# Patient Record
Sex: Female | Born: 1990 | Race: White | Hispanic: No | Marital: Married | Smoking: Never smoker
Health system: Southern US, Community
[De-identification: ages and names within clinical notes are randomized; demographics above are authoritative.]

## PROBLEM LIST (undated history)

## (undated) ENCOUNTER — Inpatient Hospital Stay (HOSPITAL_COMMUNITY): Payer: Self-pay

## (undated) ENCOUNTER — Emergency Department (HOSPITAL_COMMUNITY): Admission: EM | Discharge status: Absent without leave | Payer: Medicaid Other

## (undated) ENCOUNTER — Emergency Department (HOSPITAL_BASED_OUTPATIENT_CLINIC_OR_DEPARTMENT_OTHER): Admission: EM | Payer: Medicaid Other | Source: Home / Self Care

## (undated) DIAGNOSIS — K7460 Unspecified cirrhosis of liver: Secondary | ICD-10-CM

## (undated) DIAGNOSIS — R55 Syncope and collapse: Secondary | ICD-10-CM

## (undated) DIAGNOSIS — K219 Gastro-esophageal reflux disease without esophagitis: Secondary | ICD-10-CM

## (undated) DIAGNOSIS — K760 Fatty (change of) liver, not elsewhere classified: Secondary | ICD-10-CM

## (undated) DIAGNOSIS — E039 Hypothyroidism, unspecified: Secondary | ICD-10-CM

## (undated) DIAGNOSIS — J309 Allergic rhinitis, unspecified: Secondary | ICD-10-CM

## (undated) DIAGNOSIS — K449 Diaphragmatic hernia without obstruction or gangrene: Secondary | ICD-10-CM

## (undated) DIAGNOSIS — D696 Thrombocytopenia, unspecified: Secondary | ICD-10-CM

## (undated) DIAGNOSIS — I8500 Esophageal varices without bleeding: Secondary | ICD-10-CM

## (undated) DIAGNOSIS — K766 Portal hypertension: Secondary | ICD-10-CM

## (undated) DIAGNOSIS — K828 Other specified diseases of gallbladder: Secondary | ICD-10-CM

## (undated) DIAGNOSIS — R161 Splenomegaly, not elsewhere classified: Secondary | ICD-10-CM

## (undated) DIAGNOSIS — K2210 Ulcer of esophagus without bleeding: Secondary | ICD-10-CM

## (undated) DIAGNOSIS — Z8489 Family history of other specified conditions: Secondary | ICD-10-CM

## (undated) DIAGNOSIS — F3181 Bipolar II disorder: Secondary | ICD-10-CM

## (undated) DIAGNOSIS — K319 Disease of stomach and duodenum, unspecified: Secondary | ICD-10-CM

## (undated) DIAGNOSIS — Z8759 Personal history of other complications of pregnancy, childbirth and the puerperium: Secondary | ICD-10-CM

## (undated) DIAGNOSIS — R000 Tachycardia, unspecified: Secondary | ICD-10-CM

## (undated) DIAGNOSIS — K922 Gastrointestinal hemorrhage, unspecified: Secondary | ICD-10-CM

## (undated) DIAGNOSIS — R011 Cardiac murmur, unspecified: Secondary | ICD-10-CM

## (undated) DIAGNOSIS — K635 Polyp of colon: Secondary | ICD-10-CM

## (undated) DIAGNOSIS — K7581 Nonalcoholic steatohepatitis (NASH): Secondary | ICD-10-CM

## (undated) HISTORY — PX: DILATION AND CURETTAGE OF UTERUS: SHX78

## (undated) HISTORY — DX: Cardiac murmur, unspecified: R01.1

## (undated) HISTORY — DX: Thrombocytopenia, unspecified: D69.6

## (undated) HISTORY — DX: Syncope and collapse: R55

## (undated) HISTORY — DX: Ulcer of esophagus without bleeding: K22.10

## (undated) HISTORY — DX: Bipolar II disorder: F31.81

## (undated) HISTORY — DX: Tachycardia, unspecified: R00.0

## (undated) HISTORY — DX: Fatty (change of) liver, not elsewhere classified: K76.0

## (undated) HISTORY — DX: Diaphragmatic hernia without obstruction or gangrene: K44.9

## (undated) HISTORY — DX: Nonalcoholic steatohepatitis (NASH): K75.81

## (undated) HISTORY — DX: Gastrointestinal hemorrhage, unspecified: K92.2

## (undated) HISTORY — DX: Disease of stomach and duodenum, unspecified: K31.9

## (undated) HISTORY — PX: COLONOSCOPY: SHX174

## (undated) HISTORY — DX: Personal history of other complications of pregnancy, childbirth and the puerperium: Z87.59

## (undated) HISTORY — DX: Esophageal varices without bleeding: I85.00

## (undated) HISTORY — DX: Unspecified cirrhosis of liver: K74.60

## (undated) HISTORY — PX: WISDOM TOOTH EXTRACTION: SHX21

## (undated) HISTORY — DX: Splenomegaly, not elsewhere classified: R16.1

## (undated) HISTORY — PX: UPPER GASTROINTESTINAL ENDOSCOPY: SHX188

## (undated) HISTORY — DX: Allergic rhinitis, unspecified: J30.9

## (undated) HISTORY — DX: Polyp of colon: K63.5

## (undated) HISTORY — DX: Other specified diseases of gallbladder: K82.8

## (undated) HISTORY — DX: Portal hypertension: K76.6

## (undated) NOTE — *Deleted (*Deleted)
Patient Care Team: Panosh, Standley Brooking, MD as PCP - General (Internal Medicine) Neldon Mc Donnamarie Poag, MD as Consulting Physician (Allergy and Immunology) Adrian Prows, MD as Consulting Physician (Cardiology) Elayne Snare, MD as Consulting Physician (Endocrinology) Pyrtle, [name], MD as Consulting Physician (Gastroenterology) Neldon Mc Donnamarie Poag, MD as Consulting Physician (Allergy and Immunology)  DIAGNOSIS: No diagnosis found.  CHIEF COMPLIANT: Follow-up of thrombocytopenia and splenomegaly  INTERVAL HISTORY: Linda Becker is a 43 y.o. with above-mentioned history of thrombocytopenia and splenomegaly. She presents to the clinic today for follow-up.   ALLERGIES:  is allergic to progesterone and penicillins.  MEDICATIONS:  Current Outpatient Medications  Medication Sig Dispense Refill  . albuterol (VENTOLIN HFA) 108 (90 Base) MCG/ACT inhaler INHALE 2 PUFFS BY MOUTH EVERY 4 TO 6 HOUS AS NEEDED FOR COUGH OR WHEEZE 18 g 0  . albuterol (PROVENTIL) (2.5 MG/3ML) 0.083% nebulizer solution Take 3 mLs (2.5 mg total) by nebulization every 4 (four) hours as needed for wheezing or shortness of breath.    . ARIPiprazole (ABILIFY) 10 MG tablet Take 10 mg by mouth every morning.    . Ascorbic Acid (VITAMIN C WITH ROSE HIPS) 500 MG tablet Take 500 mg by mouth 2 (two) times daily.    . Beclomethasone Dipropionate (QNASL) 80 MCG/ACT AERS INSTILL 1 SPRAY IN EACH NOSTRIL ONCE DAILY AS DIRECTED (Patient taking differently: Place 2 sprays into the nose daily. ) 31.8 g 1  . calcium carbonate (OSCAL) 1500 (600 Ca) MG TABS tablet Take 600 mg of elemental calcium by mouth 2 (two) times daily with a meal.    . Cetirizine HCl 10 MG CAPS Take 10 mg by mouth 2 (two) times daily.     . dupilumab (DUPIXENT) 300 MG/2ML prefilled syringe Inject 300 mg into the skin every 14 (fourteen) days. 4 mL 11  . ferrous sulfate 325 (65 FE) MG tablet Take 325 mg by mouth daily with breakfast.    . FLUoxetine (PROZAC) 40 MG capsule Take 40 mg by  mouth daily.     Marland Kitchen gabapentin (NEURONTIN) 300 MG capsule Take 300 mg by mouth See admin instructions. Take 300 mg in the morning and afternoon, and 600 mg at bedtime    . lamoTRIgine (LAMICTAL) 150 MG tablet Take 300 mg by mouth daily.     Marland Kitchen levothyroxine (SYNTHROID) 88 MCG tablet Take 1 tablet (88 mcg total) by mouth daily before breakfast.    . LORazepam (ATIVAN) 0.5 MG tablet Take 0.5 mg by mouth at bedtime.    . montelukast (SINGULAIR) 10 MG tablet Take 1 tablet (10 mg total) by mouth daily. 90 tablet 1  . nadolol (CORGARD) 20 MG tablet Take 1 tablet (20 mg total) by mouth daily. 30 tablet 3  . Olopatadine HCl 0.2 % SOLN Can use one drop in each eye once daily if needed. (Patient taking differently: Place 1 drop into both eyes daily as needed (allergies). ) 7.5 mL 1  . ondansetron (ZOFRAN ODT) 8 MG disintegrating tablet Take 1 tablet (8 mg total) by mouth every 8 (eight) hours as needed for nausea or vomiting. 30 tablet 1  . ondansetron (ZOFRAN) 8 MG tablet Take 8 mg by mouth daily as needed for nausea/vomiting.    . pantoprazole (PROTONIX) 40 MG tablet Take 1 tablet (40 mg total) by mouth 2 (two) times daily before a meal. 60 tablet 0  . Prenatal Vit-Fe Fumarate-FA (MULTIVITAMIN-PRENATAL) 27-0.8 MG TABS tablet Take 1 tablet by mouth daily.     Marland Kitchen  promethazine (PHENERGAN) 25 MG tablet Take 1 tablet (25 mg total) by mouth every 6 (six) hours as needed for nausea. 30 tablet 2  . SYMBICORT 160-4.5 MCG/ACT inhaler INHALE 2 PUFFS BY MOUTH TWICE A DAY TO PREVENT COUGH OR WHEEZE. RINSE, GARGLE, AND SPIT AFTER USE. (Patient taking differently: Inhale 2 puffs into the lungs See admin instructions. INHALE 2 PUFFS BY MOUTH TWICE A DAY TO PREVENT COUGH OR WHEEZE. RINSE, GARGLE, AND SPIT AFTER USE.) 30.6 g 1  . topiramate (TOPAMAX) 50 MG tablet Take 50 mg by mouth 2 (two) times daily.    . Vitamin D3 (VITAMIN D) 25 MCG tablet Take 1,000 Units by mouth daily.    . vitamin E 400 UNIT capsule Take 400 Units by  mouth 2 (two) times daily.     No current facility-administered medications for this visit.   Facility-Administered Medications Ordered in Other Visits  Medication Dose Route Frequency Provider Last Rate Last Admin  . promethazine (PHENERGAN) tablet 12.5-25 mg  12.5-25 mg Oral Q4H PRN Clovis Riley, MD       Or  . promethazine (PHENERGAN) suppository 12.5-25 mg  12.5-25 mg Rectal Q4H PRN Romana Juniper A, MD      . sodium chloride 0.9 % 1,000 mL with thiamine 758 mg, folic acid 1 mg, multivitamins adult 10 mL infusion   Intravenous Continuous Clovis Riley, MD        PHYSICAL EXAMINATION: ECOG PERFORMANCE STATUS: {CHL ONC ECOG IT:[id number]}  There were no vitals filed for this visit. There were no vitals filed for this visit.  LABORATORY DATA:  I have reviewed the data as listed CMP Latest Ref Rng & Units 04/03/2020 02/24/2020 02/07/2020  Glucose 70 - 99 mg/dL 141(H) 101(H) 112(H)  BUN 6 - 23 mg/dL 5(L) 8 9  Creatinine 0.40 - 1.20 mg/dL 0.70 0.93 0.84  Sodium 135 - 145 mEq/L 141 142 140  Potassium 3.5 - 5.1 mEq/L 3.6 4.1 4.4  Chloride 96 - 112 mEq/L 112 109 111  CO2 19 - 32 mEq/L 23 27 23   Calcium 8.4 - 10.5 mg/dL 8.7 8.9 8.9  Total Protein 6.0 - 8.3 g/dL 6.1 6.7 5.9(L)  Total Bilirubin 0.2 - 1.2 mg/dL 0.5 0.4 0.3  Alkaline Phos 39 - 117 U/L 90 98 129(H)  AST 0 - 37 U/L 19 26 31   ALT 0 - 35 U/L 26 31 47(H)    Lab Results  Component Value Date   WBC 3.3 (L) 04/03/2020   HGB 12.8 04/03/2020   HCT 38.8 04/03/2020   MCV 95.0 04/03/2020   PLT 59.0 (L) 04/03/2020   NEUTROABS 2.0 04/03/2020    ASSESSMENT & PLAN:  No problem-specific Assessment & Plan notes found for this encounter.    No orders of the defined types were placed in this encounter.  The patient has a good understanding of the overall plan. she agrees with it. she will call with any problems that may develop before the next visit here.  Total time spent: *** mins including face to face time and  time spent for planning, charting and coordination of care  Nicholas Lose, MD 04/23/2020  I, Cloyde Reams Dorshimer, am acting as scribe for Dr. Nicholas Lose.  {insert scribe attestation}

---
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
## 2014-06-07 ENCOUNTER — Ambulatory Visit: Payer: Medicaid Other | Admitting: Dietician

## 2014-06-07 ENCOUNTER — Emergency Department (HOSPITAL_COMMUNITY)
Admission: EM | Admit: 2014-06-07 | Discharge: 2014-06-07 | Disposition: A | Discharge status: Home / Self Care | Payer: 59 | Attending: Emergency Medicine | Admitting: Emergency Medicine

## 2014-06-07 ENCOUNTER — Encounter (HOSPITAL_COMMUNITY): Payer: Self-pay

## 2014-06-07 ENCOUNTER — Emergency Department (HOSPITAL_COMMUNITY): Payer: 59

## 2014-06-07 DIAGNOSIS — Z79899 Other long term (current) drug therapy: Secondary | ICD-10-CM | POA: Insufficient documentation

## 2014-06-07 DIAGNOSIS — Y9301 Activity, walking, marching and hiking: Secondary | ICD-10-CM | POA: Insufficient documentation

## 2014-06-07 DIAGNOSIS — J45909 Unspecified asthma, uncomplicated: Secondary | ICD-10-CM | POA: Insufficient documentation

## 2014-06-07 DIAGNOSIS — S90415A Abrasion, left lesser toe(s), initial encounter: Secondary | ICD-10-CM | POA: Diagnosis not present

## 2014-06-07 DIAGNOSIS — G43909 Migraine, unspecified, not intractable, without status migrainosus: Secondary | ICD-10-CM | POA: Insufficient documentation

## 2014-06-07 DIAGNOSIS — K219 Gastro-esophageal reflux disease without esophagitis: Secondary | ICD-10-CM | POA: Diagnosis not present

## 2014-06-07 DIAGNOSIS — Z7951 Long term (current) use of inhaled steroids: Secondary | ICD-10-CM | POA: Insufficient documentation

## 2014-06-07 DIAGNOSIS — S79912A Unspecified injury of left hip, initial encounter: Secondary | ICD-10-CM | POA: Insufficient documentation

## 2014-06-07 DIAGNOSIS — Y92481 Parking lot as the place of occurrence of the external cause: Secondary | ICD-10-CM | POA: Diagnosis not present

## 2014-06-07 DIAGNOSIS — Y998 Other external cause status: Secondary | ICD-10-CM | POA: Insufficient documentation

## 2014-06-07 DIAGNOSIS — Z8781 Personal history of (healed) traumatic fracture: Secondary | ICD-10-CM | POA: Insufficient documentation

## 2014-06-07 DIAGNOSIS — S3992XA Unspecified injury of lower back, initial encounter: Secondary | ICD-10-CM | POA: Insufficient documentation

## 2014-06-07 MED ORDER — HYDROCODONE-ACETAMINOPHEN 5-325 MG PO TABS: 2.0000 | ORAL_TABLET | ORAL | Status: DC | PRN

## 2014-06-07 MED ORDER — HYDROCODONE-ACETAMINOPHEN 5-325 MG PO TABS
2.0000 | ORAL_TABLET | Freq: Once | ORAL | Status: AC
  Administered 2014-06-07: 2 via ORAL
  Filled 2014-06-07: qty 2
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
## 2014-06-07 NOTE — ED Notes (Signed)
Patient transported to X-ray 
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
## 2015-03-17 ENCOUNTER — Telehealth: Payer: Self-pay | Admitting: Family

## 2015-03-17 DIAGNOSIS — J00 Acute nasopharyngitis [common cold]: Secondary | ICD-10-CM

## 2015-03-17 DIAGNOSIS — B9689 Other specified bacterial agents as the cause of diseases classified elsewhere: Secondary | ICD-10-CM

## 2015-03-17 DIAGNOSIS — J208 Acute bronchitis due to other specified organisms: Principal | ICD-10-CM

## 2015-03-17 MED ORDER — BENZONATATE 100 MG PO CAPS: 100.0000 mg | ORAL_CAPSULE | Freq: Two times a day (BID) | ORAL | Status: DC | PRN

## 2015-03-17 MED ORDER — AZITHROMYCIN 250 MG PO TABS: ORAL_TABLET | ORAL | Status: DC

## 2015-03-17 NOTE — Progress Notes (Signed)
We are sorry that you are not feeling well.  Here is how we plan to help!  Based on what you have shared with me it looks like you have upper respiratory tract inflammation that has resulted in a significant cough.  Inflammation and infection in the upper respiratory tract is commonly called bronchitis and has four common causes:  Allergies, Viral Infections, Acid Reflux and Bacterial Infections.  Allergies, viruses and acid reflux are treated by controlling symptoms or eliminating the cause. An example might be a cough caused by taking certain blood pressure medications. You stop the cough by changing the medication. Another example might be a cough caused by acid reflux. Controlling the reflux helps control the cough.  Based on your presentation I believe you most likely have A cough due to bacteria.  When patients have a fever and a productive cough with a change in color or increased sputum production, we are concerned about bacterial bronchitis.  If left untreated it can progress to pneumonia.  If your symptoms do not improve with your treatment plan it is important that you contact your provider.   I have prescribed Azithromyin 250 mg: two tables now and then one tablet daily for 4 additonal days   In addition you may use A non-prescription cough medication called Robitussin DAC. Take 2 teaspoons every 8 hours or Delsym: take 2 teaspoons every 12 hours. and A prescription cough medication called Tessalon Perles 161m. You may take 1-2 capsules every 8 hours as needed for your cough.    HOME CARE . Only take medications as instructed by your medical team. . Complete the entire course of an antibiotic. . Drink plenty of fluids and get plenty of rest. . Avoid close contacts especially the very young and the elderly . Cover your mouth if you cough or cough into your sleeve. . Always remember to wash your hands . A steam or ultrasonic humidifier can help congestion.    GET HELP RIGHT AWAY  IF: . You develop worsening fever. . You become short of breath . You cough up blood. . Your symptoms persist after you have completed your treatment plan MAKE SURE YOU   Understand these instructions.  Will watch your condition.  Will get help right away if you are not doing well or get worse.  Your e-visit answers were reviewed by a board certified advanced clinical practitioner to complete your personal care plan.  Depending on the condition, your plan could have included both over the counter or prescription medications. If there is a problem please reply  once you have received a response from your provider. Your safety is important to [place]  If you have drug allergies check your prescription carefully.    You can use MyChart to ask questions about today's visit, request a non-urgent call back, or ask for a work or school excuse for 24 hours related to this e-Visit. If it has been greater than 24 hours you will need to follow up with your provider, or enter a new e-Visit to address those concerns. You will get an e-mail in the next two days asking about your experience.  I hope that your e-visit has been valuable and will speed your recovery. Thank you for using e-visits.

## 2015-03-29 ENCOUNTER — Other Ambulatory Visit: Payer: Self-pay | Admitting: Allergy and Immunology

## 2015-03-29 NOTE — Telephone Encounter (Signed)
Denied Ventolin HFA.  Was refilled on 12/27/2014 and told needs an office visit.  This was entered in our old office [place].

## 2015-04-18 ENCOUNTER — Ambulatory Visit (INDEPENDENT_AMBULATORY_CARE_PROVIDER_SITE_OTHER): Payer: 59 | Admitting: Allergy and Immunology

## 2015-04-18 ENCOUNTER — Encounter: Payer: Self-pay | Admitting: Allergy and Immunology

## 2015-04-18 VITALS — BP 104/78 | HR 88 | Resp 20 | Ht 66.14 in | Wt 239.4 lb

## 2015-04-18 DIAGNOSIS — J309 Allergic rhinitis, unspecified: Secondary | ICD-10-CM | POA: Diagnosis not present

## 2015-04-18 DIAGNOSIS — I499 Cardiac arrhythmia, unspecified: Secondary | ICD-10-CM | POA: Diagnosis not present

## 2015-04-18 DIAGNOSIS — H1010 Acute atopic conjunctivitis, unspecified eye: Secondary | ICD-10-CM

## 2015-04-18 DIAGNOSIS — K219 Gastro-esophageal reflux disease without esophagitis: Secondary | ICD-10-CM

## 2015-04-18 DIAGNOSIS — J4530 Mild persistent asthma, uncomplicated: Secondary | ICD-10-CM

## 2015-04-18 MED ORDER — MONTELUKAST SODIUM 10 MG PO TABS: 10.0000 mg | ORAL_TABLET | Freq: Every day | ORAL | Status: DC

## 2015-04-18 MED ORDER — ALBUTEROL SULFATE HFA 108 (90 BASE) MCG/ACT IN AERS: INHALATION_SPRAY | RESPIRATORY_TRACT | Status: DC

## 2015-04-18 MED ORDER — MOMETASONE FUROATE 50 MCG/ACT NA SUSP: NASAL | Status: DC

## 2015-04-18 MED ORDER — ESOMEPRAZOLE MAGNESIUM 40 MG PO CPDR: 40.0000 mg | DELAYED_RELEASE_CAPSULE | Freq: Every day | ORAL | Status: DC

## 2015-04-18 MED ORDER — FLUTICASONE PROPIONATE (INHAL) 250 MCG/BLIST IN AEPB: INHALATION_SPRAY | RESPIRATORY_TRACT | Status: DC

## 2015-04-18 NOTE — Progress Notes (Signed)
[facility]  Follow-up Note  Refering Provider: Shon Baton, MD Primary Provider: Precious Reel, MD  Subjective:   Linda Becker is a 24 y.o. female who returns to the [facility] [employer] in re-evaluation of the following:  HPI Comments:  [name] returns to this clinic stating that she's done quite well over the course of the past year regarding her asthma and allergic rhinoconjunctivitis. She is not had an exacerbation of her asthma requiring her to get systemic steroids. She rarely uses a short acting bronchodilator as long she continues on Advair 250/50 one inhalation twice a day and singular 10 mg daily. Her nose is been doing well on occasional Nasonex. She did receive the flu vaccine.  [name] is undergoing evaluation for some type of arrhythmia. Apparently she's had episodes of chest pain and fast heart rate and shortness of breath and near syncope. She is having a heart monitor placed. She drinks 3 cups of coffee per day and tea throughout the day.   Outpatient Encounter Prescriptions as of 04/18/2015  Medication Sig  . albuterol (PROAIR HFA) 108 (90 BASE) MCG/ACT inhaler Inhale two puffs every four to six hours as needed for cough or wheeze.  Marland Kitchen albuterol (PROVENTIL) (2.5 MG/3ML) 0.083% nebulizer solution Take 2.5 mg by nebulization every 6 (six) hours as needed for wheezing or shortness of breath.  . cetirizine (ZYRTEC) 10 MG tablet Take 10 mg by mouth daily.  Marland Kitchen esomeprazole (NEXIUM) 40 MG capsule Take 40 mg by mouth daily at 12 noon.  . Fluticasone-Salmeterol (ADVAIR) 250-50 MCG/DOSE AEPB Inhale 1 puff into the lungs every 12 (twelve) hours.  . mometasone (NASONEX) 50 MCG/ACT nasal spray Place 2 sprays into the nose as needed.  . montelukast (SINGULAIR) 10 MG tablet Take 10 mg by mouth at bedtime.  . Multiple Vitamin (MULTIVITAMIN) tablet Take 1 tablet by mouth daily.  . Olopatadine HCl (PATADAY) 0.2 % SOLN  Apply 1 drop to eye 2 (two) times daily.  Marland Kitchen topiramate (TOPAMAX) 25 MG tablet take 1 in the a.m, 3 at night. (Patient taking differently: Take 50-75 mg by mouth 2 (two) times daily. Take 2 in the a.m, 3 at night.)  . Vitamin D, Ergocalciferol, (DRISDOL) 50000 UNITS CAPS capsule 2 (two) times a week.  . [DISCONTINUED] albuterol (PROVENTIL HFA;VENTOLIN HFA) 108 (90 BASE) MCG/ACT inhaler Inhale 2 puffs into the lungs every 4 (four) hours as needed. For wheezing  . azithromycin (ZITHROMAX) 250 MG tablet Take 2 tablets now, then 1 tablet daily x 4 days (Patient not taking: Reported on 04/18/2015)  . benzonatate (TESSALON) 100 MG capsule Take 1-2 capsules (100-200 mg total) by mouth 2 (two) times daily as needed for cough. (Patient not taking: Reported on 04/18/2015)  . Biotin (BIOTIN MAXIMUM STRENGTH) 10 MG TABS Take 1 tablet by mouth 2 (two) times daily.  Marland Kitchen dexamethasone (DECADRON) 2 MG tablet take 2 twice a day for 4 days, then 1 twice a day for 4 days then stop. (Patient not taking: Reported on 06/07/2014)  . HYDROcodone-acetaminophen (NORCO/VICODIN) 5-325 MG per tablet Take 2 tablets by mouth every 4 (four) hours as needed for moderate pain. (Patient not taking: Reported on 04/18/2015)  . ibuprofen (ADVIL,MOTRIN) 200 MG tablet Take 600-800 mg by mouth every 6 (six) hours as needed. For pain  . medroxyPROGESTERone (DEPO-PROVERA) 150 MG/ML injection Inject 150 mg into the muscle every 3 (three) months.  . predniSONE (DELTASONE) 10 MG tablet 6,5,4,3,2,1 taper (Patient  not taking: Reported on 06/07/2014)  . [DISCONTINUED] rizatriptan (MAXALT-MLT) 5 MG disintegrating tablet Take 1 tablet (5 mg total) by mouth as needed for migraine. Take one at the onset of the HA. May repeat in 2 hours if needed.   No facility-administered encounter medications on file as of 04/18/2015.    No orders of the defined types were placed in this encounter.    Past Medical History  Diagnosis Date  . Asthma   . GERD  (gastroesophageal reflux disease)   . Migraine     Past Surgical History  Procedure Laterality Date  . Broken right femur  2008    rod placed  . Tonsillectomy  2006    No Known Allergies  Review of Systems  Constitutional: Negative for fever and chills.  HENT: Negative for congestion, ear pain, facial swelling, nosebleeds, postnasal drip, rhinorrhea, sinus pressure, sneezing, sore throat, tinnitus, trouble swallowing and voice change.   Eyes: Negative for pain, discharge, redness and itching.  Respiratory: Negative for cough, choking, chest tightness, shortness of breath, wheezing and stridor.   Cardiovascular: Positive for chest pain. Negative for leg swelling.  Gastrointestinal: Negative for nausea, vomiting and abdominal pain.  Endocrine: Negative for cold intolerance and heat intolerance.  Genitourinary: Negative for difficulty urinating.  Musculoskeletal: Negative for myalgias and arthralgias.  Allergic/Immunologic: Negative.   Neurological: Negative for dizziness.  Hematological: Negative for adenopathy.     Objective:   Filed Vitals:   04/18/15 0917  BP: 104/78  Pulse: 88  Resp: 20   Height: 5' 6.14" (168 cm)  Weight: 239 lb 6.7 oz (108.6 kg)   Physical Exam  Constitutional: She appears well-developed and well-nourished. No distress.  HENT:  Head: Normocephalic and atraumatic. Head is without right periorbital erythema and without left periorbital erythema.  Right Ear: Tympanic membrane, external ear and ear canal normal. No drainage or tenderness. No foreign bodies. Tympanic membrane is not injected, not scarred, not perforated, not erythematous, not retracted and not bulging. No middle ear effusion.  Left Ear: Tympanic membrane, external ear and ear canal normal. No drainage or tenderness. No foreign bodies. Tympanic membrane is not injected, not scarred, not perforated, not erythematous, not retracted and not bulging.  No middle ear effusion.  Nose: Nose  normal. No mucosal edema, rhinorrhea, nose lacerations or sinus tenderness.  No foreign bodies.  Mouth/Throat: Oropharynx is clear and moist. No oropharyngeal exudate, posterior oropharyngeal edema, posterior oropharyngeal erythema or tonsillar abscesses.  Eyes: Lids are normal. Right eye exhibits no chemosis, no discharge and no exudate. No foreign body present in the right eye. Left eye exhibits no chemosis, no discharge and no exudate. No foreign body present in the left eye. Right conjunctiva is not injected. Left conjunctiva is not injected.  Neck: Neck supple. No tracheal tenderness present. No tracheal deviation and no edema present. No thyroid mass and no thyromegaly present.  Cardiovascular: Normal rate, regular rhythm, S1 normal and S2 normal.  Exam reveals no gallop.   No murmur heard. Pulmonary/Chest: No accessory muscle usage or stridor. No respiratory distress. She has no wheezes. She has no rhonchi. She has no rales.  Abdominal: Soft. There is no hepatosplenomegaly. There is no tenderness. There is no rigidity, no rebound and no guarding.  Lymphadenopathy:       Head (right side): No tonsillar adenopathy present.       Head (left side): No tonsillar adenopathy present.    She has no cervical adenopathy.  Neurological: She is alert.  Skin:  No rash noted. She is not diaphoretic.  Psychiatric: She has a normal mood and affect. Her behavior is normal.    Diagnostics:    Spirometry was performed and demonstrated an FEV1 of 3.67 at 110 % of predicted.  The patient had an Asthma Control Test with the following results: ACT Total Score: 19.    Assessment and Plan:   1. Mild persistent asthma, uncomplicated   2. Allergic rhinoconjunctivitis   3. Gastroesophageal reflux disease, esophagitis presence not specified   4. Cardiac arrhythmia, unspecified cardiac arrhythmia type      1. Change Advair to Flovent 250 one inhalation two times per day  2. Taper down caffeine and  chocolate slowly- aim for least amount possible  3. Continue montelukast 30m one tablet one time per day  4. Continue Nexium 40 mg one tablet one time per day  5. Continue Nasonex 1-2 sprays each nostril one time per day  6. Continue ProAir HFA 2 inhalations every 4-6 hours of needed.  7. Return in one year or earlier if problem.   I'm going to remove Laryssa's long-acting bronchodilator and have her taper down her caffeine consumption given the fact that she is being evaluated for an arrhythmia that may be associated with syncope. Hopefully she'll do well using Flovent instead of Advair. She will keep in contact with me noting her response to this treatment. If she does well I'll see her back in this clinic in 1 year or earlier if there is a problem.   EAllena Katz MD [facility]

## 2015-04-18 NOTE — Patient Instructions (Signed)
  1. Change Advair to Flovent 250 one inhalation two times per day  2. Taper down caffeine and chocolate slowly- aim for least amount possible  3. Continue montelukast 69m one tablet one time per day  4. Continue Nexium 40 mg one tablet one time per day  5. Continue Nasonex 1-2 sprays each nostril one time per day  6. Continue ProAir HFA 2 inhalations every 4-6 hours of needed.  7. Return in one year or earlier if problem.

## 2015-06-18 MED FILL — MONTELUKAST SOD 10 MG TAB: 10 | 30 days supply | Qty: 30 | Fill #1

## 2015-06-18 MED FILL — VENTOLIN HFA 90 MCG INHALER: 108 (90 BAS | 25 days supply | Qty: 18 | Fill #1

## 2015-06-18 MED FILL — FLOVENT 250 MCG DISKUS: 250 | 30 days supply | Qty: 60 | Fill #0

## 2015-07-04 MED FILL — HYDROCODON-APAP 5-325: 5-325 | 2 days supply | Qty: 20 | Fill #0

## 2015-07-06 MED FILL — AMOXICILLIN 500 MG CAPSULE: 500 | 7 days supply | Qty: 28 | Fill #0

## 2015-07-06 MED FILL — HYDROCODON-APAP 5-325: 5-325 | 2 days supply | Qty: 20 | Fill #0

## 2015-07-09 MED FILL — CHLORHEXIDINE 0.12% RINSE: 0.12 | 17 days supply | Qty: 473 | Fill #0

## 2015-07-09 MED FILL — HYDROCODON-APAP 5-325: 5-325 | 2 days supply | Qty: 20 | Fill #0

## 2015-07-09 MED FILL — CLINDAMYCIN HCL 150 MG CAPS: 150 | 6 days supply | Qty: 24 | Fill #0

## 2015-07-11 MED FILL — VENTOLIN HFA 90 MCG INHALER: 108 (90 BAS | 21 days supply | Qty: 18 | Fill #2

## 2015-07-25 MED FILL — FLOVENT 250 MCG DISKUS: 250 | 30 days supply | Qty: 60 | Fill #1

## 2015-07-25 MED FILL — VENTOLIN HFA 90 MCG INHALER: 108 (90 BAS | 21 days supply | Qty: 18 | Fill #3

## 2015-07-25 MED FILL — MONTELUKAST SOD 10 MG TAB: 10 | 90 days supply | Qty: 90 | Fill #2

## 2015-08-15 DIAGNOSIS — J45901 Unspecified asthma with (acute) exacerbation: Secondary | ICD-10-CM | POA: Diagnosis not present

## 2015-08-15 DIAGNOSIS — J40 Bronchitis, not specified as acute or chronic: Secondary | ICD-10-CM | POA: Diagnosis not present

## 2015-08-21 ENCOUNTER — Other Ambulatory Visit: Payer: Self-pay | Admitting: Allergy and Immunology

## 2015-08-21 MED FILL — VENTOLIN HFA 90 MCG INHALER: 108 (90 BAS | 21 days supply | Qty: 18 | Fill #0

## 2015-08-21 NOTE — Progress Notes (Signed)
Chief Complaint  Patient presents with  . Establish Care    HPI: Patient  Linda Becker  25 y.o. comes in today fornew patient Health Care visit  Previous care :\Dr Catha Gosselin medical :   Pass  Out  At times  ? If related  To  advair  So changed to flovent  And not as good for asthma  Saw dr Einar Gip  And did heart monitor.   No dx sinius tachy   About  Every 3 months  . No restriction... .   initally dec driving.  Not now  goes to school   Hx of low bp.  About once a twice a month   Lays down.   gerd since young   Taking nexium .  Supplement .     Just got rx for poss early pna resp infection with z pack and prednisone  Just finished today  No fever some congestion not allergic to antibiotics? No c xray Health Maintenance  Topic Date Due  . CHLAMYDIA SCREENING  11/10/2007  . HIV Screening  11/10/2007  . TETANUS/TDAP  11/10/2011  . PAP SMEAR  11/09/2013  . INFLUENZA VACCINE  01/15/2015   Health Maintenance Review LIFESTYLE:  Exercise:  Time no Tobacco/ETS: no Alcohol: per day  ocass rare Sugar beverages:  Lots of sodas and sweet .   4-5 sugar  With caffiene.  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    Mom cva ht   Both parents  Have diabetes g1 p0    Used to see [facility]  With [facility] on depoprovera   8 weeks loss . March 29    Periods irreg and heavy . Appt on march 16th .for new gyne.S [facility]   ROS:  GEN/ HEENT: No fever, significant weight changes sweats gets headaches vision problems hearing changes, CV/ PULM; No chest pain shortness of breath cough, syncope as above ,no,edema  change in exercise tolerance. GI /GU: No adominal pain, vomiting, change in bowel habits. No blood in the stool. No significant GU symptoms. SKIN/HEME: ,no acute skin rashes suspicious lesions or bleeding. No lymphadenopathy, nodules, masses.  NEURO/ PSYCH:  No neurologic signs such as  weakness numbness. No depression anxiety. IMM/ Allergy: No unusual infections.  Allergy .   REST of 12 system review negative except as per HPI   Past Medical History  Diagnosis Date  . Asthma     since childhood  on controller meds extrinsic dr Carmelina Peal  . GERD (gastroesophageal reflux disease)     on nexium  for  long term sx since childhood  . Migraine   . Tachycardia     episodes with near syncope eval dr Nadyne Coombes  on no meds dced LABA  . Murmur     pt reports MVP  . Syncope     under eval ? cause  . H/O miscarriage, not currently pregnant     [redacted] weeks  march 2016  . Allergic rhinitis     remote hx of allergy shots    Past Surgical History  Procedure Laterality Date  . Broken right femur  2008    rod placed  . Tonsillectomy  2006  . Wisdom tooth extraction      Family History  Problem Relation Age of Onset  . Asthma Father   . Arthritis Mother   .  Arthritis Father   . Arthritis Maternal Grandmother   . Colon cancer Paternal Grandfather   . Breast cancer Maternal Grandmother   . Breast cancer Paternal Grandmother   . Breast cancer Paternal Aunt   . Breast cancer Paternal Aunt   . Hyperlipidemia Father   . Hyperlipidemia Mother   . Heart disease Mother   . Heart disease Father   . Hypertension Mother   . Hypertension Father   . Diabetes Mother   . Diabetes Father   . Diabetes Maternal Grandfather   . Diabetes Paternal Grandmother   . Diabetes Paternal Grandfather     Social History   Social History  . Marital Status: Legally Separated    Spouse Name: N/A  . Number of Children: N/A  . Years of Education: N/A   Social History Main Topics  . Smoking status: Never Smoker   . Smokeless tobacco: Never Used  . Alcohol Use: 0.0 oz/week    0 Standard drinks or equivalent per week  . Drug Use: No  . Sexual Activity: No   Other Topics Concern  . 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 albuterol (PROVENTIL) (2.5 MG/3ML) 0.083% nebulizer solution Take 2.5 mg by nebulization every 6 (six) hours as needed for wheezing or shortness of breath.    . Biotin (BIOTIN MAXIMUM STRENGTH) 10 MG TABS Take 1 tablet by mouth 2 (two) times daily.    . cetirizine (ZYRTEC) 10 MG tablet Take 10 mg by mouth daily.    Marland Kitchen esomeprazole (NEXIUM) 40 MG capsule Take 1 capsule (40 mg total) by mouth daily. 30 capsule 11  . Fluticasone Propionate, Inhal, (FLOVENT DISKUS) 250 MCG/BLIST AEPB Inhale one dose twice a day.  Rinse, gargle, and spit after use. 60 each 11  . ibuprofen (ADVIL,MOTRIN) 200 MG tablet Take 600-800 mg by mouth every 6 (six) hours as needed. For pain    . mometasone (NASONEX) 50 MCG/ACT nasal spray Use one to two sprays in each nostril once daily. 17 g 11  . montelukast (SINGULAIR) 10 MG tablet Take 1 tablet (10 mg total) by mouth daily. 30 tablet 11  . Multiple Vitamin (MULTIVITAMIN) tablet Take 1 tablet by mouth daily.    . Olopatadine HCl (PATADAY) 0.2 % SOLN Apply 1 drop to eye 2 (two) times daily.    . VENTOLIN HFA 108 (90 Base) MCG/ACT inhaler INHALE 2 PUFFS BY MOUTH EVERY 4-6 HOURS AS NEEDED FOR COUGH OR WHEEZE 18 g 1  . Vitamin D, Ergocalciferol, (DRISDOL) 50000 UNITS CAPS capsule 2 (two) times a week.  1  . azithromycin (ZITHROMAX) 250 MG tablet Take 2 tablets now, then 1 tablet daily x 4 days (Patient not taking: Reported on 04/18/2015) 6 tablet 0  . benzonatate (TESSALON) 100 MG capsule Take 1-2 capsules (100-200 mg total) by mouth 2 (two) times daily as needed for cough. (Patient not taking: Reported on 04/18/2015) 30 capsule 0  . dexamethasone (DECADRON) 2 MG tablet take 2 twice a day for 4 days, then 1 twice a day for 4 days then stop. (Patient not taking: Reported on  06/07/2014) 24  tablet 0  . Fluticasone-Salmeterol (ADVAIR) 250-50 MCG/DOSE AEPB Inhale 1 puff into the lungs every 12 (twelve) hours.    Marland Kitchen HYDROcodone-acetaminophen (NORCO/VICODIN) 5-325 MG per tablet Take 2 tablets by mouth every 4 (four) hours as needed for moderate pain. (Patient not taking: Reported on 04/18/2015) 20 tablet 0  . medroxyPROGESTERone (DEPO-PROVERA) 150 MG/ML injection Inject 150 mg into the muscle every 3 (three) months.    . predniSONE (DELTASONE) 10 MG tablet 6,5,4,3,2,1 taper (Patient not taking: Reported on 06/07/2014) 21 tablet 0  . topiramate (TOPAMAX) 25 MG tablet take 1 in the a.m, 3 at night. (Patient taking differently: Take 50-75 mg by mouth 2 (two) times daily. Take 2 in the a.m, 3 at night.) 120 tablet 3   No facility-administered medications prior to visit.     EXAM:  BP 106/80 mmHg  Temp(Src) 97.6 F (36.4 C) (Oral)  Ht 5' 5.5" (1.664 m)  Wt 256 lb (116.121 kg)  BMI 41.94 kg/m2  Body mass index is 41.94 kg/(m^2).  Physical Exam: Vital signs reviewed AOZ:HYQM is a well-developed well-nourished alert cooperative    who appearsr stated age in no acute distress.  congested  No cough  Pleasant nontoxic looks tired   Central obesity  No stria obvious HEENT: normocephalic atraumatic , Eyes: PERRL EOM's full, conjunctiva clear, Nares: stuffy  no deformity discharge or tenderness., Ears: no deformity EAC's clear TMs with normal landmarks. Mouth: clear OP, no lesions, edema.  Moist mucous membranes. Dentition in adequate repair. NECK: supple without masses, thyromegaly or bruits. CHEST/PULM:  Clear to auscultation and percussion breath sounds equal no wheeze , rales or rhonchi. CV: PMI is nondisplaced, S1 S2 no gallops, murmurs, rubs. Peripheral pulses are full without delay..  ABDOMEN: Bowel sounds normal nontender  No guard or rebound, no hepato splenomegal no CVA tenderness.   Extremtities:  No clubbing cyanosis or edema, no acute joint swelling or redness no  focal atrophy NEURO:  Oriented x3, cranial nerves 3-12 appear to be intact, no obvious focal weakness,gait within normal limits  SKIN: No acute rashes normal turgor, color, no bruising or petechiae. PSYCH: Oriented, good eye contact, no obvious depression anxiety, cognition and judgment appear normal. LN: no cervical  adenopathy  No results found for: WBC, HGB, HCT, PLT, GLUCOSE, CHOL, TRIG, HDL, LDLDIRECT, LDLCALC, ALT, AST, NA, K, CL, CREATININE, BUN, CO2, TSH, PSA, INR, GLUF, HGBA1C, MICROALBUR  ASSESSMENT AND PLAN:  Discussed the following assessment and plan:  Tachycardia - Plan: Basic metabolic panel, CBC with Differential/Platelet, Hemoglobin A1c, Hepatic function panel, Lipid panel, TSH, T4, free, Insulin, fasting  Family history of diabetes mellitus (DM) - both parents and other relatives - Plan: Basic metabolic panel, CBC with Differential/Platelet, Hemoglobin A1c, Hepatic function panel, Lipid panel, TSH, T4, free, Insulin, fasting  Gastroesophageal reflux disease, esophagitis presence not specified - on ppi would hope to have her get off in future for metabolic  reasong  will disc in future visits - Plan: Basic metabolic panel, CBC with Differential/Platelet, Hemoglobin A1c, Hepatic function panel, Lipid panel, TSH, T4, free, Insulin, fasting  Syncope, unspecified syncope type - Plan: Basic metabolic panel, CBC with Differential/Platelet, Hemoglobin A1c, Hepatic function panel, Lipid panel, TSH, T4, free, Insulin, fasting  Allergic rhinitis, unspecified allergic rhinitis type  Persistent asthma with undetermined severity - Plan: Basic metabolic panel, CBC with Differential/Platelet, Hemoglobin A1c, Hepatic function panel, Lipid panel, TSH, T4, free, Insulin, fasting  Encounter to establish care  BMI 40.0-44.9, adult ([place]) - Plan: Basic metabolic panel, CBC  with Differential/Platelet, Hemoglobin A1c, Hepatic function panel, Lipid panel, TSH, T4, free, Insulin, fasting Strong  family hx dm and has high sugar drink load as well as caffeine cause of  Work school uncertain if not sleep deprived although was sick recently. Plan fasting labs  She is to dec her sugar beverages  Intensify lifestyle interventions. To help with the majority of her above conditions .  Get records   Cardiology .   Recurrent syncope  And light headed is problematic but she will lay down when feels it coming  No injury reported  Patient Care Team: Burnis Medin, MD as PCP - General (Internal Medicine) Jiles Prows, MD as Consulting Physician (Allergy and Immunology) Patient Instructions  Get records   from dr Einar Gip.cardiologist .    For our records .  And gyne callahan.  Try to get better blocks of sleep .  Decrease or elimated sugar drinks     caffiene  in  Moderation Get appt for fasting labs  I will put in orders .  Plan fu depending on results and how your are doing.       Food Choices for Gastroesophageal Reflux Disease, Adult When you have gastroesophageal reflux disease (GERD), the foods you eat and your eating habits are very important. Choosing the right foods can help ease the discomfort of GERD. WHAT GENERAL GUIDELINES DO I NEED TO FOLLOW?  Choose fruits, vegetables, whole grains, low-fat dairy products, and low-fat meat, fish, and poultry.  Limit fats such as oils, salad dressings, butter, nuts, and avocado.  Keep a food diary to identify foods that cause symptoms.  Avoid foods that cause reflux. These may be different for different people.  Eat frequent small meals instead of three large meals each day.  Eat your meals slowly, in a relaxed setting.  Limit fried foods.  Cook foods using methods other than frying.  Avoid drinking alcohol.  Avoid drinking large amounts of liquids with your meals.  Avoid bending over or lying down until 2-3 hours after eating. WHAT FOODS ARE NOT RECOMMENDED? The following are some foods and drinks that may worsen your  symptoms: Vegetables Tomatoes. Tomato juice. Tomato and spaghetti sauce. Chili peppers. Onion and garlic. Horseradish. Fruits Oranges, grapefruit, and lemon (fruit and juice). Meats High-fat meats, fish, and poultry. This includes hot dogs, ribs, ham, sausage, salami, and bacon. Dairy Whole milk and chocolate milk. Sour cream. Cream. Butter. Ice cream. Cream cheese.  Beverages Coffee and tea, with or without caffeine. Carbonated beverages or energy drinks. Condiments Hot sauce. Barbecue sauce.  Sweets/Desserts Chocolate and cocoa. Donuts. Peppermint and spearmint. Fats and Oils High-fat foods, including [place] fries and potato chips. Other Vinegar. Strong spices, such as black pepper, white pepper, red pepper, cayenne, curry powder, cloves, ginger, and chili powder. The items listed above may not be a complete list of foods and beverages to avoid. Contact your dietitian for more information.   This information is not intended to replace advice given to you by your health care provider. Make sure you discuss any questions you have with your health care provider.   Document Released: 06/02/2005 Document Revised: 06/23/2014 Document Reviewed: 04/06/2013 Elsevier Interactive Patient Education 2016 [place] K. Delphine Sizemore M.D.

## 2015-08-22 ENCOUNTER — Encounter: Payer: Self-pay | Admitting: Internal Medicine

## 2015-08-22 ENCOUNTER — Ambulatory Visit (INDEPENDENT_AMBULATORY_CARE_PROVIDER_SITE_OTHER): Payer: 59 | Admitting: Internal Medicine

## 2015-08-22 VITALS — BP 106/80 | Temp 97.6°F | Ht 65.5 in | Wt 256.0 lb

## 2015-08-22 DIAGNOSIS — Z7689 Persons encountering health services in other specified circumstances: Secondary | ICD-10-CM

## 2015-08-22 DIAGNOSIS — Z833 Family history of diabetes mellitus: Secondary | ICD-10-CM

## 2015-08-22 DIAGNOSIS — K219 Gastro-esophageal reflux disease without esophagitis: Secondary | ICD-10-CM

## 2015-08-22 DIAGNOSIS — J309 Allergic rhinitis, unspecified: Secondary | ICD-10-CM

## 2015-08-22 DIAGNOSIS — J45998 Other asthma: Secondary | ICD-10-CM

## 2015-08-22 DIAGNOSIS — Z7189 Other specified counseling: Secondary | ICD-10-CM

## 2015-08-22 DIAGNOSIS — R000 Tachycardia, unspecified: Secondary | ICD-10-CM | POA: Diagnosis not present

## 2015-08-22 DIAGNOSIS — Z6841 Body Mass Index (BMI) 40.0 and over, adult: Secondary | ICD-10-CM

## 2015-08-22 DIAGNOSIS — J45909 Unspecified asthma, uncomplicated: Secondary | ICD-10-CM | POA: Insufficient documentation

## 2015-08-22 DIAGNOSIS — R55 Syncope and collapse: Secondary | ICD-10-CM | POA: Diagnosis not present

## 2015-08-22 NOTE — Patient Instructions (Signed)
Get records   from dr Einar Gip.cardiologist .    For our records .  And gyne callahan.  Try to get better blocks of sleep .  Decrease or elimated sugar drinks     caffiene  in  Moderation Get appt for fasting labs  I will put in orders .  Plan fu depending on results and how your are doing.       Food Choices for Gastroesophageal Reflux Disease, Adult When you have gastroesophageal reflux disease (GERD), the foods you eat and your eating habits are very important. Choosing the right foods can help ease the discomfort of GERD. WHAT GENERAL GUIDELINES DO I NEED TO FOLLOW?  Choose fruits, vegetables, whole grains, low-fat dairy products, and low-fat meat, fish, and poultry.  Limit fats such as oils, salad dressings, butter, nuts, and avocado.  Keep a food diary to identify foods that cause symptoms.  Avoid foods that cause reflux. These may be different for different people.  Eat frequent small meals instead of three large meals each day.  Eat your meals slowly, in a relaxed setting.  Limit fried foods.  Cook foods using methods other than frying.  Avoid drinking alcohol.  Avoid drinking large amounts of liquids with your meals.  Avoid bending over or lying down until 2-3 hours after eating. WHAT FOODS ARE NOT RECOMMENDED? The following are some foods and drinks that may worsen your symptoms: Vegetables Tomatoes. Tomato juice. Tomato and spaghetti sauce. Chili peppers. Onion and garlic. Horseradish. Fruits Oranges, grapefruit, and lemon (fruit and juice). Meats High-fat meats, fish, and poultry. This includes hot dogs, ribs, ham, sausage, salami, and bacon. Dairy Whole milk and chocolate milk. Sour cream. Cream. Butter. Ice cream. Cream cheese.  Beverages Coffee and tea, with or without caffeine. Carbonated beverages or energy drinks. Condiments Hot sauce. Barbecue sauce.  Sweets/Desserts Chocolate and cocoa. Donuts. Peppermint and spearmint. Fats and Oils High-fat  foods, including [place] fries and potato chips. Other Vinegar. Strong spices, such as black pepper, white pepper, red pepper, cayenne, curry powder, cloves, ginger, and chili powder. The items listed above may not be a complete list of foods and beverages to avoid. Contact your dietitian for more information.   This information is not intended to replace advice given to you by your health care provider. Make sure you discuss any questions you have with your health care provider.   Document Released: 06/02/2005 Document Revised: 06/23/2014 Document Reviewed: 04/06/2013 Elsevier Interactive Patient Education [employer].

## 2015-08-29 MED FILL — VIT D2 1.25 MG (50,000 UNIT: 1.25 MG | 84 days supply | Qty: 24 | Fill #1

## 2015-08-29 MED FILL — FLOVENT 250 MCG DISKUS: 250 | 30 days supply | Qty: 60 | Fill #2

## 2015-08-30 DIAGNOSIS — Z01419 Encounter for gynecological examination (general) (routine) without abnormal findings: Secondary | ICD-10-CM | POA: Diagnosis not present

## 2015-08-30 DIAGNOSIS — Z124 Encounter for screening for malignant neoplasm of cervix: Secondary | ICD-10-CM | POA: Diagnosis not present

## 2015-08-30 DIAGNOSIS — Z6841 Body Mass Index (BMI) 40.0 and over, adult: Secondary | ICD-10-CM | POA: Diagnosis not present

## 2015-09-04 MED FILL — VENTOLIN HFA 90 MCG INHALER: 108 (90 BAS | 21 days supply | Qty: 18 | Fill #1

## 2015-09-04 MED FILL — NORETHINDRONE 0.35 MG TAB: 0.35 | 84 days supply | Qty: 84 | Fill #0

## 2015-09-05 ENCOUNTER — Other Ambulatory Visit (INDEPENDENT_AMBULATORY_CARE_PROVIDER_SITE_OTHER): Payer: 59

## 2015-09-05 DIAGNOSIS — Z6841 Body Mass Index (BMI) 40.0 and over, adult: Secondary | ICD-10-CM | POA: Diagnosis not present

## 2015-09-05 DIAGNOSIS — K219 Gastro-esophageal reflux disease without esophagitis: Secondary | ICD-10-CM

## 2015-09-05 DIAGNOSIS — J45998 Other asthma: Secondary | ICD-10-CM

## 2015-09-05 DIAGNOSIS — R55 Syncope and collapse: Secondary | ICD-10-CM

## 2015-09-05 DIAGNOSIS — J45909 Unspecified asthma, uncomplicated: Secondary | ICD-10-CM | POA: Diagnosis not present

## 2015-09-05 DIAGNOSIS — R000 Tachycardia, unspecified: Secondary | ICD-10-CM

## 2015-09-05 DIAGNOSIS — Z833 Family history of diabetes mellitus: Secondary | ICD-10-CM | POA: Diagnosis not present

## 2015-09-05 LAB — CBC WITH DIFFERENTIAL/PLATELET
BASOS ABS: 0 10*3/uL (ref 0.0–0.1)
Basophils Relative: 0.5 % (ref 0.0–3.0)
EOS PCT: 2.9 % (ref 0.0–5.0)
Eosinophils Absolute: 0.2 10*3/uL (ref 0.0–0.7)
HCT: 39.6 % (ref 36.0–46.0)
HEMOGLOBIN: 13.4 g/dL (ref 12.0–15.0)
LYMPHS ABS: 1.6 10*3/uL (ref 0.7–4.0)
Lymphocytes Relative: 20.6 % (ref 12.0–46.0)
MCHC: 34 g/dL (ref 30.0–36.0)
MCV: 93.4 fl (ref 78.0–100.0)
MONO ABS: 0.4 10*3/uL (ref 0.1–1.0)
MONOS PCT: 5.2 % (ref 3.0–12.0)
NEUTROS PCT: 70.8 % (ref 43.0–77.0)
Neutro Abs: 5.4 10*3/uL (ref 1.4–7.7)
Platelets: 187 10*3/uL (ref 150.0–400.0)
RBC: 4.24 Mil/uL (ref 3.87–5.11)
RDW: 13 % (ref 11.5–15.5)
WBC: 7.6 10*3/uL (ref 4.0–10.5)

## 2015-09-05 LAB — LIPID PANEL
CHOL/HDL RATIO: 4
Cholesterol: 183 mg/dL (ref 0–200)
HDL: 50.3 mg/dL (ref >39.00)
LDL Cholesterol: 117 mg/dL — ABNORMAL HIGH (ref 0–99)
NONHDL: 133.07
Triglycerides: 78 mg/dL (ref 0.0–149.0)
VLDL: 15.6 mg/dL (ref 0.0–40.0)

## 2015-09-05 LAB — HEPATIC FUNCTION PANEL
ALK PHOS: 78 U/L (ref 39–117)
ALT: 17 U/L (ref 0–35)
AST: 16 U/L (ref 0–37)
Albumin: 3.8 g/dL (ref 3.5–5.2)
BILIRUBIN DIRECT: 0.1 mg/dL (ref 0.0–0.3)
Total Bilirubin: 0.4 mg/dL (ref 0.2–1.2)
Total Protein: 6.4 g/dL (ref 6.0–8.3)

## 2015-09-05 LAB — BASIC METABOLIC PANEL
BUN: 11 mg/dL (ref 6–23)
CHLORIDE: 111 meq/L (ref 96–112)
CO2: 23 meq/L (ref 19–32)
CREATININE: 0.83 mg/dL (ref 0.40–1.20)
Calcium: 9 mg/dL (ref 8.4–10.5)
GFR: 90.69 mL/min (ref >60.00)
Glucose, Bld: 87 mg/dL (ref 70–99)
POTASSIUM: 4.2 meq/L (ref 3.5–5.1)
Sodium: 142 mEq/L (ref 135–145)

## 2015-09-05 LAB — HEMOGLOBIN A1C: HEMOGLOBIN A1C: 5.1 % (ref 4.6–6.5)

## 2015-09-05 LAB — TSH: TSH: 2.16 u[IU]/mL (ref 0.35–4.50)

## 2015-09-05 LAB — T4, FREE: Free T4: 0.93 ng/dL (ref 0.60–1.60)

## 2015-09-06 ENCOUNTER — Encounter: Payer: 59 | Admitting: Podiatry

## 2015-09-06 LAB — INSULIN, FASTING: Insulin fasting, serum: 10.9 u[IU]/mL (ref 2.0–19.6)

## 2015-09-14 NOTE — Progress Notes (Signed)
This encounter was created in error - please disregard.

## 2015-09-18 ENCOUNTER — Other Ambulatory Visit: Payer: Self-pay

## 2015-09-18 ENCOUNTER — Telehealth: Payer: Self-pay

## 2015-09-18 ENCOUNTER — Telehealth: Payer: Self-pay | Admitting: Internal Medicine

## 2015-09-18 MED ORDER — FLUTICASONE-SALMETEROL 250-50 MCG/DOSE IN AEPB: 1.0000 | INHALATION_SPRAY | Freq: Two times a day (BID) | RESPIRATORY_TRACT | Status: DC

## 2015-09-18 MED ORDER — CETIRIZINE HCL 10 MG PO TABS: 10.0000 mg | ORAL_TABLET | Freq: Every day | ORAL | Status: DC

## 2015-09-18 MED FILL — ADVAIR 250/50 DISKUS: 250-50 | 30 days supply | Qty: 60 | Fill #0

## 2015-09-18 NOTE — Telephone Encounter (Signed)
Dol Visit 04/2015 [place]. She was switched from Advair to [place] @ her last OV. She thinks the Advair works much better and would like to be switched back. Also she is wanting her zyrtec sent in as a prescription so her insurance can pay for it. She also needs a refill on nasal spray.   Please Advise  Thanks

## 2015-09-18 NOTE — Telephone Encounter (Signed)
Pt would like results of labs. Please call back. °

## 2015-09-18 NOTE — Telephone Encounter (Signed)
Sent in advair and zyrtec

## 2015-09-18 NOTE — Telephone Encounter (Signed)
Please review labs. Looks like she was seen on 08/22/2015. Labs were done on 09/05/2015.

## 2015-09-18 NOTE — Telephone Encounter (Signed)
Fasting insulin is normal as well as  Blood sugar and  Blood count and thyroid    Cholesterol  only moderately elevated .  If  Continuing  To have ligh headedness   Plan ov to discuss    Otherwise   ROV in 4 months to review.

## 2015-09-18 NOTE — Telephone Encounter (Signed)
Please advise 

## 2015-09-18 NOTE — Telephone Encounter (Signed)
Please have patient start Advair 250 one inhalation two times per day and give script for zyrtec.

## 2015-09-19 NOTE — Telephone Encounter (Signed)
Left a message for a return call.

## 2015-09-19 NOTE — Telephone Encounter (Signed)
See result note.  

## 2015-09-25 ENCOUNTER — Encounter: Payer: Self-pay | Admitting: Family Medicine

## 2015-10-03 ENCOUNTER — Ambulatory Visit: Payer: 59 | Admitting: Dietician

## 2015-10-09 ENCOUNTER — Other Ambulatory Visit: Payer: Self-pay | Admitting: *Deleted

## 2015-10-09 MED ORDER — ALBUTEROL SULFATE HFA 108 (90 BASE) MCG/ACT IN AERS: 2.0000 | INHALATION_SPRAY | Freq: Four times a day (QID) | RESPIRATORY_TRACT | Status: DC | PRN

## 2015-10-09 MED FILL — VENTOLIN HFA 90 MCG INHALER: 108 (90 BAS | 25 days supply | Qty: 18 | Fill #0

## 2015-10-14 ENCOUNTER — Telehealth: Payer: 59 | Admitting: Family

## 2015-10-14 DIAGNOSIS — A084 Viral intestinal infection, unspecified: Secondary | ICD-10-CM | POA: Diagnosis not present

## 2015-10-14 MED ORDER — ONDANSETRON HCL 4 MG PO TABS: 4.0000 mg | ORAL_TABLET | Freq: Three times a day (TID) | ORAL | Status: DC | PRN

## 2015-10-14 NOTE — Progress Notes (Signed)

## 2015-10-15 MED FILL — ONDANSETRON HCL 4 MG TABLET: 4 | 7 days supply | Qty: 20 | Fill #0

## 2015-10-16 MED FILL — ADVAIR 250/50 DISKUS: 250-50 | 30 days supply | Qty: 60 | Fill #1

## 2015-10-16 MED FILL — MONTELUKAST SOD 10 MG TAB: 10 | 90 days supply | Qty: 90 | Fill #3

## 2015-11-07 ENCOUNTER — Other Ambulatory Visit: Payer: Self-pay | Admitting: Family

## 2015-11-07 DIAGNOSIS — A084 Viral intestinal infection, unspecified: Secondary | ICD-10-CM

## 2015-11-08 NOTE — Progress Notes (Signed)
Chief Complaint  Patient presents with  . Migraine    would like to restart medication    HPI: [place] 25 y.o.  Comes for acute visit  Because of in creasing has and other issues Has hx migraine since young  Hx of  topamax  Helped at the time   imitrex causes se .    Was on long term and then prescriber he left and then ran out   Stopped taking   Med  Had poss se  Slowing thoughts  At times    Headaches went a ways for a while and back now 2 months   Vomiting and    Taking ibu and ibupro concern rebound.   Now auras   And in vision  And then  Takes tylenol  Many triggers  tryes not to take meds  ? Appetite suppressant medication   .  Cut caffiene.  ? If getting sx from this  Out of control.   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   zofran   Helped some nausea headaches .   Has allergies  Asks to rx zyrted  Has gerd since"birth"  Can we refill this med instead of otc   Asks about appetite Supressant   ROS: See pertinent positives and negatives per HPI.gaine 40 # in past year or so no cp sob   Past Medical History  Diagnosis Date  . Asthma     since childhood  on controller meds extrinsic dr Carmelina Peal  . GERD (gastroesophageal reflux disease)     on nexium  for  long term sx since childhood  . Migraine   . Tachycardia     episodes with near syncope eval dr Nadyne Coombes  on no meds dced LABA  . Murmur     pt reports MVP  . Syncope     under eval ? cause  . H/O miscarriage, not currently pregnant     [redacted] weeks  march 2016  . Allergic rhinitis     remote hx of allergy shots    Family History  Problem Relation Age of Onset  . Asthma Father   . Arthritis Mother   . Arthritis Father   . Arthritis Maternal Grandmother   . Colon cancer Paternal Grandfather   . Breast cancer Maternal Grandmother   . Breast cancer Paternal Grandmother   . Breast cancer  Paternal Aunt   . Breast cancer Paternal Aunt   . Hyperlipidemia Father   . Hyperlipidemia Mother   . Heart disease Mother   . Heart disease Father   . Hypertension Mother   . Hypertension Father   . Diabetes Mother   . Diabetes Father   . Diabetes Maternal Grandfather   . Diabetes Paternal Grandmother   . Diabetes Paternal Grandfather     Social History   Social History  . Marital Status: Legally Separated    Spouse Name: N/A  . Number of Children: N/A  . Years of Education: N/A   Social History Main Topics  . Smoking status: Never Smoker   . Smokeless tobacco: Never Used  . Alcohol Use: 0.0 oz/week    0 Standard drinks or equivalent per week  . Drug Use: No  . Sexual Activity: No   Other Topics Concern  .  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 albuterol (PROVENTIL) (2.5 MG/3ML) 0.083% nebulizer solution Take 2.5 mg by nebulization every 6 (six) hours as needed for wheezing or shortness of breath.    Marland Kitchen albuterol (VENTOLIN HFA) 108 (90 Base) MCG/ACT inhaler Inhale 2 puffs into the lungs every 6 (six) hours as needed for wheezing or shortness of breath. 18 g 1  . Biotin (BIOTIN MAXIMUM STRENGTH) 10 MG TABS Take 1 tablet by mouth 2 (two) times daily.    . Fluticasone-Salmeterol (ADVAIR DISKUS) 250-50 MCG/DOSE AEPB Inhale 1 puff into the lungs 2 (two) times daily. 1 each 5  . ibuprofen (ADVIL,MOTRIN) 200 MG tablet Take 600-800 mg by mouth every 6 (six) hours as needed. For pain    . mometasone (NASONEX) 50 MCG/ACT nasal spray Use one to two sprays in each nostril once daily. 17 g 11  . montelukast (SINGULAIR) 10 MG tablet Take 1 tablet (10 mg total) by mouth daily. 30 tablet 11  . Multiple Vitamin  (MULTIVITAMIN) tablet Take 1 tablet by mouth daily.    . Olopatadine HCl (PATADAY) 0.2 % SOLN Apply 1 drop to eye 2 (two) times daily.    . ondansetron (ZOFRAN) 4 MG tablet Take 1 tablet (4 mg total) by mouth every 8 (eight) hours as needed for nausea or vomiting. 20 tablet 0  . Vitamin D, Ergocalciferol, (DRISDOL) 50000 UNITS CAPS capsule 2 (two) times a week.  1  . cetirizine (ZYRTEC) 10 MG tablet Take 1 tablet (10 mg total) by mouth daily. 30 tablet 5  . esomeprazole (NEXIUM) 40 MG capsule Take 1 capsule (40 mg total) by mouth daily. 30 capsule 11  . Fluticasone Propionate, Inhal, (FLOVENT DISKUS) 250 MCG/BLIST AEPB Inhale one dose twice a day.  Rinse, gargle, and spit after use. (Patient not taking: Reported on 11/09/2015) 60 each 11   No facility-administered medications prior to visit.     EXAM:  BP 118/80 mmHg  Pulse 93  Temp(Src) 98.6 F (37 C) (Oral)  Ht 5' 6"  (1.676 m)  Wt 270 lb 3.2 oz (122.562 kg)  BMI 43.63 kg/m2  SpO2 98%  LMP 10/20/2015  Body mass index is 43.63 kg/(m^2).  GENERAL: vitals reviewed and listed above, alert, oriented, appears well hydrated and in no acute distress HEENT: atraumatic, conjunctiva  clear, no obvious abnormalities on inspection of external nose and ears OP : no lesion edema or exudate  NECK: no obvious masses on inspection palpation  LUNGS: clear to auscultation bilaterally, no wheezes, rales or rhonchi, good air movement CV: HRRR, no clubbing cyanosis or  peripheral edema nl cap refill  Short sem usb non radiation MS: moves all extremities without noticeable focal  abnormality PSYCH: pleasant and cooperative, no obvious depression or anxiety  ASSESSMENT AND PLAN:  Discussed the following assessment and plan:  Migraine with aura and without status migrainosus, not intractable  Gastroesophageal reflux disease, esophagitis presence not specified  BMI 40.0-44.9, adult ([facility])  Allergic rhinitis, unspecified allergic rhinitis  type reviewed above  -Patient advised to return or notify health care team  if symptoms worsen ,persist or new concerns arise. Total  visit 58mns > 50% spent counseling and coordinating care as indicated in above note and in instructions to patient .     Patient Instructions   Go back to  trackking headaches     To help  Mitigates triggers.  Sleep and risking dietary intake  Take 2-1/2 Aleve at the onset of these headaches. The anti-inflammatories work better than Tylenol to abort a migraine. She can also take her antinausea medicine although I don't know change the migraine. Restart Topamax and will follow for side effects. Dietary appetite suppressants only an adjunct to help. But we'll have to track her headaches and when you come back consider other interventions. May also want to consider Weight Watchers type of monitoring and we can always add an extra appetite suppressant if indicated. These medicines also have side effects. We'll refill the Zyrtec and Nexium at this time. Please make an appointment for 2 months from now to reassess.  25 mg in the evening for 1 week, 25 mg twice daily for 1 week, 25 mg in the morning and 50 mg in the evening for 1 week, and then 50 mg twice daily [6][7]      WMariann LasterK. Mylah Baynes M.D.

## 2015-11-09 ENCOUNTER — Ambulatory Visit (INDEPENDENT_AMBULATORY_CARE_PROVIDER_SITE_OTHER): Payer: 59 | Admitting: Internal Medicine

## 2015-11-09 ENCOUNTER — Encounter: Payer: Self-pay | Admitting: Internal Medicine

## 2015-11-09 VITALS — BP 118/80 | HR 93 | Temp 98.6°F | Ht 66.0 in | Wt 270.2 lb

## 2015-11-09 DIAGNOSIS — K219 Gastro-esophageal reflux disease without esophagitis: Secondary | ICD-10-CM

## 2015-11-09 DIAGNOSIS — G43109 Migraine with aura, not intractable, without status migrainosus: Secondary | ICD-10-CM | POA: Diagnosis not present

## 2015-11-09 DIAGNOSIS — Z6841 Body Mass Index (BMI) 40.0 and over, adult: Secondary | ICD-10-CM | POA: Diagnosis not present

## 2015-11-09 DIAGNOSIS — J309 Allergic rhinitis, unspecified: Secondary | ICD-10-CM | POA: Diagnosis not present

## 2015-11-09 MED ORDER — TOPIRAMATE 25 MG PO TABS: ORAL_TABLET | ORAL | Status: DC

## 2015-11-09 MED ORDER — CETIRIZINE HCL 10 MG PO TABS
10.0000 mg | ORAL_TABLET | Freq: Every day | ORAL | Status: DC
Start: 2015-11-09 — End: 2016-07-10

## 2015-11-09 MED ORDER — ESOMEPRAZOLE MAGNESIUM 40 MG PO CPDR: 40.0000 mg | DELAYED_RELEASE_CAPSULE | Freq: Every day | ORAL | Status: DC

## 2015-11-09 MED FILL — ESOMEPRAZOLE MAG DR 40 MG C: 40 | 30 days supply | Qty: 30 | Fill #0

## 2015-11-09 NOTE — Assessment & Plan Note (Signed)
Onset since the child had been in remission recent increase risk-benefit restart Topamax low-dose take the majority of dose at night follow-up in 2 months. Tracking sleep triggers etc. Take NSAID at onset of headache and not Tylenol. Erratic lifestyle external factors may be triggering.

## 2015-11-09 NOTE — Patient Instructions (Addendum)
  Go back to  trackking headaches     To help  Mitigates triggers.  Sleep and risking dietary intake  Take 2-1/2 Aleve at the onset of these headaches. The anti-inflammatories work better than Tylenol to abort a migraine. She can also take her antinausea medicine although I don't know change the migraine. Restart Topamax and will follow for side effects. Dietary appetite suppressants only an adjunct to help. But we'll have to track her headaches and when you come back consider other interventions. May also want to consider Weight Watchers type of monitoring and we can always add an extra appetite suppressant if indicated. These medicines also have side effects. We'll refill the Zyrtec and Nexium at this time. Please make an appointment for 2 months from now to reassess.  25 mg in the evening for 1 week, 25 mg twice daily for 1 week, 25 mg in the morning and 50 mg in the evening for 1 week, and then 50 mg twice daily [6][7]

## 2015-11-09 NOTE — Progress Notes (Signed)
Pre visit review using our clinic tool,if applicable. No additional management support is needed unless otherwise documented below in the visit note.  

## 2015-11-09 NOTE — Assessment & Plan Note (Signed)
Discussed it she will patient begin lifestyle intervention and tracking. Strategies are just in 100 due for failure without consider Weight Watchers regular sleep monitoring will discuss when she comes back to months. Try to get headaches under control meantime.

## 2015-11-14 MED FILL — ALL DAY ALLERGY 10 MG TAB: 10 | 100 days supply | Qty: 100 | Fill #0

## 2015-11-14 MED FILL — VENTOLIN HFA 90 MCG INHALER: 108 (90 BAS | 25 days supply | Qty: 18 | Fill #1

## 2015-11-14 MED FILL — ADVAIR 250/50 DISKUS: 250-50 | 30 days supply | Qty: 60 | Fill #2

## 2015-11-15 MED FILL — TOPIRAMATE 25 MG TABLET: 25 | 30 days supply | Qty: 60 | Fill #0

## 2015-12-04 ENCOUNTER — Encounter: Payer: Self-pay | Admitting: Internal Medicine

## 2015-12-05 NOTE — Telephone Encounter (Signed)
Please send in rx for 50 mg topamax bid disp 60 refill x 1  And let pat know

## 2015-12-07 MED ORDER — TOPIRAMATE 50 MG PO TABS: 50.0000 mg | ORAL_TABLET | Freq: Two times a day (BID) | ORAL | Status: DC

## 2015-12-07 MED FILL — TOPIRAMATE 50 MG TABLET: 50 | 30 days supply | Qty: 60 | Fill #0

## 2015-12-10 MED FILL — ADVAIR 250/50 DISKUS: 250-50 | 30 days supply | Qty: 60 | Fill #3

## 2015-12-10 MED FILL — ESOMEPRAZOLE MAG DR 40 MG C: 40 | 30 days supply | Qty: 30 | Fill #1

## 2015-12-11 ENCOUNTER — Other Ambulatory Visit: Payer: Self-pay

## 2015-12-11 NOTE — Telephone Encounter (Signed)
Pt has been using too much albuterol. Had a refill August 21, 2015 and October 09, 2015 of albuterol. Needs an appoinment

## 2015-12-31 ENCOUNTER — Other Ambulatory Visit: Payer: Self-pay | Admitting: Internal Medicine

## 2015-12-31 MED FILL — ADVAIR 250/50 DISKUS: 250-50 | 30 days supply | Qty: 60 | Fill #4

## 2015-12-31 MED FILL — MONTELUKAST SOD 10 MG TAB: 10 | 90 days supply | Qty: 90 | Fill #4

## 2016-01-01 MED FILL — TOPIRAMATE 50 MG TABLET: 50 | 30 days supply | Qty: 60 | Fill #0

## 2016-01-02 ENCOUNTER — Ambulatory Visit: Payer: 59 | Admitting: Allergy and Immunology

## 2016-01-03 MED FILL — ESOMEPRAZOLE MAG DR 40 MG C: 40 | 30 days supply | Qty: 30 | Fill #2

## 2016-01-09 ENCOUNTER — Telehealth: Payer: 59 | Admitting: Family

## 2016-01-09 DIAGNOSIS — B3731 Acute candidiasis of vulva and vagina: Secondary | ICD-10-CM

## 2016-01-09 DIAGNOSIS — B373 Candidiasis of vulva and vagina: Secondary | ICD-10-CM | POA: Diagnosis not present

## 2016-01-09 MED ORDER — FLUCONAZOLE 150 MG PO TABS: 150.0000 mg | ORAL_TABLET | Freq: Once | ORAL | 0 refills | Status: AC

## 2016-01-09 NOTE — Progress Notes (Signed)

## 2016-01-11 MED FILL — ALL DAY ALLERGY 10 MG TAB: 10 | 100 days supply | Qty: 100 | Fill #0

## 2016-01-15 ENCOUNTER — Ambulatory Visit (INDEPENDENT_AMBULATORY_CARE_PROVIDER_SITE_OTHER): Payer: 59 | Admitting: Allergy and Immunology

## 2016-01-15 ENCOUNTER — Encounter: Payer: Self-pay | Admitting: Allergy and Immunology

## 2016-01-15 VITALS — BP 118/80 | HR 76 | Resp 20

## 2016-01-15 DIAGNOSIS — H1010 Acute atopic conjunctivitis, unspecified eye: Secondary | ICD-10-CM | POA: Diagnosis not present

## 2016-01-15 DIAGNOSIS — J309 Allergic rhinitis, unspecified: Secondary | ICD-10-CM | POA: Diagnosis not present

## 2016-01-15 DIAGNOSIS — J4540 Moderate persistent asthma, uncomplicated: Secondary | ICD-10-CM | POA: Diagnosis not present

## 2016-01-15 DIAGNOSIS — K219 Gastro-esophageal reflux disease without esophagitis: Secondary | ICD-10-CM

## 2016-01-15 MED ORDER — FLUTICASONE-SALMETEROL 500-50 MCG/DOSE IN AEPB: INHALATION_SPRAY | RESPIRATORY_TRACT | 1 refills | Status: DC

## 2016-01-15 NOTE — Progress Notes (Signed)
Follow-up Note  Referring Provider: Burnis Medin, MD Primary Provider: Lottie Dawson, MD Date of Office Visit: 01/15/2016  Subjective:   Linda Becker (DOB: [birth year]) is a 25 y.o. female who returns to the [facility] on 01/15/2016 in re-evaluation of the following:  HPI: Linda Becker presents to this clinic in reevaluation of her asthma and allergic rhinoconjunctivitis and reflux. I've not seen her in his clinic since November 2016.  Her asthma has been really doing quite well. She did have an episode of influenza in February that did flare her asthma requiring her to get a systemic steroid but otherwise was doing very well up until about 8 weeks ago. At that point in time she did develop a little bit more requirement for short acting bronchodilator about 3 times per week for intermittent wheezing and coughing and some shortness of breath. In addition, she has developed associated nasal congestion and sneezing and runny nose and itchy red watery eyes along with her lower respiratory tract symptoms during the same time frame. She has been having exposure to dog about 3-4 times per week for the past month or so.  Her reflux is under excellent control and she uses Nexium twice a day. She does not consume any caffeine and rarely has any chocolate.    Medication List      albuterol (2.5 MG/3ML) 0.083% nebulizer solution Commonly known as:  PROVENTIL Take 2.5 mg by nebulization every 6 (six) hours as needed for wheezing or shortness of breath.   albuterol 108 (90 Base) MCG/ACT inhaler Commonly known as:  VENTOLIN HFA Inhale 2 puffs into the lungs every 6 (six) hours as needed for wheezing or shortness of breath.   BIOTIN MAXIMUM STRENGTH 10 MG Tabs Generic drug:  Biotin Take 1 tablet by mouth 2 (two) times daily.   cetirizine 10 MG tablet Commonly known as:  ZYRTEC Take 1 tablet (10 mg total) by mouth daily.   esomeprazole 40 MG capsule Commonly  known as:  NEXIUM Take 1 capsule (40 mg total) by mouth daily.   Fluticasone-Salmeterol 250-50 MCG/DOSE Aepb Commonly known as:  ADVAIR DISKUS Inhale 1 puff into the lungs 2 (two) times daily.   ibuprofen 200 MG tablet Commonly known as:  ADVIL,MOTRIN Take 600-800 mg by mouth every 6 (six) hours as needed. For pain   mometasone 50 MCG/ACT nasal spray Commonly known as:  NASONEX Use one to two sprays in each nostril once daily.   montelukast 10 MG tablet Commonly known as:  SINGULAIR Take 1 tablet (10 mg total) by mouth daily.   multivitamin tablet Take 1 tablet by mouth daily.   ondansetron 4 MG tablet Commonly known as:  ZOFRAN Take 1 tablet (4 mg total) by mouth every 8 (eight) hours as needed for nausea or vomiting.   PATADAY 0.2 % Soln Generic drug:  Olopatadine HCl Apply 1 drop to eye 2 (two) times daily.   topiramate 50 MG tablet Commonly known as:  TOPAMAX TAKE 1 TABLET BY MOUTH 2 TIMES DAILY.   Vitamin D (Ergocalciferol) 50000 units Caps capsule Commonly known as:  DRISDOL 2 (two) times a week.       Past Medical History:  Diagnosis Date  . Allergic rhinitis    remote hx of allergy shots  . Asthma    since childhood  on controller meds extrinsic dr Carmelina Peal  . GERD (gastroesophageal reflux disease)    on nexium  for  long term sx since childhood  .  H/O miscarriage, not currently pregnant    [redacted] weeks  march 2016  . Migraine   . Murmur    pt reports MVP  . Syncope    under eval ? cause  . Tachycardia    episodes with near syncope eval dr Nadyne Coombes  on no meds dced LABA    Past Surgical History:  Procedure Laterality Date  . broken right femur  2008   rod placed  . TONSILLECTOMY  2006  . WISDOM TOOTH EXTRACTION      No Known Allergies  Review of systems negative except as noted in HPI / PMHx or noted below:  Review of Systems  Constitutional: Negative.   HENT: Negative.   Eyes: Negative.   Respiratory: Negative.   Cardiovascular: Negative.     Gastrointestinal: Negative.   Genitourinary: Negative.   Musculoskeletal: Negative.   Skin: Negative.   Neurological: Negative.   Endo/Heme/Allergies: Negative.   Psychiatric/Behavioral: Negative.      Objective:   Vitals:   01/15/16 0814  BP: 118/80  Pulse: 76  Resp: 20          Physical Exam  Constitutional: She is well-developed, well-nourished, and in no distress.  HENT:  Head: Normocephalic.  Right Ear: Tympanic membrane, external ear and ear canal normal.  Left Ear: Tympanic membrane, external ear and ear canal normal.  Nose: Nose normal. No mucosal edema or rhinorrhea.  Mouth/Throat: Uvula is midline, oropharynx is clear and moist and mucous membranes are normal. No oropharyngeal exudate.  Eyes: Conjunctivae are normal.  Neck: Trachea normal. No tracheal tenderness present. No tracheal deviation present. No thyromegaly present.  Cardiovascular: Normal rate, regular rhythm, S1 normal, S2 normal and normal heart sounds.   No murmur heard. Pulmonary/Chest: Breath sounds normal. No stridor. No respiratory distress. She has no wheezes. She has no rales.  Musculoskeletal: She exhibits no edema.  Lymphadenopathy:       Head (right side): No tonsillar adenopathy present.       Head (left side): No tonsillar adenopathy present.    She has no cervical adenopathy.  Neurological: She is alert. Gait normal.  Skin: No rash noted. She is not diaphoretic. No erythema. Nails show no clubbing.  Psychiatric: Mood and affect normal.    Diagnostics:    Spirometry was performed and demonstrated an FEV1 of 3.84 at 115 % of predicted.  The patient had an Asthma Control Test with the following results: ACT Total Score: 22.    Assessment and Plan:   1. Asthma, moderate persistent, well-controlled   2. Allergic rhinoconjunctivitis   3. Gastroesophageal reflux disease, esophagitis presence not specified     1. Change Advair to Advair 500 one inhalation two times per day for next  month and then can attempt to decrease back to 250  2. Continue montelukast 37m one tablet one time per day  3. Continue Nexium 40 mg one tablet twice a day  4. Continue Nasonex 1-2 sprays each nostril 3-7 times per day  5. Continue ProAir HFA 2 inhalations every 4-6 hours of needed.  6. Continue antihistamine if needed.  7. Return in one year or earlier if problem.   8. Obtain fall Flu vaccine  9. Consider immunotherapy  [place]aniadoes appear to have a little bit more activity of her atopic respiratory disease over the course of the past few months and this may be secondary to the indoor dog exposure that she's been having during this timeframe. I will give her a little bit  higher dose of inhaled steroid and I've encouraged her to use her Nasonex a little bit more frequently over the course of the next month and we'll see what happens at that point in time. If she does well she can consolidate her treatment and continue on her Advair 250/50 and her intermittent Nasonex and montelukast which is been working wonderful for her prior to this most recent flareup. She was also interested in discussing immunotherapy today and I did give her some literature on this form of treatment and she is presently considering this option. I'll see her back in this clinic in 1 year or earlier if there is a problem.  Allena Katz, MD [place]

## 2016-01-15 NOTE — Patient Instructions (Addendum)
  1. Change Advair to Advair 500 one inhalation two times per day for next month and then can attempt to decrease back to 250  2. Continue montelukast 27m one tablet one time per day  3. Continue Nexium 40 mg one tablet twice a day  4. Continue Nasonex 1-2 sprays each nostril 3-7 times per day  5. Continue ProAir HFA 2 inhalations every 4-6 hours of needed.  6. Continue antihistamine if needed.  7. Return in one year or earlier if problem.   8. Obtain fall Flu vaccine  9. Consider immunotherapy

## 2016-01-20 NOTE — Progress Notes (Signed)
Pre visit review using our clinic review tool, if applicable. No additional management support is needed unless otherwise documented below in the visit note.  Chief Complaint  Patient presents with  . Follow-up    Topamax is helping.  Still having 1 headache a week.    HPI: Linda Becker 25 y.o.   Fu topamax   Significantly decrease      Some break through  One or 2 per week  Some nausea requests something for nausea  Taking  2 50 mg at night  Not a lot of time for exercise and   Hard to lose weight  Asks advice   She has stopped sodas and working on dec sweet tea. ROS: See pertinent positives and negatives per HPI.  Past Medical History:  Diagnosis Date  . Allergic rhinitis    remote hx of allergy shots  . Asthma    since childhood  on controller meds extrinsic dr Carmelina Peal  . GERD (gastroesophageal reflux disease)    on nexium  for  long term sx since childhood  . H/O miscarriage, not currently pregnant    [redacted] weeks  march 2016  . Migraine   . Murmur    pt reports MVP  . Syncope    under eval ? cause  . Tachycardia    episodes with near syncope eval dr Nadyne Coombes  on no meds dced LABA    Family History  Problem Relation Age of Onset  . Asthma Father   . Arthritis Father   . Hyperlipidemia Father   . Heart disease Father   . Hypertension Father   . Diabetes Father   . Arthritis Mother   . Hyperlipidemia Mother   . Heart disease Mother   . Hypertension Mother   . Diabetes Mother   . Arthritis Maternal Grandmother   . Breast cancer Maternal Grandmother   . Colon cancer Paternal Grandfather   . Diabetes Paternal Grandfather   . Breast cancer Paternal Grandmother   . Diabetes Paternal Grandmother   . Breast cancer Paternal Aunt   . Breast cancer Paternal Aunt   . Diabetes Maternal Grandfather     Social History   Social History  . Marital status: Legally Separated    Spouse name: N/A  . Number of children: N/A  . Years of education: N/A   Social  History Main Topics  . Smoking status: Never Smoker  . Smokeless tobacco: Never Used  . Alcohol use 0.0 oz/week  . Drug use: No  . Sexual activity: No   Other Topics Concern  . 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 albuterol (PROVENTIL) (2.5 MG/3ML) 0.083% nebulizer solution Take 2.5 mg by nebulization every 6 (six) hours as needed for wheezing or shortness of breath.    Marland Kitchen albuterol (VENTOLIN HFA) 108 (90 Base) MCG/ACT inhaler Inhale 2 puffs into the lungs every 6 (six) hours as needed for wheezing or shortness of breath. 18 g 1  . Biotin (BIOTIN MAXIMUM STRENGTH) 10 MG TABS Take 1 tablet by mouth 2 (two) times daily.    . cetirizine (ZYRTEC) 10 MG tablet Take  1 tablet (10 mg total) by mouth daily. 30 tablet 5  . esomeprazole (NEXIUM) 40 MG capsule Take 1 capsule (40 mg total) by mouth daily. 30 capsule 3  . Fluticasone-Salmeterol (ADVAIR DISKUS) 500-50 MCG/DOSE AEPB INHALE ONE PUFF TWICE DAILY TO PREVENT COUGH OR WHEEZE. RINSE MOUTH AFTER USE. 1 each 1  . ibuprofen (ADVIL,MOTRIN) 200 MG tablet Take 600-800 mg by mouth every 6 (six) hours as needed. For pain    . mometasone (NASONEX) 50 MCG/ACT nasal spray Use one to two sprays in each nostril once daily. 17 g 11  . montelukast (SINGULAIR) 10 MG tablet Take 1 tablet (10 mg total) by mouth daily. 30 tablet 11  . Multiple Vitamin (MULTIVITAMIN) tablet Take 1 tablet by mouth daily.    . Olopatadine HCl (PATADAY) 0.2 % SOLN Apply 1 drop to eye 2 (two) times daily.    . ondansetron (ZOFRAN) 4 MG tablet Take 1 tablet (4 mg total) by mouth every 8 (eight) hours as needed for nausea or vomiting. 20 tablet 0  . Vitamin D, Ergocalciferol, (DRISDOL) 50000 UNITS CAPS capsule 2  (two) times a week.  1  . topiramate (TOPAMAX) 50 MG tablet TAKE 1 TABLET BY MOUTH 2 TIMES DAILY. 60 tablet 5  . Fluticasone-Salmeterol (ADVAIR DISKUS) 250-50 MCG/DOSE AEPB Inhale 1 puff into the lungs 2 (two) times daily. 1 each 5   No facility-administered medications prior to visit.      EXAM:  BP 104/66 (BP Location: Right Arm, Patient Position: Sitting, Cuff Size: Large)   Temp 97.1 F (36.2 C) (Temporal)   Wt 262 lb 12.8 oz (119.2 kg)   BMI 42.42 kg/m   Body mass index is 42.42 kg/m.  GENERAL: vitals reviewed and listed above, alert, oriented, appears well hydrated and in no acute distress HEENT: atraumatic, conjunctiva  clear, no obvious abnormalities on inspection of external nose and ears  NECK: no obvious masses on inspection palpation  LUNGS: clear to auscultation bilaterally, no wheezes, rales or rhonchi, good air movement CV: HRRR, no clubbing cyanosis or  peripheral edema nl cap refill  MS: moves all extremities without noticeable focal  abnormality PSYCH: pleasant and cooperative, no obvious depression or anxiety Lab Results  Component Value Date   WBC 7.6 09/05/2015   HGB 13.4 09/05/2015   HCT 39.6 09/05/2015   PLT 187.0 09/05/2015   GLUCOSE 87 09/05/2015   CHOL 183 09/05/2015   TRIG 78.0 09/05/2015   HDL 50.30 09/05/2015   LDLCALC 117 (H) 09/05/2015   ALT 17 09/05/2015   AST 16 09/05/2015   NA 142 09/05/2015   K 4.2 09/05/2015   CL 111 09/05/2015   CREATININE 0.83 09/05/2015   BUN 11 09/05/2015   CO2 23 09/05/2015   TSH 2.16 09/05/2015   HGBA1C 5.1 09/05/2015   Wt Readings from Last 3 Encounters:  01/22/16 262 lb 12.8 oz (119.2 kg)  11/09/15 270 lb 3.2 oz (122.6 kg)  08/22/15 256 lb (116.1 kg)     ASSESSMENT AND PLAN:  Discussed the following assessment and plan:  Migraine with aura and without status migrainosus, not intractable - inc to 3 50 mg per day topamax phenrgan per pat request with caution andf u 6 months   BMI 40.0-44.9, adult  (HCC) - disc strategies  weight watchers nutrition will call if needs referal disc bariatric surgery risk beneit  Total visit 42mns > 50% spent counseling and coordinating care as indicated in above note and in instructions to patient .  Regarding  obesity and interventions   -Patient advised to return or notify health care team  if symptoms worsen ,persist or new concerns arise.  Patient Instructions  Ok to increase the topamax to  3 -50 mg per day .      Consider trying weight watchers .  Let us know if we need to do a rx for flex spending   Also nutrition  Help   phenergan if needed with caution for nausea with headaches      Mariann Laster K. Danaysia Rader M.D.

## 2016-01-22 ENCOUNTER — Ambulatory Visit (INDEPENDENT_AMBULATORY_CARE_PROVIDER_SITE_OTHER): Payer: 59 | Admitting: Internal Medicine

## 2016-01-22 ENCOUNTER — Encounter: Payer: Self-pay | Admitting: Internal Medicine

## 2016-01-22 VITALS — BP 104/66 | Temp 97.1°F | Wt 262.8 lb

## 2016-01-22 DIAGNOSIS — Z6841 Body Mass Index (BMI) 40.0 and over, adult: Secondary | ICD-10-CM

## 2016-01-22 DIAGNOSIS — G43109 Migraine with aura, not intractable, without status migrainosus: Secondary | ICD-10-CM | POA: Diagnosis not present

## 2016-01-22 MED ORDER — PROMETHAZINE HCL 25 MG PO TABS: 25.0000 mg | ORAL_TABLET | Freq: Four times a day (QID) | ORAL | 0 refills | Status: DC | PRN

## 2016-01-22 MED ORDER — TOPIRAMATE 50 MG PO TABS: ORAL_TABLET | ORAL | 5 refills | Status: DC

## 2016-01-22 MED FILL — PROMETHAZINE 25 MG TABLET: 25 | 7 days supply | Qty: 30 | Fill #0

## 2016-01-22 MED FILL — TOPIRAMATE 50 MG TABLET: 50 | 30 days supply | Qty: 90 | Fill #0

## 2016-01-22 MED FILL — ADVAIR 500/50 DISKUS: 500-50 | 30 days supply | Qty: 60 | Fill #0

## 2016-01-22 NOTE — Patient Instructions (Addendum)
Ok to increase the topamax to  3 -50 mg per day .      Consider trying weight watchers .  Let us know if we need to do a rx for flex spending   Also nutrition  Help   phenergan if needed with caution for nausea with headaches

## 2016-02-14 MED FILL — ESOMEPRAZOLE MAG DR 40 MG C: 40 | 30 days supply | Qty: 30 | Fill #3

## 2016-02-25 MED FILL — ADVAIR 500/50 DISKUS: 500-50 | 30 days supply | Qty: 60 | Fill #1

## 2016-02-25 MED FILL — TOPIRAMATE 50 MG TABLET: 50 | 30 days supply | Qty: 90 | Fill #1

## 2016-02-26 ENCOUNTER — Other Ambulatory Visit: Payer: Self-pay | Admitting: *Deleted

## 2016-02-26 MED ORDER — ALBUTEROL SULFATE HFA 108 (90 BASE) MCG/ACT IN AERS: 2.0000 | INHALATION_SPRAY | Freq: Four times a day (QID) | RESPIRATORY_TRACT | 1 refills | Status: DC | PRN

## 2016-02-26 MED FILL — VENTOLIN HFA 90 MCG INHALER: 108 (90 BAS | 25 days supply | Qty: 18 | Fill #0

## 2016-03-12 ENCOUNTER — Telehealth: Payer: 59 | Admitting: Nurse Practitioner

## 2016-03-12 DIAGNOSIS — B3749 Other urogenital candidiasis: Secondary | ICD-10-CM

## 2016-03-12 MED ORDER — FLUCONAZOLE 150 MG PO TABS: 150.0000 mg | ORAL_TABLET | Freq: Once | ORAL | 0 refills | Status: AC

## 2016-03-12 MED ORDER — NITROFURANTOIN MONOHYD MACRO 100 MG PO CAPS: 100.0000 mg | ORAL_CAPSULE | Freq: Two times a day (BID) | ORAL | 0 refills | Status: DC

## 2016-03-12 NOTE — Progress Notes (Signed)
We are sorry that you are not feeling well.  Here is how we plan to help!  Based on what you shared with me it looks like you most likely have a simple urinary tract infection.  A UTI (Urinary Tract Infection) is a bacterial infection of the bladder.  Most cases of urinary tract infections are simple to treat but a key part of your care is to encourage you to drink plenty of fluids and watch your symptoms carefully.  I have prescribed MacroBid 100 mg twice a day for 5 days.  Your symptoms should gradually improve. Call us if the burning in your urine worsens, you develop worsening fever, back pain or pelvic pain or if your symptoms do not resolve after completing the antibiotic.  Urinary tract infections can be prevented by drinking plenty of water to keep your body hydrated.  Also be sure when you wipe, wipe from front to back and don't hold it in!  If possible, empty your bladder every 4 hours.  Your e-visit answers were reviewed by a board certified advanced clinical practitioner to complete your personal care plan.  Depending on the condition, your plan could have included both over the counter or prescription medications.  If there is a problem please reply  once you have received a response from your provider.  Your safety is important to [place].  If you have drug allergies check your prescription carefully.    You can use MyChart to ask questions about today's visit, request a non-urgent call back, or ask for a work or school excuse for 24 hours related to this e-Visit. If it has been greater than 24 hours you will need to follow up with your provider, or enter a new e-Visit to address those concerns.   You will get an e-mail in the next two days asking about your experience.  I hope that your e-visit has been valuable and will speed your recovery. Thank you for using e-visits. We are sorry that you are not feeling well. Here is how we plan to help! Based on what you shared with me it looks  like you: May have a yeast vaginosis   Vaginosis is an inflammation of the vagina that can result in discharge, itching and pain. The cause is usually a change in the normal balance of vaginal bacteria or an infection. Vaginosis can also result from reduced estrogen levels after menopause.  The most common causes of vaginosis are:   Bacterial vaginosis which results from an overgrowth of one on several organisms that are normally present in your vagina.   Yeast infections which are caused by a naturally occurring fungus called candida.   Vaginal atrophy (atrophic vaginosis) which results from the thinning of the vagina from reduced estrogen levels after menopause.   Trichomoniasis which is caused by a parasite and is commonly transmitted by sexual intercourse.  Factors that increase your risk of developing vaginosis include: Medications, such as antibiotics and steroids Uncontrolled diabetes Use of hygiene products such as bubble bath, vaginal spray or vaginal deodorant Douching Wearing damp or tight-fitting clothing Using an intrauterine device (IUD) for birth control Hormonal changes, such as those associated with pregnancy, birth control pills or menopause Sexual activity Having a sexually transmitted infection  Your treatment plan is A single Diflucan (fluconazole) 163m tablet once.  I have electronically sent this prescription into the pharmacy that you have chosen.  Be sure to take all of the medication as directed. Stop taking any medication if  you develop a rash, tongue swelling or shortness of breath. Mothers who are breast feeding should consider pumping and discarding their breast milk while on these antibiotics. However, there is no consensus that infant exposure at these doses would be harmful.  Remember that medication creams can weaken latex condoms. Marland Kitchen   HOME CARE:  Good hygiene may prevent some types of vaginosis from recurring and may relieve some  symptoms:  Avoid baths, hot tubs and whirlpool spas. Rinse soap from your outer genital area after a shower, and dry the area well to prevent irritation. Don't use scented or harsh soaps, such as those with deodorant or antibacterial action. Avoid irritants. These include scented tampons and pads. Wipe from front to back after using the toilet. Doing so avoids spreading fecal bacteria to your vagina.  Other things that may help prevent vaginosis include:  Don't douche. Your vagina doesn't require cleansing other than normal bathing. Repetitive douching disrupts the normal organisms that reside in the vagina and can actually increase your risk of vaginal infection. Douching won't clear up a vaginal infection. Use a latex condom. Both female and female latex condoms may help you avoid infections spread by sexual contact. Wear cotton underwear. Also wear pantyhose with a cotton crotch. If you feel comfortable without it, skip wearing underwear to bed. Yeast thrives in [employer] Your symptoms should improve in the next day or two.  GET HELP RIGHT AWAY IF:  You have pain in your lower abdomen ( pelvic area or over your ovaries) You develop nausea or vomiting You develop a fever Your discharge changes or worsens You have persistent pain with intercourse You develop shortness of breath, a rapid pulse, or you faint.  These symptoms could be signs of problems or infections that need to be evaluated by a medical provider now.  MAKE SURE YOU   Understand these instructions. Will watch your condition. Will get help right away if you are not doing well or get worse.  Your e-visit answers were reviewed by a board certified advanced clinical practitioner to complete your personal care plan. Depending upon the condition, your plan could have included both over the counter or prescription medications. Please review your pharmacy choice to make sure that you have choses a pharmacy that is open  for you to pick up any needed prescription, Your safety is important to [place]. If you have drug allergies check your prescription carefully.   You can use MyChart to ask questions about today's visit, request a non-urgent call back, or ask for a work or school excuse for 24 hours related to this e-Visit. If it has been greater than 24 hours you will need to follow up with your provider, or enter a new e-Visit to address those concerns. You will get a MyChart message within the next two days asking about your experience. I hope that your e-visit has been valuable and will speed your recovery.

## 2016-03-19 MED FILL — FLUCONAZOLE 150 MG TABLET: 150 | 1 days supply | Qty: 1 | Fill #0

## 2016-03-19 MED FILL — AMOX-CLAV 875-125 MG TABLET: 875-125 | 7 days supply | Qty: 14 | Fill #0

## 2016-03-19 MED FILL — ESOMEPRAZOLE MAG DR 40 MG C: 40 | 30 days supply | Qty: 30 | Fill #0

## 2016-03-19 MED FILL — VENTOLIN HFA 90 MCG INHALER: 108 (90 BAS | 25 days supply | Qty: 18 | Fill #1

## 2016-03-19 MED FILL — SF 1.1% GEL: 1.1 | 30 days supply | Qty: 112 | Fill #0

## 2016-03-27 ENCOUNTER — Other Ambulatory Visit: Payer: Self-pay

## 2016-03-27 MED ORDER — FLUTICASONE-SALMETEROL 500-50 MCG/DOSE IN AEPB: INHALATION_SPRAY | RESPIRATORY_TRACT | 5 refills | Status: DC

## 2016-03-27 MED FILL — MONTELUKAST SOD 10 MG TAB: 10 | 30 days supply | Qty: 30 | Fill #5

## 2016-03-27 MED FILL — TOPIRAMATE 50 MG TABLET: 50 | 30 days supply | Qty: 90 | Fill #2

## 2016-03-28 MED FILL — ADVAIR 500/50 DISKUS: 500-50 | 30 days supply | Qty: 60 | Fill #0

## 2016-04-11 ENCOUNTER — Other Ambulatory Visit: Payer: Self-pay | Admitting: Allergy and Immunology

## 2016-04-14 MED FILL — ALL DAY ALLERGY 10 MG TAB: 10 | 80 days supply | Qty: 80 | Fill #1

## 2016-04-14 MED FILL — ESOMEPRAZOLE MAG DR 40 MG C: 40 | 30 days supply | Qty: 30 | Fill #1

## 2016-04-14 MED FILL — VENTOLIN HFA 90 MCG INHALER: 108 (90 BAS | 25 days supply | Qty: 18 | Fill #0

## 2016-04-25 MED FILL — ADVAIR 500/50 DISKUS: 500-50 | 30 days supply | Qty: 60 | Fill #1

## 2016-04-25 MED FILL — TOPIRAMATE 50 MG TABLET: 50 | 30 days supply | Qty: 90 | Fill #3

## 2016-04-29 ENCOUNTER — Other Ambulatory Visit: Payer: Self-pay | Admitting: *Deleted

## 2016-04-29 ENCOUNTER — Other Ambulatory Visit: Payer: Self-pay

## 2016-04-29 DIAGNOSIS — F3181 Bipolar II disorder: Secondary | ICD-10-CM | POA: Diagnosis not present

## 2016-04-29 DIAGNOSIS — F429 Obsessive-compulsive disorder, unspecified: Secondary | ICD-10-CM | POA: Diagnosis not present

## 2016-04-29 MED ORDER — MONTELUKAST SODIUM 10 MG PO TABS: 10.0000 mg | ORAL_TABLET | Freq: Every day | ORAL | 1 refills | Status: DC

## 2016-04-29 MED FILL — MONTELUKAST SOD 10 MG TAB: 10 | 90 days supply | Qty: 90 | Fill #0

## 2016-05-07 DIAGNOSIS — F408 Other phobic anxiety disorders: Secondary | ICD-10-CM | POA: Diagnosis not present

## 2016-05-07 DIAGNOSIS — F3181 Bipolar II disorder: Secondary | ICD-10-CM | POA: Diagnosis not present

## 2016-05-14 DIAGNOSIS — F408 Other phobic anxiety disorders: Secondary | ICD-10-CM | POA: Diagnosis not present

## 2016-05-14 DIAGNOSIS — F3181 Bipolar II disorder: Secondary | ICD-10-CM | POA: Diagnosis not present

## 2016-05-15 ENCOUNTER — Other Ambulatory Visit: Payer: Self-pay | Admitting: Allergy and Immunology

## 2016-05-15 MED FILL — ESOMEPRAZOLE MAG DR 40 MG C: 40 | 30 days supply | Qty: 30 | Fill #0

## 2016-05-26 DIAGNOSIS — F3181 Bipolar II disorder: Secondary | ICD-10-CM | POA: Diagnosis not present

## 2016-05-26 DIAGNOSIS — F408 Other phobic anxiety disorders: Secondary | ICD-10-CM | POA: Diagnosis not present

## 2016-05-27 MED FILL — ADVAIR 500/50 DISKUS: 500-50 | 30 days supply | Qty: 60 | Fill #2

## 2016-05-27 MED FILL — HEATHER TABLET: 0.35 | 84 days supply | Qty: 84 | Fill #1

## 2016-06-12 MED FILL — lamoTRIgine 100 MG TABS: 100 | 30 days supply | Qty: 30 | Fill #0

## 2016-06-12 MED FILL — ESOMEPRAZOLE MAG DR 40 MG C: 40 | 30 days supply | Qty: 30 | Fill #1

## 2016-06-22 ENCOUNTER — Telehealth: Payer: 59 | Admitting: Family

## 2016-06-22 DIAGNOSIS — J069 Acute upper respiratory infection, unspecified: Secondary | ICD-10-CM

## 2016-06-22 MED ORDER — BENZONATATE 100 MG PO CAPS: 100.0000 mg | ORAL_CAPSULE | Freq: Two times a day (BID) | ORAL | 0 refills | Status: DC | PRN

## 2016-06-22 MED ORDER — AZITHROMYCIN 250 MG PO TABS: ORAL_TABLET | ORAL | 0 refills | Status: DC

## 2016-06-22 NOTE — Progress Notes (Signed)
We are sorry that you are not feeling well.  Here is how we plan to help!  Based on what you have shared with me it looks like you have upper respiratory tract inflammation that has resulted in a significant cough.  Inflammation and infection in the upper respiratory tract is commonly called bronchitis and has four common causes:  Allergies, Viral Infections, Acid Reflux and Bacterial Infections.  Allergies, viruses and acid reflux are treated by controlling symptoms or eliminating the cause. An example might be a cough caused by taking certain blood pressure medications. You stop the cough by changing the medication. Another example might be a cough caused by acid reflux. Controlling the reflux helps control the cough.  Based on your presentation I believe you most likely have A cough due to bacteria.  When patients have a fever and a productive cough with a change in color or increased sputum production, we are concerned about bacterial bronchitis.  If left untreated it can progress to pneumonia.  If your symptoms do not improve with your treatment plan it is important that you contact your provider.   I have prescribed Azithromyin 250 mg: two tables now and then one tablet daily for 4 additonal days    In addition you may use A non-prescription cough medication called Robitussin DAC. Take 2 teaspoons every 8 hours or Delsym: take 2 teaspoons every 12 hours., A non-prescription cough medication called Mucinex DM: take 2 tablets every 12 hours. and A prescription cough medication called Tessalon Perles 173m. You may take 1-2 capsules every 8 hours as needed for your cough.    USE OF BRONCHODILATOR ("RESCUE") INHALERS: There is a risk from using your bronchodilator too frequently.  The risk is that over-reliance on a medication which only relaxes the muscles surrounding the breathing tubes can reduce the effectiveness of medications prescribed to reduce swelling and congestion of the tubes themselves.   Although you feel brief relief from the bronchodilator inhaler, your asthma may actually be worsening with the tubes becoming more swollen and filled with mucus.  This can delay other crucial treatments, such as oral steroid medications. If you need to use a bronchodilator inhaler daily, several times per day, you should discuss this with your provider.  There are probably better treatments that could be used to keep your asthma under control.     HOME CARE . Only take medications as instructed by your medical team. . Complete the entire course of an antibiotic. . Drink plenty of fluids and get plenty of rest. . Avoid close contacts especially the very young and the elderly . Cover your mouth if you cough or cough into your sleeve. . Always remember to wash your hands . A steam or ultrasonic humidifier can help congestion.   GET HELP RIGHT AWAY IF: . You develop worsening fever. . You become short of breath . You cough up blood. . Your symptoms persist after you have completed your treatment plan MAKE SURE YOU   Understand these instructions.  Will watch your condition.  Will get help right away if you are not doing well or get worse.  Your e-visit answers were reviewed by a board certified advanced clinical practitioner to complete your personal care plan.  Depending on the condition, your plan could have included both over the counter or prescription medications. If there is a problem please reply  once you have received a response from your provider. Your safety is important to [place]  If you have  drug allergies check your prescription carefully.    You can use MyChart to ask questions about today's visit, request a non-urgent call back, or ask for a work or school excuse for 24 hours related to this e-Visit. If it has been greater than 24 hours you will need to follow up with your provider, or enter a new e-Visit to address those concerns. You will get an e-mail in the next two days  asking about your experience.  I hope that your e-visit has been valuable and will speed your recovery. Thank you for using e-visits.

## 2016-06-24 DIAGNOSIS — F3181 Bipolar II disorder: Secondary | ICD-10-CM | POA: Diagnosis not present

## 2016-06-24 DIAGNOSIS — F408 Other phobic anxiety disorders: Secondary | ICD-10-CM | POA: Diagnosis not present

## 2016-07-01 MED FILL — VENTOLIN HFA 90 MCG INHALER: 108 (90 BAS | 25 days supply | Qty: 18 | Fill #1

## 2016-07-01 MED FILL — ADVAIR 500/50 DISKUS: 500-50 | 30 days supply | Qty: 60 | Fill #3

## 2016-07-08 DIAGNOSIS — F3181 Bipolar II disorder: Secondary | ICD-10-CM | POA: Diagnosis not present

## 2016-07-08 DIAGNOSIS — F408 Other phobic anxiety disorders: Secondary | ICD-10-CM | POA: Diagnosis not present

## 2016-07-10 ENCOUNTER — Other Ambulatory Visit: Payer: Self-pay | Admitting: Internal Medicine

## 2016-07-10 MED FILL — ESOMEPRAZOLE MAG DR 40 MG C: 40 | 30 days supply | Qty: 30 | Fill #2

## 2016-07-10 MED FILL — TOPIRAMATE 50 MG TABLET: 50 | 30 days supply | Qty: 90 | Fill #4

## 2016-07-10 MED FILL — ALL DAY ALLERGY 10 MG TAB: 10 | 80 days supply | Qty: 30 | Fill #0

## 2016-07-17 ENCOUNTER — Other Ambulatory Visit: Payer: Self-pay | Admitting: Internal Medicine

## 2016-07-17 NOTE — Telephone Encounter (Signed)
Denied.  Message sent to the pharmacy to have pt contact the office.

## 2016-07-23 MED FILL — lamoTRIgine 100 MG TABS: 100 | 30 days supply | Qty: 30 | Fill #0

## 2016-07-23 MED FILL — MONTELUKAST SOD 10 MG TAB: 10 | 90 days supply | Qty: 90 | Fill #1

## 2016-08-07 ENCOUNTER — Other Ambulatory Visit: Payer: Self-pay | Admitting: Allergy and Immunology

## 2016-08-07 DIAGNOSIS — F3181 Bipolar II disorder: Secondary | ICD-10-CM | POA: Diagnosis not present

## 2016-08-07 MED FILL — VENTOLIN HFA 90 MCG INHALER: 108 (90 BAS | 25 days supply | Qty: 18 | Fill #0

## 2016-08-07 MED FILL — ADVAIR 500/50 DISKUS: 500-50 | 30 days supply | Qty: 60 | Fill #4

## 2016-08-07 MED FILL — ESOMEPRAZOLE MAG DR 40 MG C: 40 | 30 days supply | Qty: 30 | Fill #0

## 2016-08-12 DIAGNOSIS — F3181 Bipolar II disorder: Secondary | ICD-10-CM | POA: Diagnosis not present

## 2016-08-18 MED FILL — TOPIRAMATE 50 MG TABLET: 50 | 30 days supply | Qty: 90 | Fill #5

## 2016-08-18 MED FILL — ALL DAY ALLERGY 10 MG TAB: 10 | 30 days supply | Qty: 30 | Fill #1

## 2016-08-20 DIAGNOSIS — F3181 Bipolar II disorder: Secondary | ICD-10-CM | POA: Diagnosis not present

## 2016-08-26 DIAGNOSIS — F3181 Bipolar II disorder: Secondary | ICD-10-CM | POA: Diagnosis not present

## 2016-08-27 MED FILL — LATUDA 40 MG TABLET: 40 | 30 days supply | Qty: 30 | Fill #0

## 2016-09-04 DIAGNOSIS — Z01419 Encounter for gynecological examination (general) (routine) without abnormal findings: Secondary | ICD-10-CM | POA: Diagnosis not present

## 2016-09-04 DIAGNOSIS — Z3202 Encounter for pregnancy test, result negative: Secondary | ICD-10-CM | POA: Diagnosis not present

## 2016-09-04 DIAGNOSIS — Z113 Encounter for screening for infections with a predominantly sexual mode of transmission: Secondary | ICD-10-CM | POA: Diagnosis not present

## 2016-09-04 DIAGNOSIS — B373 Candidiasis of vulva and vagina: Secondary | ICD-10-CM | POA: Diagnosis not present

## 2016-09-04 DIAGNOSIS — Z6841 Body Mass Index (BMI) 40.0 and over, adult: Secondary | ICD-10-CM | POA: Diagnosis not present

## 2016-09-04 MED FILL — FLUCONAZOLE 150 MG TABLET: 150 | 2 days supply | Qty: 2 | Fill #0

## 2016-09-04 MED FILL — NORETHINDRONE 0.35 MG TAB: 0.35 | 84 days supply | Qty: 84 | Fill #0

## 2016-09-05 MED FILL — lamoTRIgine 100 MG TABS: 100 | 30 days supply | Qty: 30 | Fill #1

## 2016-09-05 MED FILL — ESOMEPRAZOLE MAG DR 40 MG C: 40 | 30 days supply | Qty: 30 | Fill #1

## 2016-09-08 MED FILL — ADVAIR 500/50 DISKUS: 500-50 | 30 days supply | Qty: 60 | Fill #5

## 2016-09-11 DIAGNOSIS — F3181 Bipolar II disorder: Secondary | ICD-10-CM | POA: Diagnosis not present

## 2016-09-25 DIAGNOSIS — F3181 Bipolar II disorder: Secondary | ICD-10-CM | POA: Diagnosis not present

## 2016-09-26 ENCOUNTER — Telehealth: Payer: Self-pay

## 2016-09-26 DIAGNOSIS — F3181 Bipolar II disorder: Secondary | ICD-10-CM | POA: Diagnosis not present

## 2016-09-26 DIAGNOSIS — F429 Obsessive-compulsive disorder, unspecified: Secondary | ICD-10-CM | POA: Diagnosis not present

## 2016-09-26 MED FILL — LITHIUM CARBONATE ER 300 MG: 300 | 30 days supply | Qty: 90 | Fill #0

## 2016-09-26 NOTE — Telephone Encounter (Signed)
Ok to refill for 1 month  OV before refills Due for ROV  Have her make appt

## 2016-09-26 NOTE — Telephone Encounter (Signed)
Called patient, unable to reach the patient.

## 2016-09-29 MED FILL — lamoTRIgine 100 MG TABS: 100 | 30 days supply | Qty: 30 | Fill #0

## 2016-09-30 ENCOUNTER — Telehealth: Payer: Self-pay | Admitting: Emergency Medicine

## 2016-09-30 MED ORDER — TOPIRAMATE 50 MG PO TABS: ORAL_TABLET | ORAL | 0 refills | Status: DC

## 2016-09-30 NOTE — Telephone Encounter (Signed)
lmom for pt to call back

## 2016-09-30 NOTE — Telephone Encounter (Signed)
Okay to schedule her for just a medication check per Dr. Regis Bill

## 2016-09-30 NOTE — Telephone Encounter (Signed)
Pt is pass due for annual exam. I sent in a one month supply for Topiramate. Pt needs appointment for further refills. Can you please schedule.

## 2016-10-01 ENCOUNTER — Telehealth: Payer: Self-pay | Admitting: Emergency Medicine

## 2016-10-01 NOTE — Telephone Encounter (Signed)
Pt is scheduled °

## 2016-10-01 NOTE — Telephone Encounter (Signed)
lmom for pt to call back

## 2016-10-01 NOTE — Telephone Encounter (Signed)
Rx has been sent to the pharmacy

## 2016-10-01 NOTE — Telephone Encounter (Signed)
Left message for pt to give the office a call back in reference to which pharmacy she is using.

## 2016-10-03 ENCOUNTER — Telehealth: Payer: Self-pay | Admitting: Emergency Medicine

## 2016-10-03 MED ORDER — TOPIRAMATE 50 MG PO TABS: ORAL_TABLET | ORAL | 0 refills | Status: DC

## 2016-10-03 NOTE — Telephone Encounter (Signed)
  Spoke with pt to verify which pharmacy she is currently using and she is using the [facility]. Also spoke with Pharmacist and cancel the order that was sent in. Resending rx to [facility].

## 2016-10-07 NOTE — Progress Notes (Signed)
Chief Complaint  Patient presents with  . Follow-up    HPI: Linda Becker 26 y.o.  Has hx of headaches   And migaines  Last ov was  8 17  seeb  Had 2 e visits for uti rx  macrobid and diflucan   And uri without exam   Given z pack for "bacterial bronchitis " !    1 7  "that could have tunred into pna "  Since I last saw her she's been diagnosed with bipolar disease and is been placed on different medications that are being adjusted and started. She went off the Topamax for the last week because of an interaction reported between Topamax and lithium. She is going up on her dose of lithium to a maximum of 900 and getting nausea feeling a bit drowsy. However now her headaches are back had been decreased to 1 a week or less and now they're back to daily. She really isn't taking anything except ibuprofen at this time. Uncertain what to do next. She does have a family history of bipolar. Her symptoms were more scattered moodiness up and down bipolar 2.  Meds  latuda, lithium x 1 week. , lamictal. , kelly  Is  Provider and   And new psych Dr  [facility]ette [facility]  [place] [address].      Nausea and tired is the most problematic  . Off  ROS: See pertinent positives and negatives per HPI. No cp sob  Her  Rapid HR up to 180 ? More frequent  Has hx of recurrent syncope and has seen dr Einar Gip in past    No meds    Has orthostatics and didn't want to use a b blocker?   Past Medical History:  Diagnosis Date  . Allergic rhinitis    remote hx of allergy shots  . Asthma    since childhood  on controller meds extrinsic dr Carmelina Peal  . GERD (gastroesophageal reflux disease)    on nexium  for  long term sx since childhood  . H/O miscarriage, not currently pregnant    [redacted] weeks  march 2016  . Migraine   . Murmur    pt reports MVP  . Syncope    under eval ? cause  . Tachycardia    episodes with near syncope eval dr Nadyne Coombes  on no meds dced LABA    Family History  Problem Relation  Age of Onset  . Asthma Father   . Arthritis Father   . Hyperlipidemia Father   . Heart disease Father   . Hypertension Father   . Diabetes Father   . Arthritis Mother   . Hyperlipidemia Mother   . Heart disease Mother   . Hypertension Mother   . Diabetes Mother   . Arthritis Maternal Grandmother   . Breast cancer Maternal Grandmother   . Colon cancer Paternal Grandfather   . Diabetes Paternal Grandfather   . Breast cancer Paternal Grandmother   . Diabetes Paternal Grandmother   . Breast cancer Paternal Aunt   . Breast cancer Paternal Aunt   . Diabetes Maternal Grandfather     Social History   Social History  . Marital status: Legally Separated    Spouse name: N/A  . Number of children: N/A  . Years of education: N/A   Social History Main Topics  . Smoking status: Never Smoker  . Smokeless tobacco: Never Used  . Alcohol use 0.0 oz/week  .  Drug use: No  . Sexual activity: No   Other Topics Concern  . 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 albuterol (PROVENTIL) (2.5 MG/3ML) 0.083% nebulizer solution Take 2.5 mg by nebulization every 6 (six) hours as needed for wheezing or shortness of breath.    . ALL DAY ALLERGY 10 MG tablet TAKE 1 TABLET (10 MG TOTAL) BY MOUTH DAILY. 30 tablet 5  . esomeprazole (NEXIUM) 40 MG capsule Take 1 capsule (40 mg total) by mouth daily. 30 capsule 3  . Fluticasone-Salmeterol (ADVAIR DISKUS) 500-50 MCG/DOSE AEPB INHALE ONE PUFF TWICE DAILY TO PREVENT COUGH OR WHEEZE. RINSE MOUTH AFTER USE. 1 each 5  . ibuprofen (ADVIL,MOTRIN) 200 MG tablet Take 600-800 mg by mouth every 6 (six) hours as needed. For pain    . mometasone (NASONEX) 50 MCG/ACT nasal spray Use one to two sprays  in each nostril once daily. 17 g 11  . montelukast (SINGULAIR) 10 MG tablet Take 1 tablet (10 mg total) by mouth daily. 90 tablet 1  . Olopatadine HCl (PATADAY) 0.2 % SOLN Apply 1 drop to eye 2 (two) times daily.    . promethazine (PHENERGAN) 25 MG tablet Take 1 tablet (25 mg total) by mouth every 6 (six) hours as needed for nausea or vomiting (headache). 30 tablet 0  . VENTOLIN HFA 108 (90 Base) MCG/ACT inhaler INHALE 2 PUFFS BY MOUTH EVERY 6 HOURS AS NEEDED FOR WHEEZING OR SHORTNESS OF BREATH. 18 g 1  . Vitamin D, Ergocalciferol, (DRISDOL) 50000 UNITS CAPS capsule 2 (two) times a week.  1  . Biotin (BIOTIN MAXIMUM STRENGTH) 10 MG TABS Take 1 tablet by mouth 2 (two) times daily.    . ondansetron (ZOFRAN) 4 MG tablet Take 1 tablet (4 mg total) by mouth every 8 (eight) hours as needed for nausea or vomiting. 20 tablet 0  . azithromycin (ZITHROMAX) 250 MG tablet Take 500 mg once, then 250 mg for four days 6 tablet 0  . benzonatate (TESSALON) 100 MG capsule Take 1 capsule (100 mg total) by mouth 2 (two) times daily as needed for cough. 20 capsule 0  . esomeprazole (NEXIUM) 40 MG capsule TAKE 1 CAPSULE BY MOUTH DAILY. 30 capsule 5  . Multiple Vitamin (MULTIVITAMIN) tablet Take 1 tablet by mouth daily.    . nitrofurantoin, macrocrystal-monohydrate, (MACROBID) 100 MG capsule Take 1 capsule (100 mg total) by mouth 2 (two) times daily. 1 po BId 14 capsule 0  . topiramate (TOPAMAX) 50 MG tablet Take 50 mg am 100 mg pm 0ra s directed (Patient not taking: Reported on 10/08/2016) 60 tablet 0   No facility-administered medications prior to visit.      EXAM:  BP 102/80 (BP Location: Right Arm, Patient Position: Sitting, Cuff Size: Large)   Pulse (!) 53   Ht 5' 6"  (1.676 m)   Wt 278 lb 9.6 oz (126.4 kg)   LMP 10/08/2016 (Exact Date)   BMI 44.97 kg/m   Body mass index is 44.97 kg/m.  GENERAL: vitals reviewed and listed above, alert, oriented, appears well hydrated and in no acute distress HEENT:  atraumatic, conjunctiva  clear, no  obvious abnormalities on inspection of external nose and ears  CV: HRRR, no clubbing cyanosis or  peripheral edema nl cap refill  MS: moves all extremities without noticeable focal  abnormality PSYCH: pleasant and cooperative, no obvious depression or anxiety neuro grossly non focal   ASSESSMENT AND PLAN:  Discussed the following assessment and plan:  Headache, unspecified headache type - Plan: AMB referral to headache clinic  Medication management - Plan: AMB referral to headache clinic  Migraine with aura and without status migrainosus, not intractable - Plan: AMB referral to headache clinic  Bipolar 2 disorder ([place]) - Dr Jeanette Caprice   newly on meds lithium   Syncope, unspecified syncope type  Hx of atrial tachycardia - per dr Einar Gip ? assoc with syncop?  She did get moderate relief with the Topamax although not complete relief. We reviewed the interactions of Topamax especially at higher doses increasing the lithium level and changing metabolism. This is not a contraindication but a caution. At this time we'll have her go back on low dose of Topamax as directed and range a referral to a headache evaluation specialist. Perhaps medication would be helpful for headache suppression and her bipolar at the specialists work together. May take some  Communication and finesse between experts to get the best benefit risk  meds for her situation.  She has a history of recurrent syncope associated with tachycardia and has seen cardiology but I don't have those records. Sounds like SVT. Have her get back with her cardiologist is about recurrent problem and if he needs a medications are problematic for her situation. Her last chemistry was a year ago should be done when she has her lithium level. We'll get back with [place] in the interim as needed before specialty care. She should contact her psychiatrist about the Topamax  restarted. Patient  Voices understanding of plan .   -Patient advised to return or notify health care team  if symptoms worsen ,persist or new concerns arise. she is aware pregnancy Ci with med.  Patient Instructions   Going back  On low dose topamax .     May   Raise lithium  Level    But as we discussed usually at higher doses.  Begin 50 at night  Increase to 100 at night after  5 days and option to 50 am and 100 pm.  We will   Do a referral to the headache specialist for further help with   Managing these medications  And control.  ?   Other meds would do better .   alsp  get back w cardiology about the tachycardia  .     You may blood work and   leves done at some point.   Advise chemistry panel at some point.  Lab Results  Component Value Date   WBC 7.6 09/05/2015   HGB 13.4 09/05/2015   HCT 39.6 09/05/2015   PLT 187.0 09/05/2015   GLUCOSE 87 09/05/2015   CHOL 183 09/05/2015   TRIG 78.0 09/05/2015   HDL 50.30 09/05/2015   LDLCALC 117 (H) 09/05/2015   ALT 17 09/05/2015   AST 16 09/05/2015   NA 142 09/05/2015   K 4.2 09/05/2015   CL 111 09/05/2015   CREATININE 0.83 09/05/2015   BUN 11 09/05/2015   CO2 23 09/05/2015   TSH 2.16 09/05/2015   HGBA1C 5.1 09/05/2015       Mariann Laster K. Panosh M.D.

## 2016-10-08 ENCOUNTER — Ambulatory Visit (INDEPENDENT_AMBULATORY_CARE_PROVIDER_SITE_OTHER): Payer: 59 | Admitting: Internal Medicine

## 2016-10-08 ENCOUNTER — Encounter: Payer: Self-pay | Admitting: Internal Medicine

## 2016-10-08 VITALS — BP 102/80 | HR 53 | Ht 66.0 in | Wt 278.6 lb

## 2016-10-08 DIAGNOSIS — G43109 Migraine with aura, not intractable, without status migrainosus: Secondary | ICD-10-CM | POA: Diagnosis not present

## 2016-10-08 DIAGNOSIS — R51 Headache: Secondary | ICD-10-CM

## 2016-10-08 DIAGNOSIS — Z8679 Personal history of other diseases of the circulatory system: Secondary | ICD-10-CM

## 2016-10-08 DIAGNOSIS — R55 Syncope and collapse: Secondary | ICD-10-CM

## 2016-10-08 DIAGNOSIS — Z79899 Other long term (current) drug therapy: Secondary | ICD-10-CM | POA: Diagnosis not present

## 2016-10-08 DIAGNOSIS — F3181 Bipolar II disorder: Secondary | ICD-10-CM

## 2016-10-08 DIAGNOSIS — R519 Headache, unspecified: Secondary | ICD-10-CM

## 2016-10-08 MED ORDER — TOPIRAMATE 50 MG PO TABS: ORAL_TABLET | ORAL | 1 refills | Status: DC

## 2016-10-08 MED FILL — TOPIRAMATE 50 MG TABLET: 50 | 30 days supply | Qty: 90 | Fill #0

## 2016-10-08 NOTE — Patient Instructions (Addendum)
Going back  On low dose topamax .     May   Raise lithium  Level    But as we discussed usually at higher doses.  Begin 50 at night  Increase to 100 at night after  5 days and option to 50 am and 100 pm.  We will   Do a referral to the headache specialist for further help with   Managing these medications  And control.  ?   Other meds would do better .   alsp  get back w cardiology about the tachycardia  .     You may blood work and   leves done at some point.   Advise chemistry panel at some point.  Lab Results  Component Value Date   WBC 7.6 09/05/2015   HGB 13.4 09/05/2015   HCT 39.6 09/05/2015   PLT 187.0 09/05/2015   GLUCOSE 87 09/05/2015   CHOL 183 09/05/2015   TRIG 78.0 09/05/2015   HDL 50.30 09/05/2015   LDLCALC 117 (H) 09/05/2015   ALT 17 09/05/2015   AST 16 09/05/2015   NA 142 09/05/2015   K 4.2 09/05/2015   CL 111 09/05/2015   CREATININE 0.83 09/05/2015   BUN 11 09/05/2015   CO2 23 09/05/2015   TSH 2.16 09/05/2015   HGBA1C 5.1 09/05/2015

## 2016-10-09 ENCOUNTER — Other Ambulatory Visit: Payer: Self-pay | Admitting: Allergy and Immunology

## 2016-10-09 MED FILL — VENTOLIN HFA 90 MCG INHALER: 108 (90 BAS | 25 days supply | Qty: 18 | Fill #1

## 2016-10-09 MED FILL — MONTELUKAST SOD 10 MG TAB: 10 | 90 days supply | Qty: 90 | Fill #0

## 2016-10-09 MED FILL — ESOMEPRAZOLE MAG DR 40 MG C: 40 | 30 days supply | Qty: 30 | Fill #2

## 2016-10-09 MED FILL — ADVAIR 500/50 DISKUS: 500-50 | 30 days supply | Qty: 60 | Fill #0

## 2016-10-14 ENCOUNTER — Encounter: Payer: Self-pay | Admitting: Internal Medicine

## 2016-10-14 NOTE — Telephone Encounter (Signed)
Dr. Regis Bill - Please advise. Thanks!

## 2016-10-20 DIAGNOSIS — E559 Vitamin D deficiency, unspecified: Secondary | ICD-10-CM | POA: Diagnosis not present

## 2016-10-20 DIAGNOSIS — F3181 Bipolar II disorder: Secondary | ICD-10-CM | POA: Diagnosis not present

## 2016-10-20 DIAGNOSIS — Z79899 Other long term (current) drug therapy: Secondary | ICD-10-CM | POA: Diagnosis not present

## 2016-10-21 NOTE — Telephone Encounter (Signed)
It would be best   For  Her specialist ( cardiology ) in regard to the tachy nad syncope   Do fmla related to that problem   And when she gets to the  Headache specialist   Have the do   FMLA for the migraines.   I can  Write a note  Stating you have  These conditions and  referring to specialist  If that is needed in the interim .

## 2016-10-24 DIAGNOSIS — F429 Obsessive-compulsive disorder, unspecified: Secondary | ICD-10-CM | POA: Diagnosis not present

## 2016-10-24 DIAGNOSIS — F3181 Bipolar II disorder: Secondary | ICD-10-CM | POA: Diagnosis not present

## 2016-11-06 MED FILL — TOPIRAMATE 50 MG TABLET: 50 | 30 days supply | Qty: 90 | Fill #1

## 2016-11-06 MED FILL — ADVAIR 500/50 DISKUS: 500-50 | 30 days supply | Qty: 60 | Fill #1

## 2016-11-06 MED FILL — LITHIUM CARBONATE ER 300 MG: 300 | 30 days supply | Qty: 90 | Fill #0

## 2016-11-06 MED FILL — ESOMEPRAZOLE MAG DR 40 MG C: 40 | 30 days supply | Qty: 30 | Fill #3

## 2016-11-06 MED FILL — lamoTRIgine 100 MG TABS: 100 | 30 days supply | Qty: 30 | Fill #1

## 2016-11-20 DIAGNOSIS — F3181 Bipolar II disorder: Secondary | ICD-10-CM | POA: Diagnosis not present

## 2016-11-20 DIAGNOSIS — F429 Obsessive-compulsive disorder, unspecified: Secondary | ICD-10-CM | POA: Diagnosis not present

## 2016-11-27 MED FILL — LIOTHYRONINE SODIUM 25 MCG: 25 | 30 days supply | Qty: 60 | Fill #0

## 2016-11-27 MED FILL — VIT D3-50 50,000 UNITS CAPS: 1.25 MG | 35 days supply | Qty: 5 | Fill #0

## 2016-12-01 MED FILL — NORETHINDRONE 0.35 MG TAB: 0.35 | 84 days supply | Qty: 84 | Fill #1

## 2016-12-04 MED FILL — LITHIUM CARBONATE ER 300 MG: 300 | 30 days supply | Qty: 90 | Fill #1

## 2016-12-15 ENCOUNTER — Other Ambulatory Visit: Payer: Self-pay | Admitting: Allergy and Immunology

## 2016-12-15 ENCOUNTER — Other Ambulatory Visit: Payer: Self-pay | Admitting: Internal Medicine

## 2016-12-15 MED FILL — ADVAIR 500/50 DISKUS: 500-50 | 30 days supply | Qty: 60 | Fill #2

## 2016-12-15 MED FILL — ESOMEPRAZOLE MAG DR 40 MG C: 40 | 30 days supply | Qty: 30 | Fill #4

## 2016-12-15 MED FILL — VENTOLIN HFA 90 MCG INHALER: 108 (90 BAS | 25 days supply | Qty: 18 | Fill #0

## 2016-12-15 NOTE — Telephone Encounter (Signed)
Patient needs office visit.

## 2016-12-16 MED FILL — lamoTRIgine 100 MG TABS: 100 | 30 days supply | Qty: 30 | Fill #0

## 2016-12-16 NOTE — Telephone Encounter (Signed)
Upon record review the plan was to refer to a headache specialist and we restarted low-dose Topamax in the interim. Please contact patient and assess which /when headache specialist she is seen    would be best for them to take over  rx her Topamax  Prescription as they see fit   i  please update her med list.

## 2016-12-17 DIAGNOSIS — F3181 Bipolar II disorder: Secondary | ICD-10-CM | POA: Diagnosis not present

## 2016-12-17 DIAGNOSIS — F429 Obsessive-compulsive disorder, unspecified: Secondary | ICD-10-CM | POA: Diagnosis not present

## 2016-12-18 NOTE — Telephone Encounter (Signed)
Left message on machine for patient to return our call 

## 2016-12-18 NOTE — Telephone Encounter (Signed)
Pt states she is going to the [facility].  Does not know who the dr will be. appt is  August 16 Would like a refill until then  [place], [place].

## 2016-12-18 NOTE — Telephone Encounter (Signed)
Please refill enough unti she sees headache specialist  If doing ok.

## 2016-12-19 MED FILL — TOPIRAMATE 50 MG TABLET: 50 | 30 days supply | Qty: 90 | Fill #0

## 2016-12-23 ENCOUNTER — Telehealth: Payer: Self-pay

## 2016-12-23 ENCOUNTER — Other Ambulatory Visit: Payer: Self-pay

## 2016-12-23 ENCOUNTER — Telehealth: Payer: Self-pay | Admitting: Allergy and Immunology

## 2016-12-23 MED ORDER — ALBUTEROL SULFATE (2.5 MG/3ML) 0.083% IN NEBU: INHALATION_SOLUTION | RESPIRATORY_TRACT | 1 refills | Status: DC

## 2016-12-23 NOTE — Telephone Encounter (Signed)
Patient has an appt 12-31-16, but needs the albuterol liquid for her nebulizer to get her through to her appt. If you call it in before 5:00, she wants it called in to [place]. If it is called in after 5:00, she wants it called in to [place].

## 2016-12-23 NOTE — Telephone Encounter (Signed)
Sent in refill for Albuterol

## 2016-12-23 NOTE — Telephone Encounter (Signed)
I called patient to confirm medication need and pharmacy.. She said she has gotten worse as the day has gone on.  Profusely sweating, shakiness, chest tightness, pulse ox of 92 %. I advised her that she should go to emergency room since her symptoms have gotten worse and that it would be good to have someone see her tonight.  Patient agreed.  She is scheduled to see Dr.Kozlow next week but might go ahead and call us tomorrow if she needs to be seen sooner. I will send Rx for Albuterol as requested.

## 2016-12-30 MED FILL — LITHIUM CARBONATE ER 300 MG: 300 | 30 days supply | Qty: 90 | Fill #0

## 2016-12-30 MED FILL — LIOTHYRONINE SODIUM 25 MCG: 25 | 30 days supply | Qty: 60 | Fill #1

## 2016-12-31 ENCOUNTER — Encounter: Payer: Self-pay | Admitting: Allergy and Immunology

## 2016-12-31 ENCOUNTER — Ambulatory Visit (INDEPENDENT_AMBULATORY_CARE_PROVIDER_SITE_OTHER): Payer: 59 | Admitting: Allergy and Immunology

## 2016-12-31 VITALS — BP 116/72 | HR 82 | Temp 98.1°F | Resp 19 | Ht 65.5 in | Wt 281.2 lb

## 2016-12-31 DIAGNOSIS — J4550 Severe persistent asthma, uncomplicated: Secondary | ICD-10-CM | POA: Diagnosis not present

## 2016-12-31 DIAGNOSIS — H1010 Acute atopic conjunctivitis, unspecified eye: Secondary | ICD-10-CM

## 2016-12-31 DIAGNOSIS — H1045 Other chronic allergic conjunctivitis: Secondary | ICD-10-CM | POA: Diagnosis not present

## 2016-12-31 DIAGNOSIS — J3089 Other allergic rhinitis: Secondary | ICD-10-CM

## 2016-12-31 MED ORDER — METHYLPREDNISOLONE ACETATE 80 MG/ML IJ SUSP
80.0000 mg | Freq: Once | INTRAMUSCULAR | Status: AC
  Administered 2016-12-31: 80 mg via INTRAMUSCULAR

## 2016-12-31 NOTE — Patient Instructions (Addendum)
  1. Continue Advair 500 one inhalation two times per day    2. Continue montelukast 38m one tablet one time per day  3. Continue Nexium 40 mg one tablet twice a day  4. Continue Nasonex 1-2 sprays each nostril 3-7 times per day  5. Continue ProAir HFA 2 inhalations every 4-6 hours of needed.  6. Continue antihistamine if needed.  7. Taper off ALL caffeine  8. Depomedrol 80 IM delivered in clinic today  9. Blood - CBC w/diff, IgE today. Mepolizumab or Benralizumab  10. Return in 2 weeks or earlier if problem.

## 2016-12-31 NOTE — Progress Notes (Signed)
Follow-up Note  Referring Provider: Burnis Medin, MD Primary Provider: Burnis Medin, MD Date of Office Visit: 12/31/2016  Subjective:   Linda Becker (DOB: May 21, 1991) is a 26 y.o. female who returns to the [facility] on 12/31/2016 in re-evaluation of the following:  HPI: Linda Becker returns to this clinic in reevaluation of her asthma and allergic rhinoconjunctivitis and reflux. Her last visit to this clinic was almost 1 year ago.  Overall she has had a very good year and has not required either a systemic steroid or an antibiotic to treat any type of respiratory tract issue. While using Advair 500 she thought that she had excellent control of her asthma through the year. Unfortunately, in June she started to develop problems with wheezing and coughing and developing shortness of breath for which she increased her bronchodilator use to 3-4 times per day. In addition, she has had a little bit more problems with sneezing and runny nose and itchy eyes during this timeframe. There has not really been a significant environmental change that has occurred that may account for this increased atopic activity.  Her reflux may be marginally more active. There might be some burning up in her throat. She still drinks tea one time per day. She's using Nexium twice a day.  Allergies as of 12/31/2016   No Known Allergies     Medication List      ADVAIR DISKUS 500-50 MCG/DOSE Aepb Generic drug:  Fluticasone-Salmeterol INHALE ONE PUFF TWICE DAILY TO PREVENT COUGH OR WHEEZE. RINSE MOUTH AFTER USE.   ALL DAY ALLERGY 10 MG tablet Generic drug:  cetirizine TAKE 1 TABLET (10 MG TOTAL) BY MOUTH DAILY.   esomeprazole 40 MG capsule Commonly known as:  NEXIUM Take 1 capsule (40 mg total) by mouth daily.   ibuprofen 200 MG tablet Commonly known as:  ADVIL,MOTRIN Take 600-800 mg by mouth every 6 (six) hours as needed. For pain   lamoTRIgine 100 MG tablet Commonly known  as:  LAMICTAL   LATUDA 40 MG Tabs tablet Generic drug:  lurasidone   lithium 600 MG capsule Take 600 mg by mouth 2 (two) times daily with a meal.   mometasone 50 MCG/ACT nasal spray Commonly known as:  NASONEX Use one to two sprays in each nostril once daily.   montelukast 10 MG tablet Commonly known as:  SINGULAIR TAKE 1 TABLET (10 MG TOTAL) BY MOUTH DAILY.   norethindrone 0.35 MG tablet Commonly known as:  MICRONOR,CAMILA,ERRIN   OMEGA 3 PO Take by mouth 3 (three) times daily.   PATADAY 0.2 % Soln Generic drug:  Olopatadine HCl Apply 1 drop to eye 2 (two) times daily.   PRE-NATAL PO Take by mouth daily.   PROBIOTIC PO Take by mouth daily.   promethazine 25 MG tablet Commonly known as:  PHENERGAN Take 1 tablet (25 mg total) by mouth every 6 (six) hours as needed for nausea or vomiting (headache).   topiramate 50 MG tablet Commonly known as:  TOPAMAX TAKE 1 TABLET BY MOUTH IN THE AM AND 2 TABLETS IN THE PM AS DIRECTED   VENTOLIN HFA 108 (90 Base) MCG/ACT inhaler Generic drug:  albuterol INHALE 2 PUFFS BY MOUTH EVERY 6 HOURS AS NEEDED FOR WHEEZING OR SHORTNESS OF BREATH.   albuterol (2.5 MG/3ML) 0.083% nebulizer solution Commonly known as:  PROVENTIL Inhale the contents of one vial in nebulizer every four to six hours as needed for cough or wheeze.   Vitamin D (Ergocalciferol)  50000 units Caps capsule Commonly known as:  DRISDOL 2 (two) times a week.       Past Medical History:  Diagnosis Date  . Allergic rhinitis    remote hx of allergy shots  . Asthma    since childhood  on controller meds extrinsic dr Carmelina Peal  . GERD (gastroesophageal reflux disease)    on nexium  for  long term sx since childhood  . H/O miscarriage, not currently pregnant    [redacted] weeks  march 2016  . Migraine   . Murmur    pt reports MVP  . Syncope    under eval ? cause  . Tachycardia    episodes with near syncope eval dr Nadyne Coombes  on no meds dced LABA    Past Surgical History:    Procedure Laterality Date  . broken right femur  2008   rod placed  . TONSILLECTOMY  2006  . WISDOM TOOTH EXTRACTION      Review of systems negative except as noted in HPI / PMHx or noted below:  Review of Systems  Constitutional: Negative.   HENT: Negative.   Eyes: Negative.   Respiratory: Negative.   Cardiovascular: Negative.   Gastrointestinal: Negative.   Genitourinary: Negative.   Musculoskeletal: Negative.   Skin: Negative.   Neurological: Negative.   Endo/Heme/Allergies: Negative.   Psychiatric/Behavioral:       She is presently using lithium for what sounds like bipolar disorder.     Objective:   Vitals:   12/31/16 1021  BP: 116/72  Pulse: 82  Resp: 19  Temp: 98.1 F (36.7 C)   Height: 5' 5.5" (166.4 cm)  Weight: 281 lb 3.2 oz (127.6 kg)   Physical Exam  Constitutional: She is well-developed, well-nourished, and in no distress.  HENT:  Head: Normocephalic.  Right Ear: Tympanic membrane, external ear and ear canal normal.  Left Ear: Tympanic membrane, external ear and ear canal normal.  Nose: Nose normal. No mucosal edema or rhinorrhea.  Mouth/Throat: Uvula is midline, oropharynx is clear and moist and mucous membranes are normal. No oropharyngeal exudate.  Eyes: Conjunctivae are normal.  Neck: Trachea normal. No tracheal tenderness present. No tracheal deviation present. No thyromegaly present.  Cardiovascular: Normal rate, regular rhythm, S1 normal, S2 normal and normal heart sounds.   No murmur heard. Pulmonary/Chest: No stridor. No respiratory distress. She has wheezes (end expiratory wheezing heard in all lung fields). She has no rales.  Musculoskeletal: She exhibits no edema.  Lymphadenopathy:       Head (right side): No tonsillar adenopathy present.       Head (left side): No tonsillar adenopathy present.    She has no cervical adenopathy.  Neurological: She is alert. Gait normal.  Skin: No rash noted. She is not diaphoretic. No erythema.  Nails show no clubbing.  Psychiatric: Mood and affect normal.    Diagnostics:    Spirometry was performed and demonstrated an FEV1 of 3.43 at 99 % of predicted.  The patient had an Asthma Control Test with the following results: ACT Total Score: 11.    Assessment and Plan:   1. Other allergic rhinitis   2. Seasonal allergic conjunctivitis   3. Not well controlled severe persistent asthma     1. Continue Advair 500 one inhalation two times per day    2. Continue montelukast 58m one tablet one time per day  3. Continue Nexium 40 mg one tablet twice a day  4. Continue Nasonex 1-2 sprays each nostril 3-7  times per day  5. Continue ProAir HFA 2 inhalations every 4-6 hours of needed.  6. Continue antihistamine if needed.  7. Taper off ALL caffeine  8. Depomedrol 80 IM delivered in clinic today  9. Blood - CBC w/diff, IgE today. Mepolizumab or Benralizumab  10. Return in 2 weeks or earlier if problem.   Linda Becker still has active asthma in the face of utilizing very good anti-inflammatory medications and we will now see if she is a candidate for a biological agent. I have also given her a systemic steroid today as she appears to have developed significant inflammation of her respiratory tract in the past 6 weeks or so. She will continue to treat her reflux and I made a recommendation that she stop all caffeine as this is probably contributing to some of her reflux issues. I will contact her with the results of her blood tests once they are available for review and see her back in this clinic in 2 weeks or earlier if there is a problem.  Allena Katz, MD Allergy / Immunology [place]

## 2017-01-02 ENCOUNTER — Telehealth: Payer: No Typology Code available for payment source | Admitting: Family

## 2017-01-02 ENCOUNTER — Ambulatory Visit: Payer: 59 | Admitting: Internal Medicine

## 2017-01-02 DIAGNOSIS — H5710 Ocular pain, unspecified eye: Secondary | ICD-10-CM

## 2017-01-02 NOTE — Progress Notes (Signed)
Thank you for the details you put in the comment boxes. Those details really help [place] take better care of you. This is something that must be seen face-to-face with a special slit-lamp to examine your eye for damage.  Based on what you shared with me it looks like you have a serious condition that should be evaluated in a face to face office visit.  NOTE: Even if you have entered your credit card information for this eVisit, you will not be charged.   If you are having a true medical emergency please call 911.  If you need an urgent face to face visit, [facility] has four urgent care centers for your convenience.  If you need care fast and have a high deductible or no insurance consider:   [email or web address]  [phone]  [address], [place], [place] [address] 8 am to 8 pm Monday-Friday 10 am to 4 pm Saturday-Sunday   The following sites will take your  insurance:    . [facility] [place] a Provider at this Location  [address] [place], [place] [address] . 10 am to 8 pm Monday-Friday . 12 pm to 8 pm Saturday-Sunday   . [facility] at [place] a Provider at this Location  [place], [place], [place] [address] . 8 am to 8 pm Monday-Friday . 9 am to 6 pm Saturday . 11 am to 6 pm Sunday   .  [facility] at [place] Get Driving Directions  [address] [place], [place] [address] . 8 am to 8 pm Monday-Friday . 8 am to 4 pm Saturday-Sunday   Your e-visit answers were reviewed by a board certified advanced clinical practitioner to complete your personal care plan.  Thank you for using e-Visits.

## 2017-01-03 LAB — CBC AND DIFFERENTIAL
HEMATOCRIT: 45 (ref 36–46)
HEMOGLOBIN: 14.3 (ref 12.0–16.0)
Neutrophils Absolute: 6
Platelets: 256 (ref 150–399)
WBC: 8.7

## 2017-01-03 LAB — CBC WITH DIFFERENTIAL/PLATELET
BASOS ABS: 0 10*3/uL (ref 0.0–0.2)
BASOS: 0 %
EOS (ABSOLUTE): 0.3 10*3/uL (ref 0.0–0.4)
Eos: 3 %
Hematocrit: 45 % (ref 34.0–46.6)
Hemoglobin: 14.3 g/dL (ref 11.1–15.9)
IMMATURE GRANULOCYTES: 0 %
Immature Grans (Abs): 0 10*3/uL (ref 0.0–0.1)
LYMPHS: 24 %
Lymphocytes Absolute: 2.1 10*3/uL (ref 0.7–3.1)
MCH: 31.1 pg (ref 26.6–33.0)
MCHC: 31.8 g/dL (ref 31.5–35.7)
MCV: 98 fL — AB (ref 79–97)
MONOS ABS: 0.5 10*3/uL (ref 0.1–0.9)
Monocytes: 5 %
NEUTROS PCT: 68 %
Neutrophils Absolute: 5.8 10*3/uL (ref 1.4–7.0)
PLATELETS: 256 10*3/uL (ref 150–379)
RBC: 4.6 x10E6/uL (ref 3.77–5.28)
RDW: 12.7 % (ref 12.3–15.4)
WBC: 8.7 10*3/uL (ref 3.4–10.8)

## 2017-01-03 LAB — IGE: IGE (IMMUNOGLOBULIN E), SERUM: 29 [IU]/mL (ref 0–100)

## 2017-01-05 DIAGNOSIS — F3181 Bipolar II disorder: Secondary | ICD-10-CM | POA: Diagnosis not present

## 2017-01-05 DIAGNOSIS — F429 Obsessive-compulsive disorder, unspecified: Secondary | ICD-10-CM | POA: Diagnosis not present

## 2017-01-09 DIAGNOSIS — G43119 Migraine with aura, intractable, without status migrainosus: Secondary | ICD-10-CM | POA: Diagnosis not present

## 2017-01-09 DIAGNOSIS — G43839 Menstrual migraine, intractable, without status migrainosus: Secondary | ICD-10-CM | POA: Diagnosis not present

## 2017-01-09 DIAGNOSIS — G43719 Chronic migraine without aura, intractable, without status migrainosus: Secondary | ICD-10-CM | POA: Diagnosis not present

## 2017-01-09 DIAGNOSIS — Z049 Encounter for examination and observation for unspecified reason: Secondary | ICD-10-CM | POA: Diagnosis not present

## 2017-01-09 MED FILL — lamoTRIgine 100 MG TABS: 100 | 30 days supply | Qty: 30 | Fill #0

## 2017-01-09 MED FILL — BACLOFEN 10 MG TABLET: 10 | 10 days supply | Qty: 20 | Fill #0

## 2017-01-09 MED FILL — ZONISAMIDE 25 MG CAPSULE: 25 | 30 days supply | Qty: 120 | Fill #0

## 2017-01-13 ENCOUNTER — Other Ambulatory Visit: Payer: Self-pay | Admitting: *Deleted

## 2017-01-13 MED ORDER — BENRALIZUMAB 30 MG/ML ~~LOC~~ SOSY: 30.0000 mg | PREFILLED_SYRINGE | SUBCUTANEOUS | 8 refills | Status: DC

## 2017-01-13 MED FILL — FASENRA 30 MG/ML SOSY: 30 | 28 days supply | Qty: 1 | Fill #0

## 2017-01-14 ENCOUNTER — Other Ambulatory Visit: Payer: Self-pay | Admitting: Allergy and Immunology

## 2017-01-14 MED FILL — ADVAIR 500/50 DISKUS: 500-50 | 30 days supply | Qty: 60 | Fill #3

## 2017-01-14 MED FILL — VIT D3-50 50,000 UNITS CAPS: 1.25 MG | 35 days supply | Qty: 5 | Fill #0

## 2017-01-14 MED FILL — MONTELUKAST SOD 10 MG TAB: 10 | 90 days supply | Qty: 90 | Fill #0

## 2017-01-14 MED FILL — ESOMEPRAZOLE MAG DR 40 MG C: 40 | 30 days supply | Qty: 30 | Fill #5

## 2017-01-14 MED FILL — VENTOLIN HFA 90 MCG INHALER: 108 (90 BAS | 25 days supply | Qty: 18 | Fill #0

## 2017-01-16 ENCOUNTER — Ambulatory Visit (INDEPENDENT_AMBULATORY_CARE_PROVIDER_SITE_OTHER): Payer: 59

## 2017-01-16 DIAGNOSIS — J4540 Moderate persistent asthma, uncomplicated: Secondary | ICD-10-CM

## 2017-01-16 MED ORDER — BENRALIZUMAB 30 MG/ML ~~LOC~~ SOSY
30.0000 mg | PREFILLED_SYRINGE | Freq: Once | SUBCUTANEOUS | Status: AC
  Administered 2017-01-16: 30 mg via SUBCUTANEOUS

## 2017-01-16 MED ORDER — EPINEPHRINE 0.3 MG/0.3ML IJ SOAJ: 0.3000 mg | Freq: Once | INTRAMUSCULAR | 2 refills | Status: AC

## 2017-01-16 MED FILL — EPINEPHRINE 0.3 MG AUTO-INJ: 0.3 | 25 days supply | Qty: 2 | Fill #0

## 2017-01-16 NOTE — Progress Notes (Signed)
Immunotherapy   Patient Details  Name: Linda Becker MRN: [id number] Date of Birth: [birth year]  01/16/2017  [name] started injections for  Fasenra 13m.  Frequency: every 28 days for 3 weeks, then every 8 weeks.  Epi-Pen:Epi-Pen Available  Consent signed and patient instructions given.  Pt waited one hour without any issues.   Romeo Zielinski 01/16/2017, 2:47 PM

## 2017-01-21 ENCOUNTER — Encounter: Payer: Self-pay | Admitting: Allergy and Immunology

## 2017-01-21 ENCOUNTER — Ambulatory Visit (INDEPENDENT_AMBULATORY_CARE_PROVIDER_SITE_OTHER): Payer: 59 | Admitting: Allergy and Immunology

## 2017-01-21 VITALS — BP 118/78 | HR 81 | Resp 18

## 2017-01-21 DIAGNOSIS — M791 Myalgia: Secondary | ICD-10-CM | POA: Diagnosis not present

## 2017-01-21 DIAGNOSIS — J3089 Other allergic rhinitis: Secondary | ICD-10-CM

## 2017-01-21 DIAGNOSIS — Z049 Encounter for examination and observation for unspecified reason: Secondary | ICD-10-CM | POA: Diagnosis not present

## 2017-01-21 DIAGNOSIS — G43719 Chronic migraine without aura, intractable, without status migrainosus: Secondary | ICD-10-CM | POA: Diagnosis not present

## 2017-01-21 DIAGNOSIS — M542 Cervicalgia: Secondary | ICD-10-CM | POA: Diagnosis not present

## 2017-01-21 DIAGNOSIS — G43119 Migraine with aura, intractable, without status migrainosus: Secondary | ICD-10-CM | POA: Diagnosis not present

## 2017-01-21 DIAGNOSIS — J4550 Severe persistent asthma, uncomplicated: Secondary | ICD-10-CM

## 2017-01-21 DIAGNOSIS — F429 Obsessive-compulsive disorder, unspecified: Secondary | ICD-10-CM | POA: Diagnosis not present

## 2017-01-21 DIAGNOSIS — G43839 Menstrual migraine, intractable, without status migrainosus: Secondary | ICD-10-CM | POA: Diagnosis not present

## 2017-01-21 DIAGNOSIS — K219 Gastro-esophageal reflux disease without esophagitis: Secondary | ICD-10-CM

## 2017-01-21 DIAGNOSIS — R51 Headache: Secondary | ICD-10-CM | POA: Diagnosis not present

## 2017-01-21 DIAGNOSIS — F3181 Bipolar II disorder: Secondary | ICD-10-CM | POA: Diagnosis not present

## 2017-01-21 NOTE — Progress Notes (Signed)
Follow-up Note  Referring Provider: Burnis Medin, MD Primary Provider: Burnis Medin, MD Date of Office Visit: 01/21/2017  Subjective:   Linda Becker (DOB: 03-08-1991) is a 26 y.o. female who returns to the [facility] on 01/21/2017 in re-evaluation of the following:  HPI: Linda Becker returns to this clinic in reevaluation of her asthma and allergic rhinoconjunctivitis and reflux addressed during her last evaluation of 12/31/2016. During the interval she has started Benralizumab injections. She had her first injection several days ago.  She is better. She only uses her bronchodilator every other day at this point in time. She continues on a large collection of medical therapy established during her last visit.  Reflux is marginally better while using her Nexium twice a day. She still continues to drink a caffeinated drink per day.  Allergies as of 01/21/2017   No Known Allergies     Medication List      ADVAIR DISKUS 500-50 MCG/DOSE Aepb Generic drug:  Fluticasone-Salmeterol INHALE ONE PUFF TWICE DAILY TO PREVENT COUGH OR WHEEZE. RINSE MOUTH AFTER USE.   albuterol (2.5 MG/3ML) 0.083% nebulizer solution Commonly known as:  PROVENTIL Inhale the contents of one vial in nebulizer every four to six hours as needed for cough or wheeze.   VENTOLIN HFA 108 (90 Base) MCG/ACT inhaler Generic drug:  albuterol INHALE 2 PUFFS BY MOUTH EVERY 6 HOURS AS NEEDED FOR WHEEZING OR SHORTNESS OF BREATH.   ALL DAY ALLERGY 10 MG tablet Generic drug:  cetirizine TAKE 1 TABLET (10 MG TOTAL) BY MOUTH DAILY.   baclofen 10 MG tablet Commonly known as:  LIORESAL TAKE ONE TABLET BY MOUTH TWICE DAILY LIMIT 2 DAYS PER WEEK   Benralizumab 30 MG/ML Sosy Commonly known as:  FASENRA Inject 30 mg into the skin every 28 (twenty-eight) days. For 3 doses. Then 74m every 8 weeks   esomeprazole 40 MG capsule Commonly known as:  NEXIUM Take 1 capsule (40 mg total) by mouth  daily.   ibuprofen 200 MG tablet Commonly known as:  ADVIL,MOTRIN Take 600-800 mg by mouth every 6 (six) hours as needed. For pain   lamoTRIgine 100 MG tablet Commonly known as:  LAMICTAL   LATUDA 40 MG Tabs tablet Generic drug:  lurasidone   lithium 600 MG capsule Take 600 mg by mouth 2 (two) times daily with a meal.   mometasone 50 MCG/ACT nasal spray Commonly known as:  NASONEX Use one to two sprays in each nostril once daily.   montelukast 10 MG tablet Commonly known as:  SINGULAIR TAKE 1 TABLET (10 MG TOTAL) BY MOUTH DAILY.   norethindrone 0.35 MG tablet Commonly known as:  MICRONOR,CAMILA,ERRIN   OMEGA 3 PO Take by mouth 3 (three) times daily.   PATADAY 0.2 % Soln Generic drug:  Olopatadine HCl Apply 1 drop to eye 2 (two) times daily.   PRE-NATAL PO Take by mouth daily.   PROBIOTIC PO Take by mouth daily.   promethazine 25 MG tablet Commonly known as:  PHENERGAN Take 1 tablet (25 mg total) by mouth every 6 (six) hours as needed for nausea or vomiting (headache).   topiramate 50 MG tablet Commonly known as:  TOPAMAX TAKE 1 TABLET BY MOUTH IN THE AM AND 2 TABLETS IN THE PM AS DIRECTED   Vitamin D (Ergocalciferol) 50000 units Caps capsule Commonly known as:  DRISDOL 2 (two) times a week.   zonisamide 25 MG capsule Commonly known as:  ZONEGRAN TAKE 4 CAPSULES BY  MOUTH DAILY AT BEDTIME FOR 30 DAYS       Past Medical History:  Diagnosis Date  . Allergic rhinitis    remote hx of allergy shots  . Asthma    since childhood  on controller meds extrinsic dr Carmelina Peal  . GERD (gastroesophageal reflux disease)    on nexium  for  long term sx since childhood  . H/O miscarriage, not currently pregnant    [redacted] weeks  march 2016  . Migraine   . Murmur    pt reports MVP  . Syncope    under eval ? cause  . Tachycardia    episodes with near syncope eval dr Nadyne Coombes  on no meds dced LABA    Past Surgical History:  Procedure Laterality Date  . broken right femur   2008   rod placed  . TONSILLECTOMY  2006  . WISDOM TOOTH EXTRACTION      Review of systems negative except as noted in HPI / PMHx or noted below:  Review of Systems  Constitutional: Negative.   HENT: Negative.   Eyes: Negative.   Respiratory: Negative.   Cardiovascular: Negative.   Gastrointestinal: Negative.   Genitourinary: Negative.   Musculoskeletal: Negative.   Skin: Negative.   Neurological: Negative.   Endo/Heme/Allergies: Negative.   Psychiatric/Behavioral: Negative.      Objective:   Vitals:   01/21/17 1027  BP: 118/78  Pulse: 81  Resp: 18          Physical Exam  Constitutional: She is well-developed, well-nourished, and in no distress.  HENT:  Head: Normocephalic.  Right Ear: Tympanic membrane, external ear and ear canal normal.  Left Ear: Tympanic membrane, external ear and ear canal normal.  Nose: Nose normal. No mucosal edema or rhinorrhea.  Mouth/Throat: Uvula is midline, oropharynx is clear and moist and mucous membranes are normal. No oropharyngeal exudate.  Eyes: Conjunctivae are normal.  Neck: Trachea normal. No tracheal tenderness present. No tracheal deviation present. No thyromegaly present.  Cardiovascular: Normal rate, regular rhythm, S1 normal, S2 normal and normal heart sounds.   No murmur heard. Pulmonary/Chest: Breath sounds normal. No stridor. No respiratory distress. She has no wheezes. She has no rales.  Musculoskeletal: She exhibits no edema.  Lymphadenopathy:       Head (right side): No tonsillar adenopathy present.       Head (left side): No tonsillar adenopathy present.    She has no cervical adenopathy.  Neurological: She is alert. Gait normal.  Skin: No rash noted. She is not diaphoretic. No erythema. Nails show no clubbing.  Psychiatric: Mood and affect normal.    Diagnostics: Results of blood tests obtained 12/31/2016 identified a white blood cell count of 8.7 with an absolute eosinophil count of 300, absolute lymphocyte  count of 2100, hemoglobin 14.3, platelet 256, IgE 29 international units/mL   Spirometry was performed and demonstrated an FEV1 of 3.14 at 95 % of predicted.  The patient had an Asthma Control Test with the following results: ACT Total Score: 12.    Assessment and Plan:   1. Not well controlled severe persistent asthma   2. Other allergic rhinitis   3. Gastroesophageal reflux disease, esophagitis presence not specified     1. Continue Advair 500 one inhalation two times per day    2. Continue montelukast 52m one tablet one time per day  3. Continue Nexium 40 mg one tablet twice a day  4. Continue Nasonex 1-2 sprays each nostril 3-7 times per day  5. Continue ProAir HFA 2 inhalations every 4-6 hours of needed.  6. Continue antihistamine if needed.  7. Taper off ALL caffeine  8. Return in 12 weeks or earlier if problem.   9. Obtain fall flu vaccine   I will see Linda Becker back in this clinic in 12 weeks. 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 Of course, this biological agent will do nothing for her reflux and I once again encouraged her to completely eliminate all caffeine consumption from her diet while she continues to use a proton pump inhibitor twice a day to address this issue.  Allena Katz, MD Allergy / Immunology [place]

## 2017-01-21 NOTE — Patient Instructions (Signed)
  1. Continue Advair 500 one inhalation two times per day    2. Continue montelukast 12m one tablet one time per day  3. Continue Nexium 40 mg one tablet twice a day  4. Continue Nasonex 1-2 sprays each nostril 3-7 times per day  5. Continue ProAir HFA 2 inhalations every 4-6 hours of needed.  6. Continue antihistamine if needed.  7. Taper off ALL caffeine  8. Return in 12 weeks or earlier if problem.   9. Obtain fall flu vaccine

## 2017-01-22 DIAGNOSIS — G43719 Chronic migraine without aura, intractable, without status migrainosus: Secondary | ICD-10-CM | POA: Diagnosis not present

## 2017-01-22 DIAGNOSIS — Z7689 Persons encountering health services in other specified circumstances: Secondary | ICD-10-CM | POA: Diagnosis not present

## 2017-01-23 LAB — BASIC METABOLIC PANEL
BUN: 13 (ref 4–21)
Creatinine: 0.9 (ref <=1.1)
Glucose: 108
Potassium: 3.9 (ref 3.4–5.3)
SODIUM: 144 (ref 137–147)

## 2017-01-23 LAB — VITAMIN D 25 HYDROXY (VIT D DEFICIENCY, FRACTURES): Vit D, 25-Hydroxy: 25.8

## 2017-01-27 ENCOUNTER — Other Ambulatory Visit: Payer: Self-pay | Admitting: Internal Medicine

## 2017-01-28 ENCOUNTER — Other Ambulatory Visit: Payer: Self-pay | Admitting: Internal Medicine

## 2017-01-28 MED FILL — DICLOFENAC POT 50 MG TABLET: 50 | 3 days supply | Qty: 6 | Fill #0

## 2017-01-29 MED ORDER — PROMETHAZINE HCL 25 MG PO TABS: 25.0000 mg | ORAL_TABLET | Freq: Four times a day (QID) | ORAL | 0 refills | Status: DC | PRN

## 2017-01-29 MED FILL — PROMETHAZINE 25 MG TABLET: 25 | 8 days supply | Qty: 30 | Fill #0

## 2017-01-29 NOTE — Telephone Encounter (Signed)
Call in #[phone] with no rf  

## 2017-02-05 DIAGNOSIS — E559 Vitamin D deficiency, unspecified: Secondary | ICD-10-CM | POA: Diagnosis not present

## 2017-02-05 DIAGNOSIS — F3181 Bipolar II disorder: Secondary | ICD-10-CM | POA: Diagnosis not present

## 2017-02-05 DIAGNOSIS — Z79899 Other long term (current) drug therapy: Secondary | ICD-10-CM | POA: Diagnosis not present

## 2017-02-05 MED FILL — LITHIUM CARBONATE ER 300 MG: 300 | 30 days supply | Qty: 90 | Fill #0

## 2017-02-05 MED FILL — lamoTRIgine 100 MG TABS: 100 | 30 days supply | Qty: 30 | Fill #1

## 2017-02-06 LAB — BASIC METABOLIC PANEL
BUN: 9 (ref 4–21)
CREATININE: 0.8 (ref <=1.1)
Glucose: 95
Potassium: 4.5 (ref 3.4–5.3)
SODIUM: 138 (ref 137–147)

## 2017-02-06 LAB — TSH: TSH: 5.97 — AB (ref <=5.90)

## 2017-02-10 ENCOUNTER — Other Ambulatory Visit: Payer: Self-pay | Admitting: Allergy and Immunology

## 2017-02-10 DIAGNOSIS — R51 Headache: Secondary | ICD-10-CM | POA: Diagnosis not present

## 2017-02-10 DIAGNOSIS — M791 Myalgia: Secondary | ICD-10-CM | POA: Diagnosis not present

## 2017-02-10 DIAGNOSIS — F429 Obsessive-compulsive disorder, unspecified: Secondary | ICD-10-CM | POA: Diagnosis not present

## 2017-02-10 DIAGNOSIS — G43119 Migraine with aura, intractable, without status migrainosus: Secondary | ICD-10-CM | POA: Diagnosis not present

## 2017-02-10 DIAGNOSIS — G43839 Menstrual migraine, intractable, without status migrainosus: Secondary | ICD-10-CM | POA: Diagnosis not present

## 2017-02-10 DIAGNOSIS — F3181 Bipolar II disorder: Secondary | ICD-10-CM | POA: Diagnosis not present

## 2017-02-10 DIAGNOSIS — G43719 Chronic migraine without aura, intractable, without status migrainosus: Secondary | ICD-10-CM | POA: Diagnosis not present

## 2017-02-10 DIAGNOSIS — M542 Cervicalgia: Secondary | ICD-10-CM | POA: Diagnosis not present

## 2017-02-10 MED FILL — VIT D3-50 50,000 UNITS CAPS: 1.25 MG | 35 days supply | Qty: 5 | Fill #1

## 2017-02-10 MED FILL — IMIPRAMINE HCL 25 MG TABLET: 25 | 30 days supply | Qty: 60 | Fill #0

## 2017-02-10 MED FILL — ESOMEPRAZOLE MAG DR 40 MG C: 40 | 30 days supply | Qty: 60 | Fill #0

## 2017-02-10 MED FILL — FASENRA 30 MG/ML SOSY: 30 | 28 days supply | Qty: 1 | Fill #1

## 2017-02-12 ENCOUNTER — Ambulatory Visit (INDEPENDENT_AMBULATORY_CARE_PROVIDER_SITE_OTHER): Payer: 59 | Admitting: *Deleted

## 2017-02-12 DIAGNOSIS — J4550 Severe persistent asthma, uncomplicated: Secondary | ICD-10-CM

## 2017-02-13 ENCOUNTER — Ambulatory Visit: Payer: Self-pay

## 2017-02-20 ENCOUNTER — Other Ambulatory Visit: Payer: Self-pay | Admitting: Allergy and Immunology

## 2017-02-20 ENCOUNTER — Telehealth: Payer: Self-pay | Admitting: Internal Medicine

## 2017-02-20 MED FILL — ADVAIR 500/50 DISKUS: 500-50 | 30 days supply | Qty: 60 | Fill #0

## 2017-02-20 MED FILL — VENTOLIN HFA 90 MCG INHALER: 108 (90 BAS | 30 days supply | Qty: 18 | Fill #0

## 2017-02-20 NOTE — Telephone Encounter (Signed)
Spoke with patient and she is going to fax over the lab report to the office

## 2017-02-20 NOTE — Telephone Encounter (Signed)
Pt is calling stating that her TSH medication is not working for her and would like to see if she could get something different due to the fact that her TSH levels have not gone done.  Pt state that she high TSH this Dx was done by her psychiatry.

## 2017-02-23 NOTE — Telephone Encounter (Signed)
Left a Vm for patient to give the office a call back.

## 2017-02-23 NOTE — Telephone Encounter (Signed)
Please bring theses result to my attention when they arrive so I can answer message  . May need  Ov depending on her needs.

## 2017-02-26 ENCOUNTER — Encounter: Payer: Self-pay | Admitting: Internal Medicine

## 2017-03-02 DIAGNOSIS — F3181 Bipolar II disorder: Secondary | ICD-10-CM | POA: Diagnosis not present

## 2017-03-02 DIAGNOSIS — F429 Obsessive-compulsive disorder, unspecified: Secondary | ICD-10-CM | POA: Diagnosis not present

## 2017-03-03 ENCOUNTER — Encounter: Payer: Self-pay | Admitting: Internal Medicine

## 2017-03-05 DIAGNOSIS — F3181 Bipolar II disorder: Secondary | ICD-10-CM | POA: Diagnosis not present

## 2017-03-05 DIAGNOSIS — F429 Obsessive-compulsive disorder, unspecified: Secondary | ICD-10-CM | POA: Diagnosis not present

## 2017-03-05 MED FILL — LIOTHYRONINE SODIUM 25 MCG: 25 | 30 days supply | Qty: 45 | Fill #0

## 2017-03-05 MED FILL — lamoTRIgine 100 MG TABS: 100 | 30 days supply | Qty: 30 | Fill #0

## 2017-03-05 MED FILL — VIT D3-50 50,000 UNITS CAPS: 1.25 MG | 35 days supply | Qty: 5 | Fill #0

## 2017-03-05 MED FILL — ONDANSETRON HCL 4 MG TABLET: 4 | 10 days supply | Qty: 30 | Fill #0

## 2017-03-05 MED FILL — LITHIUM CARBONATE ER 300 MG: 300 | 30 days supply | Qty: 90 | Fill #0

## 2017-03-05 MED FILL — TOPIRAMATE 25 MG TAB: 25 | 30 days supply | Qty: 120 | Fill #0

## 2017-03-06 ENCOUNTER — Encounter: Payer: Self-pay | Admitting: Internal Medicine

## 2017-03-11 ENCOUNTER — Ambulatory Visit: Payer: Self-pay

## 2017-03-11 MED FILL — FASENRA 30 MG/ML SOSY: 30 | 28 days supply | Qty: 1 | Fill #2

## 2017-03-11 MED FILL — ESOMEPRAZOLE MAG DR 40 MG C: 40 | 30 days supply | Qty: 60 | Fill #1

## 2017-03-12 ENCOUNTER — Ambulatory Visit: Payer: 59

## 2017-03-17 ENCOUNTER — Ambulatory Visit (INDEPENDENT_AMBULATORY_CARE_PROVIDER_SITE_OTHER): Payer: 59 | Admitting: *Deleted

## 2017-03-17 DIAGNOSIS — J4550 Severe persistent asthma, uncomplicated: Secondary | ICD-10-CM

## 2017-03-18 DIAGNOSIS — F3181 Bipolar II disorder: Secondary | ICD-10-CM | POA: Diagnosis not present

## 2017-03-18 DIAGNOSIS — F429 Obsessive-compulsive disorder, unspecified: Secondary | ICD-10-CM | POA: Diagnosis not present

## 2017-03-23 ENCOUNTER — Other Ambulatory Visit: Payer: Self-pay | Admitting: Allergy and Immunology

## 2017-03-23 MED FILL — ADVAIR 500/50 DISKUS: 500-50 | 30 days supply | Qty: 60 | Fill #1

## 2017-03-23 MED FILL — VENTOLIN HFA 90 MCG INHALER: 108 (90 BAS | 25 days supply | Qty: 18 | Fill #0

## 2017-04-05 DIAGNOSIS — J209 Acute bronchitis, unspecified: Secondary | ICD-10-CM | POA: Diagnosis not present

## 2017-04-07 MED FILL — LIOTHYRONINE SODIUM 25 MCG: 25 | 30 days supply | Qty: 45 | Fill #1

## 2017-04-07 MED FILL — lamoTRIgine 100 MG TABS: 100 | 30 days supply | Qty: 30 | Fill #1

## 2017-04-07 MED FILL — LITHIUM CARBONATE ER 300 MG: 300 | 30 days supply | Qty: 90 | Fill #1

## 2017-04-07 MED FILL — ESOMEPRAZOLE MAG DR 40 MG C: 40 | 30 days supply | Qty: 60 | Fill #2

## 2017-04-07 MED FILL — TOPIRAMATE 50 MG TABLET: 50 | 30 days supply | Qty: 60 | Fill #0

## 2017-04-15 ENCOUNTER — Ambulatory Visit (INDEPENDENT_AMBULATORY_CARE_PROVIDER_SITE_OTHER): Payer: 59 | Admitting: Allergy and Immunology

## 2017-04-15 VITALS — BP 118/76 | HR 92 | Resp 16

## 2017-04-15 DIAGNOSIS — H1010 Acute atopic conjunctivitis, unspecified eye: Secondary | ICD-10-CM

## 2017-04-15 DIAGNOSIS — J4550 Severe persistent asthma, uncomplicated: Secondary | ICD-10-CM

## 2017-04-15 DIAGNOSIS — K219 Gastro-esophageal reflux disease without esophagitis: Secondary | ICD-10-CM

## 2017-04-15 DIAGNOSIS — J3089 Other allergic rhinitis: Secondary | ICD-10-CM | POA: Diagnosis not present

## 2017-04-15 DIAGNOSIS — F429 Obsessive-compulsive disorder, unspecified: Secondary | ICD-10-CM | POA: Diagnosis not present

## 2017-04-15 DIAGNOSIS — F3181 Bipolar II disorder: Secondary | ICD-10-CM | POA: Diagnosis not present

## 2017-04-15 MED ORDER — MONTELUKAST SODIUM 10 MG PO TABS: 10.0000 mg | ORAL_TABLET | Freq: Every day | ORAL | 1 refills | Status: DC

## 2017-04-15 MED ORDER — BECLOMETHASONE DIPROPIONATE 80 MCG/ACT NA AERS: INHALATION_SPRAY | NASAL | 5 refills | Status: DC

## 2017-04-15 MED ORDER — ALBUTEROL SULFATE HFA 108 (90 BASE) MCG/ACT IN AERS: INHALATION_SPRAY | RESPIRATORY_TRACT | 1 refills | Status: DC

## 2017-04-15 MED ORDER — ALBUTEROL SULFATE (2.5 MG/3ML) 0.083% IN NEBU: INHALATION_SOLUTION | RESPIRATORY_TRACT | 1 refills | Status: DC

## 2017-04-15 MED FILL — MONTELUKAST SOD 10 MG TAB: 10 | 90 days supply | Qty: 90 | Fill #0

## 2017-04-15 MED FILL — ALBUTEROL 0.083% INHAL SOLN: (2.5 MG/3ML | 8 days supply | Qty: 150 | Fill #0

## 2017-04-15 MED FILL — VENTOLIN HFA 90 MCG INHALER: 108 (90 BAS | 17 days supply | Qty: 18 | Fill #0

## 2017-04-15 NOTE — Patient Instructions (Addendum)
  1. Continue Advair 500 one inhalation two times per day    2. Continue montelukast 48m one tablet one time per day  3. Continue Nexium 40 mg one tablet twice a day  4. Start Qnasl 80 - 1-2 puffs each nostril 3-7 times per week (download coupon)  5. Continue ProAir HFA 2 inhalations every 4-6 hours of needed.  6. Continue antihistamine (Zyrtec) and nasal saline if needed.  7. Continue to Taper off ALL caffeine  8. Return in 12 weeks or earlier if problem.

## 2017-04-15 NOTE — Progress Notes (Signed)
Follow-up Note  Referring Provider: Burnis Medin, MD Primary Provider: Burnis Medin, MD Date of Office Visit: 04/15/2017  Subjective:   Linda Becker (DOB: 1990-11-22) is a 26 y.o. female who returns to the [facility] on 04/15/2017 in re-evaluation of the following:  HPI: [name] returns to this clinic in reevaluation of her asthma and allergic rhinoconjunctivitis treated with anti-IL5 biological agent. Her last visit to this clinic was 12/31/2016.  She has had 3 injections of Benralizumab and also continues to use anti-inflammatory agents for her respiratory tract in the form of Advair and montelukast and for the most part has had pretty good control of her asthma defined as a requirement for a bronchodilator about 3 times a week. She has not required a systemic steroid to treat an exacerbation. She does have issues with nasal congestion and sneezing and some itchy eyes. She has been intolerant of using nasal steroids in the past because of irritation of her nose and bleeding.  Although she was doing okay last week she ended up in the urgent care center and was treated with a steroid shot and a prednisone Dosepak and antibiotics for a nasal and chest respiratory event defined as nasal congestion and sneezing and clear rhinorrhea and coughing with transient anosmia but no fever. Most of this has resolved. Apparently she had a chest x-ray which was normal with that event. This is her first flareup since I have seen her in the clinic.  In addition, she took an oral contraceptive agent and on the fifth day of use she developed diffuse urticaria in October. Her urticaria lasted about one week and had no associated systemic or constitutional symptoms and her skin lesions resolved without scar or hyperpigmentation.  Her reflux is under good control as long she continues to use Nexium twice a day on a consistent basis. She has made a very good effort at tapering off  all caffeine but has intermittent caffeine administration through the week.  She did receive the flu vaccine.  Allergies as of 04/15/2017   No Known Allergies     Medication List      ADVAIR DISKUS 500-50 MCG/DOSE Aepb Generic drug:  Fluticasone-Salmeterol INHALE ONE PUFF TWICE DAILY TO PREVENT COUGH OR WHEEZE. RINSE MOUTH AFTER USE.   albuterol (2.5 MG/3ML) 0.083% nebulizer solution Commonly known as:  PROVENTIL Inhale the contents of one vial in nebulizer every four to six hours as needed for cough or wheeze.   VENTOLIN HFA 108 (90 Base) MCG/ACT inhaler Generic drug:  albuterol INHALE 2 PUFFS BY MOUTH EVERY 6 HOURS AS NEEDED FOR WHEEZING OR SHORTNESS OF BREATH.   ALL DAY ALLERGY 10 MG tablet Generic drug:  cetirizine TAKE 1 TABLET (10 MG TOTAL) BY MOUTH DAILY.   baclofen 10 MG tablet Commonly known as:  LIORESAL TAKE ONE TABLET BY MOUTH TWICE DAILY LIMIT 2 DAYS PER WEEK   Benralizumab 30 MG/ML Sosy Commonly known as:  FASENRA Inject 30 mg into the skin every 28 (twenty-eight) days. For 3 doses. Then 35m every 8 weeks   esomeprazole 40 MG capsule Commonly known as:  NEXIUM Take 1 capsule (40 mg total) by mouth daily.   ibuprofen 200 MG tablet Commonly known as:  ADVIL,MOTRIN Take 600-800 mg by mouth every 6 (six) hours as needed. For pain   lamoTRIgine 100 MG tablet Commonly known as:  LAMICTAL   liothyronine 25 MCG tablet Commonly known as:  CYTOMEL TAKE 1 TABLET BY MOUTH  EVERY OTHER DAY AND 2 TABLETS EVERY OTHER DAY AS DIRECTED  BEST TO TAKE ON AN EMPTY STOMACH   lithium 600 MG capsule Take 600 mg by mouth 2 (two) times daily with a meal.   mometasone 50 MCG/ACT nasal spray Commonly known as:  NASONEX Use one to two sprays in each nostril once daily.   montelukast 10 MG tablet Commonly known as:  SINGULAIR TAKE 1 TABLET (10 MG TOTAL) BY MOUTH DAILY.   OMEGA 3 PO Take by mouth 3 (three) times daily.   PATADAY 0.2 % Soln Generic drug:  Olopatadine  HCl Apply 1 drop to eye 2 (two) times daily.   PRE-NATAL PO Take by mouth daily.   PROBIOTIC PO Take by mouth daily.   promethazine 25 MG tablet Commonly known as:  PHENERGAN Take 1 tablet (25 mg total) by mouth every 6 (six) hours as needed for nausea or vomiting (headache).   topiramate 50 MG tablet Commonly known as:  TOPAMAX TAKE 1 TABLET BY MOUTH IN THE AM AND 2 TABLETS IN THE PM AS DIRECTED   Vitamin D (Ergocalciferol) 50000 units Caps capsule Commonly known as:  DRISDOL 2 (two) times a week.       Past Medical History:  Diagnosis Date  . Allergic rhinitis    remote hx of allergy shots  . Asthma    since childhood  on controller meds extrinsic dr Carmelina Peal  . GERD (gastroesophageal reflux disease)    on nexium  for  long term sx since childhood  . H/O miscarriage, not currently pregnant    [redacted] weeks  march 2016  . Migraine   . Murmur    pt reports MVP  . Syncope    under eval ? cause  . Tachycardia    episodes with near syncope eval dr Nadyne Coombes  on no meds dced LABA    Past Surgical History:  Procedure Laterality Date  . broken right femur  2008   rod placed  . TONSILLECTOMY  2006  . WISDOM TOOTH EXTRACTION      Review of systems negative except as noted in HPI / PMHx or noted below:  Review of Systems  Constitutional: Negative.   HENT: Negative.   Eyes: Negative.   Respiratory: Negative.   Cardiovascular: Negative.   Gastrointestinal: Negative.   Genitourinary: Negative.   Musculoskeletal: Negative.   Skin: Negative.   Neurological: Negative.   Endo/Heme/Allergies: Negative.   Psychiatric/Behavioral: Negative.      Objective:   Vitals:   04/15/17 1008  BP: 118/76  Pulse: 92  Resp: 16  SpO2: 98%          Physical Exam  Constitutional: She is well-developed, well-nourished, and in no distress.  HENT:  Head: Normocephalic.  Right Ear: Tympanic membrane, external ear and ear canal normal.  Left Ear: Tympanic membrane, external ear and  ear canal normal.  Nose: Nose normal. No mucosal edema or rhinorrhea.  Mouth/Throat: Uvula is midline, oropharynx is clear and moist and mucous membranes are normal. No oropharyngeal exudate.  Eyes: Conjunctivae are normal.  Neck: Trachea normal. No tracheal tenderness present. No tracheal deviation present. No thyromegaly present.  Cardiovascular: Normal rate, regular rhythm, S1 normal, S2 normal and normal heart sounds.   No murmur heard. Pulmonary/Chest: Breath sounds normal. No stridor. No respiratory distress. She has no wheezes. She has no rales.  Musculoskeletal: She exhibits no edema.  Lymphadenopathy:       Head (right side): No tonsillar adenopathy present.  Head (left side): No tonsillar adenopathy present.    She has no cervical adenopathy.  Neurological: She is alert. Gait normal.  Skin: No rash noted. She is not diaphoretic. No erythema. Nails show no clubbing.  Psychiatric: Mood and affect normal.    Diagnostics:    Spirometry was performed and demonstrated an FEV1 of 3.58 at 103 % of predicted.  Assessment and Plan:   1. Asthma, severe persistent, well-controlled   2. Other allergic rhinitis   3. Seasonal allergic conjunctivitis   4. Gastroesophageal reflux disease, esophagitis presence not specified     1. Continue Advair 500 one inhalation two times per day    2. Continue montelukast 38m one tablet one time per day  3. Continue Nexium 40 mg one tablet twice a day  4. Start Qnasl 80 - 1-2 puffs each nostril 3-7 times per week (download coupon)  5. Continue ProAir HFA 2 inhalations every 4-6 hours of needed.  6. Continue antihistamine (Zyrtec) and nasal saline if needed.  7. Continue to Taper off ALL caffeine  8. Return in 12 weeks or earlier if problem.   [place] until her most recent event which was probably secondary to a viral respiratory tract infection. 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 She seems to be doing okay at this point and I'm not going to give her any additional therapy for that event as it appears to be 90% improved. She still has chronic issues with her nose and I will try her on a different form of nasal steroid using Qnasl as noted above. She will continue to use all her other medical therapy directed against respiratory tract inflammation and reflux-induced respiratory disease as noted above. I will see her back in this clinic in 12 weeks or earlier if there is a problem.  EAllena Katz MD [facility]

## 2017-04-16 ENCOUNTER — Telehealth: Payer: Self-pay

## 2017-04-16 ENCOUNTER — Encounter: Payer: Self-pay | Admitting: Allergy and Immunology

## 2017-04-16 DIAGNOSIS — F3181 Bipolar II disorder: Secondary | ICD-10-CM | POA: Diagnosis not present

## 2017-04-16 DIAGNOSIS — F408 Other phobic anxiety disorders: Secondary | ICD-10-CM | POA: Diagnosis not present

## 2017-04-16 NOTE — Telephone Encounter (Signed)
Patient;s insurance will not cover Qnasl 80 without trial of flunisolide OR fluticasone. Please advise on alternative.   Thank you.

## 2017-04-16 NOTE — Telephone Encounter (Signed)
Complete PA

## 2017-04-16 NOTE — Telephone Encounter (Signed)
Has failed ALL other nasal steroids in the past secondary to epistaxis.

## 2017-04-20 MED FILL — QNASL 80 MCG NASAL SPRAY: 80 | 30 days supply | Qty: 9 | Fill #0

## 2017-04-24 MED FILL — ADVAIR 500/50 DISKUS: 500-50 | 30 days supply | Qty: 60 | Fill #2

## 2017-05-01 MED FILL — LITHIUM CARBONATE ER 300 MG: 300 | 23 days supply | Qty: 90 | Fill #0

## 2017-05-05 DIAGNOSIS — F408 Other phobic anxiety disorders: Secondary | ICD-10-CM | POA: Diagnosis not present

## 2017-05-05 DIAGNOSIS — F3181 Bipolar II disorder: Secondary | ICD-10-CM | POA: Diagnosis not present

## 2017-05-12 ENCOUNTER — Ambulatory Visit: Payer: 59

## 2017-05-13 MED FILL — FASENRA 30 MG/ML SOSY: 30 | 28 days supply | Qty: 1 | Fill #3

## 2017-05-13 MED FILL — lamoTRIgine 100 MG TABS: 100 | 30 days supply | Qty: 30 | Fill #0

## 2017-05-13 MED FILL — TOPIRAMATE 50 MG TABLET: 50 | 30 days supply | Qty: 60 | Fill #0

## 2017-05-13 MED FILL — ESOMEPRAZOLE MAG DR 40 MG C: 40 | 30 days supply | Qty: 60 | Fill #3

## 2017-05-13 MED FILL — LIOTHYRONINE SODIUM 25 MCG: 25 | 30 days supply | Qty: 45 | Fill #0

## 2017-05-15 ENCOUNTER — Ambulatory Visit (INDEPENDENT_AMBULATORY_CARE_PROVIDER_SITE_OTHER): Payer: 59 | Admitting: *Deleted

## 2017-05-15 DIAGNOSIS — J4550 Severe persistent asthma, uncomplicated: Secondary | ICD-10-CM

## 2017-05-18 MED ORDER — BENRALIZUMAB 30 MG/ML ~~LOC~~ SOSY
30.0000 mg | PREFILLED_SYRINGE | SUBCUTANEOUS | Status: DC
  Administered 2017-05-15: 30 mg via SUBCUTANEOUS

## 2017-05-19 ENCOUNTER — Encounter: Payer: Self-pay | Admitting: Endocrinology

## 2017-05-19 ENCOUNTER — Ambulatory Visit: Payer: 59 | Admitting: Endocrinology

## 2017-05-19 VITALS — BP 118/76 | HR 95 | Ht 66.0 in | Wt 304.4 lb

## 2017-05-19 DIAGNOSIS — E039 Hypothyroidism, unspecified: Secondary | ICD-10-CM | POA: Diagnosis not present

## 2017-05-19 NOTE — Progress Notes (Signed)
Patient ID: Linda Becker, female   DOB: 1990/10/07, 26 y.o.   MRN: [id number]             Reason for Appointment:  Hypothyroidism, new visit    History of Present Illness:   Referring Physician: Dr. Barbie Banner  Hypothyroidism was first diagnosed in 10/2016  At the time of diagnosis patient was having symptoms of  fatigue, cold sensitivity, weight gain, brittle nails and hair loss .          However her symptoms had been present for several years and not any worse She was tested with her TSH because of needing to start lithium therapy Baseline TSH was 6.7, no baseline free T4 or free T3 available  The patient has been treated with  liothyronine, initially 25 g daily and then 50 g by her psychiatrist for management of her high TSH The patient did not subjectively feeling any better with taking the liothyronine Also when she was tried on the 50 g daily she was having palpitations, shortness of breath and feeling excessively warm and sweaty She has been continuing to follow-up with her psychiatrist for management  With starting thyroid supplementation the patient's symptoms did not improve  She had a follow-up TSH done in August which was still about the same at 6.0  Because of her continued abnormal thyroid levels she has been referred here for further management         Patient's weight history is as follows:  Wt Readings from Last 3 Encounters:  05/19/17 (!) 304 lb 6.4 oz (138.1 kg)  12/31/16 281 lb 3.2 oz (127.6 kg)  10/08/16 278 lb 9.6 oz (126.4 kg)    Thyroid function results have been as follows:  Lab Results  Component Value Date   TSH 5.97 (A) 02/06/2017   TSH 2.16 09/05/2015   FREET4 0.93 09/05/2015   02/05/17: Free thyroxine index low at 1.0   Past Medical History:  Diagnosis Date  . Allergic rhinitis    remote hx of allergy shots  . Asthma    since childhood  on controller meds extrinsic dr Carmelina Peal  . GERD (gastroesophageal reflux disease)    on  nexium  for  long term sx since childhood  . H/O miscarriage, not currently pregnant    [redacted] weeks  march 2016  . Migraine   . Murmur    pt reports MVP  . Syncope    under eval ? cause  . Tachycardia    episodes with near syncope eval dr Nadyne Coombes  on no meds dced LABA    Past Surgical History:  Procedure Laterality Date  . broken right femur  2008   rod placed  . TONSILLECTOMY  2006  . WISDOM TOOTH EXTRACTION      Family History  Problem Relation Age of Onset  . Asthma Father   . Arthritis Father   . Hyperlipidemia Father   . Heart disease Father   . Hypertension Father   . Diabetes Father   . Arthritis Mother   . Hyperlipidemia Mother   . Heart disease Mother   . Hypertension Mother   . Diabetes Mother   . Arthritis Maternal Grandmother   . Breast cancer Maternal Grandmother   . Colon cancer Paternal Grandfather   . Diabetes Paternal Grandfather   . Breast cancer Paternal Grandmother   . Diabetes Paternal Grandmother   . Breast cancer Paternal Aunt   . Breast cancer Paternal Aunt   . Diabetes Maternal Grandfather  Social History:  reports that  has never smoked. she has never used smokeless tobacco. She reports that she drinks alcohol. She reports that she does not use drugs.  Allergies:  Allergies  Allergen Reactions  . Progesterone Rash    Was in a form of birth control.    Allergies as of 05/19/2017      Reactions   Progesterone Rash   Was in a form of birth control.      Medication List        Accurate as of 05/19/17  2:52 PM. Always use your most recent med list.          ADVAIR DISKUS 500-50 MCG/DOSE Aepb Generic drug:  Fluticasone-Salmeterol INHALE ONE PUFF TWICE DAILY TO PREVENT COUGH OR WHEEZE. RINSE MOUTH AFTER USE.   albuterol 108 (90 Base) MCG/ACT inhaler Commonly known as:  VENTOLIN HFA Inhale two puffs every four to six hours as needed for cough or wheeze.   albuterol (2.5 MG/3ML) 0.083% nebulizer solution Commonly known as:   PROVENTIL Inhale the contents of one vial in nebulizer every four to six hours as needed for cough or wheeze.   ALL DAY ALLERGY 10 MG tablet Generic drug:  cetirizine TAKE 1 TABLET (10 MG TOTAL) BY MOUTH DAILY.   baclofen 10 MG tablet Commonly known as:  LIORESAL TAKE ONE TABLET BY MOUTH TWICE DAILY LIMIT 2 DAYS PER WEEK   Beclomethasone Dipropionate 80 MCG/ACT Aers Commonly known as:  QNASL Use one to two puffs in each nostril once daily as directed.   Benralizumab 30 MG/ML Sosy Commonly known as:  FASENRA Inject 30 mg into the skin every 28 (twenty-eight) days. For 3 doses. Then 92m every 8 weeks   esomeprazole 40 MG capsule Commonly known as:  NEXIUM Take 1 capsule (40 mg total) by mouth daily.   ibuprofen 200 MG tablet Commonly known as:  ADVIL,MOTRIN Take 600-800 mg by mouth every 6 (six) hours as needed. For pain   lamoTRIgine 100 MG tablet Commonly known as:  LAMICTAL   liothyronine 25 MCG tablet Commonly known as:  CYTOMEL TAKE 1 TABLET BY MOUTH EVERY OTHER DAY AND 2 TABLETS EVERY OTHER DAY AS DIRECTED  BEST TO TAKE ON AN EMPTY STOMACH   lithium 600 MG capsule Take 600 mg by mouth 2 (two) times daily with a meal.   mometasone 50 MCG/ACT nasal spray Commonly known as:  NASONEX Use one to two sprays in each nostril once daily.   montelukast 10 MG tablet Commonly known as:  SINGULAIR Take 1 tablet (10 mg total) by mouth daily.   OMEGA 3 PO Take by mouth 3 (three) times daily.   PATADAY 0.2 % Soln Generic drug:  Olopatadine HCl Apply 1 drop to eye 2 (two) times daily.   PRE-NATAL PO Take by mouth daily.   PROBIOTIC PO Take by mouth daily.   promethazine 25 MG tablet Commonly known as:  PHENERGAN Take 1 tablet (25 mg total) by mouth every 6 (six) hours as needed for nausea or vomiting (headache).   topiramate 50 MG tablet Commonly known as:  TOPAMAX TAKE 1 TABLET BY MOUTH IN THE AM AND 2 TABLETS IN THE PM AS DIRECTED   Vitamin D (Ergocalciferol)  50000 units Caps capsule Commonly known as:  DRISDOL 2 (two) times a week.          Review of Systems  Constitutional: Positive for weight gain.       She thinks she has gained about 100  pounds in the last year without changes in her diet or activity levels  HENT: Negative for trouble swallowing.   Respiratory:       She has had asthma treated by allergist  Cardiovascular: Positive for palpitations.       She has a history of on and off self-limited palpitations with rapid heartbeat, or cardiologist thinks this is nonspecific tachycardia  Gastrointestinal: Negative for nausea and abdominal pain.  Endocrine: Positive for heat intolerance. Negative for menstrual changes.  Musculoskeletal: Negative for joint pain.  Skin: Negative for dry skin.       She has had mild chronic hair loss, not worse recently  Neurological: Positive for tremors.  Psychiatric/Behavioral: Negative for insomnia.       She says her moods are fairly good, has benefited from lithium therapy since 5/18 for her bipolar disorder           Examination:    BP 118/76   Pulse 95   Ht 5' 6"  (1.676 m)   Wt (!) 304 lb 6.4 oz (138.1 kg)   LMP 05/05/2017   SpO2 99%   BMI 49.13 kg/m   GENERAL:  She has marked generalized obesity, large build.  No central obesity around her neck  No pallor, clubbing, lymphadenopathy or edema.    Skin:  no rash or pigmentation.  She has pinkish abdominal striae , not on extremities No thinning of the skin  EYES:  No prominence of the eyes or swelling of the eyelids  ENT: Oral mucosa and tongue normal.  THYROID:  Not palpable.  HEART:  Normal  S1 and S2; no murmur or click.  CHEST:    Lungs: Vescicular breath sounds heard equally.  No crepitations/ wheeze.  ABDOMEN:  No distention.  Liver and spleen not palpable.  No other mass or tenderness.  NEUROLOGICAL: Reflexes are bilaterally slightly brisk at the ankles, 1+ at biceps  JOINTS:   Normal.   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  Unclear whether she has had any symptoms from hypothyroidism with baseline TSH of 6.7 With taking liothyronine even up to 50 g daily she has not had any improvement in her fatigue She is also having symptoms of over treatment with liothyronine especially with the higher dose in the form of palpitations, heat intolerance and sweating and mild tremor at times  OBESITY: She has morbid obesity and has had significant weight gain in the last year either from her depression or medications such as Lamictal and she will discuss this with her psychiatrist also   PLAN:  Stop liothyronine  Will need to get baseline labs again in about 6 weeks including free T4 and free T3 Check a antithyroid peroxidase antibody to establish autoimmune condition Also discussed that if she starts feeling unusual fatigue in the next couple of weeks she can come for labs earlier Discussed nature of hypothyroidism Discussed that unless her TSH is significantly higher can make a case for watching her without supplementation since she is not having any clearcut symptoms  However if her TSH is 10 or above may consider replacement therapy with levothyroxine; this would be long-term treatment  Follow-up in 6 weeks with labs   [facility] 05/19/2017, 2:52 PM   Consultation note copy sent to the PCP  Note: This office note was prepared with Dragon voice recognition system technology. Any transcriptional errors that result from this process are unintentional.

## 2017-05-27 MED FILL — ADVAIR 500/50 DISKUS: 500-50 | 30 days supply | Qty: 60 | Fill #3

## 2017-05-27 MED FILL — LITHIUM CARBONATE ER 300 MG: 300 | 23 days supply | Qty: 90 | Fill #1

## 2017-05-28 DIAGNOSIS — F3181 Bipolar II disorder: Secondary | ICD-10-CM | POA: Diagnosis not present

## 2017-05-28 DIAGNOSIS — F408 Other phobic anxiety disorders: Secondary | ICD-10-CM | POA: Diagnosis not present

## 2017-06-10 MED FILL — ESOMEPRAZOLE MAG DR 40 MG C: 40 | 30 days supply | Qty: 60 | Fill #4

## 2017-06-10 MED FILL — VENTOLIN HFA 90 MCG INHALER: 108 (90 BAS | 17 days supply | Qty: 18 | Fill #1

## 2017-06-12 ENCOUNTER — Telehealth: Payer: 59 | Admitting: Family

## 2017-06-12 DIAGNOSIS — B9689 Other specified bacterial agents as the cause of diseases classified elsewhere: Secondary | ICD-10-CM

## 2017-06-12 DIAGNOSIS — J028 Acute pharyngitis due to other specified organisms: Secondary | ICD-10-CM | POA: Diagnosis not present

## 2017-06-12 DIAGNOSIS — R300 Dysuria: Secondary | ICD-10-CM | POA: Diagnosis not present

## 2017-06-12 MED ORDER — AZITHROMYCIN 250 MG PO TABS: ORAL_TABLET | ORAL | 0 refills | Status: DC

## 2017-06-12 MED ORDER — BENZONATATE 100 MG PO CAPS: 100.0000 mg | ORAL_CAPSULE | Freq: Three times a day (TID) | ORAL | 0 refills | Status: DC | PRN

## 2017-06-12 NOTE — Progress Notes (Signed)
Thank you for the details you included in the comment boxes. Those details are very helpful in determining the best course of treatment for you and help [place] to provide the best care. This looks more like bacterial pharyngitis (maybe in face moving to chest) so we will treat with antibiotics.  We are sorry that you are not feeling well.  Here is how we plan to help!  Based on your presentation I believe you most likely have A cough due to bacteria.  When patients have a fever and a productive cough with a change in color or increased sputum production, we are concerned about bacterial bronchitis.  If left untreated it can progress to pneumonia.  If your symptoms do not improve with your treatment plan it is important that you contact your provider.   I have prescribed Azithromyin 250 mg: two tablets now and then one tablet daily for 4 additonal days    In addition you may use A non-prescription cough medication called Mucinex DM: take 2 tablets every 12 hours. and A prescription cough medication called Tessalon Perles 12m. You may take 1-2 capsules every 8 hours as needed for your cough.   From your responses in the eVisit questionnaire you describe inflammation in the upper respiratory tract which is causing a significant cough.  This is commonly called Bronchitis and has four common causes:    Allergies  Viral Infections  Acid Reflux  Bacterial Infection Allergies, viruses and acid reflux are treated by controlling symptoms or eliminating the cause. An example might be a cough caused by taking certain blood pressure medications. You stop the cough by changing the medication. Another example might be a cough caused by acid reflux. Controlling the reflux helps control the cough.  USE OF BRONCHODILATOR ("RESCUE") INHALERS: There is a risk from using your bronchodilator too frequently.  The risk is that over-reliance on a medication which only relaxes the muscles surrounding the breathing tubes  can reduce the effectiveness of medications prescribed to reduce swelling and congestion of the tubes themselves.  Although you feel brief relief from the bronchodilator inhaler, your asthma may actually be worsening with the tubes becoming more swollen and filled with mucus.  This can delay other crucial treatments, such as oral steroid medications. If you need to use a bronchodilator inhaler daily, several times per day, you should discuss this with your provider.  There are probably better treatments that could be used to keep your asthma under control.     HOME CARE . Only take medications as instructed by your medical team. . Complete the entire course of an antibiotic. . Drink plenty of fluids and get plenty of rest. . Avoid close contacts especially the very young and the elderly . Cover your mouth if you cough or cough into your sleeve. . Always remember to wash your hands . A steam or ultrasonic humidifier can help congestion.   GET HELP RIGHT AWAY IF: . You develop worsening fever. . You become short of breath . You cough up blood. . Your symptoms persist after you have completed your treatment plan MAKE SURE YOU   Understand these instructions.  Will watch your condition.  Will get help right away if you are not doing well or get worse.  Your e-visit answers were reviewed by a board certified advanced clinical practitioner to complete your personal care plan.  Depending on the condition, your plan could have included both over the counter or prescription medications. If there is a  problem please reply  once you have received a response from your provider. Your safety is important to [place].  If you have drug allergies check your prescription carefully.    You can use MyChart to ask questions about today's visit, request a non-urgent call back, or ask for a work or school excuse for 24 hours related to this e-Visit. If it has been greater than 24 hours you will need to follow up  with your provider, or enter a new e-Visit to address those concerns. You will get an e-mail in the next two days asking about your experience.  I hope that your e-visit has been valuable and will speed your recovery. Thank you for using e-visits.

## 2017-06-15 DIAGNOSIS — F408 Other phobic anxiety disorders: Secondary | ICD-10-CM | POA: Diagnosis not present

## 2017-06-15 DIAGNOSIS — F3181 Bipolar II disorder: Secondary | ICD-10-CM | POA: Diagnosis not present

## 2017-06-15 DIAGNOSIS — N390 Urinary tract infection, site not specified: Secondary | ICD-10-CM | POA: Diagnosis not present

## 2017-06-16 NOTE — L&D Delivery Note (Signed)
Delivery Note At  a non-viable female infant was delivered via  (Presentation: right occiput; anterior ).  APGAR: 0, 0; weight  .   Placenta status: spontaneous, intact.  Cord: 2V   At time of delivery, there was superficial sloughing of the skin around the umbilical cord and the genitalia. Otherwise, the infant was normal appearing without obvious dysmorphia. The placenta delivered intact without evidence of abnormality  Anesthesia: Epidural  Episiotomy:  none Lacerations:  1st degree perineal Suture Repair: 3.0 vicryl Est. Blood Loss (mL):  200 cc  Mom to postpartum.  Baby to [place].  Vanessa Kick 12/22/2017, 2:37 AM

## 2017-06-22 MED FILL — FASENRA 30 MG/ML SOSY: 30 | 28 days supply | Qty: 1 | Fill #4

## 2017-06-23 DIAGNOSIS — E039 Hypothyroidism, unspecified: Secondary | ICD-10-CM | POA: Diagnosis not present

## 2017-06-23 DIAGNOSIS — Z3480 Encounter for supervision of other normal pregnancy, unspecified trimester: Secondary | ICD-10-CM | POA: Diagnosis not present

## 2017-06-23 DIAGNOSIS — N925 Other specified irregular menstruation: Secondary | ICD-10-CM | POA: Diagnosis not present

## 2017-06-23 DIAGNOSIS — Z113 Encounter for screening for infections with a predominantly sexual mode of transmission: Secondary | ICD-10-CM | POA: Diagnosis not present

## 2017-06-23 LAB — OB RESULTS CONSOLE GC/CHLAMYDIA
CHLAMYDIA, DNA PROBE: NEGATIVE
GC PROBE AMP, GENITAL: NEGATIVE

## 2017-06-23 LAB — OB RESULTS CONSOLE HEPATITIS B SURFACE ANTIGEN: Hepatitis B Surface Ag: NEGATIVE

## 2017-06-23 LAB — OB RESULTS CONSOLE RUBELLA ANTIBODY, IGM: RUBELLA: IMMUNE

## 2017-06-23 LAB — OB RESULTS CONSOLE ANTIBODY SCREEN: ANTIBODY SCREEN: NEGATIVE

## 2017-06-23 LAB — OB RESULTS CONSOLE RPR: RPR: NONREACTIVE

## 2017-06-23 LAB — OB RESULTS CONSOLE ABO/RH: RH Type: POSITIVE

## 2017-06-23 LAB — OB RESULTS CONSOLE HIV ANTIBODY (ROUTINE TESTING): HIV: NONREACTIVE

## 2017-06-25 MED FILL — LEVOTHYROXINE 137 MCG TABLE: 137 | 30 days supply | Qty: 30 | Fill #0

## 2017-06-26 ENCOUNTER — Other Ambulatory Visit: Payer: Self-pay | Admitting: Allergy and Immunology

## 2017-06-26 MED FILL — VENTOLIN HFA 90 MCG INHALER: 108 (90 BAS | 25 days supply | Qty: 18 | Fill #1

## 2017-06-26 MED FILL — ADVAIR 500/50 DISKUS: 500-50 | 30 days supply | Qty: 60 | Fill #0

## 2017-06-29 ENCOUNTER — Other Ambulatory Visit (INDEPENDENT_AMBULATORY_CARE_PROVIDER_SITE_OTHER): Payer: 59

## 2017-06-29 DIAGNOSIS — E039 Hypothyroidism, unspecified: Secondary | ICD-10-CM | POA: Diagnosis not present

## 2017-06-29 LAB — TSH: TSH: 5.23 u[IU]/mL — AB (ref 0.35–4.50)

## 2017-06-29 LAB — T3, FREE: T3 FREE: 3.5 pg/mL (ref 2.3–4.2)

## 2017-06-29 LAB — T4, FREE: FREE T4: 0.91 ng/dL (ref 0.60–1.60)

## 2017-06-30 LAB — THYROID PEROXIDASE ANTIBODY: Thyroperoxidase Ab SerPl-aCnc: 17 IU/mL (ref 0–34)

## 2017-07-02 ENCOUNTER — Encounter: Payer: Self-pay | Admitting: Endocrinology

## 2017-07-02 ENCOUNTER — Ambulatory Visit: Payer: 59 | Admitting: Endocrinology

## 2017-07-02 VITALS — BP 102/66 | HR 84 | Temp 98.6°F | Wt 309.4 lb

## 2017-07-02 DIAGNOSIS — E039 Hypothyroidism, unspecified: Secondary | ICD-10-CM | POA: Diagnosis not present

## 2017-07-02 NOTE — Progress Notes (Signed)
Patient ID: Linda Becker, female   DOB: 1991/05/20, 27 y.o.   MRN: [id number]             Reason for Appointment:  Hypothyroidism, follow-up visit    History of Present Illness:    Hypothyroidism was first diagnosed in 10/2016  At the time of diagnosis patient was having symptoms of  fatigue, cold sensitivity, weight gain, brittle nails and hair loss .          However her symptoms had been present for several years and not any worse She was tested with her TSH because of needing to start lithium therapy Baseline TSH was 6.7, no baseline free T4 or free T3 available  Prior to consultation she had been treated with  liothyronine, initially 25 g daily and then 50 g by her psychiatrist for management of her high TSH The patient did not subjectively feeling any better with taking the liothyronine; with this she was having palpitations, shortness of breath and feeling excessively warm and sweaty  Because of her symptoms she was told to stop the liothyronine and have baseline labs done again  Currently she is not on any supplementation and is complaining of feeling tired and sleepy Also feels cold intolerance However her TSH is only 5.2 with normal free T3 and free T4 levels  She also has now has early pregnancy         Patient's weight history is as follows:  Wt Readings from Last 3 Encounters:  07/02/17 (!) 309 lb 6 oz (140.3 kg)  05/19/17 (!) 304 lb 6.4 oz (138.1 kg)  12/31/16 281 lb 3.2 oz (127.6 kg)    TPO antibody is negative  Thyroid function results have been as follows:  Lab Results  Component Value Date   TSH 5.23 (H) 06/29/2017   TSH 5.97 (A) 02/06/2017   TSH 2.16 09/05/2015   FREET4 0.91 06/29/2017   FREET4 0.93 09/05/2015   T3FREE 3.5 06/29/2017    02/05/17: Free thyroxine index low at 1.0   Past Medical History:  Diagnosis Date  . Allergic rhinitis    remote hx of allergy shots  . Asthma    since childhood  on controller meds extrinsic dr  Carmelina Peal  . GERD (gastroesophageal reflux disease)    on nexium  for  long term sx since childhood  . H/O miscarriage, not currently pregnant    [redacted] weeks  march 2016  . Migraine   . Murmur    pt reports MVP  . Syncope    under eval ? cause  . Tachycardia    episodes with near syncope eval dr Nadyne Coombes  on no meds dced LABA    Past Surgical History:  Procedure Laterality Date  . broken right femur  2008   rod placed  . TONSILLECTOMY  2006  . WISDOM TOOTH EXTRACTION      Family History  Problem Relation Age of Onset  . Asthma Father   . Arthritis Father   . Hyperlipidemia Father   . Heart disease Father   . Hypertension Father   . Diabetes Father   . Arthritis Mother   . Hyperlipidemia Mother   . Heart disease Mother   . Hypertension Mother   . Diabetes Mother   . Arthritis Maternal Grandmother   . Breast cancer Maternal Grandmother   . Thyroid disease Maternal Grandmother   . Colon cancer Paternal Grandfather   . Diabetes Paternal Grandfather   . Breast cancer Paternal Grandmother   .  Diabetes Paternal Grandmother   . Breast cancer Paternal Aunt   . Breast cancer Paternal Aunt   . Diabetes Maternal Grandfather     Social History:  reports that  has never smoked. she has never used smokeless tobacco. She reports that she drinks alcohol. She reports that she does not use drugs.  Allergies:  Allergies  Allergen Reactions  . Progesterone Rash    Was in a form of birth control.    Allergies as of 07/02/2017      Reactions   Progesterone Rash   Was in a form of birth control.      Medication List        Accurate as of 07/02/17  4:07 PM. Always use your most recent med list.          ADVAIR DISKUS 500-50 MCG/DOSE Aepb Generic drug:  Fluticasone-Salmeterol INHALE ONE PUFF BY MOUTH TWICE DAILY TO PREVENT COUGH OR WHEEZE. RINSE MOUTH AFTER USE.   albuterol 108 (90 Base) MCG/ACT inhaler Commonly known as:  VENTOLIN HFA Inhale two puffs every four to six hours as  needed for cough or wheeze.   albuterol (2.5 MG/3ML) 0.083% nebulizer solution Commonly known as:  PROVENTIL Inhale the contents of one vial in nebulizer every four to six hours as needed for cough or wheeze.   ALL DAY ALLERGY 10 MG tablet Generic drug:  cetirizine TAKE 1 TABLET (10 MG TOTAL) BY MOUTH DAILY.   baclofen 10 MG tablet Commonly known as:  LIORESAL TAKE ONE TABLET BY MOUTH TWICE DAILY LIMIT 2 DAYS PER WEEK   Beclomethasone Dipropionate 80 MCG/ACT Aers Commonly known as:  QNASL Use one to two puffs in each nostril once daily as directed.   Benralizumab 30 MG/ML Sosy Commonly known as:  FASENRA Inject 30 mg into the skin every 28 (twenty-eight) days. For 3 doses. Then 58m every 8 weeks   esomeprazole 40 MG capsule Commonly known as:  NEXIUM Take 1 capsule (40 mg total) by mouth daily.   levothyroxine 100 MCG tablet Commonly known as:  SYNTHROID, LEVOTHROID Take 100 mcg by mouth daily before breakfast.   montelukast 10 MG tablet Commonly known as:  SINGULAIR Take 1 tablet (10 mg total) by mouth daily.   PATADAY 0.2 % Soln Generic drug:  Olopatadine HCl Apply 1 drop to eye daily as needed.   PRE-NATAL PO Take by mouth daily.   PROBIOTIC PO Take by mouth daily.   promethazine 25 MG tablet Commonly known as:  PHENERGAN Take 1 tablet (25 mg total) by mouth every 6 (six) hours as needed for nausea or vomiting (headache).   Vitamin D3 1000 UNIT/SPRAY Liqd Take by mouth at bedtime.          Review of Systems  Endocrine: Positive for cold intolerance.           Examination:    BP 102/66 (BP Location: Left Arm, Patient Position: Sitting, Cuff Size: Large)   Pulse 84   Temp 98.6 F (37 C) (Oral)   Wt (!) 309 lb 6 oz (140.3 kg)   LMP 05/05/2017   SpO2 98%   BMI 49.93 kg/m      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  Although the patient did not have any subjective improvement with previous thyroid supplementation she is  complaining about excessive fatigue and exhaustion and some cold intolerance although this may be partly related to her recently diagnosed pregnancy  Her TSH is 5.2 without any treatment  PLAN:  Because of her first trimester pregnancy she will need to be on thyroid supplementation However since her TSH is minimally increased do not recommend taking 100 g of levothyroxine as recommended by her gynecologist Considering her weight she can start with 50 g daily Discussed needing to take this before breakfast without her prenatal vitamins  Follow-up in 4 weeks with labs   Elayne Snare 07/02/2017, 4:07 PM    Note: This office note was prepared with Dragon voice recognition system technology. Any transcriptional errors that result from this process are unintentional.

## 2017-07-07 DIAGNOSIS — R55 Syncope and collapse: Secondary | ICD-10-CM | POA: Diagnosis not present

## 2017-07-07 DIAGNOSIS — R002 Palpitations: Secondary | ICD-10-CM | POA: Diagnosis not present

## 2017-07-07 DIAGNOSIS — E039 Hypothyroidism, unspecified: Secondary | ICD-10-CM | POA: Diagnosis not present

## 2017-07-08 MED FILL — MONTELUKAST SOD 10 MG TAB: 10 | 90 days supply | Qty: 90 | Fill #1

## 2017-07-09 ENCOUNTER — Telehealth: Payer: Self-pay | Admitting: *Deleted

## 2017-07-09 MED FILL — ONDANSETRON HCL 4 MG TABLET: 4 | 10 days supply | Qty: 30 | Fill #1

## 2017-07-09 NOTE — Telephone Encounter (Signed)
Received message from one of the nurses last week regarding patient biologic therapy and just found out she is pregnant.  I had discussion with the medical information liasion from [place] [facility] [facility] and she advised no studies as yet and only one reported case with limited information.  Per discussion with Dr Neldon Mc he wanted me to reach out to patient and discuss that there is limited research and reports regarding pregnancy and Linda Becker and let her make choice if she wants to continue therapy

## 2017-07-10 ENCOUNTER — Ambulatory Visit: Payer: 59

## 2017-07-14 ENCOUNTER — Ambulatory Visit: Payer: 59 | Admitting: Allergy and Immunology

## 2017-07-15 DIAGNOSIS — F408 Other phobic anxiety disorders: Secondary | ICD-10-CM | POA: Diagnosis not present

## 2017-07-15 DIAGNOSIS — F3181 Bipolar II disorder: Secondary | ICD-10-CM | POA: Diagnosis not present

## 2017-07-20 ENCOUNTER — Other Ambulatory Visit: Payer: Self-pay | Admitting: Allergy and Immunology

## 2017-07-20 MED FILL — ADVAIR 500/50 DISKUS: 500-50 | 30 days supply | Qty: 60 | Fill #0

## 2017-07-20 MED FILL — VENTOLIN HFA 90 MCG INHALER: 108 (90 BAS | 25 days supply | Qty: 18 | Fill #0

## 2017-07-20 MED FILL — QNASL 80 MCG NASAL SPRAY: 80 | 30 days supply | Qty: 9 | Fill #1

## 2017-07-20 MED FILL — lamoTRIgine 25 MG TABS: 25 | 30 days supply | Qty: 54 | Fill #0

## 2017-07-20 MED FILL — ESOMEPRAZOLE MAG DR 40 MG C: 40 | 30 days supply | Qty: 60 | Fill #5

## 2017-07-21 DIAGNOSIS — R0609 Other forms of dyspnea: Secondary | ICD-10-CM | POA: Diagnosis not present

## 2017-07-21 DIAGNOSIS — R55 Syncope and collapse: Secondary | ICD-10-CM | POA: Diagnosis not present

## 2017-07-24 ENCOUNTER — Other Ambulatory Visit: Payer: Self-pay | Admitting: Family Medicine

## 2017-07-24 NOTE — Progress Notes (Deleted)
No chief complaint on file.   HPI: Patient  Linda Becker  27 y.o. comes in today for Preventive Health Care visit   Health Maintenance  Topic Date Due  . HIV Screening  11/10/2007  . TETANUS/TDAP  11/10/2011  . PAP SMEAR  08/30/2018  . INFLUENZA VACCINE  Completed   Health Maintenance Review LIFESTYLE:  Exercise:   Tobacco/ETS: Alcohol:  Sugar beverages: Sleep: Drug use: no HH of  Work:    ROS:  GEN/ HEENT: No fever, significant weight changes sweats headaches vision problems hearing changes, CV/ PULM; No chest pain shortness of breath cough, syncope,edema  change in exercise tolerance. GI /GU: No adominal pain, vomiting, change in bowel habits. No blood in the stool. No significant GU symptoms. SKIN/HEME: ,no acute skin rashes suspicious lesions or bleeding. No lymphadenopathy, nodules, masses.  NEURO/ PSYCH:  No neurologic signs such as weakness numbness. No depression anxiety. IMM/ Allergy: No unusual infections.  Allergy .   REST of 12 system review negative except as per HPI   Past Medical History:  Diagnosis Date  . Allergic rhinitis    remote hx of allergy shots  . Asthma    since childhood  on controller meds extrinsic dr Carmelina Peal  . GERD (gastroesophageal reflux disease)    on nexium  for  long term sx since childhood  . H/O miscarriage, not currently pregnant    [redacted] weeks  march 2016  . Migraine   . Murmur    pt reports MVP  . Syncope    under eval ? cause  . Tachycardia    episodes with near syncope eval dr Nadyne Coombes  on no meds dced LABA    Past Surgical History:  Procedure Laterality Date  . broken right femur  2008   rod placed  . TONSILLECTOMY  2006  . WISDOM TOOTH EXTRACTION      Family History  Problem Relation Age of Onset  . Asthma Father   . Arthritis Father   . Hyperlipidemia Father   . Heart disease Father   . Hypertension Father   . Diabetes Father   . Arthritis Mother   . Hyperlipidemia Mother   . Heart disease  Mother   . Hypertension Mother   . Diabetes Mother   . Arthritis Maternal Grandmother   . Breast cancer Maternal Grandmother   . Thyroid disease Maternal Grandmother   . Colon cancer Paternal Grandfather   . Diabetes Paternal Grandfather   . Breast cancer Paternal Grandmother   . Diabetes Paternal Grandmother   . Breast cancer Paternal Aunt   . Breast cancer Paternal Aunt   . Diabetes Maternal Grandfather     Social History   Socioeconomic History  . Marital status: Divorced    Spouse name: Not on file  . Number of children: Not on file  . Years of education: Not on file  . Highest education level: Not on file  Social Needs  . Financial resource strain: Not on file  . Food insecurity - worry: Not on file  . Food insecurity - inability: Not on file  . Transportation needs - medical: Not on file  . Transportation needs - non-medical: Not on file  Occupational History  . Not on file  Tobacco Use  . Smoking status: Never Smoker  . Smokeless tobacco: Never Used  Substance and Sexual Activity  . Alcohol use: Yes    Alcohol/week: 0.0 oz  . Drug use: No  . Sexual activity: No  Birth control/protection: None  Other Topics Concern  . 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 ADVAIR DISKUS 500-50 MCG/DOSE AEPB INHALE 1 PUFF BY MOUTH 2 TIMES DAILY TO PREVENT COUGH OR WHEEZE. RINSE MOUTH AFTER USE. 60 each 0  . albuterol (PROVENTIL) (2.5 MG/3ML) 0.083% nebulizer solution Inhale the contents of one vial in nebulizer every four to six hours as needed for cough or wheeze. 60 vial 1  . ALL DAY ALLERGY 10 MG tablet TAKE 1 TABLET (10 MG TOTAL) BY MOUTH DAILY. 30 tablet 5  . baclofen (LIORESAL) 10 MG tablet TAKE  ONE TABLET BY MOUTH TWICE DAILY LIMIT 2 DAYS PER WEEK  0  . Beclomethasone Dipropionate (QNASL) 80 MCG/ACT AERS Use one to two puffs in each nostril once daily as directed. 1 Inhaler 5  . Benralizumab (FASENRA) 30 MG/ML SOSY Inject 30 mg into the skin every 28 (twenty-eight) days. For 3 doses. Then 54m every 8 weeks 1 Syringe 8  . Cholecalciferol (VITAMIN D3) 1000 UNIT/SPRAY LIQD Take by mouth at bedtime.    .Marland Kitchenesomeprazole (NEXIUM) 40 MG capsule Take 1 capsule (40 mg total) by mouth daily. 30 capsule 3  . levothyroxine (SYNTHROID, LEVOTHROID) 100 MCG tablet Take 100 mcg by mouth daily before breakfast.    . montelukast (SINGULAIR) 10 MG tablet Take 1 tablet (10 mg total) by mouth daily. 90 tablet 1  . Olopatadine HCl (PATADAY) 0.2 % SOLN Apply 1 drop to eye daily as needed.     . Prenatal Multivit-Min-Fe-FA (PRE-NATAL PO) Take by mouth daily.    . Probiotic Product (PROBIOTIC PO) Take by mouth daily.    . promethazine (PHENERGAN) 25 MG tablet Take 1 tablet (25 mg total) by mouth every 6 (six) hours as needed for nausea or vomiting (headache). 30 tablet 0  . VENTOLIN HFA 108 (90 Base) MCG/ACT inhaler INHALE 2 PUFFS BY MOUTH EVERY 6 HOURS AS NEEDED FOR WHEEZING OR SHORTNESS OF BREATH. 18 g 0   Facility-Administered Medications Prior to Visit  Medication Dose Route Frequency Provider Last Rate Last Dose  . Benralizumab SOSY 30 mg  30 mg Subcutaneous Q8 Weeks Kozlow, EDonnamarie Poag MD   30 mg at 05/15/17 1213     EXAM:  LMP 05/05/2017   There is no height or weight on file to calculate BMI. Wt Readings from Last 3 Encounters:  07/02/17 (!) 309 lb 6 oz (140.3 kg)  05/19/17 (!) 304 lb 6.4 oz (138.1 kg)  12/31/16 281 lb 3.2 oz (127.6 kg)    Physical Exam: Vital signs reviewed GEML:JQGBis a well-developed well-nourished alert cooperative    who appearsr stated age in no acute distress.  HEENT: normocephalic atraumatic , Eyes: PERRL EOM's full, conjunctiva clear, Nares: paten,t no deformity  discharge or tenderness., Ears: no deformity EAC's clear TMs with normal landmarks. Mouth: clear OP, no lesions, edema.  Moist mucous membranes. Dentition in adequate repair. NECK: supple without masses, thyromegaly or bruits. CHEST/PULM:  Clear to auscultation and percussion breath sounds equal no wheeze , rales or rhonchi. No chest wall deformities or tenderness. Breast: normal by inspection . No dimpling, discharge,  masses, tenderness or discharge . CV: PMI is nondisplaced, S1 S2 no gallops, murmurs, rubs. Peripheral pulses are full without delay.No JVD .  ABDOMEN: Bowel sounds normal nontender  No guard or rebound, no hepato splenomegal no CVA tenderness.  No hernia. Extremtities:  No clubbing cyanosis or edema, no acute joint swelling or redness no focal atrophy NEURO:  Oriented x3, cranial nerves 3-12 appear to be intact, no obvious focal weakness,gait within normal limits no abnormal reflexes or asymmetrical SKIN: No acute rashes normal turgor, color, no bruising or petechiae. PSYCH: Oriented, good eye contact, no obvious depression anxiety, cognition and judgment appear normal. LN: no cervical axillary inguinal adenopathy  Lab Results  Component Value Date   WBC 8.7 01/03/2017   HGB 14.3 01/03/2017   HCT 45 01/03/2017   PLT 256 01/03/2017   GLUCOSE 87 09/05/2015   CHOL 183 09/05/2015   TRIG 78.0 09/05/2015   HDL 50.30 09/05/2015   LDLCALC 117 (H) 09/05/2015   ALT 17 09/05/2015   AST 16 09/05/2015   NA 138 02/06/2017   K 4.5 02/06/2017   CL 111 09/05/2015   CREATININE 0.8 02/06/2017   BUN 9 02/06/2017   CO2 23 09/05/2015   TSH 5.23 (H) 06/29/2017   HGBA1C 5.1 09/05/2015    BP Readings from Last 3 Encounters:  07/02/17 102/66  05/19/17 118/76  04/15/17 118/76    Lab results reviewed with patient   ASSESSMENT AND PLAN:  Discussed the following assessment and plan:  No diagnosis found.  Patient Care Team: Panosh, Standley Brooking, MD as PCP - General (Internal  Medicine) Neldon Mc Donnamarie Poag, MD as Consulting Physician (Allergy and Immunology) Adrian Prows, MD as Consulting Physician (Cardiology) There are no Patient Instructions on file for this visit.  Standley Brooking. Panosh M.D.

## 2017-07-28 ENCOUNTER — Encounter: Payer: 59 | Admitting: Internal Medicine

## 2017-07-28 NOTE — Telephone Encounter (Signed)
I had called patient couple times and left message with no response.  I will wait till she reaches out to advise Dr Bruna Potter instructions and assume she is putting Berna Bue on hold for now

## 2017-07-28 NOTE — Telephone Encounter (Signed)
Last OV 10/08/2016   Last refilled 01/29/2017 disp 30 with no refills  Sent to PCP

## 2017-07-28 NOTE — Telephone Encounter (Signed)
Pt is pregnant so medication denied   At this time. Have her obgyne   Decide if she should have this medication

## 2017-08-03 ENCOUNTER — Other Ambulatory Visit: Payer: 59

## 2017-08-03 DIAGNOSIS — Z31430 Encounter of female for testing for genetic disease carrier status for procreative management: Secondary | ICD-10-CM | POA: Diagnosis not present

## 2017-08-03 DIAGNOSIS — Z3481 Encounter for supervision of other normal pregnancy, first trimester: Secondary | ICD-10-CM | POA: Diagnosis not present

## 2017-08-03 DIAGNOSIS — O3680X9 Pregnancy with inconclusive fetal viability, other fetus: Secondary | ICD-10-CM | POA: Diagnosis not present

## 2017-08-03 DIAGNOSIS — Z3682 Encounter for antenatal screening for nuchal translucency: Secondary | ICD-10-CM | POA: Diagnosis not present

## 2017-08-04 ENCOUNTER — Other Ambulatory Visit (INDEPENDENT_AMBULATORY_CARE_PROVIDER_SITE_OTHER): Payer: 59

## 2017-08-04 ENCOUNTER — Encounter: Payer: Self-pay | Admitting: Internal Medicine

## 2017-08-04 ENCOUNTER — Inpatient Hospital Stay (HOSPITAL_COMMUNITY)
Admission: AD | Admit: 2017-08-04 | Discharge: 2017-08-05 | Disposition: A | Discharge status: Home / Self Care | Payer: 59 | Source: Ambulatory Visit | Attending: Obstetrics and Gynecology | Admitting: Obstetrics and Gynecology

## 2017-08-04 DIAGNOSIS — O4691 Antepartum hemorrhage, unspecified, first trimester: Secondary | ICD-10-CM | POA: Insufficient documentation

## 2017-08-04 DIAGNOSIS — K649 Unspecified hemorrhoids: Secondary | ICD-10-CM

## 2017-08-04 DIAGNOSIS — E039 Hypothyroidism, unspecified: Secondary | ICD-10-CM | POA: Diagnosis not present

## 2017-08-04 DIAGNOSIS — Z79899 Other long term (current) drug therapy: Secondary | ICD-10-CM | POA: Insufficient documentation

## 2017-08-04 DIAGNOSIS — K625 Hemorrhage of anus and rectum: Secondary | ICD-10-CM

## 2017-08-04 DIAGNOSIS — Z3A13 13 weeks gestation of pregnancy: Secondary | ICD-10-CM | POA: Insufficient documentation

## 2017-08-04 LAB — TSH: TSH: 3.4 u[IU]/mL (ref 0.35–4.50)

## 2017-08-05 ENCOUNTER — Encounter (HOSPITAL_COMMUNITY): Payer: Self-pay

## 2017-08-05 ENCOUNTER — Other Ambulatory Visit: Payer: Self-pay

## 2017-08-05 DIAGNOSIS — K625 Hemorrhage of anus and rectum: Secondary | ICD-10-CM | POA: Diagnosis not present

## 2017-08-05 DIAGNOSIS — Z3A13 13 weeks gestation of pregnancy: Secondary | ICD-10-CM | POA: Diagnosis not present

## 2017-08-05 DIAGNOSIS — R002 Palpitations: Secondary | ICD-10-CM | POA: Diagnosis not present

## 2017-08-05 DIAGNOSIS — Z79899 Other long term (current) drug therapy: Secondary | ICD-10-CM | POA: Diagnosis not present

## 2017-08-05 DIAGNOSIS — O4691 Antepartum hemorrhage, unspecified, first trimester: Secondary | ICD-10-CM | POA: Diagnosis not present

## 2017-08-05 NOTE — MAU Note (Signed)
Pt states she had cramping all day and then started bleeding at 2300.

## 2017-08-05 NOTE — MAU Provider Note (Signed)
Chief Complaint: Vaginal Bleeding and Abdominal Pain   First Provider Initiated Contact with Patient 08/05/17 0040        SUBJECTIVE HPI: Linda Becker is a 27 y.o. G2P0010 at 33w2dby LMP who presents to maternity admissions reporting having a bowel movement and then seeing blood in toilet.  Worried she is having a miscarriage "because the same thing happened".. She denies vaginal itching/burning, urinary symptoms, h/a, dizziness, n/v, or fever/chills.     Vaginal Bleeding  The patient's primary symptoms include pelvic pain (mild cramping) and vaginal bleeding. The patient's pertinent negatives include no genital itching, genital lesions, genital odor or vaginal discharge. This is a new problem. The current episode started today. The problem occurs rarely. The problem has been resolved. The pain is mild. She is pregnant. Associated symptoms include abdominal pain. Pertinent negatives include no back pain, constipation, diarrhea, dysuria, frequency or headaches. The vaginal discharge was bloody. The vaginal bleeding is typical of menses. She has not been passing clots. She has not been passing tissue. Nothing aggravates the symptoms. She has tried nothing for the symptoms.  Abdominal Pain  This is a new problem. The current episode started today. The onset quality is gradual. The problem occurs intermittently. The pain is located in the suprapubic region. The pain is mild. The quality of the pain is cramping. Pertinent negatives include no constipation, diarrhea, dysuria, frequency or headaches.    RN note: Pt states she had cramping all day and then started bleeding at 2300.    Past Medical History:  Diagnosis Date  . Allergic rhinitis    remote hx of allergy shots  . Asthma    since childhood  on controller meds extrinsic dr kCarmelina Peal . GERD (gastroesophageal reflux disease)    on nexium  for  long term sx since childhood  . H/O miscarriage, not currently pregnant    [redacted] weeks   march 2016  . Migraine   . Murmur    pt reports MVP  . Syncope    under eval ? cause  . Tachycardia    episodes with near syncope eval dr GNadyne Coombes on no meds dced LABA   Past Surgical History:  Procedure Laterality Date  . broken right femur  2008   rod placed  . TONSILLECTOMY  2006  . WISDOM TOOTH EXTRACTION     Social History   Socioeconomic History  . Marital status: Divorced    Spouse name: Not on file  . Number of children: Not on file  . Years of education: Not on file  . Highest education level: Not on file  Social Needs  . Financial resource strain: Not on file  . Food insecurity - worry: Not on file  . Food insecurity - inability: Not on file  . Transportation needs - medical: Not on file  . Transportation needs - non-medical: Not on file  Occupational History  . Not on file  Tobacco Use  . Smoking status: Never Smoker  . Smokeless tobacco: Never Used  Substance and Sexual Activity  . Alcohol use: No    Alcohol/week: 0.0 oz    Frequency: Never  . Drug use: No  . Sexual activity: No    Birth control/protection: None  Other Topics Concern  . 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    Current Outpatient Medications on File Prior to Encounter  Medication Sig Dispense Refill  . ADVAIR DISKUS 500-50 MCG/DOSE AEPB INHALE 1 PUFF BY MOUTH 2 TIMES DAILY TO PREVENT COUGH OR WHEEZE. RINSE MOUTH AFTER USE. 60 each 0  . albuterol (PROVENTIL) (2.5 MG/3ML) 0.083% nebulizer solution Inhale the contents of one vial in nebulizer every four to six hours as needed for cough or wheeze. 60 vial 1  . ALL DAY ALLERGY 10 MG tablet TAKE 1 TABLET (10 MG TOTAL) BY MOUTH DAILY. 30 tablet 5  . baclofen (LIORESAL)  10 MG tablet TAKE ONE TABLET BY MOUTH TWICE DAILY LIMIT 2 DAYS PER WEEK  0  . Beclomethasone Dipropionate (QNASL) 80 MCG/ACT AERS Use one to two puffs in each nostril once daily as directed. 1 Inhaler 5  . Benralizumab (FASENRA) 30 MG/ML SOSY Inject 30 mg into the skin every 28 (twenty-eight) days. For 3 doses. Then 27m every 8 weeks 1 Syringe 8  . Cholecalciferol (VITAMIN D3) 1000 UNIT/SPRAY LIQD Take by mouth at bedtime.    .Marland Kitchenesomeprazole (NEXIUM) 40 MG capsule Take 1 capsule (40 mg total) by mouth daily. 30 capsule 3  . levothyroxine (SYNTHROID, LEVOTHROID) 100 MCG tablet Take 100 mcg by mouth daily before breakfast.    . montelukast (SINGULAIR) 10 MG tablet Take 1 tablet (10 mg total) by mouth daily. 90 tablet 1  . Olopatadine HCl (PATADAY) 0.2 % SOLN Apply 1 drop to eye daily as needed.     . Prenatal Multivit-Min-Fe-FA (PRE-NATAL PO) Take by mouth daily.    . Probiotic Product (PROBIOTIC PO) Take by mouth daily.    . promethazine (PHENERGAN) 25 MG tablet Take 1 tablet (25 mg total) by mouth every 6 (six) hours as needed for nausea or vomiting (headache). 30 tablet 0  . VENTOLIN HFA 108 (90 Base) MCG/ACT inhaler INHALE 2 PUFFS BY MOUTH EVERY 6 HOURS AS NEEDED FOR WHEEZING OR SHORTNESS OF BREATH. 18 g 0   Allergies  Allergen Reactions  . Progesterone Rash    Was in a form of birth control.    I have reviewed patient's Past Medical Hx, Surgical Hx, Family Hx, Social Hx, medications and allergies.   ROS:  Review of Systems  Gastrointestinal: Positive for abdominal pain. Negative for constipation and diarrhea.  Genitourinary: Positive for pelvic pain (mild cramping) and vaginal bleeding. Negative for dysuria, frequency and vaginal discharge.  Musculoskeletal: Negative for back pain.  Neurological: Negative for headaches.   Review of Systems  Other systems negative   Physical Exam  Physical Exam Patient Vitals for the past 24 hrs:  BP Temp Temp src Pulse Resp SpO2 Weight   08/05/17 0027 121/63 98.5 F (36.9 C) Oral 82 16 100 % (!) 311 lb 12 oz (141.4 kg)   Constitutional: Well-developed, well-nourished female in no acute distress.  Cardiovascular: normal rate Respiratory: normal effort GI: Abd soft, non-tender. Pos BS x 4 MS: Extremities nontender, no edema, normal ROM Neurologic: Alert and oriented x 4.  GU: Neg CVAT.  PELVIC EXAM: Cervix pink, visually closed, without lesion, scant white creamy discharge, vaginal walls and external genitalia normal   NO color to vaginal discharge at all.  Cervix closed, only white discharge seen Bimanual exam: Cervix 0/long/high, firm, anterior, neg CMT, uterus nontender, nonenlarged, adnexa without tenderness, enlargement, or mass  FHT  150 by bedside US.  Fetus is active.  LAB RESULTS Results for orders placed or performed in visit on 08/04/17 (from the past 24 hour(s))  TSH     Status: None   Collection Time: 08/04/17  9:21 AM  Result Value Ref Range   TSH 3.40 0.35 - 4.50 uIU/mL       IMAGING No results found.  MAU Management/MDM: Pelvic exam reveals no presence of blood in vaginal vault Suspect the bleeding was likely rectal from hemorrhoid, pt has known hemorrhoids which have improved with use of stool softener  ASSESSMENT SIUP at 60w2dBleeding, thought to be vaginal, likely rectal, resolved  PLAN Discharge home Discussed with Dr CRogue BussingWill discharge home with bleeding precautions. Return if bleeding recurs.  Pt stable at time of discharge. Encouraged to return here or to other Urgent Care/ED if she develops worsening of symptoms, increase in pain, fever, or other concerning symptoms.    MHansel FeinsteinCNM, MSN Certified Nurse-Midwife 08/05/2017  12:40 AM

## 2017-08-05 NOTE — Discharge Instructions (Signed)
Hemorrhoids Hemorrhoids are swollen veins in and around the rectum or anus. There are two types of hemorrhoids:  Internal hemorrhoids. These occur in the veins that are just inside the rectum. They may poke through to the outside and become irritated and painful.  External hemorrhoids. These occur in the veins that are outside of the anus and can be felt as a painful swelling or hard lump near the anus.  Most hemorrhoids do not cause serious problems, and they can be managed with home treatments such as diet and lifestyle changes. If home treatments do not help your symptoms, procedures can be done to shrink or remove the hemorrhoids. What are the causes? This condition is caused by increased pressure in the anal area. This pressure may result from various things, including:  Constipation.  Straining to have a bowel movement.  Diarrhea.  Pregnancy.  Obesity.  Sitting for long periods of time.  Heavy lifting or other activity that causes you to strain.  Anal sex.  What are the signs or symptoms? Symptoms of this condition include:  Pain.  Anal itching or irritation.  Rectal bleeding.  Leakage of stool (feces).  Anal swelling.  One or more lumps around the anus.  How is this diagnosed? This condition can often be diagnosed through a visual exam. Other exams or tests may also be done, such as:  Examination of the rectal area with a gloved hand (digital rectal exam).  Examination of the anal canal using a small tube (anoscope).  A blood test, if you have lost a significant amount of blood.  A test to look inside the colon (sigmoidoscopy or colonoscopy).  How is this treated? This condition can usually be treated at home. However, various procedures may be done if dietary changes, lifestyle changes, and other home treatments do not help your symptoms. These procedures can help make the hemorrhoids smaller or remove them completely. Some of these procedures involve  surgery, and others do not. Common procedures include:  Rubber band ligation. Rubber bands are placed at the base of the hemorrhoids to cut off the blood supply to them.  Sclerotherapy. Medicine is injected into the hemorrhoids to shrink them.  Infrared coagulation. A type of light energy is used to get rid of the hemorrhoids.  Hemorrhoidectomy surgery. The hemorrhoids are surgically removed, and the veins that supply them are tied off.  Stapled hemorrhoidopexy surgery. A circular stapling device is used to remove the hemorrhoids and use staples to cut off the blood supply to them.  Follow these instructions at home: Eating and drinking  Eat foods that have a lot of fiber in them, such as whole grains, beans, nuts, fruits, and vegetables. Ask your health care provider about taking products that have added fiber (fiber supplements).  Drink enough fluid to keep your urine clear or pale yellow. Managing pain and swelling  Take warm sitz baths for 20 minutes, 3-4 times a day to ease pain and discomfort.  If directed, apply ice to the affected area. Using ice packs between sitz baths may be helpful. ? Put ice in a plastic bag. ? Place a towel between your skin and the bag. ? Leave the ice on for 20 minutes, 2-3 times a day. General instructions  Take over-the-counter and prescription medicines only as told by your health care provider.  Use medicated creams or suppositories as told.  Exercise regularly.  Go to the bathroom when you have the urge to have a bowel movement. Do not wait.    Avoid straining to have bowel movements.  Keep the anal area dry and clean. Use wet toilet paper or moist towelettes after a bowel movement.  Do not sit on the toilet for long periods of time. This increases blood pooling and pain. Contact a health care provider if:  You have increasing pain and swelling that are not controlled by treatment or medicine.  You have uncontrolled bleeding.  You  have difficulty having a bowel movement, or you are unable to have a bowel movement.  You have pain or inflammation outside the area of the hemorrhoids. This information is not intended to replace advice given to you by your health care provider. Make sure you discuss any questions you have with your health care provider. Document Released: 05/30/2000 Document Revised: 10/31/2015 Document Reviewed: 02/14/2015 Elsevier Interactive Patient Education  2018 [employer]  

## 2017-08-06 ENCOUNTER — Encounter: Payer: Self-pay | Admitting: Endocrinology

## 2017-08-06 ENCOUNTER — Other Ambulatory Visit: Payer: Self-pay | Admitting: Family Medicine

## 2017-08-06 ENCOUNTER — Other Ambulatory Visit: Payer: Self-pay | Admitting: Allergy and Immunology

## 2017-08-06 ENCOUNTER — Ambulatory Visit (INDEPENDENT_AMBULATORY_CARE_PROVIDER_SITE_OTHER): Payer: 59 | Admitting: Endocrinology

## 2017-08-06 VITALS — BP 132/68 | HR 108 | Ht 66.0 in | Wt 311.6 lb

## 2017-08-06 DIAGNOSIS — E039 Hypothyroidism, unspecified: Secondary | ICD-10-CM | POA: Diagnosis not present

## 2017-08-06 MED ORDER — LEVOTHYROXINE SODIUM 88 MCG PO TABS: 88.0000 ug | ORAL_TABLET | Freq: Every day | ORAL | 3 refills | Status: DC

## 2017-08-06 MED FILL — LEVOTHYROXINE 88 MCG TABLET: 88 | 30 days supply | Qty: 30 | Fill #0

## 2017-08-06 MED FILL — ESOMEPRAZOLE MAG DR 40 MG C: 40 | 30 days supply | Qty: 60 | Fill #0

## 2017-08-06 MED FILL — ONDANSETRON HCL 4 MG TABLET: 4 | 10 days supply | Qty: 30 | Fill #0

## 2017-08-06 MED FILL — PROAIR HFA 90 MCG INHALER: 108 (90 BAS | 25 days supply | Qty: 9 | Fill #0

## 2017-08-06 MED FILL — lamoTRIgine 100 MG TABS: 100 | 30 days supply | Qty: 30 | Fill #0

## 2017-08-06 NOTE — Progress Notes (Signed)
Patient ID: Linda Becker, female   DOB: 1991-06-16, 27 y.o.   MRN: [id number]             Reason for Appointment:  Hypothyroidism, follow-up visit    History of Present Illness:    Hypothyroidism was first diagnosed in 10/2016  At the time of diagnosis patient was having symptoms of  fatigue, cold sensitivity, weight gain, brittle nails and hair loss .          However her symptoms had been present for several years and not any worse She was tested with her TSH because of needing to start lithium therapy Baseline TSH was 6.7, no baseline free T4 or free T3 available  Prior to consultation she had been treated with  liothyronine, initially 25 g daily and then 50 g by her psychiatrist for treatment of her high TSH The patient did not subjectively feeling any better with taking the liothyronine; with this she was having palpitations, shortness of breath and feeling excessively warm and sweaty Because of her symptoms she was told to stop the liothyronine and have baseline labs done again  RECENT history:  She had been observed without any supplementation prior to her pregnancy and she was only mildly symptomatic Her highest TSH recently has been 5.2  Because of her early pregnancy her gynecologist recommended levothyroxine supplementation and patient now says that she was given a prescription for 137 g although she told me was 100 on her last visit  Since she had only very MILD hypothyroidism she was told not to take a large doses of 137 g and only take a half a tablet She does feel subjectively much better with her energy level but also is not having as much problems with morning sickness She does have some headaches now He gets hot and cold alternating  Her weight has gone up slightly only, she is now [redacted] weeks pregnant  Currently her TSH is improved at 3.4 with her taking the equivalent of 68.5 micrograms levothyroxine         Patient's weight history is as  follows:  Wt Readings from Last 3 Encounters:  08/06/17 (!) 311 lb 9.6 oz (141.3 kg)  08/05/17 (!) 311 lb 12 oz (141.4 kg)  07/02/17 (!) 309 lb 6 oz (140.3 kg)    TPO antibody is negative  Thyroid function results have been as follows:  Lab Results  Component Value Date   TSH 3.40 08/04/2017   TSH 5.23 (H) 06/29/2017   TSH 5.97 (A) 02/06/2017   TSH 2.16 09/05/2015   FREET4 0.91 06/29/2017   FREET4 0.93 09/05/2015   T3FREE 3.5 06/29/2017    02/05/17: Free thyroxine index low at 1.0   Past Medical History:  Diagnosis Date  . Allergic rhinitis    remote hx of allergy shots  . Asthma    since childhood  on controller meds extrinsic dr Carmelina Peal  . GERD (gastroesophageal reflux disease)    on nexium  for  long term sx since childhood  . H/O miscarriage, not currently pregnant    [redacted] weeks  march 2016  . Migraine   . Murmur    pt reports MVP  . Syncope    under eval ? cause  . Tachycardia    episodes with near syncope eval dr Nadyne Coombes  on no meds dced LABA    Past Surgical History:  Procedure Laterality Date  . broken right femur  2008   rod placed  . TONSILLECTOMY  2006  . WISDOM TOOTH EXTRACTION      Family History  Problem Relation Age of Onset  . Asthma Father   . Arthritis Father   . Hyperlipidemia Father   . Heart disease Father   . Hypertension Father   . Diabetes Father   . Arthritis Mother   . Hyperlipidemia Mother   . Heart disease Mother   . Hypertension Mother   . Diabetes Mother   . Arthritis Maternal Grandmother   . Breast cancer Maternal Grandmother   . Thyroid disease Maternal Grandmother   . Colon cancer Paternal Grandfather   . Diabetes Paternal Grandfather   . Breast cancer Paternal Grandmother   . Diabetes Paternal Grandmother   . Breast cancer Paternal Aunt   . Breast cancer Paternal Aunt   . Diabetes Maternal Grandfather     Social History:  reports that  has never smoked. she has never used smokeless tobacco. She reports that she  does not drink alcohol or use drugs.  Allergies:  Allergies  Allergen Reactions  . Progesterone Rash    Was in a form of birth control.    Allergies as of 08/06/2017      Reactions   Progesterone Rash   Was in a form of birth control.      Medication List        Accurate as of 08/06/17  3:29 PM. Always use your most recent med list.          ADVAIR DISKUS 500-50 MCG/DOSE Aepb Generic drug:  Fluticasone-Salmeterol INHALE 1 PUFF BY MOUTH 2 TIMES DAILY TO PREVENT COUGH OR WHEEZE. RINSE MOUTH AFTER USE.   albuterol (2.5 MG/3ML) 0.083% nebulizer solution Commonly known as:  PROVENTIL Inhale the contents of one vial in nebulizer every four to six hours as needed for cough or wheeze.   VENTOLIN HFA 108 (90 Base) MCG/ACT inhaler Generic drug:  albuterol INHALE 2 PUFFS BY MOUTH EVERY 6 HOURS AS NEEDED FOR WHEEZING OR SHORTNESS OF BREATH.   ALL DAY ALLERGY 10 MG tablet Generic drug:  cetirizine TAKE 1 TABLET (10 MG TOTAL) BY MOUTH DAILY.   baclofen 10 MG tablet Commonly known as:  LIORESAL TAKE ONE TABLET BY MOUTH TWICE DAILY LIMIT 2 DAYS PER WEEK   Beclomethasone Dipropionate 80 MCG/ACT Aers Commonly known as:  QNASL Use one to two puffs in each nostril once daily as directed.   esomeprazole 40 MG capsule Commonly known as:  NEXIUM TAKE ONE CAPSULE TWICE DAILY AS DIRECTED   levothyroxine 100 MCG tablet Commonly known as:  SYNTHROID, LEVOTHROID Take 50 mcg by mouth daily before breakfast.   montelukast 10 MG tablet Commonly known as:  SINGULAIR Take 1 tablet (10 mg total) by mouth daily.   PATADAY 0.2 % Soln Generic drug:  Olopatadine HCl Apply 1 drop to eye daily as needed.   PRE-NATAL PO Take by mouth daily.   PROBIOTIC PO Take by mouth daily.   promethazine 25 MG tablet Commonly known as:  PHENERGAN Take 1 tablet (25 mg total) by mouth every 6 (six) hours as needed for nausea or vomiting (headache).   Vitamin D3 1000 UNIT/SPRAY Liqd Take by mouth at  bedtime.          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  Currently the patient is taking 68.5 mg of levothyroxine which was started because of her pregnancy Her gynecologist wanted her to take 137 g apparently Discussed that this is an excessive dose for mild hypothyroidism and she did have symptomatic iatrogenic hyperthyroidism when she was taking liothyronine from her psychiatrist previously  Her TSH is now 3.4 which is just slightly above her target  Eye exam does not show any features of Cushing's disease: Her pinkish striae on her abdomen are related to weight gain and not typical of Cushing's  PLAN:   Because of her pregnancy she will need to increase her doses of levothyroxine She will go up to 88 g now instead of 68 She will try to take this consistently in the morning  Follow-up in 4 weeks with labs   Elayne Snare 08/06/2017, 3:29 PM    Note: This office note was prepared with Dragon voice recognition system technology. Any transcriptional errors that result from this process are unintentional.

## 2017-08-07 ENCOUNTER — Other Ambulatory Visit: Payer: Self-pay | Admitting: *Deleted

## 2017-08-07 MED ORDER — BUDESONIDE 180 MCG/ACT IN AEPB: 2.0000 | INHALATION_SPRAY | Freq: Two times a day (BID) | RESPIRATORY_TRACT | 5 refills | Status: DC

## 2017-08-07 MED FILL — SYMBICORT 160-4.5 MCG INH: 160-4.5 | 30 days supply | Qty: 10 | Fill #0

## 2017-08-07 NOTE — Telephone Encounter (Signed)
Declined    I haven't seen her since April    2018 .     It appears she is pregnant     meds  Should be prescribed by her OB GYNE

## 2017-08-07 NOTE — Telephone Encounter (Signed)
Sent to [facility]   Last OV 10/08/2016   Last refilled 01/29/2017 disp 30 with no refills

## 2017-08-07 NOTE — Addendum Note (Signed)
Addended by: Carin Hock on: 08/07/2017 09:40 AM   Modules accepted: Orders

## 2017-08-07 NOTE — Telephone Encounter (Signed)
Had received refill request for patient for Advair and denied same due to pregnancy that was mentioned last month.  T/C form patient advised the info regarding Berna Bue and pregnancy but also advised we need to change her Advair to Pulmicort will send Rx.  Patient advised she has upcoming appt with [place] in March. Advised her if she has any problems to contact us sooner

## 2017-08-26 DIAGNOSIS — Z3480 Encounter for supervision of other normal pregnancy, unspecified trimester: Secondary | ICD-10-CM | POA: Diagnosis not present

## 2017-09-01 ENCOUNTER — Encounter: Payer: Self-pay | Admitting: Allergy and Immunology

## 2017-09-01 ENCOUNTER — Ambulatory Visit (INDEPENDENT_AMBULATORY_CARE_PROVIDER_SITE_OTHER): Payer: 59 | Admitting: Allergy and Immunology

## 2017-09-01 VITALS — BP 116/76 | HR 100 | Resp 20

## 2017-09-01 DIAGNOSIS — Z3A18 18 weeks gestation of pregnancy: Secondary | ICD-10-CM | POA: Diagnosis not present

## 2017-09-01 DIAGNOSIS — H1010 Acute atopic conjunctivitis, unspecified eye: Secondary | ICD-10-CM | POA: Diagnosis not present

## 2017-09-01 DIAGNOSIS — J4550 Severe persistent asthma, uncomplicated: Secondary | ICD-10-CM | POA: Diagnosis not present

## 2017-09-01 DIAGNOSIS — J3089 Other allergic rhinitis: Secondary | ICD-10-CM | POA: Diagnosis not present

## 2017-09-01 DIAGNOSIS — K219 Gastro-esophageal reflux disease without esophagitis: Secondary | ICD-10-CM | POA: Diagnosis not present

## 2017-09-01 DIAGNOSIS — J4520 Mild intermittent asthma, uncomplicated: Secondary | ICD-10-CM | POA: Diagnosis not present

## 2017-09-01 MED ORDER — ALBUTEROL SULFATE (2.5 MG/3ML) 0.083% IN NEBU: INHALATION_SOLUTION | RESPIRATORY_TRACT | 1 refills | Status: DC

## 2017-09-01 NOTE — Progress Notes (Signed)
Follow-up Note  Referring Provider: Burnis Medin, MD Primary Provider: Burnis Medin, MD Date of Office Visit: 09/01/2017  Subjective:   Linda Becker (DOB: 1991/04/28) is a 27 y.o. female who returns to the [facility] on 09/01/2017 in re-evaluation of the following:  HPI: [name] returns to this clinic in reevaluation of her asthma and allergic rhinoconjunctivitis and reflux.  I last saw her in this clinic 15 April 2017 at which point in time she was doing very well while using a collection of anti-inflammatory agents and therapy directed against reflux in conjunction with benralizumab administration.  She did quite well and is now pregnant presently at [redacted] weeks gestation.  We attempted to switch her Advair to Pulmicort as a result of her pregnancy but that was denied by her insurance company and she ended up on Symbicort.  She spent the past 3 weeks in [place] and did quite well but unfortunately upon returning she has had significant problems with nasal congestion and slight headache and coughing and shortness of breath requiring her to use a bronchodilator on a daily basis.  She has not had any ugly nasal discharge or anosmia or chest pain or sputum production or other issues with this scenario.  When she found out that she was pregnant she discontinued her benralizumab administration.  Her last dose was December 2018.  Her reflux appears to be under good control on Nexium twice a day.  She remains away from caffeine consumption.  Allergies as of 09/01/2017      Reactions   Progesterone Rash   Was in a form of birth control.      Medication List      albuterol (2.5 MG/3ML) 0.083% nebulizer solution Commonly known as:  PROVENTIL Inhale the contents of one vial in nebulizer every four to six hours as needed for cough or wheeze.   VENTOLIN HFA 108 (90 Base) MCG/ACT inhaler Generic drug:  albuterol INHALE 2 PUFFS BY MOUTH EVERY 6 HOURS AS NEEDED  FOR WHEEZING OR SHORTNESS OF BREATH.   ALL DAY ALLERGY 10 MG tablet Generic drug:  cetirizine TAKE 1 TABLET (10 MG TOTAL) BY MOUTH DAILY.   baclofen 10 MG tablet Commonly known as:  LIORESAL TAKE ONE TABLET BY MOUTH TWICE DAILY LIMIT 2 DAYS PER WEEK   Beclomethasone Dipropionate 80 MCG/ACT Aers Commonly known as:  QNASL Use one to two puffs in each nostril once daily as directed.   esomeprazole 40 MG capsule Commonly known as:  NEXIUM TAKE ONE CAPSULE TWICE DAILY AS DIRECTED   levothyroxine 88 MCG tablet Commonly known as:  SYNTHROID, LEVOTHROID Take 1 tablet (88 mcg total) by mouth daily.   montelukast 10 MG tablet Commonly known as:  SINGULAIR Take 1 tablet (10 mg total) by mouth daily.   ondansetron 4 MG tablet Commonly known as:  ZOFRAN   PATADAY 0.2 % Soln Generic drug:  Olopatadine HCl Apply 1 drop to eye daily as needed.   PRE-NATAL PO Take by mouth daily.   PROBIOTIC PO Take by mouth daily.   promethazine 25 MG tablet Commonly known as:  PHENERGAN Take 1 tablet (25 mg total) by mouth every 6 (six) hours as needed for nausea or vomiting (headache).   SYMBICORT 160-4.5 MCG/ACT inhaler Generic drug:  budesonide-formoterol   Vitamin D3 1000 UNIT/SPRAY Liqd Take by mouth at bedtime.       Past Medical History:  Diagnosis Date  . Allergic rhinitis    remote  hx of allergy shots  . Asthma    since childhood  on controller meds extrinsic dr Carmelina Peal  . GERD (gastroesophageal reflux disease)    on nexium  for  long term sx since childhood  . H/O miscarriage, not currently pregnant    [redacted] weeks  march 2016  . Migraine   . Murmur    pt reports MVP  . Syncope    under eval ? cause  . Tachycardia    episodes with near syncope eval dr Nadyne Coombes  on no meds dced LABA    Past Surgical History:  Procedure Laterality Date  . broken right femur  2008   rod placed  . TONSILLECTOMY  2006  . WISDOM TOOTH EXTRACTION      Review of systems negative except as  noted in HPI / PMHx or noted below:  Review of Systems  Constitutional: Negative.   HENT: Negative.   Eyes: Negative.   Respiratory: Negative.   Cardiovascular: Negative.   Gastrointestinal: Negative.   Genitourinary: Negative.   Musculoskeletal: Negative.   Skin: Negative.   Neurological: Negative.   Endo/Heme/Allergies: Negative.   Psychiatric/Behavioral: Negative.      Objective:   Vitals:   09/01/17 1048  BP: 116/76  Pulse: 100  Resp: 20          Physical Exam  Constitutional: She is well-developed, well-nourished, and in no distress.  HENT:  Head: Normocephalic.  Right Ear: Tympanic membrane, external ear and ear canal normal.  Left Ear: Tympanic membrane, external ear and ear canal normal.  Nose: Nose normal. No mucosal edema or rhinorrhea.  Mouth/Throat: Uvula is midline, oropharynx is clear and moist and mucous membranes are normal. No oropharyngeal exudate.  Eyes: Conjunctivae are normal.  Neck: Trachea normal. No tracheal tenderness present. No tracheal deviation present. No thyromegaly present.  Cardiovascular: Normal rate, regular rhythm, S1 normal and S2 normal.  Murmur (Systolic) heard. Pulmonary/Chest: Breath sounds normal. No stridor. No respiratory distress. She has no wheezes. She has no rales.  Musculoskeletal: She exhibits no edema.  Lymphadenopathy:       Head (right side): No tonsillar adenopathy present.       Head (left side): No tonsillar adenopathy present.    She has no cervical adenopathy.  Neurological: She is alert. Gait normal.  Skin: No rash noted. She is not diaphoretic. No erythema. Nails show no clubbing.  Psychiatric: Mood and affect normal.    Diagnostics:    Spirometry was performed and demonstrated an FEV1 of 3.32 at 95 % of predicted.  Assessment and Plan:   1. Not well controlled severe persistent asthma   2. Other allergic rhinitis   3. Seasonal allergic conjunctivitis   4. Gastroesophageal reflux disease,  esophagitis presence not specified   5. [redacted] weeks gestation of pregnancy     1. Continue Symbicort 160 two inhalation two times per day with spacer  2. Continue montelukast 54m one tablet one time per day  3. Continue Nexium 40 mg one tablet twice a day  4. Continue Qnasl 80 - 1-2 puffs each nostril 3-7 times per week    5. Continue ProAir HFA 2 inhalations every 4-6 hours of needed.  6. Continue antihistamine if needed.  7. For this recent episode:   A. Prednisone 137mone tablet one time per day for 10 days  B. Lots of nasal saline (hold Qnasl)  8. Return in 3 weeks or earlier if problem.   I will treat [place] though she  has a viral induced flare of her respiratory tract disease with very low-dose steroids and continued use of anti-inflammatory agents for her respiratory tract as noted above.  Hopefully this most recent scenario is not a reflection of springtime pollen exposure and she will resolve this viral induced flare over the course of the next week or so.  She will also continue to use Nexium on a regular basis as noted above.  We will see how things go over the course of the next 3 weeks.  Allena Katz, MD [facility]

## 2017-09-01 NOTE — Patient Instructions (Addendum)
  1. Continue Symbicort 160 two inhalation two times per day with spacer  2. Continue montelukast 17m one tablet one time per day  3. Continue Nexium 40 mg one tablet twice a day  4. Continue Qnasl 80 - 1-2 puffs each nostril 3-7 times per week    5. Continue ProAir HFA 2 inhalations every 4-6 hours of needed.  6. Continue antihistamine if needed.  7. For this recent episode:   A. Prednisone 140mone tablet one time per day for 10 days  B. Lots of nasal saline (hold Qnasl)  8. Return in 3 weeks or earlier if problem.

## 2017-09-02 ENCOUNTER — Encounter: Payer: Self-pay | Admitting: Allergy and Immunology

## 2017-09-07 ENCOUNTER — Other Ambulatory Visit: Payer: 59

## 2017-09-07 ENCOUNTER — Encounter (HOSPITAL_COMMUNITY): Payer: Self-pay

## 2017-09-08 ENCOUNTER — Ambulatory Visit (HOSPITAL_COMMUNITY)
Admission: RE | Admit: 2017-09-08 | Discharge: 2017-09-08 | Disposition: A | Discharge status: Home / Self Care | Payer: 59 | Source: Ambulatory Visit | Attending: Obstetrics and Gynecology | Admitting: Obstetrics and Gynecology

## 2017-09-08 ENCOUNTER — Encounter (HOSPITAL_COMMUNITY): Payer: Self-pay

## 2017-09-08 DIAGNOSIS — Z3A18 18 weeks gestation of pregnancy: Secondary | ICD-10-CM | POA: Diagnosis not present

## 2017-09-08 DIAGNOSIS — O352XX1 Maternal care for (suspected) hereditary disease in fetus, fetus 1: Secondary | ICD-10-CM

## 2017-09-08 DIAGNOSIS — O352XX0 Maternal care for (suspected) hereditary disease in fetus, not applicable or unspecified: Secondary | ICD-10-CM | POA: Diagnosis not present

## 2017-09-08 NOTE — Progress Notes (Signed)
Genetic Counseling  Visit Summary Note  Appointment Date: 09/08/2017 Referred By: Allyn Kenner, DO  Date of Birth: 1990/08/09  Pregnancy history: G2P0010 Estimated Date of Delivery: 02/08/18 Estimated Gestational Age: [redacted]w[redacted]d I met with Ms. BAvonelle Viverosand her partner, Mr. MClydene Laming for genetic counseling regarding two low fetal fraction noninvasive screening results.   In summary:  Discussed Panorama results  Low fetal fraction X2  Reviewed explanations for a low fetal fraction  Inc maternal BMI, normal variation, gestational age, maternal conditions/medications, and specific chromosome conditions  Discussed [employer]'s FFBR algorithm, which did not indicate that this pregnancy has an inc risk for fetal trisomies 13/18 and triploidy    Discussed additional options of screening / testing  Detailed ultrasound-scheduled at referring provider's office (reviewed benefits and limitations)  NIPS through [facility](after [redacted] weeks gestation) versus another NIPS laboratory-declined  Quad screening-declined  Amniocentesis-declined  Discussed general population carrier screening options  Horizon 4 carrier screening performed at referring provider's office; screen negative for DMD, CF, SMA, and fragile X  The patient was seen at the referring provider's office for NT and first trimester screening on August 03, 2017. An accurate NT measurement was not obtained and FTS was not able to be performed. The patient elected to have Panorama screening for fetal aneuploidy at that time. A result was not obtained due to an insufficient fetal (placental) DNA (2.0%) sample. A second sample was drawn on August 26, 2017, which also returned with a low percentage of fetal (placental) DNA (2.7%). We reviewed NIPS technology and various causes for an insufficient fetal (placental) DNA percentage. These etiologies include: normal variation, increased maternal BMI (patient's weight was 305 at the  time of her second blood draw), use of anticoagulation medications during pregnancy, early gestational age, and specific fetal aneuploidy (those associated with formation of a small placenta).   We discussed that PLucina Mellowis a brand of NIPS performed through [facility] NJohnsie Cancelrecently published data looking at outcomes of pregnancies which were found to have a low percentage of fetal (placental DNA). They developed an algorithm, which is called fetal fraction based risk (FFBR), to determine which pregnancies with a low fetal fraction are more likely to have an increased risk of having a fetus with trisomy 1105 trisomy 138 and triploidy. Those found to have a high FFBR score are given a "high risk" low fetal fraction result. I discussed with Ms. HKnoblochthat she was given a low risk FFBR score and therefore, was not found to have an increased likelihood of having a low fetal fraction due to these aneuploidy conditions. We reviewed her BMI as the most likely contributing factor for her results. We also discussed that fetal fraction increases by approximately 0.1% per week until [redacted] weeks gestation, at which time it increases by approximately 1% per week.   We reviewed a variety of other screening and diagnostic testing options for fetal aneuploidy, including a detailed anatomy ultrasound, repeat NIPS (although a third sample is unlikely to provide a result) through [facility] another laboratory (at a gestational age greater than 256 weeks, Quad screening, and amniocentesis. The benefits, limitations, sensitivity, specificity, and risks of these tests were reviewed in detail. After thoughtful consideration, Ms. HMatthesstated that she would like to pursue detailed ultrasound. This appointment was previously scheduled at her referring provider's office. She understands that ultrasound cannot detect all genetic conditions/birth defects. She declined repeat NIPS, Quad screening, and diagnostic testing at this time. The  patient stated that she  is not interested in diagnostic testing under any circumstance, given the associated risk for complications. She expressed that termination of pregnancy is not something she would consider, even in the unlikely event that the fetus was diagnosed with a specific genetic condition.   Both family histories were reviewed and found to be contributory for the patient having a paternal first cousin who had apparently isolated cleft lip with cleft palate. We reviewed that cleft lip with or without cleft palate (CLP) can be an isolated birth difference or a feature of an underlying genetic condition. We reviewed that isolated CLP is most often multifactorial in etiology. If this relative had an isolated CLP, the risk of recurrence is not expected to be increased for the fetus, given the degree of relatedness. If however, this relative had a genetic condition, the risk of recurrence depends on the inheritance of the condition.   In addition, Ms. Clagett reported that her nephew has autism. We discussed that autism is part of the spectrum of conditions referred to as autistic spectrum disorders (ASD).  We discussed that ASDs are among the most common neurodevelopmental disorders, with approximately 1 in 110 children meeting criteria for ASD.  Approximately 80% of individuals diagnosed are female.  There is strong evidence that genetic factors play a critical role in development of ASD.  There have been recent advances in identifying specific genetic causes of ASD, however, there are still many individuals for whom the etiology of the ASD is not known.  Once a family has a child with a diagnosis of ASD, there is a 13.5% chance to have another child with ASD. Based on what we know about this family, there should be an approximate 3-4% risk of recurrence for this pregnancy. They understand that at this time there is not genetic testing available for ASD for most families.  The patient and her partner  reported that they have family members with intellectual disability of unknown etiology. The patient's paternal first cousin has significant intellectual disability and may have cerebral palsy. The patient has very limited information regarding this relative and her history. The FOB reported that his maternal first cousin once-removed has intellectual disability. They were counseled that there are many different causes of intellectual disabilities including environmental, multifactorial, and genetic etiologies.  We discussed that a specific diagnosis for intellectual disability can be determined in approximately 50% of these individuals.  In the remaining 50% of individuals, a diagnosis may never be determined.  Regarding genetic causes, we discussed that chromosome aberrations (aneuploidy, deletions, duplications, insertions, and translocations) are responsible for a small percentage of individuals with intellectual disability.  Many individuals with chromosome aberrations have additional differences, including congenital anomalies or minor dysmorphisms.  Likewise, single gene conditions are the underlying cause of intellectual delay in some families.  We discussed that many gene conditions have intellectual disability as a feature, but also often include other physical or medical differences. We discussed that without more specific information, it is difficult to provide an accurate risk assessment.  Further genetic counseling is warranted if more information is obtained.  Ms. Diviney was provided with written information regarding routine carrier screening for cystic fibrosis (CF), spinal muscular atrophy (SMA) and hemoglobinopathies including the carrier frequency, availability of carrier screening and prenatal diagnosis if indicated.  In addition, we discussed that CF and hemoglobinopathies are routinely screened for as part of the [place] newborn screening panel.  Ms. Mcgloin previously had the Horizon 4 carrier  screen through her referring provider's office.  We reviewed the screen negative results and the reduction in carrier risks.   Ms. Rajagopalan denied exposure to environmental toxins or chemical agents. She denied the use of alcohol, tobacco or street drugs. She denied significant viral illnesses during the course of her pregnancy.   I counseled this couple regarding the above risks and available options.  The approximate face-to-face time with the genetic counselor was 34 minutes.  Filbert Schilder, MS  Certified Genetic Counselor

## 2017-09-10 ENCOUNTER — Ambulatory Visit: Payer: 59 | Admitting: Endocrinology

## 2017-09-10 ENCOUNTER — Other Ambulatory Visit: Payer: Self-pay

## 2017-09-10 DIAGNOSIS — Z0289 Encounter for other administrative examinations: Secondary | ICD-10-CM

## 2017-09-10 MED ORDER — SYMBICORT 160-4.5 MCG/ACT IN AERO: INHALATION_SPRAY | RESPIRATORY_TRACT | 5 refills | Status: DC

## 2017-09-10 MED FILL — lamoTRIgine 100 MG TABS: 100 | 30 days supply | Qty: 30 | Fill #1

## 2017-09-10 MED FILL — SYMBICORT 160-4.5 MCG INH: 160-4.5 | 30 days supply | Qty: 10 | Fill #0

## 2017-09-10 MED FILL — LEVOTHYROXINE 88 MCG TABLET: 88 | 30 days supply | Qty: 30 | Fill #1

## 2017-09-15 DIAGNOSIS — F3181 Bipolar II disorder: Secondary | ICD-10-CM | POA: Diagnosis not present

## 2017-09-15 DIAGNOSIS — F408 Other phobic anxiety disorders: Secondary | ICD-10-CM | POA: Diagnosis not present

## 2017-09-21 MED FILL — ESOMEPRAZOLE MAG DR 40 MG C: 40 | 30 days supply | Qty: 60 | Fill #1

## 2017-09-21 MED FILL — QNASL 80 MCG NASAL SPRAY: 80 | 30 days supply | Qty: 9 | Fill #2

## 2017-09-21 MED FILL — PROAIR HFA 90 MCG INHALER: 108 (90 BAS | 25 days supply | Qty: 9 | Fill #1

## 2017-09-22 ENCOUNTER — Ambulatory Visit: Payer: 59 | Admitting: Allergy and Immunology

## 2017-09-28 ENCOUNTER — Other Ambulatory Visit (HOSPITAL_COMMUNITY): Payer: Self-pay | Admitting: Obstetrics and Gynecology

## 2017-09-28 DIAGNOSIS — Z363 Encounter for antenatal screening for malformations: Secondary | ICD-10-CM | POA: Diagnosis not present

## 2017-09-28 DIAGNOSIS — Z3689 Encounter for other specified antenatal screening: Secondary | ICD-10-CM

## 2017-09-28 DIAGNOSIS — O99212 Obesity complicating pregnancy, second trimester: Secondary | ICD-10-CM

## 2017-09-28 DIAGNOSIS — Z3A22 22 weeks gestation of pregnancy: Secondary | ICD-10-CM

## 2017-09-30 DIAGNOSIS — R55 Syncope and collapse: Secondary | ICD-10-CM | POA: Diagnosis not present

## 2017-09-30 DIAGNOSIS — E039 Hypothyroidism, unspecified: Secondary | ICD-10-CM | POA: Diagnosis not present

## 2017-09-30 DIAGNOSIS — Z131 Encounter for screening for diabetes mellitus: Secondary | ICD-10-CM | POA: Diagnosis not present

## 2017-09-30 DIAGNOSIS — R002 Palpitations: Secondary | ICD-10-CM | POA: Diagnosis not present

## 2017-10-01 ENCOUNTER — Encounter (HOSPITAL_COMMUNITY): Payer: Self-pay

## 2017-10-01 ENCOUNTER — Ambulatory Visit (HOSPITAL_COMMUNITY): Payer: 59 | Attending: Obstetrics and Gynecology

## 2017-10-05 ENCOUNTER — Encounter (HOSPITAL_COMMUNITY): Payer: Self-pay

## 2017-10-06 ENCOUNTER — Encounter (HOSPITAL_COMMUNITY): Payer: Self-pay

## 2017-10-06 ENCOUNTER — Other Ambulatory Visit (HOSPITAL_COMMUNITY): Payer: Self-pay | Admitting: Obstetrics and Gynecology

## 2017-10-06 ENCOUNTER — Ambulatory Visit (HOSPITAL_COMMUNITY)
Admission: RE | Admit: 2017-10-06 | Discharge: 2017-10-06 | Disposition: A | Discharge status: Home / Self Care | Payer: 59 | Source: Ambulatory Visit | Attending: Obstetrics and Gynecology | Admitting: Obstetrics and Gynecology

## 2017-10-06 DIAGNOSIS — O24419 Gestational diabetes mellitus in pregnancy, unspecified control: Secondary | ICD-10-CM

## 2017-10-06 DIAGNOSIS — Z363 Encounter for antenatal screening for malformations: Secondary | ICD-10-CM | POA: Diagnosis not present

## 2017-10-06 DIAGNOSIS — O24415 Gestational diabetes mellitus in pregnancy, controlled by oral hypoglycemic drugs: Secondary | ICD-10-CM | POA: Diagnosis not present

## 2017-10-06 DIAGNOSIS — O99212 Obesity complicating pregnancy, second trimester: Secondary | ICD-10-CM

## 2017-10-06 DIAGNOSIS — Z3689 Encounter for other specified antenatal screening: Secondary | ICD-10-CM | POA: Diagnosis not present

## 2017-10-06 DIAGNOSIS — Z3A22 22 weeks gestation of pregnancy: Secondary | ICD-10-CM | POA: Diagnosis not present

## 2017-10-06 DIAGNOSIS — O99810 Abnormal glucose complicating pregnancy: Secondary | ICD-10-CM | POA: Diagnosis not present

## 2017-10-06 HISTORY — DX: Hypothyroidism, unspecified: E03.9

## 2017-10-07 ENCOUNTER — Other Ambulatory Visit (HOSPITAL_COMMUNITY): Payer: Self-pay | Admitting: *Deleted

## 2017-10-07 DIAGNOSIS — Z362 Encounter for other antenatal screening follow-up: Secondary | ICD-10-CM

## 2017-10-08 ENCOUNTER — Encounter: Payer: Self-pay | Admitting: Internal Medicine

## 2017-10-12 ENCOUNTER — Other Ambulatory Visit: Payer: Self-pay | Admitting: *Deleted

## 2017-10-12 DIAGNOSIS — O24419 Gestational diabetes mellitus in pregnancy, unspecified control: Secondary | ICD-10-CM | POA: Insufficient documentation

## 2017-10-12 MED FILL — LEVOTHYROXINE 88 MCG TABLET: 88 | 30 days supply | Qty: 30 | Fill #2

## 2017-10-12 MED FILL — lamoTRIgine 100 MG TABS: 100 | 30 days supply | Qty: 30 | Fill #1

## 2017-10-12 MED FILL — FREESTYLE LANCETS: 25 days supply | Qty: 100 | Fill #0

## 2017-10-12 MED FILL — MONTELUKAST SOD 10 MG TAB: 10 | 30 days supply | Qty: 30 | Fill #1

## 2017-10-12 MED FILL — SYMBICORT 160-4.5 MCG INH: 160-4.5 | 30 days supply | Qty: 10 | Fill #1

## 2017-10-12 MED FILL — FREESTYLE LITE METER: 30 days supply | Qty: 1 | Fill #0

## 2017-10-12 MED FILL — ONDANSETRON HCL 4 MG TABLET: 4 | 10 days supply | Qty: 30 | Fill #1

## 2017-10-12 MED FILL — FREESTYLE LITE TEST STRIP: 25 days supply | Qty: 100 | Fill #0

## 2017-10-13 NOTE — Patient Outreach (Addendum)
[facility]) Care Management  10/12/2017  Linda Becker 05-03-1991 [id number]   Objective: New diagnosis of gestation diabetes Subjective: Linda called the [place] Management office number to report she has been diagnosed with elevated blood sugar and she Is pregnant and wishers to enroll in the [place] [facility] Gestational Diabetes program for [facility] medical plan members. . She says that she has been using her Linda Becker's testing supplies and that she is out of strips. She says she was started on Glimepiride by her obstetrician and she woke up with a blood sugar of 40 so she was told to stop taking the medications . She reports her fasting blood sugar this morning at 170. She says both her Linda Becker and Linda Becker have Type II Diabetes and they are both on insulin. She says her due date is August 26th.  [facility] CM Care Plan Problem One     Most Recent Value  Care Plan Problem One Knowledge deficit related to new diagnosis of gestational diabetes  Role Documenting the Problem One Care Management Coordinator  Care Plan for Problem One Active  [facility] Long Term Goal  In the next 90 days patient will demonstrate good understanding of treatment of gestational diabetes as evidenced by: self-monitoring of blood sugars 4 times daily or as prescribed with 90% of blood sugars meeting target, adherence to Plate Method or carb controlled meal plan, completion of assigned [facility] gestational diabetes modules, attending gestational diabetes class, adherence to provider appointments, adherence in contacting this RNCM at least monthly per program guidelines, delivery of healthy baby with no pre or post-delivery maternal or fetal complications related to gestational diabetes   [facility] Long Term Goal Start Date  10/12/17  Interventions for Problem One Long Term Goal Reviewed [place] [facility] Gestational Diabetes Program guidelines and benefits,  provided information packet with explanation of contents, ensured patient has been referred to the [place] gestational diabetes class, instructed patient on how to obtain testing supplies at no cost, and to ask pharmacist for instructions on glucometer use if needed, reviewed the American Diabetes Association recommendations related to frequency  of glucose testing and targets, defined hypoglycemia, symptoms and  reviewed rule of 15s for treating hypoglycemia,  reviewed strategies to treat elevated glucose, reviewed  plate method and/or basic carbohydrate counting, discussed effect of stress on blood sugar and reviewed coping strategies, reinforced the importance of keeping provider appointments and attending class(es), encouraged patient to contact this RNCM for questions or concerns related to gestational diabetes self-management.     Plan:   Enrolled Linda in the gestational diabetes program for self management assistance.  This RNCM will send today's note to patient's obstetrical provider. This RNCM will ensure at least monthly contact with patient until she delivers to assist with gestational diabetes  self management and assess patient's progress toward mutually set goals.  Barrington Ellison RN,CCM,CDE [place] Management Coordinator Office Phone [phone] Office Fax [phone]

## 2017-10-22 ENCOUNTER — Other Ambulatory Visit: Payer: Self-pay | Admitting: Allergy and Immunology

## 2017-10-22 MED FILL — PROAIR HFA 90 MCG INHALER: 108 (90 BAS | 25 days supply | Qty: 9 | Fill #0

## 2017-10-22 MED FILL — ESOMEPRAZOLE MAG DR 40 MG C: 40 | 30 days supply | Qty: 60 | Fill #0

## 2017-10-23 ENCOUNTER — Encounter: Payer: Self-pay | Admitting: Allergy and Immunology

## 2017-10-26 MED FILL — BUTALB-ACETAMIN-CAFF 50-300: 50-300-40 | 5 days supply | Qty: 20 | Fill #0

## 2017-10-27 ENCOUNTER — Other Ambulatory Visit (INDEPENDENT_AMBULATORY_CARE_PROVIDER_SITE_OTHER): Payer: 59

## 2017-10-27 DIAGNOSIS — E039 Hypothyroidism, unspecified: Secondary | ICD-10-CM

## 2017-10-27 LAB — T4, FREE: FREE T4: 0.65 ng/dL (ref 0.60–1.60)

## 2017-10-27 LAB — TSH: TSH: 1.77 u[IU]/mL (ref 0.35–4.50)

## 2017-10-30 ENCOUNTER — Ambulatory Visit (INDEPENDENT_AMBULATORY_CARE_PROVIDER_SITE_OTHER): Payer: 59 | Admitting: Endocrinology

## 2017-10-30 ENCOUNTER — Encounter: Payer: Self-pay | Admitting: Endocrinology

## 2017-10-30 VITALS — BP 126/82 | HR 100 | Ht 66.0 in | Wt 328.4 lb

## 2017-10-30 DIAGNOSIS — E039 Hypothyroidism, unspecified: Secondary | ICD-10-CM

## 2017-10-30 NOTE — Progress Notes (Signed)
Patient ID: Linda Becker, female   DOB: 07-20-1990, 27 y.o.   MRN: [id number]             Reason for Appointment:  Hypothyroidism, follow-up visit    History of Present Illness:    Hypothyroidism was first diagnosed in 10/2016  At the time of diagnosis patient was having symptoms of  fatigue, cold sensitivity, weight gain, brittle nails and hair loss .          However her symptoms had been present for several years and not any worse She was tested with her TSH because of needing to start lithium therapy Baseline TSH was 6.7, no baseline free T4 or free T3 available  Prior to consultation she had been treated with  liothyronine, initially 25 g daily and then 50 g by her psychiatrist for treatment of her high TSH The patient did not subjectively feeling any better with taking the liothyronine; with this she was having palpitations, shortness of breath and feeling excessively warm and sweaty Because of her symptoms she was told to stop the liothyronine and have baseline labs done again  RECENT history:  She had been observed without any supplementation prior to her pregnancy and she was only mildly symptomatic Her highest TSH recently has been 5.2  Because of her early pregnancy her gynecologist recommended levothyroxine supplementation  Last visit in 2/19 her TSH was 3.4 She was having nonspecific symptoms of headaches, alternating heat and cold intolerance and morning sickness at that time  Because of her advancing pregnancy her dose was increased up to 88 mcg She is thinks that she is occasionally having palpitations but this is mostly in the morning when she wakes up or with increased activity Has other symptoms such as headaches and sweating at times  Currently her TSH is improved at 1.77 with relatively low free T4  She is taking her levothyroxine consistently before breakfast         Patient's weight history is as follows:  Wt Readings from Last 3 Encounters:   10/30/17 (!) 328 lb 6.4 oz (149 kg)  10/06/17 (!) 317 lb 8 oz (144 kg)  08/06/17 (!) 311 lb 9.6 oz (141.3 kg)    TPO antibody is negative  Thyroid function results have been as follows:  Lab Results  Component Value Date   TSH 1.77 10/27/2017   TSH 3.40 08/04/2017   TSH 5.23 (H) 06/29/2017   TSH 5.97 (A) 02/06/2017   FREET4 0.65 10/27/2017   FREET4 0.91 06/29/2017   FREET4 0.93 09/05/2015   T3FREE 3.5 06/29/2017    02/05/17: Free thyroxine index low at 1.0   Past Medical History:  Diagnosis Date  . Allergic rhinitis    remote hx of allergy shots  . Asthma    since childhood  on controller meds extrinsic dr Carmelina Peal  . GERD (gastroesophageal reflux disease)    on nexium  for  long term sx since childhood  . H/O miscarriage, not currently pregnant    [redacted] weeks  march 2016  . Hypothyroidism   . Migraine   . Murmur    pt reports MVP  . Syncope    under eval ? cause  . Tachycardia    episodes with near syncope eval dr Nadyne Coombes  on no meds dced LABA    Past Surgical History:  Procedure Laterality Date  . broken right femur  2008   rod placed  . DILATION AND CURETTAGE OF UTERUS    . TONSILLECTOMY  2006  . WISDOM TOOTH EXTRACTION      Family History  Problem Relation Age of Onset  . Asthma Father   . Arthritis Father   . Hyperlipidemia Father   . Heart disease Father   . Hypertension Father   . Diabetes Father   . Arthritis Mother   . Hyperlipidemia Mother   . Heart disease Mother   . Hypertension Mother   . Diabetes Mother   . Arthritis Maternal Grandmother   . Breast cancer Maternal Grandmother   . Thyroid disease Maternal Grandmother   . Colon cancer Paternal Grandfather   . Diabetes Paternal Grandfather   . Breast cancer Paternal Grandmother   . Diabetes Paternal Grandmother   . Breast cancer Paternal Aunt   . Breast cancer Paternal Aunt   . Diabetes Maternal Grandfather     Social History:  reports that she has never smoked. She has never used  smokeless tobacco. She reports that she does not drink alcohol or use drugs.  Allergies:  Allergies  Allergen Reactions  . Progesterone Rash    Was in a form of birth control.    Allergies as of 10/30/2017      Reactions   Progesterone Rash   Was in a form of birth control.      Medication List        Accurate as of 10/30/17  9:27 AM. Always use your most recent med list.          albuterol (2.5 MG/3ML) 0.083% nebulizer solution Commonly known as:  PROVENTIL Inhale the contents of one vial in nebulizer every four to six hours as needed for cough or wheeze.   PROAIR HFA 108 (90 Base) MCG/ACT inhaler Generic drug:  albuterol INHALE 2 PUFFS BY MOUTH EVERY 6 HOURS AS NEEDED FOR WHEEZING OR SHORTNESS OF BREATH.   ALL DAY ALLERGY 10 MG tablet Generic drug:  cetirizine TAKE 1 TABLET (10 MG TOTAL) BY MOUTH DAILY.   baclofen 10 MG tablet Commonly known as:  LIORESAL TAKE ONE TABLET BY MOUTH TWICE DAILY LIMIT 2 DAYS PER WEEK   Beclomethasone Dipropionate 80 MCG/ACT Aers Commonly known as:  QNASL Use one to two puffs in each nostril once daily as directed.   esomeprazole 40 MG capsule Commonly known as:  NEXIUM TAKE ONE CAPSULE TWICE DAILY AS DIRECTED   lamoTRIgine 100 MG tablet Commonly known as:  LAMICTAL Take 100 mg by mouth daily.   levothyroxine 88 MCG tablet Commonly known as:  SYNTHROID, LEVOTHROID Take 1 tablet (88 mcg total) by mouth daily.   montelukast 10 MG tablet Commonly known as:  SINGULAIR Take 1 tablet (10 mg total) by mouth daily.   ondansetron 4 MG tablet Commonly known as:  ZOFRAN   PATADAY 0.2 % Soln Generic drug:  Olopatadine HCl Apply 1 drop to eye daily as needed.   PRE-NATAL PO Take by mouth daily.   PROBIOTIC PO Take by mouth daily.   promethazine 25 MG tablet Commonly known as:  PHENERGAN Take 1 tablet (25 mg total) by mouth every 6 (six) hours as needed for nausea or vomiting (headache).   SYMBICORT 160-4.5 MCG/ACT  inhaler Generic drug:  budesonide-formoterol Inhale 2 puffs twice a day to prevent cough or wheeze.  Rinse, gargle, and spit after use.   Vitamin D3 1000 UNIT/SPRAY Liqd Take by mouth at bedtime.          Review of Systems   She says she has been tested for gestational diabetes but  this has not been confirmed with either a 3-hour glucose tolerance test or A1c, the latter is below 5 however she thinks her blood sugars that are being checked at home range between 50 and 200 Has not had a consultation with dietitian She is going to get a 1 hour glucose tolerance test next week with her gynecologist        Examination:    BP 126/82 (BP Location: Left Arm, Patient Position: Sitting, Cuff Size: Large)   Pulse 100   Ht 5' 6"  (1.676 m)   Wt (!) 328 lb 6.4 oz (149 kg)   LMP 05/05/2017   SpO2 96%   BMI 53.01 kg/m   She looks well    Assessment:  HYPOTHYROIDISM, MILD primary without a goiter  Currently the patient is taking 88 mg of levothyroxine, the dose was increased on her last visit  She is having nonspecific physical symptoms which are unrelated such as headaches and occasional sweating, having difficulty sleeping well and having fatigue because of her pregnancy also TSH is quite normal at 1.77  ?  Gestational diabetes: Not clear what her picture is with reportedly high blood sugars after meals but normal A1c is going to follow-up with her gynecologist next week   PLAN:   She will continue 88 mcg of levothyroxine  Discussed that if she is having high readings after meals she needs to see a dietitian first and she can discuss this with her gynecologist Also she ask her gynecologist if they want to request a diabetes consultation here  Follow-up in 7 weeks again   Elayne Snare 10/30/2017, 9:27 AM    Note: This office note was prepared with Dragon voice recognition system technology. Any transcriptional errors that result from this process are unintentional.

## 2017-11-02 ENCOUNTER — Encounter (HOSPITAL_COMMUNITY): Payer: Self-pay | Admitting: *Deleted

## 2017-11-02 ENCOUNTER — Inpatient Hospital Stay (HOSPITAL_COMMUNITY)
Admission: AD | Admit: 2017-11-02 | Discharge: 2017-11-02 | Disposition: A | Discharge status: Home / Self Care | Payer: 59 | Source: Ambulatory Visit | Attending: Obstetrics and Gynecology | Admitting: Obstetrics and Gynecology

## 2017-11-02 DIAGNOSIS — N898 Other specified noninflammatory disorders of vagina: Secondary | ICD-10-CM

## 2017-11-02 DIAGNOSIS — B3731 Acute candidiasis of vulva and vagina: Secondary | ICD-10-CM

## 2017-11-02 DIAGNOSIS — Z3A26 26 weeks gestation of pregnancy: Secondary | ICD-10-CM | POA: Diagnosis not present

## 2017-11-02 DIAGNOSIS — O98812 Other maternal infectious and parasitic diseases complicating pregnancy, second trimester: Secondary | ICD-10-CM

## 2017-11-02 DIAGNOSIS — B373 Candidiasis of vulva and vagina: Secondary | ICD-10-CM

## 2017-11-02 DIAGNOSIS — O26892 Other specified pregnancy related conditions, second trimester: Secondary | ICD-10-CM

## 2017-11-02 DIAGNOSIS — O4290 Premature rupture of membranes, unspecified as to length of time between rupture and onset of labor, unspecified weeks of gestation: Secondary | ICD-10-CM | POA: Diagnosis present

## 2017-11-02 LAB — AMNISURE RUPTURE OF MEMBRANE (ROM) NOT AT ARMC: AMNISURE: NEGATIVE

## 2017-11-02 LAB — WET PREP, GENITAL
Clue Cells Wet Prep HPF POC: NONE SEEN
SPERM: NONE SEEN
TRICH WET PREP: NONE SEEN
Yeast Wet Prep HPF POC: NONE SEEN

## 2017-11-02 MED ORDER — TERCONAZOLE 0.4 % VA CREA: 1.0000 | TOPICAL_CREAM | Freq: Every day | VAGINAL | 0 refills | Status: DC

## 2017-11-02 MED FILL — TERCONAZOLE 0.4% VAG CREAM: 0.4 | 7 days supply | Qty: 45 | Fill #0

## 2017-11-02 NOTE — MAU Note (Signed)
Urine sent to lab 

## 2017-11-02 NOTE — MAU Provider Note (Signed)
S: [name] is a 27 y.o. G2P0010 at [redacted]w[redacted]d who presents to MAU today complaining of leaking of fluid since last week, and vaginal irritation.   She denies vaginal bleeding. She denies contractions. She reports normal fetal movement. + itching with irritation in her vaginal area.   Has not tried anything over the counter for the symptoms. Has never had these symptoms before pregnancy.    O: BP 116/82 (BP Location: Right Arm)   Pulse (!) 105   Temp 98.5 F (36.9 C) (Oral)   Resp 20   Ht 5' 6"  (1.676 m)   Wt (!) 328 lb (148.8 kg)   LMP 05/05/2017   SpO2 100%   BMI 52.94 kg/m  GENERAL: Well-developed, well-nourished female in no acute distress.  HEAD: Normocephalic, atraumatic.  CHEST: Normal effort of breathing, regular heart rate ABDOMEN: Soft, nontender, gravid PELVIC: External vagina area with erythema and excoriation. Cervix appears closed, with large amount of thick, curd like white discharge, no lesions.  Negative pooling.   Cervical exam:  Deferred due to patients discomfort in the vaginal area.  Fetal Monitoring: Baseline: 145 bpm Variability: moderate Accelerations: 10x10 Decelerations: None Contractions: None  No results found for this or any previous visit (from the past 24 hour(s)).  Negative fern slide. Discussed patient with Dr. CRogue Bussing discussed exam,NST,  labs and plan of care. [place]   A: SIUP at 219w0dMembranes intact.  Vaginal yeast infection   P:  Discharge home in stable condition Rx: Terazol cream Return to MAU if symptoms worsen Follow up with with OBGYN as scheduled or sooner if needed   RaNoni Saupe, NP 11/04/2017 1:12 PM

## 2017-11-02 NOTE — MAU Note (Signed)
Pt reports she has been leaking fluid for a week, increased since last pm. Also reports cramping off/on

## 2017-11-02 NOTE — Discharge Instructions (Signed)

## 2017-11-04 ENCOUNTER — Other Ambulatory Visit: Payer: Self-pay | Admitting: Allergy and Immunology

## 2017-11-04 DIAGNOSIS — F3181 Bipolar II disorder: Secondary | ICD-10-CM | POA: Diagnosis not present

## 2017-11-04 DIAGNOSIS — F408 Other phobic anxiety disorders: Secondary | ICD-10-CM | POA: Diagnosis not present

## 2017-11-05 MED FILL — LEVOTHYROXINE 88 MCG TABLET: 88 | 30 days supply | Qty: 30 | Fill #3

## 2017-11-05 MED FILL — MONTELUKAST SOD 10 MG TAB: 10 | 30 days supply | Qty: 30 | Fill #2

## 2017-11-10 ENCOUNTER — Ambulatory Visit (INDEPENDENT_AMBULATORY_CARE_PROVIDER_SITE_OTHER): Payer: 59 | Admitting: Internal Medicine

## 2017-11-10 ENCOUNTER — Encounter: Payer: Self-pay | Admitting: Internal Medicine

## 2017-11-10 VITALS — BP 124/74 | HR 120 | Temp 98.2°F | Ht 66.0 in | Wt 329.6 lb

## 2017-11-10 DIAGNOSIS — Z0000 Encounter for general adult medical examination without abnormal findings: Secondary | ICD-10-CM

## 2017-11-10 DIAGNOSIS — Z820 Family history of epilepsy and other diseases of the nervous system: Secondary | ICD-10-CM

## 2017-11-10 DIAGNOSIS — F3181 Bipolar II disorder: Secondary | ICD-10-CM | POA: Diagnosis not present

## 2017-11-10 DIAGNOSIS — Z3A28 28 weeks gestation of pregnancy: Secondary | ICD-10-CM

## 2017-11-10 DIAGNOSIS — R0683 Snoring: Secondary | ICD-10-CM | POA: Insufficient documentation

## 2017-11-10 NOTE — Patient Instructions (Addendum)
We are going to get a pulmonary consults about the poss of osa. .  As discussed .    Disc with your obgyne when time to have an updated tdap  And for family members.     After delivery and  Breast feeding consideration of   Medical obesity weight management referral .  Check back with [place] at that time.      Health Maintenance, Female Adopting a healthy lifestyle and getting preventive care can go a long way to promote health and wellness. Talk with your health care provider about what schedule of regular examinations is right for you. This is a good chance for you to check in with your provider about disease prevention and staying healthy. In between checkups, there are plenty of things you can do on your own. Experts have done a lot of research about which lifestyle changes and preventive measures are most likely to keep you healthy. Ask your health care provider for more information. Weight and diet Eat a healthy diet  Be sure to include plenty of vegetables, fruits, low-fat dairy products, and lean protein.  Do not eat a lot of foods high in solid fats, added sugars, or salt.  Get regular exercise. This is one of the most important things you can do for your health. ? Most adults should exercise for at least 150 minutes each week. The exercise should increase your heart rate and make you sweat (moderate-intensity exercise). ? Most adults should also do strengthening exercises at least twice a week. This is in addition to the moderate-intensity exercise.  Maintain a healthy weight  Body mass index (BMI) is a measurement that can be used to identify possible weight problems. It estimates body fat based on height and weight. Your health care provider can help determine your BMI and help you achieve or maintain a healthy weight.  For females 15 years of age and older: ? A BMI below 18.5 is considered underweight. ? A BMI of 18.5 to 24.9 is normal. ? A BMI of 25 to 29.9 is considered  overweight. ? A BMI of 30 and above is considered obese.  Watch levels of cholesterol and blood lipids  You should start having your blood tested for lipids and cholesterol at 25 years of age, then have this test every 5 years.  You may need to have your cholesterol levels checked more often if: ? Your lipid or cholesterol levels are high. ? You are older than 25 years of age. ? You are at high risk for heart disease.  Cancer screening Lung Cancer  Lung cancer screening is recommended for adults 14-72 years old who are at high risk for lung cancer because of a history of smoking.  A yearly low-dose CT scan of the lungs is recommended for people who: ? Currently smoke. ? Have quit within the past 15 years. ? Have at least a 30-pack-year history of smoking. A pack year is smoking an average of one pack of cigarettes a day for 1 year.  Yearly screening should continue until it has been 15 years since you quit.  Yearly screening should stop if you develop a health problem that would prevent you from having lung cancer treatment.  Breast Cancer  Practice breast self-awareness. This means understanding how your breasts normally appear and feel.  It also means doing regular breast self-exams. Let your health care provider know about any changes, no matter how small.  If you are in your 20s or 30s,  you should have a clinical breast exam (CBE) by a health care provider every 1-3 years as part of a regular health exam.  If you are 29 or older, have a CBE every year. Also consider having a breast X-ray (mammogram) every year.  If you have a family history of breast cancer, talk to your health care provider about genetic screening.  If you are at high risk for breast cancer, talk to your health care provider about having an MRI and a mammogram every year.  Breast cancer gene (BRCA) assessment is recommended for women who have family members with BRCA-related cancers. BRCA-related cancers  include: ? Breast. ? Ovarian. ? Tubal. ? Peritoneal cancers.  Results of the assessment will determine the need for genetic counseling and BRCA1 and BRCA2 testing.  Cervical Cancer Your health care provider may recommend that you be screened regularly for cancer of the pelvic organs (ovaries, uterus, and vagina). This screening involves a pelvic examination, including checking for microscopic changes to the surface of your cervix (Pap test). You may be encouraged to have this screening done every 3 years, beginning at age 2.  For women ages 18-65, health care providers may recommend pelvic exams and Pap testing every 3 years, or they may recommend the Pap and pelvic exam, combined with testing for human papilloma virus (HPV), every 5 years. Some types of HPV increase your risk of cervical cancer. Testing for HPV may also be done on women of any age with unclear Pap test results.  Other health care providers may not recommend any screening for nonpregnant women who are considered low risk for pelvic cancer and who do not have symptoms. Ask your health care provider if a screening pelvic exam is right for you.  If you have had past treatment for cervical cancer or a condition that could lead to cancer, you need Pap tests and screening for cancer for at least 20 years after your treatment. If Pap tests have been discontinued, your risk factors (such as having a new sexual partner) need to be reassessed to determine if screening should resume. Some women have medical problems that increase the chance of getting cervical cancer. In these cases, your health care provider may recommend more frequent screening and Pap tests.  Colorectal Cancer  This type of cancer can be detected and often prevented.  Routine colorectal cancer screening usually begins at 25 years of age and continues through 25 years of age.  Your health care provider may recommend screening at an earlier age if you have risk factors  for colon cancer.  Your health care provider may also recommend using home test kits to check for hidden blood in the stool.  A small camera at the end of a tube can be used to examine your colon directly (sigmoidoscopy or colonoscopy). This is done to check for the earliest forms of colorectal cancer.  Routine screening usually begins at age 47.  Direct examination of the colon should be repeated every 5-10 years through 25 years of age. However, you may need to be screened more often if early forms of precancerous polyps or small growths are found.  Skin Cancer  Check your skin from head to toe regularly.  Tell your health care provider about any new moles or changes in moles, especially if there is a change in a mole's shape or color.  Also tell your health care provider if you have a mole that is larger than the size of a pencil eraser.  Always use sunscreen. Apply sunscreen liberally and repeatedly throughout the day.  Protect yourself by wearing long sleeves, pants, a wide-brimmed hat, and sunglasses whenever you are outside.  Heart disease, diabetes, and high blood pressure  High blood pressure causes heart disease and increases the risk of stroke. High blood pressure is more likely to develop in: ? People who have blood pressure in the high end of the normal range (130-139/85-89 mm Hg). ? People who are overweight or obese. ? People who are African American.  If you are 54-75 years of age, have your blood pressure checked every 3-5 years. If you are 36 years of age or older, have your blood pressure checked every year. You should have your blood pressure measured twice-once when you are at a hospital or clinic, and once when you are not at a hospital or clinic. Record the average of the two measurements. To check your blood pressure when you are not at a hospital or clinic, you can use: ? An automated blood pressure machine at a pharmacy. ? A home blood pressure monitor.  If  you are between 36 years and 38 years old, ask your health care provider if you should take aspirin to prevent strokes.  Have regular diabetes screenings. This involves taking a blood sample to check your fasting blood sugar level. ? If you are at a normal weight and have a low risk for diabetes, have this test once every three years after 25 years of age. ? If you are overweight and have a high risk for diabetes, consider being tested at a younger age or more often. Preventing infection Hepatitis B  If you have a higher risk for hepatitis B, you should be screened for this virus. You are considered at high risk for hepatitis B if: ? You were born in a country where hepatitis B is common. Ask your health care provider which countries are considered high risk. ? Your parents were born in a high-risk country, and you have not been immunized against hepatitis B (hepatitis B vaccine). ? You have HIV or AIDS. ? You use needles to inject street drugs. ? You live with someone who has hepatitis B. ? You have had sex with someone who has hepatitis B. ? You get hemodialysis treatment. ? You take certain medicines for conditions, including cancer, organ transplantation, and autoimmune conditions.  Hepatitis C  Blood testing is recommended for: ? Everyone born from [birth year] through 1963. ? Anyone with known risk factors for hepatitis C.  Sexually transmitted infections (STIs)  You should be screened for sexually transmitted infections (STIs) including gonorrhea and chlamydia if: ? You are sexually active and are younger than 25 years of age. ? You are older than 25 years of age and your health care provider tells you that you are at risk for this type of infection. ? Your sexual activity has changed since you were last screened and you are at an increased risk for chlamydia or gonorrhea. Ask your health care provider if you are at risk.  If you do not have HIV, but are at risk, it may be recommended  that you take a prescription medicine daily to prevent HIV infection. This is called pre-exposure prophylaxis (PrEP). You are considered at risk if: ? You are sexually active and do not regularly use condoms or know the HIV status of your partner(s). ? You take drugs by injection. ? You are sexually active with a partner who has HIV.  Talk with  your health care provider about whether you are at high risk of being infected with HIV. If you choose to begin PrEP, you should first be tested for HIV. You should then be tested every 3 months for as long as you are taking PrEP. Pregnancy  If you are premenopausal and you may become pregnant, ask your health care provider about preconception counseling.  If you may become pregnant, take 400 to 800 micrograms (mcg) of folic acid every day.  If you want to prevent pregnancy, talk to your health care provider about birth control (contraception). Osteoporosis and menopause  Osteoporosis is a disease in which the bones lose minerals and strength with aging. This can result in serious bone fractures. Your risk for osteoporosis can be identified using a bone density scan.  If you are 92 years of age or older, or if you are at risk for osteoporosis and fractures, ask your health care provider if you should be screened.  Ask your health care provider whether you should take a calcium or vitamin D supplement to lower your risk for osteoporosis.  Menopause may have certain physical symptoms and risks.  Hormone replacement therapy may reduce some of these symptoms and risks. Talk to your health care provider about whether hormone replacement therapy is right for you. Follow these instructions at home:  Schedule regular health, dental, and eye exams.  Stay current with your immunizations.  Do not use any tobacco products including cigarettes, chewing tobacco, or electronic cigarettes.  If you are pregnant, do not drink alcohol.  If you are  breastfeeding, limit how much and how often you drink alcohol.  Limit alcohol intake to no more than 1 drink per day for nonpregnant women. One drink equals 12 ounces of beer, 5 ounces of wine, or 1 ounces of hard liquor.  Do not use street drugs.  Do not share needles.  Ask your health care provider for help if you need support or information about quitting drugs.  Tell your health care provider if you often feel depressed.  Tell your health care provider if you have ever been abused or do not feel safe at home. This information is not intended to replace advice given to you by your health care provider. Make sure you discuss any questions you have with your health care provider. Document Released: 12/16/2010 Document Revised: 11/08/2015 Document Reviewed: 03/06/2015 [employer] Interactive Patient Education  [employer].

## 2017-11-10 NOTE — Progress Notes (Signed)
Chief Complaint  Patient presents with  . Annual Exam    Pt is [redacted] weeks pregnant. Pt concerned about weight gain, but states that this can be discussed after delivery - Cards recommended weight loss meds post delivery.     HPI: Patient  Linda Becker  27 y.o. comes in today for Preventive Health Care visit  Needed for insurance   She is 28 weeks     Under care for hypothyroid  Replaced and  Checking for gest dm . Plans   on working until delivery .   2 vessel   Cord    Med team wants med weight  Intervention when possible .   She wants to  Breast feed first.    Asthma ? Stable using inhaler a good bit  Sees dr Carmelina Peal     Health Maintenance  Topic Date Due  . HIV Screening  11/10/2007  . TETANUS/TDAP  11/10/2011  . INFLUENZA VACCINE  01/14/2018  . 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 If stopped breathing  Mood stable  GEN/ HEENT: No fever, significant weight changes sweats headaches vision problems hearing changes, CV/ PULM; No chest pain shortness of breath cough, syncope,some  Dependent edema  When up and around  Some  change in exercise tolerance. From preg but no cp  GI /GU: No adominal pain, vomiting, change in bowel habits. No blood in the stool. No significant GU symptoms. SKIN/HEME: ,no acute skin rashes suspicious lesions or bleeding. No lymphadenopathy, nodules, masses.  NEURO/ PSYCH:  No neurologic signs such as weakness numbness.  IMM/ Allergy: No unusual infections.  Allergy .   REST of 12 system review negative except as per HPI   Past Medical History:  Diagnosis Date  . Allergic rhinitis    remote hx of allergy shots  . Asthma     since childhood  on controller meds extrinsic dr Carmelina Peal  . GERD (gastroesophageal reflux disease)    on nexium  for  long term sx since childhood  . H/O miscarriage, not currently pregnant    [redacted] weeks  march 2016  . Hypothyroidism   . Migraine   . Murmur    pt reports MVP  . Syncope    under eval ? cause  . Tachycardia    episodes with near syncope eval dr Nadyne Coombes  on no meds dced LABA    Past Surgical History:  Procedure Laterality Date  . broken right femur  2008   rod placed  . DILATION AND CURETTAGE OF UTERUS    . TONSILLECTOMY  2006  . WISDOM TOOTH EXTRACTION      Family History  Problem Relation Age of Onset  . Asthma Father   . Arthritis Father   . Hyperlipidemia Father   . Heart disease Father   . Hypertension Father   . Diabetes Father   . Arthritis Mother   . Hyperlipidemia Mother   . Heart disease Mother   . Hypertension Mother   . Diabetes Mother   .  Arthritis Maternal Grandmother   . Breast cancer Maternal Grandmother   . Thyroid disease Maternal Grandmother   . Colon cancer Paternal Grandfather   . Diabetes Paternal Grandfather   . Breast cancer Paternal Grandmother   . Diabetes Paternal Grandmother   . Breast cancer Paternal Aunt   . Breast cancer Paternal Aunt   . Diabetes Maternal Grandfather     Social History   Socioeconomic History  . Marital status: Divorced    Spouse name: Not on file  . Number of children: Not on file  . Years of education: Not on file  . Highest education level: Not on file  Occupational History  . Not on file  Social Needs  . Financial resource strain: Not on file  . Food insecurity:    Worry: Not on file    Inability: Not on file  . Transportation needs:    Medical: Not on file    Non-medical: Not on file  Tobacco Use  . Smoking status: Never Smoker  . Smokeless tobacco: Never Used  Substance and Sexual Activity  . Alcohol use: No    Alcohol/week: 0.0 oz    Frequency: Never  . Drug use: No  . Sexual  activity: Yes    Birth control/protection: None  Lifestyle  . Physical activity:    Days per week: Not on file    Minutes per session: Not on file  . Stress: Not on file  Relationships  . Social connections:    Talks on phone: Not on file    Gets together: Not on file    Attends religious service: Not on file    Active member of club or organization: Not on file    Attends meetings of clubs or organizations: Not on file    Relationship status: Not on file  Other Topics Concern  . 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 albuterol (PROVENTIL) (2.5 MG/3ML) 0.083% nebulizer solution Inhale the contents of one vial in nebulizer every four to six hours as needed for cough or wheeze. 60 vial 1  . ALL DAY ALLERGY 10 MG tablet TAKE 1 TABLET (10 MG TOTAL) BY MOUTH DAILY. 30 tablet 5  . Beclomethasone Dipropionate (QNASL) 80 MCG/ACT AERS Use one to two puffs in each nostril once daily as directed. 1 Inhaler 5  . Cholecalciferol (VITAMIN D3) 1000 UNIT/SPRAY LIQD Take by mouth at bedtime.    Marland Kitchen esomeprazole (NEXIUM) 40 MG capsule TAKE ONE CAPSULE TWICE DAILY AS DIRECTED 60 capsule 0  . lamoTRIgine (LAMICTAL) 100 MG tablet Take 100 mg by mouth daily.    Marland Kitchen levothyroxine (SYNTHROID, LEVOTHROID) 88 MCG tablet Take 1 tablet (88 mcg total) by mouth daily. 30 tablet 3  . montelukast (SINGULAIR) 10 MG tablet Take 1 tablet (10 mg total) by mouth daily. 90 tablet 1  . Olopatadine HCl (PATADAY) 0.2 % SOLN Apply 1 drop to eye daily as needed.     . ondansetron (ZOFRAN) 4 MG tablet     . Prenatal Multivit-Min-Fe-FA (PRE-NATAL PO) Take by mouth daily.    Marland Kitchen PROAIR HFA 108 (90 Base) MCG/ACT inhaler INHALE  2 PUFFS BY MOUTH EVERY 6 HOURS AS  NEEDED FOR WHEEZING OR SHORTNESS OF BREATH. 8.5 g 0  . Probiotic Product (PROBIOTIC PO) Take by mouth daily.    . promethazine (PHENERGAN) 25 MG tablet Take 1 tablet (25 mg total) by mouth every 6 (six) hours as needed for nausea or vomiting (headache). 30 tablet 0  . SYMBICORT 160-4.5 MCG/ACT inhaler Inhale 2 puffs twice a day to prevent cough or wheeze.  Rinse, gargle, and spit after use. 1 Inhaler 5  . terconazole (TERAZOL 7) 0.4 % vaginal cream Place 1 applicator vaginally at bedtime. 45 g 0  . Butalbital-APAP-Caffeine 50-300-40 MG CAPS butalbital-acetaminophen-caffeine 50 mg-300 mg-40 mg capsule  TAKE 1 CAPSULE BY MOUTH EVERY 6 HOURS AS NEEDED (LIMIT TO 2 TO 3 PER WEEK)    . baclofen (LIORESAL) 10 MG tablet TAKE ONE TABLET BY MOUTH TWICE DAILY LIMIT 2 DAYS PER WEEK  0   Facility-Administered Medications Prior to Visit  Medication Dose Route Frequency Provider Last Rate Last Dose  . Benralizumab SOSY 30 mg  30 mg Subcutaneous Q8 Weeks Kozlow, Donnamarie Poag, MD   30 mg at 05/15/17 1213     EXAM:  BP 124/74 (BP Location: Right Arm, Patient Position: Sitting, Cuff Size: Normal)   Pulse (!) 120 Comment: NORMAL HR  Temp 98.2 F (36.8 C) (Oral)   Ht 5' 6"  (1.676 m)   Wt (!) 329 lb 9.6 oz (149.5 kg)   LMP 05/05/2017   BMI 53.20 kg/m   Body mass index is 53.2 kg/m. Wt Readings from Last 3 Encounters:  11/10/17 (!) 329 lb 9.6 oz (149.5 kg)  11/02/17 (!) 328 lb (148.8 kg)  10/30/17 (!) 328 lb 6.4 oz (149 kg)    Physical Exam: Vital signs reviewed [id number] is a well-developed well-nourished alert cooperative    who appearsr stated age in no acute distress.  HEENT: normocephalic atraumatic , Eyes: PERRL EOM's full, conjunctiva clear, Nares: paten,t no deformity discharge or tenderness., Ears: no deformity EAC's clear TMs with normal landmarks. Mouth: clear OP, no lesions, edema.  Moist mucous membranes. Dentition in adequate repair. NECK: supple without masses, thyromegaly or  bruits. CHEST/PULM:  Clear to auscultation and percussion breath sounds equal no wheeze , rales or rhonchi. No chest wall deformities or tenderness. Breast: normal by inspection . No dimpling, discharge, masses, tenderness or discharge . CV: PMI is nondisplaced, S1 S2 no gallops, murmurs, rubs. Peripheral pulses arewithout delay.No JVD .  ABDOMEN: Bowel sounds normal nontender  No guard or rebound, no hepato splenomegal no CVA tenderness.gravid uterus  Extremtities:  No clubbing cyanosis or edema, no acute joint swelling or redness no focal atrophy NEURO:  Oriented x3, cranial nerves 3-12 appear to be intact, no obvious focal weakness,gait within normal limits no abnormal reflexes or asymmetrical SKIN: No acute rashes normal turgor, color, no bruising or petechiae.  tatoo le   No pink striae  PSYCH: Oriented, good eye contact, no obvious depression anxiety, cognition and judgment appear normal. LN: no cervical axillary inguinal adenopathy    BP Readings from Last 3 Encounters:  11/10/17 124/74  11/02/17 116/82  10/30/17 126/82    Blood owrk Ni today under  obgyne care and  Endo care  .    ASSESSMENT AND PLAN:  Discussed the following assessment and plan:  Visit for preventive health examination  Morbid obesity ([place]) - dsic expt managment for  futre intervnetion  after preg etc - Plan: Ambulatory referral to Pulmonology  Snoring - fam  hx osa and  sx cw obstructive sx  referral to pulmonaur poss osa  - Plan: Ambulatory referral to Pulmonology  [redacted] weeks gestation of pregnancy  Bipolar 2 disorder ([place])  Family history of sleep apnea - Plan: Ambulatory referral to Pulmonology Hypothyroid  On replacement    Expectant management.  Patient Care Team: Panosh, Standley Brooking, MD as PCP - General (Internal Medicine) Neldon Mc Donnamarie Poag, MD as Consulting Physician (Allergy and Immunology) Adrian Prows, MD as Consulting Physician (Cardiology) Elayne Snare, MD as Consulting Physician  (Endocrinology) Barrington Ellison, RN as [place] Management Patient Instructions  We are going to get a pulmonary consults about the poss of osa. .  As discussed .    Disc with your obgyne when time to have an updated tdap  And for family members.     After delivery and  Breast feeding consideration of   Medical obesity weight management referral .  Check back with [place] at that time.      Health Maintenance, Female Adopting a healthy lifestyle and getting preventive care can go a long way to promote health and wellness. Talk with your health care provider about what schedule of regular examinations is right for you. This is a good chance for you to check in with your provider about disease prevention and staying healthy. In between checkups, there are plenty of things you can do on your own. Experts have done a lot of research about which lifestyle changes and preventive measures are most likely to keep you healthy. Ask your health care provider for more information. Weight and diet Eat a healthy diet  Be sure to include plenty of vegetables, fruits, low-fat dairy products, and lean protein.  Do not eat a lot of foods high in solid fats, added sugars, or salt.  Get regular exercise. This is one of the most important things you can do for your health. ? Most adults should exercise for at least 150 minutes each week. The exercise should increase your heart rate and make you sweat (moderate-intensity exercise). ? Most adults should also do strengthening exercises at least twice a week. This is in addition to the moderate-intensity exercise.  Maintain a healthy weight  Body mass index (BMI) is a measurement that can be used to identify possible weight problems. It estimates body fat based on height and weight. Your health care provider can help determine your BMI and help you achieve or maintain a healthy weight.  For females 12 years of age and older: ? A BMI below  18.5 is considered underweight. ? A BMI of 18.5 to 24.9 is normal. ? A BMI of 25 to 29.9 is considered overweight. ? A BMI of 30 and above is considered obese.  Watch levels of cholesterol and blood lipids  You should start having your blood tested for lipids and cholesterol at 25 years of age, then have this test every 5 years.  You may need to have your cholesterol levels checked more often if: ? Your lipid or cholesterol levels are high. ? You are older than 25 years of age. ? You are at high risk for heart disease.  Cancer screening Lung Cancer  Lung cancer screening is recommended for adults 56-106 years old who are at high risk for lung cancer because of a history of smoking.  A yearly low-dose CT scan of the lungs is recommended for people who: ? Currently smoke. ? Have quit within the past 15 years. ? Have  at least a 30-pack-year history of smoking. A pack year is smoking an average of one pack of cigarettes a day for 1 year.  Yearly screening should continue until it has been 15 years since you quit.  Yearly screening should stop if you develop a health problem that would prevent you from having lung cancer treatment.  Breast Cancer  Practice breast self-awareness. This means understanding how your breasts normally appear and feel.  It also means doing regular breast self-exams. Let your health care provider know about any changes, no matter how small.  If you are in your 20s or 30s, you should have a clinical breast exam (CBE) by a health care provider every 1-3 years as part of a regular health exam.  If you are 36 or older, have a CBE every year. Also consider having a breast X-ray (mammogram) every year.  If you have a family history of breast cancer, talk to your health care provider about genetic screening.  If you are at high risk for breast cancer, talk to your health care provider about having an MRI and a mammogram every year.  Breast cancer gene (BRCA)  assessment is recommended for women who have family members with BRCA-related cancers. BRCA-related cancers include: ? Breast. ? Ovarian. ? Tubal. ? Peritoneal cancers.  Results of the assessment will determine the need for genetic counseling and BRCA1 and BRCA2 testing.  Cervical Cancer Your health care provider may recommend that you be screened regularly for cancer of the pelvic organs (ovaries, uterus, and vagina). This screening involves a pelvic examination, including checking for microscopic changes to the surface of your cervix (Pap test). You may be encouraged to have this screening done every 3 years, beginning at age 61.  For women ages 71-65, health care providers may recommend pelvic exams and Pap testing every 3 years, or they may recommend the Pap and pelvic exam, combined with testing for human papilloma virus (HPV), every 5 years. Some types of HPV increase your risk of cervical cancer. Testing for HPV may also be done on women of any age with unclear Pap test results.  Other health care providers may not recommend any screening for nonpregnant women who are considered low risk for pelvic cancer and who do not have symptoms. Ask your health care provider if a screening pelvic exam is right for you.  If you have had past treatment for cervical cancer or a condition that could lead to cancer, you need Pap tests and screening for cancer for at least 20 years after your treatment. If Pap tests have been discontinued, your risk factors (such as having a new sexual partner) need to be reassessed to determine if screening should resume. Some women have medical problems that increase the chance of getting cervical cancer. In these cases, your health care provider may recommend more frequent screening and Pap tests.  Colorectal Cancer  This type of cancer can be detected and often prevented.  Routine colorectal cancer screening usually begins at 25 years of age and continues through 25  years of age.  Your health care provider may recommend screening at an earlier age if you have risk factors for colon cancer.  Your health care provider may also recommend using home test kits to check for hidden blood in the stool.  A small camera at the end of a tube can be used to examine your colon directly (sigmoidoscopy or colonoscopy). This is done to check for the earliest forms of colorectal cancer.  Routine screening usually begins at age 84.  Direct examination of the colon should be repeated every 5-10 years through 25 years of age. However, you may need to be screened more often if early forms of precancerous polyps or small growths are found.  Skin Cancer  Check your skin from head to toe regularly.  Tell your health care provider about any new moles or changes in moles, especially if there is a change in a mole's shape or color.  Also tell your health care provider if you have a mole that is larger than the size of a pencil eraser.  Always use sunscreen. Apply sunscreen liberally and repeatedly throughout the day.  Protect yourself by wearing long sleeves, pants, a wide-brimmed hat, and sunglasses whenever you are outside.  Heart disease, diabetes, and high blood pressure  High blood pressure causes heart disease and increases the risk of stroke. High blood pressure is more likely to develop in: ? People who have blood pressure in the high end of the normal range (130-139/85-89 mm Hg). ? People who are overweight or obese. ? People who are African American.  If you are 107-17 years of age, have your blood pressure checked every 3-5 years. If you are 50 years of age or older, have your blood pressure checked every year. You should have your blood pressure measured twice-once when you are at a hospital or clinic, and once when you are not at a hospital or clinic. Record the average of the two measurements. To check your blood pressure when you are not at a hospital or  clinic, you can use: ? An automated blood pressure machine at a pharmacy. ? A home blood pressure monitor.  If you are between 26 years and 80 years old, ask your health care provider if you should take aspirin to prevent strokes.  Have regular diabetes screenings. This involves taking a blood sample to check your fasting blood sugar level. ? If you are at a normal weight and have a low risk for diabetes, have this test once every three years after 25 years of age. ? If you are overweight and have a high risk for diabetes, consider being tested at a younger age or more often. Preventing infection Hepatitis B  If you have a higher risk for hepatitis B, you should be screened for this virus. You are considered at high risk for hepatitis B if: ? You were born in a country where hepatitis B is common. Ask your health care provider which countries are considered high risk. ? Your parents were born in a high-risk country, and you have not been immunized against hepatitis B (hepatitis B vaccine). ? You have HIV or AIDS. ? You use needles to inject street drugs. ? You live with someone who has hepatitis B. ? You have had sex with someone who has hepatitis B. ? You get hemodialysis treatment. ? You take certain medicines for conditions, including cancer, organ transplantation, and autoimmune conditions.  Hepatitis C  Blood testing is recommended for: ? Everyone born from [birth year] through 1963. ? Anyone with known risk factors for hepatitis C.  Sexually transmitted infections (STIs)  You should be screened for sexually transmitted infections (STIs) including gonorrhea and chlamydia if: ? You are sexually active and are younger than 25 years of age. ? You are older than 25 years of age and your health care provider tells you that you are at risk for this type of infection. ? Your sexual activity has  changed since you were last screened and you are at an increased risk for chlamydia or gonorrhea.  Ask your health care provider if you are at risk.  If you do not have HIV, but are at risk, it may be recommended that you take a prescription medicine daily to prevent HIV infection. This is called pre-exposure prophylaxis (PrEP). You are considered at risk if: ? You are sexually active and do not regularly use condoms or know the HIV status of your partner(s). ? You take drugs by injection. ? You are sexually active with a partner who has HIV.  Talk with your health care provider about whether you are at high risk of being infected with HIV. If you choose to begin PrEP, you should first be tested for HIV. You should then be tested every 3 months for as long as you are taking PrEP. Pregnancy  If you are premenopausal and you may become pregnant, ask your health care provider about preconception counseling.  If you may become pregnant, take 400 to 800 micrograms (mcg) of folic acid every day.  If you want to prevent pregnancy, talk to your health care provider about birth control (contraception). Osteoporosis and menopause  Osteoporosis is a disease in which the bones lose minerals and strength with aging. This can result in serious bone fractures. Your risk for osteoporosis can be identified using a bone density scan.  If you are 85 years of age or older, or if you are at risk for osteoporosis and fractures, ask your health care provider if you should be screened.  Ask your health care provider whether you should take a calcium or vitamin D supplement to lower your risk for osteoporosis.  Menopause may have certain physical symptoms and risks.  Hormone replacement therapy may reduce some of these symptoms and risks. Talk to your health care provider about whether hormone replacement therapy is right for you. Follow these instructions at home:  Schedule regular health, dental, and eye exams.  Stay current with your immunizations.  Do not use any tobacco products including cigarettes,  chewing tobacco, or electronic cigarettes.  If you are pregnant, do not drink alcohol.  If you are breastfeeding, limit how much and how often you drink alcohol.  Limit alcohol intake to no more than 1 drink per day for nonpregnant women. One drink equals 12 ounces of beer, 5 ounces of wine, or 1 ounces of hard liquor.  Do not use street drugs.  Do not share needles.  Ask your health care provider for help if you need support or information about quitting drugs.  Tell your health care provider if you often feel depressed.  Tell your health care provider if you have ever been abused or do not feel safe at home. This information is not intended to replace advice given to you by your health care provider. Make sure you discuss any questions you have with your health care provider. Document Released: 12/16/2010 Document Revised: 11/08/2015 Document Reviewed: 03/06/2015 Elsevier Interactive Patient Education  2018 [place]. Panosh M.D.

## 2017-11-11 DIAGNOSIS — O4290 Premature rupture of membranes, unspecified as to length of time between rupture and onset of labor, unspecified weeks of gestation: Secondary | ICD-10-CM | POA: Diagnosis not present

## 2017-11-11 DIAGNOSIS — Z3480 Encounter for supervision of other normal pregnancy, unspecified trimester: Secondary | ICD-10-CM | POA: Diagnosis not present

## 2017-11-11 DIAGNOSIS — Z23 Encounter for immunization: Secondary | ICD-10-CM | POA: Diagnosis not present

## 2017-11-11 DIAGNOSIS — E039 Hypothyroidism, unspecified: Secondary | ICD-10-CM | POA: Diagnosis not present

## 2017-11-11 MED FILL — PROAIR HFA 90 MCG INHALER: 108 (90 BAS | 25 days supply | Qty: 9 | Fill #0

## 2017-11-11 MED FILL — FLUCONAZOLE 150 MG TABS: 150 | 3 days supply | Qty: 2 | Fill #0

## 2017-11-12 MED FILL — ALBUTEROL 0.083% INHAL SOLN: (2.5 MG/3ML | 4 days supply | Qty: 75 | Fill #0

## 2017-11-12 MED FILL — lamoTRIgine 100 MG TABS: 100 | 30 days supply | Qty: 30 | Fill #0

## 2017-11-17 ENCOUNTER — Encounter (HOSPITAL_COMMUNITY): Payer: Self-pay

## 2017-11-17 ENCOUNTER — Other Ambulatory Visit (HOSPITAL_COMMUNITY): Payer: Self-pay | Admitting: *Deleted

## 2017-11-17 ENCOUNTER — Ambulatory Visit (INDEPENDENT_AMBULATORY_CARE_PROVIDER_SITE_OTHER): Payer: 59 | Admitting: Allergy and Immunology

## 2017-11-17 ENCOUNTER — Other Ambulatory Visit (HOSPITAL_COMMUNITY): Payer: Self-pay | Admitting: Maternal & Fetal Medicine

## 2017-11-17 ENCOUNTER — Encounter: Payer: Self-pay | Admitting: Allergy and Immunology

## 2017-11-17 ENCOUNTER — Ambulatory Visit (HOSPITAL_COMMUNITY)
Admission: RE | Admit: 2017-11-17 | Discharge: 2017-11-17 | Disposition: A | Discharge status: Home / Self Care | Payer: 59 | Source: Ambulatory Visit | Attending: Obstetrics and Gynecology | Admitting: Obstetrics and Gynecology

## 2017-11-17 VITALS — BP 120/76 | HR 110 | Resp 24

## 2017-11-17 DIAGNOSIS — O403XX0 Polyhydramnios, third trimester, not applicable or unspecified: Secondary | ICD-10-CM

## 2017-11-17 DIAGNOSIS — O358XX0 Maternal care for other (suspected) fetal abnormality and damage, not applicable or unspecified: Secondary | ICD-10-CM

## 2017-11-17 DIAGNOSIS — O99213 Obesity complicating pregnancy, third trimester: Secondary | ICD-10-CM | POA: Insufficient documentation

## 2017-11-17 DIAGNOSIS — O409XX0 Polyhydramnios, unspecified trimester, not applicable or unspecified: Secondary | ICD-10-CM

## 2017-11-17 DIAGNOSIS — J4550 Severe persistent asthma, uncomplicated: Secondary | ICD-10-CM | POA: Diagnosis not present

## 2017-11-17 DIAGNOSIS — O281 Abnormal biochemical finding on antenatal screening of mother: Secondary | ICD-10-CM

## 2017-11-17 DIAGNOSIS — O99513 Diseases of the respiratory system complicating pregnancy, third trimester: Secondary | ICD-10-CM

## 2017-11-17 DIAGNOSIS — E039 Hypothyroidism, unspecified: Secondary | ICD-10-CM

## 2017-11-17 DIAGNOSIS — O99283 Endocrine, nutritional and metabolic diseases complicating pregnancy, third trimester: Secondary | ICD-10-CM | POA: Diagnosis not present

## 2017-11-17 DIAGNOSIS — J3089 Other allergic rhinitis: Secondary | ICD-10-CM

## 2017-11-17 DIAGNOSIS — Z3A28 28 weeks gestation of pregnancy: Secondary | ICD-10-CM

## 2017-11-17 DIAGNOSIS — Z362 Encounter for other antenatal screening follow-up: Secondary | ICD-10-CM

## 2017-11-17 DIAGNOSIS — H1010 Acute atopic conjunctivitis, unspecified eye: Secondary | ICD-10-CM

## 2017-11-17 DIAGNOSIS — K219 Gastro-esophageal reflux disease without esophagitis: Secondary | ICD-10-CM

## 2017-11-17 DIAGNOSIS — J45909 Unspecified asthma, uncomplicated: Secondary | ICD-10-CM

## 2017-11-17 NOTE — Progress Notes (Signed)
Follow-up Note  Referring Provider: Burnis Medin, MD Primary Provider: Burnis Medin, MD Date of Office Visit: 11/17/2017  Subjective:   Linda Becker (DOB: Oct 18, 1990) is a 27 y.o. female who returns to the [facility] on 11/17/2017 in re-evaluation of the following:  HPI: Linda Becker returns to this clinic in reevaluation of her asthma and allergic rhinoconjunctivitis and reflux and pregnancy.  I last saw her in this clinic in 01 September 2017 at which point in time she appeared to be having some increased activity of her asthma.  She has continued to have issues with intermittent wheezing and coughing and shortness of breath and she has been using her nebulizer 1 or 2 times per day.  On this plan she thinks she is doing relatively well.  As noted previously she has discontinued her anti-[place]-5 biological agent when she became pregnant.  Her nose has been doing quite well while using some Qnasl.  Her reflux is out of control even on Nexium twice a day and she has been adding Tums throughout the day.  Her plan is to breast-feed after delivery in July.  Allergies as of 11/17/2017      Reactions   Progesterone Rash   Was in a form of birth control.      Medication List      albuterol (2.5 MG/3ML) 0.083% nebulizer solution Commonly known as:  PROVENTIL Inhale the contents of one vial in nebulizer every four to six hours as needed for cough or wheeze.   PROAIR HFA 108 (90 Base) MCG/ACT inhaler Generic drug:  albuterol INHALE 2 PUFFS BY MOUTH EVERY 6 HOURS AS NEEDED FOR WHEEZING OR SHORTNESS OF BREATH.   ALL DAY ALLERGY 10 MG tablet Generic drug:  cetirizine TAKE 1 TABLET (10 MG TOTAL) BY MOUTH DAILY.   Beclomethasone Dipropionate 80 MCG/ACT Aers Commonly known as:  QNASL Use one to two puffs in each nostril once daily as directed.   Butalbital-APAP-Caffeine 50-300-40 MG Caps butalbital-acetaminophen-caffeine 50 mg-300 mg-40 mg capsule  TAKE 1  CAPSULE BY MOUTH EVERY 6 HOURS AS NEEDED (LIMIT TO 2 TO 3 PER WEEK)   esomeprazole 40 MG capsule Commonly known as:  NEXIUM TAKE ONE CAPSULE TWICE DAILY AS DIRECTED   lamoTRIgine 100 MG tablet Commonly known as:  LAMICTAL Take 100 mg by mouth daily.   levothyroxine 88 MCG tablet Commonly known as:  SYNTHROID, LEVOTHROID Take 1 tablet (88 mcg total) by mouth daily.   montelukast 10 MG tablet Commonly known as:  SINGULAIR Take 1 tablet (10 mg total) by mouth daily.   ondansetron 4 MG tablet Commonly known as:  ZOFRAN   PATADAY 0.2 % Soln Generic drug:  Olopatadine HCl Apply 1 drop to eye daily as needed.   PRE-NATAL PO Take by mouth daily.   PROBIOTIC PO Take by mouth daily.   SYMBICORT 160-4.5 MCG/ACT inhaler Generic drug:  budesonide-formoterol Inhale 2 puffs twice a day to prevent cough or wheeze.  Rinse, gargle, and spit after use.   Vitamin D3 1000 UNIT/SPRAY Liqd Take by mouth at bedtime.       Past Medical History:  Diagnosis Date  . Allergic rhinitis    remote hx of allergy shots  . Asthma    since childhood  on controller meds extrinsic dr Carmelina Peal  . GERD (gastroesophageal reflux disease)    on nexium  for  long term sx since childhood  . H/O miscarriage, not currently pregnant    [redacted]  weeks  march 2016  . Hypothyroidism   . Migraine   . Murmur    pt reports MVP  . Syncope    under eval ? cause  . Tachycardia    episodes with near syncope eval dr Nadyne Coombes  on no meds dced LABA    Past Surgical History:  Procedure Laterality Date  . broken right femur  2008   rod placed  . DILATION AND CURETTAGE OF UTERUS    . TONSILLECTOMY  2006  . WISDOM TOOTH EXTRACTION      Review of systems negative except as noted in HPI / PMHx or noted below:  Review of Systems  Constitutional: Negative.   HENT: Negative.   Eyes: Negative.   Respiratory: Negative.   Cardiovascular: Negative.   Gastrointestinal: Negative.   Genitourinary: Negative.     Musculoskeletal: Negative.   Skin: Negative.   Neurological: Negative.   Endo/Heme/Allergies: Negative.   Psychiatric/Behavioral: Negative.      Objective:   Vitals:   11/17/17 1513  BP: 120/76  Pulse: (!) 110  Resp: (!) 24  SpO2: 98%          Physical Exam  HENT:  Head: Normocephalic.  Right Ear: Tympanic membrane, external ear and ear canal normal.  Left Ear: Tympanic membrane, external ear and ear canal normal.  Nose: No rhinorrhea. Epistaxis (Slight mucosal bleed right septum) is observed.  Mouth/Throat: Uvula is midline, oropharynx is clear and moist and mucous membranes are normal. No oropharyngeal exudate.  Eyes: Conjunctivae are normal.  Neck: Trachea normal. No tracheal tenderness present. No tracheal deviation present. No thyromegaly present.  Cardiovascular: Normal rate, regular rhythm, S1 normal, S2 normal and normal heart sounds.  No murmur heard. Pulmonary/Chest: Breath sounds normal. No stridor. No respiratory distress. She has no wheezes. She has no rales.  Musculoskeletal: She exhibits no edema.  Lymphadenopathy:       Head (right side): No tonsillar adenopathy present.       Head (left side): No tonsillar adenopathy present.    She has no cervical adenopathy.  Neurological: She is alert.  Skin: No rash noted. She is not diaphoretic. No erythema. Nails show no clubbing.    Diagnostics:    Spirometry was performed and demonstrated an FEV1 of 2.84 at 81 % of predicted.  The patient had an Asthma Control Test with the following results: ACT Total Score: 10.    Assessment and Plan:   1. Not well controlled severe persistent asthma   2. Other allergic rhinitis   3. Seasonal allergic conjunctivitis   4. Gastroesophageal reflux disease, esophagitis presence not specified     1. Continue Symbicort 160 two inhalation two times per day with spacer  2. Continue montelukast 41m one tablet one time per day  3. Continue Nexium 40 mg one tablet twice a  day and can add ranitidine 150-300 mg in evening.  Discuss with obstetrician about ranitidine use  4. Continue Qnasl 80 - 1-2 puffs each nostril 3-7 times per week. Decrease use in right nostril    5. Continue ProAir HFA 2 inhalations or albuterol neb every 4-6 hours if needed.  6. Continue antihistamine if needed.  7. Return in 12 weeks or earlier if problem. Dupilumab?   I think the best we can hope for at this point in time is to have Linda Becker continue on her current plan until delivery and then start her on a biological agent at some point after delivery.  She will be breast-feeding starting  July 2019 and we need to be cognizant of that fact when we prescribed medications in the control of her atopic disease and her reflux.  Her reflux is out of control and she can discuss with her obstetrician about using ranitidine in the evening in addition to her Nexium.  She will continue on anti-inflammatory agents for her respiratory tract as noted above and I will see her back in this clinic in 12 weeks or earlier if there is a problem.  Allena Katz, MD Allergy / Immunology [place]

## 2017-11-17 NOTE — Patient Instructions (Addendum)
  1. Continue Symbicort 160 two inhalation two times per day with spacer  2. Continue montelukast 81m one tablet one time per day  3. Continue Nexium 40 mg one tablet twice a day and can add ranitidine 150-300 mg in evening.  Discuss with obstetrician about ranitidine use  4. Continue Qnasl 80 - 1-2 puffs each nostril 3-7 times per week. Decrease use in right nostril    5. Continue ProAir HFA 2 inhalations or albuterol neb every 4-6 hours if needed.  6. Continue antihistamine if needed.  7. Return in 12 weeks or earlier if problem. Dupilumab?

## 2017-11-18 ENCOUNTER — Encounter: Payer: Self-pay | Admitting: Allergy and Immunology

## 2017-11-20 ENCOUNTER — Other Ambulatory Visit: Payer: Self-pay | Admitting: Allergy and Immunology

## 2017-11-20 MED FILL — ESOMEPRAZOLE MAG DR 40 MG C: 40 | 30 days supply | Qty: 60 | Fill #0

## 2017-11-23 DIAGNOSIS — O403XX0 Polyhydramnios, third trimester, not applicable or unspecified: Secondary | ICD-10-CM | POA: Diagnosis not present

## 2017-11-30 ENCOUNTER — Other Ambulatory Visit: Payer: Self-pay | Admitting: *Deleted

## 2017-11-30 NOTE — Patient Outreach (Signed)
[facility]) Care Management  11/30/2017  Espyn Radwan 1990-08-19 [id number]   [place] is in the [facility]. Secure e-mail sent to her asking for clinical update and status of gestational diabetes self management.  Barrington Ellison RN,CCM,CDE [facility] Management Coordinator Office Phone [phone] Office Fax [phone]

## 2017-12-01 DIAGNOSIS — L299 Pruritus, unspecified: Secondary | ICD-10-CM | POA: Diagnosis not present

## 2017-12-01 DIAGNOSIS — O403XX3 Polyhydramnios, third trimester, fetus 3: Secondary | ICD-10-CM | POA: Diagnosis not present

## 2017-12-01 DIAGNOSIS — O139 Gestational [pregnancy-induced] hypertension without significant proteinuria, unspecified trimester: Secondary | ICD-10-CM | POA: Diagnosis not present

## 2017-12-01 HISTORY — PX: OTHER SURGICAL HISTORY: SHX169

## 2017-12-01 MED FILL — BUTALB-ACETAMIN-CAFF 50-300: 50-300-40 | 8 days supply | Qty: 30 | Fill #0

## 2017-12-02 ENCOUNTER — Other Ambulatory Visit: Payer: Self-pay | Admitting: *Deleted

## 2017-12-02 MED FILL — MONTELUKAST SOD 10 MG TAB: 10 | 30 days supply | Qty: 30 | Fill #3

## 2017-12-02 NOTE — Patient Outreach (Signed)
[facility]) Care Management  12/02/2017  Lilymarie Scroggins 08/15/1990 [id number]   Received the following reply e-mail from [name] regarding the status of her gestational diabetes management:  "Good afternoon!  I've been doing okay, we've encountered several other complications with the pregnancy. My blood sugars have been stable at a range from 60-95 in the morning, 115-170 in the afternoon, and around 110-140 in the evening. "    Return e-mail sent to [place] her for her response, asking if the pregnancy complications are serious ones,  and reminding her to reach out to this [facility] for any clinical concerns about gestational diabetes self management.  Barrington Ellison RN,CCM,CDE [facility] Management Coordinator Office Phone [phone] Office Fax [phone]

## 2017-12-03 MED FILL — URSODIOL 500 MG TABLET: 500 | 30 days supply | Qty: 60 | Fill #0

## 2017-12-07 ENCOUNTER — Other Ambulatory Visit: Payer: Self-pay | Admitting: Allergy and Immunology

## 2017-12-07 MED FILL — PROAIR HFA 90 MCG INHALER: 108 (90 BAS | 25 days supply | Qty: 9 | Fill #0

## 2017-12-07 MED FILL — SYMBICORT 160-4.5 MCG INH: 160-4.5 | 30 days supply | Qty: 10 | Fill #2

## 2017-12-08 ENCOUNTER — Other Ambulatory Visit (HOSPITAL_COMMUNITY): Payer: Self-pay | Admitting: Maternal and Fetal Medicine

## 2017-12-08 ENCOUNTER — Encounter (HOSPITAL_COMMUNITY): Payer: Self-pay

## 2017-12-08 ENCOUNTER — Ambulatory Visit (HOSPITAL_COMMUNITY)
Admission: RE | Admit: 2017-12-08 | Discharge: 2017-12-08 | Disposition: A | Discharge status: Home / Self Care | Payer: 59 | Source: Ambulatory Visit | Attending: Obstetrics and Gynecology | Admitting: Obstetrics and Gynecology

## 2017-12-08 ENCOUNTER — Other Ambulatory Visit (HOSPITAL_COMMUNITY): Payer: Self-pay | Admitting: *Deleted

## 2017-12-08 DIAGNOSIS — K831 Obstruction of bile duct: Secondary | ICD-10-CM

## 2017-12-08 DIAGNOSIS — O409XX0 Polyhydramnios, unspecified trimester, not applicable or unspecified: Secondary | ICD-10-CM

## 2017-12-08 DIAGNOSIS — Z3A31 31 weeks gestation of pregnancy: Secondary | ICD-10-CM | POA: Insufficient documentation

## 2017-12-08 DIAGNOSIS — O26613 Liver and biliary tract disorders in pregnancy, third trimester: Secondary | ICD-10-CM | POA: Diagnosis not present

## 2017-12-08 DIAGNOSIS — O403XX0 Polyhydramnios, third trimester, not applicable or unspecified: Secondary | ICD-10-CM | POA: Diagnosis not present

## 2017-12-11 ENCOUNTER — Encounter (HOSPITAL_COMMUNITY): Payer: Self-pay

## 2017-12-11 ENCOUNTER — Inpatient Hospital Stay (HOSPITAL_COMMUNITY)
Admission: AD | Admit: 2017-12-11 | Discharge: 2017-12-11 | Disposition: A | Discharge status: Home / Self Care | Payer: 59 | Source: Ambulatory Visit | Attending: Obstetrics and Gynecology | Admitting: Obstetrics and Gynecology

## 2017-12-11 ENCOUNTER — Other Ambulatory Visit: Payer: Self-pay

## 2017-12-11 DIAGNOSIS — X58XXXA Exposure to other specified factors, initial encounter: Secondary | ICD-10-CM | POA: Diagnosis not present

## 2017-12-11 DIAGNOSIS — O99613 Diseases of the digestive system complicating pregnancy, third trimester: Secondary | ICD-10-CM | POA: Diagnosis not present

## 2017-12-11 DIAGNOSIS — O99283 Endocrine, nutritional and metabolic diseases complicating pregnancy, third trimester: Secondary | ICD-10-CM | POA: Diagnosis not present

## 2017-12-11 DIAGNOSIS — Z3A31 31 weeks gestation of pregnancy: Secondary | ICD-10-CM | POA: Diagnosis not present

## 2017-12-11 DIAGNOSIS — O9A213 Injury, poisoning and certain other consequences of external causes complicating pregnancy, third trimester: Secondary | ICD-10-CM | POA: Diagnosis not present

## 2017-12-11 DIAGNOSIS — K219 Gastro-esophageal reflux disease without esophagitis: Secondary | ICD-10-CM | POA: Insufficient documentation

## 2017-12-11 DIAGNOSIS — J45909 Unspecified asthma, uncomplicated: Secondary | ICD-10-CM | POA: Insufficient documentation

## 2017-12-11 DIAGNOSIS — E039 Hypothyroidism, unspecified: Secondary | ICD-10-CM | POA: Insufficient documentation

## 2017-12-11 DIAGNOSIS — O99513 Diseases of the respiratory system complicating pregnancy, third trimester: Secondary | ICD-10-CM | POA: Insufficient documentation

## 2017-12-11 DIAGNOSIS — S301XXA Contusion of abdominal wall, initial encounter: Secondary | ICD-10-CM | POA: Diagnosis not present

## 2017-12-11 DIAGNOSIS — Z3689 Encounter for other specified antenatal screening: Secondary | ICD-10-CM | POA: Diagnosis not present

## 2017-12-11 NOTE — MAU Note (Signed)
No bruising noted on  abd when listening to fetal heart rate.

## 2017-12-11 NOTE — MAU Provider Note (Signed)
History     CSN: [id number]  Arrival date and time: 12/11/17 1114   First Provider Initiated Contact with Patient 12/11/17 1203      Chief Complaint  Patient presents with  . abd bruising.   HPI  [name] is a 27 y.o. G2P0010 at 35w4dwho presents to MAU today with complaint of bruising on her abdomen. The patient states that her mother noticed bruising on her upper abdomen and LLQ this morning. She denies injury to the area. She denies pain. She states decreased FM was reported to the on-call physician earlier today, but she feels normal movement now. She denies contractions, vaginal bleeding or LOF.   OB History    Gravida  2   Para      Term      Preterm      AB  1   Living  0     SAB  1   TAB      Ectopic      Multiple      Live Births              Past Medical History:  Diagnosis Date  . Allergic rhinitis    remote hx of allergy shots  . Asthma    since childhood  on controller meds extrinsic dr kCarmelina Peal . GERD (gastroesophageal reflux disease)    on nexium  for  long term sx since childhood  . H/O miscarriage, not currently pregnant    [redacted] weeks  march 2016  . Hypothyroidism   . Migraine   . Murmur    pt reports MVP  . Syncope    under eval ? cause  . Tachycardia    episodes with near syncope eval dr GNadyne Coombes on no meds dced LABA    Past Surgical History:  Procedure Laterality Date  . broken right femur  2008   rod placed  . DILATION AND CURETTAGE OF UTERUS    . TONSILLECTOMY  2006  . WISDOM TOOTH EXTRACTION      Family History  Problem Relation Age of Onset  . Asthma Father   . Arthritis Father   . Hyperlipidemia Father   . Heart disease Father   . Hypertension Father   . Diabetes Father   . Arthritis Mother   . Hyperlipidemia Mother   . Heart disease Mother   . Hypertension Mother   . Diabetes Mother   . Arthritis Maternal Grandmother   . Breast cancer Maternal Grandmother   . Thyroid disease Maternal  Grandmother   . Colon cancer Paternal Grandfather   . Diabetes Paternal Grandfather   . Breast cancer Paternal Grandmother   . Diabetes Paternal Grandmother   . Breast cancer Paternal Aunt   . Breast cancer Paternal Aunt   . Diabetes Maternal Grandfather     Social History   Tobacco Use  . Smoking status: Never Smoker  . Smokeless tobacco: Never Used  Substance Use Topics  . Alcohol use: No    Alcohol/week: 0.0 oz    Frequency: Never  . Drug use: No    Allergies:  Allergies  Allergen Reactions  . Progesterone Rash    Was in a form of birth control.    No medications prior to admission.    Review of Systems  Constitutional: Negative for fever.  Gastrointestinal: Negative for abdominal distention, abdominal pain, constipation, diarrhea, nausea and vomiting.  Genitourinary: Negative for dysuria, frequency, urgency, vaginal bleeding and vaginal discharge.  Physical Exam   Blood pressure (!) 128/52, pulse (!) 106, temperature 98.3 F (36.8 C), temperature source Oral, resp. rate 20, weight (!) 348 lb 12 oz (158.2 kg), last menstrual period 05/04/2017, SpO2 100 %.  Physical Exam  Nursing note and vitals reviewed. Constitutional: She is oriented to person, place, and time. She appears well-developed and well-nourished. No distress.  HENT:  Head: Normocephalic and atraumatic.  Cardiovascular: Normal rate.  Respiratory: Effort normal.  GI: Soft. She exhibits no distension and no mass. There is no tenderness. There is no rebound and no guarding.  Small fluid collection of the panus with mild erythema noted. Non-tender.   Neurological: She is alert and oriented to person, place, and time.  Skin: Skin is warm and dry. No erythema.  Psychiatric: She has a normal mood and affect.    Fetal Monitoring: Baseline: 140 bpm Variability: moderate Accelerations: 15 x 15, 10 x 10 Decelerations: none Contractions: none   MAU Course  Procedures None  MDM Discussed with Dr.  Rogue Bussing. [place] for discharge at this time. Follow-up as scheduled.   Assessment and Plan  A: SIUP at 51w4dReactive NST  P: Discharge home Warning signs for worsening condition discussed [facility] or sooner if symptoms worsen Patient may return to MAU as needed or if her condition were to change or worsen  JKerry Hough PA-C 12/11/2017, 1:54 PM

## 2017-12-11 NOTE — MAU Note (Signed)
Urine in lab 

## 2017-12-11 NOTE — MAU Note (Signed)
Is around 32 wks preg.  Noted some bruising on abd this morning when getting ready.  Denies any abd trauma, no injections.  Was dx a wk ago with cholestasis, was started on a new medication.

## 2017-12-11 NOTE — Discharge Instructions (Signed)
Fetal Movement Counts Patient Name: ________________________________________________ Patient Due Date: ____________________ What is a fetal movement count? A fetal movement count is the number of times that you feel your baby move during a certain amount of time. This may also be called a fetal kick count. A fetal movement count is recommended for every pregnant woman. You may be asked to start counting fetal movements as early as week 28 of your pregnancy. Pay attention to when your baby is most active. You may notice your baby's sleep and wake cycles. You may also notice things that make your baby move more. You should do a fetal movement count:  When your baby is normally most active.  At the same time each day.  A good time to count movements is while you are resting, after having something to eat and drink. How do I count fetal movements? 1. Find a quiet, comfortable area. Sit, or lie down on your side. 2. Write down the date, the start time and stop time, and the number of movements that you felt between those two times. Take this information with you to your health care visits. 3. For 2 hours, count kicks, flutters, swishes, rolls, and jabs. You should feel at least 10 movements during 2 hours. 4. You may stop counting after you have felt 10 movements. 5. If you do not feel 10 movements in 2 hours, have something to eat and drink. Then, keep resting and counting for 1 hour. If you feel at least 4 movements during that hour, you may stop counting. Contact a health care provider if:  You feel fewer than 4 movements in 2 hours.  Your baby is not moving like he or she usually does. Date: ____________ Start time: ____________ Stop time: ____________ Movements: ____________ Date: ____________ Start time: ____________ Stop time: ____________ Movements: ____________ Date: ____________ Start time: ____________ Stop time: ____________ Movements: ____________ Date: ____________ Start time:  ____________ Stop time: ____________ Movements: ____________ Date: ____________ Start time: ____________ Stop time: ____________ Movements: ____________ Date: ____________ Start time: ____________ Stop time: ____________ Movements: ____________ Date: ____________ Start time: ____________ Stop time: ____________ Movements: ____________ Date: ____________ Start time: ____________ Stop time: ____________ Movements: ____________ Date: ____________ Start time: ____________ Stop time: ____________ Movements: ____________ This information is not intended to replace advice given to you by your health care provider. Make sure you discuss any questions you have with your health care provider. Document Released: 07/02/2006 Document Revised: 01/30/2016 Document Reviewed: 07/12/2015 Elsevier Interactive Patient Education  2018 [employer]. [employer] of the uterus can occur throughout pregnancy, but they are not always a sign that you are in labor. You may have practice contractions called Braxton Hicks contractions. These false labor contractions are sometimes confused with true labor. What are [place] contractions? Braxton Hicks contractions are tightening movements that occur in the muscles of the uterus before labor. Unlike true labor contractions, these contractions do not result in opening (dilation) and thinning of the cervix. Toward the end of pregnancy (32-34 weeks), Braxton Hicks contractions can happen more often and may become stronger. These contractions are sometimes difficult to tell apart from true labor because they can be very uncomfortable. You should not feel embarrassed if you go to the hospital with false labor. Sometimes, the only way to tell if you are in true labor is for your health care provider to look for changes in the cervix. The health care provider will do a physical exam and may monitor your contractions. If  you are not in true labor, the exam  should show that your cervix is not dilating and your water has not broken. If there are other health problems associated with your pregnancy, it is completely safe for you to be sent home with false labor. You may continue to have Braxton Hicks contractions until you go into true labor. How to tell the difference between true labor and false labor True labor  Contractions last 30-70 seconds.  Contractions become very regular.  Discomfort is usually felt in the top of the uterus, and it spreads to the lower abdomen and low back.  Contractions do not go away with walking.  Contractions usually become more intense and increase in frequency.  The cervix dilates and gets thinner. False labor  Contractions are usually shorter and not as strong as true labor contractions.  Contractions are usually irregular.  Contractions are often felt in the front of the lower abdomen and in the groin.  Contractions may go away when you walk around or change positions while lying down.  Contractions get weaker and are shorter-lasting as time goes on.  The cervix usually does not dilate or become thin. Follow these instructions at home:  Take over-the-counter and prescription medicines only as told by your health care provider.  Keep up with your usual exercises and follow other instructions from your health care provider.  Eat and drink lightly if you think you are going into labor.  If Braxton Hicks contractions are making you uncomfortable: ? Change your position from lying down or resting to walking, or change from walking to resting. ? Sit and rest in a tub of warm water. ? Drink enough fluid to keep your urine pale yellow. Dehydration may cause these contractions. ? Do slow and deep breathing several times an hour.  Keep all follow-up prenatal visits as told by your health care provider. This is important. Contact a health care provider if:  You have a fever.  You have continuous pain  in your abdomen. Get help right away if:  Your contractions become stronger, more regular, and closer together.  You have fluid leaking or gushing from your vagina.  You pass blood-tinged mucus (bloody show).  You have bleeding from your vagina.  You have low back pain that you never had before.  You feel your babys head pushing down and causing pelvic pressure.  Your baby is not moving inside you as much as it used to. Summary  Contractions that occur before labor are called Braxton Hicks contractions, false labor, or practice contractions.  Braxton Hicks contractions are usually shorter, weaker, farther apart, and less regular than true labor contractions. True labor contractions usually become progressively stronger and regular and they become more frequent.  Manage discomfort from [facility] contractions by changing position, resting in a warm bath, drinking plenty of water, or practicing deep breathing. This information is not intended to replace advice given to you by your health care provider. Make sure you discuss any questions you have with your health care provider. Document Released: 10/16/2016 Document Revised: 10/16/2016 Document Reviewed: 10/16/2016 Elsevier Interactive Patient Education  2018 [employer].

## 2017-12-15 ENCOUNTER — Encounter (HOSPITAL_COMMUNITY): Payer: Self-pay

## 2017-12-15 ENCOUNTER — Other Ambulatory Visit: Payer: Self-pay | Admitting: Endocrinology

## 2017-12-15 ENCOUNTER — Other Ambulatory Visit (HOSPITAL_COMMUNITY): Payer: Self-pay | Admitting: Obstetrics and Gynecology

## 2017-12-15 ENCOUNTER — Ambulatory Visit (HOSPITAL_COMMUNITY)
Admission: RE | Admit: 2017-12-15 | Discharge: 2017-12-15 | Disposition: A | Discharge status: Home / Self Care | Payer: 59 | Source: Ambulatory Visit | Attending: Obstetrics and Gynecology | Admitting: Obstetrics and Gynecology

## 2017-12-15 DIAGNOSIS — Z3A32 32 weeks gestation of pregnancy: Secondary | ICD-10-CM | POA: Insufficient documentation

## 2017-12-15 DIAGNOSIS — O403XX0 Polyhydramnios, third trimester, not applicable or unspecified: Secondary | ICD-10-CM | POA: Insufficient documentation

## 2017-12-15 DIAGNOSIS — Z369 Encounter for antenatal screening, unspecified: Secondary | ICD-10-CM | POA: Diagnosis not present

## 2017-12-15 DIAGNOSIS — O409XX0 Polyhydramnios, unspecified trimester, not applicable or unspecified: Secondary | ICD-10-CM

## 2017-12-15 DIAGNOSIS — O99213 Obesity complicating pregnancy, third trimester: Secondary | ICD-10-CM

## 2017-12-15 DIAGNOSIS — O26613 Liver and biliary tract disorders in pregnancy, third trimester: Secondary | ICD-10-CM | POA: Diagnosis not present

## 2017-12-15 DIAGNOSIS — K831 Obstruction of bile duct: Secondary | ICD-10-CM

## 2017-12-15 MED FILL — LEVOTHYROXINE 88 MCG TABLET: 88 | 30 days supply | Qty: 30 | Fill #0

## 2017-12-15 MED FILL — ESOMEPRAZOLE MAG DR 40 MG C: 40 | 30 days supply | Qty: 60 | Fill #1

## 2017-12-18 ENCOUNTER — Other Ambulatory Visit (INDEPENDENT_AMBULATORY_CARE_PROVIDER_SITE_OTHER): Payer: 59

## 2017-12-18 DIAGNOSIS — E039 Hypothyroidism, unspecified: Secondary | ICD-10-CM

## 2017-12-18 LAB — T4, FREE: FREE T4: 0.61 ng/dL (ref 0.60–1.60)

## 2017-12-18 LAB — TSH: TSH: 3.7 u[IU]/mL (ref 0.35–4.50)

## 2017-12-18 LAB — T3, FREE: T3 FREE: 3.7 pg/mL (ref 2.3–4.2)

## 2017-12-20 ENCOUNTER — Inpatient Hospital Stay (HOSPITAL_COMMUNITY): Payer: 59

## 2017-12-20 ENCOUNTER — Encounter (HOSPITAL_COMMUNITY): Payer: Self-pay

## 2017-12-20 ENCOUNTER — Inpatient Hospital Stay (HOSPITAL_COMMUNITY): Payer: 59 | Admitting: Anesthesiology

## 2017-12-20 ENCOUNTER — Inpatient Hospital Stay (HOSPITAL_COMMUNITY)
Admission: AD | Admit: 2017-12-20 | Discharge: 2017-12-23 | DRG: 805 | Disposition: A | Discharge status: Home / Self Care | Payer: 59 | Attending: Obstetrics and Gynecology | Admitting: Obstetrics and Gynecology

## 2017-12-20 DIAGNOSIS — Z3A32 32 weeks gestation of pregnancy: Secondary | ICD-10-CM

## 2017-12-20 DIAGNOSIS — J45909 Unspecified asthma, uncomplicated: Secondary | ICD-10-CM | POA: Diagnosis present

## 2017-12-20 DIAGNOSIS — O99284 Endocrine, nutritional and metabolic diseases complicating childbirth: Secondary | ICD-10-CM | POA: Diagnosis present

## 2017-12-20 DIAGNOSIS — O9952 Diseases of the respiratory system complicating childbirth: Secondary | ICD-10-CM | POA: Diagnosis present

## 2017-12-20 DIAGNOSIS — Z3A00 Weeks of gestation of pregnancy not specified: Secondary | ICD-10-CM | POA: Diagnosis not present

## 2017-12-20 DIAGNOSIS — O99214 Obesity complicating childbirth: Secondary | ICD-10-CM | POA: Diagnosis present

## 2017-12-20 DIAGNOSIS — K219 Gastro-esophageal reflux disease without esophagitis: Secondary | ICD-10-CM | POA: Diagnosis present

## 2017-12-20 DIAGNOSIS — O364XX0 Maternal care for intrauterine death, not applicable or unspecified: Principal | ICD-10-CM | POA: Diagnosis present

## 2017-12-20 DIAGNOSIS — O403XX0 Polyhydramnios, third trimester, not applicable or unspecified: Secondary | ICD-10-CM | POA: Diagnosis present

## 2017-12-20 DIAGNOSIS — E039 Hypothyroidism, unspecified: Secondary | ICD-10-CM | POA: Diagnosis present

## 2017-12-20 DIAGNOSIS — O289 Unspecified abnormal findings on antenatal screening of mother: Secondary | ICD-10-CM

## 2017-12-20 DIAGNOSIS — IMO0002 Reserved for concepts with insufficient information to code with codable children: Secondary | ICD-10-CM

## 2017-12-20 DIAGNOSIS — O2662 Liver and biliary tract disorders in childbirth: Secondary | ICD-10-CM | POA: Diagnosis present

## 2017-12-20 DIAGNOSIS — K831 Obstruction of bile duct: Secondary | ICD-10-CM | POA: Diagnosis present

## 2017-12-20 DIAGNOSIS — O9962 Diseases of the digestive system complicating childbirth: Secondary | ICD-10-CM | POA: Diagnosis present

## 2017-12-20 DIAGNOSIS — O09293 Supervision of pregnancy with other poor reproductive or obstetric history, third trimester: Secondary | ICD-10-CM | POA: Diagnosis not present

## 2017-12-20 DIAGNOSIS — O368130 Decreased fetal movements, third trimester, not applicable or unspecified: Secondary | ICD-10-CM | POA: Diagnosis present

## 2017-12-20 DIAGNOSIS — O2290 Venous complication in pregnancy, unspecified, unspecified trimester: Secondary | ICD-10-CM | POA: Diagnosis not present

## 2017-12-20 LAB — URINALYSIS, ROUTINE W REFLEX MICROSCOPIC
Bilirubin Urine: NEGATIVE
Glucose, UA: NEGATIVE mg/dL
HGB URINE DIPSTICK: NEGATIVE
Ketones, ur: NEGATIVE mg/dL
Leukocytes, UA: NEGATIVE
Nitrite: NEGATIVE
PH: 6 (ref 5.0–8.0)
Protein, ur: NEGATIVE mg/dL
SPECIFIC GRAVITY, URINE: 1.014 (ref 1.005–1.030)

## 2017-12-20 LAB — COMPREHENSIVE METABOLIC PANEL
ALT: 50 U/L — AB (ref 0–44)
ANION GAP: 10 (ref 5–15)
AST: 62 U/L — ABNORMAL HIGH (ref 15–41)
Albumin: 2.6 g/dL — ABNORMAL LOW (ref 3.5–5.0)
Alkaline Phosphatase: 150 U/L — ABNORMAL HIGH (ref 38–126)
BUN: 9 mg/dL (ref 6–20)
CHLORIDE: 107 mmol/L (ref 98–111)
CO2: 17 mmol/L — ABNORMAL LOW (ref 22–32)
CREATININE: 0.57 mg/dL (ref 0.44–1.00)
Calcium: 8.8 mg/dL — ABNORMAL LOW (ref 8.9–10.3)
GFR calc non Af Amer: >60 mL/min (ref >60)
Glucose, Bld: 111 mg/dL — ABNORMAL HIGH (ref 70–99)
POTASSIUM: 4.2 mmol/L (ref 3.5–5.1)
SODIUM: 134 mmol/L — AB (ref 135–145)
Total Bilirubin: 0.3 mg/dL (ref 0.3–1.2)
Total Protein: 6.1 g/dL — ABNORMAL LOW (ref 6.5–8.1)

## 2017-12-20 LAB — TYPE AND SCREEN
ABO/RH(D): O POS
ANTIBODY SCREEN: NEGATIVE

## 2017-12-20 LAB — CBC
HEMATOCRIT: 33.7 % — AB (ref 36.0–46.0)
HEMOGLOBIN: 11.1 g/dL — AB (ref 12.0–15.0)
MCH: 32.1 pg (ref 26.0–34.0)
MCHC: 32.9 g/dL (ref 30.0–36.0)
MCV: 97.4 fL (ref 78.0–100.0)
Platelets: 138 10*3/uL — ABNORMAL LOW (ref 150–400)
RBC: 3.46 MIL/uL — ABNORMAL LOW (ref 3.87–5.11)
RDW: 13.9 % (ref 11.5–15.5)
WBC: 9.3 10*3/uL (ref 4.0–10.5)

## 2017-12-20 LAB — PROTEIN / CREATININE RATIO, URINE
Creatinine, Urine: 79 mg/dL
PROTEIN CREATININE RATIO: 0.18 mg/mg{creat} — AB (ref 0.00–0.15)
TOTAL PROTEIN, URINE: 14 mg/dL

## 2017-12-20 LAB — TSH: TSH: 4.853 u[IU]/mL — AB (ref 0.350–4.500)

## 2017-12-20 LAB — ABO/RH: ABO/RH(D): O POS

## 2017-12-20 LAB — T4, FREE: Free T4: 0.72 ng/dL — ABNORMAL LOW (ref 0.82–1.77)

## 2017-12-20 MED ORDER — OXYCODONE-ACETAMINOPHEN 5-325 MG PO TABS: 2.0000 | ORAL_TABLET | ORAL | Status: DC | PRN

## 2017-12-20 MED ORDER — LEVOTHYROXINE SODIUM 100 MCG PO TABS
100.0000 ug | ORAL_TABLET | Freq: Every day | ORAL | Status: DC
  Administered 2017-12-21: 100 ug via ORAL
  Filled 2017-12-20 (×2): qty 1

## 2017-12-20 MED ORDER — FLUTICASONE PROPIONATE 50 MCG/ACT NA SUSP
2.0000 | Freq: Every day | NASAL | Status: DC
  Filled 2017-12-20: qty 16

## 2017-12-20 MED ORDER — BUTALBITAL-APAP-CAFFEINE 50-300-40 MG PO CAPS: 1.0000 | ORAL_CAPSULE | Freq: Three times a day (TID) | ORAL | Status: DC | PRN

## 2017-12-20 MED ORDER — LIDOCAINE HCL (PF) 1 % IJ SOLN
INTRAMUSCULAR | Status: DC | PRN
  Administered 2017-12-20 (×2): 5 mL via EPIDURAL

## 2017-12-20 MED ORDER — FENTANYL 2.5 MCG/ML BUPIVACAINE 1/10 % EPIDURAL INFUSION (WH - ANES)
14.0000 mL/h | INTRAMUSCULAR | Status: DC | PRN
  Administered 2017-12-20: 14 mL/h via EPIDURAL
  Filled 2017-12-20: qty 100

## 2017-12-20 MED ORDER — ONDANSETRON HCL 4 MG/2ML IJ SOLN: 4.0000 mg | Freq: Four times a day (QID) | INTRAMUSCULAR | Status: DC | PRN

## 2017-12-20 MED ORDER — ALBUTEROL SULFATE HFA 108 (90 BASE) MCG/ACT IN AERS: 2.0000 | INHALATION_SPRAY | Freq: Four times a day (QID) | RESPIRATORY_TRACT | Status: DC | PRN

## 2017-12-20 MED ORDER — LEVOTHYROXINE SODIUM 88 MCG PO TABS
88.0000 ug | ORAL_TABLET | Freq: Every day | ORAL | Status: DC
  Filled 2017-12-20: qty 1

## 2017-12-20 MED ORDER — EPHEDRINE 5 MG/ML INJ
10.0000 mg | INTRAVENOUS | Status: DC | PRN
  Filled 2017-12-20: qty 2

## 2017-12-20 MED ORDER — OXYTOCIN BOLUS FROM INFUSION
500.0000 mL | Freq: Once | INTRAVENOUS | Status: AC
  Administered 2017-12-22: 500 mL via INTRAVENOUS

## 2017-12-20 MED ORDER — PHENYLEPHRINE 40 MCG/ML (10ML) SYRINGE FOR IV PUSH (FOR BLOOD PRESSURE SUPPORT)
80.0000 ug | PREFILLED_SYRINGE | INTRAVENOUS | Status: DC | PRN
  Filled 2017-12-20: qty 10
  Filled 2017-12-20: qty 5

## 2017-12-20 MED ORDER — ZOLPIDEM TARTRATE 5 MG PO TABS
5.0000 mg | ORAL_TABLET | Freq: Every evening | ORAL | Status: DC | PRN
  Administered 2017-12-21: 5 mg via ORAL
  Filled 2017-12-20 (×2): qty 1

## 2017-12-20 MED ORDER — OLOPATADINE HCL 0.1 % OP SOLN: 1.0000 [drp] | Freq: Two times a day (BID) | OPHTHALMIC | Status: DC

## 2017-12-20 MED ORDER — MISOPROSTOL 200 MCG PO TABS
200.0000 ug | ORAL_TABLET | ORAL | Status: AC | PRN
  Administered 2017-12-20 (×3): 200 ug via VAGINAL
  Filled 2017-12-20 (×3): qty 1

## 2017-12-20 MED ORDER — LORATADINE 10 MG PO TABS
10.0000 mg | ORAL_TABLET | Freq: Every day | ORAL | Status: DC
  Administered 2017-12-21: 10 mg via ORAL
  Filled 2017-12-20 (×3): qty 1

## 2017-12-20 MED ORDER — FAMOTIDINE IN NACL 20-0.9 MG/50ML-% IV SOLN
20.0000 mg | Freq: Two times a day (BID) | INTRAVENOUS | Status: DC
Start: 2017-12-20 — End: 2017-12-22
  Administered 2017-12-20 – 2017-12-21 (×3): 20 mg via INTRAVENOUS
  Filled 2017-12-20 (×4): qty 50

## 2017-12-20 MED ORDER — ACETAMINOPHEN 325 MG PO TABS
650.0000 mg | ORAL_TABLET | ORAL | Status: DC | PRN
  Administered 2017-12-20 – 2017-12-21 (×6): 650 mg via ORAL
  Filled 2017-12-20 (×6): qty 2

## 2017-12-20 MED ORDER — LACTATED RINGERS IV SOLN: 500.0000 mL | Freq: Once | INTRAVENOUS | Status: DC

## 2017-12-20 MED ORDER — LACTATED RINGERS IV SOLN
INTRAVENOUS | Status: DC
  Administered 2017-12-20 – 2017-12-21 (×4): via INTRAVENOUS

## 2017-12-20 MED ORDER — MONTELUKAST SODIUM 10 MG PO TABS
10.0000 mg | ORAL_TABLET | Freq: Every day | ORAL | Status: DC
  Administered 2017-12-21: 10 mg via ORAL
  Filled 2017-12-20 (×3): qty 1

## 2017-12-20 MED ORDER — SODIUM CHLORIDE 0.9 % IV SOLN
2.0000 g | Freq: Four times a day (QID) | INTRAVENOUS | Status: DC
  Administered 2017-12-20 – 2017-12-21 (×5): 2 g via INTRAVENOUS
  Filled 2017-12-20: qty 2000
  Filled 2017-12-20 (×2): qty 2
  Filled 2017-12-20: qty 2000
  Filled 2017-12-20: qty 2
  Filled 2017-12-20: qty 2000
  Filled 2017-12-20 (×2): qty 2

## 2017-12-20 MED ORDER — SODIUM CHLORIDE 0.9 % IV SOLN
14.0000 mL/h | INTRAVENOUS | Status: DC | PRN
  Administered 2017-12-20 – 2017-12-21 (×5): 14 mL/h via EPIDURAL
  Filled 2017-12-20 (×9): qty 17

## 2017-12-20 MED ORDER — URSODIOL 500 MG PO TABS
500.0000 mg | ORAL_TABLET | Freq: Two times a day (BID) | ORAL | Status: DC
  Administered 2017-12-20 – 2017-12-21 (×3): 500 mg via ORAL

## 2017-12-20 MED ORDER — DEXTROSE 5 % IV SOLN
210.0000 mg | Freq: Three times a day (TID) | INTRAVENOUS | Status: DC
Start: 2017-12-20 — End: 2017-12-22
  Administered 2017-12-21 – 2017-12-22 (×4): 210 mg via INTRAVENOUS
  Filled 2017-12-20 (×5): qty 5.25

## 2017-12-20 MED ORDER — DIPHENHYDRAMINE HCL 50 MG/ML IJ SOLN
12.5000 mg | INTRAMUSCULAR | Status: AC | PRN
  Administered 2017-12-20 (×3): 12.5 mg via INTRAVENOUS
  Filled 2017-12-20 (×2): qty 1

## 2017-12-20 MED ORDER — BUTALBITAL-APAP-CAFFEINE 50-325-40 MG PO TABS
1.0000 | ORAL_TABLET | Freq: Three times a day (TID) | ORAL | Status: DC | PRN
  Administered 2017-12-20: 1 via ORAL
  Filled 2017-12-20: qty 1

## 2017-12-20 MED ORDER — PANTOPRAZOLE SODIUM 40 MG PO TBEC
40.0000 mg | DELAYED_RELEASE_TABLET | Freq: Every day | ORAL | Status: DC
  Administered 2017-12-21: 40 mg via ORAL
  Filled 2017-12-20: qty 1

## 2017-12-20 MED ORDER — LIDOCAINE HCL (PF) 1 % IJ SOLN
30.0000 mL | INTRAMUSCULAR | Status: DC | PRN
  Administered 2017-12-22: 30 mL via SUBCUTANEOUS
  Filled 2017-12-20: qty 30

## 2017-12-20 MED ORDER — SOD CITRATE-CITRIC ACID 500-334 MG/5ML PO SOLN: 30.0000 mL | ORAL | Status: DC | PRN

## 2017-12-20 MED ORDER — DIPHENHYDRAMINE HCL 25 MG PO TABS
25.0000 mg | ORAL_TABLET | Freq: Every evening | ORAL | Status: DC | PRN
  Filled 2017-12-20: qty 1

## 2017-12-20 MED ORDER — OXYTOCIN 40 UNITS IN LACTATED RINGERS INFUSION - SIMPLE MED
2.5000 [IU]/h | INTRAVENOUS | Status: DC
  Filled 2017-12-20: qty 1000

## 2017-12-20 MED ORDER — OXYTOCIN 40 UNITS IN LACTATED RINGERS INFUSION - SIMPLE MED
1.0000 m[IU]/min | INTRAVENOUS | Status: DC
  Administered 2017-12-21: 2 m[IU]/min via INTRAVENOUS
  Filled 2017-12-20: qty 1000

## 2017-12-20 MED ORDER — MOMETASONE FURO-FORMOTEROL FUM 200-5 MCG/ACT IN AERO
2.0000 | INHALATION_SPRAY | Freq: Two times a day (BID) | RESPIRATORY_TRACT | Status: DC
  Administered 2017-12-20 – 2017-12-21 (×3): 2 via RESPIRATORY_TRACT
  Filled 2017-12-20: qty 8.8

## 2017-12-20 MED ORDER — OXYCODONE-ACETAMINOPHEN 5-325 MG PO TABS: 1.0000 | ORAL_TABLET | ORAL | Status: DC | PRN

## 2017-12-20 MED ORDER — PROMETHAZINE HCL 25 MG/ML IJ SOLN
25.0000 mg | Freq: Four times a day (QID) | INTRAMUSCULAR | Status: DC | PRN
  Administered 2017-12-20 – 2017-12-21 (×2): 25 mg via INTRAVENOUS
  Filled 2017-12-20 (×2): qty 1

## 2017-12-20 MED ORDER — LAMOTRIGINE 100 MG PO TABS
100.0000 mg | ORAL_TABLET | Freq: Every day | ORAL | Status: DC
  Administered 2017-12-21: 100 mg via ORAL
  Filled 2017-12-20 (×2): qty 1

## 2017-12-20 MED ORDER — ALBUTEROL SULFATE (2.5 MG/3ML) 0.083% IN NEBU: 2.5000 mg | INHALATION_SOLUTION | Freq: Four times a day (QID) | RESPIRATORY_TRACT | Status: DC | PRN

## 2017-12-20 MED ORDER — LACTATED RINGERS IV SOLN: 500.0000 mL | INTRAVENOUS | Status: DC | PRN

## 2017-12-20 MED ORDER — IBUPROFEN 800 MG PO TABS
800.0000 mg | ORAL_TABLET | Freq: Once | ORAL | Status: AC
  Administered 2017-12-20: 800 mg via ORAL
  Filled 2017-12-20: qty 1

## 2017-12-20 MED ORDER — PHENYLEPHRINE 40 MCG/ML (10ML) SYRINGE FOR IV PUSH (FOR BLOOD PRESSURE SUPPORT)
80.0000 ug | PREFILLED_SYRINGE | INTRAVENOUS | Status: DC | PRN
  Filled 2017-12-20: qty 5

## 2017-12-20 NOTE — Progress Notes (Signed)
Vitals:   12/20/17 2023 12/20/17 2053 12/20/17 2123 12/20/17 2215  BP:   95/65   Pulse:   (!) 139   Resp:   18   Temp: (!) 102.6 F (39.2 C) (!) 102.2 F (39 C)  (!) 102.1 F (38.9 C)  TempSrc: Oral Oral  Oral  SpO2:      Weight:      Height:       VTX by [place].  I d/w pt options of chromosomal analysis, autopsy post birth.  I also d/w her possible need for D&C if placenta did not come out within a reasonable time post birth.  The nurse has d/w pt about the option of seeing the baby post birth.

## 2017-12-20 NOTE — H&P (Signed)
27 y.o. [redacted]w[redacted]d G2P0010 comes in c/o no fetal movement since yesterday pm.   No bleeding.  FHTs were not able to be gotten and [facility] confirmed IUFD.  Past Medical History:  Diagnosis Date  . Allergic rhinitis    remote hx of allergy shots  . Asthma    since childhood  on controller meds extrinsic dr kCarmelina Peal . GERD (gastroesophageal reflux disease)    on nexium  for  long term sx since childhood  . H/O miscarriage, not currently pregnant    [redacted] weeks  march 2016  . Hypothyroidism   . Migraine   . Murmur    pt reports MVP  . Syncope    under eval ? cause  . Tachycardia    episodes with near syncope eval dr GNadyne Coombes on no meds dced LABA    Past Surgical History:  Procedure Laterality Date  . broken right femur  2008   rod placed  . DILATION AND CURETTAGE OF UTERUS    . TONSILLECTOMY  2006  . WISDOM TOOTH EXTRACTION      OB History  Gravida Para Term Preterm AB Living  2       1 0  SAB TAB Ectopic Multiple Live Births  1            # Outcome Date GA Lbr Len/2nd Weight Sex Delivery Anes PTL Lv  2 Current           1 SAB 08/15/14     Vag-Spont       Social History   Socioeconomic History  . Marital status: Divorced    Spouse name: Not on file  . Number of children: Not on file  . Years of education: Not on file  . Highest education level: Not on file  Occupational History  . Not on file  Social Needs  . Financial resource strain: Not on file  . Food insecurity:    Worry: Not on file    Inability: Not on file  . Transportation needs:    Medical: Not on file    Non-medical: Not on file  Tobacco Use  . Smoking status: Never Smoker  . Smokeless tobacco: Never Used  Substance and Sexual Activity  . Alcohol use: No    Alcohol/week: 0.0 oz    Frequency: Never  . Drug use: No  . Sexual activity: Yes    Birth control/protection: None  Lifestyle  . Physical activity:    Days per week: Not on file    Minutes per session: Not on file  . Stress: Not on file   Relationships  . Social connections:    Talks on phone: Not on file    Gets together: Not on file    Attends religious service: Not on file    Active member of club or organization: Not on file    Attends meetings of clubs or organizations: Not on file    Relationship status: Not on file  . Intimate partner violence:    Fear of current or ex partner: Not on file    Emotionally abused: Not on file    Physically abused: Not on file    Forced sexual activity: Not on file  Other Topics Concern  . 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 Hypothyroid- pt on levothyroxine and last TSH was normal end of May.  2.  SVT- pt is for possible ablation post pregnancy.  Cards following.   3.  Asthma-  4. Cholestasis of pregnancy diagnosed at 30 weeks.  Pt on ursodiol and weekly ANT testing.  5.  Polyhydramnios dx at 29 weeks.  Pt sent to MFM for anatomy f/u- 2 v cord and AFI 38 (EFW was 58%ile).  F/u AFI was 41 and EFW was 76%ile.  Final AFI/BPP on 7-2 was 34.6 and 8/8. MFM anatomy scan was otherwise complete and normal.   6. Was initally seeing MFM for low fetal fraction on Natera.  Pt declined further genetic testing.   7. Probably prediabetes and insulin resistance.  Pt passed early 3 hour GTT.  Was checking sugars and initially started on Glyburide but stopped for sugars in 40s.  Pt changed diet and reported sugars in range since 25 weeks.   Vitals:   12/20/17 0932  BP: 106/75  Pulse: (!) 136  Resp: 19  Temp: 98.9 F (37.2 C)  TempSrc: Oral  SpO2: 98%  Weight: (!) 351 lb (159.2 kg)    Lungs/Cor:  NAD Abdomen:  soft, gravid Ex:  no cords, erythema SVE:  Deferred for now.     A/P   G2P0010 32w6dwith IUFD.  D/w pt that best course of action is for  induction of labor with cytotec and pitocin.  Pt may have epidural immediately.  D/w pt risks to future pregnancies if C/S.   Blood type is O+. Will check TFTs, glucose = and LFTs for hypothyroidism, borderline DM and cholestasis.   Will d/w pt that at least chromosomes if not autopsy of baby may help with diagnosis of problem since baby had poly and 2v cord.     Linda Becker A

## 2017-12-20 NOTE — MAU Note (Addendum)
Has not felt adequate movement since 07/04, felt a little bit of movement yesterday morning but none since.   Some back pain, no bleeding, no LOF.  Doppler heart tones attempted in triage and none heard. Brought patient back to room 6 and attempted to apply EFM. [place], toco, and pulse ox applied, maternal tracing. Called Rogers CNM to bedside for ultrasound.

## 2017-12-20 NOTE — Anesthesia Procedure Notes (Signed)
Epidural Patient location during procedure: OB Start time: 12/20/2017 2:55 PM End time: 12/20/2017 3:02 PM  Staffing Anesthesiologist: Albertha Ghee, MD Performed: anesthesiologist   Preanesthetic Checklist Completed: patient identified, site marked, pre-op evaluation, timeout performed, IV checked, risks and benefits discussed and monitors and equipment checked  Epidural Patient position: sitting Prep: DuraPrep Patient monitoring: heart rate, cardiac monitor, continuous pulse ox and blood pressure Approach: midline Location: L2-L3 Injection technique: LOR air  Needle:  Needle type: Tuohy  Needle gauge: 17 G Needle length: 15 cm Needle insertion depth: 7 cm Catheter type: closed end flexible Catheter size: 19 Gauge Catheter at skin depth: 12 cm Test dose: negative and Other  Assessment Events: blood not aspirated, injection not painful, no injection resistance and negative IV test  Additional Notes Informed consent obtained prior to proceeding including risk of failure, 1% risk of PDPH, risk of minor discomfort and bruising.  Discussed rare but serious complications including epidural abscess, permanent nerve injury, epidural hematoma.  Discussed alternatives to epidural analgesia and patient desires to proceed.  Timeout performed pre-procedure verifying patient name, procedure, and platelet count.  Patient tolerated procedure well. Reason for block:procedure for pain

## 2017-12-20 NOTE — Anesthesia Preprocedure Evaluation (Addendum)
Anesthesia Evaluation  Patient identified by MRN, date of birth, ID band Patient awake    Reviewed: Allergy & Precautions, H&P , NPO status , Patient's Chart, lab work & pertinent test results  Airway Mallampati: II   Neck ROM: full    Dental   Pulmonary asthma ,    breath sounds clear to auscultation       Cardiovascular + dysrhythmias Supra Ventricular Tachycardia  Rhythm:regular Rate:Normal     Neuro/Psych  Headaches,    GI/Hepatic GERD  ,  Endo/Other  Hypothyroidism Morbid obesity  Renal/GU      Musculoskeletal   Abdominal   Peds  Hematology   Anesthesia Other Findings   Reproductive/Obstetrics Current IUFD @32  wks                            Anesthesia Physical Anesthesia Plan  ASA: II  Anesthesia Plan: Epidural   Post-op Pain Management:    Induction: Intravenous  PONV Risk Score and Plan: 2 and Treatment may vary due to age or medical condition  Airway Management Planned: Natural Airway  Additional Equipment:   Intra-op Plan:   Post-operative Plan:   Informed Consent: I have reviewed the patients History and Physical, chart, labs and discussed the procedure including the risks, benefits and alternatives for the proposed anesthesia with the patient or authorized representative who has indicated his/her understanding and acceptance.     Plan Discussed with: Anesthesiologist  Anesthesia Plan Comments:         Anesthesia Quick Evaluation

## 2017-12-20 NOTE — Progress Notes (Signed)
Pt now with fever.  Amp given.  Vitals:   12/20/17 1802 12/20/17 1810 12/20/17 1842 12/20/17 1917  BP: 104/82  118/81   Pulse: (!) 121  (!) 150   Resp: 18  18   Temp:  (!) 101.6 F (38.7 C)  (!) 102.4 F (39.1 C)  TempSrc:  Oral  Axillary  SpO2:      Weight:      Height:       Results for orders placed or performed during the hospital encounter of 12/20/17 (from the past 24 hour(s))  T4, free     Status: Abnormal   Collection Time: 12/20/17 10:35 AM  Result Value Ref Range   Free T4 0.72 (L) 0.82 - 1.77 ng/dL  TSH     Status: Abnormal   Collection Time: 12/20/17 10:35 AM  Result Value Ref Range   TSH 4.853 (H) 0.350 - 4.500 uIU/mL  CBC     Status: Abnormal   Collection Time: 12/20/17 10:35 AM  Result Value Ref Range   WBC 9.3 4.0 - 10.5 K/uL   RBC 3.46 (L) 3.87 - 5.11 MIL/uL   Hemoglobin 11.1 (L) 12.0 - 15.0 g/dL   HCT 33.7 (L) 36.0 - 46.0 %   MCV 97.4 78.0 - 100.0 fL   MCH 32.1 26.0 - 34.0 pg   MCHC 32.9 30.0 - 36.0 g/dL   RDW 13.9 11.5 - 15.5 %   Platelets 138 (L) 150 - 400 K/uL  Type and screen [place]     Status: None   Collection Time: 12/20/17 10:35 AM  Result Value Ref Range   ABO/RH(D) O POS    Antibody Screen NEG    Sample Expiration      12/23/2017 Performed at [facility], [address] [place], [place] [address]   Comprehensive metabolic panel     Status: Abnormal   Collection Time: 12/20/17 10:35 AM  Result Value Ref Range   Sodium 134 (L) 135 - 145 mmol/L   Potassium 4.2 3.5 - 5.1 mmol/L   Chloride 107 98 - 111 mmol/L   CO2 17 (L) 22 - 32 mmol/L   Glucose, Bld 111 (H) 70 - 99 mg/dL   BUN 9 6 - 20 mg/dL   Creatinine, Ser 0.57 0.44 - 1.00 mg/dL   Calcium 8.8 (L) 8.9 - 10.3 mg/dL   Total Protein 6.1 (L) 6.5 - 8.1 g/dL   Albumin 2.6 (L) 3.5 - 5.0 g/dL   AST 62 (H) 15 - 41 U/L   ALT 50 (H) 0 - 44 U/L   Alkaline Phosphatase 150 (H) 38 - 126 U/L   Total Bilirubin 0.3 0.3 - 1.2 mg/dL   GFR calc non Af Amer >60 >60  mL/min   GFR calc Af Amer >60 >60 mL/min   Anion gap 10 5 - 15  ABO/Rh     Status: None   Collection Time: 12/20/17 10:35 AM  Result Value Ref Range   ABO/RH(D)      O POS Performed at [facility], [place], [place] [address]   Urinalysis, Routine w reflex microscopic     Status: Abnormal   Collection Time: 12/20/17  2:10 PM  Result Value Ref Range   Color, Urine YELLOW YELLOW   APPearance HAZY (A) CLEAR   Specific Gravity, Urine 1.014 1.005 - 1.030   pH 6.0 5.0 - 8.0   Glucose, UA NEGATIVE NEGATIVE mg/dL   Hgb urine dipstick NEGATIVE NEGATIVE   Bilirubin  Urine NEGATIVE NEGATIVE   Ketones, ur NEGATIVE NEGATIVE mg/dL   Protein, ur NEGATIVE NEGATIVE mg/dL   Nitrite NEGATIVE NEGATIVE   Leukocytes, UA NEGATIVE NEGATIVE  Protein / creatinine ratio, urine     Status: Abnormal   Collection Time: 12/20/17  2:10 PM  Result Value Ref Range   Creatinine, Urine 79.00 mg/dL   Total Protein, Urine 14 mg/dL   Protein Creatinine Ratio 0.18 (H) 0.00 - 0.15 mg/mg[Cre]    No signs of HELLP.TFTS are showing low thyroid- will adjust dose.   Amp started for fevers.

## 2017-12-20 NOTE — MAU Note (Signed)
0909-formal bedside ultrasound; Dr. Philis Pique present at bedside

## 2017-12-21 ENCOUNTER — Encounter: Payer: Self-pay | Admitting: Internal Medicine

## 2017-12-21 LAB — RPR: RPR: NONREACTIVE

## 2017-12-21 MED ORDER — FENTANYL CITRATE (PF) 100 MCG/2ML IJ SOLN
INTRAMUSCULAR | Status: AC
  Filled 2017-12-21: qty 2

## 2017-12-21 MED ORDER — LIDOCAINE HCL URETHRAL/MUCOSAL 2 % EX GEL
1.0000 "application " | Freq: Once | CUTANEOUS | Status: AC
  Administered 2017-12-21: 1 via URETHRAL
  Filled 2017-12-21: qty 5

## 2017-12-21 MED ORDER — ZOLPIDEM TARTRATE 5 MG PO TABS
5.0000 mg | ORAL_TABLET | Freq: Once | ORAL | Status: AC
  Administered 2017-12-21: 5 mg via ORAL

## 2017-12-21 MED ORDER — FENTANYL CITRATE (PF) 100 MCG/2ML IJ SOLN
INTRAMUSCULAR | Status: DC | PRN
  Administered 2017-12-21: 50 ug via EPIDURAL

## 2017-12-21 MED ORDER — LORAZEPAM 2 MG/ML IJ SOLN
2.0000 mg | INTRAMUSCULAR | Status: DC | PRN
  Administered 2017-12-21 (×2): 2 mg via INTRAVENOUS
  Filled 2017-12-21 (×4): qty 1

## 2017-12-21 MED ORDER — CLINDAMYCIN PHOSPHATE 900 MG/50ML IV SOLN
900.0000 mg | Freq: Three times a day (TID) | INTRAVENOUS | Status: DC
  Administered 2017-12-21: 900 mg via INTRAVENOUS
  Filled 2017-12-21 (×2): qty 50

## 2017-12-21 MED ORDER — BUPIVACAINE HCL (PF) 0.25 % IJ SOLN
INTRAMUSCULAR | Status: DC | PRN
  Administered 2017-12-21 (×2): 5 mL via EPIDURAL

## 2017-12-21 MED ORDER — LOPERAMIDE HCL 2 MG PO CAPS
4.0000 mg | ORAL_CAPSULE | ORAL | Status: DC | PRN
  Administered 2017-12-21: 4 mg via ORAL
  Filled 2017-12-21 (×2): qty 2

## 2017-12-21 MED ORDER — ZOLPIDEM TARTRATE 5 MG PO TABS
5.0000 mg | ORAL_TABLET | Freq: Once | ORAL | Status: AC
  Administered 2017-12-21: 5 mg via ORAL
  Filled 2017-12-21: qty 1

## 2017-12-21 NOTE — Progress Notes (Deleted)
Patient ID: Linda Becker, female   DOB: 01/08/1991, 27 y.o.   MRN: [id number]             Reason for Appointment:  Hypothyroidism, follow-up visit    History of Present Illness:    Hypothyroidism was first diagnosed in 10/2016  At the time of diagnosis patient was having symptoms of  fatigue, cold sensitivity, weight gain, brittle nails and hair loss .          However her symptoms had been present for several years and not any worse She was tested with her TSH because of needing to start lithium therapy Baseline TSH was 6.7, no baseline free T4 or free T3 available  Prior to consultation she had been treated with  liothyronine, initially 25 g daily and then 50 g by her psychiatrist for treatment of her high TSH The patient did not subjectively feeling any better with taking the liothyronine; with this she was having palpitations, shortness of breath and feeling excessively warm and sweaty Because of her symptoms she was told to stop the liothyronine and have baseline labs done again  RECENT history:  She had been observed without any supplementation prior to her pregnancy and she was only mildly symptomatic Her highest TSH recently has been 5.2  Because of her early pregnancy her gynecologist recommended levothyroxine supplementation  Last visit in 2/19 her TSH was 3.4 She was having nonspecific symptoms of headaches, alternating heat and cold intolerance and morning sickness at that time  Because of her advancing pregnancy her dose was increased up to 88 mcg She is thinks that she is occasionally having palpitations but this is mostly in the morning when she wakes up or with increased activity Has other symptoms such as headaches and sweating at times  Currently her TSH is improved at 1.77 with relatively low free T4  She is taking her levothyroxine consistently before breakfast         Patient's weight history is as follows:  Wt Readings from Last 3 Encounters:    12/20/17 (!) 351 lb (159.2 kg)  12/15/17 (!) 351 lb 3.2 oz (159.3 kg)  12/11/17 (!) 348 lb 12 oz (158.2 kg)    TPO antibody is negative  Thyroid function results have been as follows:  Lab Results  Component Value Date   TSH 4.853 (H) 12/20/2017   TSH 3.70 12/18/2017   TSH 1.77 10/27/2017   TSH 3.40 08/04/2017   FREET4 0.72 (L) 12/20/2017   FREET4 0.61 12/18/2017   FREET4 0.65 10/27/2017   FREET4 0.91 06/29/2017   T3FREE 3.7 12/18/2017   T3FREE 3.5 06/29/2017    02/05/17: Free thyroxine index low at 1.0   Past Medical History:  Diagnosis Date  . Allergic rhinitis    remote hx of allergy shots  . Asthma    since childhood  on controller meds extrinsic dr Carmelina Peal  . GERD (gastroesophageal reflux disease)    on nexium  for  long term sx since childhood  . H/O miscarriage, not currently pregnant    [redacted] weeks  march 2016  . Hypothyroidism   . Migraine   . Murmur    pt reports MVP  . Syncope    under eval ? cause  . Tachycardia    episodes with near syncope eval dr Nadyne Coombes  on no meds dced LABA    Past Surgical History:  Procedure Laterality Date  . broken right femur  2008   rod placed  . DILATION  AND CURETTAGE OF UTERUS    . OB ultrasound N/A 12/01/2017   see report  . TONSILLECTOMY  2006  . WISDOM TOOTH EXTRACTION      Family History  Problem Relation Age of Onset  . Asthma Father   . Arthritis Father   . Hyperlipidemia Father   . Heart disease Father   . Hypertension Father   . Diabetes Father   . Arthritis Mother   . Hyperlipidemia Mother   . Heart disease Mother   . Hypertension Mother   . Diabetes Mother   . Arthritis Maternal Grandmother   . Breast cancer Maternal Grandmother   . Thyroid disease Maternal Grandmother   . Colon cancer Paternal Grandfather   . Diabetes Paternal Grandfather   . Breast cancer Paternal Grandmother   . Diabetes Paternal Grandmother   . Breast cancer Paternal Aunt   . Breast cancer Paternal Aunt   . Diabetes  Maternal Grandfather     Social History:  reports that she has never smoked. She has never used smokeless tobacco. She reports that she does not drink alcohol or use drugs.  Allergies:  Allergies  Allergen Reactions  . Progesterone Rash    Was in a form of birth control.    Allergies as of 12/22/2017      Reactions   Progesterone Rash   Was in a form of birth control.      Medication List    Notice   This visit is during an admission. Changes to the med list made in this visit will be reflected in the After Visit Summary of the admission.        Review of Systems   She says she has been tested for gestational diabetes but this has not been confirmed with either a 3-hour glucose tolerance test or A1c, the latter is below 5 however she thinks her blood sugars that are being checked at home range between 50 and 200 Has not had a consultation with dietitian She is going to get a 1 hour glucose tolerance test next week with her gynecologist        Examination:    LMP 05/04/2017   She looks well    Assessment:  HYPOTHYROIDISM, MILD primary without a goiter  Currently the patient is taking 88 mg of levothyroxine, the dose was increased on her last visit  She is having nonspecific physical symptoms which are unrelated such as headaches and occasional sweating, having difficulty sleeping well and having fatigue because of her pregnancy also TSH is quite normal at 1.77  ?  Gestational diabetes: Not clear what her picture is with reportedly high blood sugars after meals but normal A1c is going to follow-up with her gynecologist next week   PLAN:   She will continue 88 mcg of levothyroxine  Discussed that if she is having high readings after meals she needs to see a dietitian first and she can discuss this with her gynecologist Also she ask her gynecologist if they want to request a diabetes consultation here  Follow-up in 7 weeks again   Elayne Snare 12/21/2017, 9:41  PM    Note: This office note was prepared with Dragon voice recognition system technology. Any transcriptional errors that result from this process are unintentional.

## 2017-12-21 NOTE — Progress Notes (Signed)
Initial visit with [name] and her partner and mother to introduce spiritual care services and offer support regarding the death of her baby, Gwyndolyn Saxon.  Laquitta is very uncomfortable in her labor and is "ready for this to be over"  She shared that she just wants to go home.  She was grieving appropriately during our conversation as she was occassionally tearful.  We processed her feelings about her upcoming delivery and her expectations.  She said she hopes to donate her colostrum and William's cord blood. [name] shared that she knows she wants to [employer], but doesn't want to hold him when he is "blue and cold."  We talked a bit about what she can expect and the possibility that she may still find joy in meeting Gwyndolyn Saxon even though it won't be what she had hoped for.  [name] shared that she knows she wants to bury [facility] and have a service, but doesn't know where to begin.  I informed her that we can support her in that process and we'll let her lead the way with how fast we proceed.  I offered to bring her the [facility]rieving [facility]aling brochure, which she would like.  Please page as further needs arise.  Donald Prose. Elyn Peers, M.Div. [facility] Chaplain Pager [phone] Office [phone]

## 2017-12-21 NOTE — Progress Notes (Addendum)
Pharmacy Antibiotic Note  Linda Becker is a 27 y.o. female admitted on 12/20/2017 with IUFD. Now with maternal fever and presumed chorioamnionitis.  Pharmacy has been consulted for gentamicin dosing.   Plan: gentamicin 210 mg IV Q8 hours  Height: 5' 6"  (167.6 cm) Weight: (!) 351 lb (159.2 kg) IBW/kg (Calculated) : 59.3  Temp (24hrs), Avg:101.2 F (38.4 C), Min:98.6 F (37 C), Max:102.6 F (39.2 C)  Recent Labs  Lab 12/20/17 1035  WBC 9.3  CREATININE 0.57    Estimated Creatinine Clearance: 168.5 mL/min (by C-G formula based on SCr of 0.57 mg/dL).    Allergies  Allergen Reactions  . Progesterone Rash    Was in a form of birth control.    Antimicrobials this admission: Amp 2g Q6 7/8 >>    Will follow SCr and check gentamicin levels as clinically indicated.  Thank you for allowing pharmacy to be a part of this patient's care.  Nyra Capes 12/21/2017 12:48 AM

## 2017-12-21 NOTE — Progress Notes (Signed)
Follow up check in with Linda Becker and her mother to deliver the heartstrings pamphlet about making choices during fetal loss.  Linda Becker continues to be exhausted and eager for labor to be over.  Upon entering the room, Linda Becker appeared to be resting and moaned when she heard the door open.  I offered to leave the brochure and let her rest, for which she was grateful.    Please page as further needs arise.  Donald Prose. Elyn Peers, M.Div. [facility] Chaplain Pager [phone] Office [phone]      12/21/17 1229  Clinical Encounter Type  Visited With Patient and family together  Visit Type Follow-up  Referral From Nurse  Spiritual Encounters  Spiritual Needs Grief support  Stress Factors  Patient Stress Factors Loss

## 2017-12-22 ENCOUNTER — Other Ambulatory Visit: Payer: Self-pay

## 2017-12-22 ENCOUNTER — Encounter (HOSPITAL_COMMUNITY): Payer: Self-pay | Admitting: *Deleted

## 2017-12-22 ENCOUNTER — Ambulatory Visit: Payer: 59 | Admitting: Endocrinology

## 2017-12-22 ENCOUNTER — Ambulatory Visit (HOSPITAL_COMMUNITY): Admission: RE | Admit: 2017-12-22 | Payer: 59 | Source: Ambulatory Visit

## 2017-12-22 DIAGNOSIS — Z0289 Encounter for other administrative examinations: Secondary | ICD-10-CM

## 2017-12-22 LAB — CBC
HCT: 33.3 % — ABNORMAL LOW (ref 36.0–46.0)
HEMOGLOBIN: 11 g/dL — AB (ref 12.0–15.0)
MCH: 32.4 pg (ref 26.0–34.0)
MCHC: 33 g/dL (ref 30.0–36.0)
MCV: 97.9 fL (ref 78.0–100.0)
Platelets: 180 10*3/uL (ref 150–400)
RBC: 3.4 MIL/uL — AB (ref 3.87–5.11)
RDW: 14.1 % (ref 11.5–15.5)
WBC: 17.4 10*3/uL — AB (ref 4.0–10.5)

## 2017-12-22 MED ORDER — ZOLPIDEM TARTRATE 5 MG PO TABS: 5.0000 mg | ORAL_TABLET | Freq: Every evening | ORAL | Status: DC | PRN

## 2017-12-22 MED ORDER — WITCH HAZEL-GLYCERIN EX PADS: 1.0000 "application " | MEDICATED_PAD | CUTANEOUS | Status: DC | PRN

## 2017-12-22 MED ORDER — OXYCODONE-ACETAMINOPHEN 5-325 MG PO TABS
2.0000 | ORAL_TABLET | ORAL | Status: DC | PRN
  Administered 2017-12-22 – 2017-12-23 (×5): 2 via ORAL
  Filled 2017-12-22 (×5): qty 2

## 2017-12-22 MED ORDER — PRENATAL MULTIVITAMIN CH
1.0000 | ORAL_TABLET | Freq: Every day | ORAL | Status: DC
  Administered 2017-12-22: 1 via ORAL
  Filled 2017-12-22: qty 1

## 2017-12-22 MED ORDER — COCONUT OIL OIL: 1.0000 "application " | TOPICAL_OIL | Status: DC | PRN

## 2017-12-22 MED ORDER — PROMETHAZINE HCL 25 MG PO TABS
25.0000 mg | ORAL_TABLET | Freq: Four times a day (QID) | ORAL | Status: DC | PRN
  Administered 2017-12-22 – 2017-12-23 (×3): 25 mg via ORAL
  Filled 2017-12-22 (×3): qty 1

## 2017-12-22 MED ORDER — ALPRAZOLAM 0.5 MG PO TABS
0.5000 mg | ORAL_TABLET | Freq: Three times a day (TID) | ORAL | Status: DC | PRN
  Administered 2017-12-22: 0.5 mg via ORAL
  Filled 2017-12-22: qty 1

## 2017-12-22 MED ORDER — OXYCODONE-ACETAMINOPHEN 5-325 MG PO TABS
1.0000 | ORAL_TABLET | ORAL | Status: DC | PRN
  Administered 2017-12-23: 1 via ORAL
  Filled 2017-12-22: qty 1

## 2017-12-22 MED ORDER — IBUPROFEN 600 MG PO TABS
600.0000 mg | ORAL_TABLET | Freq: Four times a day (QID) | ORAL | Status: DC
  Administered 2017-12-22 – 2017-12-23 (×6): 600 mg via ORAL
  Filled 2017-12-22 (×6): qty 1

## 2017-12-22 MED ORDER — SENNOSIDES-DOCUSATE SODIUM 8.6-50 MG PO TABS
2.0000 | ORAL_TABLET | ORAL | Status: DC
  Administered 2017-12-22: 2 via ORAL
  Filled 2017-12-22: qty 2

## 2017-12-22 MED ORDER — ACETAMINOPHEN 325 MG PO TABS: 650.0000 mg | ORAL_TABLET | ORAL | Status: DC | PRN

## 2017-12-22 MED ORDER — BENZOCAINE-MENTHOL 20-0.5 % EX AERO
1.0000 "application " | INHALATION_SPRAY | CUTANEOUS | Status: DC | PRN
  Administered 2017-12-22: 1 via TOPICAL
  Filled 2017-12-22: qty 56

## 2017-12-22 MED ORDER — METHYLERGONOVINE MALEATE 0.2 MG PO TABS: 0.2000 mg | ORAL_TABLET | ORAL | Status: DC | PRN

## 2017-12-22 MED ORDER — SIMETHICONE 80 MG PO CHEW: 80.0000 mg | CHEWABLE_TABLET | ORAL | Status: DC | PRN

## 2017-12-22 MED ORDER — DIPHENHYDRAMINE HCL 25 MG PO CAPS: 25.0000 mg | ORAL_CAPSULE | Freq: Four times a day (QID) | ORAL | Status: DC | PRN

## 2017-12-22 MED ORDER — TETANUS-DIPHTH-ACELL PERTUSSIS 5-2.5-18.5 LF-MCG/0.5 IM SUSP: 0.5000 mL | Freq: Once | INTRAMUSCULAR | Status: DC

## 2017-12-22 MED ORDER — FENTANYL 2.5 MCG/ML BUPIVACAINE 1/10 % EPIDURAL INFUSION (WH - ANES)
14.0000 mL/h | INTRAMUSCULAR | Status: DC | PRN
  Administered 2017-12-22: 14 mL/h via EPIDURAL

## 2017-12-22 MED ORDER — DIBUCAINE 1 % RE OINT: 1.0000 "application " | TOPICAL_OINTMENT | RECTAL | Status: DC | PRN

## 2017-12-22 MED ORDER — FENTANYL 2.5 MCG/ML BUPIVACAINE 1/10 % EPIDURAL INFUSION (WH - ANES)
INTRAMUSCULAR | Status: DC
Start: 2017-12-22 — End: 2017-12-22
  Filled 2017-12-22: qty 100

## 2017-12-22 MED ORDER — ONDANSETRON HCL 4 MG/2ML IJ SOLN: 4.0000 mg | INTRAMUSCULAR | Status: DC | PRN

## 2017-12-22 MED ORDER — METHYLERGONOVINE MALEATE 0.2 MG/ML IJ SOLN: 0.2000 mg | INTRAMUSCULAR | Status: DC | PRN

## 2017-12-22 MED ORDER — ONDANSETRON HCL 4 MG PO TABS
4.0000 mg | ORAL_TABLET | ORAL | Status: DC | PRN
Start: 2017-12-22 — End: 2017-12-23
  Administered 2017-12-22 (×4): 4 mg via ORAL
  Filled 2017-12-22 (×4): qty 1

## 2017-12-22 NOTE — Anesthesia Postprocedure Evaluation (Signed)
Anesthesia Post Note  Patient: [name]  Procedure(s) Performed: AN AD HOC LABOR EPIDURAL     Patient location during evaluation: Women's Unit Anesthesia Type: Epidural Level of consciousness: awake, awake and alert and oriented Pain management: pain level controlled Vital Signs Assessment: post-procedure vital signs reviewed and stable Respiratory status: spontaneous breathing, nonlabored ventilation and respiratory function stable Cardiovascular status: stable Postop Assessment: no headache, no backache, patient able to bend at knees, no apparent nausea or vomiting, adequate PO intake and able to ambulate Anesthetic complications: no    Last Vitals:  Vitals:   12/22/17 0600 12/22/17 0834  BP: 122/84 125/76  Pulse: (!) 130 (!) 113  Resp: 16 18  Temp: 36.9 C 36.6 C  SpO2: 99% 99%    Last Pain:  Vitals:   12/22/17 0900  TempSrc:   PainSc: 5    Pain Goal: Patients Stated Pain Goal: 3 (12/22/17 0900)               Rayn Enderson

## 2017-12-23 ENCOUNTER — Telehealth (HOSPITAL_COMMUNITY): Payer: Self-pay | Admitting: Lactation Services

## 2017-12-23 MED ORDER — OXYCODONE-ACETAMINOPHEN 5-325 MG PO TABS: 1.0000 | ORAL_TABLET | ORAL | 0 refills | Status: DC | PRN

## 2017-12-23 MED ORDER — ALPRAZOLAM 0.5 MG PO TABS: 0.5000 mg | ORAL_TABLET | Freq: Every evening | ORAL | 0 refills | Status: DC | PRN

## 2017-12-23 MED ORDER — PROMETHAZINE HCL 25 MG PO TABS: 25.0000 mg | ORAL_TABLET | Freq: Four times a day (QID) | ORAL | 0 refills | Status: DC | PRN

## 2017-12-23 NOTE — Progress Notes (Signed)
I offered support to Linda Becker as well as her SO over several visits during the past 2 days.  She is focusing her energy right now on the arrangements for Linda Becker as she feels that this is the one thing that she can do for him.  They have a family friend at [employer] [facility] and will have Linda Becker buried at [employer].  I offered logistical help with making arrangements as well as grief support.  She is considering pumping and donating her milk and her milk is coming in with a large volume.  I put her in touch with lactation.  And will call to check in on her on Friday.  [phone].    [place], Bcc PAger, [phone] 4:44 PM   12/23/17 1600  Clinical Encounter Type  Visited With Patient and family together  Visit Type Spiritual support  Referral From Chaplain;Nurse  Spiritual Encounters  Spiritual Needs Emotional;Grief support

## 2017-12-23 NOTE — Discharge Summary (Signed)
Obstetric Discharge Summary Reason for Admission: fetal demise @33  weeks Prenatal Procedures: NST and ultrasound Intrapartum Procedures: spontaneous vaginal delivery Postpartum Procedures: none Complications-Operative and Postpartum: none Hemoglobin  Date Value Ref Range Status  12/22/2017 11.0 (L) 12.0 - 15.0 g/dL Final  12/31/2016 14.3 11.1 - 15.9 g/dL Final   HCT  Date Value Ref Range Status  12/22/2017 33.3 (L) 36.0 - 46.0 % Final   Hematocrit  Date Value Ref Range Status  12/31/2016 45.0 34.0 - 46.6 % Final    Physical Exam:  General: alert and cooperative Lochia: appropriate Uterine Fundus: firm DVT Evaluation: No evidence of DVT seen on physical exam.  Discharge Diagnoses:  NSVD of 33 week fetal demise Date: 12/23/2017 Activity: pelvic rest Diet: routine Medications: PNV, Ibuprofen and Percocet Condition: stable Instructions: refer to practice specific booklet Discharge to: home Follow-up Information    Vanessa Kick, MD Follow up in 4 week(s).   Specialty:  Obstetrics and Gynecology Contact information: [address] [place] [address] [phone]           Newborn Data: Live born female  Birth Weight: 3 lb 10 oz (1644 g) APGAR: 0, 0  Newborn Delivery   Birth date/time:  12/22/2017 01:42:00 Delivery type:  Vaginal, Spontaneous    Fetal demise to autopsy per patient  Allyn Kenner 12/23/2017, 11:29 AM

## 2017-12-23 NOTE — Telephone Encounter (Signed)
Mother discharged today after fetal demise and is hand expressing approx 4 oz and has manual pump.  She is [employer] and would like employee pump and asked for information about donating milk.  Left Employee pump in downstairs office and milk bank brochure for family to pick up tomorrow.  Encouraged to call milk bank in morning and pump q 2.-5-3 hours.  Discussed engorgement care.  Provided lactation phone number for further questions and support.

## 2017-12-25 MED FILL — hydrOXYzine HCL 25 MG TABS: 25 | 10 days supply | Qty: 30 | Fill #0

## 2017-12-28 ENCOUNTER — Other Ambulatory Visit: Payer: Self-pay | Admitting: *Deleted

## 2017-12-28 NOTE — Patient Outreach (Signed)
[facility]) Care Management  12/28/2017  [name] 08/12/1990 [id number]   Linda Becker is in the [place] [facility] Gestational Diabetes Program. She was admitted to [facility] on 2018-01-11 for intrauterine fetal death at approximately 110  weeks gestation. Labor was induced and a baby boy was born on 12/22/17. Linda Becker was discharged home on 7/10. Left message on Linda Becker's mobile number requesting return call in order to completed transition of care assessment and to offer grief counseling via [place] and to review with her the [facility] bereavement benefit.  Await return call.  Barrington Ellison RN,CCM,CDE [place] Management Coordinator Office Phone [phone] Office Fax [phone]

## 2017-12-29 ENCOUNTER — Ambulatory Visit (HOSPITAL_COMMUNITY): Payer: 59

## 2017-12-29 DIAGNOSIS — R21 Rash and other nonspecific skin eruption: Secondary | ICD-10-CM | POA: Diagnosis not present

## 2017-12-29 DIAGNOSIS — R102 Pelvic and perineal pain: Secondary | ICD-10-CM | POA: Diagnosis not present

## 2017-12-29 DIAGNOSIS — R300 Dysuria: Secondary | ICD-10-CM | POA: Diagnosis not present

## 2017-12-29 MED FILL — predniSONE 10 MG TABS: 10 | 6 days supply | Qty: 31 | Fill #0

## 2017-12-29 MED FILL — CLINDAMYCIN HCL 300 MG CAP: 300 | 7 days supply | Qty: 42 | Fill #0

## 2017-12-29 MED FILL — TRIAMCINOLONE 0.025% LOTION: 0.025 | 30 days supply | Qty: 120 | Fill #0

## 2017-12-30 ENCOUNTER — Other Ambulatory Visit: Payer: Self-pay | Admitting: Allergy and Immunology

## 2017-12-30 MED FILL — MONTELUKAST SOD 10 MG TAB: 10 | 90 days supply | Qty: 90 | Fill #0

## 2017-12-30 MED FILL — QNASL 80 MCG/ACT AERS: 80 | 30 days supply | Qty: 11 | Fill #3

## 2017-12-31 DIAGNOSIS — F314 Bipolar disorder, current episode depressed, severe, without psychotic features: Secondary | ICD-10-CM | POA: Diagnosis not present

## 2017-12-31 DIAGNOSIS — F332 Major depressive disorder, recurrent severe without psychotic features: Secondary | ICD-10-CM | POA: Diagnosis not present

## 2018-01-01 ENCOUNTER — Other Ambulatory Visit: Payer: Self-pay | Admitting: *Deleted

## 2018-01-01 DIAGNOSIS — K7689 Other specified diseases of liver: Secondary | ICD-10-CM | POA: Diagnosis not present

## 2018-01-01 DIAGNOSIS — O24410 Gestational diabetes mellitus in pregnancy, diet controlled: Secondary | ICD-10-CM

## 2018-01-01 NOTE — Patient Outreach (Signed)
[facility]) Care Management  01/01/2018  Linda Becker 1991-02-16 [id number]   Spoke with Linda Becker via cell phone and transition of care assessment completed. See transition of care template for details.  Linda Becker was  in the [place] [facility] Gestational Diabetes Program. She was admitted to [facility] on 2018-01-19 for intrauterine fetal death at approximately [redacted]  weeks gestation. Labor was induced and a baby boy was born on 12/22/17. Linda Becker was discharged home on 7/10. Linda Becker says she is doing as well as can be expected. She says she has wonderful support from her family and friends. Sympathy on the death of her baby boy and emotional support provided.  She says she had a severe allergic reaction to an antibiotic she was given for infection and her liver enzymes are elevated. She says she is seeing her provider today and if her liver enzymes are still elevated, they will refer her to a specialist. Discussed benefits of the [facility] including bereavement  benefits and grief counseling via the [place]. Will  close case to [place] Management services as no further case management needs identified at this time.  Barrington Ellison RN,CCM,CDE [place] Management Coordinator Office Phone [phone] Office Fax [phone]

## 2018-01-04 DIAGNOSIS — F408 Other phobic anxiety disorders: Secondary | ICD-10-CM | POA: Diagnosis not present

## 2018-01-04 DIAGNOSIS — F3181 Bipolar II disorder: Secondary | ICD-10-CM | POA: Diagnosis not present

## 2018-01-05 ENCOUNTER — Ambulatory Visit (HOSPITAL_COMMUNITY): Payer: 59

## 2018-01-05 ENCOUNTER — Encounter (HOSPITAL_COMMUNITY): Payer: Self-pay

## 2018-01-05 DIAGNOSIS — F332 Major depressive disorder, recurrent severe without psychotic features: Secondary | ICD-10-CM | POA: Diagnosis not present

## 2018-01-05 DIAGNOSIS — F314 Bipolar disorder, current episode depressed, severe, without psychotic features: Secondary | ICD-10-CM | POA: Diagnosis not present

## 2018-01-07 MED FILL — lamoTRIgine 25 MG TABS: 25 | 30 days supply | Qty: 54 | Fill #0

## 2018-01-07 MED FILL — LITHIUM CARBONATE ER 300 MG: 300 | 30 days supply | Qty: 90 | Fill #0

## 2018-01-07 MED FILL — ONDANSETRON HCL 4 MG TABLET: 4 | 10 days supply | Qty: 30 | Fill #0

## 2018-01-13 ENCOUNTER — Telehealth: Payer: Self-pay

## 2018-01-13 DIAGNOSIS — K7689 Other specified diseases of liver: Secondary | ICD-10-CM | POA: Diagnosis not present

## 2018-01-14 ENCOUNTER — Encounter: Payer: Self-pay | Admitting: Physician Assistant

## 2018-01-14 DIAGNOSIS — F314 Bipolar disorder, current episode depressed, severe, without psychotic features: Secondary | ICD-10-CM | POA: Diagnosis not present

## 2018-01-14 DIAGNOSIS — F332 Major depressive disorder, recurrent severe without psychotic features: Secondary | ICD-10-CM | POA: Diagnosis not present

## 2018-01-20 ENCOUNTER — Telehealth: Payer: Self-pay | Admitting: Emergency Medicine

## 2018-01-20 MED FILL — LEVOTHYROXINE 88 MCG TABLET: 88 | 30 days supply | Qty: 30 | Fill #1

## 2018-01-20 NOTE — Telephone Encounter (Signed)
Pt called to reschedule her appt. Pt also asked if the no show fee can be removed for appt DOS 12/22/17. She was admitted to the hospital for delivery of her baby and didn't think about calling and cancelling it. Thanks.

## 2018-01-21 DIAGNOSIS — F314 Bipolar disorder, current episode depressed, severe, without psychotic features: Secondary | ICD-10-CM | POA: Diagnosis not present

## 2018-01-21 DIAGNOSIS — R87612 Low grade squamous intraepithelial lesion on cytologic smear of cervix (LGSIL): Secondary | ICD-10-CM | POA: Diagnosis not present

## 2018-01-21 DIAGNOSIS — Z124 Encounter for screening for malignant neoplasm of cervix: Secondary | ICD-10-CM | POA: Diagnosis not present

## 2018-01-21 DIAGNOSIS — F332 Major depressive disorder, recurrent severe without psychotic features: Secondary | ICD-10-CM | POA: Diagnosis not present

## 2018-01-22 MED FILL — ESOMEPRAZOLE MAG DR 40 MG C: 40 | 30 days supply | Qty: 60 | Fill #2

## 2018-01-26 ENCOUNTER — Encounter: Payer: Self-pay | Admitting: *Deleted

## 2018-02-04 ENCOUNTER — Other Ambulatory Visit (INDEPENDENT_AMBULATORY_CARE_PROVIDER_SITE_OTHER): Payer: 59

## 2018-02-04 ENCOUNTER — Encounter: Payer: Self-pay | Admitting: Allergy and Immunology

## 2018-02-04 ENCOUNTER — Encounter: Payer: Self-pay | Admitting: Physician Assistant

## 2018-02-04 ENCOUNTER — Ambulatory Visit (INDEPENDENT_AMBULATORY_CARE_PROVIDER_SITE_OTHER): Payer: 59 | Admitting: Physician Assistant

## 2018-02-04 ENCOUNTER — Ambulatory Visit (INDEPENDENT_AMBULATORY_CARE_PROVIDER_SITE_OTHER): Payer: 59 | Admitting: Allergy and Immunology

## 2018-02-04 VITALS — BP 126/90 | HR 84 | Resp 20

## 2018-02-04 VITALS — BP 118/80 | HR 110 | Ht 66.0 in | Wt 326.0 lb

## 2018-02-04 DIAGNOSIS — K219 Gastro-esophageal reflux disease without esophagitis: Secondary | ICD-10-CM

## 2018-02-04 DIAGNOSIS — R7989 Other specified abnormal findings of blood chemistry: Secondary | ICD-10-CM

## 2018-02-04 DIAGNOSIS — R112 Nausea with vomiting, unspecified: Secondary | ICD-10-CM

## 2018-02-04 DIAGNOSIS — L308 Other specified dermatitis: Secondary | ICD-10-CM | POA: Diagnosis not present

## 2018-02-04 DIAGNOSIS — J3089 Other allergic rhinitis: Secondary | ICD-10-CM

## 2018-02-04 DIAGNOSIS — F332 Major depressive disorder, recurrent severe without psychotic features: Secondary | ICD-10-CM | POA: Diagnosis not present

## 2018-02-04 DIAGNOSIS — J4550 Severe persistent asthma, uncomplicated: Secondary | ICD-10-CM | POA: Diagnosis not present

## 2018-02-04 DIAGNOSIS — R1013 Epigastric pain: Secondary | ICD-10-CM

## 2018-02-04 DIAGNOSIS — R945 Abnormal results of liver function studies: Secondary | ICD-10-CM

## 2018-02-04 DIAGNOSIS — L989 Disorder of the skin and subcutaneous tissue, unspecified: Secondary | ICD-10-CM

## 2018-02-04 DIAGNOSIS — F314 Bipolar disorder, current episode depressed, severe, without psychotic features: Secondary | ICD-10-CM | POA: Diagnosis not present

## 2018-02-04 LAB — COMPREHENSIVE METABOLIC PANEL
ALK PHOS: 139 U/L — AB (ref 39–117)
ALT: 156 U/L — ABNORMAL HIGH (ref 0–35)
AST: 90 U/L — ABNORMAL HIGH (ref 0–37)
Albumin: 4 g/dL (ref 3.5–5.2)
BUN: 16 mg/dL (ref 6–23)
CO2: 24 mEq/L (ref 19–32)
Calcium: 9.4 mg/dL (ref 8.4–10.5)
Chloride: 108 mEq/L (ref 96–112)
Creatinine, Ser: 0.94 mg/dL (ref 0.40–1.20)
GFR: 76.97 mL/min (ref >60.00)
GLUCOSE: 119 mg/dL — AB (ref 70–99)
POTASSIUM: 4 meq/L (ref 3.5–5.1)
Sodium: 139 mEq/L (ref 135–145)
Total Bilirubin: 0.4 mg/dL (ref 0.2–1.2)
Total Protein: 7 g/dL (ref 6.0–8.3)

## 2018-02-04 LAB — HIGH SENSITIVITY CRP: CRP, High Sensitivity: 23.16 mg/L — ABNORMAL HIGH (ref 0.000–5.000)

## 2018-02-04 LAB — CBC WITH DIFFERENTIAL/PLATELET
Basophils Absolute: 0.1 10*3/uL (ref 0.0–0.1)
Basophils Relative: 0.7 % (ref 0.0–3.0)
EOS PCT: 3.4 % (ref 0.0–5.0)
Eosinophils Absolute: 0.3 10*3/uL (ref 0.0–0.7)
HCT: 38.4 % (ref 36.0–46.0)
Hemoglobin: 12.8 g/dL (ref 12.0–15.0)
LYMPHS ABS: 1.8 10*3/uL (ref 0.7–4.0)
Lymphocytes Relative: 23.3 % (ref 12.0–46.0)
MCHC: 33.4 g/dL (ref 30.0–36.0)
MCV: 92.5 fl (ref 78.0–100.0)
MONOS PCT: 5.3 % (ref 3.0–12.0)
Monocytes Absolute: 0.4 10*3/uL (ref 0.1–1.0)
NEUTROS ABS: 5.1 10*3/uL (ref 1.4–7.7)
NEUTROS PCT: 67.3 % (ref 43.0–77.0)
Platelets: 184 10*3/uL (ref 150.0–400.0)
RBC: 4.15 Mil/uL (ref 3.87–5.11)
RDW: 14 % (ref 11.5–15.5)
WBC: 7.6 10*3/uL (ref 4.0–10.5)

## 2018-02-04 LAB — SEDIMENTATION RATE: SED RATE: 68 mm/h — AB (ref 0–20)

## 2018-02-04 LAB — PROTIME-INR
INR: 1.1 ratio — AB (ref 0.8–1.0)
PROTHROMBIN TIME: 12.3 s (ref 9.6–13.1)

## 2018-02-04 MED ORDER — ONDANSETRON 4 MG PO TBDP: ORAL_TABLET | ORAL | 1 refills | Status: DC

## 2018-02-04 MED ORDER — TRAMADOL HCL 50 MG PO TABS: ORAL_TABLET | ORAL | 0 refills | Status: DC

## 2018-02-04 MED ORDER — ESOMEPRAZOLE MAGNESIUM 40 MG PO CPDR: 40.0000 mg | DELAYED_RELEASE_CAPSULE | Freq: Two times a day (BID) | ORAL | 6 refills | Status: DC

## 2018-02-04 MED ORDER — OLOPATADINE HCL 0.7 % OP SOLN: 1.0000 [drp] | Freq: Every day | OPHTHALMIC | 5 refills | Status: DC | PRN

## 2018-02-04 MED ORDER — MOMETASONE FUROATE 0.1 % EX OINT: TOPICAL_OINTMENT | CUTANEOUS | 5 refills | Status: DC

## 2018-02-04 MED ORDER — TIOTROPIUM BROMIDE MONOHYDRATE 1.25 MCG/ACT IN AERS: 2.0000 | INHALATION_SPRAY | Freq: Every day | RESPIRATORY_TRACT | 0 refills | Status: DC

## 2018-02-04 MED FILL — traMADol HCL 50 MG TABS: 50 | 7 days supply | Qty: 30 | Fill #0

## 2018-02-04 MED FILL — MOMETASONE FUROATE 0.1% OIN: 0.1 | 30 days supply | Qty: 90 | Fill #0

## 2018-02-04 MED FILL — PAZEO 0.7% EYE DROPS: 0.7 | 30 days supply | Qty: 3 | Fill #0

## 2018-02-04 MED FILL — ONDANSETRON ODT 4 MG TABLET: 4 | 5 days supply | Qty: 20 | Fill #0

## 2018-02-04 NOTE — Progress Notes (Signed)
Follow-up Note  Referring Provider: Burnis Medin, MD Primary Provider: Burnis Medin, MD Date of Office Visit: 02/04/2018  Subjective:   Linda Becker (DOB: 01-20-1991) is a 27 y.o. female who returns to the [facility] on 02/04/2018 in re-evaluation of the following:  HPI: Linda Becker presents to this clinic in evaluation of asthma and allergic rhinoconjunctivitis and reflux.  I last saw her in this clinic on 17 November 2017 at which point in time she was doing relatively well although she did have some increased activity of her asthma for which we had her remain on a combination of anti-inflammatory agents for her airway at the same time that she addressed issues of reflux induced respiratory disease.  In July she apparently had a uterine infection and delivered stillborn at [redacted] weeks gestation and 1 week after that event developed a dermatitis that she describes as red and raised and itchy that peaked at about 2 weeks or so and required the administration of high-dose systemic steroids and antibiotics for her infection.  At one point there was weeping from her dermatitis.  Although her dermatitis has resolved she is still very itchy and her skin has changed and it is now dry and scaly.  Apparently her fever was 103.5 associated with that infection.  In addition, she had an elevation in liver function tests at around the same point in time.  Besides for having itchy skin she is now also left with very irritated itchy gritty eyes.  She did have a fair amount of coughing and mouth dryness during that timeframe but for the most part that has resolved as well.  She has been having some persistent nausea since that event.  She is scheduled to see GI later today.  Allergies as of 02/04/2018      Reactions   Progesterone Rash   Was in a form of birth control.      Medication List      cetirizine 10 MG tablet Commonly known as:  ZYRTEC Take 10 mg by mouth daily.     esomeprazole 40 MG capsule Commonly known as:  NEXIUM Take 1 capsule (40 mg total) by mouth 2 (two) times daily before a meal.   lamoTRIgine 25 MG tablet Commonly known as:  LAMICTAL TAKE 1 TABLET BY MOUTH DAILY FOR 14 DAYS, THEN 2 TABS DAILY FOR 14 DAYS, THEN 4 TABS DAILY (CALL MD IF RASH AND IF OUT MORE THAN 1 WK DO NOT   levothyroxine 88 MCG tablet Commonly known as:  SYNTHROID, LEVOTHROID Take 88 mcg by mouth daily before breakfast.   lithium carbonate 300 MG CR tablet Commonly known as:  LITHOBID TAKE 1 TABLET BY MOUTH DAILY AT BEDTTIME FOR 4 DAYS, THEN 2 TABS AT BEDTIME FOR 4 DAYS, THEN 3 TABS AT BEDTIME FOR 4 DAYS   mometasone 0.1 % ointment Commonly known as:  ELOCON Can use on affected skin once daily as directed after bathing.   montelukast 10 MG tablet Commonly known as:  SINGULAIR TAKE 1 TABLET BY MOUTH DAILY.   Olopatadine HCl 0.7 % Soln Place 1 drop into both eyes daily as needed (for red, watery, itchy eyes).   ondansetron 4 MG disintegrating tablet Commonly known as:  ZOFRAN-ODT Take 4 mg by mouth.   PRE-NATAL PO Take by mouth daily.   PROAIR HFA 108 (90 Base) MCG/ACT inhaler Generic drug:  albuterol Inhale 2 puffs into the lungs every 6 (six) hours as needed for  wheezing or shortness of breath.   QNASL 80 MCG/ACT Aers Generic drug:  Beclomethasone Dipropionate Place into the nose. Use 1-2 sprays in each nostril once daily as directed.   SYMBICORT 160-4.5 MCG/ACT inhaler Generic drug:  budesonide-formoterol Inhale 2 puffs into the lungs 2 (two) times daily.   Tiotropium Bromide Monohydrate 1.25 MCG/ACT Aers Inhale 2 Doses into the lungs daily.   traMADol 50 MG tablet Commonly known as:  ULTRAM Take 1 tab by mouth every 6 hours as needed for pain.   Triamcinolone Acetonide 0.025 % Lotn Apply topically. Apply a thin layer to the affected area (s) of skin 2 times per day.   Vitamin D (Ergocalciferol) 50000 units Caps capsule Commonly known as:   DRISDOL Take 50,000 Units by mouth every 7 (seven) days.       Past Medical History:  Diagnosis Date  . Allergic rhinitis    remote hx of allergy shots  . Asthma    since childhood  on controller meds extrinsic dr Carmelina Peal  . GERD (gastroesophageal reflux disease)    on nexium  for  long term sx since childhood  . H/O miscarriage, not currently pregnant    [redacted] weeks  march 2016  . Hypothyroidism   . Migraine   . Murmur    pt reports MVP  . Syncope    under eval ? cause  . Tachycardia    episodes with near syncope eval dr Nadyne Coombes  on no meds dced LABA    Past Surgical History:  Procedure Laterality Date  . broken right femur  2008   rod placed  . DILATION AND CURETTAGE OF UTERUS    . OB ultrasound N/A 12/01/2017   see report  . TONSILLECTOMY  2006  . WISDOM TOOTH EXTRACTION      Review of systems negative except as noted in HPI / PMHx or noted below:  Review of Systems  Constitutional: Negative.   HENT: Negative.   Eyes: Negative.   Respiratory: Negative.   Cardiovascular: Negative.   Gastrointestinal: Negative.   Genitourinary: Negative.   Musculoskeletal: Negative.   Skin: Negative.   Neurological: Negative.   Endo/Heme/Allergies: Negative.   Psychiatric/Behavioral: Negative.      Objective:   Vitals:   02/04/18 1102 02/04/18 1210  BP: (!) 128/92 126/90  Pulse: 84   Resp: 20           Physical Exam  HENT:  Head: Normocephalic.  Right Ear: Tympanic membrane, external ear and ear canal normal.  Left Ear: Tympanic membrane, external ear and ear canal normal.  Nose: Nose normal. No mucosal edema or rhinorrhea.  Mouth/Throat: Uvula is midline and oropharynx is clear and moist. No oropharyngeal exudate.  Geographic tongue  Eyes: Conjunctivae are normal.  Neck: Trachea normal. No tracheal tenderness present. No tracheal deviation present. No thyromegaly present.  Cardiovascular: Normal rate, regular rhythm, S1 normal, S2 normal and normal heart sounds.    No murmur heard. Pulmonary/Chest: Breath sounds normal. No stridor. No respiratory distress. She has no wheezes. She has no rales.  Musculoskeletal: She exhibits no edema.  Lymphadenopathy:       Head (right side): No tonsillar adenopathy present.       Head (left side): No tonsillar adenopathy present.    She has no cervical adenopathy.  Neurological: She is alert.  Skin: Rash (Slightly scaly globally, livedo reticularis lower extremities) noted. She is not diaphoretic. No erythema. Nails show no clubbing.    Diagnostics:    Spirometry  was performed and demonstrated an FEV1 of 3.52 at 3.47 % of predicted.  The patient had an Asthma Control Test with the following results: ACT Total Score: 9.    Results of blood tests obtained 04 February 2018 identified AST 90 U/L, ALT 150 6U/L, WBC 7.6, absolute eosinophil 300, absolute lymphocyte 1800, hemoglobin 12.8, platelet 184.  Assessment and Plan:   1. Not well controlled severe persistent asthma   2. Perennial allergic rhinitis   3. Gastroesophageal reflux disease, esophagitis presence not specified   4. Inflammatory dermatosis     1. Every day use the following:   A. Symbicort 160 two inhalation two times per day with spacer  B. Spiriva 1.25 Respimat - 2 inhalations one time per day  C. montelukast 57m one tablet one time per day  D. Nexium 40 mg one tablet twice a day  E. Ranitidine 300 mg in evening.   D. Cetirizine 155mone tablet two times per day  E. Shower followed by mometasone 0.1% ointment daily  F. Pataday /PLillie Fragminne drop each eye one time per day  G. Qnasal - one puff each nostril one time per day    2. If needed:   A. ProAir HFA 2 inhalations or albuterol neb every 4-6 hours  B. OTC Systane multiple times per day  3. Return in 2 weeks or earlier if problem  4. Dupilumab?  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  Fortunately, this entire event appears to be resolving.  The etiology of this event was probably a combination of pregnancy and uterine infection both of which now appear to be resolved issues.  I would like for her to utilize the very large collection of medical therapy noted above to address her respiratory and conjunctival and skin inflammation and I will see her back in this clinic in 2 weeks.  Our long-term plan was to start her on dupilumab but I would like for her to just be a little bit more baseline with resolution of this episode of immunological hyperreactivity she just went through prior to starting that agent.  ErAllena KatzMD Allergy / Immunology [place]

## 2018-02-04 NOTE — Progress Notes (Addendum)
Subjective:    Patient ID: Linda Becker, female    DOB: 17-May-1991, 27 y.o.   MRN: [id number]  HPI Linda Becker is a pleasant 27 year old white female, new to GI today referred by Dr. Lamont Dowdy for evaluation of persistently elevated LFTs.  ] Patient has history of GERD, and states she had a remote EGD with Dr. Watt Climes.  She has  been maintained on Nexium 40 mg p.o. twice daily for several years. Also with history of migraine headaches, morbid obesity and thyroid disorder. Patient unfortunately had fetal demise during recent pregnancy, and delivered a stillborn child at 39 weeks in July 2019. On 12/20/2017 she was noted to have alk phos of 150 AST of 62 ALT of 50 T bili of 0.3, hemoglobin of 11.1 hematocrit of 33.7 platelets 138.  She had not had any prior LFTs.  She has been unaware of any issues with her liver in the past. Now over the past month she has developed postprandial abdominal pain intermittent nausea without vomiting describes a crampy sensation in her upper abdomen.  She is also had some intermittent urgency and loose stools postprandially.  No fever or chills.  She has a prescription for Zofran which she is been taking as needed.  At this point she is having daily symptoms and is uncomfortable. She has been taking 800 mg ibuprofen 1 or 2 daily over the past couple of weeks. Most recent labs done on 01/11/2018 by GYN with hepatitis A, B, and C serology negative Cholesterol 277/triglycerides 276, CBC within normal limits, alk phos 157, ALT 20 and AST 75.  On further questioning she recalls that her mother has cirrhosis.  Patient was not clear of  the meaning of cirrhosis.    Review of Systems Pertinent positive and negative review of systems were noted in the above HPI section.  All other review of systems was otherwise negative.  Outpatient Encounter Medications as of 02/04/2018  Medication Sig  . albuterol (PROAIR HFA) 108 (90 Base) MCG/ACT inhaler Inhale 2 puffs into the lungs  every 6 (six) hours as needed for wheezing or shortness of breath.  . Beclomethasone Dipropionate (QNASL) 80 MCG/ACT AERS Place into the nose. Use 1-2 sprays in each nostril once daily as directed.  . budesonide-formoterol (SYMBICORT) 160-4.5 MCG/ACT inhaler Inhale 2 puffs into the lungs 2 (two) times daily.  . cetirizine (ZYRTEC) 10 MG tablet Take 10 mg by mouth daily.  Marland Kitchen esomeprazole (NEXIUM) 40 MG capsule Take 1 capsule (40 mg total) by mouth 2 (two) times daily before a meal.  . lamoTRIgine (LAMICTAL) 25 MG tablet TAKE 1 TABLET BY MOUTH DAILY FOR 14 DAYS, THEN 2 TABS DAILY FOR 14 DAYS, THEN 4 TABS DAILY (CALL MD IF RASH AND IF OUT MORE THAN 1 WK DO NOT  . levothyroxine (SYNTHROID, LEVOTHROID) 88 MCG tablet Take 88 mcg by mouth daily before breakfast.  . lithium carbonate (LITHOBID) 300 MG CR tablet TAKE 1 TABLET BY MOUTH DAILY AT BEDTTIME FOR 4 DAYS, THEN 2 TABS AT BEDTIME FOR 4 DAYS, THEN 3 TABS AT BEDTIME FOR 4 DAYS  . mometasone (ELOCON) 0.1 % ointment Can use on affected skin once daily as directed after bathing.  . montelukast (SINGULAIR) 10 MG tablet TAKE 1 TABLET BY MOUTH DAILY.  Marland Kitchen Olopatadine HCl (PAZEO) 0.7 % SOLN Place 1 drop into both eyes daily as needed (for red, watery, itchy eyes).  . ondansetron (ZOFRAN-ODT) 4 MG disintegrating tablet Take 4 mg by mouth.  . Prenatal Multivit-Min-Fe-FA (PRE-NATAL  PO) Take by mouth daily.  . Tiotropium Bromide Monohydrate (SPIRIVA RESPIMAT) 1.25 MCG/ACT AERS Inhale 2 Doses into the lungs daily.  . Triamcinolone Acetonide 0.025 % LOTN Apply topically. Apply a thin layer to the affected area (s) of skin 2 times per day.  . Vitamin D, Ergocalciferol, (DRISDOL) 50000 units CAPS capsule Take 50,000 Units by mouth every 7 (seven) days.  . [DISCONTINUED] esomeprazole (NEXIUM) 40 MG capsule Take 40 mg by mouth 2 (two) times daily before a meal.  . [DISCONTINUED] ondansetron (ZOFRAN-ODT) 4 MG disintegrating tablet Take 4 mg by mouth.  . traMADol (ULTRAM)  50 MG tablet Take 1 tab by mouth every 6 hours as needed for pain.  . [DISCONTINUED] ALPRAZolam (XANAX) 0.5 MG tablet Take 1 tablet (0.5 mg total) by mouth at bedtime as needed for anxiety.  . [DISCONTINUED] oxyCODONE-acetaminophen (PERCOCET/ROXICET) 5-325 MG tablet Take 1-2 tablets by mouth every 4 (four) hours as needed (pain scale 4-7).  . [DISCONTINUED] promethazine (PHENERGAN) 25 MG tablet Take 1 tablet (25 mg total) by mouth every 6 (six) hours as needed for nausea or vomiting.   No facility-administered encounter medications on file as of 02/04/2018.    Allergies  Allergen Reactions  . Progesterone Rash    Was in a form of birth control.   Patient Active Problem List   Diagnosis Date Noted  . Spontaneous vaginal delivery 12/22/2017  . IUFD at 9 weeks or more of gestation 12/20/2017  . Snoring 11/10/2017  . Gestational diabetes 10/12/2017  . Morbid obesity ([place]) 05/19/2017  . Adult hypothyroidism 05/19/2017  . BMI 40.0-44.9, adult ([place]) 11/09/2015  . GERD (gastroesophageal reflux disease)   . Tachycardia   . Syncope   . Allergic rhinitis   . Persistent asthma with undetermined severity   . Migraine with aura 09/26/2011   Social History   Socioeconomic History  . Marital status: Divorced    Spouse name: Not on file  . Number of children: Not on file  . Years of education: Not on file  . Highest education level: Not on file  Occupational History  . Not on file  Social Needs  . Financial resource strain: Not on file  . Food insecurity:    Worry: Not on file    Inability: Not on file  . Transportation needs:    Medical: Not on file    Non-medical: Not on file  Tobacco Use  . Smoking status: Never Smoker  . Smokeless tobacco: Never Used  Substance and Sexual Activity  . Alcohol use: No    Alcohol/week: 0.0 standard drinks    Frequency: Never  . Drug use: No  . Sexual activity: Yes    Birth control/protection: None  Lifestyle  . Physical activity:    Days per  week: Not on file    Minutes per session: Not on file  . Stress: Not on file  Relationships  . Social connections:    Talks on phone: Not on file    Gets together: Not on file    Attends religious service: Not on file    Active member of club or organization: Not on file    Attends meetings of clubs or organizations: Not on file    Relationship status: Not on file  . Intimate partner violence:    Fear of current or ex partner: Not on file    Emotionally abused: Not on file    Physically abused: Not on file    Forced sexual activity: Not on file  Other Topics Concern  . 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 Mudry's family history includes Arthritis in her father, maternal grandmother, and mother; Asthma in her father; Bleeding Disorder in her mother; Breast cancer in her maternal grandmother, paternal aunt, paternal aunt, and paternal grandmother; Colon cancer in her paternal grandfather; Colon polyps in her father and mother; Diabetes in her father, maternal grandfather, mother, paternal grandfather, and paternal grandmother; Esophageal cancer in her paternal aunt; Heart disease in her father and mother; Hyperlipidemia in her father and mother; Hypertension in her father and mother; Kidney disease in her mother; Liver cancer in her paternal grandfather; Liver disease in her mother; Pancreatic cancer in her maternal grandfather; Stomach cancer in her paternal grandfather; Thyroid disease in her maternal grandmother.      Objective:    Vitals:   02/04/18 1358  BP: 118/80  Pulse: (!) 110    Physical Exam; well-developed young white female in no acute distress, pleasant blood pressure 118/80 pulse 100, height 5 foot 6, weight 326, BMI 52.6.  HEENT; nontraumatic normocephalic  EOMI PERRLA sclera anicteric oral mucosa moist, Cardiovascular; regular rate and rhythm with S1-S2 no murmur rub or gallop, Pulmonary; clear bilaterally, Abdomen; obese, soft, she is tender in the epigastrium and right upper quadrant there is no guarding or rebound no palpable mass , liver edge is palpable 2 fingerbreadths below the right costal margin no palpable splenomegaly, bowel sounds present, Rectal; exam not done, Extremities; no clubbing cyanosis or edema skin warm and dry, cap Neuro psych; alert and oriented, grossly nonfocal mood and affect appropriate       Assessment & Plan:   #62 27 year old white female, morbidly obese with persistently elevated LFTs.  Patient was initially noted to have elevated LFTs, towards the end of her pregnancy at which time she had fetal demise and delivered a stillborn child at [redacted] weeks gestation in July 2019.  Etiology of persistently elevated LFTs is not clear currently.  If this was pregnancy related with cholestasis parameter should have improved. Rule out underlying fatty liver disease, NASH,, autoimmune liver disease or other inheritable form of liver disease  #2 several week history of epigastric and right upper quadrant abdominal pain intermittent nausea, both exacerbated by p.o. Intake Will need to rule out symptomatic gallbladder disease, gastropathy or peptic ulcer disease though less likely with chronic high-dose PPI use  #3 chronic GERD-on twice daily PPI for several years  Plan; Schedule for upper abdominal ultrasound, if ultrasound is negative consider CCK HIDA scan Patient is asked to completely discontinue NSAID use [place] she can take regular strength Tylenol if needed I gave her a limited supply of Ultram 50 mg every 6 hours as needed for pain Continue Nexium 40 mg p.o. twice daily B[place] low-fat diet Will check CMET, pro time, autoimmune liver markers and markers for inheritable forms of liver disease today. Patient will be established  with Dr. [name], will arrange office follow-up after above work-up. Linda Becker S Terree Gaultney PA-C 02/04/2018   Cc: Burnis Medin, MD   Addendum: Reviewed and agree with initial management. Pyrtle, [name], MD

## 2018-02-04 NOTE — Patient Instructions (Signed)
Your provider has requested that you go to the basement level for lab work before leaving today. Press "B" on the elevator. The lab is located at the first door on the left as you exit the elevator. We sent prescriptions to [facility]. 1. Ultram 2. Nexium 40 mg refills 3. zofran refill  Stop Ibuprofen, plain Tylenol OK,  Bland diet for now.  You have been scheduled for an abdominal ultrasound at [facility] (1st floor of hospital) on Tuesday 8-27 at 10:00 am. Please arrive at 9:45 am Bland Diet A bland diet consists of foods that do not have a lot of fat or fiber. Foods without fat or fiber are easier for the body to digest. They are also less likely to irritate your mouth, throat, stomach, and other parts of your gastrointestinal tract. A bland diet is sometimes called a BRAT diet. What is my plan? Your health care provider or dietitian may recommend specific changes to your diet to prevent and treat your symptoms, such as:  Eating small meals often.  Cooking food until it is soft enough to chew easily.  Chewing your food well.  Drinking fluids slowly.  Not eating foods that are very spicy, sour, or fatty.  Not eating citrus fruits, such as oranges and grapefruit.  What do I need to know about this diet?  Eat a variety of foods from the bland diet food list.  Do not follow a bland diet longer than you have to.  Ask your health care provider whether you should take vitamins. What foods can I eat? Grains  Hot cereals, such as cream of wheat. Bread, crackers, or tortillas made from refined white flour. Rice. Vegetables Canned or cooked vegetables. Mashed or boiled potatoes. Fruits Bananas. Applesauce. Other types of cooked or canned fruit with the skin and seeds removed, such as canned peaches or pears. Meats and Other Protein Sources Scrambled eggs. Creamy peanut butter or other nut butters. Lean, well-cooked meats, such as chicken or fish.  Tofu. Soups or broths. Dairy Low-fat dairy products, such as milk, cottage cheese, or yogurt. Beverages Water. Herbal tea. Apple juice. Sweets and Desserts Pudding. Custard. Fruit gelatin. Ice cream. Fats and Oils Mild salad dressings. Canola or olive oil. The items listed above may not be a complete list of allowed foods or beverages. Contact your dietitian for more options. What foods are not recommended? Foods and ingredients that are often not recommended include:  Spicy foods, such as hot sauce or salsa.  Fried foods.  Sour foods, such as pickled or fermented foods.  Raw vegetables or fruits, especially citrus or berries.  Caffeinated drinks.  Alcohol.  Strongly flavored seasonings or condiments.  The items listed above may not be a complete list of foods and beverages that are not allowed. Contact your dietitian for more information. This information is not intended to replace advice given to you by your health care provider. Make sure you discuss any questions you have with your health care provider. Document Released: 09/24/2015 Document Revised: 11/08/2015 Document Reviewed: 06/14/2014 Elsevier Interactive Patient Education  2018 [employer].  to your appointment for registration. Make certain not to have anything to eat or drink 6 hours prior to your appointment. Should you need to reschedule your appointment, please contact radiology at [phone]. This test typically takes about 30 minutes to perform.

## 2018-02-04 NOTE — Patient Instructions (Addendum)
  1. Every day use the following:   A. Symbicort 160 two inhalation two times per day with spacer  B. Spiriva 1.25 Respimat - 2 inhalations one time per day  C. montelukast 52m one tablet one time per day  D. Nexium 40 mg one tablet twice a day  E. Ranitidine 300 mg in evening.   D. Cetirizine 125mone tablet two times per day  E. Shower followed by mometasone 0.1% ointment daily  F. Pataday /PLillie Fragminne drop each eye one time per day  G. Qnasal - one puff each nostril one time per day    2. If needed:   A. ProAir HFA 2 inhalations or albuterol neb every 4-6 hours  B. OTC Systane multiple times per day  3. Return in 2 weeks or earlier if problem  4. Dupilumab?

## 2018-02-08 ENCOUNTER — Encounter: Payer: Self-pay | Admitting: Allergy and Immunology

## 2018-02-09 ENCOUNTER — Ambulatory Visit (HOSPITAL_COMMUNITY)
Admission: RE | Admit: 2018-02-09 | Discharge: 2018-02-09 | Disposition: A | Discharge status: Home / Self Care | Payer: 59 | Source: Ambulatory Visit | Attending: Physician Assistant | Admitting: Physician Assistant

## 2018-02-09 DIAGNOSIS — R112 Nausea with vomiting, unspecified: Secondary | ICD-10-CM | POA: Insufficient documentation

## 2018-02-09 DIAGNOSIS — K760 Fatty (change of) liver, not elsewhere classified: Secondary | ICD-10-CM | POA: Insufficient documentation

## 2018-02-09 DIAGNOSIS — R1013 Epigastric pain: Secondary | ICD-10-CM | POA: Diagnosis not present

## 2018-02-09 DIAGNOSIS — K828 Other specified diseases of gallbladder: Secondary | ICD-10-CM | POA: Insufficient documentation

## 2018-02-09 DIAGNOSIS — R7989 Other specified abnormal findings of blood chemistry: Secondary | ICD-10-CM | POA: Insufficient documentation

## 2018-02-09 DIAGNOSIS — R945 Abnormal results of liver function studies: Secondary | ICD-10-CM

## 2018-02-09 DIAGNOSIS — K839 Disease of biliary tract, unspecified: Secondary | ICD-10-CM | POA: Diagnosis not present

## 2018-02-10 ENCOUNTER — Encounter: Payer: Self-pay | Admitting: Endocrinology

## 2018-02-10 ENCOUNTER — Ambulatory Visit (INDEPENDENT_AMBULATORY_CARE_PROVIDER_SITE_OTHER): Payer: 59 | Admitting: Endocrinology

## 2018-02-10 VITALS — BP 118/64 | HR 94 | Ht 66.0 in | Wt 325.0 lb

## 2018-02-10 DIAGNOSIS — E039 Hypothyroidism, unspecified: Secondary | ICD-10-CM | POA: Diagnosis not present

## 2018-02-10 LAB — T4, FREE: FREE T4: 0.95 ng/dL (ref 0.60–1.60)

## 2018-02-10 LAB — MITOCHONDRIAL ANTIBODIES

## 2018-02-10 LAB — ANA: Anti Nuclear Antibody(ANA): NEGATIVE

## 2018-02-10 LAB — ALPHA-1-ANTITRYPSIN: A-1 Antitrypsin, Ser: 185 mg/dL (ref 83–199)

## 2018-02-10 LAB — TSH: TSH: 4.13 u[IU]/mL (ref 0.35–4.50)

## 2018-02-10 LAB — ANTI-SMOOTH MUSCLE ANTIBODY, IGG: Actin (Smooth Muscle) Antibody (IGG): <20 U (ref <20)

## 2018-02-10 LAB — CERULOPLASMIN: CERULOPLASMIN: 41 mg/dL (ref 18–53)

## 2018-02-10 MED FILL — SYMBICORT 160-4.5 MCG INH: 160-4.5 | 30 days supply | Qty: 10 | Fill #3

## 2018-02-10 MED FILL — PROAIR HFA 90 MCG INHALER: 108 (90 BAS | 25 days supply | Qty: 9 | Fill #1

## 2018-02-10 NOTE — Progress Notes (Signed)
Patient ID: Linda Becker, female   DOB: 1990/12/30, 27 y.o.   MRN: [id number]             Reason for Appointment:  Hypothyroidism, follow-up visit    History of Present Illness:    Hypothyroidism was first diagnosed in 10/2016  At the time of diagnosis patient was having symptoms of  fatigue, cold sensitivity, weight gain, brittle nails and hair loss .          However her symptoms had been present for several years and not any worse She was tested with her TSH because of needing to start lithium therapy Baseline TSH was 6.7, no baseline free T4 or free T3 available  Prior to consultation she had been treated with  liothyronine, initially 25 g daily and then 50 g by her psychiatrist for treatment of her high TSH The patient did not subjectively feeling any better with taking the liothyronine; with this she was having palpitations, shortness of breath and feeling excessively warm and sweaty Because of her symptoms she was told to stop the liothyronine and have baseline labs done again  RECENT history:  She had been observed without any supplementation prior to her pregnancy and she was only mildly symptomatic Her highest TSH has been 5.2  She was started on levothyroxine supplementation for pregnancy and this has been continued She was seen during her pregnancy in 5/19 and her dose was increased up to 88 mcg  However since she has had her miscarriage her TSH level has been inconsistent, going up in early July and was 2.5 on 01/13/2018 done by outside lab However subjectively she is doing fairly well without any unusual fatigue She has lost most of the weight she had gained in late pregnancy  Currently taking the levothyroxine before breakfast but she takes her prenatal vitamin also at the same time         Patient's weight history is as follows:  Wt Readings from Last 3 Encounters:  02/10/18 (!) 325 lb (147.4 kg)  02/04/18 (!) 326 lb (147.9 kg)  12/20/17 (!) 351 lb  (159.2 kg)    TPO antibody was negative  Thyroid function results have been as follows:  Lab Results  Component Value Date   TSH 4.853 (H) 12/20/2017   TSH 3.70 12/18/2017   TSH 1.77 10/27/2017   TSH 3.40 08/04/2017   FREET4 0.72 (L) 12/20/2017   FREET4 0.61 12/18/2017   FREET4 0.65 10/27/2017   FREET4 0.91 06/29/2017   T3FREE 3.7 12/18/2017   T3FREE 3.5 06/29/2017    02/05/17: Free thyroxine index low at 1.0   Past Medical History:  Diagnosis Date  . Allergic rhinitis    remote hx of allergy shots  . Asthma    since childhood  on controller meds extrinsic dr Carmelina Peal  . GERD (gastroesophageal reflux disease)    on nexium  for  long term sx since childhood  . H/O miscarriage, not currently pregnant    [redacted] weeks  march 2016  . Hypothyroidism   . Migraine   . Murmur    pt reports MVP  . Syncope    under eval ? cause  . Tachycardia    episodes with near syncope eval dr Nadyne Coombes  on no meds dced LABA    Past Surgical History:  Procedure Laterality Date  . broken right femur  2008   rod placed  . DILATION AND CURETTAGE OF UTERUS    . OB ultrasound N/A 12/01/2017  see report  . TONSILLECTOMY  2006  . WISDOM TOOTH EXTRACTION      Family History  Problem Relation Age of Onset  . Asthma Father   . Arthritis Father   . Hyperlipidemia Father   . Heart disease Father   . Hypertension Father   . Diabetes Father   . Colon polyps Father   . Arthritis Mother   . Hyperlipidemia Mother   . Heart disease Mother   . Hypertension Mother   . Diabetes Mother   . Kidney disease Mother   . Liver disease Mother   . Bleeding Disorder Mother   . Colon polyps Mother   . Arthritis Maternal Grandmother   . Breast cancer Maternal Grandmother   . Thyroid disease Maternal Grandmother   . Colon cancer Paternal Grandfather   . Diabetes Paternal Grandfather   . Liver cancer Paternal Grandfather   . Stomach cancer Paternal Grandfather   . Breast cancer Paternal Grandmother   .  Diabetes Paternal Grandmother   . Breast cancer Paternal Aunt   . Esophageal cancer Paternal Aunt   . Breast cancer Paternal Aunt   . Diabetes Maternal Grandfather   . Pancreatic cancer Maternal Grandfather     Social History:  reports that she has never smoked. She has never used smokeless tobacco. She reports that she does not drink alcohol or use drugs.  Allergies:  Allergies  Allergen Reactions  . Progesterone Rash    Was in a form of birth control.  . Penicillins     Allergies as of 02/10/2018      Reactions   Progesterone Rash   Was in a form of birth control.   Penicillins       Medication List        Accurate as of 02/10/18  1:22 PM. Always use your most recent med list.          cetirizine 10 MG tablet Commonly known as:  ZYRTEC Take 10 mg by mouth daily.   esomeprazole 40 MG capsule Commonly known as:  NEXIUM Take 1 capsule (40 mg total) by mouth 2 (two) times daily before a meal.   lamoTRIgine 25 MG tablet Commonly known as:  LAMICTAL TAKE 1 TABLET BY MOUTH DAILY FOR 14 DAYS, THEN 2 TABS DAILY FOR 14 DAYS, THEN 4 TABS DAILY (CALL MD IF RASH AND IF OUT MORE THAN 1 WK DO NOT   levothyroxine 88 MCG tablet Commonly known as:  SYNTHROID, LEVOTHROID Take 88 mcg by mouth daily before breakfast.   lithium carbonate 300 MG CR tablet Commonly known as:  LITHOBID TAKE 1 TABLET BY MOUTH DAILY AT BEDTTIME FOR 4 DAYS, THEN 2 TABS AT BEDTIME FOR 4 DAYS, THEN 3 TABS AT BEDTIME FOR 4 DAYS   mometasone 0.1 % ointment Commonly known as:  ELOCON Can use on affected skin once daily as directed after bathing.   montelukast 10 MG tablet Commonly known as:  SINGULAIR TAKE 1 TABLET BY MOUTH DAILY.   Olopatadine HCl 0.7 % Soln Place 1 drop into both eyes daily as needed (for red, watery, itchy eyes).   ondansetron 4 MG disintegrating tablet Commonly known as:  ZOFRAN-ODT Take 4 mg by mouth.   PRE-NATAL PO Take by mouth daily.   PROAIR HFA 108 (90 Base) MCG/ACT  inhaler Generic drug:  albuterol Inhale 2 puffs into the lungs every 6 (six) hours as needed for wheezing or shortness of breath.   QNASL 80 MCG/ACT Aers Generic drug:  Beclomethasone  Dipropionate Place into the nose. Use 1-2 sprays in each nostril once daily as directed.   SYMBICORT 160-4.5 MCG/ACT inhaler Generic drug:  budesonide-formoterol Inhale 2 puffs into the lungs 2 (two) times daily.   Tiotropium Bromide Monohydrate 1.25 MCG/ACT Aers Inhale 2 Doses into the lungs daily.   traMADol 50 MG tablet Commonly known as:  ULTRAM Take 1 tab by mouth every 6 hours as needed for pain.   Triamcinolone Acetonide 0.025 % Lotn Apply topically. Apply a thin layer to the affected area (s) of skin 2 times per day.   Vitamin D (Ergocalciferol) 50000 units Caps capsule Commonly known as:  DRISDOL Take 50,000 Units by mouth every 7 (seven) days.          Review of Systems  Has history of asthma, currently controlled         Examination:    BP 118/64   Pulse 94   Ht 5' 6"  (1.676 m)   Wt (!) 325 lb (147.4 kg)   LMP 05/04/2017   SpO2 96%   BMI 52.46 kg/m   She looks well Thyroid does not appear enlarged Biceps reflexes are normal  Assessment:  HYPOTHYROIDISM, MILD primary without a goiter and with negative TPO antibodies  Again the patient is taking 88 mg of levothyroxine, this has not been changed since he had a miscarriage  She currently states that she is feeling fairly good and her weight is back to baseline She is taking her prenatal vitamin also at the same time as her levothyroxine as discussed above  Although her last TSH about 4 weeks ago was normal not clear if she is having a change in her requirement because of her weight loss since end of pregnancy     PLAN:   To check TSH again today and decide on dose   Elayne Snare 02/10/2018, 1:22 PM   Addendum: TSH 4.1.  She will continue the same dose but will need to check her again before she tries to  conceive again My chart message sent Elayne Snare     Note: This office note was prepared with Estate agent. Any transcriptional errors that result from this process are unintentional.

## 2018-02-11 NOTE — Telephone Encounter (Signed)
Please advise Dr Panosh, thanks.   

## 2018-02-12 NOTE — Telephone Encounter (Signed)
Advise office visit   With me or her OBgyne to evaluate  No simple answer.

## 2018-02-17 ENCOUNTER — Other Ambulatory Visit: Payer: Self-pay

## 2018-02-17 DIAGNOSIS — R945 Abnormal results of liver function studies: Principal | ICD-10-CM

## 2018-02-17 DIAGNOSIS — R7989 Other specified abnormal findings of blood chemistry: Secondary | ICD-10-CM

## 2018-02-19 ENCOUNTER — Other Ambulatory Visit: Payer: Self-pay

## 2018-02-19 ENCOUNTER — Other Ambulatory Visit (INDEPENDENT_AMBULATORY_CARE_PROVIDER_SITE_OTHER): Payer: 59

## 2018-02-19 DIAGNOSIS — R197 Diarrhea, unspecified: Secondary | ICD-10-CM

## 2018-02-19 DIAGNOSIS — R945 Abnormal results of liver function studies: Secondary | ICD-10-CM

## 2018-02-19 DIAGNOSIS — R7989 Other specified abnormal findings of blood chemistry: Secondary | ICD-10-CM

## 2018-02-19 LAB — FERRITIN: Ferritin: 42.2 ng/mL (ref 10.0–291.0)

## 2018-02-22 ENCOUNTER — Telehealth: Payer: Self-pay | Admitting: Physician Assistant

## 2018-02-22 NOTE — Telephone Encounter (Signed)
CT of the abdomen pelvis is pending I have not met this patient personally as she was seen by Nicoletta Ba, PA-C We need to await abdominal and pelvic CT results before changing therapy Okay to use 2 tramadol up to 100 mg every 8 hours as needed for pain If pain becomes unbearable would need to go to the ER in the meantime, while we are waiting on CT findings/ongoing work-up

## 2018-02-23 ENCOUNTER — Other Ambulatory Visit: Payer: Self-pay

## 2018-02-23 DIAGNOSIS — B977 Papillomavirus as the cause of diseases classified elsewhere: Secondary | ICD-10-CM | POA: Diagnosis not present

## 2018-02-23 DIAGNOSIS — R87612 Low grade squamous intraepithelial lesion on cytologic smear of cervix (LGSIL): Secondary | ICD-10-CM | POA: Diagnosis not present

## 2018-02-23 DIAGNOSIS — N72 Inflammatory disease of cervix uteri: Secondary | ICD-10-CM | POA: Diagnosis not present

## 2018-02-23 DIAGNOSIS — Z3202 Encounter for pregnancy test, result negative: Secondary | ICD-10-CM | POA: Diagnosis not present

## 2018-02-23 DIAGNOSIS — R1013 Epigastric pain: Secondary | ICD-10-CM

## 2018-02-23 MED ORDER — TRAMADOL HCL 50 MG PO TABS: ORAL_TABLET | ORAL | 0 refills | Status: DC

## 2018-02-23 MED FILL — ESOMEPRAZOLE MAG DR 40 MG C: 40 | 30 days supply | Qty: 60 | Fill #3

## 2018-02-23 MED FILL — LEVOTHYROXINE 88 MCG TABLET: 88 | 30 days supply | Qty: 30 | Fill #2

## 2018-02-23 MED FILL — traMADol HCL 50 MG TABS: 50 | 4 days supply | Qty: 30 | Fill #0

## 2018-02-23 MED FILL — ONDANSETRON ODT 4 MG TABLET: 4 | 5 days supply | Qty: 20 | Fill #1

## 2018-02-23 NOTE — Telephone Encounter (Signed)
Faxed Rx to [facility]. Patient is aware.

## 2018-02-23 NOTE — Telephone Encounter (Signed)
yes

## 2018-02-23 NOTE — Telephone Encounter (Signed)
Patient is advised of the recommendation. She is out of Tramadol. Her CT is Thursday. Can the Rx be renewed with new instructions?

## 2018-02-25 ENCOUNTER — Ambulatory Visit (INDEPENDENT_AMBULATORY_CARE_PROVIDER_SITE_OTHER)
Admission: RE | Admit: 2018-02-25 | Discharge: 2018-02-25 | Disposition: A | Discharge status: Home / Self Care | Payer: 59 | Source: Ambulatory Visit | Attending: Physician Assistant | Admitting: Physician Assistant

## 2018-02-25 DIAGNOSIS — R197 Diarrhea, unspecified: Secondary | ICD-10-CM | POA: Diagnosis not present

## 2018-02-25 DIAGNOSIS — R945 Abnormal results of liver function studies: Secondary | ICD-10-CM

## 2018-02-25 DIAGNOSIS — R1110 Vomiting, unspecified: Secondary | ICD-10-CM | POA: Diagnosis not present

## 2018-02-25 DIAGNOSIS — R112 Nausea with vomiting, unspecified: Secondary | ICD-10-CM | POA: Diagnosis not present

## 2018-02-25 MED ORDER — IOPAMIDOL (ISOVUE-300) INJECTION 61%
100.0000 mL | Freq: Once | INTRAVENOUS | Status: AC | PRN
  Administered 2018-02-25: 100 mL via INTRAVENOUS

## 2018-02-25 NOTE — Telephone Encounter (Signed)
Note entered by mistake.

## 2018-02-26 MED FILL — LITHIUM CARBONATE ER 300 MG: 300 | 30 days supply | Qty: 90 | Fill #0

## 2018-03-01 ENCOUNTER — Other Ambulatory Visit: Payer: Self-pay

## 2018-03-01 ENCOUNTER — Ambulatory Visit (HOSPITAL_COMMUNITY): Payer: 59

## 2018-03-01 DIAGNOSIS — R162 Hepatomegaly with splenomegaly, not elsewhere classified: Secondary | ICD-10-CM | POA: Diagnosis not present

## 2018-03-01 DIAGNOSIS — Z79899 Other long term (current) drug therapy: Secondary | ICD-10-CM | POA: Diagnosis not present

## 2018-03-01 DIAGNOSIS — R7989 Other specified abnormal findings of blood chemistry: Secondary | ICD-10-CM

## 2018-03-01 DIAGNOSIS — R945 Abnormal results of liver function studies: Principal | ICD-10-CM

## 2018-03-01 DIAGNOSIS — E669 Obesity, unspecified: Secondary | ICD-10-CM | POA: Diagnosis not present

## 2018-03-01 DIAGNOSIS — R10812 Left upper quadrant abdominal tenderness: Secondary | ICD-10-CM | POA: Diagnosis not present

## 2018-03-02 ENCOUNTER — Ambulatory Visit (INDEPENDENT_AMBULATORY_CARE_PROVIDER_SITE_OTHER): Payer: 59 | Admitting: Internal Medicine

## 2018-03-02 ENCOUNTER — Telehealth: Payer: Self-pay | Admitting: Internal Medicine

## 2018-03-02 ENCOUNTER — Encounter: Payer: Self-pay | Admitting: *Deleted

## 2018-03-02 ENCOUNTER — Ambulatory Visit: Payer: 59 | Admitting: Allergy and Immunology

## 2018-03-02 ENCOUNTER — Other Ambulatory Visit (INDEPENDENT_AMBULATORY_CARE_PROVIDER_SITE_OTHER): Payer: 59

## 2018-03-02 VITALS — BP 106/74 | HR 88 | Ht 66.0 in | Wt 325.0 lb

## 2018-03-02 DIAGNOSIS — J309 Allergic rhinitis, unspecified: Secondary | ICD-10-CM

## 2018-03-02 DIAGNOSIS — R7989 Other specified abnormal findings of blood chemistry: Secondary | ICD-10-CM

## 2018-03-02 DIAGNOSIS — R161 Splenomegaly, not elsewhere classified: Secondary | ICD-10-CM

## 2018-03-02 DIAGNOSIS — R945 Abnormal results of liver function studies: Secondary | ICD-10-CM

## 2018-03-02 DIAGNOSIS — R1084 Generalized abdominal pain: Secondary | ICD-10-CM

## 2018-03-02 DIAGNOSIS — K760 Fatty (change of) liver, not elsewhere classified: Secondary | ICD-10-CM | POA: Diagnosis not present

## 2018-03-02 LAB — HEPATIC FUNCTION PANEL
ALBUMIN: 3.6 g/dL (ref 3.5–5.2)
ALT: 108 U/L — ABNORMAL HIGH (ref 0–35)
AST: 76 U/L — ABNORMAL HIGH (ref 0–37)
Alkaline Phosphatase: 149 U/L — ABNORMAL HIGH (ref 39–117)
BILIRUBIN TOTAL: 0.4 mg/dL (ref 0.2–1.2)
Bilirubin, Direct: 0.1 mg/dL (ref 0.0–0.3)
Total Protein: 6.4 g/dL (ref 6.0–8.3)

## 2018-03-02 LAB — PROTIME-INR
INR: 1.1 ratio — AB (ref 0.8–1.0)
Prothrombin Time: 12.6 s (ref 9.6–13.1)

## 2018-03-02 MED ORDER — ONDANSETRON 4 MG PO TBDP: ORAL_TABLET | ORAL | 1 refills | Status: DC

## 2018-03-02 MED ORDER — TRAMADOL HCL 50 MG PO TABS: ORAL_TABLET | ORAL | 0 refills | Status: DC

## 2018-03-02 MED FILL — traMADol HCL 50 MG TABS: 50 | 8 days supply | Qty: 30 | Fill #0

## 2018-03-02 NOTE — Patient Instructions (Signed)
You have been scheduled for an abdominal ultrasound, dopplers of hepatic artery, hepatic vein and portal vein at [facility]iology (1st floor of hospital) on Thursday, 03/04/18 at 10:00 am. Please arrive 15 minutes prior to your appointment for registration. Make certain not to have anything to eat or drink after midnight on the night before your appointment. Should you need to reschedule your appointment, please contact radiology at [phone]. This test typically takes about 45 minutes to perform.  If you are age 60 or older, your body mass index should be between 23-30. Your Body mass index is 52.46 kg/m. If this is out of the aforementioned range listed, please consider follow up with your Primary Care Provider.  If you are age 4 or younger, your body mass index should be between 19-25. Your Body mass index is 52.46 kg/m. If this is out of the aformentioned range listed, please consider follow up with your Primary Care Provider.

## 2018-03-02 NOTE — Telephone Encounter (Signed)
Verified that script should read 1 tablet every 8 hours as needed for nausea.

## 2018-03-02 NOTE — Progress Notes (Signed)
a 

## 2018-03-02 NOTE — Progress Notes (Signed)
Subjective:    Patient ID: Linda Becker, female    DOB: 02/13/1991, 27 y.o.   MRN: [id number]  HPI Linda Becker is a 27 year old female with a past medical history of obesity, GERD, hypothyroidism, asthma, bipolar 2 disorder who was seen in follow-up.  She is here alone today.  She was initially seen on 02/04/2018 by Nicoletta Ba, PA-C to evaluate persistently elevated liver enzymes.  Unfortunately, [name] had a fetal demise leading to birth of a stillborn child at 65 weeks in July 2019.  She notes that the baby was found to be without signs of life on 12/20/2017 and delivered on 12/22/2017.  This was around week 32 of her pregnancy.  Incidentally she had a prior miscarriage of twins in 2016 at about [redacted] weeks gestation.  Around and after this time she developed abdominal discomfort and nausea.  She also developed a complete body rash felt secondary to ampicillin.  This was delayed reaction as she developed what was felt to be drug rash starting on her lower extremities but spreading diffusely to her trunk and upper extremities.  This was an itchy but also painful rash which lasted several weeks.  It started about 2 weeks after her stillborn delivery and after receiving ampicillin during that hospitalization.  Around the same time she was noted to have elevated liver enzymes which have remained elevated.  She was also told that during pregnancy they were concerned about cholestasis of pregnancy and she was treated beginning around week 24 with ursodiol 500 mg twice daily.  Most recently due to persistently elevated liver enzymes after seeing Amy she had a evaluation which revealed elevated inflammatory markers with ESR and CRP.  Her extensive hepatic work-up was negative except for persistently elevated AST, ALT and slightly elevated alkaline phosphatase.  Her ANA was negative.  See objective portion of this note.  She went on to have a liver ultrasound on 02/09/2018 which showed fatty liver and  mild gallbladder sludge.  This was followed with CT scan of the abdomen pelvis which showed no acute findings but mild to moderate splenomegaly and hepatic steatosis.  This recently she has continued to struggle with bilateral abdominal tenderness primarily in the mid abdomen both on the right and left.  This feels like a "sharp soreness" or like a "bruised feeling in her abdomen".  She can have mid abdominal cramping which seems to radiate somewhat to her back.  This is noted to be sharper than her prior menstrual cramps and also higher up in her abdomen.  She has associated nausea.  She is also recently felt dizzy and somewhat unbalanced with walking.  She is developed also diffuse joint pains which she describes as an ache and a stiffness.  Heat seems to help these joint pains.  She reports feeling like the "tin man in need of oil".  Most recently she has been dealing with some persistent loose stools and using Imodium.  She had hemorrhoids during pregnancy but these have resolved.  No rectal bleeding or melena.  She has noted nosebleeds on 2 occasions.  No gingival bleeding.  No itching.  No jaundice.  She had issues with lower extremity edema during pregnancy which she feels is better.  No definite increasing abdominal girth.  She is using Nexium 40 mg twice daily for GERD which controls her reflux symptoms.  She is on levothyroxine.  She takes lithium and lamotrigine for her bipolar 2 disorder under the direction of Eleonore Chiquito with the  [place].  Family history is notable for a mother with liver disease.  Her mother sees Dr. Jolly Mango.  Mother had liver biopsy, nonspecific fibrosis, not cirrhosis, though imaging suggested cirrhosis.  No definitive etiology.  Mother had low titer AMA and ASMA, but neg for AIH by biopsy.    Review of Systems As per HPI, otherwise negative  Current Medications, Allergies, Past Medical History, Past Surgical History, Family History and Social  History were reviewed in [employer] record.     Objective:   Physical Exam BP 106/74 (BP Location: Left Arm, Patient Position: Sitting, Cuff Size: Normal)   Pulse 88   Ht 5' 6" (1.676 m) Comment: height measured without shoes  Wt (!) 325 lb (147.4 kg)   LMP 02/26/2018   BMI 52.46 kg/m  Constitutional: Well-developed and well-nourished. No distress. HEENT: Normocephalic and atraumatic.   No scleral icterus. Neck: Neck supple. Trachea midline. Cardiovascular: Normal rate, regular rhythm and intact distal pulses. No M/R/G Pulmonary/chest: Effort normal and breath sounds normal. No wheezing, rales or rhonchi. Abdominal: Soft, fused upper and mid abdominal tenderness without rebound or guarding, obese, nondistended. Bowel sounds active throughout.  Lean tip is palpable 2 cm below the left costal margin and spleen is tender Extremities: no clubbing, cyanosis, trace pretibial edema bilaterally Neurological: Alert and oriented to person place and time. Skin: Skin is warm and dry. Psychiatric: Normal mood and affect. Behavior is normal.  CT ABDOMEN AND PELVIS WITH CONTRAST   TECHNIQUE: Multidetector CT imaging of the abdomen and pelvis was performed using the standard protocol following bolus administration of intravenous contrast.   CONTRAST:  138m ISOVUE-300 IOPAMIDOL (ISOVUE-300) INJECTION 61%   COMPARISON:  None.   FINDINGS: Lower Chest: No acute findings.   Hepatobiliary: No hepatic masses identified. Mild-to-moderate diffuse hepatic steatosis. No gross morphologic changes of cirrhosis. Gallbladder is unremarkable. No evidence of biliary ductal dilatation.   Pancreas:  No mass or inflammatory changes.   Spleen: Mild-to-moderate splenomegaly, with spleen measuring approximately 15 cm in length. No splenic lesions identified.   Adrenals/Urinary Tract: No masses identified. No evidence of hydronephrosis.   Stomach/Bowel: No evidence of obstruction,  inflammatory process or abnormal fluid collections. Normal appendix visualized.   Vascular/Lymphatic: Shotty lymph nodes seen within the right abdominal mesentery measuring up to 1 cm. No abdominal aortic aneurysm.   Reproductive:  No mass or other significant abnormality.   Other:  None.   Musculoskeletal:  No suspicious bone lesions identified.   IMPRESSION: No acute findings.   Mild-to-moderate splenomegaly.   Hepatic steatosis.     Electronically Signed   By: JEarle GellM.D.   On: 02/25/2018 12:11  CMP     Component Value Date/Time   NA 139 02/04/2018 1457   NA 138 02/06/2017   K 4.0 02/04/2018 1457   CL 108 02/04/2018 1457   CO2 24 02/04/2018 1457   GLUCOSE 119 (H) 02/04/2018 1457   BUN 16 02/04/2018 1457   BUN 9 02/06/2017   CREATININE 0.94 02/04/2018 1457   CALCIUM 9.4 02/04/2018 1457   PROT 6.4 03/02/2018 0823   ALBUMIN 3.6 03/02/2018 0823   AST 76 (H) 03/02/2018 0823   ALT 108 (H) 03/02/2018 0823   ALKPHOS 149 (H) 03/02/2018 0823   BILITOT 0.4 03/02/2018 0823   GFRNONAA >60 12/20/2017 1035   GFRAA >60 12/20/2017 1035   Lab Results  Component Value Date   INR 1.1 (H) 03/02/2018   INR 1.1 (H) 02/04/2018  Ferritin normal TSH normal Alpha-1 antitrypsin normal ANA negative Anti-smooth muscle antibody negative Mitochondrial antibody negative Ceruloplasmin normal Viral hepatitis studies negative ESR elevated at 68 CRP, high-sensitivity elevated at 23.2      Assessment & Plan:  27 year old female with a past medical history of obesity, GERD, hypothyroidism, asthma, bipolar 2 disorder who was seen in follow-up.  1. Persistently elevated liver enzymes/splenomegaly  --I am concerned about her persistently elevated liver enzymes, abdominal pain and splenomegaly on recent CT scan.  Splenoomegaly suggest there may be an element of portal hypertension.  There is no other clinical evidence of advanced liver disease or cirrhosis.  All of the pregnancy  associated liver enzyme disorders should have resolved and/or be improving given that she is now no longer pregnant.  I am repeating liver enzymes and INR today --I recommended that we pursue urgent Doppler of her hepatic vein, portal vein and hepatic artery to rule out thrombosis --If this is unrevealing then I would recommend liver biopsy --Consideration of hepatology consultation based on findings of the above tests --With drug rash occurring with ampicillin this could be an element of drug-induced liver injury that I would also expect this to be improving by this point --Acute fatty liver of pregnancy was in the differential but again all of her liver enzyme elevation is felt to be postpartum.  Liver enzymes were normal in 2018.  Fatty liver disease seen by [place] and CT and  NASH, which is may have an element of, can cause liver enzyme elevation but should not cause acute splenomegaly. --I asked her to limit acetaminophen to no more than 2 g in a 24-hour period. --Avoid any and all over-the-counter herbal and medical supplements  40 minutes spent with the patient today. Greater than 50% was spent in counseling and coordination of care with the patient

## 2018-03-04 ENCOUNTER — Ambulatory Visit (HOSPITAL_COMMUNITY)
Admission: RE | Admit: 2018-03-04 | Discharge: 2018-03-04 | Disposition: A | Discharge status: Home / Self Care | Payer: 59 | Source: Ambulatory Visit | Attending: Internal Medicine | Admitting: Internal Medicine

## 2018-03-04 DIAGNOSIS — R161 Splenomegaly, not elsewhere classified: Secondary | ICD-10-CM | POA: Diagnosis not present

## 2018-03-04 DIAGNOSIS — K7689 Other specified diseases of liver: Secondary | ICD-10-CM | POA: Diagnosis not present

## 2018-03-04 DIAGNOSIS — K760 Fatty (change of) liver, not elsewhere classified: Secondary | ICD-10-CM | POA: Insufficient documentation

## 2018-03-04 DIAGNOSIS — R7989 Other specified abnormal findings of blood chemistry: Secondary | ICD-10-CM

## 2018-03-04 DIAGNOSIS — R945 Abnormal results of liver function studies: Secondary | ICD-10-CM | POA: Diagnosis not present

## 2018-03-04 DIAGNOSIS — F408 Other phobic anxiety disorders: Secondary | ICD-10-CM | POA: Diagnosis not present

## 2018-03-04 DIAGNOSIS — K828 Other specified diseases of gallbladder: Secondary | ICD-10-CM | POA: Diagnosis not present

## 2018-03-04 DIAGNOSIS — F3181 Bipolar II disorder: Secondary | ICD-10-CM | POA: Diagnosis not present

## 2018-03-04 MED FILL — lamoTRIgine 150 MG TABS: 150 | 90 days supply | Qty: 90 | Fill #0

## 2018-03-04 MED FILL — ONDANSETRON HCL 4 MG TABLET: 4 | 30 days supply | Qty: 30 | Fill #0

## 2018-03-04 MED FILL — PROMETHAZINE 25 MG TABLET: 25 | 10 days supply | Qty: 60 | Fill #0

## 2018-03-05 ENCOUNTER — Other Ambulatory Visit: Payer: Self-pay

## 2018-03-05 DIAGNOSIS — R7989 Other specified abnormal findings of blood chemistry: Secondary | ICD-10-CM

## 2018-03-05 DIAGNOSIS — F314 Bipolar disorder, current episode depressed, severe, without psychotic features: Secondary | ICD-10-CM | POA: Diagnosis not present

## 2018-03-05 DIAGNOSIS — R945 Abnormal results of liver function studies: Principal | ICD-10-CM

## 2018-03-05 DIAGNOSIS — F332 Major depressive disorder, recurrent severe without psychotic features: Secondary | ICD-10-CM | POA: Diagnosis not present

## 2018-03-11 DIAGNOSIS — Z8759 Personal history of other complications of pregnancy, childbirth and the puerperium: Secondary | ICD-10-CM | POA: Diagnosis not present

## 2018-03-11 DIAGNOSIS — Z3169 Encounter for other general counseling and advice on procreation: Secondary | ICD-10-CM | POA: Diagnosis not present

## 2018-03-12 ENCOUNTER — Other Ambulatory Visit: Payer: Self-pay | Admitting: Radiology

## 2018-03-12 ENCOUNTER — Other Ambulatory Visit: Payer: Self-pay | Admitting: Student

## 2018-03-12 ENCOUNTER — Telehealth: Payer: 59 | Admitting: Family

## 2018-03-12 ENCOUNTER — Other Ambulatory Visit: Payer: Self-pay | Admitting: Internal Medicine

## 2018-03-12 DIAGNOSIS — N390 Urinary tract infection, site not specified: Secondary | ICD-10-CM

## 2018-03-12 MED ORDER — NITROFURANTOIN MONOHYD MACRO 100 MG PO CAPS: 100.0000 mg | ORAL_CAPSULE | Freq: Two times a day (BID) | ORAL | 0 refills | Status: DC

## 2018-03-12 MED FILL — traMADol HCL 50 MG TABS: 50 | 8 days supply | Qty: 30 | Fill #0

## 2018-03-12 NOTE — Progress Notes (Signed)
Thank you for the details you included in the comment boxes. Those details are very helpful in determining the best course of treatment for you and help us to provide the best care.  We are sorry that you are not feeling well.  Here is how we plan to help!  Based on what you shared with me it looks like you most likely have a simple urinary tract infection.  A UTI (Urinary Tract Infection) is a bacterial infection of the bladder.  Most cases of urinary tract infections are simple to treat but a key part of your care is to encourage you to drink plenty of fluids and watch your symptoms carefully.  I have prescribed MacroBid 100 mg twice a day for 5 days.  Your symptoms should gradually improve. Call us if the burning in your urine worsens, you develop worsening fever, back pain or pelvic pain or if your symptoms do not resolve after completing the antibiotic.  Urinary tract infections can be prevented by drinking plenty of water to keep your body hydrated.  Also be sure when you wipe, wipe from front to back and don't hold it in!  If possible, empty your bladder every 4 hours.  Your e-visit answers were reviewed by a board certified advanced clinical practitioner to complete your personal care plan.  Depending on the condition, your plan could have included both over the counter or prescription medications.  If there is a problem please reply  once you have received a response from your provider.  Your safety is important to us.  If you have drug allergies check your prescription carefully.    You can use MyChart to ask questions about today's visit, request a non-urgent call back, or ask for a work or school excuse for 24 hours related to this e-Visit. If it has been greater than 24 hours you will need to follow up with your provider, or enter a new e-Visit to address those concerns.   You will get an e-mail in the next two days asking about your experience.  I hope that your e-visit has been  valuable and will speed your recovery. Thank you for using e-visits.    

## 2018-03-15 ENCOUNTER — Encounter (HOSPITAL_COMMUNITY): Payer: Self-pay

## 2018-03-15 ENCOUNTER — Other Ambulatory Visit: Payer: Self-pay

## 2018-03-15 ENCOUNTER — Ambulatory Visit (HOSPITAL_COMMUNITY)
Admission: RE | Admit: 2018-03-15 | Discharge: 2018-03-15 | Disposition: A | Discharge status: Home / Self Care | Payer: 59 | Source: Ambulatory Visit | Attending: Internal Medicine | Admitting: Internal Medicine

## 2018-03-15 DIAGNOSIS — R7989 Other specified abnormal findings of blood chemistry: Secondary | ICD-10-CM

## 2018-03-15 DIAGNOSIS — K7581 Nonalcoholic steatohepatitis (NASH): Secondary | ICD-10-CM | POA: Insufficient documentation

## 2018-03-15 DIAGNOSIS — R945 Abnormal results of liver function studies: Secondary | ICD-10-CM | POA: Diagnosis not present

## 2018-03-15 DIAGNOSIS — R740 Nonspecific elevation of levels of transaminase and lactic acid dehydrogenase [LDH]: Secondary | ICD-10-CM | POA: Diagnosis not present

## 2018-03-15 LAB — CBC
HCT: 43.7 % (ref 36.0–46.0)
Hemoglobin: 13.3 g/dL (ref 12.0–15.0)
MCH: 30.2 pg (ref 26.0–34.0)
MCHC: 30.4 g/dL (ref 30.0–36.0)
MCV: 99.1 fL (ref 78.0–100.0)
PLATELETS: 135 10*3/uL — AB (ref 150–400)
RBC: 4.41 MIL/uL (ref 3.87–5.11)
RDW: 12.5 % (ref 11.5–15.5)
WBC: 6.9 10*3/uL (ref 4.0–10.5)

## 2018-03-15 LAB — PROTIME-INR
INR: 0.97
PROTHROMBIN TIME: 12.8 s (ref 11.4–15.2)

## 2018-03-15 LAB — PREGNANCY, URINE: PREG TEST UR: NEGATIVE

## 2018-03-15 MED ORDER — MIDAZOLAM HCL 2 MG/2ML IJ SOLN
INTRAMUSCULAR | Status: AC | PRN
  Administered 2018-03-15: 1 mg via INTRAVENOUS
  Administered 2018-03-15: 0.5 mg via INTRAVENOUS

## 2018-03-15 MED ORDER — MIDAZOLAM HCL 2 MG/2ML IJ SOLN
INTRAMUSCULAR | Status: AC
  Filled 2018-03-15: qty 4

## 2018-03-15 MED ORDER — FENTANYL CITRATE (PF) 100 MCG/2ML IJ SOLN
INTRAMUSCULAR | Status: AC | PRN
  Administered 2018-03-15 (×2): 50 ug via INTRAVENOUS

## 2018-03-15 MED ORDER — LIDOCAINE HCL (PF) 1 % IJ SOLN
INTRAMUSCULAR | Status: AC
  Filled 2018-03-15: qty 30

## 2018-03-15 MED ORDER — HYDROCODONE-ACETAMINOPHEN 5-325 MG PO TABS
2.0000 | ORAL_TABLET | ORAL | Status: AC
  Administered 2018-03-15: 2 via ORAL
  Filled 2018-03-15: qty 2

## 2018-03-15 MED ORDER — FENTANYL CITRATE (PF) 100 MCG/2ML IJ SOLN
INTRAMUSCULAR | Status: AC
  Filled 2018-03-15: qty 4

## 2018-03-15 MED ORDER — ONDANSETRON HCL 4 MG/2ML IJ SOLN
4.0000 mg | Freq: Once | INTRAMUSCULAR | Status: AC
  Administered 2018-03-15: 4 mg via INTRAVENOUS
  Filled 2018-03-15: qty 2

## 2018-03-15 MED ORDER — SODIUM CHLORIDE 0.9 % IV SOLN
INTRAVENOUS | Status: DC
  Administered 2018-03-15: 14:00:00 via INTRAVENOUS

## 2018-03-15 MED ORDER — ONDANSETRON HCL 4 MG/2ML IJ SOLN
INTRAMUSCULAR | Status: AC
  Filled 2018-03-15: qty 2

## 2018-03-15 MED ORDER — GELATIN ABSORBABLE 12-7 MM EX MISC
CUTANEOUS | Status: AC
  Filled 2018-03-15: qty 1

## 2018-03-15 NOTE — H&P (Signed)
Chief Complaint: Patient was seen in consultation today for liver core biopsy at the request of Pyrtle,Jay M  Referring Physician(s): Pyrtle,Jay M  Supervising Physician: Corrie Mckusick  Patient Status: [facility] - Out-pt  History of Present Illness: Linda Becker is a 27 y.o. female   Elevated liver functions  Known x 4-6 months Apparently some elevation even before pts knowledge Follows with Dr [name] Splenomegaly 9/19/US:  IMPRESSION: 1. Flow in the portal venous structures, hepatic venous structures, superior mesenteric vein, and splenic vein are patent without thrombus or occlusion. Flow in these vessels is in the respective anatomic directions. 2. Liver has a subtly nodular contour concerning for a degree of underlying cirrhosis. The liver echogenicity is increased diffusely. The appearance of the liver is consistent with hepatic cirrhosis, potentially with superimposed hepatic steatosis. While no focal liver lesions are evident on this study, it must be cautioned that the sensitivity of ultrasound for detection of focal liver lesions is diminished significantly in this circumstance 3.  Splenomegaly as noted without focal splenic lesion evident   Dr [name] note 03/02/18: 1. Persistently elevated liver enzymes/splenomegaly  --I am concerned about her persistently elevated liver enzymes, abdominal pain and splenomegaly on recent CT scan.  Splenoomegaly suggest there may be an element of portal hypertension.  There is no other clinical evidence of advanced liver disease or cirrhosis.  All of the pregnancy associated liver enzyme disorders should have resolved and/or be improving given that she is now no longer pregnant.  I am repeating liver enzymes and INR today --I recommended that we pursue urgent Doppler of her hepatic vein, portal vein and hepatic artery to rule out thrombosis --If this is unrevealing then I would recommend liver biopsy --Consideration of hepatology  consultation based on findings of the above tests --With drug rash occurring with ampicillin this could be an element of drug-induced liver injury that I would also expect this to be improving by this point --Acute fatty liver of pregnancy was in the differential but again all of her liver enzyme elevation is felt to be postpartum.  Liver enzymes were normal in 2018.  Fatty liver disease seen by [place] and CT and  [facility], which is may have an element of, can cause liver enzyme elevation but should not cause acute splenomegaly. --I asked her to limit acetaminophen to no more than 2 g in a 24-hour period. --Avoid any and all over-the-counter herbal and medical supplements  Now scheduled for liver core biopsy   Past Medical History:  Diagnosis Date  . Allergic rhinitis    remote hx of allergy shots  . Asthma    since childhood  on controller meds extrinsic dr Carmelina Peal  . Gallbladder sludge   . GERD (gastroesophageal reflux disease)    on nexium  for  long term sx since childhood  . H/O miscarriage, not currently pregnant    [redacted] weeks  march 2016  . Hepatic steatosis   . Hypothyroidism   . Migraine   . Murmur    pt reports MVP  . Syncope    under eval ? cause  . Tachycardia    episodes with near syncope eval dr Nadyne Coombes  on no meds dced LABA    Past Surgical History:  Procedure Laterality Date  . broken right femur  2008   rod placed  . DILATION AND CURETTAGE OF UTERUS    . OB ultrasound N/A 12/01/2017   see report  . TONSILLECTOMY  2006  . WISDOM TOOTH  EXTRACTION      Allergies: Progesterone and Penicillins  Medications: Prior to Admission medications   Medication Sig Start Date End Date Taking? Authorizing Provider  albuterol (PROAIR HFA) 108 (90 Base) MCG/ACT inhaler Inhale 2 puffs into the lungs every 6 (six) hours as needed for wheezing or shortness of breath.   Yes [provider]  Beclomethasone Dipropionate (QNASL) 80 MCG/ACT AERS Place 1 spray into the nose daily.  Use 1-2 sprays in each nostril once daily as directed.    Yes [provider]  budesonide-formoterol (SYMBICORT) 160-4.5 MCG/ACT inhaler Inhale 2 puffs into the lungs 2 (two) times daily.   Yes [provider]  cetirizine (ZYRTEC) 10 MG tablet Take 10 mg by mouth daily.   Yes [provider]  esomeprazole (NEXIUM) 40 MG capsule Take 1 capsule (40 mg total) by mouth 2 (two) times daily before a meal. 02/04/18  Yes Esterwood, Amy S, PA-C  lamoTRIgine (LAMICTAL) 100 MG tablet Take 150 mg by mouth every morning.   Yes [provider]  levothyroxine (SYNTHROID, LEVOTHROID) 88 MCG tablet Take 88 mcg by mouth daily before breakfast.   Yes [provider]  montelukast (SINGULAIR) 10 MG tablet TAKE 1 TABLET BY MOUTH DAILY. Patient taking differently: Take 10 mg by mouth every morning.  12/30/17  Yes Kozlow, Donnamarie Poag, MD  nitrofurantoin, macrocrystal-monohydrate, (MACROBID) 100 MG capsule Take 1 capsule (100 mg total) by mouth 2 (two) times daily. 03/12/18  Yes Withrow, Elyse Jarvis, FNP  Olopatadine HCl (PAZEO) 0.7 % SOLN Place 1 drop into both eyes daily as needed (for red, watery, itchy eyes). 02/04/18  Yes Kozlow, Donnamarie Poag, MD  Prenatal Vit-Fe Fumarate-FA (PRENATAL FORMULA PO) Take 1 tablet by mouth daily.   Yes [provider]  promethazine (PHENERGAN) 25 MG tablet Take 25 mg by mouth every 6 (six) hours as needed for nausea or vomiting.   Yes [provider]  traMADol (ULTRAM) 50 MG tablet TAKE 2 TABLETS BY MOUTH TWO TIMES DAILY AS NEEDED FOR SEVERE PAIN 03/12/18  Yes Pyrtle, [name], MD     Family History  Problem Relation Age of Onset  . Asthma Father   . Arthritis Father   . Hyperlipidemia Father   . Heart disease Father   . Hypertension Father   . Diabetes Father   . Colon polyps Father   . Arthritis Mother   . Hyperlipidemia Mother   . Heart disease Mother   . Hypertension Mother   . Diabetes Mother   . Kidney disease Mother   . Liver  disease Mother   . Bleeding Disorder Mother   . Colon polyps Mother   . Arthritis Maternal Grandmother   . Breast cancer Maternal Grandmother   . Thyroid disease Maternal Grandmother   . Colon cancer Paternal Grandfather   . Diabetes Paternal Grandfather   . Liver cancer Paternal Grandfather   . Stomach cancer Paternal Grandfather   . Breast cancer Paternal Grandmother   . Diabetes Paternal Grandmother   . Breast cancer Paternal Aunt   . Esophageal cancer Paternal Aunt   . Breast cancer Paternal Aunt   . Diabetes Maternal Grandfather   . Pancreatic cancer Maternal Grandfather     Social History   Socioeconomic History  . Marital status: Divorced    Spouse name: Not on file  . Number of children: Not on file  . Years of education: Not on file  . Highest education level: Not on file  Occupational History  .  Not on file  Social Needs  . Financial resource strain: Not on file  . Food insecurity:    Worry: Not on file    Inability: Not on file  . Transportation needs:    Medical: Not on file    Non-medical: Not on file  Tobacco Use  . Smoking status: Never Smoker  . Smokeless tobacco: Never Used  Substance and Sexual Activity  . Alcohol use: No    Alcohol/week: 0.0 standard drinks    Frequency: Never  . Drug use: No  . Sexual activity: Yes    Birth control/protection: None  Lifestyle  . Physical activity:    Days per week: Not on file    Minutes per session: Not on file  . Stress: Not on file  Relationships  . Social connections:    Talks on phone: Not on file    Gets together: Not on file    Attends religious service: Not on file    Active member of club or organization: Not on file    Attends meetings of clubs or organizations: Not on file    Relationship status: Not on file  Other Topics Concern  . 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  All other systems are negative.  Review of Systems  Constitutional: Negative for activity change, fatigue and fever.  Respiratory: Negative for cough and shortness of breath.   Cardiovascular: Negative for chest pain.  Gastrointestinal: Positive for abdominal pain and diarrhea.  Neurological: Negative for weakness.  Psychiatric/Behavioral: Negative for behavioral problems and confusion.    Vital Signs: Pulse 87   Temp 98.8 F (37.1 C)   Resp 18   Ht 5' 6"  (1.676 m)   Wt (!) 321 lb (145.6 kg)   LMP 02/26/2018 (Approximate)   SpO2 99%   BMI 51.81 kg/m   Physical Exam  Constitutional: She is oriented to person, place, and time.  Cardiovascular: Normal rate, regular rhythm and normal heart sounds.  Pulmonary/Chest: Effort normal and breath sounds normal.  Abdominal: Soft. Bowel sounds are normal.  Musculoskeletal: Normal range of motion.  Neurological: She is alert and oriented to person, place, and time.  Skin: Skin is warm and dry.  Psychiatric: She has a normal mood and affect. Her behavior is normal. Judgment and thought content normal.  Vitals reviewed.   Imaging: Ct Abdomen Pelvis W Contrast  Result Date: 02/25/2018 CLINICAL DATA:  Abdominal pain, nausea and vomiting, and diarrhea. Elevated liver function tests. Two months status post still birth. EXAM: CT ABDOMEN AND PELVIS WITH CONTRAST TECHNIQUE: Multidetector CT imaging of the abdomen and pelvis was performed using the standard protocol following bolus administration of intravenous contrast. CONTRAST:  121m ISOVUE-300 IOPAMIDOL (ISOVUE-300) INJECTION 61% COMPARISON:  None. FINDINGS: Lower Chest: No acute findings. Hepatobiliary: No hepatic masses identified. Mild-to-moderate diffuse hepatic steatosis. No gross morphologic changes of  cirrhosis. Gallbladder is unremarkable. No evidence of biliary ductal dilatation. Pancreas:  No mass or inflammatory changes. Spleen: Mild-to-moderate splenomegaly, with spleen measuring approximately 15 cm in length. No splenic lesions identified. Adrenals/Urinary Tract: No masses identified. No  evidence of hydronephrosis. Stomach/Bowel: No evidence of obstruction, inflammatory process or abnormal fluid collections. Normal appendix visualized. Vascular/Lymphatic: Shotty lymph nodes seen within the right abdominal mesentery measuring up to 1 cm. No abdominal aortic aneurysm. Reproductive:  No mass or other significant abnormality. Other:  None. Musculoskeletal:  No suspicious bone lesions identified. IMPRESSION: No acute findings. Mild-to-moderate splenomegaly. Hepatic steatosis. Electronically Signed   By: Earle Gell M.D.   On: 02/25/2018 12:11   US Abdomen Limited  Result Date: 03/04/2018 CLINICAL DATA:  27 year old female with splenomegaly and abnormal LFTs. EXAM: ULTRASOUND ABDOMEN LIMITED RIGHT UPPER QUADRANT COMPARISON:  Abdomen ultrasound 02/09/2018. FINDINGS: Gallbladder: Dependent sludge, but no gallstones or wall thickening visualized. No sonographic Murphy sign noted by sonographer. Common bile duct: Diameter: 4 millimeters, normal. Liver: Echogenic liver (images 31, 42). No discrete liver lesion or intrahepatic biliary ductal dilatation. Portal vein is patent on color Doppler imaging with normal direction of blood flow towards the liver. Other findings: Negative visible right kidney. IMPRESSION: 1. Fatty liver disease. 2. Gallbladder sludge without cholelithiasis, evidence of acute cholecystitis, or biliary obstruction. 3. Liver Doppler ultrasound today reported separately. Electronically Signed   By: Genevie Ann M.D.   On: 03/04/2018 15:59   US Liver Doppler  Result Date: 03/04/2018 CLINICAL DATA:  Splenomegaly.  Abnormal liver enzymes EXAM: DUPLEX ULTRASOUND OF LIVER TECHNIQUE: Color and duplex  Doppler ultrasound was performed to evaluate the hepatic in-flow and out-flow vessels. COMPARISON:  Ultrasound right upper quadrant March 04, 2018 FINDINGS: Liver: There is diffuse increase in liver echogenicity. No focal liver lesions are evident on this study. No focal lesion, mass or intrahepatic biliary ductal dilatation to the extent that these findings can be detected given the degree of underlying diffuse fatty change. Liver contour appears subtly nodular. Portal Vein Velocities Main:  26.4 cm/sec Right:  28.3 cm/sec Left:  18.8 cm/sec Flow in these vessels is in the anatomic direction. Hepatic Vein Velocities Right:  27.2 cm/sec Middle:  37.0 cm/sec Left:  49.7 cm/sec Flow in these vessels is in the anatomic direction. IVC: Present and patent with normal respiratory phasicity. Hepatic Artery Velocity:  89.7 cm/sec Splenic Vein Velocity:  25.8 cm cm/sec Varices: None detected. Ascites: None detected. Spleen measures 17.3 x 12.1 x 17.9 cm with a measured splenic volume of 1,958 cubic cm. No focal splenic lesions are evident. There is no perisplenic fluid or adenopathy. Superior mesenteric vein as a measured velocity of 31.2 centimeters/second. No portal or splenic vein thrombus or occlusion evident. IMPRESSION: 1. Flow in the portal venous structures, hepatic venous structures, superior mesenteric vein, and splenic vein are patent without thrombus or occlusion. Flow in these vessels is in the respective anatomic directions. 2. Liver has a subtly nodular contour concerning for a degree of underlying cirrhosis. The liver echogenicity is increased diffusely. The appearance of the liver is consistent with hepatic cirrhosis, potentially with superimposed hepatic steatosis. While no focal liver lesions are evident on this study, it must be cautioned that the sensitivity of ultrasound for detection of focal liver lesions is diminished significantly in this circumstance. 3.  Splenomegaly as noted without focal  splenic lesion evident. Electronically Signed   By: Lowella Grip III M.D.   On: 03/04/2018 16:13    Labs:  CBC: Recent Labs    12/20/17 1035 12/22/17 0454 02/04/18 1457  WBC 9.3 17.4* 7.6  HGB 11.1* 11.0* 12.8  HCT 33.7* 33.3* 38.4  PLT 138* 180 184.0    COAGS: Recent Labs  02/04/18 1457 03/02/18 0823  INR 1.1* 1.1*    BMP: Recent Labs    12/20/17 1035 02/04/18 1457  NA 134* 139  K 4.2 4.0  CL 107 108  CO2 17* 24  GLUCOSE 111* 119*  BUN 9 16  CALCIUM 8.8* 9.4  CREATININE 0.57 0.94  GFRNONAA >60  --   GFRAA >60  --     LIVER FUNCTION TESTS: Recent Labs    12/20/17 1035 02/04/18 1457 03/02/18 0823  BILITOT 0.3 0.4 0.4  AST 62* 90* 76*  ALT 50* 156* 108*  ALKPHOS 150* 139* 149*  PROT 6.1* 7.0 6.4  ALBUMIN 2.6* 4.0 3.6    TUMOR MARKERS: No results for input(s): AFPTM, CEA, CA199, CHROMGRNA in the last 8760 hours.  Assessment and Plan:  Persistent Elevated liver functions Splenomegaly Scheduled for liver core biopsy Risks and benefits discussed with the patient including, but not limited to bleeding, infection, damage to adjacent structures or low yield requiring additional tests.  All of the patient's questions were answered, patient is agreeable to proceed. Consent signed and in chart.    Thank you for this interesting consult.  I greatly enjoyed meeting [name] and look forward to participating in their care.  A copy of this report was sent to the requesting provider on this date.  Electronically Signed: Lavonia Drafts, PA-C 03/15/2018, 12:29 PM   I spent a total of  30 Minutes   in face to face in clinical consultation, greater than 50% of which was counseling/coordinating care for liver core biopsy

## 2018-03-15 NOTE — Discharge Instructions (Signed)
Liver Biopsy, Care After Refer to this sheet in the next few weeks. These instructions provide you with information on caring for yourself after your procedure. Your health care provider may also give you more specific instructions. Your treatment has been planned according to current medical practices, but problems sometimes occur. Call your health care provider if you have any problems or questions after your procedure. What can I expect after the procedure? After your procedure, it is typical to have the following:  A small amount of discomfort in the area where the biopsy was done and in the right shoulder or shoulder blade.  A small amount of bruising around the area where the biopsy was done and on the skin over the liver.  Sleepiness and fatigue for the rest of the day.  Follow these instructions at home:  Rest at home for 1-2 days or as directed by your health care provider.  Have a friend or family member stay with you for at least 24 hours.  Because of the medicines used during the procedure, you should not do the following things in the first 24 hours: ? Drive. ? Use machinery. ? Be responsible for the care of other people. ? Sign legal documents. ? Take a bath or shower.  There are many different ways to close and cover an incision, including stitches, skin glue, and adhesive strips. Follow your health care provider's instructions on: ? Incision care. ? Bandage (dressing) changes and removal. ? Incision closure removal.  Do not drink alcohol in the first week.  Do not lift more than 5 pounds or play contact sports for 2 weeks after this test.  Take medicines only as directed by your health care provider. Do not take medicine containing aspirin or non-steroidal anti-inflammatory medicines such as ibuprofen for 1 week after this test.  It is your responsibility to get your test results. Contact a health care provider if:  You have increased bleeding from an incision  that results in more than a small spot of blood.  You have redness, swelling, or increasing pain in any incisions.  You notice a discharge or a bad smell coming from any of your incisions.  You have a fever or chills. Get help right away if:  You develop swelling, bloating, or pain in your abdomen.  You become dizzy or faint.  You develop a rash.  You are nauseous or vomit.  You have difficulty breathing, feel short of breath, or feel faint.  You develop chest pain.  You have problems with your speech or vision.  You have trouble balancing or moving your arms or legs. This information is not intended to replace advice given to you by your health care provider. Make sure you discuss any questions you have with your health care provider. Document Released: 12/20/2004 Document Revised: 11/08/2015 Document Reviewed: 07/29/2013 Elsevier Interactive Patient Education  2018 [place]. Moderate Conscious Sedation, Adult, Care After These instructions provide you with information about caring for yourself after your procedure. Your health care provider may also give you more specific instructions. Your treatment has been planned according to current medical practices, but problems sometimes occur. Call your health care provider if you have any problems or questions after your procedure. What can I expect after the procedure? After your procedure, it is common:  To feel sleepy for several hours.  To feel clumsy and have poor balance for several hours.  To have poor judgment for several hours.  To vomit if you eat  too soon.  Follow these instructions at home: For at least 24 hours after the procedure:   Do not: ? Participate in activities where you could fall or become injured. ? Drive. ? Use heavy machinery. ? Drink alcohol. ? Take sleeping pills or medicines that cause drowsiness. ? Make important decisions or sign legal documents. ? Take care of children on your  own.  Rest. Eating and drinking  Follow the diet recommended by your health care provider.  If you vomit: ? Drink water, juice, or soup when you can drink without vomiting. ? Make sure you have little or no nausea before eating solid foods. General instructions  Have a responsible adult stay with you until you are awake and alert.  Take over-the-counter and prescription medicines only as told by your health care provider.  If you smoke, do not smoke without supervision.  Keep all follow-up visits as told by your health care provider. This is important. Contact a health care provider if:  You keep feeling nauseous or you keep vomiting.  You feel light-headed.  You develop a rash.  You have a fever. Get help right away if:  You have trouble breathing. This information is not intended to replace advice given to you by your health care provider. Make sure you discuss any questions you have with your health care provider. Document Released: 03/23/2013 Document Revised: 11/05/2015 Document Reviewed: 09/22/2015 Elsevier Interactive Patient Education  [employer].

## 2018-03-15 NOTE — Sedation Documentation (Signed)
Patient states nausea improving and repositioned for comfort.

## 2018-03-15 NOTE — Procedures (Signed)
Interventional Radiology Procedure Note  Procedure:  US guided liver biopsy, random  Complications: None Recommendations:  - Ok to shower tomorrow - Do not submerge for 7 days - Routine care - 2 hour dc home   Signed,  Dulcy Fanny. Earleen Newport, DO

## 2018-03-19 ENCOUNTER — Other Ambulatory Visit: Payer: Self-pay | Admitting: Allergy and Immunology

## 2018-03-19 MED FILL — LEVOTHYROXINE 88 MCG TABLET: 88 | 30 days supply | Qty: 30 | Fill #3

## 2018-03-19 MED FILL — PROAIR HFA 90 MCG INHALER: 108 (90 BAS | 25 days supply | Qty: 9 | Fill #0

## 2018-03-19 MED FILL — QNASL 80 MCG/ACT AERS: 80 | 30 days supply | Qty: 11 | Fill #4

## 2018-03-22 ENCOUNTER — Other Ambulatory Visit: Payer: Self-pay | Admitting: Internal Medicine

## 2018-03-22 ENCOUNTER — Telehealth: Payer: Self-pay | Admitting: Internal Medicine

## 2018-03-22 NOTE — Telephone Encounter (Signed)
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  I also recommended that through diet, calorie restriction, exercise that she attempt to lose weight.  Weight loss through diet and exercise has been proven to improve fatty liver inflammation.  I also recommended that she avoid all alcohol. Would like her to come on 04/01/2018 for repeat hepatic function panel also INR. She will come to have these labs drawn  She continues to have mid and upper abdominal pain which is sharp in nature.  Tends to be worse in the morning.  Occasionally worse after eating.  Still having nausea.  Using Nexium 40 mg twice daily AC which she says has helped. Been using tramadol and requests more.  I would like to get her off tramadol as I do not think this is a good option for pain management on a chronic basis.  She understands this. Therefore will not renew tramadol but rather try Bentyl 20 mg 3 times daily as needed for mid and upper abdominal pain and spasm Splenomegaly is yet to be explained though if persistent may need to see hematology If upper abdominal pain fails to improve we will consider an upper endoscopy  Please get her office follow-up next available for continuity We will see how she responds to Bentyl and follow-up liver enzymes next week.  She will start vitamin E therapy.  She will continue Nexium twice daily for now  She has seen maternal-fetal medicine about getting pregnant but my opinion would be to wait until liver enzymes improve and pain improves before actively pursuing repeat pregnancy

## 2018-03-22 NOTE — Telephone Encounter (Signed)
Pt calling for liver bx results, please advise.

## 2018-03-22 NOTE — Telephone Encounter (Signed)
Dr [name], patient is requesting refills of tramadol. Has gotten #30 on 02/23/18, # 30 on 03/02/18, #30 on 03/12/18. Script reads: "Take 2 tablets by mouth twice daily as needed for severe pain." Please advise, do you want me to refill patient's tramadol at this time? If so, do you want same instructions?

## 2018-03-22 NOTE — Telephone Encounter (Signed)
Please see my telephone note from today regarding tramadol refill

## 2018-03-23 ENCOUNTER — Other Ambulatory Visit: Payer: Self-pay

## 2018-03-23 DIAGNOSIS — R7989 Other specified abnormal findings of blood chemistry: Secondary | ICD-10-CM

## 2018-03-23 DIAGNOSIS — R945 Abnormal results of liver function studies: Principal | ICD-10-CM

## 2018-03-23 MED ORDER — DICYCLOMINE HCL 20 MG PO TABS: 20.0000 mg | ORAL_TABLET | Freq: Three times a day (TID) | ORAL | 2 refills | Status: DC

## 2018-03-23 MED FILL — DICYCLOMINE 20 MG TABLET: 20 | 30 days supply | Qty: 90 | Fill #0

## 2018-03-23 MED FILL — ESOMEPRAZOLE MAG DR 40 MG C: 40 | 30 days supply | Qty: 60 | Fill #0

## 2018-03-23 NOTE — Telephone Encounter (Signed)
Lab orders in epic, script sent to pharmacy. Pt scheduled to see Dr. [name] 05/04/18@3pm . Appt letter mailed to pt.

## 2018-03-30 ENCOUNTER — Other Ambulatory Visit: Payer: Self-pay | Admitting: Allergy and Immunology

## 2018-03-30 MED FILL — LITHIUM CARBONATE ER 300 MG: 300 | 30 days supply | Qty: 90 | Fill #1

## 2018-03-30 MED FILL — SYMBICORT 160-4.5 MCG INH: 160-4.5 | 30 days supply | Qty: 10 | Fill #4

## 2018-03-30 MED FILL — MONTELUKAST SOD 10 MG TAB: 10 | 90 days supply | Qty: 90 | Fill #0

## 2018-04-01 ENCOUNTER — Other Ambulatory Visit (INDEPENDENT_AMBULATORY_CARE_PROVIDER_SITE_OTHER): Payer: 59

## 2018-04-01 DIAGNOSIS — R7989 Other specified abnormal findings of blood chemistry: Secondary | ICD-10-CM

## 2018-04-01 DIAGNOSIS — R945 Abnormal results of liver function studies: Secondary | ICD-10-CM | POA: Diagnosis not present

## 2018-04-01 LAB — HEPATIC FUNCTION PANEL
ALBUMIN: 4.1 g/dL (ref 3.5–5.2)
ALT: 104 U/L — AB (ref 0–35)
AST: 95 U/L — AB (ref 0–37)
Alkaline Phosphatase: 155 U/L — ABNORMAL HIGH (ref 39–117)
Bilirubin, Direct: 0.1 mg/dL (ref 0.0–0.3)
TOTAL PROTEIN: 7.3 g/dL (ref 6.0–8.3)
Total Bilirubin: 0.4 mg/dL (ref 0.2–1.2)

## 2018-04-01 LAB — PROTIME-INR
INR: 1.2 ratio — AB (ref 0.8–1.0)
Prothrombin Time: 13.5 s — ABNORMAL HIGH (ref 9.6–13.1)

## 2018-04-06 ENCOUNTER — Ambulatory Visit (INDEPENDENT_AMBULATORY_CARE_PROVIDER_SITE_OTHER): Payer: Self-pay | Admitting: Physician Assistant

## 2018-04-06 VITALS — BP 124/86 | HR 97 | Temp 98.5°F | Resp 18 | Wt 324.5 lb

## 2018-04-06 DIAGNOSIS — R062 Wheezing: Secondary | ICD-10-CM

## 2018-04-06 DIAGNOSIS — J069 Acute upper respiratory infection, unspecified: Secondary | ICD-10-CM

## 2018-04-06 MED ORDER — PREDNISONE 20 MG PO TABS: 40.0000 mg | ORAL_TABLET | Freq: Every day | ORAL | 0 refills | Status: DC

## 2018-04-06 MED ORDER — AZITHROMYCIN 250 MG PO TABS: ORAL_TABLET | ORAL | 0 refills | Status: AC

## 2018-04-06 MED FILL — AZITHROMYCIN 250 MG TABLET: 250 | 5 days supply | Qty: 6 | Fill #0

## 2018-04-06 MED FILL — predniSONE 20 MG TABS: 20 | 5 days supply | Qty: 10 | Fill #0

## 2018-04-06 NOTE — Progress Notes (Signed)
04/06/2018 12:48 PM   DOB: 10/03/1990 / MRN: [id number]  SUBJECTIVE:  Linda Becker is a 27 y.o. female presenting for post nasal drip for the last two weeks and worsening. She has a history of asthma.  She has tried several OTC preps without relief. .  She is allergic to progesterone and penicillins.   She  has a past medical history of Allergic rhinitis, Asthma, Gallbladder sludge, GERD (gastroesophageal reflux disease), H/O miscarriage, not currently pregnant, Hepatic steatosis, Hypothyroidism, Migraine, Murmur, Syncope, and Tachycardia.    She  reports that she has never smoked. She has never used smokeless tobacco. She reports that she does not drink alcohol or use drugs. She  reports that she currently engages in sexual activity. She reports using the following method of birth control/protection: None. The patient  has a past surgical history that includes broken right femur (2008); Tonsillectomy (2006); Wisdom tooth extraction; Dilation and curettage of uterus; and OB ultrasound (N/A, 12/01/2017).  Her family history includes Arthritis in her father, maternal grandmother, and mother; Asthma in her father; Bleeding Disorder in her mother; Breast cancer in her maternal grandmother, paternal aunt, paternal aunt, and paternal grandmother; Colon cancer in her paternal grandfather; Colon polyps in her father and mother; Diabetes in her father, maternal grandfather, mother, paternal grandfather, and paternal grandmother; Esophageal cancer in her paternal aunt; Heart disease in her father and mother; Hyperlipidemia in her father and mother; Hypertension in her father and mother; Kidney disease in her mother; Liver cancer in her paternal grandfather; Liver disease in her mother; Pancreatic cancer in her maternal grandfather; Stomach cancer in her paternal grandfather; Thyroid disease in her maternal grandmother.  Review of Systems  Constitutional: Negative for chills, diaphoresis and fever.    HENT: Positive for congestion and sore throat.   Respiratory: Positive for cough, shortness of breath and wheezing. Negative for hemoptysis and sputum production.   Cardiovascular: Negative for chest pain, orthopnea and leg swelling.  Gastrointestinal: Negative for nausea.  Skin: Negative for rash.  Neurological: Negative for dizziness and headaches.    The problem list and medications were reviewed and updated by myself where necessary and exist elsewhere in the encounter.   OBJECTIVE:  BP 124/86 (BP Location: Right Arm, Patient Position: Sitting, Cuff Size: Large)   Pulse 97   Temp 98.5 F (36.9 C) (Oral)   Resp 18   Wt (!) 324 lb 8 oz (147.2 kg)   SpO2 97%   BMI 52.38 kg/m   Wt Readings from Last 3 Encounters:  04/06/18 (!) 324 lb 8 oz (147.2 kg)  03/15/18 (!) 321 lb (145.6 kg)  03/02/18 (!) 325 lb (147.4 kg)   Temp Readings from Last 3 Encounters:  04/06/18 98.5 F (36.9 C) (Oral)  03/15/18 98.8 F (37.1 C)  12/23/17 98.4 F (36.9 C) (Oral)   BP Readings from Last 3 Encounters:  04/06/18 124/86  03/15/18 127/84  03/02/18 106/74   Pulse Readings from Last 3 Encounters:  04/06/18 97  03/15/18 90  03/02/18 88    Physical Exam  Constitutional: She is oriented to person, place, and time. She appears well-nourished. No distress.  Eyes: Pupils are equal, round, and reactive to light. EOM are normal.  Cardiovascular: Normal rate, regular rhythm, S1 normal, S2 normal, normal heart sounds and intact distal pulses. Exam reveals no gallop, no friction rub and no decreased pulses.  No murmur heard. Pulmonary/Chest: Effort normal. She has wheezes (diffuse, faint and end expiratory). She has no rales.  Abdominal: She exhibits no distension.  Musculoskeletal: She exhibits no edema.  Neurological: She is alert and oriented to person, place, and time. No cranial nerve deficit. Gait normal.  Skin: Skin is dry. She is not diaphoretic.  Psychiatric: She has a normal mood and  affect.  Vitals reviewed.   Lab Results  Component Value Date   HGBA1C 5.1 09/05/2015    Lab Results  Component Value Date   WBC 6.9 03/15/2018   HGB 13.3 03/15/2018   HCT 43.7 03/15/2018   MCV 99.1 03/15/2018   PLT 135 (L) 03/15/2018    Lab Results  Component Value Date   CREATININE 0.94 02/04/2018   BUN 16 02/04/2018   NA 139 02/04/2018   K 4.0 02/04/2018   CL 108 02/04/2018   CO2 24 02/04/2018    Lab Results  Component Value Date   ALT 104 (H) 04/01/2018   AST 95 (H) 04/01/2018   ALKPHOS 155 (H) 04/01/2018   BILITOT 0.4 04/01/2018    Lab Results  Component Value Date   TSH 4.13 02/10/2018    Lab Results  Component Value Date   CHOL 183 09/05/2015   HDL 50.30 09/05/2015   LDLCALC 117 (H) 09/05/2015   TRIG 78.0 09/05/2015   CHOLHDL 4 09/05/2015     ASSESSMENT AND PLAN:  Linda Becker was seen today for chest and nasal congestion.  Diagnoses and all orders for this visit:  Protracted URI -     azithromycin (ZITHROMAX) 250 MG tablet; Take two tabs on day on and one daily thereafter.  Wheezing -     predniSONE (DELTASONE) 20 MG tablet; Take 2 tablets (40 mg total) by mouth daily with breakfast.    The patient is advised to call or return to clinic if she does not see an improvement in symptoms, or to seek the care of the closest emergency department if she worsens with the above plan.   Philis Fendt, MHS, PA-C [facility] 04/06/2018 12:48 PM

## 2018-04-08 ENCOUNTER — Telehealth: Payer: Self-pay

## 2018-04-08 NOTE — Telephone Encounter (Signed)
Patient states she is doing much better.

## 2018-04-20 ENCOUNTER — Other Ambulatory Visit: Payer: Self-pay | Admitting: Endocrinology

## 2018-04-20 MED FILL — LEVOTHYROXINE 88 MCG TABLET: 88 | 30 days supply | Qty: 30 | Fill #0

## 2018-04-20 MED FILL — PROAIR HFA 90 MCG INHALER: 108 (90 BAS | 25 days supply | Qty: 9 | Fill #1

## 2018-04-20 MED FILL — ESOMEPRAZOLE MAG DR 40 MG C: 40 | 30 days supply | Qty: 60 | Fill #1

## 2018-04-20 MED FILL — DICYCLOMINE 20 MG TABLET: 20 | 30 days supply | Qty: 90 | Fill #1

## 2018-04-28 ENCOUNTER — Encounter: Payer: Self-pay | Admitting: *Deleted

## 2018-04-28 DIAGNOSIS — F314 Bipolar disorder, current episode depressed, severe, without psychotic features: Secondary | ICD-10-CM | POA: Diagnosis not present

## 2018-04-28 DIAGNOSIS — F332 Major depressive disorder, recurrent severe without psychotic features: Secondary | ICD-10-CM | POA: Diagnosis not present

## 2018-05-04 ENCOUNTER — Ambulatory Visit: Payer: 59 | Admitting: Internal Medicine

## 2018-05-04 ENCOUNTER — Other Ambulatory Visit: Payer: Self-pay | Admitting: Allergy and Immunology

## 2018-05-04 MED FILL — QNASL 80 MCG/ACT AERS: 80 | 30 days supply | Qty: 11 | Fill #0

## 2018-05-14 DIAGNOSIS — F332 Major depressive disorder, recurrent severe without psychotic features: Secondary | ICD-10-CM | POA: Diagnosis not present

## 2018-05-14 DIAGNOSIS — F314 Bipolar disorder, current episode depressed, severe, without psychotic features: Secondary | ICD-10-CM | POA: Diagnosis not present

## 2018-05-14 MED FILL — LITHIUM CARBONATE ER 300 MG: 300 | 30 days supply | Qty: 90 | Fill #0

## 2018-05-18 ENCOUNTER — Ambulatory Visit: Payer: 59 | Admitting: Physician Assistant

## 2018-05-18 ENCOUNTER — Telehealth: Payer: Self-pay | Admitting: *Deleted

## 2018-05-18 NOTE — Telephone Encounter (Signed)
Opened phone note in error.

## 2018-05-19 ENCOUNTER — Ambulatory Visit: Payer: 59 | Admitting: Physician Assistant

## 2018-05-21 MED FILL — ESOMEPRAZOLE MAG DR 40 MG C: 40 | 30 days supply | Qty: 60 | Fill #2

## 2018-05-21 MED FILL — SYMBICORT 160-4.5 MCG INH: 160-4.5 | 30 days supply | Qty: 10 | Fill #5

## 2018-05-21 MED FILL — LEVOTHYROXINE 88 MCG TABLET: 88 | 30 days supply | Qty: 30 | Fill #1

## 2018-05-24 ENCOUNTER — Telehealth: Payer: Self-pay | Admitting: *Deleted

## 2018-05-24 NOTE — Telephone Encounter (Signed)
Please see if patient can be seen by Nicoletta Ba, PA-C ASAP

## 2018-05-24 NOTE — Telephone Encounter (Signed)
Patient called states she is having a reaction on arm states its like a rash, red itching x 3days. States this happened before after getting ampicillin after having son in June and lasted for 1 month. appt made for 05/28/18 @ 1115 in [place] with Dr Ernst Bowler.

## 2018-05-25 ENCOUNTER — Other Ambulatory Visit: Payer: Self-pay | Admitting: *Deleted

## 2018-05-25 MED ORDER — OLOPATADINE HCL 0.1 % OP SOLN: OPHTHALMIC | 5 refills | Status: DC

## 2018-05-26 ENCOUNTER — Ambulatory Visit (INDEPENDENT_AMBULATORY_CARE_PROVIDER_SITE_OTHER): Payer: 59 | Admitting: Physician Assistant

## 2018-05-26 ENCOUNTER — Other Ambulatory Visit (INDEPENDENT_AMBULATORY_CARE_PROVIDER_SITE_OTHER): Payer: 59

## 2018-05-26 ENCOUNTER — Encounter: Payer: Self-pay | Admitting: Physician Assistant

## 2018-05-26 VITALS — BP 108/74 | HR 86 | Ht 66.0 in | Wt 331.2 lb

## 2018-05-26 DIAGNOSIS — R197 Diarrhea, unspecified: Secondary | ICD-10-CM

## 2018-05-26 DIAGNOSIS — R1032 Left lower quadrant pain: Secondary | ICD-10-CM

## 2018-05-26 DIAGNOSIS — R1031 Right lower quadrant pain: Secondary | ICD-10-CM

## 2018-05-26 DIAGNOSIS — K7581 Nonalcoholic steatohepatitis (NASH): Secondary | ICD-10-CM

## 2018-05-26 DIAGNOSIS — G8929 Other chronic pain: Secondary | ICD-10-CM

## 2018-05-26 DIAGNOSIS — E039 Hypothyroidism, unspecified: Secondary | ICD-10-CM

## 2018-05-26 DIAGNOSIS — R109 Unspecified abdominal pain: Secondary | ICD-10-CM

## 2018-05-26 LAB — IGA: IgA: 72 mg/dL (ref 68–378)

## 2018-05-26 LAB — SEDIMENTATION RATE: Sed Rate: 17 mm/hr (ref 0–20)

## 2018-05-26 LAB — TSH: TSH: 3.53 u[IU]/mL (ref 0.35–4.50)

## 2018-05-26 LAB — T4, FREE: Free T4: 0.99 ng/dL (ref 0.60–1.60)

## 2018-05-26 MED ORDER — NA SULFATE-K SULFATE-MG SULF 17.5-3.13-1.6 GM/177ML PO SOLN: ORAL | 0 refills | Status: DC

## 2018-05-26 MED ORDER — DICYCLOMINE HCL 20 MG PO TABS: ORAL_TABLET | ORAL | 3 refills | Status: DC

## 2018-05-26 NOTE — Progress Notes (Addendum)
 Subjective:    Patient ID: Linda Becker, female    DOB: 04/21/1991, 27 y.o.   MRN: [id number]  HPI Linda Becker is a pleasant 27-year-old white female, known to Dr. Pyrtle and myself who was initially seen in August 2019 for persistently elevated LFTs.  This was in the setting of fetal demise, with delivery of a stillborn child at 32 weeks in July 2019. Since then she has undergone extensive evaluation including multiple hepatic markers and serologies.  She was noted to have an elevated sed rate and CRP but autoimmune markers negative.  CT showed mild to moderate splenomegaly and hepatic steatosis. She also had ultrasound with Dopplers showing portal system to be patent.  She underwent liver biopsy on 03/15/2018 which showed steatohepatitis, prominent perilobular fibrosis and rare focus of central portal fibrosis.  She was given a Nash score of 6/7.  She was not noted to have any increased iron stores and no alpha 1 antitrypsin deposits. She has started on vitamin E 800 units daily.  Is also been advised regarding importance of weight loss and management of Nash, as well as exercise. She comes back in today for follow-up.  Most recent LFTs October 2017 with alk phos of 155 AST of 95 and ALT of 104.  She would like to get pregnant again has had some initial perinatal counseling.  The also has appointment to be seen by hepatology here in [place]/[facility].  Today she is primarily concerned about ongoing problems with abdominal cramping pain and diarrhea.  She says her diarrhea has been worse over the past 6 months, she also has chronic low back pain.  She has been using Tylenol and ibuprofen.  She says she is usually taking up to 6 Tylenol per day and a couple of ibuprofen. She is also been taking 2 Imodium every morning and then up to 4 additional tablets later in the day.  She has been taking Bentyl 10 mg p.o. 3 times daily and despite this is having multiple bowel movements per day generally up to  10.  She has not noticed any melena or hematochezia.  She says she is not having any formed stools and all of her stools are loose.  She also has had early morning nausea without vomiting. She is not been started on any other new medications other than vitamin E, last antibiotics were 3 to 4 months ago. She also suffers from chronic GERD and has been on twice daily Nexium long-term.  Review of Systems Pertinent positive and negative review of systems were noted in the above HPI section.  All other review of systems was otherwise negative.  Outpatient Encounter Medications as of 05/26/2018  Medication Sig  . albuterol (PROAIR HFA) 108 (90 Base) MCG/ACT inhaler Inhale 2 puffs into the lungs every 6 (six) hours as needed for wheezing or shortness of breath.  . Beclomethasone Dipropionate (QNASL) 80 MCG/ACT AERS Use one puff in each nostril once daily as directed.  . budesonide-formoterol (SYMBICORT) 160-4.5 MCG/ACT inhaler Inhale 2 puffs into the lungs 2 (two) times daily.  . cetirizine (ZYRTEC) 10 MG tablet Take 10 mg by mouth daily.  . dicyclomine (BENTYL) 20 MG tablet Take 10 mg by mouth every 4-6 hours as needed.  . esomeprazole (NEXIUM) 40 MG capsule Take 1 capsule (40 mg total) by mouth 2 (two) times daily before a meal.  . lamoTRIgine (LAMICTAL) 100 MG tablet Take 150 mg by mouth every morning.  . levothyroxine (SYNTHROID, LEVOTHROID) 88 MCG tablet TAKE   1 TABLET BY MOUTH DAILY.  . montelukast (SINGULAIR) 10 MG tablet TAKE 1 TABLET BY MOUTH DAILY.  . olopatadine (PATANOL) 0.1 % ophthalmic solution Use one drop in each eye twice daily if needed  . ondansetron (ZOFRAN-ODT) 4 MG disintegrating tablet Take 4 mg by mouth every 8 (eight) hours as needed for nausea or vomiting.  . Prenatal Vit-Fe Fumarate-FA (PRENATAL FORMULA PO) Take 1 tablet by mouth daily.  . PROAIR HFA 108 (90 Base) MCG/ACT inhaler INHALE 2 PUFFS BY MOUTH EVERY 6 HOURS AS NEEDED FOR WHEEZING OR SHORTNESS OF BREATH.  .  promethazine (PHENERGAN) 25 MG tablet Take 25 mg by mouth every 6 (six) hours as needed for nausea or vomiting.  . vitamin E 400 UNIT capsule Take 400 Units by mouth 2 (two) times daily.  . [DISCONTINUED] dicyclomine (BENTYL) 20 MG tablet Take 1 tablet (20 mg total) by mouth 3 (three) times daily before meals.  . Na Sulfate-K Sulfate-Mg Sulf 17.5-3.13-1.6 GM/177ML SOLN Take as directed for colonoscopy prep.  . [DISCONTINUED] Beclomethasone Dipropionate (QNASL) 80 MCG/ACT AERS Place 1 spray into the nose daily. Use 1-2 sprays in each nostril once daily as directed.   . [DISCONTINUED] budesonide-formoterol (SYMBICORT) 160-4.5 MCG/ACT inhaler Inhale 2 puffs into the lungs 2 (two) times daily.  . [DISCONTINUED] levothyroxine (SYNTHROID, LEVOTHROID) 88 MCG tablet Take 88 mcg by mouth daily before breakfast.  . [DISCONTINUED] nitrofurantoin, macrocrystal-monohydrate, (MACROBID) 100 MG capsule Take 1 capsule (100 mg total) by mouth 2 (two) times daily.  . [DISCONTINUED] predniSONE (DELTASONE) 20 MG tablet Take 2 tablets (40 mg total) by mouth daily with breakfast.  . [DISCONTINUED] traMADol (ULTRAM) 50 MG tablet TAKE 2 TABLETS BY MOUTH TWO TIMES DAILY AS NEEDED FOR SEVERE PAIN (Patient not taking: Reported on 04/06/2018)   No facility-administered encounter medications on file as of 05/26/2018.    Allergies  Allergen Reactions  . Progesterone Rash    Was in a form of birth control.  . Penicillins Rash    Has patient had a PCN reaction causing immediate rash, facial/tongue/throat swelling, SOB or lightheadedness with hypotension: no Has patient had a PCN reaction causing severe rash involving mucus membranes or skin necrosis:  no Has patient had a PCN reaction that required hospitalization: no Has patient had a PCN reaction occurring within the last 10 years: yes If all of the above answers are "NO", then may proceed with Cephalosporin use.    Patient Active Problem List   Diagnosis Date Noted  .  Spontaneous vaginal delivery 12/22/2017  . IUFD at 20 weeks or more of gestation 12/20/2017  . Snoring 11/10/2017  . Gestational diabetes 10/12/2017  . Morbid obesity (HCC) 05/19/2017  . Adult hypothyroidism 05/19/2017  . BMI 40.0-44.9, adult (HCC) 11/09/2015  . GERD (gastroesophageal reflux disease)   . Tachycardia   . Syncope   . Allergic rhinitis   . Persistent asthma with undetermined severity   . Migraine with aura 09/26/2011   Social History   Socioeconomic History  . Marital status: Divorced    Spouse name: Not on file  . Number of children: Not on file  . Years of education: Not on file  . Highest education level: Not on file  Occupational History  . Not on file  Social Needs  . Financial resource strain: Not on file  . Food insecurity:    Worry: Not on file    Inability: Not on file  . Transportation needs:    Medical: Not on file      Non-medical: Not on file  Tobacco Use  . Smoking status: Never Smoker  . Smokeless tobacco: Never Used  Substance and Sexual Activity  . Alcohol use: No    Alcohol/week: 0.0 standard drinks    Frequency: Never  . Drug use: No  . Sexual activity: Yes    Birth control/protection: None  Lifestyle  . Physical activity:    Days per week: Not on file    Minutes per session: Not on file  . Stress: Not on file  Relationships  . Social connections:    Talks on phone: Not on file    Gets together: Not on file    Attends religious service: Not on file    Active member of club or organization: Not on file    Attends meetings of clubs or organizations: Not on file    Relationship status: Not on file  . Intimate partner violence:    Fear of current or ex partner: Not on file    Emotionally abused: Not on file    Physically abused: Not on file    Forced sexual activity: Not on file  Other Topics Concern  . 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 Mish's family history includes Arthritis in her father, maternal grandmother, and mother; Asthma in her father; Bleeding Disorder in her mother; Breast cancer in her maternal grandmother, paternal aunt, paternal aunt, and paternal grandmother; Colon cancer in her paternal grandfather; Colon polyps in her father and mother; Diabetes in her father, maternal grandfather, mother, paternal grandfather, and paternal grandmother; Esophageal cancer in her paternal aunt; Heart disease in her father and mother; Hyperlipidemia in her father and mother; Hypertension in her father and mother; Kidney disease in her mother; Liver cancer in her paternal grandfather; Liver disease in her mother; Pancreatic cancer in her maternal grandfather; Stomach cancer in her paternal grandfather; Thyroid disease in her maternal grandmother.      Objective:    Vitals:   05/26/18 1415  BP: 108/74  Pulse: 86    Physical Exam; well-developed young white female in no acute distress, pleasant.  Blood pressure 108/74, pulse 86, height 5 foot 6, weight 331, BMI of 53.4.  HEENT; nontraumatic normocephalic EOMI PERRLA sclera and anicteric oral mucosa moist, Cardiovascular; regular rate and rhythm with S1-S2, Pulmonary ;clear bilaterally, Abdomen ;obese, bowel sounds are active, no palpable mass or hepatosplenomegaly, she is tender bilateral lower quadrants and bilateral mid quadrants no guarding.  Rectal ;exam not done, Extremities; no clubbing cyanosis or edema skin warm and dry, Neuropsych ;alert and oriented, grossly nonfocal mood and affect appropriate       Assessment & Plan:   #81 27 year old morbidly obese white female with NASH documented by recent liver biopsy, Nash score 6/7  Patient is on vitamin E 800 international units daily  #2 chronic GERD on twice daily PPI stable #3  10-monthhistory  of persistent and somewhat progressive diarrhea and ongoing abdominal discomfort and cramping..  Patient is having multiple bowel movements per day despite regular use of Imodium up to 6 tablets daily and Bentyl 3 times daily. Etiology of diarrhea and abdominal cramping is not clear, no evidence of BD by  prior imaging.  Will need to consider underlying IBD, rule out celiac disease, rule out severe IBS D  #4 chronic back pain #5 history of previous miscarriage and fetal demise at 32 weeks July 2019. Patient desires pregnancy . #6 bipolar disorder  Plan; sed rate, TTG and IgA GI path panel stool for lactoferrin Scheduled for colonoscopy with Dr. Pyrtle.  This will intentionally be scheduled at least 3 weeks out to allow time to result stool studies, and treat if indicated.  Procedure was discussed in detail with patient including indications risks and benefits and she is agreeable to proceed. For now she can continue up to 8 Imodium per day, and continue Bentyl 10 mg p.o. every 4-6 hours as needed. Strongly encouraged her to decrease her usage of Tylenol, to no more than 2 g daily. Continue vitamin D 800 IU daily.  Amy S Esterwood PA-C 05/26/2018   Cc: Panosh, Wanda K, MD  Addendum: Reviewed and agree with assessment and management plan. Will need outpatient hospital setting due to BMI>50 Pyrtle, Jay M, MD   

## 2018-05-26 NOTE — Patient Instructions (Addendum)
Your provider has requested that you go to the basement level for lab work and stool studies before leaving today. Press "B" on the elevator. The lab is located at the first door on the left as you exit the elevator.  Take Nexium 40 mg by mouth twice daily.;  Bentyl 10 mg Take 1 tablet every 4-6 hours as needed.  Continue Imodium as needed.  Continue Vit E 800 IU daily.  Decrease Tylenol & Ibuprofen   You have been scheduled for a colonoscopy. Please follow written instructions given to you at your visit today.  Please pick up your prep supplies at the pharmacy within the next 1-3 days. If you use inhalers (even only as needed), please bring them with you on the day of your procedure. Your physician has requested that you go to www.startemmi.com and enter the access code given to you at your visit today. This web site gives a general overview about your procedure. However, you should still follow specific instructions given to you by our office regarding your preparation for the procedure.  Normal BMI (Body Mass Index- based on height and weight) is between 19 and 25. Your BMI today is Body mass index is 53.46 kg/m. Marland Kitchen Please consider follow up  regarding your BMI with your Primary Care Provider.

## 2018-05-27 ENCOUNTER — Ambulatory Visit: Payer: 59 | Admitting: Physician Assistant

## 2018-05-27 LAB — TISSUE TRANSGLUTAMINASE ABS,IGG,IGA
(TTG) AB, IGA: 1 U/mL
(tTG) Ab, IgG: 1 U/mL

## 2018-05-27 LAB — THYROID PEROXIDASE ANTIBODY: THYROID PEROXIDASE ANTIBODY: 8 [IU]/mL (ref 0–34)

## 2018-05-28 ENCOUNTER — Ambulatory Visit: Payer: Self-pay | Admitting: Allergy & Immunology

## 2018-05-31 ENCOUNTER — Other Ambulatory Visit: Payer: 59

## 2018-05-31 DIAGNOSIS — R197 Diarrhea, unspecified: Secondary | ICD-10-CM

## 2018-05-31 DIAGNOSIS — R1032 Left lower quadrant pain: Secondary | ICD-10-CM

## 2018-05-31 DIAGNOSIS — R109 Unspecified abdominal pain: Secondary | ICD-10-CM

## 2018-05-31 DIAGNOSIS — R1031 Right lower quadrant pain: Secondary | ICD-10-CM

## 2018-05-31 DIAGNOSIS — K7581 Nonalcoholic steatohepatitis (NASH): Secondary | ICD-10-CM

## 2018-05-31 DIAGNOSIS — G8929 Other chronic pain: Secondary | ICD-10-CM

## 2018-06-01 NOTE — Progress Notes (Signed)
Linda Becker... I saw this pt last week  And scheduled Colon with Pyrtle [facility]- unfortunately went on by her BMI which is over 50 ..swelling of the ankles, needs to be scheduled at hospital with Pyrtle , my apologies.  Also waiting for stool studies on her

## 2018-06-02 NOTE — Progress Notes (Addendum)
Linda Becker ... can this pt be added on during your January hospital week - or should we wait until  February  Addendum: Reviewed and agree with assessment and management plan. Would use my next outpatient [facility] date in early Feb Pyrtle, Linda Lines, MD

## 2018-06-04 LAB — GASTROINTESTINAL PATHOGEN PANEL PCR
C. difficile Tox A/B, PCR: UNDETERMINED — AB
Campylobacter, PCR: UNDETERMINED — AB
Cryptosporidium, PCR: UNDETERMINED — AB
E coli (ETEC) LT/ST PCR: UNDETERMINED — AB
E coli (STEC) stx1/stx2, PCR: UNDETERMINED — AB
E coli 0157, PCR: UNDETERMINED — AB
Giardia lamblia, PCR: UNDETERMINED — AB
Norovirus, PCR: UNDETERMINED — AB
Rotavirus A, PCR: UNDETERMINED — AB
Salmonella, PCR: UNDETERMINED — AB
Shigella, PCR: UNDETERMINED — AB

## 2018-06-04 LAB — FECAL LACTOFERRIN, QUANT
Fecal Lactoferrin: NEGATIVE
MICRO NUMBER: 91502189
SPECIMEN QUALITY:: ADEQUATE

## 2018-06-07 ENCOUNTER — Telehealth: Payer: 59 | Admitting: Family Medicine

## 2018-06-07 DIAGNOSIS — L259 Unspecified contact dermatitis, unspecified cause: Secondary | ICD-10-CM | POA: Diagnosis not present

## 2018-06-07 MED ORDER — TRIAMCINOLONE ACETONIDE 0.1 % EX CREA: 1.0000 "application " | TOPICAL_CREAM | Freq: Two times a day (BID) | CUTANEOUS | 0 refills | Status: DC

## 2018-06-07 MED ORDER — PREDNISONE 10 MG PO TABS: 10.0000 mg | ORAL_TABLET | Freq: Every day | ORAL | 0 refills | Status: AC

## 2018-06-07 NOTE — Progress Notes (Signed)
E Visit for Rash  We are sorry that you are not feeling well. Here is how we plan to help!  Based on what you shared with me it looks like you have contact dermatitis.  Contact dermatitis is a skin rash caused by something that touches the skin and causes irritation or inflammation.  Your skin may be red, swollen, dry, cracked, and itch.  The rash should go away in a few days but can last a few weeks.  If you get a rash, it's important to figure out what caused it so the irritant can be avoided in the future. and I have prescribed Prednisone 10 mg daily for 5 days  As well as a topical steroid cream- you should see improvement in less than 48 hours, if not please seek face to face evaluation and treatment.  I would continue the antihistamines as needed for itching and discomfort-this may cause drowsiness so caution when working or driving.   HOME CARE:   Take cool showers and avoid direct sunlight.  Apply cool compress or wet dressings.  Take a bath in an oatmeal bath.  Sprinkle content of one Aveeno packet under running faucet with comfortably warm water.  Bathe for 15-20 minutes, 1-2 times daily.  Pat dry with a towel. Do not rub the rash.  Use hydrocortisone cream.  Take an antihistamine like Benadryl for widespread rashes that itch.  The adult dose of Benadryl is 25-50 mg by mouth 4 times daily.  Caution:  This type of medication may cause sleepiness.  Do not drink alcohol, drive, or operate dangerous machinery while taking antihistamines.  Do not take these medications if you have prostate enlargement.  Read package instructions thoroughly on all medications that you take.  GET HELP RIGHT AWAY IF:   Symptoms don't go away after treatment.  Severe itching that persists.  If you rash spreads or swells.  If you rash begins to smell.  If it blisters and opens or develops a yellow-brown crust.  You develop a fever.  You have a sore throat.  You become short of  breath.  MAKE SURE YOU:  Understand these instructions. Will watch your condition. Will get help right away if you are not doing well or get worse.  Thank you for choosing an e-visit. Your e-visit answers were reviewed by a board certified advanced clinical practitioner to complete your personal care plan. Depending upon the condition, your plan could have included both over the counter or prescription medications. Please review your pharmacy choice. Be sure that the pharmacy you have chosen is open so that you can pick up your prescription now.  If there is a problem you may message your provider in [place] to have the prescription routed to another pharmacy. Your safety is important to [place]. If you have drug allergies check your prescription carefully.  For the next 24 hours, you can use MyChart to ask questions about today's visit, request a non-urgent call back, or ask for a work or school excuse from your e-visit provider. You will get an email in the next two days asking about your experience. I hope that your e-visit has been valuable and will speed your recovery.

## 2018-06-11 MED FILL — lamoTRIgine 150 MG TABS: 150 | 90 days supply | Qty: 90 | Fill #1

## 2018-06-14 ENCOUNTER — Ambulatory Visit: Payer: 59 | Admitting: Endocrinology

## 2018-06-17 ENCOUNTER — Encounter: Payer: 59 | Admitting: Internal Medicine

## 2018-06-18 ENCOUNTER — Other Ambulatory Visit: Payer: Self-pay

## 2018-06-18 ENCOUNTER — Other Ambulatory Visit: Payer: Self-pay | Admitting: Allergy and Immunology

## 2018-06-18 ENCOUNTER — Ambulatory Visit: Payer: 59 | Admitting: Physician Assistant

## 2018-06-18 DIAGNOSIS — R197 Diarrhea, unspecified: Secondary | ICD-10-CM

## 2018-06-18 DIAGNOSIS — R1031 Right lower quadrant pain: Secondary | ICD-10-CM

## 2018-06-18 DIAGNOSIS — G8929 Other chronic pain: Secondary | ICD-10-CM

## 2018-06-18 MED FILL — ONDANSETRON HCL 4 MG TABLET: 4 | 30 days supply | Qty: 30 | Fill #1

## 2018-06-18 MED FILL — QNASL 80 MCG/ACT AERS: 80 | 30 days supply | Qty: 11 | Fill #1

## 2018-06-18 MED FILL — ESOMEPRAZOLE MAG DR 40 MG C: 40 | 30 days supply | Qty: 60 | Fill #3

## 2018-06-18 MED FILL — LEVOTHYROXINE 88 MCG TABLET: 88 | 30 days supply | Qty: 30 | Fill #2

## 2018-06-18 MED FILL — LITHIUM CARBONATE ER 300 MG: 300 | 30 days supply | Qty: 90 | Fill #1

## 2018-06-18 MED FILL — PROAIR HFA 90 MCG INHALER: 108 (90 BAS | 25 days supply | Qty: 9 | Fill #0

## 2018-06-18 NOTE — Telephone Encounter (Signed)
Patient needs OV for additional refills.

## 2018-06-21 ENCOUNTER — Other Ambulatory Visit: Payer: Self-pay | Admitting: *Deleted

## 2018-06-21 MED ORDER — ALBUTEROL SULFATE 108 (90 BASE) MCG/ACT IN AEPB: 2.0000 | INHALATION_SPRAY | RESPIRATORY_TRACT | 1 refills | Status: DC | PRN

## 2018-06-23 MED FILL — MONTELUKAST SOD 10 MG TAB: 10 | 30 days supply | Qty: 30 | Fill #0

## 2018-06-29 DIAGNOSIS — F332 Major depressive disorder, recurrent severe without psychotic features: Secondary | ICD-10-CM | POA: Diagnosis not present

## 2018-06-29 DIAGNOSIS — F314 Bipolar disorder, current episode depressed, severe, without psychotic features: Secondary | ICD-10-CM | POA: Diagnosis not present

## 2018-07-01 ENCOUNTER — Ambulatory Visit: Payer: 59 | Admitting: Allergy and Immunology

## 2018-07-01 ENCOUNTER — Encounter: Payer: Self-pay | Admitting: Allergy and Immunology

## 2018-07-01 ENCOUNTER — Telehealth: Payer: Self-pay

## 2018-07-01 VITALS — BP 116/80 | HR 90 | Resp 20 | Ht 65.75 in | Wt 332.6 lb

## 2018-07-01 DIAGNOSIS — L308 Other specified dermatitis: Secondary | ICD-10-CM

## 2018-07-01 DIAGNOSIS — L989 Disorder of the skin and subcutaneous tissue, unspecified: Secondary | ICD-10-CM

## 2018-07-01 DIAGNOSIS — G43909 Migraine, unspecified, not intractable, without status migrainosus: Secondary | ICD-10-CM

## 2018-07-01 DIAGNOSIS — K219 Gastro-esophageal reflux disease without esophagitis: Secondary | ICD-10-CM

## 2018-07-01 DIAGNOSIS — J4550 Severe persistent asthma, uncomplicated: Secondary | ICD-10-CM

## 2018-07-01 DIAGNOSIS — J3089 Other allergic rhinitis: Secondary | ICD-10-CM | POA: Diagnosis not present

## 2018-07-01 MED ORDER — DUPILUMAB 300 MG/2ML ~~LOC~~ SOSY
300.0000 mg | PREFILLED_SYRINGE | Freq: Once | SUBCUTANEOUS | Status: AC
  Administered 2018-07-01: 300 mg via SUBCUTANEOUS

## 2018-07-01 NOTE — Telephone Encounter (Signed)
Looks like the referral is already in place and patient has been called 1x so far.   Thanks

## 2018-07-01 NOTE — Patient Instructions (Addendum)
    1. Every day use the following:   A. Symbicort 160 two inhalation two times per day with spacer  B. montelukast 73m one tablet one time per day  C. Nexium 40 mg one tablet twice a day  D. Cetirizine 168mone tablet two times per day  E. Qnasal - one puff each nostril one time per day    2. If needed:   A. ProAir HFA 2 inhalations or albuterol neb every 4-6 hours  B. OTC Systane multiple times per day  C. Shower followed by mometasone/triamcinolone 0.1% ointment daily  D. Pataday /PLillie Fragminne drop each eye one time per day  3. Referral to neurologist for migraine HA. (Visceral migraine?)  4. Start Dupilumab injections  5. Return in 12 weeks or earlier if problem

## 2018-07-01 NOTE — Progress Notes (Signed)
Patient started Dupixent 315m every 14 days for asthma.  Was given loading dose in clinic with one injection given by myself and instructed patient on how to self administer which she done with no issues. Patient will continue therapy from picking up drug when ready from [facility] outpt.

## 2018-07-01 NOTE — Progress Notes (Signed)
Follow-up Note  Referring Provider: Burnis Medin, MD Primary Provider: Burnis Medin, MD Date of Office Visit: 07/01/2018  Subjective:   Linda Becker (DOB: 02/01/1991) is a 28 y.o. female who returns to the [facility] on 07/01/2018 in re-evaluation of the following:  HPI: Linda Becker returns to this clinic in reevaluation of her asthma and allergic rhinoconjunctivitis and reflux and atopic dermatitis.  I last saw her in his clinic on 04 February 2018 at which point in time she had hepatitis and diarrhea and stillborn delivery at [redacted] weeks gestation.  Concerning her airway, she has rather significant problems with coughing and wheezing and must use her bronchodilator twice a day.  This occurs even though she consistently uses anti-inflammatory agents for her airway.    Overall she thinks that her nose is doing relatively well at this point.  Concerning her reflux.  She has the thinks that this is under pretty good control.  She does not have a lot of burping or belching or regurgitation and she has very little issues with her throat.  She continues to use Nexium twice a day.  She describes daily headaches.  She has a long history of headaches for which she is seen a neurologist about 3 years ago and was placed on Topamax which actually did work pretty well for her but unfortunately she developed some CNS side effects while utilizing this medication.  Presently she gets a headache and intense abdominal pain usually in conjunction with her headache.  She takes over-the-counter analgesics to treat this issue.  In addition, she has chronic abdominal pain and nausea.  She is under the care of a gastroenterologist regarding this issue and she is scheduled to see a hepatologist for her persistently elevated liver function test in association with a liver biopsy identifying fatty liver.  Recently she developed a rash on her left forearm that was relatively red and raised and  slightly itchy for which she went to the urgent care center and was treated with prednisone and and a triamcinolone ointment and she is doing better at this point.  Allergies as of 07/01/2018      Reactions   Progesterone Rash   Was in a form of birth control.   Penicillins Rash   Has patient had a PCN reaction causing immediate rash, facial/tongue/throat swelling, SOB or lightheadedness with hypotension: no Has patient had a PCN reaction causing severe rash involving mucus membranes or skin necrosis:  no Has patient had a PCN reaction that required hospitalization: no Has patient had a PCN reaction occurring within the last 10 years: yes If all of the above answers are "NO", then may proceed with Cephalosporin use.      Medication List      Albuterol Sulfate 108 (90 Base) MCG/ACT Aepb Commonly known as:  PROAIR RESPICLICK Inhale 2 puffs into the lungs every 4 (four) hours as needed.   Beclomethasone Dipropionate 80 MCG/ACT Aers Commonly known as:  QNASL Use one puff in each nostril once daily as directed.   budesonide-formoterol 160-4.5 MCG/ACT inhaler Commonly known as:  SYMBICORT Inhale 2 puffs into the lungs 2 (two) times daily.   cetirizine 10 MG tablet Commonly known as:  ZYRTEC Take 10 mg by mouth daily.   dicyclomine 20 MG tablet Commonly known as:  BENTYL Take 10 mg by mouth every 4-6 hours as needed.   esomeprazole 40 MG capsule Commonly known as:  NEXIUM Take 1 capsule (40 mg  total) by mouth 2 (two) times daily before a meal.   lamoTRIgine 100 MG tablet Commonly known as:  LAMICTAL Take 150 mg by mouth every morning.   levothyroxine 88 MCG tablet Commonly known as:  SYNTHROID, LEVOTHROID TAKE 1 TABLET BY MOUTH DAILY.   montelukast 10 MG tablet Commonly known as:  SINGULAIR TAKE 1 TABLET BY MOUTH DAILY.   olopatadine 0.1 % ophthalmic solution Commonly known as:  PATANOL Use one drop in each eye twice daily if needed   ondansetron 4 MG disintegrating  tablet Commonly known as:  ZOFRAN-ODT Take 4 mg by mouth every 8 (eight) hours as needed for nausea or vomiting.   PRENATAL FORMULA PO Take 1 tablet by mouth daily.   promethazine 25 MG tablet Commonly known as:  PHENERGAN Take 25 mg by mouth every 6 (six) hours as needed for nausea or vomiting.   vitamin E 400 UNIT capsule Take 400 Units by mouth 2 (two) times daily.       Past Medical History:  Diagnosis Date  . Allergic rhinitis    remote hx of allergy shots  . Asthma    since childhood  on controller meds extrinsic dr Carmelina Peal  . Gallbladder sludge   . GERD (gastroesophageal reflux disease)    on nexium  for  long term sx since childhood  . H/O miscarriage, not currently pregnant    [redacted] weeks  march 2016  . Hepatic steatosis   . Hypothyroidism   . Migraine   . Murmur    pt reports MVP  . Splenomegaly   . Steatohepatitis   . Syncope    under eval ? cause  . Tachycardia    episodes with near syncope eval dr Nadyne Coombes  on no meds dced LABA    Past Surgical History:  Procedure Laterality Date  . broken right femur  2008   rod placed  . DILATION AND CURETTAGE OF UTERUS    . OB ultrasound N/A 12/01/2017   see report  . TONSILLECTOMY  2006  . WISDOM TOOTH EXTRACTION      Review of systems negative except as noted in HPI / PMHx or noted below:  Review of Systems  Constitutional: Negative.   HENT: Negative.   Eyes: Negative.   Respiratory: Negative.   Cardiovascular: Negative.   Gastrointestinal: Negative.   Genitourinary: Negative.   Musculoskeletal: Negative.   Skin: Negative.   Neurological: Negative.   Endo/Heme/Allergies: Negative.   Psychiatric/Behavioral: Negative.      Objective:   Vitals:   07/01/18 1523  BP: 116/80  Pulse: 90  Resp: 20   Height: 5' 5.75" (167 cm)  Weight: (!) 332 lb 9.6 oz (150.9 kg)   Physical Exam Constitutional:      Appearance: She is not diaphoretic.  HENT:     Head: Normocephalic.     Right Ear: Tympanic  membrane, ear canal and external ear normal.     Left Ear: Tympanic membrane, ear canal and external ear normal.     Nose: Nose normal. No mucosal edema or rhinorrhea.     Mouth/Throat:     Pharynx: Uvula midline. No oropharyngeal exudate.  Eyes:     Conjunctiva/sclera: Conjunctivae normal.  Neck:     Thyroid: No thyromegaly.     Trachea: Trachea normal. No tracheal tenderness or tracheal deviation.  Cardiovascular:     Rate and Rhythm: Normal rate and regular rhythm.     Heart sounds: Normal heart sounds, S1 normal and S2 normal. No  murmur.  Pulmonary:     Effort: No respiratory distress.     Breath sounds: Normal breath sounds. No stridor. No wheezing or rales.  Lymphadenopathy:     Head:     Right side of head: No tonsillar adenopathy.     Left side of head: No tonsillar adenopathy.     Cervical: No cervical adenopathy.  Skin:    Findings: Rash (Slight erythema approximately 5 x 15 cm left anterior forearm without any induration.) present. No erythema.     Nails: There is no clubbing.   Neurological:     Mental Status: She is alert.     Diagnostics:    Spirometry was performed and demonstrated an FEV1 of 2.84 at 82 % of predicted.  The patient had an Asthma Control Test with the following results: ACT Total Score: 13.    Assessment and Plan:   1. Not well controlled severe persistent asthma   2. Perennial allergic rhinitis   3. Gastroesophageal reflux disease, esophagitis presence not specified   4. Inflammatory dermatosis   5. Migraine syndrome      1. Every day use the following:   A. Symbicort 160 two inhalation two times per day with spacer  B. montelukast 62m one tablet one time per day  C. Nexium 40 mg one tablet twice a day  D. Cetirizine 121mone tablet two times per day  E. Qnasal - one puff each nostril one time per day    2. If needed:   A. ProAir HFA 2 inhalations or albuterol neb every 4-6 hours  B. OTC Systane multiple times per day  C.  Shower followed by mometasone/triamcinolone 0.1% ointment daily  D. Pataday /PLillie Fragminne drop each eye one time per day  3. Referral to neurologist for migraine HA. (Visceral migraine?)  4. Start Dupilumab injections  5. Return in 12 weeks or earlier if problem  [place] not have good control of her airway disease and we will start her on dupilumab today and let her utilize a total of 6 injections and see her back in his clinic at that point in time to assess her response and consider further evaluation and treatment based upon this response.  She will remain on a large collection of anti-inflammatory agents for airway and also therapy directed against reflux.  I question whether she is having some visceral migraines given the fact that she gets intense abdominal pain right after she develops a headache.  She really needs to revisit with a neurologist and [facility] issue.  Maybe she would qualify for a monoclonal antibody directed against migraines.  Obviously she needs further evaluation regarding her hepatitis which she will have with her hepatology visit next week.  I will see her back in this clinic in 12 weeks or earlier if there is a problem.  ErAllena KatzMD [facility]

## 2018-07-01 NOTE — Telephone Encounter (Signed)
-----   Message from Donzetta Starch, [place] sent at 07/01/2018  4:34 PM EST ----- Regarding: [facility] Referral Please make referral to [facility], Dr. Elon Alas. Willis for consultation for migraines. Thank you!

## 2018-07-02 NOTE — Telephone Encounter (Signed)
Thank you :)

## 2018-07-05 ENCOUNTER — Encounter: Payer: Self-pay | Admitting: Allergy and Immunology

## 2018-07-05 ENCOUNTER — Other Ambulatory Visit: Payer: Self-pay | Admitting: *Deleted

## 2018-07-05 MED ORDER — DUPILUMAB 300 MG/2ML ~~LOC~~ SOSY: 300.0000 mg | PREFILLED_SYRINGE | SUBCUTANEOUS | 11 refills | Status: DC

## 2018-07-05 NOTE — Telephone Encounter (Signed)
Called patient and L/m advising her Dupixent approved. Will send RX, approval and copay card to [facility]. Advised patient to continue injections 330m one syring every 14 days

## 2018-07-06 ENCOUNTER — Telehealth: Payer: Self-pay | Admitting: Pharmacist

## 2018-07-06 NOTE — Telephone Encounter (Signed)
Called patient to schedule an appointment for the [facility]. I was unable to reach the patient so I left a HIPAA-compliant message requesting that the patient return my call.

## 2018-07-08 ENCOUNTER — Other Ambulatory Visit: Payer: Self-pay | Admitting: Allergy and Immunology

## 2018-07-08 ENCOUNTER — Ambulatory Visit: Payer: 59 | Admitting: Endocrinology

## 2018-07-08 DIAGNOSIS — F408 Other phobic anxiety disorders: Secondary | ICD-10-CM | POA: Diagnosis not present

## 2018-07-08 DIAGNOSIS — F3181 Bipolar II disorder: Secondary | ICD-10-CM | POA: Diagnosis not present

## 2018-07-08 MED FILL — SYMBICORT 160-4.5 MCG INH: 160-4.5 | 30 days supply | Qty: 10 | Fill #0

## 2018-07-09 DIAGNOSIS — K7581 Nonalcoholic steatohepatitis (NASH): Secondary | ICD-10-CM | POA: Diagnosis not present

## 2018-07-09 MED FILL — PROAIR RESPICLICK INHAL PWD: 108 (90 BAS | 17 days supply | Qty: 1 | Fill #0

## 2018-07-13 NOTE — Telephone Encounter (Signed)
Patient returned call, will schedule video visit. Instructions sent on how to schedule and conduct video visit.

## 2018-07-15 ENCOUNTER — Encounter: Payer: Self-pay | Admitting: Pharmacist

## 2018-07-15 ENCOUNTER — Ambulatory Visit (INDEPENDENT_AMBULATORY_CARE_PROVIDER_SITE_OTHER): Payer: 59 | Admitting: Pharmacist

## 2018-07-15 DIAGNOSIS — Z79899 Other long term (current) drug therapy: Secondary | ICD-10-CM

## 2018-07-15 DIAGNOSIS — R161 Splenomegaly, not elsewhere classified: Secondary | ICD-10-CM | POA: Diagnosis not present

## 2018-07-15 DIAGNOSIS — D696 Thrombocytopenia, unspecified: Secondary | ICD-10-CM | POA: Diagnosis not present

## 2018-07-15 MED ORDER — DUPILUMAB 300 MG/2ML ~~LOC~~ SOSY: 300.0000 mg | PREFILLED_SYRINGE | SUBCUTANEOUS | 11 refills | Status: DC

## 2018-07-15 MED FILL — DUPIXENT 300 MG/2 ML SAFE S: 300 | 28 days supply | Qty: 4 | Fill #0

## 2018-07-15 MED FILL — LITHIUM CARBONATE ER 300 MG: 300 | 30 days supply | Qty: 90 | Fill #0

## 2018-07-15 NOTE — Progress Notes (Signed)
   S: Patient presents to [facility] [place] for review of their specialty medication therapy.  Patient is currently taking Dupixent for asthma. Patient is managed by Dr. Neldon Mc for this.   Adherence: just started treatment. Next dose is due today.   Efficacy: has not noticed any difference yet but has only had one dose.  Dosing: 300 mg every 14 days  Dose adjustments: Renal: no dose adjustments (has not been studied) Hepatic: no dose adjustments (has not been studied)  Screening: TB test: patient reports this is negative  Monitoring: S/sx of infection: denies S/sx of hypersensitivity: denies S/sx of ocular effects: has noticed changes in vision and has an eye doctor appointment next week.  S/sx of eosinophilia/vasculitis: denies  O:     Lab Results  Component Value Date   WBC 6.9 03/15/2018   HGB 13.3 03/15/2018   HCT 43.7 03/15/2018   MCV 99.1 03/15/2018   PLT 135 (L) 03/15/2018      Chemistry      Component Value Date/Time   NA 139 02/04/2018 1457   NA 138 02/06/2017   K 4.0 02/04/2018 1457   CL 108 02/04/2018 1457   CO2 24 02/04/2018 1457   BUN 16 02/04/2018 1457   BUN 9 02/06/2017   CREATININE 0.94 02/04/2018 1457   GLU 95 02/06/2017      Component Value Date/Time   CALCIUM 9.4 02/04/2018 1457   ALKPHOS 155 (H) 04/01/2018 1504   AST 95 (H) 04/01/2018 1504   ALT 104 (H) 04/01/2018 1504   BILITOT 0.4 04/01/2018 1504       A/P: 1. Medication review: Patient currently on [place] for asthma. Reviewed the medication with the patient, including the following: Dupixent is a monoclonal antibody used for the treatment of asthma or atopic dermatitis. Patient educated on purpose, proper use and potential adverse effects of Dupixent. Possible adverse effects include increased risk of infection, ocular effects, vasculitis/eosinophilia, and hypersensitivity reactions. No recommendations for any changes unless vision changes are determined to be due to [place].  Patient to discuss vision changes with eye doctor next week as they may not be related to [place]. Patient to go pick up [place] today.    Christella Hartigan, PharmD, BCPS, BCACP, CPP Clinical Pharmacist Practitioner  [phone]

## 2018-07-20 DIAGNOSIS — F314 Bipolar disorder, current episode depressed, severe, without psychotic features: Secondary | ICD-10-CM | POA: Diagnosis not present

## 2018-07-20 DIAGNOSIS — F332 Major depressive disorder, recurrent severe without psychotic features: Secondary | ICD-10-CM | POA: Diagnosis not present

## 2018-07-22 DIAGNOSIS — H5203 Hypermetropia, bilateral: Secondary | ICD-10-CM | POA: Diagnosis not present

## 2018-07-22 DIAGNOSIS — H52223 Regular astigmatism, bilateral: Secondary | ICD-10-CM | POA: Diagnosis not present

## 2018-07-26 ENCOUNTER — Ambulatory Visit (HOSPITAL_COMMUNITY): Admit: 2018-07-26 | Payer: Medicaid Other | Admitting: Internal Medicine

## 2018-07-26 ENCOUNTER — Other Ambulatory Visit: Payer: Self-pay | Admitting: Allergy and Immunology

## 2018-07-26 ENCOUNTER — Other Ambulatory Visit: Payer: Self-pay

## 2018-07-26 ENCOUNTER — Encounter (HOSPITAL_COMMUNITY): Payer: Self-pay

## 2018-07-26 DIAGNOSIS — R1031 Right lower quadrant pain: Principal | ICD-10-CM

## 2018-07-26 DIAGNOSIS — G8929 Other chronic pain: Secondary | ICD-10-CM

## 2018-07-26 SURGERY — COLONOSCOPY WITH PROPOFOL
Anesthesia: Monitor Anesthesia Care

## 2018-07-26 MED FILL — DICYCLOMINE 20 MG TABLET: 20 | 30 days supply | Qty: 90 | Fill #2

## 2018-07-26 MED FILL — PROMETHAZINE 25 MG TABLET: 25 | 10 days supply | Qty: 60 | Fill #1

## 2018-07-26 MED FILL — MONTELUKAST SOD 10 MG TAB: 10 | 30 days supply | Qty: 30 | Fill #0

## 2018-07-26 MED FILL — LEVOTHYROXINE 88 MCG TABLET: 88 | 30 days supply | Qty: 30 | Fill #3

## 2018-07-26 MED FILL — ESOMEPRAZOLE MAG DR 40 MG C: 40 | 30 days supply | Qty: 60 | Fill #4

## 2018-07-26 NOTE — Telephone Encounter (Signed)
Email noted She does have a history of gallbladder sludge and so her fever and abdominal pain could be gallbladder related If this occurs again, I would recommend that she come immediately/as soon as possible to the office for a CBC, CMP and lipase level. If occurs with fever which does not quickly improve then she needs to be seen in urgent care or ER if this occurred out of office hours

## 2018-07-27 ENCOUNTER — Other Ambulatory Visit (INDEPENDENT_AMBULATORY_CARE_PROVIDER_SITE_OTHER): Payer: 59

## 2018-07-27 ENCOUNTER — Telehealth: Payer: Self-pay | Admitting: Internal Medicine

## 2018-07-27 DIAGNOSIS — G8929 Other chronic pain: Secondary | ICD-10-CM | POA: Diagnosis not present

## 2018-07-27 DIAGNOSIS — R1031 Right lower quadrant pain: Secondary | ICD-10-CM

## 2018-07-27 LAB — CBC WITH DIFFERENTIAL/PLATELET
Basophils Absolute: 0 10*3/uL (ref 0.0–0.1)
Basophils Relative: 0.4 % (ref 0.0–3.0)
Eosinophils Absolute: 0.2 10*3/uL (ref 0.0–0.7)
Eosinophils Relative: 2.4 % (ref 0.0–5.0)
HCT: 41.6 % (ref 36.0–46.0)
Hemoglobin: 13.6 g/dL (ref 12.0–15.0)
Lymphocytes Relative: 30.9 % (ref 12.0–46.0)
Lymphs Abs: 2.2 10*3/uL (ref 0.7–4.0)
MCHC: 32.8 g/dL (ref 30.0–36.0)
MCV: 93.9 fl (ref 78.0–100.0)
Monocytes Absolute: 0.4 10*3/uL (ref 0.1–1.0)
Monocytes Relative: 5.9 % (ref 3.0–12.0)
NEUTROS ABS: 4.3 10*3/uL (ref 1.4–7.7)
Neutrophils Relative %: 60.4 % (ref 43.0–77.0)
Platelets: 102 10*3/uL — ABNORMAL LOW (ref 150.0–400.0)
RBC: 4.43 Mil/uL (ref 3.87–5.11)
RDW: 13.8 % (ref 11.5–15.5)
WBC: 7 10*3/uL (ref 4.0–10.5)

## 2018-07-27 LAB — COMPREHENSIVE METABOLIC PANEL
ALT: 80 U/L — ABNORMAL HIGH (ref 0–35)
AST: 78 U/L — ABNORMAL HIGH (ref 0–37)
Albumin: 3.8 g/dL (ref 3.5–5.2)
Alkaline Phosphatase: 130 U/L — ABNORMAL HIGH (ref 39–117)
BUN: 10 mg/dL (ref 6–23)
CHLORIDE: 107 meq/L (ref 96–112)
CO2: 25 mEq/L (ref 19–32)
Calcium: 9 mg/dL (ref 8.4–10.5)
Creatinine, Ser: 0.8 mg/dL (ref 0.40–1.20)
GFR: 86.9 mL/min (ref >60.00)
Glucose, Bld: 132 mg/dL — ABNORMAL HIGH (ref 70–99)
Potassium: 4.1 mEq/L (ref 3.5–5.1)
SODIUM: 140 meq/L (ref 135–145)
Total Bilirubin: 0.5 mg/dL (ref 0.2–1.2)
Total Protein: 6.4 g/dL (ref 6.0–8.3)

## 2018-07-27 NOTE — Telephone Encounter (Signed)
Noted  

## 2018-07-27 NOTE — Telephone Encounter (Signed)
Contacted today by Roosevelt Locks with [facility]. [name] has been seen there and they agree with the diagnosis of NASH however she is now having ongoing issues with fever, splenomegaly and thrombocytopenia. There is concern that this is not related to portal hypertension though we both agree this should be excluded. By liver biopsy in September there was steatohepatitis and while fibrosis was not scored, there was no evidence of bridging fibrosis or cirrhosis.  It is the recommendation from liver clinic to proceed with upper endoscopy to rule out changes of portal hypertension in the upper GI tract.  We are also planning a colonoscopy for evaluation of her diarrhea and abdominal pain. Both of these can be done on the same day but would need to be in the outpatient hospital setting.  In the setting of recent fevers, thrombocytopenia, I would recommend that she be seen by hematology first before proceeding with endoscopies.  Dawn is in agreement and is making the hematology referral today.  Once we hear from hematology, we will need to proceed with endoscopy and colonoscopy with MAC in the outpatient hospital setting

## 2018-07-30 ENCOUNTER — Telehealth: Payer: Self-pay | Admitting: Hematology and Oncology

## 2018-07-30 ENCOUNTER — Encounter: Payer: Self-pay | Admitting: Hematology and Oncology

## 2018-07-30 NOTE — Telephone Encounter (Signed)
A new hem appt has been scheduled fort he pt to see Dr. Lindi Adie on 2/27 at 345pm. Pt has been cld and made aware of the appt date and time. Letter mailed.

## 2018-08-06 ENCOUNTER — Telehealth: Payer: Self-pay | Admitting: Allergy and Immunology

## 2018-08-06 ENCOUNTER — Telehealth: Payer: 59 | Admitting: Family

## 2018-08-06 DIAGNOSIS — R05 Cough: Secondary | ICD-10-CM

## 2018-08-06 DIAGNOSIS — R059 Cough, unspecified: Secondary | ICD-10-CM

## 2018-08-06 MED ORDER — BENZONATATE 100 MG PO CAPS: 100.0000 mg | ORAL_CAPSULE | Freq: Three times a day (TID) | ORAL | 1 refills | Status: DC | PRN

## 2018-08-06 MED ORDER — PREDNISONE 10 MG (21) PO TBPK: ORAL_TABLET | ORAL | 0 refills | Status: DC

## 2018-08-06 NOTE — Telephone Encounter (Signed)
Pt called and said that she needed something for her cough. She did a e-visit on line and they gave her predisone but nothing for her cough. [phone]. [facility] [address] [phone].

## 2018-08-06 NOTE — Telephone Encounter (Signed)
Script sent in for Tessalon pearls.   Salvatore Marvel, MD [facility] [place] [facility] [place]

## 2018-08-06 NOTE — Telephone Encounter (Signed)
Informed patient of script sent into pharmacy.

## 2018-08-06 NOTE — Telephone Encounter (Signed)
Please advise. This is Dr. Neldon Mc patient normally but he is out of the office today and you are the on call physician. Thank you.

## 2018-08-06 NOTE — Progress Notes (Signed)

## 2018-08-09 ENCOUNTER — Encounter: Payer: Self-pay | Admitting: Allergy and Immunology

## 2018-08-09 ENCOUNTER — Other Ambulatory Visit: Payer: Self-pay

## 2018-08-09 MED ORDER — ALBUTEROL SULFATE (2.5 MG/3ML) 0.083% IN NEBU: INHALATION_SOLUTION | RESPIRATORY_TRACT | 5 refills | Status: DC

## 2018-08-09 MED ORDER — ALBUTEROL SULFATE HFA 108 (90 BASE) MCG/ACT IN AERS: INHALATION_SPRAY | RESPIRATORY_TRACT | 1 refills | Status: DC

## 2018-08-09 MED FILL — PROAIR HFA 90 MCG INHALER: 108 (90 BAS | 25 days supply | Qty: 9 | Fill #0 | Status: TO

## 2018-08-09 MED FILL — ALBUTEROL 0.083% INHAL SOLN: (2.5 MG/3ML | 10 days supply | Qty: 150 | Fill #0

## 2018-08-11 ENCOUNTER — Ambulatory Visit (INDEPENDENT_AMBULATORY_CARE_PROVIDER_SITE_OTHER): Payer: Self-pay | Admitting: Nurse Practitioner

## 2018-08-11 DIAGNOSIS — Z0000 Encounter for general adult medical examination without abnormal findings: Secondary | ICD-10-CM

## 2018-08-11 NOTE — Progress Notes (Signed)
[place] NOTE  Patient Care Team: Panosh, Standley Brooking, MD as PCP - General (Internal Medicine) Neldon Mc, Donnamarie Poag, MD as Consulting Physician (Allergy and Immunology) Adrian Prows, MD as Consulting Physician (Cardiology) Elayne Snare, MD as Consulting Physician (Endocrinology)  CHIEF COMPLAINTS/PURPOSE OF CONSULTATION: Newly diagnosed thrombocytopenia and splenomegaly  HISTORY OF PRESENTING ILLNESS:  [name] 28 y.o. female is here because of recent diagnosis of thrombocytopenia and splenomegaly. She was referred by Dr. Zenovia Jarred. Her most recent labs from 07/27/18 showed WBC 7.0, Hg 13.6, platelets 102, ANC 4.3, ALT 80, AST 78. A CT abdomen pelvis on 02/25/18 showed mild-to-moderate splenomegaly and hepatic steatosis. A liver biopsy on 03/15/18 confirmed the steatohepatitis. She presents to the clinic alone today. She had a child on 12/22/2017 that was stillborn and had several complications during pregnancy. Following the birth she had elevated LFTs and an enlarged spleen. She denies a history of hepatitis.   I reviewed her records extensively and collaborated the history with the patient.  MEDICAL HISTORY:  Past Medical History:  Diagnosis Date  . Allergic rhinitis    remote hx of allergy shots  . Asthma    since childhood  on controller meds extrinsic dr Carmelina Peal  . Gallbladder sludge   . GERD (gastroesophageal reflux disease)    on nexium  for  long term sx since childhood  . H/O miscarriage, not currently pregnant    [redacted] weeks  march 2016  . Hepatic steatosis   . Hypothyroidism   . Migraine   . Murmur    pt reports MVP  . Splenomegaly   . Steatohepatitis   . Syncope    under eval ? cause  . Tachycardia    episodes with near syncope eval dr Nadyne Coombes  on no meds dced LABA    SURGICAL HISTORY: Past Surgical History:  Procedure Laterality Date  . broken right femur  2008   rod placed  . DILATION AND CURETTAGE OF UTERUS    . OB ultrasound N/A 12/01/2017     see report  . TONSILLECTOMY  2006  . WISDOM TOOTH EXTRACTION      SOCIAL HISTORY: Social History   Socioeconomic History  . Marital status: Divorced    Spouse name: Not on file  . Number of children: Not on file  . Years of education: Not on file  . Highest education level: Not on file  Occupational History  . Not on file  Social Needs  . Financial resource strain: Not on file  . Food insecurity:    Worry: Not on file    Inability: Not on file  . Transportation needs:    Medical: Not on file    Non-medical: Not on file  Tobacco Use  . Smoking status: Never Smoker  . Smokeless tobacco: Never Used  Substance and Sexual Activity  . Alcohol use: No    Alcohol/week: 0.0 standard drinks    Frequency: Never  . Drug use: No  . Sexual activity: Yes    Birth control/protection: None  Lifestyle  . Physical activity:    Days per week: Not on file    Minutes per session: Not on file  . Stress: Not on file  Relationships  . Social connections:    Talks on phone: Not on file    Gets together: Not on file    Attends religious service: Not on file    Active member of club or organization: Not on file    Attends  meetings of clubs or organizations: Not on file    Relationship status: Not on file  . Intimate partner violence:    Fear of current or ex partner: Not on file    Emotionally abused: Not on file    Physically abused: Not on file    Forced sexual activity: Not on file  Other Topics Concern  . 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 Asthma Father   . Arthritis Father   . Hyperlipidemia Father   . Heart disease Father   . Hypertension Father   . Diabetes Father   . Colon polyps Father   .  Arthritis Mother   . Hyperlipidemia Mother   . Heart disease Mother   . Hypertension Mother   . Diabetes Mother   . Kidney disease Mother   . Liver disease Mother   . Bleeding Disorder Mother   . Colon polyps Mother   . Arthritis Maternal Grandmother   . Breast cancer Maternal Grandmother   . Thyroid disease Maternal Grandmother   . Colon cancer Paternal Grandfather   . Diabetes Paternal Grandfather   . Liver cancer Paternal Grandfather   . Stomach cancer Paternal Grandfather   . Breast cancer Paternal Grandmother   . Diabetes Paternal Grandmother   . Breast cancer Paternal Aunt   . Esophageal cancer Paternal Aunt   . Breast cancer Paternal Aunt   . Diabetes Maternal Grandfather   . Pancreatic cancer Maternal Grandfather     ALLERGIES:  is allergic to progesterone and penicillins.  MEDICATIONS:  Current Outpatient Medications  Medication Sig Dispense Refill  . albuterol (PROAIR HFA) 108 (90 Base) MCG/ACT inhaler Inhale 2 puffs every 4-6 hours as needed for cough, wheeze, shortness of breath, or chest tightness 18 g 1  . albuterol (PROVENTIL) (2.5 MG/3ML) 0.083% nebulizer solution Use 1 vial nebulized every 4-6 hours as needed for cough, wheeze, shortness of breath or chest tightness 180 mL 5  . Beclomethasone Dipropionate (QNASL) 80 MCG/ACT AERS Use one puff in each nostril once daily as directed. 10.6 g 3  . benzonatate (TESSALON PERLES) 100 MG capsule Take 1 capsule (100 mg total) by mouth 3 (three) times daily as needed for up to 30 doses for cough. 20 capsule 1  . cetirizine (ZYRTEC) 10 MG tablet Take 10 mg by mouth daily.    Marland Kitchen dicyclomine (BENTYL) 20 MG tablet Take 10 mg by mouth every 4-6 hours as needed. 120 tablet 3  . Dupilumab (DUPIXENT) 300 MG/2ML SOSY Inject 300 mg into the skin every 14 (fourteen) days. 4 mL 11  . esomeprazole (NEXIUM) 40 MG capsule Take 1 capsule (40 mg total) by mouth 2 (two) times daily before a meal. 60 capsule 6  . lamoTRIgine (LAMICTAL) 100 MG  tablet Take 150 mg by mouth every morning.    Marland Kitchen levothyroxine (SYNTHROID, LEVOTHROID) 88 MCG tablet TAKE 1 TABLET BY MOUTH DAILY. 30 tablet 3  . montelukast (SINGULAIR) 10 MG tablet TAKE 1 TABLET BY MOUTH DAILY. 30 tablet 5  . olopatadine (PATANOL) 0.1 % ophthalmic solution Use one drop in each eye twice daily if needed  5 mL 5  . ondansetron (ZOFRAN-ODT) 4 MG disintegrating tablet Take 4 mg by mouth every 8 (eight) hours as needed for nausea or vomiting.    . predniSONE (STERAPRED UNI-PAK 21 TAB) 10 MG (21) TBPK tablet As directed (Patient not taking: Reported on 08/11/2018) 21 tablet 0  . Prenatal Vit-Fe Fumarate-FA (PRENATAL FORMULA PO) Take 1 tablet by mouth daily.    . promethazine (PHENERGAN) 25 MG tablet Take 25 mg by mouth every 6 (six) hours as needed for nausea or vomiting.    . SYMBICORT 160-4.5 MCG/ACT inhaler INHALE 2 PUFFS BY MOUTH TWICE A DAY TO PREVENT COUGH OR WHEEZE. RINSE, GARGLE, AND SPIT AFTER USE. 10.2 g 5  . vitamin E 400 UNIT capsule Take 400 Units by mouth 2 (two) times daily.     No current facility-administered medications for this visit.     REVIEW OF SYSTEMS:   Constitutional: Denies fevers, chills or abnormal night sweats Eyes: Denies blurriness of vision, double vision or watery eyes Ears, nose, mouth, throat, and face: Denies mucositis or sore throat Respiratory: Denies cough, dyspnea or wheezes Cardiovascular: Denies palpitation, chest discomfort or lower extremity swelling Gastrointestinal:  Denies nausea, heartburn or change in bowel habits Skin: Denies abnormal skin rashes Lymphatics: Denies new lymphadenopathy or easy bruising Neurological:Denies numbness, tingling or new weaknesses Behavioral/Psych: Mood is stable, no new changes  Breast: Denies any palpable lumps or discharge All other systems were reviewed with the patient and are negative.  PHYSICAL EXAMINATION: ECOG PERFORMANCE STATUS: 1 - Symptomatic but completely ambulatory  Vitals:    08/12/18 1551  BP: (!) 146/75  Pulse: 94  Resp: 20  Temp: 99.1 F (37.3 C)  SpO2: 100%   Filed Weights   08/12/18 1551  Weight: (!) 335 lb 3.2 oz (152 kg)    GENERAL:alert, no distress and comfortable SKIN: skin color, texture, turgor are normal, no rashes or significant lesions EYES: normal, conjunctiva are pink and non-injected, sclera clear OROPHARYNX:no exudate, no erythema and lips, buccal mucosa, and tongue normal  NECK: supple, thyroid normal size, non-tender, without nodularity LYMPH:  no palpable lymphadenopathy in the cervical, axillary or inguinal LUNGS: clear to auscultation and percussion with normal breathing effort HEART: regular rate & rhythm and no murmurs and no lower extremity edema ABDOMEN:abdomen soft, non-tender and normal bowel sounds Musculoskeletal:no cyanosis of digits and no clubbing  PSYCH: alert & oriented x 3 with fluent speech NEURO: no focal motor/sensory deficits  LABORATORY DATA:  I have reviewed the data as listed Lab Results  Component Value Date   WBC 7.0 07/27/2018   HGB 13.6 07/27/2018   HCT 41.6 07/27/2018   MCV 93.9 07/27/2018   PLT 102.0 (L) 07/27/2018   Lab Results  Component Value Date   NA 140 07/27/2018   K 4.1 07/27/2018   CL 107 07/27/2018   CO2 25 07/27/2018    RADIOGRAPHIC STUDIES: I have personally reviewed the radiological reports and agreed with the findings in the report.  ASSESSMENT AND PLAN:  Thrombocytopenia ([place]) 07/27/2018: Platelet count 102 02/04/2018: Platelet count 135 and prior to that 184  Remainder of the CBC with differential was normal  Differential diagnosis: 1. Low-grade ITP 2. medication induced: On further review patient is not taking any medications that are associated with thrombocytopenia 3. Bone marrow factors 4. Hepatitis B/C: Previously tested by her hepatologist were negative 5. Splenomegaly: Patient has fatty liver with moderately enlarged spleen.  This could be the potential cause  of her thrombocytopenia.  I  discussed with the patient that the level of thrombocytopenia is very mild and that these levels, there are usually no adverse effects. There is usually no risk of bleeding and hence it can be observed   Plan:  1. Obtain CBC to review the smear and also to look at immature platelet fraction.  If the immature platelet fraction is high it would indicate consumption/destruction. 2.  Strongly suggested to the patient that exercise and weight loss will be critical to improving the fatty liver and to decrease the spleen size.  Ultimately this is what will improve the platelet count.  We will call her with results of today's blood work. Return to clinic in 3 months with labs and follow-up.  Addendum: Today's platelet count 109, immature platelet fraction 7 This indicates that the most likely etiology is enlarged spleen. I called the patient to inform her of the test results.   All questions were answered. The patient knows to call the clinic with any problems, questions or concerns.   Nicholas Lose, MD 08/12/2018   Julious Oka Dorshimer, am acting as scribe for Nicholas Lose, MD.  I have reviewed the above documentation for accuracy and completeness, and I agree with the above.

## 2018-08-12 ENCOUNTER — Inpatient Hospital Stay: Payer: 59 | Attending: Hematology and Oncology | Admitting: Hematology and Oncology

## 2018-08-12 ENCOUNTER — Inpatient Hospital Stay: Payer: 59

## 2018-08-12 ENCOUNTER — Telehealth: Payer: Self-pay | Admitting: Hematology and Oncology

## 2018-08-12 DIAGNOSIS — R161 Splenomegaly, not elsewhere classified: Secondary | ICD-10-CM | POA: Diagnosis not present

## 2018-08-12 DIAGNOSIS — D696 Thrombocytopenia, unspecified: Secondary | ICD-10-CM | POA: Diagnosis not present

## 2018-08-12 DIAGNOSIS — F314 Bipolar disorder, current episode depressed, severe, without psychotic features: Secondary | ICD-10-CM | POA: Diagnosis not present

## 2018-08-12 DIAGNOSIS — Z79899 Other long term (current) drug therapy: Secondary | ICD-10-CM | POA: Diagnosis not present

## 2018-08-12 DIAGNOSIS — E039 Hypothyroidism, unspecified: Secondary | ICD-10-CM | POA: Diagnosis not present

## 2018-08-12 DIAGNOSIS — F332 Major depressive disorder, recurrent severe without psychotic features: Secondary | ICD-10-CM | POA: Diagnosis not present

## 2018-08-12 LAB — CBC WITH DIFFERENTIAL (CANCER CENTER ONLY)
Abs Immature Granulocytes: 0.04 10*3/uL (ref 0.00–0.07)
Basophils Absolute: 0 10*3/uL (ref 0.0–0.1)
Basophils Relative: 0 %
Eosinophils Absolute: 0.3 10*3/uL (ref 0.0–0.5)
Eosinophils Relative: 2 %
HCT: 44.7 % (ref 36.0–46.0)
Hemoglobin: 13.7 g/dL (ref 12.0–15.0)
Immature Granulocytes: 0 %
LYMPHS PCT: 26 %
Lymphs Abs: 3.2 10*3/uL (ref 0.7–4.0)
MCH: 29.7 pg (ref 26.0–34.0)
MCHC: 30.6 g/dL (ref 30.0–36.0)
MCV: 97 fL (ref 80.0–100.0)
Monocytes Absolute: 0.6 10*3/uL (ref 0.1–1.0)
Monocytes Relative: 5 %
Neutro Abs: 8.1 10*3/uL — ABNORMAL HIGH (ref 1.7–7.7)
Neutrophils Relative %: 67 %
Platelet Count: 109 10*3/uL — ABNORMAL LOW (ref 150–400)
RBC: 4.61 MIL/uL (ref 3.87–5.11)
RDW: 13.3 % (ref 11.5–15.5)
WBC Count: 12.3 10*3/uL — ABNORMAL HIGH (ref 4.0–10.5)
nRBC: 0 % (ref 0.0–0.2)

## 2018-08-12 LAB — IMMATURE PLATELET FRACTION: Immature Platelet Fraction: 7 % (ref 1.2–8.6)

## 2018-08-12 MED FILL — DUPIXENT 300 MG/2 ML SAFE S: 300 | 28 days supply | Qty: 4 | Fill #1 | Status: TO

## 2018-08-12 NOTE — Telephone Encounter (Signed)
Today's platelet count is 109 Immature platelet fraction of 7 This will be canceled the most likely etiology is enlarged spleen. We will monitor her with a recheck in 3 months.

## 2018-08-12 NOTE — Assessment & Plan Note (Addendum)
07/27/2018: Platelet count 102 02/04/2018: Platelet count 135 and prior to that 184  Remainder of the CBC with differential was normal  Differential diagnosis: 1. Low-grade ITP 2. medication induced: On further review patient is not taking any medications that are associated with thrombocytopenia 3. Bone marrow factors 4. Hepatitis B/C: Previously tested by her hepatologist were negative 5. Splenomegaly: Patient has fatty liver with moderately enlarged spleen.  This could be the potential cause of her thrombocytopenia.  I discussed with the patient that the level of thrombocytopenia is very mild and that these levels, there are usually no adverse effects. There is usually no risk of bleeding and hence it can be observed   Plan:  1. Obtain CBC to review the smear and also to look at immature platelet fraction.  If the immature platelet fraction is high it would indicate consumption/destruction. 2.  Strongly suggested to the patient that exercise and weight loss will be critical to improving the fatty liver and to decrease the spleen size.  Ultimately this is what will improve the platelet count.  We will call her with results of today's blood work. Return to clinic in 3 months with labs and follow-up.

## 2018-08-13 ENCOUNTER — Encounter: Payer: Self-pay | Admitting: *Deleted

## 2018-08-13 ENCOUNTER — Other Ambulatory Visit: Payer: Self-pay | Admitting: Internal Medicine

## 2018-08-13 DIAGNOSIS — R197 Diarrhea, unspecified: Secondary | ICD-10-CM

## 2018-08-13 DIAGNOSIS — R109 Unspecified abdominal pain: Secondary | ICD-10-CM

## 2018-08-13 MED ORDER — SUPREP BOWEL PREP KIT 17.5-3.13-1.6 GM/177ML PO SOLN: 1.0000 | ORAL | 0 refills | Status: DC

## 2018-08-13 MED FILL — SUPREP BOWEL PREP KIT: 17.5-3.13-1 | 1 days supply | Qty: 354 | Fill #0

## 2018-08-13 NOTE — Telephone Encounter (Signed)
I have spoken to patient who is agreeable to 08/24/2018 endoscopy/colonoscopy at [facility] at 945 am. Patient has been scheduled. She is to arrive at 815 am for registration. I have advised her of this and have given her new instructions via mychart message since she has already received instructions for suprep recently.

## 2018-08-13 NOTE — Telephone Encounter (Signed)
Dr [name], please see oncology office note dated 08/14/2018. Is she now okay to undergo hospital endo/colon as previously recommended by you (see your recommendations below)? If so, do you want to try to perform procedures hospital week or next available Hospital Tuesday (probably May as April time slots are all taken)?

## 2018-08-13 NOTE — Telephone Encounter (Signed)
Linda Becker I reviewed the oncology note Please see if patient could come for upper endoscopy and colonoscopy on 08/24/2018 (in the slot recently vacated by cancellation)

## 2018-08-13 NOTE — Telephone Encounter (Signed)
DNS See telephone notes

## 2018-08-16 ENCOUNTER — Encounter: Payer: Self-pay | Admitting: Allergy and Immunology

## 2018-08-16 ENCOUNTER — Telehealth: Payer: Self-pay | Admitting: Internal Medicine

## 2018-08-16 NOTE — Telephone Encounter (Signed)
Patient informed that FMLA papers were interofficed to [employer] on Friday afternoon. She verbalizes understanding.

## 2018-08-16 NOTE — Telephone Encounter (Signed)
Pt called to follow up with FMLA papers, she stated that she called on Friday and was told that papers where on Dr. Vena Rua desk for him to sign them. Pls call pt.

## 2018-08-17 NOTE — Progress Notes (Signed)
Erroneous encounter

## 2018-08-19 DIAGNOSIS — F3181 Bipolar II disorder: Secondary | ICD-10-CM | POA: Diagnosis not present

## 2018-08-19 DIAGNOSIS — F408 Other phobic anxiety disorders: Secondary | ICD-10-CM | POA: Diagnosis not present

## 2018-08-19 MED FILL — QNASL 80 MCG/ACT AERS: 80 | 30 days supply | Qty: 11 | Fill #2 | Status: TO

## 2018-08-19 MED FILL — SYMBICORT 160-4.5 MCG INH: 160-4.5 | 30 days supply | Qty: 10 | Fill #1 | Status: TO

## 2018-08-20 ENCOUNTER — Telehealth: Payer: Self-pay | Admitting: Internal Medicine

## 2018-08-20 MED FILL — LITHIUM CARBONATE ER 300 MG: 300 | 30 days supply | Qty: 90 | Fill #0

## 2018-08-20 MED FILL — ONDANSETRON HCL 4 MG TABLET: 4 | 30 days supply | Qty: 30 | Fill #0 | Status: TO

## 2018-08-20 MED FILL — GABAPENTIN 100 MG CAPSULE: 100 | 30 days supply | Qty: 90 | Fill #0 | Status: TO

## 2018-08-20 MED FILL — PROMETHAZINE 25 MG TABLET: 25 | 10 days supply | Qty: 60 | Fill #0 | Status: TO

## 2018-08-20 MED FILL — LORazepam 0.5 MG TABS: 0.5 | 30 days supply | Qty: 15 | Fill #0 | Status: TO

## 2018-08-20 NOTE — Telephone Encounter (Signed)
Appt cancelled, let pt know that we will contact her when the next hospital date comes out.

## 2018-08-20 NOTE — Telephone Encounter (Signed)
Pt is scheduled for a procedure at [facility] 3.10.20.  Pt requested to reschedule due to midterms.

## 2018-08-24 ENCOUNTER — Ambulatory Visit (HOSPITAL_COMMUNITY): Admit: 2018-08-24 | Payer: Medicaid Other | Admitting: Internal Medicine

## 2018-08-24 DIAGNOSIS — F314 Bipolar disorder, current episode depressed, severe, without psychotic features: Secondary | ICD-10-CM | POA: Diagnosis not present

## 2018-08-24 DIAGNOSIS — F332 Major depressive disorder, recurrent severe without psychotic features: Secondary | ICD-10-CM | POA: Diagnosis not present

## 2018-08-24 SURGERY — ESOPHAGOGASTRODUODENOSCOPY (EGD) WITH PROPOFOL
Anesthesia: Monitor Anesthesia Care

## 2018-08-25 ENCOUNTER — Telehealth: Payer: Self-pay | Admitting: Neurology

## 2018-08-25 ENCOUNTER — Other Ambulatory Visit: Payer: Self-pay | Admitting: Endocrinology

## 2018-08-25 ENCOUNTER — Telehealth: Payer: Self-pay | Admitting: *Deleted

## 2018-08-25 ENCOUNTER — Ambulatory Visit: Payer: 59 | Admitting: Neurology

## 2018-08-25 MED FILL — ESOMEPRAZOLE MAG DR 40 MG C: 40 | 30 days supply | Qty: 60 | Fill #5 | Status: TO

## 2018-08-25 MED FILL — LEVOTHYROXINE 88 MCG TABLET: 88 | 30 days supply | Qty: 30 | Fill #0 | Status: TO

## 2018-08-25 MED FILL — MONTELUKAST SOD 10 MG TAB: 10 | 30 days supply | Qty: 30 | Fill #1 | Status: TO

## 2018-08-25 NOTE — Progress Notes (Deleted)
[facility]    Provider:  Dr Jaynee Eagles Requesting Provider: Allena Katz, MD Primary Care Provider:  Burnis Medin, MD  CC:  Migraines  HPI:  Linda Becker is a 28 y.o. female here as requested by Panosh, Standley Brooking, MD for migraines. PMHx syncope, migraine with aura, asthma, hypothyroidism, gestational diabetes, tachycardia, obesity, snoring, thrombocytopenia, murmur.  Reviewed notes, labs and imaging from outside physicians, which showed ***  Last CBC August 12, 2018 showed elevated white blood cells, platelet count 109, CMP showed elevated glucose at 132 as well as elevated liver enzymes and alk phos.  T4 free in December 2019 was normal.  Reviewed notes from Dr. Neldon Mc.  She describes daily headaches.  She has a long history of headaches she saw a neurologist 3 years ago and was placed on Topamax which actually did work well but unfortunately she developed some CNS side effects while on the medication.  Presently she gets a headache and intense abdominal pain usually in conjunction with her headache.  She takes over-the-counter analgesics to treat this issue.  She also has chronic abdominal pain and nausea.  She is under the care of a gastroenterologist regarding this issue and she is seeing a hepatologist for persistently elevated liver function testing associated with liver biopsy identifying fatty liver.  Review of Systems: Patient complains of symptoms per HPI as well as the following symptoms ***. Pertinent negatives and positives per HPI. All others negative.   Social History   Socioeconomic History  . Marital status: Divorced    Spouse name: Not on file  . Number of children: Not on file  . Years of education: Not on file  . Highest education level: Not on file  Occupational History  . Not on file  Social Needs  . Financial resource strain: Not on file  . Food insecurity:    Worry: Not on file    Inability: Not on file  . Transportation needs:   Medical: Not on file    Non-medical: Not on file  Tobacco Use  . Smoking status: Never Smoker  . Smokeless tobacco: Never Used  Substance and Sexual Activity  . Alcohol use: No    Alcohol/week: 0.0 standard drinks    Frequency: Never  . Drug use: No  . Sexual activity: Yes    Birth control/protection: None  Lifestyle  . Physical activity:    Days per week: Not on file    Minutes per session: Not on file  . Stress: Not on file  Relationships  . Social connections:    Talks on phone: Not on file    Gets together: Not on file    Attends religious service: Not on file    Active member of club or organization: Not on file    Attends meetings of clubs or organizations: Not on file    Relationship status: Not on file  . Intimate partner violence:    Fear of current or ex partner: Not on file    Emotionally abused: Not on file    Physically abused: Not on file    Forced sexual activity: Not on file  Other Topics Concern  . 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 Asthma Father   . Arthritis Father   . Hyperlipidemia Father   . Heart disease Father   . Hypertension Father   . Diabetes Father   . Colon polyps Father   . Arthritis Mother   . Hyperlipidemia Mother   . Heart disease Mother   . Hypertension Mother   . Diabetes Mother   . Kidney disease Mother   . Liver disease Mother   . Bleeding Disorder Mother   . Colon polyps Mother   . Arthritis Maternal Grandmother   . Breast cancer Maternal Grandmother   . Thyroid disease Maternal Grandmother   . Colon cancer Paternal Grandfather   . Diabetes Paternal Grandfather   . Liver cancer Paternal Grandfather   . Stomach cancer Paternal Grandfather   . Breast cancer Paternal  Grandmother   . Diabetes Paternal Grandmother   . Breast cancer Paternal Aunt   . Esophageal cancer Paternal Aunt   . Breast cancer Paternal Aunt   . Diabetes Maternal Grandfather   . Pancreatic cancer Maternal Grandfather     Past Medical History:  Diagnosis Date  . Allergic rhinitis    remote hx of allergy shots  . Asthma    since childhood  on controller meds extrinsic dr Carmelina Peal  . Gallbladder sludge   . GERD (gastroesophageal reflux disease)    on nexium  for  long term sx since childhood  . H/O miscarriage, not currently pregnant    [redacted] weeks  march 2016  . Hepatic steatosis   . Hypothyroidism   . Migraine   . Murmur    pt reports MVP  . Splenomegaly   . Steatohepatitis   . Syncope    under eval ? cause  . Tachycardia    episodes with near syncope eval dr Nadyne Coombes  on no meds dced LABA    Patient Active Problem List   Diagnosis Date Noted  . Thrombocytopenia ([place]) 08/12/2018  . Spontaneous vaginal delivery 12/22/2017  . IUFD at 45 weeks or more of gestation 12/20/2017  . Snoring 11/10/2017  . Gestational diabetes 10/12/2017  . Morbid obesity ([place]) 05/19/2017  . Adult hypothyroidism 05/19/2017  . BMI 40.0-44.9, adult ([place]) 11/09/2015  . GERD (gastroesophageal reflux disease)   . Tachycardia   . Syncope   . Allergic rhinitis   . Persistent asthma with undetermined severity   . Migraine with aura 09/26/2011    Past Surgical History:  Procedure Laterality Date  . broken right femur  2008   rod placed  . DILATION AND CURETTAGE OF UTERUS    . OB ultrasound N/A 12/01/2017   see report  . TONSILLECTOMY  2006  . WISDOM TOOTH EXTRACTION      Current Outpatient Medications  Medication Sig Dispense Refill  . albuterol (PROAIR HFA) 108 (90 Base) MCG/ACT inhaler Inhale 2 puffs every 4-6 hours as needed for cough, wheeze, shortness of breath, or chest tightness 18 g 1  . albuterol (PROVENTIL) (2.5 MG/3ML) 0.083% nebulizer solution Use 1 vial nebulized every 4-6 hours  as needed for cough, wheeze, shortness of breath or chest tightness 180 mL 5  . Beclomethasone Dipropionate (QNASL) 80 MCG/ACT AERS Use one puff in each nostril once daily as directed. 10.6 g 3  . benzonatate (TESSALON PERLES) 100 MG capsule Take 1 capsule (100 mg total) by mouth 3 (three) times daily as needed for up to 30 doses for cough. 20 capsule  1  . cetirizine (ZYRTEC) 10 MG tablet Take 10 mg by mouth daily.    Marland Kitchen dicyclomine (BENTYL) 20 MG tablet Take 10 mg by mouth every 4-6 hours as needed. 120 tablet 3  . Dupilumab (DUPIXENT) 300 MG/2ML SOSY Inject 300 mg into the skin every 14 (fourteen) days. 4 mL 11  . esomeprazole (NEXIUM) 40 MG capsule Take 1 capsule (40 mg total) by mouth 2 (two) times daily before a meal. 60 capsule 6  . lamoTRIgine (LAMICTAL) 100 MG tablet Take 150 mg by mouth every morning.    Marland Kitchen levothyroxine (SYNTHROID, LEVOTHROID) 88 MCG tablet TAKE 1 TABLET BY MOUTH DAILY. 30 tablet 3  . montelukast (SINGULAIR) 10 MG tablet TAKE 1 TABLET BY MOUTH DAILY. 30 tablet 5  . olopatadine (PATANOL) 0.1 % ophthalmic solution Use one drop in each eye twice daily if needed 5 mL 5  . ondansetron (ZOFRAN-ODT) 4 MG disintegrating tablet Take 4 mg by mouth every 8 (eight) hours as needed for nausea or vomiting.    . predniSONE (STERAPRED UNI-PAK 21 TAB) 10 MG (21) TBPK tablet As directed (Patient not taking: Reported on 08/11/2018) 21 tablet 0  . Prenatal Vit-Fe Fumarate-FA (PRENATAL FORMULA PO) Take 1 tablet by mouth daily.    . promethazine (PHENERGAN) 25 MG tablet Take 25 mg by mouth every 6 (six) hours as needed for nausea or vomiting.    Marland Kitchen SUPREP BOWEL PREP KIT 17.5-3.13-1.6 GM/177ML SOLN Take 1 kit by mouth as directed. For colonoscopy prep 2 Bottle 0  . SYMBICORT 160-4.5 MCG/ACT inhaler INHALE 2 PUFFS BY MOUTH TWICE A DAY TO PREVENT COUGH OR WHEEZE. RINSE, GARGLE, AND SPIT AFTER USE. 10.2 g 5  . vitamin E 400 UNIT capsule Take 400 Units by mouth 2 (two) times daily.     No current  facility-administered medications for this visit.     Allergies as of 08/25/2018 - Review Complete 08/11/2018  Allergen Reaction Noted  . Progesterone Rash 05/19/2017  . Penicillins Rash 02/10/2018    Vitals: There were no vitals taken for this visit. Last Weight:  Wt Readings from Last 1 Encounters:  08/12/18 (!) 335 lb 3.2 oz (152 kg)   Last Height:   Ht Readings from Last 1 Encounters:  08/12/18 5' 5.75" (1.67 m)     Physical exam: Exam: Gen: NAD, conversant, well nourised, obese, well groomed                     CV: RRR, no MRG. No Carotid Bruits. No peripheral edema, warm, nontender Eyes: Conjunctivae clear without exudates or hemorrhage  Neuro: Detailed Neurologic Exam  Speech:    Speech is normal; fluent and spontaneous with normal comprehension.  Cognition:    The patient is oriented to person, place, and time;     recent and remote memory intact;     language fluent;     normal attention, concentration,     fund of knowledge Cranial Nerves:    The pupils are equal, round, and reactive to light. The fundi are normal and spontaneous venous pulsations are present. Visual fields are full to finger confrontation. Extraocular movements are intact. Trigeminal sensation is intact and the muscles of mastication are normal. The face is symmetric. The palate elevates in the midline. Hearing intact. Voice is normal. Shoulder shrug is normal. The tongue has normal motion without fasciculations.   Coordination:    Normal finger to nose and heel to shin. Normal rapid alternating movements.   Gait:  Heel-toe and tandem gait are normal.   Motor Observation:    No asymmetry, no atrophy, and no involuntary movements noted. Tone:    Normal muscle tone.    Posture:    Posture is normal. normal erect    Strength:    Strength is V/V in the upper and lower limbs.      Sensation: intact to LT     Reflex Exam:  DTR's:    Deep tendon reflexes in the upper and lower  extremities are normal bilaterally.   Toes:    The toes are downgoing bilaterally.   Clonus:    Clonus is absent.    Assessment/Plan:    No orders of the defined types were placed in this encounter.  No orders of the defined types were placed in this encounter.   Cc: Panosh, Standley Brooking, MD,  Panosh, Standley Brooking, MD  Sarina Ill, MD  [facility] [address] [place], [place] [address]  Phone [phone] Fax [phone]

## 2018-08-25 NOTE — Telephone Encounter (Signed)
Pt no showed new pt appt today. 

## 2018-08-25 NOTE — Telephone Encounter (Signed)
Pt no showed to new pt apt today

## 2018-08-26 ENCOUNTER — Encounter: Payer: Self-pay | Admitting: Neurology

## 2018-08-26 ENCOUNTER — Other Ambulatory Visit: Payer: Self-pay

## 2018-08-26 ENCOUNTER — Telehealth: Payer: Self-pay

## 2018-08-26 ENCOUNTER — Ambulatory Visit: Payer: 59 | Admitting: Family Medicine

## 2018-08-26 DIAGNOSIS — Z79899 Other long term (current) drug therapy: Secondary | ICD-10-CM | POA: Diagnosis not present

## 2018-08-26 DIAGNOSIS — E559 Vitamin D deficiency, unspecified: Secondary | ICD-10-CM | POA: Diagnosis not present

## 2018-08-26 DIAGNOSIS — R109 Unspecified abdominal pain: Secondary | ICD-10-CM

## 2018-08-26 DIAGNOSIS — F3181 Bipolar II disorder: Secondary | ICD-10-CM | POA: Diagnosis not present

## 2018-08-26 DIAGNOSIS — R1013 Epigastric pain: Secondary | ICD-10-CM

## 2018-08-26 NOTE — Telephone Encounter (Signed)
Pts ECL rescheduled at [facility] 09/03/18@1pm . Pt aware of appt.

## 2018-08-27 ENCOUNTER — Encounter: Payer: Self-pay | Admitting: Allergy and Immunology

## 2018-08-31 ENCOUNTER — Telehealth: Payer: 59 | Admitting: Family

## 2018-08-31 ENCOUNTER — Encounter (HOSPITAL_COMMUNITY): Payer: Self-pay | Admitting: *Deleted

## 2018-08-31 DIAGNOSIS — N390 Urinary tract infection, site not specified: Secondary | ICD-10-CM | POA: Diagnosis not present

## 2018-08-31 DIAGNOSIS — A499 Bacterial infection, unspecified: Secondary | ICD-10-CM | POA: Diagnosis not present

## 2018-08-31 MED ORDER — NITROFURANTOIN MONOHYD MACRO 100 MG PO CAPS: 100.0000 mg | ORAL_CAPSULE | Freq: Two times a day (BID) | ORAL | 0 refills | Status: DC

## 2018-08-31 NOTE — Progress Notes (Signed)

## 2018-09-02 ENCOUNTER — Telehealth: Payer: Self-pay | Admitting: *Deleted

## 2018-09-02 NOTE — Telephone Encounter (Signed)
Per Dr Vena Rua verbal order, this case should remain ON for tomorrow. Case has already been reviewed with Daryel Gerald, RN at [facility].

## 2018-09-02 NOTE — Telephone Encounter (Signed)
Patient is currently scheduled for endoscopy/colonoscopy on 09/03/18 (tomorrow) at [facility] due to GERD, abdominal pain, cramping and diarrhea. Hospital would like all non-essential procedures placed on hold at this time due to COVID-19. Please review case and let me know whether this case should be placed on hold or should remain on schedule as an essential procedure. I will be in touch with hospital to advise.Marland KitchenMarland KitchenMarland Kitchen

## 2018-09-03 ENCOUNTER — Other Ambulatory Visit: Payer: Self-pay

## 2018-09-03 ENCOUNTER — Ambulatory Visit (HOSPITAL_COMMUNITY): Payer: 59 | Admitting: Certified Registered Nurse Anesthetist

## 2018-09-03 ENCOUNTER — Encounter (HOSPITAL_COMMUNITY)
Admission: RE | Disposition: A | Discharge status: Home / Self Care | Payer: Self-pay | Source: Home / Self Care | Attending: Internal Medicine

## 2018-09-03 ENCOUNTER — Ambulatory Visit (HOSPITAL_COMMUNITY)
Admission: RE | Admit: 2018-09-03 | Discharge: 2018-09-03 | Disposition: A | Discharge status: Home / Self Care | Payer: 59 | Attending: Internal Medicine | Admitting: Internal Medicine

## 2018-09-03 ENCOUNTER — Encounter (HOSPITAL_COMMUNITY): Payer: Self-pay | Admitting: Internal Medicine

## 2018-09-03 DIAGNOSIS — K3189 Other diseases of stomach and duodenum: Secondary | ICD-10-CM | POA: Diagnosis not present

## 2018-09-03 DIAGNOSIS — R197 Diarrhea, unspecified: Secondary | ICD-10-CM

## 2018-09-03 DIAGNOSIS — D122 Benign neoplasm of ascending colon: Secondary | ICD-10-CM

## 2018-09-03 DIAGNOSIS — K7581 Nonalcoholic steatohepatitis (NASH): Secondary | ICD-10-CM | POA: Insufficient documentation

## 2018-09-03 DIAGNOSIS — K2970 Gastritis, unspecified, without bleeding: Secondary | ICD-10-CM | POA: Insufficient documentation

## 2018-09-03 DIAGNOSIS — K635 Polyp of colon: Secondary | ICD-10-CM | POA: Insufficient documentation

## 2018-09-03 DIAGNOSIS — I8500 Esophageal varices without bleeding: Secondary | ICD-10-CM | POA: Insufficient documentation

## 2018-09-03 DIAGNOSIS — Z79899 Other long term (current) drug therapy: Secondary | ICD-10-CM | POA: Insufficient documentation

## 2018-09-03 DIAGNOSIS — K621 Rectal polyp: Secondary | ICD-10-CM | POA: Diagnosis not present

## 2018-09-03 DIAGNOSIS — K449 Diaphragmatic hernia without obstruction or gangrene: Secondary | ICD-10-CM | POA: Insufficient documentation

## 2018-09-03 DIAGNOSIS — R1030 Lower abdominal pain, unspecified: Secondary | ICD-10-CM | POA: Insufficient documentation

## 2018-09-03 DIAGNOSIS — K529 Noninfective gastroenteritis and colitis, unspecified: Secondary | ICD-10-CM

## 2018-09-03 DIAGNOSIS — D123 Benign neoplasm of transverse colon: Secondary | ICD-10-CM | POA: Diagnosis not present

## 2018-09-03 DIAGNOSIS — J45909 Unspecified asthma, uncomplicated: Secondary | ICD-10-CM | POA: Insufficient documentation

## 2018-09-03 DIAGNOSIS — K766 Portal hypertension: Secondary | ICD-10-CM

## 2018-09-03 DIAGNOSIS — K219 Gastro-esophageal reflux disease without esophagitis: Secondary | ICD-10-CM | POA: Insufficient documentation

## 2018-09-03 DIAGNOSIS — R1013 Epigastric pain: Secondary | ICD-10-CM

## 2018-09-03 DIAGNOSIS — I8510 Secondary esophageal varices without bleeding: Secondary | ICD-10-CM | POA: Diagnosis not present

## 2018-09-03 DIAGNOSIS — D128 Benign neoplasm of rectum: Secondary | ICD-10-CM

## 2018-09-03 DIAGNOSIS — R109 Unspecified abdominal pain: Secondary | ICD-10-CM

## 2018-09-03 HISTORY — PX: BIOPSY: SHX5522

## 2018-09-03 HISTORY — PX: POLYPECTOMY: SHX5525

## 2018-09-03 HISTORY — PX: COLONOSCOPY WITH PROPOFOL: SHX5780

## 2018-09-03 HISTORY — PX: ESOPHAGOGASTRODUODENOSCOPY (EGD) WITH PROPOFOL: SHX5813

## 2018-09-03 LAB — PREGNANCY, URINE: Preg Test, Ur: NEGATIVE

## 2018-09-03 SURGERY — COLONOSCOPY WITH PROPOFOL
Anesthesia: Monitor Anesthesia Care

## 2018-09-03 MED ORDER — SODIUM CHLORIDE 0.9 % IV SOLN: INTRAVENOUS | Status: DC

## 2018-09-03 MED ORDER — MIDAZOLAM HCL 2 MG/2ML IJ SOLN
INTRAMUSCULAR | Status: AC
  Filled 2018-09-03: qty 2

## 2018-09-03 MED ORDER — PROPOFOL 10 MG/ML IV BOLUS
INTRAVENOUS | Status: AC
  Filled 2018-09-03: qty 20

## 2018-09-03 MED ORDER — PROPOFOL 500 MG/50ML IV EMUL
INTRAVENOUS | Status: DC | PRN
  Administered 2018-09-03: 30 mg via INTRAVENOUS

## 2018-09-03 MED ORDER — PROPOFOL 10 MG/ML IV BOLUS
INTRAVENOUS | Status: AC
  Filled 2018-09-03: qty 40

## 2018-09-03 MED ORDER — LACTATED RINGERS IV SOLN
INTRAVENOUS | Status: AC | PRN
  Administered 2018-09-03: 1000 mL via INTRAVENOUS

## 2018-09-03 MED ORDER — PROPOFOL 500 MG/50ML IV EMUL
INTRAVENOUS | Status: DC | PRN
  Administered 2018-09-03: 175 ug/kg/min via INTRAVENOUS

## 2018-09-03 MED ORDER — MIDAZOLAM HCL 5 MG/5ML IJ SOLN
INTRAMUSCULAR | Status: DC | PRN
  Administered 2018-09-03: 2 mg via INTRAVENOUS

## 2018-09-03 SURGICAL SUPPLY — 24 items

## 2018-09-03 NOTE — Op Note (Addendum)
[facility] Patient Name: Linda Becker Procedure Date: 09/03/2018 MRN: [id number] Attending MD: Jerene Bears , MD Date of Birth: 1991/01/27 CSN: [id number] Age: 28 Admit Type: Outpatient Procedure:                Upper GI endoscopy Indications:              Portal hypertension rule out esophageal varices,                            chronic diarrhea with abdominal pain Providers:                [name]. [name], MD, Carlyn Reichert, RN, Charolette Child,                            Technician, Herbie Drape, CRNA Referring MD:             Roosevelt Locks, NP                           Shanon Ace, MD Medicines:                Monitored Anesthesia Care Complications:            No immediate complications. Estimated Blood Loss:     Estimated blood loss was minimal. Procedure:                Pre-Anesthesia Assessment:                           - Prior to the procedure, a History and Physical                            was performed, and patient medications and                            allergies were reviewed. The patient's tolerance of                            previous anesthesia was also reviewed. The risks                            and benefits of the procedure and the sedation                            options and risks were discussed with the patient.                            All questions were answered, and informed consent                            was obtained. Prior Anticoagulants: The patient has                            taken no previous anticoagulant or antiplatelet  agents. ASA Grade Assessment: III - A patient with                            severe systemic disease. After reviewing the risks                            and benefits, the patient was deemed in                            satisfactory condition to undergo the procedure.                           After obtaining informed consent, the endoscope was     passed under direct vision. Throughout the                            procedure, the patient's blood pressure, pulse, and                            oxygen saturations were monitored continuously. The                            [id number]-H190 ([id number]) Olympus gastroscope was                            introduced through the mouth, and advanced to the                            second part of duodenum. The upper GI endoscopy was                            accomplished without difficulty. The patient                            tolerated the procedure well. Scope In: Scope Out: Findings:      Grade I, small (< 5 mm) varices were found in the lower third of the       esophagus.      A small hiatal hernia was present.      Moderate inflammation characterized by erythema and nodularity was found       in the gastric antrum. Biopsies were taken with a cold forceps for       histology and Helicobacter pylori testing.      The cardia and gastric fundus were normal on retroflexion.      The examined duodenum was normal. Biopsies for histology were taken with       a cold forceps for evaluation of celiac disease. Impression:               - Grade I and small (< 5 mm) esophageal varices.                           - Small hiatal hernia.                           - Gastritis. Biopsied.                           -  Normal examined duodenum. Biopsied. Moderate Sedation:      N/A Recommendation:           - Patient has a contact number available for                            emergencies. The signs and symptoms of potential                            delayed complications were discussed with the                            patient. Return to normal activities tomorrow.                            Written discharge instructions were provided to the                            patient.                           - Resume previous diet.                           - Continue present medications.                            - Await pathology results.                           - Return to liver clinic as previously scheduled.                            As this visit discussion regarding addition of                            non-selective beta blocker and further evaluation                            for etiology of portal hypertension (ECHO versus                            repeat liver biopsy with measurement of portal                            venous pressure gradient). Procedure Code(s):        --- Professional ---                           [phone], Esophagogastroduodenoscopy, flexible,                            transoral; with biopsy, single or multiple Diagnosis Code(s):        --- Professional ---                           K76.6, Portal hypertension  I85.10, Secondary esophageal varices without                            bleeding                           K44.9, Diaphragmatic hernia without obstruction or                            gangrene                           K29.70, Gastritis, unspecified, without bleeding CPT copyright 2018 American Medical Association. All rights reserved. The codes documented in this report are preliminary and upon coder review may  be revised to meet current compliance requirements. Jerene Bears, MD 09/03/2018 2:22:23 PM This report has been signed electronically. Number of Addenda: 0

## 2018-09-03 NOTE — Anesthesia Postprocedure Evaluation (Signed)
Anesthesia Post Note  Patient: [name]  Procedure(s) Performed: COLONOSCOPY WITH PROPOFOL (N/A ) ESOPHAGOGASTRODUODENOSCOPY (EGD) WITH PROPOFOL (N/A ) POLYPECTOMY BIOPSY     Patient location during evaluation: PACU Anesthesia Type: MAC Level of consciousness: awake and alert Pain management: pain level controlled Vital Signs Assessment: post-procedure vital signs reviewed and stable Respiratory status: spontaneous breathing, nonlabored ventilation and respiratory function stable Cardiovascular status: blood pressure returned to baseline and stable Postop Assessment: no apparent nausea or vomiting Anesthetic complications: no    Last Vitals:  Vitals:   09/03/18 1420 09/03/18 1430  BP: (!) 145/60 (!) 126/55  Pulse: 93 92  Resp: 19 (!) 25  Temp:  36.5 C  SpO2: 100% 100%    Last Pain:  Vitals:   09/03/18 1430  TempSrc: Oral  PainSc: 0-No pain                 Brennan Bailey

## 2018-09-03 NOTE — Discharge Instructions (Signed)

## 2018-09-03 NOTE — Op Note (Signed)
[facility] Patient Name: Linda Becker Procedure Date: 09/03/2018 MRN: [id number] Attending MD: Jerene Bears , MD Date of Birth: 03-Dec-1990 CSN: [id number] Age: 28 Admit Type: Outpatient Procedure:                Colonoscopy Indications:              Generalized abdominal pain, Chronic diarrhea Providers:                [name]. [name], MD, Carlyn Reichert, RN, Charolette Child,                            Technician, Herbie Drape, CRNA Referring MD:             Roosevelt Locks, NP                           Standley Brooking. Panosh Medicines:                Monitored Anesthesia Care Complications:            No immediate complications. Estimated Blood Loss:     Estimated blood loss was minimal. Procedure:                Pre-Anesthesia Assessment:                           - Prior to the procedure, a History and Physical                            was performed, and patient medications and                            allergies were reviewed. The patient's tolerance of                            previous anesthesia was also reviewed. The risks                            and benefits of the procedure and the sedation                            options and risks were discussed with the patient.                            All questions were answered, and informed consent                            was obtained. Prior Anticoagulants: The patient has                            taken no previous anticoagulant or antiplatelet                            agents. ASA Grade Assessment: III - A patient with  severe systemic disease. After reviewing the risks                            and benefits, the patient was deemed in                            satisfactory condition to undergo the procedure.                           After obtaining informed consent, the colonoscope                            was passed under direct vision. Throughout the   procedure, the patient's blood pressure, pulse, and                            oxygen saturations were monitored continuously. The                            [id number]-HQ190L ([id number]) Olympus colonoscope was                            introduced through the anus and advanced to the                            cecum, identified by appendiceal orifice and                            ileocecal valve. The colonoscopy was performed                            without difficulty. The patient tolerated the                            procedure well. The quality of the bowel                            preparation was good. The ileocecal valve,                            appendiceal orifice, and rectum were photographed. Scope In: 1:48:08 PM Scope Out: 2:05:32 PM Scope Withdrawal Time: 0 hours 12 minutes 55 seconds  Total Procedure Duration: 0 hours 17 minutes 24 seconds  Findings:      The digital rectal exam was normal.      Two sessile polyps were found in the ascending colon. The polyps were 2       to 3 mm in size. These polyps were removed with a cold biopsy forceps.       Resection and retrieval were complete.      Two sessile polyps were found in the transverse colon. The polyps were 3       to 4 mm in size. These polyps were removed with a cold snare. Resection       was complete, but the polyp tissue was only partially retrieved.      A 5 mm polyp  was found in the rectum. The polyp was sessile. The polyp       was removed with a cold snare. Resection and retrieval were complete.      The exam was otherwise without abnormality on direct and retroflexion       views.      Biopsies for histology were taken with a cold forceps from the right       colon and left colon for evaluation of microscopic colitis. Impression:               - Two 2 to 3 mm polyps in the ascending colon,                            removed with a cold biopsy forceps. Resected and                            retrieved.                            - Two 3 to 4 mm polyps in the transverse colon,                            removed with a cold snare. Complete resection.                            Partial retrieval.                           - One 5 mm polyp in the rectum, removed with a cold                            snare. Resected and retrieved.                           - The examination was otherwise normal on direct                            and retroflexion views.                           - Biopsies were taken with a cold forceps from the                            right colon and left colon for evaluation of                            microscopic colitis. Moderate Sedation:      N/A Recommendation:           - Patient has a contact number available for                            emergencies. The signs and symptoms of potential                            delayed complications were discussed with the  patient. Return to normal activities tomorrow.                            Written discharge instructions were provided to the                            patient.                           - Resume previous diet.                           - Continue present medications.                           - Await pathology results.                           - Repeat colonoscopy is recommended. The                            colonoscopy date will be determined after pathology                            results from today's exam become available for                            review. Procedure Code(s):        --- Professional ---                           [phone], Colonoscopy, flexible; with removal of                            tumor(s), polyp(s), or other lesion(s) by snare                            technique                           [id number], 26, Colonoscopy, flexible; with biopsy,                            single or multiple Diagnosis Code(s):        --- Professional ---                            D12.2, Benign neoplasm of ascending colon                           D12.3, Benign neoplasm of transverse colon (hepatic                            flexure or splenic flexure)                           K62.1, Rectal polyp  R10.84, Generalized abdominal pain                           K52.9, Noninfective gastroenteritis and colitis,                            unspecified CPT copyright 2018 American Medical Association. All rights reserved. The codes documented in this report are preliminary and upon coder review may  be revised to meet current compliance requirements. Jerene Bears, MD 09/03/2018 2:25:25 PM This report has been signed electronically. Number of Addenda: 0

## 2018-09-03 NOTE — H&P (Signed)
HPI: Linda Becker is a 28 year old female with a 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  She is here today with her mother.  Linda Becker has had ongoing issues with diffuse abdominal pain triggered by eating and chronic diarrhea.  Nonbloody non-melenic stools.  Some nausea without vomiting.  Past Medical History:  Diagnosis Date  . Allergic rhinitis    remote hx of allergy shots  . Asthma    since childhood  on controller meds extrinsic dr Carmelina Peal  . Gallbladder sludge   . GERD (gastroesophageal reflux disease)    on nexium  for  long term sx since childhood  . H/O miscarriage, not currently pregnant    [redacted] weeks  march 2016  . Hepatic steatosis   . Hypothyroidism   . Migraine   . Murmur    pt reports MVP  . Splenomegaly   . Steatohepatitis   . Syncope    under eval ? cause  . Tachycardia    episodes with near syncope eval dr Nadyne Coombes  on no meds dced LABA    Past Surgical History:  Procedure Laterality Date  . broken right femur  2008   rod placed  . DILATION AND CURETTAGE OF UTERUS    . OB ultrasound N/A 12/01/2017   see report  . TONSILLECTOMY  2006  . WISDOM TOOTH EXTRACTION      (Not in an outpatient encounter)   Allergies  Allergen Reactions  . Progesterone Rash    Was in a form of birth control.  . Penicillins Rash    Did it involve swelling of the face/tongue/throat, SOB, or low BP? No Did it involve sudden or severe rash/hives, skin peeling, or any reaction on the inside of your mouth or nose? Yes Did you need to seek medical attention at a hospital or doctor's office? No When did it last happen?9 + months If all above answers are "NO", may proceed with cephalosporin use.      Family History  Problem Relation Age of  Onset  . Asthma Father   . Arthritis Father   . Hyperlipidemia Father   . Heart disease Father   . Hypertension Father   . Diabetes Father   . Colon polyps Father   . Arthritis Mother   . Hyperlipidemia Mother   . Heart disease Mother   . Hypertension Mother   . Diabetes Mother   . Kidney disease Mother   . Liver disease Mother   . Bleeding Disorder Mother   . Colon polyps Mother   . Arthritis Maternal Grandmother   . Breast cancer Maternal Grandmother   . Thyroid disease Maternal Grandmother   . Colon cancer Paternal Grandfather   . Diabetes Paternal Grandfather   . Liver cancer Paternal Grandfather   . Stomach cancer Paternal Grandfather   . Breast cancer Paternal Grandmother   . Diabetes Paternal Grandmother   . Breast cancer Paternal Aunt   . Esophageal cancer Paternal Aunt   . Breast cancer Paternal Aunt   . Diabetes Maternal Grandfather   . Pancreatic cancer Maternal Grandfather     Social History   Tobacco Use  . Smoking status: Never Smoker  . Smokeless tobacco: Never Used  Substance Use Topics  . Alcohol use: No    Alcohol/week: 0.0 standard drinks    Frequency: Never  .  Drug use: No    ROS: As per history of present illness, otherwise negative  BP 136/67   Pulse 87   Temp 98.6 F (37 C) (Oral)   Resp 16   Ht 5' 5"  (1.651 m)   Wt (!) 147.4 kg   LMP 08/18/2018 (Exact Date)   SpO2 100%   BMI 54.08 kg/m  Gen: awake, alert, NAD HEENT: anicteric, op clear CV: RRR, no mrg Pulm: CTA b/l Abd: soft, NT/ND, +BS throughout Ext: no c/c/e Neuro: nonfocal   RELEVANT LABS AND IMAGING: CBC    Component Value Date/Time   WBC 12.3 (H) 08/12/2018 1616   WBC 7.0 07/27/2018 1340   RBC 4.61 08/12/2018 1616   HGB 13.7 08/12/2018 1616   HGB 14.3 12/31/2016 1129   HCT 44.7 08/12/2018 1616   HCT 45.0 12/31/2016 1129   PLT 109 (L) 08/12/2018 1616   PLT 256 12/31/2016 1129   MCV 97.0 08/12/2018 1616   MCV 98 (H) 12/31/2016 1129   MCH 29.7 08/12/2018  1616   MCHC 30.6 08/12/2018 1616   RDW 13.3 08/12/2018 1616   RDW 12.7 12/31/2016 1129   LYMPHSABS 3.2 08/12/2018 1616   LYMPHSABS 2.1 12/31/2016 1129   MONOABS 0.6 08/12/2018 1616   EOSABS 0.3 08/12/2018 1616   EOSABS 0.3 12/31/2016 1129   BASOSABS 0.0 08/12/2018 1616   BASOSABS 0.0 12/31/2016 1129    CMP     Component Value Date/Time   NA 140 07/27/2018 1340   NA 138 02/06/2017   K 4.1 07/27/2018 1340   CL 107 07/27/2018 1340   CO2 25 07/27/2018 1340   GLUCOSE 132 (H) 07/27/2018 1340   BUN 10 07/27/2018 1340   BUN 9 02/06/2017   CREATININE 0.80 07/27/2018 1340   CALCIUM 9.0 07/27/2018 1340   PROT 6.4 07/27/2018 1340   ALBUMIN 3.8 07/27/2018 1340   AST 78 (H) 07/27/2018 1340   ALT 80 (H) 07/27/2018 1340   ALKPHOS 130 (H) 07/27/2018 1340   BILITOT 0.5 07/27/2018 1340   GFRNONAA >60 12/20/2017 1035   GFRAA >60 12/20/2017 1035    ASSESSMENT/PLAN: 28 year old female with a 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   1.  NASH with signs of portal hypertension/splenomegaly with thrombocytopenia --upper endoscopy to rule out portal hypertensive changes including varices in the upper GI tract.  Also to evaluate abdominal pain and chronic diarrhea.    2.  Chronic diarrhea and lower abdominal pain --colonoscopy to evaluate the symptoms.  We discussed the risk, benefits and alternatives and she is agreeable and wishes to proceed

## 2018-09-03 NOTE — Transfer of Care (Signed)
Immediate Anesthesia Transfer of Care Note  Patient: [name]  Procedure(s) Performed: COLONOSCOPY WITH PROPOFOL (N/A ) ESOPHAGOGASTRODUODENOSCOPY (EGD) WITH PROPOFOL (N/A ) POLYPECTOMY BIOPSY  Patient Location: PACU  Anesthesia Type:MAC  Level of Consciousness: awake, alert  and oriented  Airway & Oxygen Therapy: Patient Spontanous Breathing and Patient connected to face mask oxygen  Post-op Assessment: Report given to RN and Post -op Vital signs reviewed and stable  Post vital signs: Reviewed and stable  Last Vitals:  Vitals Value Taken Time  BP    Temp    Pulse    Resp    SpO2      Last Pain:  Vitals:   09/03/18 1127  TempSrc:   PainSc: 0-No pain         Complications: No apparent anesthesia complications

## 2018-09-03 NOTE — Anesthesia Preprocedure Evaluation (Signed)
Anesthesia Evaluation  Patient identified by MRN, date of birth, ID band Patient awake    Reviewed: Allergy & Precautions, NPO status , Patient's Chart, lab work & pertinent test results  History of Anesthesia Complications Negative for: history of anesthetic complications  Airway Mallampati: II  TM Distance: >3 FB Neck ROM: Full    Dental  (+) Dental Advisory Given, Chipped,    Pulmonary asthma ,    Pulmonary exam normal breath sounds clear to auscultation       Cardiovascular negative cardio ROS Normal cardiovascular exam Rhythm:Regular Rate:Normal     Neuro/Psych  Headaches,    GI/Hepatic Neg liver ROS, GERD  Controlled and Medicated,  Endo/Other  diabetesHypothyroidism   Renal/GU negative Renal ROS     Musculoskeletal negative musculoskeletal ROS (+)   Abdominal   Peds  Hematology negative hematology ROS (+)   Anesthesia Other Findings Day of surgery medications reviewed with the patient.  Reproductive/Obstetrics                             Anesthesia Physical Anesthesia Plan  ASA: III  Anesthesia Plan: MAC   Post-op Pain Management:    Induction:   PONV Risk Score and Plan: Treatment may vary due to age or medical condition and Propofol infusion  Airway Management Planned: Natural Airway and Nasal Cannula  Additional Equipment:   Intra-op Plan:   Post-operative Plan:   Informed Consent: I have reviewed the patients History and Physical, chart, labs and discussed the procedure including the risks, benefits and alternatives for the proposed anesthesia with the patient or authorized representative who has indicated his/her understanding and acceptance.     Dental advisory given  Plan Discussed with: CRNA  Anesthesia Plan Comments:         Anesthesia Quick Evaluation

## 2018-09-07 MED FILL — ALBUTEROL SULFATE HFA 108 (: 108 (90 BAS | 16 days supply | Qty: 18 | Fill #0

## 2018-09-07 MED FILL — PROMETHAZINE 25 MG TABLET: 25 | 10 days supply | Qty: 60 | Fill #0

## 2018-09-07 MED FILL — ONDANSETRON HCL 4 MG TABLET: 4 | 30 days supply | Qty: 30 | Fill #0

## 2018-09-07 MED FILL — QNASL 80 MCG/ACT AERS: 80 | 30 days supply | Qty: 11 | Fill #0

## 2018-09-07 MED FILL — SUBVENITE 150 MG TABS: 150 | 30 days supply | Qty: 30 | Fill #0

## 2018-09-07 MED FILL — SYMBICORT 160-4.5 MCG INH: 160-4.5 | 30 days supply | Qty: 10 | Fill #0

## 2018-09-07 MED FILL — LITHIUM CARBONATE ER 300 MG: 300 | 30 days supply | Qty: 120 | Fill #0

## 2018-09-07 MED FILL — DUPIXENT 300 MG/2 ML SAFE S: 300 | 28 days supply | Qty: 4 | Fill #0

## 2018-09-07 MED FILL — GABAPENTIN 100 MG CAPSULE: 100 | 30 days supply | Qty: 90 | Fill #0

## 2018-09-08 ENCOUNTER — Other Ambulatory Visit: Payer: Self-pay

## 2018-09-08 MED ORDER — RIFAXIMIN 200 MG PO TABS: 200.0000 mg | ORAL_TABLET | Freq: Three times a day (TID) | ORAL | 0 refills | Status: DC

## 2018-09-15 ENCOUNTER — Telehealth: Payer: Self-pay | Admitting: *Deleted

## 2018-09-15 NOTE — Telephone Encounter (Signed)
MedImpact has approved patient's Xifaxan 550 mg from 09/15/18-12/08/18. [place] Reference [id number].

## 2018-09-16 DIAGNOSIS — F3181 Bipolar II disorder: Secondary | ICD-10-CM | POA: Diagnosis not present

## 2018-09-16 DIAGNOSIS — F314 Bipolar disorder, current episode depressed, severe, without psychotic features: Secondary | ICD-10-CM | POA: Diagnosis not present

## 2018-09-16 DIAGNOSIS — F408 Other phobic anxiety disorders: Secondary | ICD-10-CM | POA: Diagnosis not present

## 2018-09-16 DIAGNOSIS — F332 Major depressive disorder, recurrent severe without psychotic features: Secondary | ICD-10-CM | POA: Diagnosis not present

## 2018-09-16 MED FILL — LEVOTHYROXINE 88 MCG TABLET: 88 | 30 days supply | Qty: 30 | Fill #0

## 2018-09-16 MED FILL — LORazepam 0.5 MG TABS: 0.5 | 30 days supply | Qty: 15 | Fill #0

## 2018-09-16 MED FILL — ESOMEPRAZOLE MAG DR 40 MG C: 40 | 30 days supply | Qty: 60 | Fill #0

## 2018-09-16 MED FILL — MONTELUKAST SOD 10 MG TAB: 10 | 30 days supply | Qty: 30 | Fill #0

## 2018-09-21 DIAGNOSIS — I8500 Esophageal varices without bleeding: Secondary | ICD-10-CM | POA: Diagnosis not present

## 2018-09-21 DIAGNOSIS — K7581 Nonalcoholic steatohepatitis (NASH): Secondary | ICD-10-CM | POA: Diagnosis not present

## 2018-09-24 MED FILL — GABAPENTIN 300 MG CAPSULE: 300 | 30 days supply | Qty: 30 | Fill #0

## 2018-09-24 MED FILL — DICYCLOMINE 20 MG TABLET: 20 | 40 days supply | Qty: 120 | Fill #0

## 2018-09-24 MED FILL — DUPIXENT 300 MG/2 ML SAFE S: 300 | 28 days supply | Qty: 4 | Fill #1

## 2018-09-30 ENCOUNTER — Ambulatory Visit: Payer: 59 | Admitting: Allergy and Immunology

## 2018-09-30 MED ORDER — RIFAXIMIN 550 MG PO TABS: 550.0000 mg | ORAL_TABLET | Freq: Three times a day (TID) | ORAL | 0 refills | Status: DC

## 2018-09-30 MED FILL — XIFAXAN 550 MG TABLET: 550 | 14 days supply | Qty: 42 | Fill #0

## 2018-09-30 NOTE — Addendum Note (Signed)
Addended by: Larina Bras on: 09/30/2018 01:51 PM   Modules accepted: Orders

## 2018-10-04 ENCOUNTER — Ambulatory Visit: Payer: 59 | Admitting: Allergy and Immunology

## 2018-10-05 DIAGNOSIS — F314 Bipolar disorder, current episode depressed, severe, without psychotic features: Secondary | ICD-10-CM | POA: Diagnosis not present

## 2018-10-05 DIAGNOSIS — F332 Major depressive disorder, recurrent severe without psychotic features: Secondary | ICD-10-CM | POA: Diagnosis not present

## 2018-10-06 MED FILL — GABAPENTIN 100 MG CAPSULE: 100 | 30 days supply | Qty: 90 | Fill #0

## 2018-10-11 DIAGNOSIS — K760 Fatty (change of) liver, not elsewhere classified: Secondary | ICD-10-CM | POA: Diagnosis not present

## 2018-10-13 ENCOUNTER — Other Ambulatory Visit: Payer: Self-pay | Admitting: *Deleted

## 2018-10-14 ENCOUNTER — Encounter: Payer: Self-pay | Admitting: *Deleted

## 2018-10-14 DIAGNOSIS — F3170 Bipolar disorder, currently in remission, most recent episode unspecified: Secondary | ICD-10-CM | POA: Diagnosis not present

## 2018-10-14 DIAGNOSIS — F3181 Bipolar II disorder: Secondary | ICD-10-CM | POA: Diagnosis not present

## 2018-10-14 DIAGNOSIS — Z79899 Other long term (current) drug therapy: Secondary | ICD-10-CM | POA: Diagnosis not present

## 2018-10-14 DIAGNOSIS — F408 Other phobic anxiety disorders: Secondary | ICD-10-CM | POA: Diagnosis not present

## 2018-10-14 MED FILL — SUBVENITE 150 MG TABS: 150 | 30 days supply | Qty: 30 | Fill #0

## 2018-10-14 MED FILL — ONDANSETRON HCL 4 MG TABLET: 4 | 30 days supply | Qty: 30 | Fill #0

## 2018-10-15 MED FILL — LORazepam 0.5 MG TABS: 0.5 | 30 days supply | Qty: 15 | Fill #0

## 2018-10-15 MED FILL — LITHIUM CARBONATE ER 300 MG: 300 | 30 days supply | Qty: 120 | Fill #0

## 2018-10-16 MED FILL — MONTELUKAST SOD 10 MG TAB: 10 | 30 days supply | Qty: 30 | Fill #1

## 2018-10-16 MED FILL — LEVOTHYROXINE 88 MCG TABLET: 88 | 30 days supply | Qty: 30 | Fill #1

## 2018-10-16 MED FILL — GABAPENTIN 300 MG CAPSULE: 300 | 30 days supply | Qty: 30 | Fill #0

## 2018-10-16 MED FILL — DUPIXENT 300 MG/2 ML SAFE S: 300 | 28 days supply | Qty: 4 | Fill #2

## 2018-10-18 ENCOUNTER — Ambulatory Visit: Payer: 59 | Admitting: Internal Medicine

## 2018-10-18 ENCOUNTER — Other Ambulatory Visit: Payer: Self-pay

## 2018-10-18 ENCOUNTER — Other Ambulatory Visit: Payer: Self-pay | Admitting: Physician Assistant

## 2018-10-18 ENCOUNTER — Other Ambulatory Visit: Payer: Self-pay | Admitting: Internal Medicine

## 2018-10-18 MED FILL — ESOMEPRAZOLE MAG DR 40 MG C: 40 | 30 days supply | Qty: 60 | Fill #0

## 2018-10-21 ENCOUNTER — Ambulatory Visit: Payer: 59 | Admitting: Internal Medicine

## 2018-10-21 DIAGNOSIS — F314 Bipolar disorder, current episode depressed, severe, without psychotic features: Secondary | ICD-10-CM | POA: Diagnosis not present

## 2018-10-21 DIAGNOSIS — F332 Major depressive disorder, recurrent severe without psychotic features: Secondary | ICD-10-CM | POA: Diagnosis not present

## 2018-10-23 NOTE — Progress Notes (Signed)
Greater than 5 minutes, yet less than 10 minutes of time have been spent researching, coordinating, and implementing care for this patient today.  Thank you for the details you included in the comment boxes. Those details are very helpful in determining the best course of treatment for you and help us to provide the best care.  

## 2018-10-24 NOTE — Progress Notes (Deleted)
No show

## 2018-10-25 ENCOUNTER — Other Ambulatory Visit: Payer: Self-pay

## 2018-10-25 ENCOUNTER — Ambulatory Visit: Payer: 59 | Admitting: Neurology

## 2018-10-28 ENCOUNTER — Telehealth: Payer: Self-pay | Admitting: *Deleted

## 2018-10-28 NOTE — Telephone Encounter (Signed)
Pt no showed new patient appt on Monday 10/25/2018. She also canceled 08/25/2018 <24 hours in advance d/t work conflict.

## 2018-10-28 NOTE — Telephone Encounter (Signed)
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 But 2 new appointment no-shows are cause for dismissal. Shirlean Mylar, did you call patient?

## 2018-10-28 NOTE — Telephone Encounter (Signed)
No I have not called yet. It is on the list. I did send to angie so when she gets back she will send dismissal letter

## 2018-10-29 ENCOUNTER — Other Ambulatory Visit: Payer: Self-pay | Admitting: Allergy and Immunology

## 2018-10-30 MED FILL — GABAPENTIN 100 MG CAPSULE: 100 | 20 days supply | Qty: 120 | Fill #0

## 2018-11-01 ENCOUNTER — Other Ambulatory Visit: Payer: Self-pay

## 2018-11-01 ENCOUNTER — Ambulatory Visit (INDEPENDENT_AMBULATORY_CARE_PROVIDER_SITE_OTHER): Payer: 59 | Admitting: Internal Medicine

## 2018-11-01 ENCOUNTER — Encounter: Payer: Self-pay | Admitting: Internal Medicine

## 2018-11-01 VITALS — Ht 65.0 in | Wt 319.2 lb

## 2018-11-01 DIAGNOSIS — K582 Mixed irritable bowel syndrome: Secondary | ICD-10-CM | POA: Diagnosis not present

## 2018-11-01 DIAGNOSIS — R161 Splenomegaly, not elsewhere classified: Secondary | ICD-10-CM | POA: Diagnosis not present

## 2018-11-01 DIAGNOSIS — Z86010 Personal history of colonic polyps: Secondary | ICD-10-CM | POA: Diagnosis not present

## 2018-11-01 DIAGNOSIS — I8510 Secondary esophageal varices without bleeding: Secondary | ICD-10-CM | POA: Diagnosis not present

## 2018-11-01 DIAGNOSIS — D696 Thrombocytopenia, unspecified: Secondary | ICD-10-CM

## 2018-11-01 DIAGNOSIS — K219 Gastro-esophageal reflux disease without esophagitis: Secondary | ICD-10-CM | POA: Diagnosis not present

## 2018-11-01 DIAGNOSIS — K766 Portal hypertension: Secondary | ICD-10-CM

## 2018-11-01 DIAGNOSIS — K7581 Nonalcoholic steatohepatitis (NASH): Secondary | ICD-10-CM | POA: Diagnosis not present

## 2018-11-01 MED FILL — ALBUTEROL SULFATE HFA 108 (: 108 (90 BAS | 16 days supply | Qty: 18 | Fill #0

## 2018-11-01 MED FILL — QNASL 80 MCG/ACT AERS: 80 | 30 days supply | Qty: 11 | Fill #0

## 2018-11-01 NOTE — Patient Instructions (Addendum)
Please follow up with Dr [name] in 8 weeks.  Continue Vitamin E 800 IU daily  Continue Nexium 40 mg twice daily.  You will be due for a recall colonoscopy in 08/2021. We will send you a reminder in the mail when it gets closer to that time.

## 2018-11-01 NOTE — Progress Notes (Signed)
Subjective:    Patient ID: Linda Becker, female    DOB: [birth year], 28 y.o.   MRN: [id number]  This service was provided via telemedicine.  Doximity app with A/V communication The patient was located at home The provider was located in provider's GI office. The patient did consent to this telephone visit and is aware of possible charges through their insurance for this visit.   The persons participating in this telemedicine service were the patient and I. Time spent on call: 20 min   HPI Linda Becker is a 28 year old female with a history of NASH with portal hypertension, esophageal varices, splenomegaly with mild thrombocytopenia, morbid obesity, colonic polyps, hyperlipidemia, hypothyroidism, GERD who is seen in follow-up.  She is seen virtually today in the setting of COVID-19.  I last saw her in March for upper endoscopy and colonoscopy.  We performed upper endoscopy to evaluate for changes of portal hypertension in the upper GI tract after developing splenomegaly and thrombocytopenia.  Colonoscopy was performed for chronic diarrhea and abdominal pain.  EGD performed on 09/03/2018 revealed small esophageal varices, small hiatal hernia, moderate inflammation in the gastric antrum and a normal examined duodenum.  Biopsies from the stomach showed reactive gastropathy without H. pylori.  Small bowel biopsies were normal.  Colonoscopy performed on 09/03/2018 revealed 2 polyps in the ascending colon found to be sessile serrated polyps.  2 transverse colon polyps and one rectal polyp found to be hyperplastic.  Random colonic biopsies were normal and negative for microscopic colitis.  After procedure she was treated with rifaximin 550 mg 3 times daily x14 days for IBS D. He has also been seen by [place], do not [facility] on 09/21/2018.  At this visit there was question regarding the true etiology of her portal hypertension.  Her previous liver biopsy did not show cirrhosis or advanced  fibrosis.  There was consideration of performing a transjugular liver biopsy and measurement of portal pressure gradient and also reviewing echocardiogram previously done.  Discussion was had to rule out other causes for congestive hepatopathy.  They also plan to review prior liver biopsy slides for completeness.  The blocker was not started.  Linda Becker reports that she has made a significant diet change and she is cut out fatty foods and also all meats.  She has been eating a vegetarian diet for about a month now.  She is also dramatically reduced milk and dairy products though she will occasionally eat ice cream.  She has had reduction in her overall abdominal pain and diarrhea.  The rifaximin which she completed 14 days of therapy did not seem to improve her bowel pattern all that much.  Her bloating is better which she feels is more related to diet change.  Her bowel movements are back and forth she will have 1 week of harder stool and almost constipation followed by a week of loose stool and diarrhea.  On her diarrhea weeks she is using Imodium 4 to 6 tablets daily.  Finally there was some question about foggy brain or sleepiness.  This did not change while on rifaximin.  She expresses interest in improving her overall liver care and states that her liver is her predominant issue.  Her abdominal symptoms while not "great are tolerable".  She has no immediate plans for childbearing and is aware of the liver clinic recommendation to avoid pregnancy at this time.   Review of Systems As per HPI, otherwise negative  Current Medications, Allergies, Past Medical History,  Past Surgical History, Family History and Social History were reviewed in [employer] record.     Objective:   Physical Exam No physical exam, virtual visit  CBC    Component Value Date/Time   WBC 12.3 (H) 08/12/2018 1616   WBC 7.0 07/27/2018 1340   RBC 4.61 08/12/2018 1616   HGB 13.7 08/12/2018 1616    HGB 14.3 12/31/2016 1129   HCT 44.7 08/12/2018 1616   HCT 45.0 12/31/2016 1129   PLT 109 (L) 08/12/2018 1616   PLT 256 12/31/2016 1129   MCV 97.0 08/12/2018 1616   MCV 98 (H) 12/31/2016 1129   MCH 29.7 08/12/2018 1616   MCHC 30.6 08/12/2018 1616   RDW 13.3 08/12/2018 1616   RDW 12.7 12/31/2016 1129   LYMPHSABS 3.2 08/12/2018 1616   LYMPHSABS 2.1 12/31/2016 1129   MONOABS 0.6 08/12/2018 1616   EOSABS 0.3 08/12/2018 1616   EOSABS 0.3 12/31/2016 1129   BASOSABS 0.0 08/12/2018 1616   BASOSABS 0.0 12/31/2016 1129   CMP     Component Value Date/Time   NA 140 07/27/2018 1340   NA 138 02/06/2017   K 4.1 07/27/2018 1340   CL 107 07/27/2018 1340   CO2 25 07/27/2018 1340   GLUCOSE 132 (H) 07/27/2018 1340   BUN 10 07/27/2018 1340   BUN 9 02/06/2017   CREATININE 0.80 07/27/2018 1340   CALCIUM 9.0 07/27/2018 1340   PROT 6.4 07/27/2018 1340   ALBUMIN 3.8 07/27/2018 1340   AST 78 (H) 07/27/2018 1340   ALT 80 (H) 07/27/2018 1340   ALKPHOS 130 (H) 07/27/2018 1340   BILITOT 0.5 07/27/2018 1340   GFRNONAA >60 12/20/2017 1035   GFRAA >60 12/20/2017 1035   Lab Results  Component Value Date   INR 1.2 (H) 04/01/2018   INR 0.97 03/15/2018   INR 1.1 (H) 03/02/2018       Assessment & Plan:  28 year old female with a history of NASH with portal hypertension, esophageal varices, splenomegaly with mild thrombocytopenia, morbid obesity, colonic polyps, hyperlipidemia, hypothyroidism, GERD who is seen in follow-up.   1.  NASH/evidence of portal hypertension with small esophageal varices splenomegaly and thrombocytopenia --I completely agree with Dawn at [facility] liver clinic that her portal hypertension is not congruent with her liver biopsy result.  That said she does have clear evidence of portal hypertension and so I would recommend that we consider proceeding to repeat liver biopsy via a transjugular approach so that we can measure portal venous pressure gradient.  I will reach back out to liver  clinic to ensure that the plan moves forward --Liver biopsy by IR with measurement of portal pressure gradient --Based on these results we can consider nonselective beta-blocker therapy --No evidence for hepatic encephalopathy; I do not think she needs lactulose or rifaximin at this time --We discussed diet, exercise and weight reduction which is very important for her in her liver condition and also hopefully slowing down fibrosis and evolving portal hypertension.  I would like to know if she would be a surgical candidate for bariatric surgery but we need to determine her level of portal hypertension first --I expect she will need a new echocardiogram given that her last was about 18 months ago while pregnant --I agree that she should avoid pregnancy, the patient is aware and in agreement --Repeat office visit with me in about 8 weeks --Continue Vitamin E 800 IU daily  2.  IBS with alternating diarrhea and constipation --rifaximin therapy completed, did not  change in bowel pattern that much.  It does not sound like this is true chronic diarrhea but rather back-and-forth diarrhea and constipation.  Will monitor for now.  She may benefit from the addition of Benefiber to help even out bowel movements.  3.  GERD --well-controlled for now.  Continue Nexium 40 mg twice daily  4.  History of sessile serrated polyps --3-year surveillance interval given that 2 sessile serrated polyps were found at a very young age; repeat colonoscopy recommended for March 2023

## 2018-11-04 NOTE — Assessment & Plan Note (Deleted)
07/27/2018: Platelet count 102 02/04/2018: Platelet count 135 and prior to that 184  Remainder of the CBC with differential was normal Most likely etiology: NASH with portal hypertension and splenomegaly Patient is following with gastroenterology Dr. [name] to manage her liver issues.  We will see her on an as-needed basis since there is no further treatment options that we can do to help her with the thrombocytopenia.

## 2018-11-05 ENCOUNTER — Telehealth: Payer: Self-pay | Admitting: Hematology and Oncology

## 2018-11-05 NOTE — Telephone Encounter (Signed)
Called patient per 5/22 sch message - unable to reach patient . Left message for patient to call back if reschedule is still needed

## 2018-11-07 MED FILL — ONDANSETRON HCL 4 MG TABLET: 4 | 30 days supply | Qty: 30 | Fill #0

## 2018-11-07 MED FILL — SUBVENITE 150 MG TABS: 150 | 30 days supply | Qty: 30 | Fill #0

## 2018-11-09 DIAGNOSIS — F332 Major depressive disorder, recurrent severe without psychotic features: Secondary | ICD-10-CM | POA: Diagnosis not present

## 2018-11-09 DIAGNOSIS — F314 Bipolar disorder, current episode depressed, severe, without psychotic features: Secondary | ICD-10-CM | POA: Diagnosis not present

## 2018-11-11 ENCOUNTER — Inpatient Hospital Stay: Payer: 59 | Attending: Hematology and Oncology | Admitting: Hematology and Oncology

## 2018-11-11 ENCOUNTER — Inpatient Hospital Stay: Payer: 59

## 2018-11-11 DIAGNOSIS — F3181 Bipolar II disorder: Secondary | ICD-10-CM | POA: Diagnosis not present

## 2018-11-11 DIAGNOSIS — F408 Other phobic anxiety disorders: Secondary | ICD-10-CM | POA: Diagnosis not present

## 2018-11-11 MED FILL — GABAPENTIN 300 MG CAPSULE: 300 | 30 days supply | Qty: 30 | Fill #0

## 2018-11-12 MED FILL — SUBVENITE 200 MG TABS: 200 | 30 days supply | Qty: 30 | Fill #0

## 2018-11-12 MED FILL — LITHIUM CARBONATE ER 300 MG: 300 | 30 days supply | Qty: 120 | Fill #0

## 2018-11-12 MED FILL — LORazepam 0.5 MG TABS: 0.5 | 30 days supply | Qty: 15 | Fill #0

## 2018-11-15 ENCOUNTER — Encounter: Payer: Self-pay | Admitting: Neurology

## 2018-11-15 MED FILL — GABAPENTIN 100 MG CAPSULE: 100 | 20 days supply | Qty: 120 | Fill #0

## 2018-11-25 ENCOUNTER — Other Ambulatory Visit: Payer: Self-pay | Admitting: Internal Medicine

## 2018-11-25 ENCOUNTER — Other Ambulatory Visit: Payer: Self-pay

## 2018-11-25 DIAGNOSIS — R197 Diarrhea, unspecified: Secondary | ICD-10-CM

## 2018-11-25 DIAGNOSIS — K7581 Nonalcoholic steatohepatitis (NASH): Secondary | ICD-10-CM

## 2018-11-25 MED ORDER — DIPHENOXYLATE-ATROPINE 2.5-0.025 MG PO TABS: 1.0000 | ORAL_TABLET | Freq: Four times a day (QID) | ORAL | 1 refills | Status: DC | PRN

## 2018-11-25 MED FILL — ESOMEPRAZOLE MAG DR 40 MG C: 40 | 30 days supply | Qty: 60 | Fill #0

## 2018-11-25 MED FILL — DIPHENOXYLATE-ATROPINE 2.5-: 2.5-0.025 | 15 days supply | Qty: 60 | Fill #0

## 2018-11-25 MED FILL — DUPIXENT 300 MG/2 ML SAFE S: 300 | 28 days supply | Qty: 4 | Fill #0

## 2018-11-25 MED FILL — LEVOTHYROXINE 88 MCG TABLET: 88 | 30 days supply | Qty: 30 | Fill #0

## 2018-11-25 MED FILL — MONTELUKAST SOD 10 MG TAB: 10 | 30 days supply | Qty: 30 | Fill #0

## 2018-11-25 NOTE — Telephone Encounter (Signed)
Please have her come for CBC, CMP, INR GI pathogen panel and once submitted can briefly use lomotil 1 cap QIDPRN for diarrhea

## 2018-11-26 ENCOUNTER — Other Ambulatory Visit (INDEPENDENT_AMBULATORY_CARE_PROVIDER_SITE_OTHER): Payer: 59

## 2018-11-26 ENCOUNTER — Telehealth: Payer: 59 | Admitting: Family

## 2018-11-26 DIAGNOSIS — K7581 Nonalcoholic steatohepatitis (NASH): Secondary | ICD-10-CM | POA: Diagnosis not present

## 2018-11-26 DIAGNOSIS — R109 Unspecified abdominal pain: Secondary | ICD-10-CM

## 2018-11-26 DIAGNOSIS — R197 Diarrhea, unspecified: Secondary | ICD-10-CM | POA: Diagnosis not present

## 2018-11-26 DIAGNOSIS — R300 Dysuria: Secondary | ICD-10-CM

## 2018-11-26 LAB — COMPREHENSIVE METABOLIC PANEL
ALT: 72 U/L — ABNORMAL HIGH (ref 0–35)
AST: 62 U/L — ABNORMAL HIGH (ref 0–37)
Albumin: 3.9 g/dL (ref 3.5–5.2)
Alkaline Phosphatase: 126 U/L — ABNORMAL HIGH (ref 39–117)
BUN: 7 mg/dL (ref 6–23)
CO2: 25 mEq/L (ref 19–32)
Calcium: 8.8 mg/dL (ref 8.4–10.5)
Chloride: 109 mEq/L (ref 96–112)
Creatinine, Ser: 0.92 mg/dL (ref 0.40–1.20)
GFR: 73.76 mL/min (ref >60.00)
Glucose, Bld: 118 mg/dL — ABNORMAL HIGH (ref 70–99)
Potassium: 4.3 mEq/L (ref 3.5–5.1)
Sodium: 141 mEq/L (ref 135–145)
Total Bilirubin: 0.6 mg/dL (ref 0.2–1.2)
Total Protein: 6.2 g/dL (ref 6.0–8.3)

## 2018-11-26 LAB — CBC WITH DIFFERENTIAL/PLATELET
Basophils Absolute: 0 10*3/uL (ref 0.0–0.1)
Basophils Relative: 0.5 % (ref 0.0–3.0)
Eosinophils Absolute: 0.1 10*3/uL (ref 0.0–0.7)
Eosinophils Relative: 1.4 % (ref 0.0–5.0)
HCT: 41.1 % (ref 36.0–46.0)
Hemoglobin: 13.4 g/dL (ref 12.0–15.0)
Lymphocytes Relative: 18.9 % (ref 12.0–46.0)
Lymphs Abs: 1.7 10*3/uL (ref 0.7–4.0)
MCHC: 32.6 g/dL (ref 30.0–36.0)
MCV: 95.8 fl (ref 78.0–100.0)
Monocytes Absolute: 0.5 10*3/uL (ref 0.1–1.0)
Monocytes Relative: 5.3 % (ref 3.0–12.0)
Neutro Abs: 6.5 10*3/uL (ref 1.4–7.7)
Neutrophils Relative %: 73.9 % (ref 43.0–77.0)
Platelets: 86 10*3/uL — ABNORMAL LOW (ref 150.0–400.0)
RBC: 4.29 Mil/uL (ref 3.87–5.11)
RDW: 14.9 % (ref 11.5–15.5)
WBC: 8.8 10*3/uL (ref 4.0–10.5)

## 2018-11-26 LAB — PROTIME-INR
INR: 1.1 ratio — ABNORMAL HIGH (ref 0.8–1.0)
Prothrombin Time: 13 s (ref 9.6–13.1)

## 2018-11-26 NOTE — Progress Notes (Signed)
Greater than 5 minutes, yet less than 10 minutes of time have been spent researching, coordinating, and implementing care for this patient today.  Thank you for the details you included in the comment boxes. Those details are very helpful in determining the best course of treatment for you and help [place] to provide the best care.  Due to your symptoms, we must perform a physical exam and lab work for safety reasons to ensure the infection has not moved up your urinary tract. This is the standard of care and cannot be performed remotely.   Based on what you shared with me, I feel your condition warrants further evaluation and I recommend that you be seen for a face to face office visit.     NOTE: If you entered your credit card information for this eVisit, you will not be charged. You may see a "hold" on your card for the $35 but that hold will drop off and you will not have a charge processed.  If you are having a true medical emergency please call 911.  If you need an urgent face to face visit, [place] has four urgent care centers for your convenience.      [email or web address] to reserve your spot online an avoid wait times  [facility] [address] [place], [place] [address] Modified hours of operation: Monday-Friday, 12 PM to 6 PM  Saturday & Sunday 10 AM to 4 PM *Across the street from [place] (New Address!) [address] [place], [place] [address] *Just off [address], across the road from [place] hours of operation: Monday-Friday, 12 PM to 6 PM  Closed Saturday & Sunday   The following sites will take your insurance:  . [facility] [place] a Provider at this Location  [address] [place], [place] [address] . 10 am to 8 pm Monday-Friday . 12 pm to 8 pm Saturday-Sunday   . [facility] at [place] a Provider at this Location  [address], [place], [place] [address] . 8 am to 8 pm Monday-Friday . 9 am to 6 pm Saturday . 11 am to 6 pm Sunday   . [facility] at [place] Get Driving Directions  [address] [place],  [address] . 8 am to 8 pm Monday-Friday . 8 am to 4 pm Saturday-Sunday   Your e-visit answers were reviewed by a board certified advanced clinical practitioner to complete your personal care plan.  Thank you for using e-Visits.

## 2018-12-01 MED FILL — ONDANSETRON HCL 4 MG TABLET: 4 | 30 days supply | Qty: 30 | Fill #0

## 2018-12-02 ENCOUNTER — Telehealth: Payer: Self-pay | Admitting: *Deleted

## 2018-12-02 NOTE — Telephone Encounter (Signed)
MedImpact has approved patient's Xifaxan 550 mg TID dosing #42 tablets from 12/01/18-11/30/19. Drug must be filled at [facility]. [place] Reference Number [id number].

## 2018-12-03 ENCOUNTER — Other Ambulatory Visit: Payer: Self-pay

## 2018-12-03 MED ORDER — QNASL 80 MCG/ACT NA AERS: INHALATION_SPRAY | NASAL | 3 refills | Status: DC

## 2018-12-03 MED ORDER — ALBUTEROL SULFATE HFA 108 (90 BASE) MCG/ACT IN AERS: 2.0000 | INHALATION_SPRAY | RESPIRATORY_TRACT | 1 refills | Status: DC | PRN

## 2018-12-03 MED FILL — SYMBICORT 160-4.5 MCG INH: 160-4.5 | 30 days supply | Qty: 10 | Fill #0

## 2018-12-03 MED FILL — ALBUTEROL SULFATE HFA 108 (: 108 (90 BAS | 17 days supply | Qty: 18 | Fill #0

## 2018-12-03 MED FILL — QNASL 80 MCG/ACT AERS: 80 | 30 days supply | Qty: 11 | Fill #0

## 2018-12-08 DIAGNOSIS — F314 Bipolar disorder, current episode depressed, severe, without psychotic features: Secondary | ICD-10-CM | POA: Diagnosis not present

## 2018-12-08 DIAGNOSIS — F332 Major depressive disorder, recurrent severe without psychotic features: Secondary | ICD-10-CM | POA: Diagnosis not present

## 2018-12-09 DIAGNOSIS — F3181 Bipolar II disorder: Secondary | ICD-10-CM | POA: Diagnosis not present

## 2018-12-09 DIAGNOSIS — F408 Other phobic anxiety disorders: Secondary | ICD-10-CM | POA: Diagnosis not present

## 2018-12-10 MED FILL — lamoTRIgine 150 MG TABS: 150 | 30 days supply | Qty: 30 | Fill #0

## 2018-12-10 MED FILL — LITHIUM CARBONATE ER 300 MG: 300 | 30 days supply | Qty: 120 | Fill #0

## 2018-12-10 MED FILL — ONDANSETRON HCL 8 MG TABLET: 8 | 30 days supply | Qty: 30 | Fill #0

## 2018-12-10 MED FILL — GABAPENTIN 300 MG CAPSULE: 300 | 30 days supply | Qty: 30 | Fill #0

## 2018-12-10 MED FILL — GABAPENTIN 100 MG CAPSULE: 100 | 10 days supply | Qty: 60 | Fill #0

## 2018-12-10 MED FILL — ARIPiprazole 2 MG TABS: 2 | 30 days supply | Qty: 30 | Fill #0

## 2018-12-22 DIAGNOSIS — F3181 Bipolar II disorder: Secondary | ICD-10-CM | POA: Diagnosis not present

## 2018-12-22 DIAGNOSIS — F408 Other phobic anxiety disorders: Secondary | ICD-10-CM | POA: Diagnosis not present

## 2018-12-22 MED FILL — TOPIRAMATE 25 MG TAB: 25 | 30 days supply | Qty: 120 | Fill #0

## 2018-12-23 MED FILL — DIPHENOXYLATE-ATROPINE 2.5-: 2.5-0.025 | 15 days supply | Qty: 60 | Fill #1

## 2018-12-23 MED FILL — DUPIXENT 300 MG/2 ML SAFE S: 300 | 28 days supply | Qty: 4 | Fill #1

## 2018-12-27 ENCOUNTER — Encounter: Payer: Self-pay | Admitting: *Deleted

## 2018-12-27 MED FILL — MONTELUKAST SOD 10 MG TAB: 10 | 30 days supply | Qty: 30 | Fill #1

## 2018-12-27 MED FILL — DICYCLOMINE 20 MG TABLET: 20 | 40 days supply | Qty: 120 | Fill #0

## 2018-12-27 MED FILL — ESOMEPRAZOLE MAG DR 40 MG C: 40 | 30 days supply | Qty: 60 | Fill #1

## 2018-12-28 ENCOUNTER — Ambulatory Visit (INDEPENDENT_AMBULATORY_CARE_PROVIDER_SITE_OTHER): Payer: 59 | Admitting: Internal Medicine

## 2018-12-28 DIAGNOSIS — K7581 Nonalcoholic steatohepatitis (NASH): Secondary | ICD-10-CM

## 2018-12-28 DIAGNOSIS — K766 Portal hypertension: Secondary | ICD-10-CM

## 2018-12-28 DIAGNOSIS — K580 Irritable bowel syndrome with diarrhea: Secondary | ICD-10-CM

## 2018-12-28 NOTE — Progress Notes (Signed)
Subjective:    Patient ID: Linda Becker, female    DOB: [birth year], 28 y.o.   MRN: [id number]  This service was provided via telemedicine.  Doximity app with A/V communication The patient was located in her car. The provider was located in provider's GI office. The patient did consent to this telephone visit and is aware of possible charges through their insurance for this visit.   The persons participating in this telemedicine service were the patient and I. Time spent on call: 17 minutes   HPI Linda Becker is a 28 year old female with a history of NASH with portal hypertension, esophageal varices, splenomegaly and mild thrombocytopenia, GERD, morbid obesity, colonic polyps, hyperlipidemia, hypothyroidism who is seen for follow-up.  She is seen virtually today in the setting of COVID-19 was last seen on 11/01/2018.  She reports that she is doing about the same.  She has not yet followed up with [place] at [facility].  This appointment is scheduled for next week.  We had discussed repeating liver biopsy with measurement of portal gradient given that her prior liver biopsy was not consistent with cirrhosis yet she apparently has portal hypertension.  Her heartburn has been worse recently despite Nexium 40 mg twice a day.  She has taken her second dose before bedtime rather than before her last meal.  No dysphagia or odynophagia.  Bowels are loose and diarrhea is predominant but currently well controlled with Bentyl, Lomotil and Imodium before meals.  No blood in her stool or melena.  She has thought a lot and wishes to proceed with a bariatric surgery consult.  She is talk to her therapist and is titrating her psychiatric medications.  She is tapering off lithium and has been placed on Topamax.  Topamax is helping with headaches but has not curbed her appetite.  She realizes that she needs to lose weight to help her overall medical conditions.  She is asking for a referral to  bariatric surgery.   Review of Systems As per HPI, otherwise negative  Current Medications, Allergies, Past Medical History, Past Surgical History, Family History and Social History were reviewed in [employer] record.     Objective:   Physical Exam No physical exam today, virtual visit       Assessment & Plan:  28 year old female with a history of NASH with portal hypertension, esophageal varices, splenomegaly and mild thrombocytopenia, GERD, morbid obesity, colonic polyps, hyperlipidemia, hypothyroidism who is seen for follow-up.  1.  NASH/portal hypertension with small esophageal varices, splenomegaly and thrombocytopenia --she will follow-up with [facility] in the next couple of weeks.  She will discuss repeating liver biopsy with our measurements of portal pressures.   --[facility] clinic follow-up in the coming weeks with discussion of repeating liver biopsy and measurement portal venous pressures --No evidence for hepatic encephalopathy --Continue Vitamin E therapy 800 international units daily --Weight loss, see #2  2.  Weight loss --very important for her particularly in the setting of her fatty liver disease with portal hypertension.  She is interested in bariatric surgery which I think would be appropriate.  I will refer her to see Dr. Windle Guard --Referral to Dr. Windle Guard at with bariatric surgery at [place]  3.  IBS with predominantly diarrhea --no great response to rifaximin therapy.  Currently using Lomotil, Imodium and Bentyl 3 times a day as needed.  Continue this regimen.  No history of microscopic colitis or IBD.  4.  GERD --heartburn of  late despite Nexium.  I asked that she take her second dose before dinner rather than before bedtime we will see if this helps her symptoms --Nexium 40 mg twice daily AC  5.  History of sessile serrated polyps --surveillance colonoscopy recommended March 2023

## 2018-12-30 ENCOUNTER — Telehealth: Payer: Self-pay | Admitting: *Deleted

## 2018-12-30 NOTE — Telephone Encounter (Signed)
42 pages of records faxed to [facility] to consider bariatric surgery by Dr Kae Heller. Patient has been advised that she will first need to complete online seminars before being considered for surgery. She is advised to go to [email or web address] or call [phone] for information.

## 2018-12-30 NOTE — Patient Instructions (Addendum)
Keep your appointment with [facility] liver clinic in the coming weeks for discussion of repeating liver biopsy and measurement of portal venous pressures  Continue Vitamin E therapy 800 IU daily  We would like for you to see Dr Windle Guard at [facility] for possible bariatric surgery. However, prior to being considered for this, you will need to complete some online seminars. You can go to [email or web address] or you may call [phone] for more information. We have already sent your information to [facility].  Continue using Lomotil, Imodium and Bentyl 3 times a day as needed.   Nexium 40 mg twice daily before meals (take the second does before dinner rather than before bedtime to see if this helps symptoms)  You will be due for a recall colonoscopy in 08/2021. We will send you a reminder in the mail when it gets closer to that time.

## 2019-01-03 MED FILL — GABAPENTIN 100 MG CAPSULE: 100 | 30 days supply | Qty: 180 | Fill #0

## 2019-01-03 MED FILL — ARIPiprazole 5 MG TABS: 5 | 30 days supply | Qty: 30 | Fill #0

## 2019-01-03 MED FILL — OLOPATADINE HCL 0.1% EYE DR: 0.1 | 25 days supply | Qty: 5 | Fill #0

## 2019-01-04 ENCOUNTER — Other Ambulatory Visit: Payer: Self-pay | Admitting: Nurse Practitioner

## 2019-01-04 ENCOUNTER — Other Ambulatory Visit (HOSPITAL_COMMUNITY): Payer: Self-pay | Admitting: Nurse Practitioner

## 2019-01-04 DIAGNOSIS — K7581 Nonalcoholic steatohepatitis (NASH): Secondary | ICD-10-CM

## 2019-01-04 DIAGNOSIS — K766 Portal hypertension: Secondary | ICD-10-CM

## 2019-01-06 DIAGNOSIS — F3181 Bipolar II disorder: Secondary | ICD-10-CM | POA: Diagnosis not present

## 2019-01-06 DIAGNOSIS — F408 Other phobic anxiety disorders: Secondary | ICD-10-CM | POA: Diagnosis not present

## 2019-01-07 MED FILL — TOPIRAMATE 50 MG TABLET: 50 | 30 days supply | Qty: 60 | Fill #0

## 2019-01-07 MED FILL — LORazepam 0.5 MG TABS: 0.5 | 30 days supply | Qty: 15 | Fill #0

## 2019-01-07 MED FILL — LITHIUM CARBONATE ER 300 MG: 300 | 30 days supply | Qty: 60 | Fill #0

## 2019-01-07 MED FILL — GABAPENTIN 300 MG CAPSULE: 300 | 30 days supply | Qty: 60 | Fill #0

## 2019-01-07 MED FILL — ONDANSETRON HCL 8 MG TABLET: 8 | 30 days supply | Qty: 30 | Fill #0

## 2019-01-07 MED FILL — ARIPIPRAZOLE 10 MG TABS: 10 | 30 days supply | Qty: 30 | Fill #0

## 2019-01-07 MED FILL — lamoTRIgine 150 MG TABS: 150 | 30 days supply | Qty: 30 | Fill #0

## 2019-01-10 ENCOUNTER — Other Ambulatory Visit: Payer: Self-pay | Admitting: Radiology

## 2019-01-10 ENCOUNTER — Ambulatory Visit
Admission: RE | Admit: 2019-01-10 | Discharge: 2019-01-10 | Disposition: A | Discharge status: Home / Self Care | Payer: 59 | Source: Ambulatory Visit | Attending: Nurse Practitioner | Admitting: Nurse Practitioner

## 2019-01-10 DIAGNOSIS — K7581 Nonalcoholic steatohepatitis (NASH): Secondary | ICD-10-CM

## 2019-01-11 ENCOUNTER — Other Ambulatory Visit (HOSPITAL_COMMUNITY): Payer: 59

## 2019-01-11 ENCOUNTER — Ambulatory Visit (HOSPITAL_COMMUNITY): Payer: 59

## 2019-01-13 ENCOUNTER — Telehealth: Payer: Self-pay | Admitting: Internal Medicine

## 2019-01-13 NOTE — Telephone Encounter (Signed)
Please see note below. 

## 2019-01-13 NOTE — Telephone Encounter (Signed)
Pt requested a letter of medical necessity for bariatric surgery

## 2019-01-14 DIAGNOSIS — F3170 Bipolar disorder, currently in remission, most recent episode unspecified: Secondary | ICD-10-CM | POA: Diagnosis not present

## 2019-01-14 DIAGNOSIS — Z79899 Other long term (current) drug therapy: Secondary | ICD-10-CM | POA: Diagnosis not present

## 2019-01-14 DIAGNOSIS — E559 Vitamin D deficiency, unspecified: Secondary | ICD-10-CM | POA: Diagnosis not present

## 2019-01-15 ENCOUNTER — Encounter: Payer: Self-pay | Admitting: Internal Medicine

## 2019-01-15 NOTE — Telephone Encounter (Signed)
Letter written and routed to [facility]

## 2019-01-17 ENCOUNTER — Other Ambulatory Visit: Payer: Self-pay | Admitting: Radiology

## 2019-01-17 NOTE — Telephone Encounter (Signed)
Letter mailed and sent to pt via mychart.

## 2019-01-18 ENCOUNTER — Other Ambulatory Visit (HOSPITAL_COMMUNITY): Payer: Self-pay | Admitting: Radiology

## 2019-01-19 ENCOUNTER — Encounter (HOSPITAL_COMMUNITY): Payer: Self-pay

## 2019-01-19 ENCOUNTER — Other Ambulatory Visit: Payer: Self-pay | Admitting: Interventional Radiology

## 2019-01-19 ENCOUNTER — Ambulatory Visit (HOSPITAL_COMMUNITY)
Admission: RE | Admit: 2019-01-19 | Discharge: 2019-01-19 | Disposition: A | Discharge status: Home / Self Care | Payer: 59 | Source: Ambulatory Visit | Attending: Nurse Practitioner | Admitting: Nurse Practitioner

## 2019-01-19 ENCOUNTER — Other Ambulatory Visit (HOSPITAL_COMMUNITY): Payer: Self-pay | Admitting: Nurse Practitioner

## 2019-01-19 ENCOUNTER — Other Ambulatory Visit: Payer: Self-pay

## 2019-01-19 DIAGNOSIS — K7581 Nonalcoholic steatohepatitis (NASH): Secondary | ICD-10-CM | POA: Diagnosis not present

## 2019-01-19 DIAGNOSIS — E669 Obesity, unspecified: Secondary | ICD-10-CM | POA: Insufficient documentation

## 2019-01-19 DIAGNOSIS — Z79890 Hormone replacement therapy (postmenopausal): Secondary | ICD-10-CM | POA: Insufficient documentation

## 2019-01-19 DIAGNOSIS — K766 Portal hypertension: Secondary | ICD-10-CM

## 2019-01-19 DIAGNOSIS — K219 Gastro-esophageal reflux disease without esophagitis: Secondary | ICD-10-CM | POA: Insufficient documentation

## 2019-01-19 DIAGNOSIS — K589 Irritable bowel syndrome without diarrhea: Secondary | ICD-10-CM | POA: Diagnosis not present

## 2019-01-19 DIAGNOSIS — E039 Hypothyroidism, unspecified: Secondary | ICD-10-CM | POA: Diagnosis not present

## 2019-01-19 DIAGNOSIS — Z79899 Other long term (current) drug therapy: Secondary | ICD-10-CM | POA: Diagnosis not present

## 2019-01-19 DIAGNOSIS — E785 Hyperlipidemia, unspecified: Secondary | ICD-10-CM | POA: Insufficient documentation

## 2019-01-19 DIAGNOSIS — J45909 Unspecified asthma, uncomplicated: Secondary | ICD-10-CM | POA: Insufficient documentation

## 2019-01-19 DIAGNOSIS — I8500 Esophageal varices without bleeding: Secondary | ICD-10-CM | POA: Insufficient documentation

## 2019-01-19 DIAGNOSIS — R161 Splenomegaly, not elsewhere classified: Secondary | ICD-10-CM | POA: Insufficient documentation

## 2019-01-19 DIAGNOSIS — K740 Hepatic fibrosis: Secondary | ICD-10-CM | POA: Diagnosis not present

## 2019-01-19 HISTORY — PX: IR US GUIDE VASC ACCESS RIGHT: IMG2390

## 2019-01-19 HISTORY — PX: IR VENOGRAM HEPATIC W HEMODYNAMIC EVALUATION: IMG692

## 2019-01-19 HISTORY — PX: IR TRANSCATHETER BX: IMG713

## 2019-01-19 LAB — CBC WITH DIFFERENTIAL/PLATELET
Abs Immature Granulocytes: 0.02 10*3/uL (ref 0.00–0.07)
Basophils Absolute: 0 10*3/uL (ref 0.0–0.1)
Basophils Relative: 0 %
Eosinophils Absolute: 0.1 10*3/uL (ref 0.0–0.5)
Eosinophils Relative: 1 %
HCT: 44.2 % (ref 36.0–46.0)
Hemoglobin: 14 g/dL (ref 12.0–15.0)
Immature Granulocytes: 0 %
Lymphocytes Relative: 29 %
Lymphs Abs: 2.2 10*3/uL (ref 0.7–4.0)
MCH: 31.5 pg (ref 26.0–34.0)
MCHC: 31.7 g/dL (ref 30.0–36.0)
MCV: 99.3 fL (ref 80.0–100.0)
Monocytes Absolute: 0.5 10*3/uL (ref 0.1–1.0)
Monocytes Relative: 6 %
Neutro Abs: 4.9 10*3/uL (ref 1.7–7.7)
Neutrophils Relative %: 64 %
Platelets: 83 10*3/uL — ABNORMAL LOW (ref 150–400)
RBC: 4.45 MIL/uL (ref 3.87–5.11)
RDW: 13.8 % (ref 11.5–15.5)
WBC: 7.7 10*3/uL (ref 4.0–10.5)
nRBC: 0 % (ref 0.0–0.2)

## 2019-01-19 LAB — COMPREHENSIVE METABOLIC PANEL
ALT: 66 U/L — ABNORMAL HIGH (ref 0–44)
AST: 61 U/L — ABNORMAL HIGH (ref 15–41)
Albumin: 3.6 g/dL (ref 3.5–5.0)
Alkaline Phosphatase: 125 U/L (ref 38–126)
Anion gap: 5 (ref 5–15)
BUN: 9 mg/dL (ref 6–20)
CO2: 23 mmol/L (ref 22–32)
Calcium: 8.6 mg/dL — ABNORMAL LOW (ref 8.9–10.3)
Chloride: 114 mmol/L — ABNORMAL HIGH (ref 98–111)
Creatinine, Ser: 0.9 mg/dL (ref 0.44–1.00)
GFR calc Af Amer: >60 mL/min (ref >60)
GFR calc non Af Amer: >60 mL/min (ref >60)
Glucose, Bld: 106 mg/dL — ABNORMAL HIGH (ref 70–99)
Potassium: 4.3 mmol/L (ref 3.5–5.1)
Sodium: 142 mmol/L (ref 135–145)
Total Bilirubin: 0.6 mg/dL (ref 0.3–1.2)
Total Protein: 6.5 g/dL (ref 6.5–8.1)

## 2019-01-19 LAB — PROTIME-INR
INR: 1.1 (ref 0.8–1.2)
Prothrombin Time: 13.7 seconds (ref 11.4–15.2)

## 2019-01-19 MED ORDER — MIDAZOLAM HCL 2 MG/2ML IJ SOLN
INTRAMUSCULAR | Status: AC | PRN
  Administered 2019-01-19 (×4): 1 mg via INTRAVENOUS

## 2019-01-19 MED ORDER — FENTANYL CITRATE (PF) 100 MCG/2ML IJ SOLN
INTRAMUSCULAR | Status: AC
  Filled 2019-01-19: qty 2

## 2019-01-19 MED ORDER — IOHEXOL 300 MG/ML  SOLN
50.0000 mL | Freq: Once | INTRAMUSCULAR | Status: AC | PRN
  Administered 2019-01-19: 10 mL

## 2019-01-19 MED ORDER — SODIUM CHLORIDE 0.9 % IV SOLN
INTRAVENOUS | Status: DC
  Administered 2019-01-19: 13:00:00 via INTRAVENOUS

## 2019-01-19 MED ORDER — LIDOCAINE HCL (PF) 1 % IJ SOLN
INTRAMUSCULAR | Status: AC | PRN
  Administered 2019-01-19: 5 mL

## 2019-01-19 MED ORDER — FENTANYL CITRATE (PF) 100 MCG/2ML IJ SOLN
INTRAMUSCULAR | Status: AC | PRN
  Administered 2019-01-19 (×4): 50 ug via INTRAVENOUS

## 2019-01-19 MED ORDER — LIDOCAINE HCL 1 % IJ SOLN
INTRAMUSCULAR | Status: AC
  Filled 2019-01-19: qty 20

## 2019-01-19 MED ORDER — HYDROCODONE-ACETAMINOPHEN 5-325 MG PO TABS
1.0000 | ORAL_TABLET | ORAL | Status: DC | PRN
  Administered 2019-01-19: 2 via ORAL
  Filled 2019-01-19: qty 2

## 2019-01-19 MED ORDER — MIDAZOLAM HCL 2 MG/2ML IJ SOLN
INTRAMUSCULAR | Status: AC
  Filled 2019-01-19: qty 4

## 2019-01-19 NOTE — Discharge Instructions (Signed)
Liver Biopsy, Care After These instructions give you information on caring for yourself after your procedure. Your doctor may also give you more specific instructions. Call your doctor if you have any problems or questions after your procedure. What can I expect after the procedure? After the procedure, it is common to have:  Pain and soreness where the biopsy was done.  Bruising around the area where the biopsy was done.  Sleepiness and be tired for a few days. Follow these instructions at home: Medicines  Take over-the-counter and prescription medicines only as told by your doctor.  If you were prescribed an antibiotic medicine, take it as told by your doctor. Do not stop taking the antibiotic even if you start to feel better.  Do not take medicines such as aspirin and ibuprofen. These medicines can thin your blood. Do not take these medicines unless your doctor tells you to take them.  If you are taking prescription pain medicine, take actions to prevent or treat constipation. Your doctor may recommend that you: ? Drink enough fluid to keep your pee (urine) clear or pale yellow. ? Take over-the-counter or prescription medicines. ? Eat foods that are high in fiber, such as fresh fruits and vegetables, whole grains, and beans. ? Limit foods that are high in fat and processed sugars, such as fried and sweet foods. Caring for your cut  Follow instructions from your doctor about how to take care of your cuts from surgery (incisions). Make sure you: ? Wash your hands with soap and water before you change your bandage (dressing). If you cannot use soap and water, use hand sanitizer. ? Change your bandage as told by your doctor. ? Leave stitches (sutures), skin glue, or skin tape (adhesive) strips in place. They may need to stay in place for 2 weeks or longer. If tape strips get loose and curl up, you may trim the loose edges. Do not remove tape strips completely unless your doctor says it is  okay.  Check your cuts every day for signs of infection. Check for: ? Redness, swelling, or more pain. ? Fluid or blood. ? Pus or a bad smell. ? Warmth.  Do not take baths, swim, or use a hot tub until your doctor says it is okay to do so. Activity   Rest at home for 1-2 days or as told by your doctor. ? Avoid sitting for a long time without moving. Get up to take short walks every 1-2 hours.  Return to your normal activities as told by your doctor. Ask what activities are safe for you.  Do not do these things in the first 24 hours: ? Drive. ? Use machinery. ? Take a bath or shower.  Do not lift more than 10 pounds (4.5 kg) or play contact sports for the first 2 weeks. General instructions   Do not drink alcohol in the first week after the procedure.  Have someone stay with you for at least 24 hours after the procedure.  Get your test results. Ask your doctor or the department that is doing the test: ? When will my results be ready? ? How will I get my results? ? What are my treatment options? ? What other tests do I need? ? What are my next steps?  Keep all follow-up visits as told by your doctor. This is important. Contact a doctor if:  A cut bleeds and leaves more than just a small spot of blood.  A cut is red, puffs up (  swells), or hurts more than before.  Fluid or something else comes from a cut.  A cut smells bad.  You have a fever or chills. Get help right away if:  You have swelling, bloating, or pain in your belly (abdomen).  You get dizzy or faint.  You have a rash.  You feel sick to your stomach (nauseous) or throw up (vomit).  You have trouble breathing, feel short of breath, or feel faint.  Your chest hurts.  You have problems talking or seeing.  You have trouble with your balance or moving your arms or legs. Summary  After the procedure, it is common to have pain, soreness, bruising, and tiredness.  Your doctor will tell you how to  take care of yourself at home. Change your bandage, take your medicines, and limit your activities as told by your doctor.  Call your doctor if you have symptoms of infection. Get help right away if your belly swells, your cut bleeds a lot, or you have trouble talking or breathing. This information is not intended to replace advice given to you by your health care provider. Make sure you discuss any questions you have with your health care provider. Document Released: 03/11/2008 Document Revised: 06/12/2017 Document Reviewed: 06/12/2017 Elsevier Patient Education  [place].    Moderate Conscious Sedation, Adult, Care After These instructions provide you with information about caring for yourself after your procedure. Your health care provider may also give you more specific instructions. Your treatment has been planned according to current medical practices, but problems sometimes occur. Call your health care provider if you have any problems or questions after your procedure. What can I expect after the procedure? After your procedure, it is common:  To feel sleepy for several hours.  To feel clumsy and have poor balance for several hours.  To have poor judgment for several hours.  To vomit if you eat too soon. Follow these instructions at home: For at least 24 hours after the procedure:   Do not: ? Participate in activities where you could fall or become injured. ? Drive. ? Use heavy machinery. ? Drink alcohol. ? Take sleeping pills or medicines that cause drowsiness. ? Make important decisions or sign legal documents. ? Take care of children on your own.  Rest. Eating and drinking  Follow the diet recommended by your health care provider.  If you vomit: ? Drink water, juice, or soup when you can drink without vomiting. ? Make sure you have little or no nausea before eating solid foods. General instructions  Have a responsible adult stay with you until you are  awake and alert.  Take over-the-counter and prescription medicines only as told by your health care provider.  If you smoke, do not smoke without supervision.  Keep all follow-up visits as told by your health care provider. This is important. Contact a health care provider if:  You keep feeling nauseous or you keep vomiting.  You feel light-headed.  You develop a rash.  You have a fever. Get help right away if:  You have trouble breathing. This information is not intended to replace advice given to you by your health care provider. Make sure you discuss any questions you have with your health care provider. Document Released: 03/23/2013 Document Revised: 05/15/2017 Document Reviewed: 09/22/2015 Elsevier Patient Education  2020 [employer].

## 2019-01-19 NOTE — Procedures (Signed)
  Procedure: R hepatic venogram, wedge and free pressure measurements, transcatheter core liver biopsy   EBL:   minimal Complications:  none immediate  See full dictation in [employer].  Dillard Cannon MD Main # [phone] Pager  [phone]

## 2019-01-19 NOTE — H&P (Signed)
Referring Physician(s): Drazek,Dawn  Supervising Physician: Arne Cleveland  Patient Status:  [facility] OP  Chief Complaint:  "I'm having a liver biopsy"  Subjective: Patient familiar to IR service from liver biopsy on 03/15/2018 which revealed steatohepatitis.  She has a past medical history significant for NASH, portal hypertension, esophageal varices, splenomegaly, thrombocytopenia, GERD, obesity, hyperlipidemia, hypothyroidism, IBS and colon polyps.  She presents again today for transjugular liver biopsy with hepatic vein/ portal vein pressure measurements.  She currently denies fever, headache, chest pain, dyspnea, cough, abdominal/back pain, nausea, vomiting or bleeding.  She is quite anxious.   Past Medical History:  Diagnosis Date  . Allergic rhinitis    remote hx of allergy shots  . Asthma    since childhood  on controller meds extrinsic dr Carmelina Peal  . Gallbladder sludge   . Gastropathy   . GERD (gastroesophageal reflux disease)    on nexium  for  long term sx since childhood  . H/O miscarriage, not currently pregnant    [redacted] weeks  march 2016  . Hepatic steatosis   . Hypothyroidism   . Migraine   . Murmur    pt reports MVP  . Sessile colonic polyp   . Splenomegaly   . Steatohepatitis   . Syncope    under eval ? cause  . Tachycardia    episodes with near syncope eval dr Nadyne Coombes  on no meds dced LABA   Past Surgical History:  Procedure Laterality Date  . BIOPSY  09/03/2018   Procedure: BIOPSY;  Surgeon: Jerene Bears, MD;  Location: [facility];  Service: Gastroenterology;;  . broken right femur  2008   rod placed  . COLONOSCOPY WITH PROPOFOL N/A 09/03/2018   Procedure: COLONOSCOPY WITH PROPOFOL;  Surgeon: Jerene Bears, MD;  Location: [facility];  Service: Gastroenterology;  Laterality: N/A;  . DILATION AND CURETTAGE OF UTERUS    . ESOPHAGOGASTRODUODENOSCOPY (EGD) WITH PROPOFOL N/A 09/03/2018   Procedure: ESOPHAGOGASTRODUODENOSCOPY (EGD) WITH PROPOFOL;  Surgeon:  Jerene Bears, MD;  Location: [facility];  Service: Gastroenterology;  Laterality: N/A;  . OB ultrasound N/A 12/01/2017   see report  . POLYPECTOMY  09/03/2018   Procedure: POLYPECTOMY;  Surgeon: Jerene Bears, MD;  Location: [facility];  Service: Gastroenterology;;  . Jacques Earthly  . WISDOM TOOTH EXTRACTION        Allergies: Progesterone and Penicillins  Medications: Prior to Admission medications   Medication Sig Start Date End Date Taking? Authorizing Provider  acetaminophen (TYLENOL) 500 MG tablet Take 500 mg by mouth every 6 (six) hours as needed for moderate pain or headache.    [provider]  albuterol (PROVENTIL) (2.5 MG/3ML) 0.083% nebulizer solution Use 1 vial nebulized every 4-6 hours as needed for cough, wheeze, shortness of breath or chest tightness Patient taking differently: Take 2.5 mg by nebulization every 4 (four) hours as needed for wheezing or shortness of breath.  08/09/18   Kozlow, Donnamarie Poag, MD  albuterol (VENTOLIN HFA) 108 (90 Base) MCG/ACT inhaler Inhale 2 puffs into the lungs every 4 (four) hours as needed for wheezing or shortness of breath. 12/03/18   Kozlow, Donnamarie Poag, MD  Beclomethasone Dipropionate (QNASL) 80 MCG/ACT AERS INSTILL 1 SPRAY IN EACH NOSTRIL ONCE DAILY AS DIRECTED 12/03/18   Kozlow, Donnamarie Poag, MD  cetirizine (ZYRTEC) 5 MG tablet Take 5 mg by mouth 2 (two) times daily.    [provider]  Cholecalciferol (VITAMIN D) 50 MCG (2000 UT) tablet Take 4,000 Units by  mouth daily.    [provider]  dicyclomine (BENTYL) 20 MG tablet Take 10 mg by mouth every 4-6 hours as needed. Patient taking differently: Take 20 mg by mouth 3 (three) times daily.  05/26/18   Esterwood, Amy S, PA-C  diphenoxylate-atropine (LOMOTIL) 2.5-0.025 MG tablet Take 1 tablet by mouth 4 (four) times daily as needed for diarrhea or loose stools. 11/25/18   Pyrtle, [name], MD  Dupilumab (DUPIXENT) 300 MG/2ML SOSY Inject 300 mg into the skin every 14 (fourteen)  days. 07/15/18   Tresa Garter, MD  esomeprazole (NEXIUM) 40 MG capsule TAKE 1 CAPSULE BY MOUTH TWO TIMES DAILY BEFORE A MEAL 11/25/18   Pyrtle, [name], MD  gabapentin (NEURONTIN) 100 MG capsule Take 200 mg by mouth every 4 (four) hours as needed.     [provider]  gabapentin (NEURONTIN) 300 MG capsule Take 300 mg by mouth 2 (two) times a day.     [provider]  lamoTRIgine (LAMICTAL) 150 MG tablet Take 150 mg by mouth daily.    [provider]  levothyroxine (SYNTHROID, LEVOTHROID) 88 MCG tablet TAKE 1 TABLET BY MOUTH DAILY. Patient taking differently: Take 88 mcg by mouth daily before breakfast.  08/25/18   Elayne Snare, MD  lithium carbonate (LITHOBID) 300 MG CR tablet Take 900 mg by mouth at bedtime.     [provider]  loperamide (IMODIUM) 2 MG capsule Take 2 mg by mouth as needed for diarrhea or loose stools.    [provider]  LORazepam (ATIVAN) 0.5 MG tablet Take 0.25-0.5 mg by mouth every 6 (six) hours as needed for anxiety. 08/20/18   [provider]  montelukast (SINGULAIR) 10 MG tablet TAKE 1 TABLET BY MOUTH DAILY. Patient taking differently: Take 10 mg by mouth daily.  07/26/18   Kozlow, Donnamarie Poag, MD  olopatadine (PATANOL) 0.1 % ophthalmic solution Use one drop in each eye twice daily if needed Patient taking differently: Place 1 drop into both eyes 2 (two) times daily as needed for allergies.  05/25/18   Kozlow, Donnamarie Poag, MD  ondansetron (ZOFRAN) 4 MG tablet Take 4 mg by mouth See admin instructions. Take 4 mg in the morning and may take an additional 4 mg as needed for nausea    [provider]  Prenatal Vit-Fe Fumarate-FA (PRENATAL FORMULA PO) Take 1 tablet by mouth daily.    [provider]  promethazine (PHENERGAN) 25 MG tablet Take 25-50 mg by mouth every 6 (six) hours as needed for nausea or vomiting.     [provider]  SYMBICORT 160-4.5 MCG/ACT inhaler INHALE 2 PUFFS BY MOUTH TWICE A DAY TO  PREVENT COUGH OR WHEEZE. RINSE, GARGLE, AND SPIT AFTER USE. Patient taking differently: Inhale 2 puffs into the lungs 2 (two) times daily.  07/08/18   Kozlow, Donnamarie Poag, MD  vitamin E 200 UNIT capsule Take 400 Units by mouth daily.    [provider]     Vital Signs: Blood pressure 144/98, temp 98.2, heart rate 95, respirations 18, O2 sat 100% room air   Physical Exam awake, alert.  Chest clear to auscultation bilaterally.  Heart with regular rate/ rhythm; abdomen obese, soft, positive bowel sounds, nontender.  Extremities with full range of motion  Imaging: No results found.  Labs:  CBC: Recent Labs    03/15/18 1158 07/27/18 1340 08/12/18 1616 11/26/18 1028  WBC 6.9 7.0 12.3* 8.8  HGB 13.3 13.6 13.7 13.4  HCT 43.7 41.6 44.7 41.1  PLT 135* 102.0* 109* 86.0*    COAGS: Recent Labs    03/02/18 0823 03/15/18 1158 04/01/18 1504 11/26/18 1028  INR 1.1* 0.97 1.2* 1.1*    BMP: Recent Labs    02/04/18 1457 07/27/18 1340 11/26/18 1028  NA 139 140 141  K 4.0 4.1 4.3  CL 108 107 109  CO2 24 25 25   GLUCOSE 119* 132* 118*  BUN 16 10 7   CALCIUM 9.4 9.0 8.8  CREATININE 0.94 0.80 0.92    LIVER FUNCTION TESTS: Recent Labs    03/02/18 0823 04/01/18 1504 07/27/18 1340 11/26/18 1028  BILITOT 0.4 0.4 0.5 0.6  AST 76* 95* 78* 62*  ALT 108* 104* 80* 72*  ALKPHOS 149* 155* 130* 126*  PROT 6.4 7.3 6.4 6.2  ALBUMIN 3.6 4.1 3.8 3.9    Assessment and Plan: Pt with past medical history significant for NASH, portal hypertension, esophageal varices, splenomegaly, thrombocytopenia, GERD, obesity, hyperlipidemia, hypothyroidism, IBS and colon polyps.  Status post prior liver biopsy on 03/15/2018 yielding steatohepatitis ;she presents again today for transjugular liver biopsy with hepatic vein/ portal vein pressure measurements. Risks and benefits of procedure was discussed with the patient   including, but not limited to bleeding, infection, damage to adjacent structures or  low yield requiring additional tests.  All of the questions were answered and there is agreement to proceed.  Consent signed and in chart.  LABS PENDING   Electronically Signed: D. Rowe Robert, PA-C 01/19/2019, 12:00 PM   I spent a total of 25 minutes at the the patient's bedside AND on the patient's hospital floor or unit, greater than 50% of which was counseling/coordinating care for transjugular liver biopsy

## 2019-01-28 ENCOUNTER — Telehealth: Payer: Self-pay | Admitting: *Deleted

## 2019-01-28 ENCOUNTER — Other Ambulatory Visit: Payer: Self-pay | Admitting: Endocrinology

## 2019-01-28 NOTE — Telephone Encounter (Signed)
Form for [employer] nebulizer supplies has been completed, signed and faxed back to [employer]. Form has been placed in bulk scanning.

## 2019-01-31 ENCOUNTER — Other Ambulatory Visit: Payer: Self-pay | Admitting: Internal Medicine

## 2019-01-31 ENCOUNTER — Other Ambulatory Visit: Payer: Self-pay | Admitting: Endocrinology

## 2019-01-31 ENCOUNTER — Other Ambulatory Visit: Payer: Self-pay | Admitting: Allergy and Immunology

## 2019-01-31 ENCOUNTER — Telehealth: Payer: Self-pay | Admitting: Endocrinology

## 2019-01-31 DIAGNOSIS — J4520 Mild intermittent asthma, uncomplicated: Secondary | ICD-10-CM | POA: Diagnosis not present

## 2019-01-31 MED FILL — DIPHENOXYLATE-ATROPINE 2.5-: 2.5-0.025 | 10 days supply | Qty: 60 | Fill #0

## 2019-01-31 MED FILL — MONTELUKAST SOD 10 MG TAB: 10 | 30 days supply | Qty: 30 | Fill #0

## 2019-01-31 MED FILL — LEVOTHYROXINE 88 MCG TABLET: 88 | 15 days supply | Qty: 15 | Fill #0

## 2019-01-31 MED FILL — ESOMEPRAZOLE MAG DR 40 MG C: 40 | 30 days supply | Qty: 60 | Fill #2

## 2019-01-31 MED FILL — DUPIXENT 300 MG/2 ML SAFE S: 300 | 28 days supply | Qty: 4 | Fill #2

## 2019-01-31 NOTE — Telephone Encounter (Signed)
-----   Message from Elayne Snare, MD sent at 01/29/2019 12:36 PM EDT ----- Is overdue for follow-up, needs appointment with labs, virtual visit preferred

## 2019-01-31 NOTE — Telephone Encounter (Signed)
ATC to schedule

## 2019-02-01 NOTE — Telephone Encounter (Signed)
Patient is scheduled for a Doxy on 02/22/19 at 2:15 p.m. Patient will have her labs drawn at the [facility] lab the week before.

## 2019-02-03 DIAGNOSIS — F3181 Bipolar II disorder: Secondary | ICD-10-CM | POA: Diagnosis not present

## 2019-02-03 DIAGNOSIS — F408 Other phobic anxiety disorders: Secondary | ICD-10-CM | POA: Diagnosis not present

## 2019-02-03 MED FILL — GABAPENTIN 100 MG CAPSULE: 100 | 30 days supply | Qty: 180 | Fill #0

## 2019-02-03 MED FILL — ONDANSETRON HCL 8 MG TABLET: 8 | 30 days supply | Qty: 30 | Fill #0

## 2019-02-03 MED FILL — TOPIRAMATE 50 MG TABLET: 50 | 30 days supply | Qty: 60 | Fill #0

## 2019-02-03 MED FILL — lamoTRIgine 150 MG TABS: 150 | 30 days supply | Qty: 30 | Fill #0

## 2019-02-03 MED FILL — ARIPIPRAZOLE 10 MG TABS: 10 | 30 days supply | Qty: 30 | Fill #0

## 2019-02-04 MED FILL — LORazepam 0.5 MG TABS: 0.5 | 30 days supply | Qty: 15 | Fill #1

## 2019-02-04 MED FILL — LITHIUM CARBONATE ER 300 MG: 300 | 30 days supply | Qty: 30 | Fill #0

## 2019-02-04 MED FILL — GABAPENTIN 300 MG CAPSULE: 300 | 30 days supply | Qty: 60 | Fill #1

## 2019-02-08 ENCOUNTER — Encounter: Payer: 59 | Admitting: Internal Medicine

## 2019-02-08 DIAGNOSIS — K766 Portal hypertension: Secondary | ICD-10-CM | POA: Diagnosis not present

## 2019-02-08 DIAGNOSIS — K7581 Nonalcoholic steatohepatitis (NASH): Secondary | ICD-10-CM | POA: Diagnosis not present

## 2019-02-15 DIAGNOSIS — Z124 Encounter for screening for malignant neoplasm of cervix: Secondary | ICD-10-CM | POA: Diagnosis not present

## 2019-02-15 DIAGNOSIS — Z01419 Encounter for gynecological examination (general) (routine) without abnormal findings: Secondary | ICD-10-CM | POA: Diagnosis not present

## 2019-02-15 LAB — HM PAP SMEAR

## 2019-02-16 MED FILL — SYMBICORT 160-4.5 MCG INH: 160-4.5 | 30 days supply | Qty: 10 | Fill #1

## 2019-02-16 MED FILL — ALBUTEROL SULFATE HFA 108 (: 108 (90 BAS | 17 days supply | Qty: 18 | Fill #1

## 2019-02-16 MED FILL — QNASL 80 MCG/ACT AERS: 80 | 30 days supply | Qty: 11 | Fill #1

## 2019-02-18 DIAGNOSIS — E039 Hypothyroidism, unspecified: Secondary | ICD-10-CM | POA: Diagnosis not present

## 2019-02-18 DIAGNOSIS — J45909 Unspecified asthma, uncomplicated: Secondary | ICD-10-CM | POA: Diagnosis not present

## 2019-02-18 DIAGNOSIS — K219 Gastro-esophageal reflux disease without esophagitis: Secondary | ICD-10-CM | POA: Diagnosis not present

## 2019-02-18 DIAGNOSIS — K7581 Nonalcoholic steatohepatitis (NASH): Secondary | ICD-10-CM | POA: Diagnosis not present

## 2019-02-18 DIAGNOSIS — K766 Portal hypertension: Secondary | ICD-10-CM | POA: Diagnosis not present

## 2019-02-18 DIAGNOSIS — R000 Tachycardia, unspecified: Secondary | ICD-10-CM | POA: Diagnosis not present

## 2019-02-22 ENCOUNTER — Other Ambulatory Visit: Payer: Self-pay | Admitting: Surgery

## 2019-02-22 ENCOUNTER — Ambulatory Visit: Payer: 59 | Admitting: Allergy and Immunology

## 2019-02-22 ENCOUNTER — Other Ambulatory Visit (HOSPITAL_COMMUNITY): Payer: Self-pay | Admitting: Surgery

## 2019-02-22 ENCOUNTER — Other Ambulatory Visit: Payer: Self-pay

## 2019-02-22 ENCOUNTER — Encounter: Payer: Self-pay | Admitting: Endocrinology

## 2019-02-22 ENCOUNTER — Encounter: Payer: 59 | Admitting: Endocrinology

## 2019-02-22 DIAGNOSIS — F332 Major depressive disorder, recurrent severe without psychotic features: Secondary | ICD-10-CM | POA: Diagnosis not present

## 2019-02-22 DIAGNOSIS — F314 Bipolar disorder, current episode depressed, severe, without psychotic features: Secondary | ICD-10-CM | POA: Diagnosis not present

## 2019-02-22 NOTE — Progress Notes (Signed)
This encounter was created in error - please disregard.

## 2019-02-24 ENCOUNTER — Encounter: Payer: Self-pay | Admitting: Internal Medicine

## 2019-03-04 ENCOUNTER — Ambulatory Visit (HOSPITAL_COMMUNITY)
Admission: RE | Admit: 2019-03-04 | Discharge: 2019-03-04 | Disposition: A | Discharge status: Home / Self Care | Payer: 59 | Source: Ambulatory Visit | Attending: Surgery | Admitting: Surgery

## 2019-03-04 ENCOUNTER — Other Ambulatory Visit: Payer: Self-pay

## 2019-03-04 DIAGNOSIS — Z01818 Encounter for other preprocedural examination: Secondary | ICD-10-CM | POA: Diagnosis not present

## 2019-03-07 MED FILL — ARIPIPRAZOLE 10 MG TABS: 10 | 30 days supply | Qty: 30 | Fill #1

## 2019-03-07 MED FILL — ONDANSETRON HCL 8 MG TABLET: 8 | 30 days supply | Qty: 30 | Fill #1

## 2019-03-07 MED FILL — DIPHENOXYLATE-ATROPINE 2.5-: 2.5-0.025 | 10 days supply | Qty: 60 | Fill #1

## 2019-03-07 MED FILL — lamoTRIgine 150 MG TABS: 150 | 30 days supply | Qty: 30 | Fill #1

## 2019-03-07 MED FILL — ESOMEPRAZOLE MAG DR 40 MG C: 40 | 30 days supply | Qty: 60 | Fill #3

## 2019-03-09 NOTE — Progress Notes (Signed)
Chief Complaint  Patient presents with  . Annual Exam    Pt states she is getting gastric sleeve surgery no other cocerns    HPI: Patient  [name]  28 y.o. comes in today for Preventive Health Care visit   Bipolar NASH and  Obesity and has portal  hypertension and  Low platelets felt to be related.    Progressed dramatically since  Her cholestatic  disease in pregnancy.    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 INFLUENZA VACCINE  01/15/2019  . PAP-Cervical Cytology Screening  02/14/2022  . PAP SMEAR-Modifier  02/14/2022  . TETANUS/TDAP  11/12/2027  . 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 GI /GU: No adominal pain, vomiting, change in bowel habits. No blood in the stool. No significant GU symptoms. SKIN/HEME: ,no acute skin rashes suspicious lesions or bleeding. No lymphadenopathy, nodules, masses.  NEURO/ PSYCH:  No neurologic signs such as weakness numbness. meds    depression anxiety. IMM/ Allergy: No unusual infections.  Allergy .   REST of 12 system review negative except as per HPI   Past Medical History:  Diagnosis Date  . Allergic rhinitis    remote hx of allergy shots  . Asthma    since childhood  on controller meds extrinsic dr Carmelina Peal  . Gallbladder sludge   . Gastropathy   . GERD (gastroesophageal reflux disease)    on nexium  for  long term sx since childhood  . H/O  miscarriage, not currently pregnant    [redacted] weeks  march 2016  . Hepatic steatosis   . Hypothyroidism   . Migraine   . Murmur    pt reports MVP  . Sessile colonic polyp   . Splenomegaly   . Steatohepatitis   . Syncope    under eval ? cause  . Tachycardia    episodes with near syncope eval dr Nadyne Coombes  on no meds dced LABA    Past Surgical History:  Procedure Laterality Date  . BIOPSY  09/03/2018   Procedure: BIOPSY;  Surgeon: Jerene Bears, MD;  Location: [facility];  Service: Gastroenterology;;  . broken right femur  2008   rod placed  . COLONOSCOPY WITH PROPOFOL N/A 09/03/2018   Procedure: COLONOSCOPY WITH PROPOFOL;  Surgeon: Jerene Bears, MD;  Location: [facility];  Service: Gastroenterology;  Laterality: N/A;  . DILATION AND CURETTAGE OF UTERUS    . ESOPHAGOGASTRODUODENOSCOPY (EGD) WITH PROPOFOL N/A 09/03/2018   Procedure: ESOPHAGOGASTRODUODENOSCOPY (EGD) WITH PROPOFOL;  Surgeon: Jerene Bears, MD;  Location: [facility];  Service: Gastroenterology;  Laterality: N/A;  . IR TRANSCATHETER BX  01/19/2019  . IR US GUIDE VASC ACCESS RIGHT  01/19/2019  . IR VENOGRAM HEPATIC W HEMODYNAMIC EVALUATION  01/19/2019  . OB ultrasound N/A 12/01/2017   see report  .  POLYPECTOMY  09/03/2018   Procedure: POLYPECTOMY;  Surgeon: Jerene Bears, MD;  Location: [facility];  Service: Gastroenterology;;  . Jacques Earthly  . WISDOM TOOTH EXTRACTION      Family History  Problem Relation Age of Onset  . Asthma Father   . Arthritis Father   . Hyperlipidemia Father   . Heart disease Father   . Hypertension Father   . Diabetes Father   . Colon polyps Father   . Arthritis Mother   . Hyperlipidemia Mother   . Heart disease Mother   . Hypertension Mother   . Diabetes Mother   . Kidney disease Mother   . Liver disease Mother   . Bleeding Disorder Mother   . Colon polyps Mother   . Arthritis Maternal Grandmother   . Breast cancer Maternal Grandmother   . Thyroid disease Maternal Grandmother    . Colon cancer Paternal Grandfather   . Diabetes Paternal Grandfather   . Liver cancer Paternal Grandfather   . Stomach cancer Paternal Grandfather   . Breast cancer Paternal Grandmother   . Diabetes Paternal Grandmother   . Breast cancer Paternal Aunt   . Esophageal cancer Paternal Aunt   . Breast cancer Maternal Aunt   . Diabetes Maternal Grandfather   . Pancreatic cancer Maternal Grandfather     Social History   Socioeconomic History  . Marital status: Single    Spouse name: Not on file  . Number of children: Not on file  . Years of education: Not on file  . Highest education level: Not on file  Occupational History  . Not on file  Social Needs  . Financial resource strain: Not on file  . Food insecurity    Worry: Not on file    Inability: Not on file  . Transportation needs    Medical: Not on file    Non-medical: Not on file  Tobacco Use  . Smoking status: Never Smoker  . Smokeless tobacco: Never Used  Substance and Sexual Activity  . Alcohol use: No    Alcohol/week: 0.0 standard drinks    Frequency: Never  . Drug use: No  . Sexual activity: Yes    Birth control/protection: None  Lifestyle  . Physical activity    Days per week: Not on file    Minutes per session: Not on file  . Stress: Not on file  Relationships  . Social Herbalist on phone: Not on file    Gets together: Not on file    Attends religious service: Not on file    Active member of club or organization: Not on file    Attends meetings of clubs or organizations: Not on file    Relationship status: Not on file  Other Topics Concern  . 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 acetaminophen (TYLENOL) 500 MG  tablet Take 500 mg by mouth every 6 (six) hours as needed for moderate pain or headache.    . albuterol (PROVENTIL) (2.5 MG/3ML) 0.083% nebulizer solution Use 1 vial nebulized every 4-6 hours as needed for cough, wheeze, shortness of breath  or chest tightness (Patient taking differently: Take 2.5 mg by nebulization every 4 (four) hours as needed for wheezing or shortness of breath. ) 180 mL 5  . albuterol (VENTOLIN HFA) 108 (90 Base) MCG/ACT inhaler Inhale 2 puffs into the lungs every 4 (four) hours as needed for wheezing or shortness of breath. 18 g 1  . Beclomethasone Dipropionate (QNASL) 80 MCG/ACT AERS INSTILL 1 SPRAY IN EACH NOSTRIL ONCE DAILY AS DIRECTED 10.6 g 3  . cetirizine (ZYRTEC) 5 MG tablet Take 5 mg by mouth 2 (two) times daily.    . Cholecalciferol (VITAMIN D) 50 MCG (2000 UT) tablet Take 4,000 Units by mouth daily.    Marland Kitchen dicyclomine (BENTYL) 20 MG tablet Take 10 mg by mouth every 4-6 hours as needed. (Patient taking differently: Take 20 mg by mouth 3 (three) times daily. ) 120 tablet 3  . diphenoxylate-atropine (LOMOTIL) 2.5-0.025 MG tablet TAKE 1 TABLET BY MOUTH FOUR TIMES DAILY AS NEEDED FOR DIARRHEA 60 tablet 1  . Dupilumab (DUPIXENT) 300 MG/2ML SOSY Inject 300 mg into the skin every 14 (fourteen) days. 4 mL 11  . esomeprazole (NEXIUM) 40 MG capsule TAKE 1 CAPSULE BY MOUTH TWO TIMES DAILY BEFORE A MEAL 60 capsule 4  . gabapentin (NEURONTIN) 100 MG capsule Take 200 mg by mouth every 4 (four) hours as needed.     . gabapentin (NEURONTIN) 300 MG capsule Take 300 mg by mouth 2 (two) times a day.     . lamoTRIgine (LAMICTAL) 150 MG tablet Take 150 mg by mouth daily.    Marland Kitchen levothyroxine (SYNTHROID) 88 MCG tablet TAKE 1 TABLET BY MOUTH DAILY. 15 tablet 0  . loperamide (IMODIUM) 2 MG capsule Take 2 mg by mouth as needed for diarrhea or loose stools.    Marland Kitchen LORazepam (ATIVAN) 0.5 MG tablet Take 0.25-0.5 mg by mouth every 6 (six) hours as needed for anxiety.    . montelukast (SINGULAIR) 10 MG  tablet TAKE 1 TABLET BY MOUTH DAILY. 30 tablet 0  . olopatadine (PATANOL) 0.1 % ophthalmic solution Use one drop in each eye twice daily if needed (Patient taking differently: Place 1 drop into both eyes 2 (two) times daily as needed for allergies. ) 5 mL 5  . ondansetron (ZOFRAN) 4 MG tablet Take 4 mg by mouth See admin instructions. Take 4 mg in the morning and may take an additional 4 mg as needed for nausea    . Prenatal Vit-Fe Fumarate-FA (PRENATAL FORMULA PO) Take 1 tablet by mouth daily.    . promethazine (PHENERGAN) 25 MG tablet Take 25-50 mg by mouth every 6 (six) hours as needed for nausea or vomiting.     . SYMBICORT 160-4.5 MCG/ACT inhaler INHALE 2 PUFFS BY MOUTH TWICE A DAY TO PREVENT COUGH OR WHEEZE. RINSE, GARGLE, AND SPIT AFTER USE. (Patient taking differently: Inhale 2 puffs into the lungs 2 (two) times daily. ) 10.2 g 5  . vitamin E 200 UNIT capsule Take 400 Units by mouth daily.    Marland Kitchen lithium carbonate (LITHOBID) 300 MG CR tablet Take 900 mg by mouth at bedtime.      No facility-administered medications prior to visit.      EXAM:  BP 118/68 (BP Location: Right Arm, Patient Position: Sitting, Cuff Size: Normal)   Pulse 95   Temp 98.2 F (36.8 C) (Temporal)   Ht 5' 5"  (1.651 m)   Wt (!) 308 lb 12.8 oz (140.1 kg)   LMP 03/04/2019 (Exact Date) Comment: patient signed preg test  waiver  SpO2 98%   BMI 51.39 kg/m   Body mass index is 51.39 kg/m. Wt Readings from Last 3 Encounters:  03/11/19 (!) 308 lb 12.8 oz (140.1 kg)  12/27/18 (!) 319 lb (144.7 kg)  11/01/18 (!) 319 lb 3.2 oz (144.8 kg)    Physical Exam: Vital signs reviewed VOJ:[id number] is a well-developed well-nourished alert cooperative    who appearsr stated age in no acute distress.  HEENT: normocephalic atraumatic , Eyes: PERRL EOM's full, conjunctiva clear, ., Ears: no deformity EAC's clear TMs with normal landmarks. Mouth:  OP,masked NECK: supple without masses, thyromegaly or bruits. CHEST/PULM:  Clear to  auscultation and percussion breath sounds equal no wheeze , rales or rhonchi. No chest wall deformities or tenderness. CV: PMI is nondisplaced, S1 S2 no gallops, murmurs, rubs. Peripheral pulses are full without delay.No JVD .  ABDOMEN: Bowel sounds normal nontender  No guard or rebound, no hepato splenomegaly large abd wall no CVA tenderness.  No hernia. No oascites felt  Extremtities:  No clubbing cyanosis or edema, no acute joint swelling or redness no focal atrophy NEURO:  Oriented x3, cranial nerves 3-12 appear to be intact, no obvious focal weakness,gait within normal limits no abnormal reflexes or asymmetrical SKIN: No acute rashes normal turgor, color, no bruising or petechiae.  Many tats   PSYCH: Oriented, good eye contact, no obvious depression anxiety, cognition and judgment appear normal. LN: no cervical axillary inguinal adenopathy  Lab Results  Component Value Date   WBC 6.3 03/11/2019   HGB 14.1 03/11/2019   HCT 44.0 03/11/2019   PLT 81.0 Repeated and verified X2. (L) 03/11/2019   GLUCOSE 88 03/11/2019   CHOL 184 03/11/2019   TRIG 99.0 03/11/2019   HDL 41.80 03/11/2019   LDLCALC 122 (H) 03/11/2019   ALT 58 (H) 03/11/2019   AST 44 (H) 03/11/2019   NA 143 03/11/2019   K 4.4 03/11/2019   CL 109 03/11/2019   CREATININE 1.00 03/11/2019   BUN 8 03/11/2019   CO2 25 03/11/2019   TSH 1.59 03/11/2019   INR 1.1 01/19/2019   HGBA1C 5.2 03/11/2019    BP Readings from Last 3 Encounters:  03/11/19 118/68  01/19/19 118/70  01/19/19 (!) 144/98    Lab plan  reviewed with patient   ASSESSMENT AND PLAN:  Discussed the following assessment and plan:    ICD-10-CM   1. Visit for preventive health examination  F81.82 Basic metabolic panel    CBC with Differential/Platelet    Hemoglobin A1c    Hepatic function panel    Lipid panel    TSH  2. Need for influenza vaccination  Z23 Flu Vaccine QUAD 6+ mos PF IM (Fluarix Quad PF)  3. Morbid obesity (HCC)  X93.71 Basic metabolic  panel    CBC with Differential/Platelet    Hemoglobin A1c    Hepatic function panel    Lipid panel    TSH  4. Thrombocytopenia (HCC)  I96.7 Basic metabolic panel    CBC with Differential/Platelet    Hemoglobin A1c    Hepatic function panel    Lipid panel    TSH  5. NASH (nonalcoholic steatohepatitis)  E93.81 Basic metabolic panel    CBC with Differential/Platelet    Hemoglobin A1c    Hepatic function panel    Lipid panel    TSH  6. Portal hypertension (HCC)  O17.5 Basic metabolic panel    CBC with Differential/Platelet    Hemoglobin A1c    Hepatic function panel  Lipid panel    TSH   Progressive nash with portal ht  ( mom has End stage liver disease with ascites with nash?) age 59 Proceeding with weight loss surgery  Appropriate .   Counseled  Patient Care Team: Panosh, Standley Brooking, MD as PCP - General (Internal Medicine) Neldon Mc Donnamarie Poag, MD as Consulting Physician (Allergy and Immunology) Adrian Prows, MD as Consulting Physician (Cardiology) Elayne Snare, MD as Consulting Physician (Endocrinology) Pyrtle, [name], MD as Consulting Physician (Gastroenterology) Patient Instructions  Best of luck  with your surgery .  Will notify you  of labs when available.  Keep [place] informed .     Health Maintenance, Female Adopting a healthy lifestyle and getting preventive care are important in promoting health and wellness. Ask your health care provider about:  The right schedule for you to have regular tests and exams.  Things you can do on your own to prevent diseases and keep yourself healthy. What should I know about diet, weight, and exercise? Eat a healthy diet   Eat a diet that includes plenty of vegetables, fruits, low-fat dairy products, and lean protein.  Do not eat a lot of foods that are high in solid fats, added sugars, or sodium. Maintain a healthy weight Body mass index (BMI) is used to identify weight problems. It estimates body fat based on height and weight. Your  health care provider can help determine your BMI and help you achieve or maintain a healthy weight. Get regular exercise Get regular exercise. This is one of the most important things you can do for your health. Most adults should:  Exercise for at least 150 minutes each week. The exercise should increase your heart rate and make you sweat (moderate-intensity exercise).  Do strengthening exercises at least twice a week. This is in addition to the moderate-intensity exercise.  Spend less time sitting. Even light physical activity can be beneficial. Watch cholesterol and blood lipids Have your blood tested for lipids and cholesterol at 26 years of age, then have this test every 5 years. Have your cholesterol levels checked more often if:  Your lipid or cholesterol levels are high.  You are older than 26 years of age.  You are at high risk for heart disease. What should I know about cancer screening? Depending on your health history and family history, you may need to have cancer screening at various ages. This may include screening for:  Breast cancer.  Cervical cancer.  Colorectal cancer.  Skin cancer.  Lung cancer. What should I know about heart disease, diabetes, and high blood pressure? Blood pressure and heart disease  High blood pressure causes heart disease and increases the risk of stroke. This is more likely to develop in people who have high blood pressure readings, are of African descent, or are overweight.  Have your blood pressure checked: ? Every 3-5 years if you are 39-37 years of age. ? Every year if you are 70 years old or older. Diabetes Have regular diabetes screenings. This checks your fasting blood sugar level. Have the screening done:  Once every three years after age 60 if you are at a normal weight and have a low risk for diabetes.  More often and at a younger age if you are overweight or have a high risk for diabetes. What should I know about  preventing infection? Hepatitis B If you have a higher risk for hepatitis B, you should be screened for this virus. Talk with your health care provider  to find out if you are at risk for hepatitis B infection. Hepatitis C Testing is recommended for:  Everyone born from [birth year] through 1963.  Anyone with known risk factors for hepatitis C. Sexually transmitted infections (STIs)  Get screened for STIs, including gonorrhea and chlamydia, if: ? You are sexually active and are younger than 26 years of age. ? You are older than 26 years of age and your health care provider tells you that you are at risk for this type of infection. ? Your sexual activity has changed since you were last screened, and you are at increased risk for chlamydia or gonorrhea. Ask your health care provider if you are at risk.  Ask your health care provider about whether you are at high risk for HIV. Your health care provider may recommend a prescription medicine to help prevent HIV infection. If you choose to take medicine to prevent HIV, you should first get tested for HIV. You should then be tested every 3 months for as long as you are taking the medicine. Pregnancy  If you are about to stop having your period (premenopausal) and you may become pregnant, seek counseling before you get pregnant.  Take 400 to 800 micrograms (mcg) of folic acid every day if you become pregnant.  Ask for birth control (contraception) if you want to prevent pregnancy. Osteoporosis and menopause Osteoporosis is a disease in which the bones lose minerals and strength with aging. This can result in bone fractures. If you are 82 years old or older, or if you are at risk for osteoporosis and fractures, ask your health care provider if you should:  Be screened for bone loss.  Take a calcium or vitamin D supplement to lower your risk of fractures.  Be given hormone replacement therapy (HRT) to treat symptoms of menopause. Follow these  instructions at home: Lifestyle  Do not use any products that contain nicotine or tobacco, such as cigarettes, e-cigarettes, and chewing tobacco. If you need help quitting, ask your health care provider.  Do not use street drugs.  Do not share needles.  Ask your health care provider for help if you need support or information about quitting drugs. Alcohol use  Do not drink alcohol if: ? Your health care provider tells you not to drink. ? You are pregnant, may be pregnant, or are planning to become pregnant.  If you drink alcohol: ? Limit how much you use to 0-1 drink a day. ? Limit intake if you are breastfeeding.  Be aware of how much alcohol is in your drink. In the [place] one drink equals one 12 oz bottle of beer (355 mL), one 5 oz glass of wine (148 mL), or one 1 oz glass of hard liquor (44 mL). General instructions  Schedule regular health, dental, and eye exams.  Stay current with your vaccines.  Tell your health care provider if: ? You often feel depressed. ? You have ever been abused or do not feel safe at home. Summary  Adopting a healthy lifestyle and getting preventive care are important in promoting health and wellness.  Follow your health care provider's instructions about healthy diet, exercising, and getting tested or screened for diseases.  Follow your health care provider's instructions on monitoring your cholesterol and blood pressure. This information is not intended to replace advice given to you by your health care provider. Make sure you discuss any questions you have with your health care provider. Document Released: 12/16/2010 Document Revised: 05/26/2018 Document Reviewed: 05/26/2018  Elsevier Patient Education  2020 [place] Panosh M.D.

## 2019-03-11 ENCOUNTER — Encounter: Payer: Self-pay | Admitting: Internal Medicine

## 2019-03-11 ENCOUNTER — Ambulatory Visit (INDEPENDENT_AMBULATORY_CARE_PROVIDER_SITE_OTHER): Payer: 59 | Admitting: Internal Medicine

## 2019-03-11 ENCOUNTER — Other Ambulatory Visit: Payer: Self-pay

## 2019-03-11 VITALS — BP 118/68 | HR 95 | Temp 98.2°F | Ht 65.0 in | Wt 308.8 lb

## 2019-03-11 DIAGNOSIS — K766 Portal hypertension: Secondary | ICD-10-CM | POA: Diagnosis not present

## 2019-03-11 DIAGNOSIS — D696 Thrombocytopenia, unspecified: Secondary | ICD-10-CM | POA: Diagnosis not present

## 2019-03-11 DIAGNOSIS — K7581 Nonalcoholic steatohepatitis (NASH): Secondary | ICD-10-CM | POA: Diagnosis not present

## 2019-03-11 DIAGNOSIS — Z0000 Encounter for general adult medical examination without abnormal findings: Secondary | ICD-10-CM | POA: Diagnosis not present

## 2019-03-11 DIAGNOSIS — Z23 Encounter for immunization: Secondary | ICD-10-CM

## 2019-03-11 LAB — TSH: TSH: 1.59 u[IU]/mL (ref 0.35–4.50)

## 2019-03-11 LAB — CBC WITH DIFFERENTIAL/PLATELET
Basophils Absolute: 0 10*3/uL (ref 0.0–0.1)
Basophils Relative: 0.4 % (ref 0.0–3.0)
Eosinophils Absolute: 0.1 10*3/uL (ref 0.0–0.7)
Eosinophils Relative: 1.3 % (ref 0.0–5.0)
HCT: 44 % (ref 36.0–46.0)
Hemoglobin: 14.1 g/dL (ref 12.0–15.0)
Lymphocytes Relative: 23.8 % (ref 12.0–46.0)
Lymphs Abs: 1.5 10*3/uL (ref 0.7–4.0)
MCHC: 32.1 g/dL (ref 30.0–36.0)
MCV: 94.9 fl (ref 78.0–100.0)
Monocytes Absolute: 0.3 10*3/uL (ref 0.1–1.0)
Monocytes Relative: 5.4 % (ref 3.0–12.0)
Neutro Abs: 4.3 10*3/uL (ref 1.4–7.7)
Neutrophils Relative %: 69.1 % (ref 43.0–77.0)
Platelets: 81 10*3/uL — ABNORMAL LOW (ref 150.0–400.0)
RBC: 4.63 Mil/uL (ref 3.87–5.11)
RDW: 14.9 % (ref 11.5–15.5)
WBC: 6.3 10*3/uL (ref 4.0–10.5)

## 2019-03-11 LAB — LIPID PANEL
Cholesterol: 184 mg/dL (ref 0–200)
HDL: 41.8 mg/dL (ref >39.00)
LDL Cholesterol: 122 mg/dL — ABNORMAL HIGH (ref 0–99)
NonHDL: 141.78
Total CHOL/HDL Ratio: 4
Triglycerides: 99 mg/dL (ref 0.0–149.0)
VLDL: 19.8 mg/dL (ref 0.0–40.0)

## 2019-03-11 LAB — BASIC METABOLIC PANEL
BUN: 8 mg/dL (ref 6–23)
CO2: 25 mEq/L (ref 19–32)
Calcium: 9.2 mg/dL (ref 8.4–10.5)
Chloride: 109 mEq/L (ref 96–112)
Creatinine, Ser: 1 mg/dL (ref 0.40–1.20)
GFR: 66.85 mL/min (ref >60.00)
Glucose, Bld: 88 mg/dL (ref 70–99)
Potassium: 4.4 mEq/L (ref 3.5–5.1)
Sodium: 143 mEq/L (ref 135–145)

## 2019-03-11 LAB — HEPATIC FUNCTION PANEL
ALT: 58 U/L — ABNORMAL HIGH (ref 0–35)
AST: 44 U/L — ABNORMAL HIGH (ref 0–37)
Albumin: 4.1 g/dL (ref 3.5–5.2)
Alkaline Phosphatase: 116 U/L (ref 39–117)
Bilirubin, Direct: 0.1 mg/dL (ref 0.0–0.3)
Total Bilirubin: 0.5 mg/dL (ref 0.2–1.2)
Total Protein: 6.1 g/dL (ref 6.0–8.3)

## 2019-03-11 LAB — HEMOGLOBIN A1C: Hgb A1c MFr Bld: 5.2 % (ref 4.6–6.5)

## 2019-03-11 NOTE — Patient Instructions (Signed)
Best of luck  with your surgery .  Will notify you  of labs when available.  Keep [place] informed .     Health Maintenance, Female Adopting a healthy lifestyle and getting preventive care are important in promoting health and wellness. Ask your health care provider about:  The right schedule for you to have regular tests and exams.  Things you can do on your own to prevent diseases and keep yourself healthy. What should I know about diet, weight, and exercise? Eat a healthy diet   Eat a diet that includes plenty of vegetables, fruits, low-fat dairy products, and lean protein.  Do not eat a lot of foods that are high in solid fats, added sugars, or sodium. Maintain a healthy weight Body mass index (BMI) is used to identify weight problems. It estimates body fat based on height and weight. Your health care provider can help determine your BMI and help you achieve or maintain a healthy weight. Get regular exercise Get regular exercise. This is one of the most important things you can do for your health. Most adults should:  Exercise for at least 150 minutes each week. The exercise should increase your heart rate and make you sweat (moderate-intensity exercise).  Do strengthening exercises at least twice a week. This is in addition to the moderate-intensity exercise.  Spend less time sitting. Even light physical activity can be beneficial. Watch cholesterol and blood lipids Have your blood tested for lipids and cholesterol at 26 years of age, then have this test every 5 years. Have your cholesterol levels checked more often if:  Your lipid or cholesterol levels are high.  You are older than 26 years of age.  You are at high risk for heart disease. What should I know about cancer screening? Depending on your health history and family history, you may need to have cancer screening at various ages. This may include screening for:  Breast cancer.  Cervical cancer.  Colorectal  cancer.  Skin cancer.  Lung cancer. What should I know about heart disease, diabetes, and high blood pressure? Blood pressure and heart disease  High blood pressure causes heart disease and increases the risk of stroke. This is more likely to develop in people who have high blood pressure readings, are of African descent, or are overweight.  Have your blood pressure checked: ? Every 3-5 years if you are 25-87 years of age. ? Every year if you are 68 years old or older. Diabetes Have regular diabetes screenings. This checks your fasting blood sugar level. Have the screening done:  Once every three years after age 9 if you are at a normal weight and have a low risk for diabetes.  More often and at a younger age if you are overweight or have a high risk for diabetes. What should I know about preventing infection? Hepatitis B If you have a higher risk for hepatitis B, you should be screened for this virus. Talk with your health care provider to find out if you are at risk for hepatitis B infection. Hepatitis C Testing is recommended for:  Everyone born from [birth year] through 1963.  Anyone with known risk factors for hepatitis C. Sexually transmitted infections (STIs)  Get screened for STIs, including gonorrhea and chlamydia, if: ? You are sexually active and are younger than 26 years of age. ? You are older than 26 years of age and your health care provider tells you that you are at risk for this type of infection. ?  Your sexual activity has changed since you were last screened, and you are at increased risk for chlamydia or gonorrhea. Ask your health care provider if you are at risk.  Ask your health care provider about whether you are at high risk for HIV. Your health care provider may recommend a prescription medicine to help prevent HIV infection. If you choose to take medicine to prevent HIV, you should first get tested for HIV. You should then be tested every 3 months for as long as  you are taking the medicine. Pregnancy  If you are about to stop having your period (premenopausal) and you may become pregnant, seek counseling before you get pregnant.  Take 400 to 800 micrograms (mcg) of folic acid every day if you become pregnant.  Ask for birth control (contraception) if you want to prevent pregnancy. Osteoporosis and menopause Osteoporosis is a disease in which the bones lose minerals and strength with aging. This can result in bone fractures. If you are 40 years old or older, or if you are at risk for osteoporosis and fractures, ask your health care provider if you should:  Be screened for bone loss.  Take a calcium or vitamin D supplement to lower your risk of fractures.  Be given hormone replacement therapy (HRT) to treat symptoms of menopause. Follow these instructions at home: Lifestyle  Do not use any products that contain nicotine or tobacco, such as cigarettes, e-cigarettes, and chewing tobacco. If you need help quitting, ask your health care provider.  Do not use street drugs.  Do not share needles.  Ask your health care provider for help if you need support or information about quitting drugs. Alcohol use  Do not drink alcohol if: ? Your health care provider tells you not to drink. ? You are pregnant, may be pregnant, or are planning to become pregnant.  If you drink alcohol: ? Limit how much you use to 0-1 drink a day. ? Limit intake if you are breastfeeding.  Be aware of how much alcohol is in your drink. In the [place] one drink equals one 12 oz bottle of beer (355 mL), one 5 oz glass of wine (148 mL), or one 1 oz glass of hard liquor (44 mL). General instructions  Schedule regular health, dental, and eye exams.  Stay current with your vaccines.  Tell your health care provider if: ? You often feel depressed. ? You have ever been abused or do not feel safe at home. Summary  Adopting a healthy lifestyle and getting preventive care are  important in promoting health and wellness.  Follow your health care provider's instructions about healthy diet, exercising, and getting tested or screened for diseases.  Follow your health care provider's instructions on monitoring your cholesterol and blood pressure. This information is not intended to replace advice given to you by your health care provider. Make sure you discuss any questions you have with your health care provider. Document Released: 12/16/2010 Document Revised: 05/26/2018 Document Reviewed: 05/26/2018 Elsevier Patient Education  2020 [employer].

## 2019-03-15 DIAGNOSIS — F332 Major depressive disorder, recurrent severe without psychotic features: Secondary | ICD-10-CM | POA: Diagnosis not present

## 2019-03-15 DIAGNOSIS — F314 Bipolar disorder, current episode depressed, severe, without psychotic features: Secondary | ICD-10-CM | POA: Diagnosis not present

## 2019-03-16 ENCOUNTER — Ambulatory Visit (INDEPENDENT_AMBULATORY_CARE_PROVIDER_SITE_OTHER): Payer: 59 | Admitting: Professional

## 2019-03-16 DIAGNOSIS — F509 Eating disorder, unspecified: Secondary | ICD-10-CM

## 2019-03-17 ENCOUNTER — Other Ambulatory Visit: Payer: Self-pay

## 2019-03-17 ENCOUNTER — Ambulatory Visit: Payer: 59 | Admitting: Cardiology

## 2019-03-17 ENCOUNTER — Encounter: Payer: Self-pay | Admitting: Cardiology

## 2019-03-17 VITALS — BP 124/79 | HR 89 | Temp 98.2°F | Ht 63.0 in | Wt 311.0 lb

## 2019-03-17 DIAGNOSIS — Z01810 Encounter for preprocedural cardiovascular examination: Secondary | ICD-10-CM | POA: Diagnosis not present

## 2019-03-17 DIAGNOSIS — K7581 Nonalcoholic steatohepatitis (NASH): Secondary | ICD-10-CM

## 2019-03-17 DIAGNOSIS — R000 Tachycardia, unspecified: Secondary | ICD-10-CM | POA: Diagnosis not present

## 2019-03-17 NOTE — Progress Notes (Signed)
Primary Physician:  Burnis Medin, MD   Patient ID: Linda Becker, female    DOB: [birth year], 28 y.o.   MRN: [id number]  Subjective:    Chief Complaint  Patient presents with  . Medical Clearance  . Tachycardia    HPI: Linda Becker  is a 28 y.o. female  with migraines, hypothyroidism, and history of chronic palpitations, last seen by [place] in April 2019, now presents for preoperative cardiac evaluation and elevated heart rate.  30 day event monitor was without any arrhythmias in both 2016 and 2019. Symptoms correlated with NSR. Echocardiogram showed mild LVH, but otherwise normal echo. 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  Unfortunately, she miscarried at 88 weeks she reports due to cholecystitis. She has been found to have NASH/portal hypertension with small esophageal varices, splenomegaly and thrombocytopenia. Being followed by GI and liver clinic. Weight loss has been stressed. She is planning to undergo gastric sleeve procedure with Dr. Jens Som.  She reports that she is overall doing well. She does report 1 episode of tachycardia approximately 1 month ago that was noted on her apple watch that lasted for a few hours. Resolved with drinking fluids. She has not had recurrence since. No chest pain or shortness of breath. Her blood pressure has been stable.   Past Medical History:  Diagnosis Date  . Allergic rhinitis    remote hx of allergy shots  . Asthma    since childhood  on controller meds extrinsic dr Carmelina Peal  . Gallbladder sludge   . Gastropathy   . GERD (gastroesophageal reflux disease)    on nexium  for  long term sx since childhood  . H/O miscarriage, not currently pregnant    [redacted] weeks  march 2016  . Hepatic steatosis   . Hypothyroidism   . Migraine   . Murmur    pt reports MVP  . Sessile colonic polyp   . Splenomegaly   . Steatohepatitis   . Syncope    under eval ? cause   . Tachycardia    episodes with near syncope eval dr Nadyne Coombes  on no meds dced LABA    Past Surgical History:  Procedure Laterality Date  . BIOPSY  09/03/2018   Procedure: BIOPSY;  Surgeon: Jerene Bears, MD;  Location: [facility];  Service: Gastroenterology;;  . broken right femur  2008   rod placed  . COLONOSCOPY WITH PROPOFOL N/A 09/03/2018   Procedure: COLONOSCOPY WITH PROPOFOL;  Surgeon: Jerene Bears, MD;  Location: [facility];  Service: Gastroenterology;  Laterality: N/A;  . DILATION AND CURETTAGE OF UTERUS    . ESOPHAGOGASTRODUODENOSCOPY (EGD) WITH PROPOFOL N/A 09/03/2018   Procedure: ESOPHAGOGASTRODUODENOSCOPY (EGD) WITH PROPOFOL;  Surgeon: Jerene Bears, MD;  Location: [facility];  Service: Gastroenterology;  Laterality: N/A;  . IR TRANSCATHETER BX  01/19/2019  . IR US GUIDE VASC ACCESS RIGHT  01/19/2019  . IR VENOGRAM HEPATIC W HEMODYNAMIC EVALUATION  01/19/2019  . OB ultrasound N/A 12/01/2017   see report  . POLYPECTOMY  09/03/2018   Procedure: POLYPECTOMY;  Surgeon: Jerene Bears, MD;  Location: [facility];  Service: Gastroenterology;;  . Jacques Earthly  . WISDOM TOOTH EXTRACTION      Social History   Socioeconomic History  . Marital status: Single    Spouse name: Not on file  . Number of children: 1  . Years of education: Not on file  .  Highest education level: Not on file  Occupational History  . Not on file  Social Needs  . Financial resource strain: Not on file  . Food insecurity    Worry: Not on file    Inability: Not on file  . Transportation needs    Medical: Not on file    Non-medical: Not on file  Tobacco Use  . Smoking status: Never Smoker  . Smokeless tobacco: Never Used  Substance and Sexual Activity  . Alcohol use: No    Alcohol/week: 0.0 standard drinks    Frequency: Never  . Drug use: No  . Sexual activity: Yes    Birth control/protection: None  Lifestyle  . Physical activity    Days per week: Not on file    Minutes per session: Not  on file  . Stress: Not on file  Relationships  . Social Herbalist on phone: Not on file    Gets together: Not on file    Attends religious service: Not on file    Active member of club or organization: Not on file    Attends meetings of clubs or organizations: Not on file    Relationship status: Not on file  . Intimate partner violence    Fear of current or ex partner: Not on file    Emotionally abused: Not on file    Physically abused: Not on file    Forced sexual activity: Not on file  Other Topics Concern  . 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  Eyes: Negative for visual disturbance.  Cardiovascular: Positive for palpitations. Negative for chest pain, claudication, dyspnea on exertion, leg swelling, orthopnea and syncope.  Respiratory: Negative for hemoptysis and wheezing.   Endocrine: Negative for cold intolerance and heat intolerance.  Hematologic/Lymphatic: Does not bruise/bleed easily.  Skin: Negative for nail changes.  Musculoskeletal: Negative for muscle weakness and myalgias.  Gastrointestinal: Negative for abdominal pain, change in bowel habit, nausea and vomiting.  Neurological: Negative for difficulty with concentration, dizziness, focal weakness and headaches.  Psychiatric/Behavioral: Negative for altered mental status and suicidal ideas.  All other systems reviewed and are negative.     Objective:  Blood pressure 124/79, pulse 89, temperature 98.2 F (36.8 C), height 5' 3"  (1.6 m), weight (!) 311 lb (141.1 kg), last menstrual period 03/04/2019, SpO2 99 %. Body mass index is 55.09 kg/m.    Physical Exam   Constitutional: She is oriented to person, place, and time. Vital signs are normal. She appears well-developed and well-nourished.  Morbidly obese  HENT:  Head: Normocephalic and atraumatic.  Neck: Normal range of motion.  Cardiovascular: Normal rate, regular rhythm, normal heart sounds and intact distal pulses.  Pulmonary/Chest: Effort normal and breath sounds normal. No accessory muscle usage. No respiratory distress.  Abdominal: Soft. Bowel sounds are normal.  Musculoskeletal: Normal range of motion.  Neurological: She is alert and oriented to person, place, and time.  Skin: Skin is warm and dry.  Vitals reviewed.  Radiology: No results found.  Laboratory examination:  CMP Latest Ref Rng & Units 03/11/2019 01/19/2019 11/26/2018  Glucose 70 - 99 mg/dL 88 106(H) 118(H)  BUN 6 - 23 mg/dL 8 9 7   Creatinine 0.40 - 1.20 mg/dL 1.00 0.90 0.92  Sodium 135 - 145 mEq/L 143 142 141  Potassium 3.5 - 5.1 mEq/L 4.4 4.3 4.3  Chloride 96 - 112 mEq/L 109 114(H) 109  CO2 19 - 32 mEq/L 25 23 25   Calcium 8.4 - 10.5 mg/dL 9.2 8.6(L) 8.8  Total Protein 6.0 - 8.3 g/dL 6.1 6.5 6.2  Total Bilirubin 0.2 - 1.2 mg/dL 0.5 0.6 0.6  Alkaline Phos 39 - 117 U/L 116 125 126(H)  AST 0 - 37 U/L 44(H) 61(H) 62(H)  ALT 0 - 35 U/L 58(H) 66(H) 72(H)   CBC Latest Ref Rng & Units 03/11/2019 01/19/2019 11/26/2018  WBC 4.0 - 10.5 K/uL 6.3 7.7 8.8  Hemoglobin 12.0 - 15.0 g/dL 14.1 14.0 13.4  Hematocrit 36.0 - 46.0 % 44.0 44.2 41.1  Platelets 150.0 - 400.0 K/uL 81.0 Repeated and verified X2.(L) 83(L) 86.0(L)   Lipid Panel     Component Value Date/Time   CHOL 184 03/11/2019 0956   TRIG 99.0 03/11/2019 0956   HDL 41.80 03/11/2019 0956   CHOLHDL 4 03/11/2019 0956   VLDL 19.8 03/11/2019 0956   LDLCALC 122 (H) 03/11/2019 0956   HEMOGLOBIN A1C Lab Results  Component Value Date   HGBA1C 5.2 03/11/2019   TSH Recent Labs    05/26/18 1542 03/11/19 0956  TSH 3.53 1.59    PRN Meds:. There are no discontinued  medications. Current Meds  Medication Sig  . acetaminophen (TYLENOL) 500 MG tablet Take 500 mg by mouth every 6 (six) hours as needed for moderate pain or headache.  . albuterol (PROVENTIL) (2.5 MG/3ML) 0.083% nebulizer solution Use 1 vial nebulized every 4-6 hours as needed for cough, wheeze, shortness of breath or chest tightness (Patient taking differently: Take 2.5 mg by nebulization every 4 (four) hours as needed for wheezing or shortness of breath. )  . albuterol (VENTOLIN HFA) 108 (90 Base) MCG/ACT inhaler Inhale 2 puffs into the lungs every 4 (four) hours as needed for wheezing or shortness of breath.  . Beclomethasone Dipropionate (QNASL) 80 MCG/ACT AERS INSTILL 1 SPRAY IN EACH NOSTRIL ONCE DAILY AS DIRECTED  . cetirizine (ZYRTEC) 5 MG tablet Take 5 mg by mouth 2 (two) times daily.  . Cholecalciferol (VITAMIN D) 50 MCG (2000 UT) tablet Take 4,000 Units by mouth daily.  Marland Kitchen dicyclomine (BENTYL) 20 MG tablet Take 10 mg by mouth every 4-6 hours as needed. (Patient taking differently: Take 20 mg by mouth 2 (two) times daily. )  . diphenoxylate-atropine (LOMOTIL) 2.5-0.025 MG tablet TAKE 1 TABLET BY MOUTH FOUR TIMES DAILY AS NEEDED FOR DIARRHEA  . Dupilumab (DUPIXENT) 300 MG/2ML SOSY Inject 300 mg into the skin every 14 (fourteen) days.  Marland Kitchen esomeprazole (NEXIUM) 40 MG capsule TAKE 1 CAPSULE BY MOUTH TWO TIMES DAILY BEFORE A MEAL  . gabapentin (NEURONTIN) 100 MG capsule Take 200 mg by mouth every 4 (four) hours as needed.   . gabapentin (NEURONTIN) 300 MG capsule Take 300 mg by mouth at bedtime.   . lamoTRIgine (LAMICTAL) 150 MG tablet Take 150 mg by mouth daily.  Marland Kitchen levothyroxine (SYNTHROID) 88 MCG tablet TAKE 1 TABLET BY MOUTH DAILY.  Marland Kitchen loperamide (IMODIUM) 2 MG capsule Take 2 mg by mouth as needed for diarrhea or loose stools.  Marland Kitchen LORazepam (ATIVAN) 0.5 MG tablet Take 0.25-0.5 mg by mouth every  6 (six) hours as needed for anxiety.  . montelukast (SINGULAIR) 10 MG tablet TAKE 1 TABLET BY MOUTH  DAILY.  Marland Kitchen olopatadine (PATANOL) 0.1 % ophthalmic solution Use one drop in each eye twice daily if needed (Patient taking differently: Place 1 drop into both eyes 2 (two) times daily as needed for allergies. )  . ondansetron (ZOFRAN) 4 MG tablet Take 4 mg by mouth See admin instructions. Take 4 mg in the morning and may take an additional 4 mg as needed for nausea  . Prenatal Vit-Fe Fumarate-FA (PRENATAL FORMULA PO) Take 1 tablet by mouth daily.  . promethazine (PHENERGAN) 25 MG tablet Take 25-50 mg by mouth every 6 (six) hours as needed for nausea or vomiting.   . SYMBICORT 160-4.5 MCG/ACT inhaler INHALE 2 PUFFS BY MOUTH TWICE A DAY TO PREVENT COUGH OR WHEEZE. RINSE, GARGLE, AND SPIT AFTER USE. (Patient taking differently: Inhale 2 puffs into the lungs 2 (two) times daily. )  . vitamin E 200 UNIT capsule Take 400 Units by mouth daily.    Cardiac Studies:   Echocardiogram 07/21/2017: Left ventricle cavity is normal in size. Mild concentric hypertrophy of the left ventricle. Normal global wall motion. Visual EF is 60-65%. Normal diastolic filling pattern. No significant valvular abnormality No evidence of pulmonary hypertension. No significant change compared to prior study dated 04/12/2015.  Event Monitor 30 days 07/07/2017: NSR. Symptoms of fatigue and palpitations: NSR. No other significant arrhythmias noted.  Assessment:   Tachycardia - Plan: EKG 12-Lead  Preoperative cardiovascular examination  Morbid obesity ([place])  Liver cirrhosis secondary to NASH ([place])  EKG 03/17/2019: Normal sinus rhythm at 88 bpm, normal axis, no evidence of ischemia.   Recommendations:   Patient is overall doing well, she continues to have occasional episodes of palpitations and sinus tachycardia, but appear to be stable. She has worn event monitors in 2019 and 2016 without arrhythmias. Echocardiogram was also performed 1 year ago without any structural abnormalities. Weight loss is imperative for her in  view of portal hypertension and also decreasing her risk factors. I see no contraindication for her to proceed with gastric sleeve procedure and feel that she may proceed low risk for perioperative complications. She is without chest pain or shortness of breath. No hypertension. I will see her back on a PRN basis.    *I have discussed this case with Dr. Einar Gip and he participated in formulating the plan.*   Miquel Dunn, MSN, APRN, FNP-C [facility]. [place] Office: [phone] Fax: [phone]

## 2019-03-22 ENCOUNTER — Other Ambulatory Visit: Payer: Self-pay | Admitting: Allergy and Immunology

## 2019-03-22 ENCOUNTER — Ambulatory Visit (INDEPENDENT_AMBULATORY_CARE_PROVIDER_SITE_OTHER): Payer: 59 | Admitting: Professional

## 2019-03-22 DIAGNOSIS — F411 Generalized anxiety disorder: Secondary | ICD-10-CM | POA: Diagnosis not present

## 2019-03-24 MED FILL — TOPIRAMATE 50 MG TABLET: 50 | 30 days supply | Qty: 60 | Fill #1

## 2019-03-31 ENCOUNTER — Ambulatory Visit: Payer: 59 | Admitting: Professional

## 2019-04-05 ENCOUNTER — Encounter: Payer: 59 | Attending: Surgery | Admitting: Dietician

## 2019-04-05 ENCOUNTER — Other Ambulatory Visit: Payer: Self-pay

## 2019-04-05 ENCOUNTER — Encounter: Payer: Self-pay | Admitting: Dietician

## 2019-04-05 ENCOUNTER — Ambulatory Visit: Payer: 59 | Admitting: Professional

## 2019-04-05 DIAGNOSIS — E669 Obesity, unspecified: Secondary | ICD-10-CM | POA: Diagnosis not present

## 2019-04-05 MED FILL — GABAPENTIN 100 MG CAPSULE: 100 | 30 days supply | Qty: 180 | Fill #0

## 2019-04-05 MED FILL — ARIPiprazole 10 MG TABS: 10 | 30 days supply | Qty: 30 | Fill #0

## 2019-04-05 MED FILL — lamoTRIgine 150 MG TABS: 150 | 30 days supply | Qty: 30 | Fill #0

## 2019-04-05 MED FILL — TOPIRAMATE 50 MG TABLET: 50 | 30 days supply | Qty: 60 | Fill #0

## 2019-04-05 MED FILL — ONDANSETRON HCL 8 MG TABLET: 8 | 30 days supply | Qty: 30 | Fill #0

## 2019-04-05 NOTE — Progress Notes (Signed)
Nutrition Assessment for Bariatric Surgery Medical Nutrition Therapy  Appt Start Time: 9:55am    End Time: 10:40am  Patient was seen on 04/05/2019 for Pre-Operative Nutrition Assessment. Letter of approval faxed to [facility] bariatric surgery program coordinator on 10/21/ 2020.   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 Lives with her boyfriend. Seems motivated for surgery.   Typical meal pattern is 2-3 meals per day. 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 Eats out maybe 3 times per week.  If she drinks a non-water beverage, she tries to drink 8 oz of water with it.   24-Hr Dietary Recall First Meal: bagel + hot tea Snack: - Second Meal: chicken hibachi  Snack: - Third Meal: spaghetti  Snack: - Beverages: hot tea, sweet tea, lemonade, apple juice, water    Estimated Energy Needs Calories: 1800 Carbohydrate: 200g Protein: 113g Fat: 60g   NUTRITION DIAGNOSIS  Overweight/obesity ([place]-3.3) related to past poor dietary habits and physical inactivity as evidenced by patient w/ planned Sleeve Gastrectomy surgery following dietary guidelines for continued weight loss.    NUTRITION INTERVENTION  Nutrition counseling (C-1) and education (E-2) to facilitate bariatric surgery goals.  Pre-Op Goals Reviewed with the Patient . Track food and beverage intake (pen and paper, MyFitness Pal, Baritastic  app, etc.) . Make healthy food choices while monitoring portion sizes . Consume 3 meals per day or try to eat every 3-5 hours . Avoid concentrated sugars and fried foods . Keep sugar & fat in the single digits per serving on food labels . Practice CHEWING your food (aim for applesauce consistency) . Practice not drinking 15 minutes before, during, and 30 minutes after each meal and snack . Avoid all carbonated beverages (ex: soda, sparkling beverages)  . Limit caffeinated beverages (ex: coffee, tea, energy drinks) . Avoid all sugar-sweetened beverages (ex: regular soda, sports drinks)  . Avoid alcohol  . Aim for 64-100 ounces of FLUID daily (with at least half of fluid intake being plain water)  . Aim for at least 60-80 grams of PROTEIN daily . Look for a liquid protein source that contains ?15 g protein and ?5 g carbohydrate (ex: shakes, drinks, shots) . Make a list of non-food related activities . Physical activity is an important part of a healthy lifestyle so keep it moving! The goal is to reach 150 minutes of exercise per week, including cardiovascular and weight baring activity.  Handouts Provided Include  . Bariatric Surgery handouts (Nutrition Visits, Pre-Op Goals, Protein Shakes, Vitamins & Minerals)  Learning Style & Readiness for Change Teaching method utilized: Visual & Auditory  Demonstrated degree of understanding via: Teach Back  Barriers to learning/adherence to lifestyle change: None Identified    MONITORING & EVALUATION Dietary intake, weekly physical activity, body weight, and pre-op goals reached at next nutrition visit.   Next Steps Patient is to call [facility] to enroll in Pre-Op Class (>2 weeks before surgery) and Post-Op Class (2 weeks  after surgery) for further nutrition education when surgery date is scheduled.

## 2019-04-08 ENCOUNTER — Ambulatory Visit: Payer: 59 | Admitting: Professional

## 2019-04-12 ENCOUNTER — Ambulatory Visit: Payer: 59 | Admitting: Allergy and Immunology

## 2019-04-12 ENCOUNTER — Ambulatory Visit (INDEPENDENT_AMBULATORY_CARE_PROVIDER_SITE_OTHER): Payer: 59 | Admitting: Professional

## 2019-04-12 DIAGNOSIS — F411 Generalized anxiety disorder: Secondary | ICD-10-CM | POA: Diagnosis not present

## 2019-04-19 ENCOUNTER — Ambulatory Visit (INDEPENDENT_AMBULATORY_CARE_PROVIDER_SITE_OTHER): Payer: 59 | Admitting: Professional

## 2019-04-19 DIAGNOSIS — F411 Generalized anxiety disorder: Secondary | ICD-10-CM

## 2019-04-28 ENCOUNTER — Ambulatory Visit (INDEPENDENT_AMBULATORY_CARE_PROVIDER_SITE_OTHER): Payer: 59 | Admitting: Professional

## 2019-04-29 ENCOUNTER — Other Ambulatory Visit: Payer: Self-pay | Admitting: Allergy and Immunology

## 2019-04-29 ENCOUNTER — Other Ambulatory Visit: Payer: Self-pay | Admitting: Internal Medicine

## 2019-04-29 MED FILL — ARIPiprazole 10 MG TABS: 10 | 30 days supply | Qty: 30 | Fill #0

## 2019-04-29 MED FILL — TOPIRAMATE 50 MG TABLET: 50 | 30 days supply | Qty: 60 | Fill #0

## 2019-04-29 MED FILL — QNASL 80 MCG/ACT AERS: 80 | 30 days supply | Qty: 11 | Fill #2

## 2019-04-29 MED FILL — GABAPENTIN 100 MG CAPSULE: 100 | 30 days supply | Qty: 180 | Fill #0

## 2019-04-29 MED FILL — DIPHENOXYLATE-ATROPINE 2.5-: 2.5-0.025 | 15 days supply | Qty: 60 | Fill #0

## 2019-04-29 MED FILL — lamoTRIgine 150 MG TABS: 150 | 30 days supply | Qty: 30 | Fill #0

## 2019-04-29 MED FILL — ONDANSETRON HCL 8 MG TABLET: 8 | 30 days supply | Qty: 30 | Fill #0

## 2019-04-29 MED FILL — LORazepam 0.5 MG TABS: 0.5 | 30 days supply | Qty: 15 | Fill #0

## 2019-04-29 MED FILL — DUPIXENT 300 MG/2 ML SAFE S: 300 | 28 days supply | Qty: 4 | Fill #3

## 2019-04-29 MED FILL — GABAPENTIN 300 MG CAPSULE: 300 | 30 days supply | Qty: 30 | Fill #0

## 2019-05-06 ENCOUNTER — Ambulatory Visit (INDEPENDENT_AMBULATORY_CARE_PROVIDER_SITE_OTHER): Payer: 59 | Admitting: Professional

## 2019-05-06 DIAGNOSIS — F411 Generalized anxiety disorder: Secondary | ICD-10-CM

## 2019-05-20 ENCOUNTER — Ambulatory Visit: Payer: 59 | Admitting: Professional

## 2019-05-24 ENCOUNTER — Ambulatory Visit: Payer: 59 | Admitting: Allergy and Immunology

## 2019-05-26 ENCOUNTER — Ambulatory Visit: Payer: 59 | Admitting: Professional

## 2019-05-27 ENCOUNTER — Ambulatory Visit: Payer: 59 | Admitting: Professional

## 2019-05-27 MED FILL — GABAPENTIN 100 MG CAPSULE: 100 | 30 days supply | Qty: 180 | Fill #1

## 2019-05-27 MED FILL — ONDANSETRON HCL 8 MG TABLET: 8 | 30 days supply | Qty: 30 | Fill #1

## 2019-05-27 MED FILL — DUPIXENT 300 MG/2 ML SAFE S: 300 | 28 days supply | Qty: 4 | Fill #4

## 2019-05-27 MED FILL — ESOMEPRAZOLE MAG DR 40 MG C: 40 | 30 days supply | Qty: 60 | Fill #4

## 2019-05-30 ENCOUNTER — Other Ambulatory Visit: Payer: Self-pay

## 2019-05-30 ENCOUNTER — Other Ambulatory Visit: Payer: Self-pay | Admitting: Allergy and Immunology

## 2019-05-30 MED ORDER — ALBUTEROL SULFATE HFA 108 (90 BASE) MCG/ACT IN AERS: 2.0000 | INHALATION_SPRAY | RESPIRATORY_TRACT | 0 refills | Status: DC | PRN

## 2019-05-30 MED FILL — ALBUTEROL SULFATE HFA 108 (: 108 (90 BAS | 17 days supply | Qty: 18 | Fill #0

## 2019-05-30 NOTE — Telephone Encounter (Signed)
Patient called requesting a refill for her albuterol inhaler. She made an appointment to see Dr. Neldon Mc tomorrow, 05/31/19; she is also having an allergic reaction on her face that keeps getting worse. [place].

## 2019-05-30 NOTE — Telephone Encounter (Signed)
Spoke with patient. Patient made aware that Albuterol HFA #1 was sent to [facility]. Patient verbalized understanding.

## 2019-05-31 ENCOUNTER — Ambulatory Visit: Payer: 59 | Admitting: Allergy and Immunology

## 2019-05-31 ENCOUNTER — Ambulatory Visit: Payer: 59 | Admitting: Professional

## 2019-06-01 ENCOUNTER — Telehealth: Payer: Self-pay | Admitting: *Deleted

## 2019-06-01 NOTE — Telephone Encounter (Signed)
L/M for patient to contact office to make MD appt for reapproval for Dupxient that expires 06/27/19

## 2019-06-07 DIAGNOSIS — F3181 Bipolar II disorder: Secondary | ICD-10-CM | POA: Diagnosis not present

## 2019-06-07 DIAGNOSIS — F408 Other phobic anxiety disorders: Secondary | ICD-10-CM | POA: Diagnosis not present

## 2019-06-07 MED FILL — ARIPiprazole 10 MG TABS: 10 | 30 days supply | Qty: 30 | Fill #0

## 2019-06-07 MED FILL — TOPIRAMATE 50 MG TABLET: 50 | 30 days supply | Qty: 60 | Fill #0

## 2019-06-07 MED FILL — buPROPion HCL ER (XL) 150 M: 150 | 30 days supply | Qty: 60 | Fill #0

## 2019-06-07 MED FILL — lamoTRIgine 150 MG TABS: 150 | 30 days supply | Qty: 30 | Fill #0

## 2019-06-16 MED FILL — LORazepam 0.5 MG TABS: 0.5 | 30 days supply | Qty: 15 | Fill #1

## 2019-06-20 ENCOUNTER — Other Ambulatory Visit: Payer: Self-pay

## 2019-06-20 ENCOUNTER — Encounter: Payer: 59 | Attending: Surgery | Admitting: Skilled Nursing Facility1

## 2019-06-20 DIAGNOSIS — E669 Obesity, unspecified: Secondary | ICD-10-CM | POA: Insufficient documentation

## 2019-06-20 NOTE — Progress Notes (Signed)
Pre-Operative Nutrition Class:  Appt start time: 1103   End time:  1830.  Patient was seen on 06/20/2019 for Pre-Operative Bariatric Surgery Education at [facility].   Surgery date: 06/28/2019 Surgery type: sleeve Start weight at [facility]: 309.5 Weight today: 309.6   The following the learning objectives were met by the patient during this course:  Identify Pre-Op Dietary Goals and will begin 2 weeks pre-operatively  Identify appropriate sources of fluids and proteins   State protein recommendations and appropriate sources pre and post-operatively  Identify Post-Operative Dietary Goals and will follow for 2 weeks post-operatively  Identify appropriate multivitamin and calcium sources  Describe the need for physical activity post-operatively and will follow MD recommendations  State when to call healthcare provider regarding medication questions or post-operative complications  Handouts given during class include:  Pre-Op Bariatric Surgery Diet Handout  Protein Shake Handout  Post-Op Bariatric Surgery Nutrition Handout  BELT Program Information Flyer  Support Group Information Flyer  [facility] Bariatric Supplements Price List  Follow-Up Plan: Patient will follow-up at [facility] 2 weeks post operatively for diet advancement per MD.

## 2019-06-21 SURGERY — Surgical Case
Anesthesia: *Unknown

## 2019-06-22 ENCOUNTER — Ambulatory Visit: Payer: Self-pay | Admitting: Surgery

## 2019-06-22 MED FILL — DUPIXENT 300 MG/2 ML SAFE S: 300 | 28 days supply | Qty: 4 | Fill #0

## 2019-06-22 NOTE — Patient Instructions (Addendum)
DUE TO COVID-19 ONLY ONE VISITOR IS ALLOWED TO COME WITH YOU AND STAY IN THE WAITING ROOM ONLY DURING PRE OP AND PROCEDURE DAY OF SURGERY. THE 1 VISITOR MAY VISIT WITH YOU AFTER SURGERY IN YOUR PRIVATE ROOM DURING VISITING HOURS ONLY!  YOU NEED TO HAVE A COVID 19 TEST ON_1/8______ @_1 :35______, THIS TEST MUST BE DONE BEFORE SURGERY, COME  [place] , [id number].  ([place]) ONCE YOUR COVID TEST IS COMPLETED, PLEASE BEGIN THE QUARANTINE INSTRUCTIONS AS OUTLINED IN YOUR HANDOUT.                Linda Becker    Your procedure is scheduled on: 06/28/19   Report to [facility] Main  Entrance   Report to admitting at 7:45 AM     Call this number if you have problems the morning of surgery [phone]    Remember: Do not eat food after Midnight.    BRUSH YOUR TEETH MORNING OF SURGERY AND RINSE YOUR MOUTH OUT, NO CHEWING GUM CANDY OR MINTS.   CLEAR LIQUID UNTIL 6:45 AM  CLEAR LIQUID DIET   Foods Allowed                                                                     Foods Excluded  Coffee and tea, regular and decaf                             liquids that you cannot  Plain Jell-O any favor except red or purple                                           see through such as: Fruit ices (not with fruit pulp)                                     milk, soups, orange juice  Iced Popsicles                                    All solid food Carbonated beverages, regular and diet                                    Cranberry, grape and apple juices Sports drinks like Gatorade Lightly seasoned clear broth or consume(fat free) Sugar, honey syrup      Take these medicines the morning of surgery with A SIP OF WATER:  Lamictal, Prevacid, Zyrtec,Topamax, Qnasl  Use your inhalers and bring them with you to the hospital                                 You may not have any metal on your body including hair pins and  piercings  Do  not wear jewelry, make-up, lotions, powders or perfumes, deodorant             Do not wear nail polish on your fingernails.  Do not shave  48 hours prior to surgery.              Do not bring valuables to the hospital. [place].  Contacts, dentures or bridgework may not be worn into surgery.    PAIN IS EXPECTED AFTER SURGERY AND WILL NOT BE COMPLETELY ELIMINATED. AMBULATION AND TYLENOL WILL HELP REDUCE INCISIONAL AND GAS PAIN. MOVEMENT IS KEY!  YOU ARE EXPECTED TO BE OUT OF BED WITHIN 4 HOURS OF ADMISSION TO YOUR PATIENT ROOM.  SITTING IN THE RECLINER THROUGHOUT THE DAY IS IMPORTANT FOR DRINKING FLUIDS AND MOVING GAS THROUGHOUT THE GI TRACT.  COMPRESSION STOCKINGS SHOULD BE WORN [place] UNLESS YOU ARE WALKING.   INCENTIVE SPIROMETER SHOULD BE USED EVERY HOUR WHILE AWAKE TO DECREASE POST-OPERATIVE COMPLICATIONS SUCH AS PNEUMONIA.  WHEN DISCHARGED HOME, IT IS IMPORTANT TO CONTINUE TO WALK EVERY HOUR AND USE THE INCENTIVE SPIROMETER EVERY HOUR.             Special Instructions: N/A              Please read over the following fact sheets you were given: _____________________________________________________________________             [facility] - Preparing for Surgery  Before surgery, you can play an important role.   Because skin is not sterile, your skin needs to be as free of germs as possible.   You can reduce the number of germs on your skin by washing with CHG (chlorahexidine gluconate) soap before surgery.   CHG is an antiseptic cleaner which kills germs and bonds with the skin to continue killing germs even after washing. Please DO NOT use if you have an allergy to CHG or antibacterial soaps.   If your skin becomes reddened/irritated stop using the CHG and inform your nurse when you arrive at Short Stay. Do not shave (including legs and underarms) for at least 48 hours prior to the first CHG shower.    Please follow these instructions carefully:  1.  Shower with CHG Soap the night before surgery and the  morning of Surgery.  2.  If you choose to wash your hair, wash your hair first as usual with your  normal  shampoo.  3.  After you shampoo, rinse your hair and body thoroughly to remove the  shampoo.                                        4.  Use CHG as you would any other liquid soap.  You can apply chg directly  to the skin and wash                       Gently with a scrungie or clean washcloth.  5.  Apply the CHG Soap to your body ONLY FROM THE NECK DOWN.   Do not use on face/ open                           Wound or open sores.  Avoid contact with eyes, ears mouth and genitals (private parts).                       Wash face,  Genitals (private parts) with your normal soap.             6.  Wash thoroughly, paying special attention to the area where your surgery  will be performed.  7.  Thoroughly rinse your body with warm water from the neck down.  8.  DO NOT shower/wash with your normal soap after using and rinsing off  the CHG Soap.             9.  Pat yourself dry with a clean towel.            10.  Wear clean pajamas.            11.  Place clean sheets on your bed the night of your first shower and do not  sleep with pets. Day of Surgery : Do not apply any lotions/deodorants the morning of surgery.  Please wear clean clothes to the hospital/surgery center.  FAILURE TO FOLLOW THESE INSTRUCTIONS MAY RESULT IN THE CANCELLATION OF YOUR SURGERY PATIENT SIGNATURE_________________________________  NURSE SIGNATURE__________________________________  ________________________________________________________________________   Adam Phenix  An incentive spirometer is a tool that can help keep your lungs clear and active. This tool measures how well you are filling your lungs with each breath. Taking long deep breaths may help reverse or decrease the chance of developing breathing  (pulmonary) problems (especially infection) following:  A long period of time when you are unable to move or be active. BEFORE THE PROCEDURE   If the spirometer includes an indicator to show your best effort, your nurse or respiratory therapist will set it to a desired goal.  If possible, sit up straight or lean slightly forward. Try not to slouch.  Hold the incentive spirometer in an upright position. INSTRUCTIONS FOR USE  1. Sit on the edge of your bed if possible, or sit up as far as you can in bed or on a chair. 2. Hold the incentive spirometer in an upright position. 3. Breathe out normally. 4. Place the mouthpiece in your mouth and seal your lips tightly around it. 5. Breathe in slowly and as deeply as possible, raising the piston or the ball toward the top of the column. 6. Hold your breath for 3-5 seconds or for as long as possible. Allow the piston or ball to fall to the bottom of the column. 7. Remove the mouthpiece from your mouth and breathe out normally. 8. Rest for a few seconds and repeat Steps 1 through 7 at least 10 times every 1-2 hours when you are awake. Take your time and take a few normal breaths between deep breaths. 9. The spirometer may include an indicator to show your best effort. Use the indicator as a goal to work toward during each repetition. 10. After each set of 10 deep breaths, practice coughing to be sure your lungs are clear. If you have an incision (the cut made at the time of surgery), support your incision when coughing by placing a pillow or rolled up towels firmly against it. Once you are able to get out of bed, walk around indoors and cough well. You may stop using the incentive spirometer when instructed by your caregiver.  RISKS AND COMPLICATIONS  Take your time so you do not get dizzy or light-headed.  If you are  in pain, you may need to take or ask for pain medication before doing incentive spirometry. It is harder to take a deep breath if you  are having pain. AFTER USE  Rest and breathe slowly and easily.  It can be helpful to keep track of a log of your progress. Your caregiver can provide you with a simple table to help with this. If you are using the spirometer at home, follow these instructions: [place] IF:   You are having difficultly using the spirometer.  You have trouble using the spirometer as often as instructed.  Your pain medication is not giving enough relief while using the spirometer.  You develop fever of 100.5 F (38.1 C) or higher. SEEK IMMEDIATE MEDICAL CARE IF:   You cough up bloody sputum that had not been present before.  You develop fever of 102 F (38.9 C) or greater.  You develop worsening pain at or near the incision site. MAKE SURE YOU:   Understand these instructions.  Will watch your condition.  Will get help right away if you are not doing well or get worse. Document Released: 10/13/2006 Document Revised: 08/25/2011 Document Reviewed: 12/14/2006 [facility] Patient Information 2014 [place], [place].   ________________________________________________________________________

## 2019-06-22 NOTE — H&P (View-Only) (Signed)
Surgical H&P Requesting provider: Dr. [name]  CC: morbid obesity  HPI: Follows up for further discussion of surgical management of obesity.  She has been doing well and has learned a lot from meeting with the dietitians and continues to follow up with the therapist that she met for her bariatric evaluation.  She has made minimal progress with weight loss.  Her last visit with the liver care clinic was in August.  She has subsequently had a transjugular biopsy confirming portal hypertension and with a biopsy now confirming F3/4 fibrosis.  Last note from [facility] noted in the media section of Epic- and I went over this with the patient though she was already aware- given her current labs and liver function, she is quoted a 1% seven-day mortality rate, 5% 30 day mortality and 8% 90 day mortality risk.  We also discussed that she is at higher risk of intraoperative bleeding, and there is a possibility of needing to abort the procedure.  She is aware of and accepting of all of this, but continues to feel that proceeding with sleeve gastrectomy is the right decision for her. Given anticipated weight loss, I am in agreement there is benefit to considering this procedure.   Transjugular hepatic venogram: FINDINGS: Normal right hepatic venogram.  Pressures:  Wedge portal venous  17/14 (15) mmHg  Right hepatic vein 8/4 (6) mmHg  Right atrium 4/0 (2) mmHg   IMPRESSION: 1. Right hepatic venogram confirms appropriate catheter position. 2. Elevated hepatic venous pressure gradient of 9 mmHg. 3. Technically successful transjugular core liver biopsy .  CXR/UGI 03/04/19- normal Cardiac Clearance 03/17/19- Miquel Dunn NP Nutriionist- Lexine Baton Short  Initial visit 02/18/19: 29yo woman referred by Dr. [name] for consideration of bariatric surgery.  She has a history of NASH with portal hypertension, esophageal varices, splenomegaly, and mild thrombocytopenia (80s), GERD (well controlled on nexium), morbid  obesity, colonic polyps, hyperlipidemia, hypothyroidism (endocrinologist is Dr. Dwyane Dee).  She has a history of irregular tachycardia and sees Dr. Einar Gip.  Also has a history of irritable bowel syndrome with diarrhea on multiple agents.  Follows with Dr. [name]. Also has a history of psychiatric disease and is on Topamax and lithium, Per EPIC History of significant asthma on albuterol, beclomethasone, dupilumab, Symbicort..  Her primary motivations for pursuing weight loss surgery are to improve her overall health and specifically her liver disease, and to be able to carry a pregnancy.  Her liver problems actually began with pregnancy last year, she states that prior to this she did not have any known over dysfunction and on review of her labs, it seems that they were grossly normal prior to 2019 including normal platelets.  She states that she had "blocked bile ducts"-review of chart makes note of cholestasis of pregnancy.  Unfortunately she had intrauterine fetal demise at 56 weeks. 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  Small hiatal hernia.  Colonoscopy in March with 2 polyps in the descending colon, 2 polyps in the transverse colon and one polyp in the rectum, otherwise normal  CMP and CBC last: Normal sodium, Demonstrates AST/ALT in the 60s, normal bilirubin, normal albumin.  Platelets 83, INR 1.1.  CT scan last year shows the spleen enlarged to 15 cm.  Portal venogram last month confirms portal hypertension.   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  Also  in college.  Denies alcohol tobacco or drug use.  She endorses weakness for sweet tea, and because of her very busy work and school schedule, she eats a lot of "fast food salads" and cafeteria food.  Fam Hx: Significant family history of obesity.  Her father had a lap band but did  not have good results due to poor follow-up.  Arthritis, hyperlipidemia, heart disease, hypertension, diabetes, kidney and liver disease, bleeding disorder and colon polyps in her mother, all of the above and her father as well as asthma, history of breast cancer in both grandmothers, pancreatic cancer in maternal grandfather, diabetes in 3 out of 4 grandparents, liver, stomach and colon cancer in paternal grandfather, breast cancer in aunts on both sides and esophageal cancer in a paternal aunt.  Allergies  Allergen Reactions   Progesterone Rash    Was in a form of birth control.   Penicillins Rash    Did it involve swelling of the face/tongue/throat, SOB, or low BP? No Did it involve sudden or severe rash/hives, skin peeling, or any reaction on the inside of your mouth or nose? Yes Did you need to seek medical attention at a hospital or doctor's office? No When did it last happen?      9 + months If all above answers are "NO", may proceed with cephalosporin use.      Past Medical History:  Diagnosis Date   Allergic rhinitis    remote hx of allergy shots   Asthma    since childhood  on controller meds extrinsic dr Carmelina Peal   Gallbladder sludge    Gastropathy    GERD (gastroesophageal reflux disease)    on nexium  for  long term sx since childhood   H/O miscarriage, not currently pregnant    [redacted] weeks  march 2016   Hepatic steatosis    Hypothyroidism    Migraine    Murmur    pt reports MVP   Sessile colonic polyp    Splenomegaly    Steatohepatitis    Syncope    under eval ? cause   Tachycardia    episodes with near syncope eval dr Nadyne Coombes  on no meds dced LABA    Past Surgical History:  Procedure Laterality Date   BIOPSY  09/03/2018   Procedure: BIOPSY;  Surgeon: Jerene Bears, MD;  Location: [facility];  Service: Gastroenterology;;   broken right femur  2008   rod placed   COLONOSCOPY WITH PROPOFOL N/A 09/03/2018   Procedure: COLONOSCOPY WITH PROPOFOL;  Surgeon: Jerene Bears, MD;  Location: [facility];  Service: Gastroenterology;  Laterality: N/A;   DILATION AND CURETTAGE OF UTERUS     ESOPHAGOGASTRODUODENOSCOPY (EGD) WITH PROPOFOL N/A 09/03/2018   Procedure: ESOPHAGOGASTRODUODENOSCOPY (EGD) WITH PROPOFOL;  Surgeon: Jerene Bears, MD;  Location: [facility];  Service: Gastroenterology;  Laterality: N/A;   IR TRANSCATHETER BX  01/19/2019   IR US GUIDE VASC ACCESS RIGHT  01/19/2019   IR VENOGRAM HEPATIC W HEMODYNAMIC EVALUATION  01/19/2019   OB ultrasound N/A 12/01/2017   see report   POLYPECTOMY  09/03/2018   Procedure: POLYPECTOMY;  Surgeon: Jerene Bears, MD;  Location: [facility];  Service: Gastroenterology;;   TONSILLECTOMY  2006   WISDOM TOOTH EXTRACTION      Family History  Problem Relation Age of Onset   Asthma Father    Arthritis Father    Hyperlipidemia Father    Heart disease Father    Hypertension Father  Diabetes Father    Colon polyps Father    Arthritis Mother    Hyperlipidemia Mother    Heart disease Mother    Hypertension Mother    Diabetes Mother    Kidney disease Mother    Liver disease Mother    Bleeding Disorder Mother    Colon polyps Mother    Arthritis Maternal Grandmother    Breast cancer Maternal Grandmother    Thyroid disease Maternal Grandmother    Colon cancer Paternal Grandfather    Diabetes Paternal Grandfather    Liver cancer Paternal Grandfather    Stomach cancer Paternal Grandfather    Breast cancer Paternal Grandmother    Diabetes Paternal Grandmother    Breast cancer Paternal Aunt    Esophageal cancer Paternal Aunt    Breast cancer Maternal Aunt    Diabetes Maternal Grandfather    Pancreatic cancer Maternal Grandfather     Social History   Socioeconomic History   Marital status: Single    Spouse name: Not on file   Number of children: 1   Years of education: Not on file   Highest education level: Not on file  Occupational History   Not on file  Tobacco Use   Smoking status: Never Smoker    Smokeless tobacco: Never Used  Substance and Sexual Activity   Alcohol use: No    Alcohol/week: 0.0 standard drinks   Drug use: No   Sexual activity: Yes    Birth control/protection: None  Other Topics Concern   Not on file  Social History Narrative   6-7 hours of sleep per night   Works full time   Lives with her parents sis and  Infant nephew   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   Divorced   8 week preg loss 29-Sep-2022     Son passed away Aug 28, 2017   Social Determinants of Health   Financial Resource Strain:    Difficulty of Paying Living Expenses: Not on file  Food Insecurity:    Worried About Charity fundraiser in the Last Year: Not on file   [employer] of Food in the Last Year: Not on file  Transportation Needs:    Lack of Transportation (Medical): Not on file   Lack of Transportation (Non-Medical): Not on file  Physical Activity:    Days of Exercise per Week: Not on file   Minutes of Exercise per Session: Not on file  Stress:    Feeling of Stress : Not on file  Social Connections:    Frequency of Communication with Friends and Family: Not on file   Frequency of Social Gatherings with Friends and Family: Not on file   Attends Religious Services: Not on file   Active Member of Clubs or Organizations: Not on file   Attends Archivist Meetings: Not on file   Marital Status: Not on file    Current Outpatient Medications on File Prior to Visit  Medication Sig Dispense Refill   acetaminophen (TYLENOL) 500 MG tablet Take 500 mg by mouth every 6 (six) hours as needed for moderate pain or headache.     albuterol (PROVENTIL) (2.5 MG/3ML) 0.083% nebulizer solution Use 1 vial nebulized every 4-6 hours as needed for cough, wheeze, shortness of breath or chest tightness (Patient taking differently: Take 2.5 mg by nebulization every 4 (four) hours as needed for wheezing or shortness of breath. ) 180 mL 5   albuterol (VENTOLIN  HFA) 108 (90 Base) MCG/ACT  inhaler Inhale 2 puffs into the lungs every 4 (four) hours as needed for wheezing or shortness of breath. 18 g 0   ARIPiprazole (ABILIFY) 10 MG tablet Take 10 mg by mouth at bedtime.      Ascorbic Acid (VITAMIN C WITH ROSE HIPS) 500 MG tablet Take 500 mg by mouth 2 (two) times daily.     Beclomethasone Dipropionate (QNASL) 80 MCG/ACT AERS INSTILL 1 SPRAY IN EACH NOSTRIL ONCE DAILY AS DIRECTED (Patient taking differently: Place 1 spray into both nostrils daily. ) 10.6 g 3   calcium carbonate (OSCAL) 1500 (600 Ca) MG TABS tablet Take 600 mg of elemental calcium by mouth 2 (two) times daily with a meal.     cetirizine (ZYRTEC) 5 MG tablet Take 5 mg by mouth 2 (two) times daily.     Cholecalciferol (VITAMIN D-3) 125 MCG (5000 UT) TABS Take 5,000 Units by mouth 2 (two) times daily.     dicyclomine (BENTYL) 20 MG tablet Take 10 mg by mouth every 4-6 hours as needed. (Patient taking differently: Take 20 mg by mouth 2 (two) times daily as needed (abdominal spasms.). ) 120 tablet 3   diphenoxylate-atropine (LOMOTIL) 2.5-0.025 MG tablet TAKE 1 TABLET BY MOUTH FOUR TIMES DAILY AS NEEDED FOR DIARRHEA (Patient not taking: Reported on 06/21/2019) 60 tablet 1   Dupilumab (DUPIXENT) 300 MG/2ML SOSY Inject 300 mg into the skin every 14 (fourteen) days. 4 mL 11   esomeprazole (NEXIUM) 40 MG capsule TAKE 1 CAPSULE BY MOUTH TWO TIMES DAILY BEFORE A MEAL (Patient taking differently: Take 40 mg by mouth 2 (two) times daily. ) 60 capsule 4   ferrous sulfate 325 (65 FE) MG tablet Take 325 mg by mouth 2 (two) times daily.     gabapentin (NEURONTIN) 100 MG capsule Take 100-200 mg by mouth 3 (three) times daily as needed (pain.).      gabapentin (NEURONTIN) 300 MG capsule Take 300 mg by mouth at bedtime.      lamoTRIgine (LAMICTAL) 150 MG tablet Take 150 mg by mouth daily.     levothyroxine (SYNTHROID) 88 MCG tablet TAKE 1 TABLET BY MOUTH DAILY. (Patient taking differently: Take 88 mcg by mouth daily before breakfast. ) 15 tablet  0   loperamide (IMODIUM) 2 MG capsule Take 2 mg by mouth 4 (four) times daily as needed for diarrhea or loose stools.      LORazepam (ATIVAN) 0.5 MG tablet Take 0.5 mg by mouth 2 (two) times daily as needed for anxiety.      montelukast (SINGULAIR) 10 MG tablet TAKE 1 TABLET BY MOUTH DAILY. (Patient taking differently: Take 10 mg by mouth daily. ) 30 tablet 0   olopatadine (PATANOL) 0.1 % ophthalmic solution Use one drop in each eye twice daily if needed (Patient taking differently: Place 1 drop into both eyes 2 (two) times daily as needed for allergies. ) 5 mL 5   ondansetron (ZOFRAN) 8 MG tablet Take 8 mg by mouth daily.     Prenatal Vit-Fe Fumarate-FA (MULTIVITAMIN-PRENATAL) 27-0.8 MG TABS tablet Take 1 tablet by mouth 2 (two) times daily.     promethazine (PHENERGAN) 25 MG tablet Take 25-50 mg by mouth every 6 (six) hours as needed for nausea or vomiting.      SYMBICORT 160-4.5 MCG/ACT inhaler INHALE 2 PUFFS BY MOUTH TWICE A DAY TO PREVENT COUGH OR WHEEZE. RINSE, GARGLE, AND SPIT AFTER USE. (Patient taking differently: Inhale 2 puffs into the lungs 2 (two)  times daily. ) 10.2 g 5   topiramate (TOPAMAX) 50 MG tablet Take 50 mg by mouth 2 (two) times daily.     vitamin E 400 UNIT capsule Take 400 Units by mouth 2 (two) times daily.     No current facility-administered medications on file prior to visit.    Review of Systems: a complete, 10pt review of systems was completed with pertinent positives and negatives as documented in the HPI  Physical Exam: Alert and cooperative    CBC Latest Ref Rng & Units 03/11/2019 01/19/2019 11/26/2018  WBC 4.0 - 10.5 K/uL 6.3 7.7 8.8  Hemoglobin 12.0 - 15.0 g/dL 14.1 14.0 13.4  Hematocrit 36.0 - 46.0 % 44.0 44.2 41.1  Platelets 150.0 - 400.0 K/uL 81.0 Repeated and verified X2.(L) 83(L) 86.0(L)    CMP Latest Ref Rng & Units 03/11/2019 01/19/2019 11/26/2018  Glucose 70 - 99 mg/dL 88 106(H) 118(H)  BUN 6 - 23 mg/dL _0 Creatinine 0.40 - 1.20 mg/dL 1.00 0.90  0.92  Sodium 135 - 145 mEq/L 143 142 141  Potassium 3.5 - 5.1 mEq/L 4.4 4.3 4.3  Chloride 96 - 112 mEq/L 109 114(H) 109  CO2 19 - 32 mEq/L _1 Calcium 8.4 - 10.5 mg/dL 9.2 8.6(L) 8.8  Total Protein 6.0 - 8.3 g/dL 6.1 6.5 6.2  Total Bilirubin 0.2 - 1.2 mg/dL 0.5 0.6 0.6  Alkaline Phos 39 - 117 U/L 116 125 126(H)  AST 0 - 37 U/L 44(H) 61(H) 62(H)  ALT 0 - 35 U/L 58(H) 66(H) 72(H)    Lab Results  Component Value Date   INR 1.1 01/19/2019   INR 1.1 (H) 11/26/2018   INR 1.2 (H) 04/01/2018    Imaging: No results found.   A/P: 29yo woman with NASH/ portal hypertension. Childs A, MELD 7. HPVG 9. Severe obesity refractory to lifestyle changes. We have had extensive and thorough discussions of the risks vs benefits of and alternatives to surgical management of obesity, which is at least a partial contributor to her liver disease. She understands and accepts that she is at increased risk of perioperative morbidity and mortality and after informed discussions with myself and her hepatologist, wishes to proceed with sleeve gastrectomy.  Patient Active Problem List   Diagnosis Date Noted   Portal hypertension ([place])    Chronic diarrhea    Benign neoplasm of ascending colon    Benign neoplasm of transverse colon    Benign neoplasm of rectum    Thrombocytopenia ([place]) 08/12/2018   Spontaneous vaginal delivery 12/22/2017   IUFD at 20 weeks or more of gestation 12/20/2017   Snoring 11/10/2017   Gestational diabetes 10/12/2017   Morbid obesity ([place]) 05/19/2017   Adult hypothyroidism 05/19/2017   BMI 40.0-44.9, adult ([place]) 11/09/2015   GERD (gastroesophageal reflux disease)    Tachycardia    Syncope    Allergic rhinitis    Persistent asthma with undetermined severity    Migraine with aura 09/26/2011       Romana Juniper, MD [facility]  See AMION to contact appropriate on-call provider

## 2019-06-22 NOTE — H&P (Signed)
Surgical H&P Requesting provider: Dr. [name]  CC: morbid obesity  HPI: Follows up for further discussion of surgical management of obesity.  She has been doing well and has learned a lot from meeting with the dietitians and continues to follow up with the therapist that she met for her bariatric evaluation.  She has made minimal progress with weight loss.  Her last visit with the liver care clinic was in August.  She has subsequently had a transjugular biopsy confirming portal hypertension and with a biopsy now confirming F3/4 fibrosis.  Last note from [facility] noted in the media section of Epic- and I went over this with the patient though she was already aware- given her current labs and liver function, she is quoted a 1% seven-day mortality rate, 5% 30 day mortality and 8% 90 day mortality risk.  We also discussed that she is at higher risk of intraoperative bleeding, and there is a possibility of needing to abort the procedure.  She is aware of and accepting of all of this, but continues to feel that proceeding with sleeve gastrectomy is the right decision for her. Given anticipated weight loss, I am in agreement there is benefit to considering this procedure.   Transjugular hepatic venogram: FINDINGS: Normal right hepatic venogram.  Pressures:  Wedge portal venous  17/14 (15) mmHg  Right hepatic vein 8/4 (6) mmHg  Right atrium 4/0 (2) mmHg   IMPRESSION: 1. Right hepatic venogram confirms appropriate catheter position. 2. Elevated hepatic venous pressure gradient of 9 mmHg. 3. Technically successful transjugular core liver biopsy .  CXR/UGI 03/04/19- normal Cardiac Clearance 03/17/19- Miquel Dunn NP Nutriionist- Lexine Baton Short  Initial visit 02/18/19: 29yo woman referred by Dr. [name] for consideration of bariatric surgery.  She has a history of NASH with portal hypertension, esophageal varices, splenomegaly, and mild thrombocytopenia (80s), GERD (well controlled on nexium), morbid  obesity, colonic polyps, hyperlipidemia, hypothyroidism (endocrinologist is Dr. Dwyane Dee).  She has a history of irregular tachycardia and sees Dr. Einar Gip.  Also has a history of irritable bowel syndrome with diarrhea on multiple agents.  Follows with Dr. [name]. Also has a history of psychiatric disease and is on Topamax and lithium, Per EPIC History of significant asthma on albuterol, beclomethasone, dupilumab, Symbicort..  Her primary motivations for pursuing weight loss surgery are to improve her overall health and specifically her liver disease, and to be able to carry a pregnancy.  Her liver problems actually began with pregnancy last year, she states that prior to this she did not have any known over dysfunction and on review of her labs, it seems that they were grossly normal prior to 2019 including normal platelets.  She states that she had "blocked bile ducts"-review of chart makes note of cholestasis of pregnancy.  Unfortunately she had intrauterine fetal demise at 56 weeks. 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  Small hiatal hernia.  Colonoscopy in March with 2 polyps in the descending colon, 2 polyps in the transverse colon and one polyp in the rectum, otherwise normal  CMP and CBC last: Normal sodium, Demonstrates AST/ALT in the 60s, normal bilirubin, normal albumin.  Platelets 83, INR 1.1.  CT scan last year shows the spleen enlarged to 15 cm.  Portal venogram last month confirms portal hypertension.   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  Also  in college.  Denies alcohol tobacco or drug use.  She endorses weakness for sweet tea, and because of her very busy work and school schedule, she eats a lot of "fast food salads" and cafeteria food.  Fam Hx: Significant family history of obesity.  Her father had a lap band but did  not have good results due to poor follow-up.  Arthritis, hyperlipidemia, heart disease, hypertension, diabetes, kidney and liver disease, bleeding disorder and colon polyps in her mother, all of the above and her father as well as asthma, history of breast cancer in both grandmothers, pancreatic cancer in maternal grandfather, diabetes in 3 out of 4 grandparents, liver, stomach and colon cancer in paternal grandfather, breast cancer in aunts on both sides and esophageal cancer in a paternal aunt.  Allergies  Allergen Reactions   Progesterone Rash    Was in a form of birth control.   Penicillins Rash    Did it involve swelling of the face/tongue/throat, SOB, or low BP? No Did it involve sudden or severe rash/hives, skin peeling, or any reaction on the inside of your mouth or nose? Yes Did you need to seek medical attention at a hospital or doctor's office? No When did it last happen?      9 + months If all above answers are "NO", may proceed with cephalosporin use.      Past Medical History:  Diagnosis Date   Allergic rhinitis    remote hx of allergy shots   Asthma    since childhood  on controller meds extrinsic dr Carmelina Peal   Gallbladder sludge    Gastropathy    GERD (gastroesophageal reflux disease)    on nexium  for  long term sx since childhood   H/O miscarriage, not currently pregnant    [redacted] weeks  march 2016   Hepatic steatosis    Hypothyroidism    Migraine    Murmur    pt reports MVP   Sessile colonic polyp    Splenomegaly    Steatohepatitis    Syncope    under eval ? cause   Tachycardia    episodes with near syncope eval dr Nadyne Coombes  on no meds dced LABA    Past Surgical History:  Procedure Laterality Date   BIOPSY  09/03/2018   Procedure: BIOPSY;  Surgeon: Jerene Bears, MD;  Location: [facility];  Service: Gastroenterology;;   broken right femur  2008   rod placed   COLONOSCOPY WITH PROPOFOL N/A 09/03/2018   Procedure: COLONOSCOPY WITH PROPOFOL;  Surgeon: Jerene Bears, MD;  Location: [facility];  Service: Gastroenterology;  Laterality: N/A;   DILATION AND CURETTAGE OF UTERUS     ESOPHAGOGASTRODUODENOSCOPY (EGD) WITH PROPOFOL N/A 09/03/2018   Procedure: ESOPHAGOGASTRODUODENOSCOPY (EGD) WITH PROPOFOL;  Surgeon: Jerene Bears, MD;  [facility];  Service: Gastroenterology;  Laterality: N/A;   IR TRANSCATHETER BX  01/19/2019   IR US GUIDE VASC ACCESS RIGHT  01/19/2019   IR VENOGRAM HEPATIC W HEMODYNAMIC EVALUATION  01/19/2019   OB ultrasound N/A 12/01/2017   see report   POLYPECTOMY  09/03/2018   Procedure: POLYPECTOMY;  Surgeon: Jerene Bears, MD;  Location: [facility];  Service: Gastroenterology;;   TONSILLECTOMY  2006   WISDOM TOOTH EXTRACTION      Family History  Problem Relation Age of Onset   Asthma Father    Arthritis Father    Hyperlipidemia Father    Heart disease Father    Hypertension Father  Diabetes Father    Colon polyps Father    Arthritis Mother    Hyperlipidemia Mother    Heart disease Mother    Hypertension Mother    Diabetes Mother    Kidney disease Mother    Liver disease Mother    Bleeding Disorder Mother    Colon polyps Mother    Arthritis Maternal Grandmother    Breast cancer Maternal Grandmother    Thyroid disease Maternal Grandmother    Colon cancer Paternal Grandfather    Diabetes Paternal Grandfather    Liver cancer Paternal Grandfather    Stomach cancer Paternal Grandfather    Breast cancer Paternal Grandmother    Diabetes Paternal Grandmother    Breast cancer Paternal Aunt    Esophageal cancer Paternal Aunt    Breast cancer Maternal Aunt    Diabetes Maternal Grandfather    Pancreatic cancer Maternal Grandfather     Social History   Socioeconomic History   Marital status: Single    Spouse name: Not on file   Number of children: 1   Years of education: Not on file   Highest education level: Not on file  Occupational History   Not on file  Tobacco Use   Smoking status: Never Smoker    Smokeless tobacco: Never Used  Substance and Sexual Activity   Alcohol use: No    Alcohol/week: 0.0 standard drinks   Drug use: No   Sexual activity: Yes    Birth control/protection: None  Other Topics Concern   Not on file  Social History Narrative   6-7 hours of sleep per night   Works full time   Lives with her parents sis and  Infant nephew   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   Divorced   8 week preg loss 29-Sep-2022     Son passed away Aug 28, 2017   Social Determinants of Health   Financial Resource Strain:    Difficulty of Paying Living Expenses: Not on file  Food Insecurity:    Worried About Charity fundraiser in the Last Year: Not on file   [employer] of Food in the Last Year: Not on file  Transportation Needs:    Lack of Transportation (Medical): Not on file   Lack of Transportation (Non-Medical): Not on file  Physical Activity:    Days of Exercise per Week: Not on file   Minutes of Exercise per Session: Not on file  Stress:    Feeling of Stress : Not on file  Social Connections:    Frequency of Communication with Friends and Family: Not on file   Frequency of Social Gatherings with Friends and Family: Not on file   Attends Religious Services: Not on file   Active Member of Clubs or Organizations: Not on file   Attends Archivist Meetings: Not on file   Marital Status: Not on file    Current Outpatient Medications on File Prior to Visit  Medication Sig Dispense Refill   acetaminophen (TYLENOL) 500 MG tablet Take 500 mg by mouth every 6 (six) hours as needed for moderate pain or headache.     albuterol (PROVENTIL) (2.5 MG/3ML) 0.083% nebulizer solution Use 1 vial nebulized every 4-6 hours as needed for cough, wheeze, shortness of breath or chest tightness (Patient taking differently: Take 2.5 mg by nebulization every 4 (four) hours as needed for wheezing or shortness of breath. ) 180 mL 5   albuterol (VENTOLIN  HFA) 108 (90 Base) MCG/ACT  inhaler Inhale 2 puffs into the lungs every 4 (four) hours as needed for wheezing or shortness of breath. 18 g 0   ARIPiprazole (ABILIFY) 10 MG tablet Take 10 mg by mouth at bedtime.      Ascorbic Acid (VITAMIN C WITH ROSE HIPS) 500 MG tablet Take 500 mg by mouth 2 (two) times daily.     Beclomethasone Dipropionate (QNASL) 80 MCG/ACT AERS INSTILL 1 SPRAY IN EACH NOSTRIL ONCE DAILY AS DIRECTED (Patient taking differently: Place 1 spray into both nostrils daily. ) 10.6 g 3   calcium carbonate (OSCAL) 1500 (600 Ca) MG TABS tablet Take 600 mg of elemental calcium by mouth 2 (two) times daily with a meal.     cetirizine (ZYRTEC) 5 MG tablet Take 5 mg by mouth 2 (two) times daily.     Cholecalciferol (VITAMIN D-3) 125 MCG (5000 UT) TABS Take 5,000 Units by mouth 2 (two) times daily.     dicyclomine (BENTYL) 20 MG tablet Take 10 mg by mouth every 4-6 hours as needed. (Patient taking differently: Take 20 mg by mouth 2 (two) times daily as needed (abdominal spasms.). ) 120 tablet 3   diphenoxylate-atropine (LOMOTIL) 2.5-0.025 MG tablet TAKE 1 TABLET BY MOUTH FOUR TIMES DAILY AS NEEDED FOR DIARRHEA (Patient not taking: Reported on 06/21/2019) 60 tablet 1   Dupilumab (DUPIXENT) 300 MG/2ML SOSY Inject 300 mg into the skin every 14 (fourteen) days. 4 mL 11   esomeprazole (NEXIUM) 40 MG capsule TAKE 1 CAPSULE BY MOUTH TWO TIMES DAILY BEFORE A MEAL (Patient taking differently: Take 40 mg by mouth 2 (two) times daily. ) 60 capsule 4   ferrous sulfate 325 (65 FE) MG tablet Take 325 mg by mouth 2 (two) times daily.     gabapentin (NEURONTIN) 100 MG capsule Take 100-200 mg by mouth 3 (three) times daily as needed (pain.).      gabapentin (NEURONTIN) 300 MG capsule Take 300 mg by mouth at bedtime.      lamoTRIgine (LAMICTAL) 150 MG tablet Take 150 mg by mouth daily.     levothyroxine (SYNTHROID) 88 MCG tablet TAKE 1 TABLET BY MOUTH DAILY. (Patient taking differently: Take 88 mcg by mouth daily before breakfast. ) 15 tablet  0   loperamide (IMODIUM) 2 MG capsule Take 2 mg by mouth 4 (four) times daily as needed for diarrhea or loose stools.      LORazepam (ATIVAN) 0.5 MG tablet Take 0.5 mg by mouth 2 (two) times daily as needed for anxiety.      montelukast (SINGULAIR) 10 MG tablet TAKE 1 TABLET BY MOUTH DAILY. (Patient taking differently: Take 10 mg by mouth daily. ) 30 tablet 0   olopatadine (PATANOL) 0.1 % ophthalmic solution Use one drop in each eye twice daily if needed (Patient taking differently: Place 1 drop into both eyes 2 (two) times daily as needed for allergies. ) 5 mL 5   ondansetron (ZOFRAN) 8 MG tablet Take 8 mg by mouth daily.     Prenatal Vit-Fe Fumarate-FA (MULTIVITAMIN-PRENATAL) 27-0.8 MG TABS tablet Take 1 tablet by mouth 2 (two) times daily.     promethazine (PHENERGAN) 25 MG tablet Take 25-50 mg by mouth every 6 (six) hours as needed for nausea or vomiting.      SYMBICORT 160-4.5 MCG/ACT inhaler INHALE 2 PUFFS BY MOUTH TWICE A DAY TO PREVENT COUGH OR WHEEZE. RINSE, GARGLE, AND SPIT AFTER USE. (Patient taking differently: Inhale 2 puffs into the lungs 2 (two)  times daily. ) 10.2 g 5   topiramate (TOPAMAX) 50 MG tablet Take 50 mg by mouth 2 (two) times daily.     vitamin E 400 UNIT capsule Take 400 Units by mouth 2 (two) times daily.     No current facility-administered medications on file prior to visit.    Review of Systems: a complete, 10pt review of systems was completed with pertinent positives and negatives as documented in the HPI  Physical Exam: Alert and cooperative    CBC Latest Ref Rng & Units 03/11/2019 01/19/2019 11/26/2018  WBC 4.0 - 10.5 K/uL 6.3 7.7 8.8  Hemoglobin 12.0 - 15.0 g/dL 14.1 14.0 13.4  Hematocrit 36.0 - 46.0 % 44.0 44.2 41.1  Platelets 150.0 - 400.0 K/uL 81.0 Repeated and verified X2.(L) 83(L) 86.0(L)    CMP Latest Ref Rng & Units 03/11/2019 01/19/2019 11/26/2018  Glucose 70 - 99 mg/dL 88 106(H) 118(H)  BUN 6 - 23 mg/dL _0 Creatinine 0.40 - 1.20 mg/dL 1.00 0.90  0.92  Sodium 135 - 145 mEq/L 143 142 141  Potassium 3.5 - 5.1 mEq/L 4.4 4.3 4.3  Chloride 96 - 112 mEq/L 109 114(H) 109  CO2 19 - 32 mEq/L _1 Calcium 8.4 - 10.5 mg/dL 9.2 8.6(L) 8.8  Total Protein 6.0 - 8.3 g/dL 6.1 6.5 6.2  Total Bilirubin 0.2 - 1.2 mg/dL 0.5 0.6 0.6  Alkaline Phos 39 - 117 U/L 116 125 126(H)  AST 0 - 37 U/L 44(H) 61(H) 62(H)  ALT 0 - 35 U/L 58(H) 66(H) 72(H)    Lab Results  Component Value Date   INR 1.1 01/19/2019   INR 1.1 (H) 11/26/2018   INR 1.2 (H) 04/01/2018    Imaging: No results found.   A/P: 29yo woman with NASH/ portal hypertension. Childs A, MELD 7. HPVG 9. Severe obesity refractory to lifestyle changes. We have had extensive and thorough discussions of the risks vs benefits of and alternatives to surgical management of obesity, which is at least a partial contributor to her liver disease. She understands and accepts that she is at increased risk of perioperative morbidity and mortality and after informed discussions with myself and her hepatologist, wishes to proceed with sleeve gastrectomy.  Patient Active Problem List   Diagnosis Date Noted   Portal hypertension ([place])    Chronic diarrhea    Benign neoplasm of ascending colon    Benign neoplasm of transverse colon    Benign neoplasm of rectum    Thrombocytopenia ([place]) 08/12/2018   Spontaneous vaginal delivery 12/22/2017   IUFD at 20 weeks or more of gestation 12/20/2017   Snoring 11/10/2017   Gestational diabetes 10/12/2017   Morbid obesity ([place]) 05/19/2017   Adult hypothyroidism 05/19/2017   BMI 40.0-44.9, adult ([place]) 11/09/2015   GERD (gastroesophageal reflux disease)    Tachycardia    Syncope    Allergic rhinitis    Persistent asthma with undetermined severity    Migraine with aura 09/26/2011       Romana Juniper, MD [facility]  See AMION to contact appropriate on-call provider

## 2019-06-24 ENCOUNTER — Ambulatory Visit (INDEPENDENT_AMBULATORY_CARE_PROVIDER_SITE_OTHER): Payer: 59 | Admitting: Professional

## 2019-06-24 ENCOUNTER — Other Ambulatory Visit (HOSPITAL_COMMUNITY)
Admission: RE | Admit: 2019-06-24 | Discharge: 2019-06-24 | Disposition: A | Discharge status: Home / Self Care | Payer: 59 | Source: Ambulatory Visit | Attending: Surgery | Admitting: Surgery

## 2019-06-24 ENCOUNTER — Other Ambulatory Visit: Payer: Self-pay

## 2019-06-24 ENCOUNTER — Encounter (HOSPITAL_COMMUNITY): Payer: Self-pay

## 2019-06-24 ENCOUNTER — Inpatient Hospital Stay (HOSPITAL_COMMUNITY)
Admission: RE | Admit: 2019-06-24 | Discharge: 2019-06-24 | Disposition: A | Discharge status: Home / Self Care | Payer: 59 | Source: Ambulatory Visit

## 2019-06-24 ENCOUNTER — Encounter (HOSPITAL_COMMUNITY)
Admission: RE | Admit: 2019-06-24 | Discharge: 2019-06-24 | Disposition: A | Discharge status: Home / Self Care | Payer: 59 | Source: Ambulatory Visit | Attending: Surgery | Admitting: Surgery

## 2019-06-24 DIAGNOSIS — Z20822 Contact with and (suspected) exposure to covid-19: Secondary | ICD-10-CM | POA: Diagnosis not present

## 2019-06-24 DIAGNOSIS — Z01812 Encounter for preprocedural laboratory examination: Secondary | ICD-10-CM | POA: Diagnosis not present

## 2019-06-24 DIAGNOSIS — F411 Generalized anxiety disorder: Secondary | ICD-10-CM | POA: Diagnosis not present

## 2019-06-24 HISTORY — DX: Family history of other specified conditions: Z84.89

## 2019-06-24 LAB — CBC WITH DIFFERENTIAL/PLATELET
Abs Immature Granulocytes: 0.02 10*3/uL (ref 0.00–0.07)
Basophils Absolute: 0 10*3/uL (ref 0.0–0.1)
Basophils Relative: 0 %
Eosinophils Absolute: 0.1 10*3/uL (ref 0.0–0.5)
Eosinophils Relative: 2 %
HCT: 45.1 % (ref 36.0–46.0)
Hemoglobin: 14.2 g/dL (ref 12.0–15.0)
Immature Granulocytes: 0 %
Lymphocytes Relative: 27 %
Lymphs Abs: 1.9 10*3/uL (ref 0.7–4.0)
MCH: 31 pg (ref 26.0–34.0)
MCHC: 31.5 g/dL (ref 30.0–36.0)
MCV: 98.5 fL (ref 80.0–100.0)
Monocytes Absolute: 0.4 10*3/uL (ref 0.1–1.0)
Monocytes Relative: 6 %
Neutro Abs: 4.5 10*3/uL (ref 1.7–7.7)
Neutrophils Relative %: 65 %
Platelets: 75 10*3/uL — ABNORMAL LOW (ref 150–400)
RBC: 4.58 MIL/uL (ref 3.87–5.11)
RDW: 14 % (ref 11.5–15.5)
WBC: 7 10*3/uL (ref 4.0–10.5)
nRBC: 0 % (ref 0.0–0.2)

## 2019-06-24 LAB — COMPREHENSIVE METABOLIC PANEL
ALT: 50 U/L — ABNORMAL HIGH (ref 0–44)
AST: 38 U/L (ref 15–41)
Albumin: 4 g/dL (ref 3.5–5.0)
Alkaline Phosphatase: 105 U/L (ref 38–126)
Anion gap: 7 (ref 5–15)
BUN: 17 mg/dL (ref 6–20)
CO2: 22 mmol/L (ref 22–32)
Calcium: 9.1 mg/dL (ref 8.9–10.3)
Chloride: 110 mmol/L (ref 98–111)
Creatinine, Ser: 0.79 mg/dL (ref 0.44–1.00)
GFR calc Af Amer: >60 mL/min (ref >60)
GFR calc non Af Amer: >60 mL/min (ref >60)
Glucose, Bld: 80 mg/dL (ref 70–99)
Potassium: 4.3 mmol/L (ref 3.5–5.1)
Sodium: 139 mmol/L (ref 135–145)
Total Bilirubin: 0.8 mg/dL (ref 0.3–1.2)
Total Protein: 6.6 g/dL (ref 6.5–8.1)

## 2019-06-24 LAB — ABO/RH: ABO/RH(D): O POS

## 2019-06-24 NOTE — Progress Notes (Signed)
cbcdiff obtained today routed via epic to Dr Romana Juniper.

## 2019-06-24 NOTE — Progress Notes (Signed)
PCP - Dr. Idell Pickles Cardiologist - Dr. Christen Butter the patient reports that she has a fast HR.  Chest x-ray - 03/04/19 EKG - 03/17/19 Stress Test - no ECHO - no Cardiac Cath - no  Sleep Study - no CPAP - no  Fasting Blood Sugar - NA Checks Blood Sugar _____ times a day  Blood Thinner Instructions:NA Aspirin Instructions: Last Dose:  Anesthesia review:   Patient denies shortness of breath, fever, cough and chest pain at PAT appointment yes  Patient verbalized understanding of instructions that were given to them at the PAT appointment. Patient was also instructed that they will need to review over the PAT instructions again at home before surgery. yes

## 2019-06-25 LAB — NOVEL CORONAVIRUS, NAA (HOSP ORDER, SEND-OUT TO REF LAB; TAT 18-24 HRS): SARS-CoV-2, NAA: NOT DETECTED

## 2019-06-27 ENCOUNTER — Other Ambulatory Visit: Payer: Self-pay | Admitting: *Deleted

## 2019-06-27 MED ORDER — BUPIVACAINE LIPOSOME 1.3 % IJ SUSP
20.0000 mL | INTRAMUSCULAR | Status: DC
  Filled 2019-06-27: qty 20

## 2019-06-27 NOTE — Anesthesia Preprocedure Evaluation (Addendum)
Anesthesia Evaluation  Patient identified by MRN, date of birth, ID band Patient awake    Reviewed: Allergy & Precautions, NPO status , Patient's Chart, lab work & pertinent test results  History of Anesthesia Complications Negative for: history of anesthetic complications  Airway Mallampati: II  TM Distance: >3 FB Neck ROM: Full    Dental  (+) Chipped,    Pulmonary asthma ,    Pulmonary exam normal        Cardiovascular negative cardio ROS Normal cardiovascular exam     Neuro/Psych  Headaches,    GI/Hepatic GERD  Medicated and Controlled,NASH, portal HTN Grade 1 small varices in the lower third of the esophagus, small hiatal hernia   Endo/Other  diabetesHypothyroidism Morbid obesity (BMI 51)  Renal/GU negative Renal ROS  negative genitourinary   Musculoskeletal negative musculoskeletal ROS (+)   Abdominal   Peds  Hematology Chronic thrombocytopenia, plts 75k   Anesthesia Other Findings Day of surgery medications reviewed with patient.  Reproductive/Obstetrics negative OB ROS                            Anesthesia Physical Anesthesia Plan  ASA: III  Anesthesia Plan: General   Post-op Pain Management:    Induction: Intravenous  PONV Risk Score and Plan: 4 or greater and Scopolamine patch - Pre-op, Treatment may vary due to age or medical condition, Ondansetron, Dexamethasone and Midazolam  Airway Management Planned: Oral ETT  Additional Equipment: None  Intra-op Plan:   Post-operative Plan: Extubation in OR  Informed Consent: I have reviewed the patients History and Physical, chart, labs and discussed the procedure including the risks, benefits and alternatives for the proposed anesthesia with the patient or authorized representative who has indicated his/her understanding and acceptance.     Dental advisory given  Plan Discussed with: CRNA  Anesthesia Plan Comments:         Anesthesia Quick Evaluation

## 2019-06-27 NOTE — Progress Notes (Signed)
Anesthesia Chart Review   Case: [id number] Date/Time: 06/28/19 0934   Procedure: LAPAROSCOPIC GASTRIC SLEEVE RESECTION, Upper Endo, ERAS Pathway (N/A )   Anesthesia type: General   Pre-op diagnosis: Morbid Obesity, NASH, GERD, Hypothryoidism, Portal HTN   [facility] ROOM 01 / [facility]   Surgeons: Clovis Riley, MD      DISCUSSION:29 y.o. never smoker with h/o GERD, hypothyroidism, asthma, morbid obesity, NASH, portal HTN, thrombocytopenia, scheduled for above procedure 06/28/19 with Dr. Romana Juniper.   Pt seen by cardiology for palpitations.  Pt last seen by cardiology 03/17/2019 for preoperative evaluation.  Per OV note, "Patient is overall doing well, she continues to have occasional episodes of palpitations and sinus tachycardia, but appear to be stable. She has worn event monitors in 2019 and 2016 without arrhythmias. Echocardiogram was also performed 1 year ago without any structural abnormalities. Weight loss is imperative for her in view of portal hypertension and also decreasing her risk factors. I see no contraindication for her to proceed with gastric sleeve procedure and feel that she may proceed low risk for perioperative complications. She is without chest pain or shortness of breath. No hypertension. I will see her back on a PRN basis."  Per PCP, Dr. Shanon Ace, it is appropriate for patient to proceed with weight loss surgery.    Chronic thrombocytopenia, Platelets 75 06/24/2019.  Forwarded to Surgeon.   VS: BP (!) 145/80 (BP Location: Left Arm)   Pulse 84   Temp 36.9 C (Oral)   Resp 17   Ht 5' 5"  (1.651 m)   Wt (!) 139.3 kg   SpO2 98%   BMI 51.10 kg/m   PROVIDERS: Panosh, Standley Brooking, MD is PCP   Adrian Prows, MD is Cardiologist  LABS: Routed to Surgeon (all labs ordered are listed, but only abnormal results are displayed)  Labs Reviewed  CBC WITH DIFFERENTIAL/PLATELET - Abnormal; Notable for the following components:      Result Value   Platelets 75 (*)    All  other components within normal limits  COMPREHENSIVE METABOLIC PANEL - Abnormal; Notable for the following components:   ALT 50 (*)    All other components within normal limits  TYPE AND SCREEN  ABO/RH     IMAGES:   EKG: 03/17/2019 Rate 88 bpm Normal sinus rhythm at 88bpm, normal axis, no evidence of ischemia   CV:  Past Medical History:  Diagnosis Date  . Allergic rhinitis    remote hx of allergy shots  . Asthma    since childhood  on controller meds extrinsic dr Carmelina Peal  . Family history of adverse reaction to anesthesia   . Gallbladder sludge   . Gastropathy   . GERD (gastroesophageal reflux disease)    on nexium  for  long term sx since childhood  . H/O miscarriage, not currently pregnant    [redacted] weeks  march 2016  . Hepatic steatosis   . Hypothyroidism   . Migraine   . Murmur    pt reports MVP  . Sessile colonic polyp   . Splenomegaly   . Steatohepatitis   . Syncope    under eval ? cause  . Tachycardia    episodes with near syncope eval dr Nadyne Coombes  on no meds dced LABA    Past Surgical History:  Procedure Laterality Date  . BIOPSY  09/03/2018   Procedure: BIOPSY;  Surgeon: Jerene Bears, MD;  [facility];  Service: Gastroenterology;;  . broken right femur  2008  rod placed  . COLONOSCOPY WITH PROPOFOL N/A 09/03/2018   Procedure: COLONOSCOPY WITH PROPOFOL;  Surgeon: Jerene Bears, MD;  [facility];  Service: Gastroenterology;  Laterality: N/A;  . DILATION AND CURETTAGE OF UTERUS    . ESOPHAGOGASTRODUODENOSCOPY (EGD) WITH PROPOFOL N/A 09/03/2018   Procedure: ESOPHAGOGASTRODUODENOSCOPY (EGD) WITH PROPOFOL;  Surgeon: Jerene Bears, MD;  [facility];  Service: Gastroenterology;  Laterality: N/A;  . IR TRANSCATHETER BX  01/19/2019  . IR US GUIDE VASC ACCESS RIGHT  01/19/2019  . IR VENOGRAM HEPATIC W HEMODYNAMIC EVALUATION  01/19/2019  . OB ultrasound N/A 12/01/2017   see report  . POLYPECTOMY  09/03/2018   Procedure: POLYPECTOMY;   Surgeon: Jerene Bears, MD;  Location: [facility];  Service: Gastroenterology;;  . Jacques Earthly  . WISDOM TOOTH EXTRACTION      MEDICATIONS: . acetaminophen (TYLENOL) 500 MG tablet  . albuterol (PROVENTIL) (2.5 MG/3ML) 0.083% nebulizer solution  . albuterol (VENTOLIN HFA) 108 (90 Base) MCG/ACT inhaler  . ARIPiprazole (ABILIFY) 10 MG tablet  . Ascorbic Acid (VITAMIN C WITH ROSE HIPS) 500 MG tablet  . Beclomethasone Dipropionate (QNASL) 80 MCG/ACT AERS  . calcium carbonate (OSCAL) 1500 (600 Ca) MG TABS tablet  . cetirizine (ZYRTEC) 5 MG tablet  . Cholecalciferol (VITAMIN D-3) 125 MCG (5000 UT) TABS  . dicyclomine (BENTYL) 20 MG tablet  . diphenoxylate-atropine (LOMOTIL) 2.5-0.025 MG tablet  . Dupilumab (DUPIXENT) 300 MG/2ML SOSY  . esomeprazole (NEXIUM) 40 MG capsule  . ferrous sulfate 325 (65 FE) MG tablet  . gabapentin (NEURONTIN) 100 MG capsule  . gabapentin (NEURONTIN) 300 MG capsule  . lamoTRIgine (LAMICTAL) 150 MG tablet  . levothyroxine (SYNTHROID) 88 MCG tablet  . loperamide (IMODIUM) 2 MG capsule  . LORazepam (ATIVAN) 0.5 MG tablet  . montelukast (SINGULAIR) 10 MG tablet  . olopatadine (PATANOL) 0.1 % ophthalmic solution  . ondansetron (ZOFRAN) 8 MG tablet  . Prenatal Vit-Fe Fumarate-FA (MULTIVITAMIN-PRENATAL) 27-0.8 MG TABS tablet  . promethazine (PHENERGAN) 25 MG tablet  . SYMBICORT 160-4.5 MCG/ACT inhaler  . topiramate (TOPAMAX) 50 MG tablet  . vitamin E 400 UNIT capsule   No current facility-administered medications for this encounter.   Derrill Memo ON 06/28/2019] bupivacaine liposome (EXPAREL) 1.3 % injection 266 mg    Maia Plan [facility] Pre-Surgical Testing [phone] 06/27/19  1:57 PM

## 2019-06-27 NOTE — Patient Outreach (Signed)
[facility]) Care Management  06/27/2019  Linda Becker 1991/05/31 [id number]   Preoperative Screening Call Referral received: 06/23/19 Surgery/Procedure date: 06/28/19 Initial outreach: 06/27/19 Insurance: [place]   Initial unsuccessful telephone call to patient's preferred number in order to complete  preoperative screening; no answer, left HIPAA compliant voicemail message requesting return call.   Objective: Per the electronic medical record, Linda Becker  is scheduled for Laparoscopic gastric sleeve resection 1/11 /21 at [facility]. She completed her pre-operative admission testing visit on 06/24/19  . Comorbidities include: Portal hypertension, Morbid Obesity, GERD   Plan: If no return call from patient will close case , after discharge from the hospital patient will receive EMMI interactive voice response calls to assess for discharge needs.    [name], RN, [place] Management Coordinator  [phone]- Mobile [phone]- Toll Free Main Office

## 2019-06-28 ENCOUNTER — Inpatient Hospital Stay (HOSPITAL_COMMUNITY)
Admission: RE | Admit: 2019-06-28 | Discharge: 2019-06-29 | DRG: 620 | Disposition: A | Discharge status: Home / Self Care | Payer: 59 | Attending: Surgery | Admitting: Surgery

## 2019-06-28 ENCOUNTER — Inpatient Hospital Stay (HOSPITAL_COMMUNITY): Payer: 59 | Admitting: Certified Registered Nurse Anesthetist

## 2019-06-28 ENCOUNTER — Encounter (HOSPITAL_COMMUNITY)
Admission: RE | Disposition: A | Discharge status: Home / Self Care | Payer: Self-pay | Source: Home / Self Care | Attending: Surgery

## 2019-06-28 ENCOUNTER — Other Ambulatory Visit: Payer: Self-pay

## 2019-06-28 ENCOUNTER — Inpatient Hospital Stay (HOSPITAL_COMMUNITY): Payer: 59 | Admitting: Physician Assistant

## 2019-06-28 ENCOUNTER — Encounter (HOSPITAL_COMMUNITY): Payer: Self-pay | Admitting: Surgery

## 2019-06-28 DIAGNOSIS — Z841 Family history of disorders of kidney and ureter: Secondary | ICD-10-CM | POA: Diagnosis not present

## 2019-06-28 DIAGNOSIS — K449 Diaphragmatic hernia without obstruction or gangrene: Secondary | ICD-10-CM | POA: Diagnosis present

## 2019-06-28 DIAGNOSIS — K219 Gastro-esophageal reflux disease without esophagitis: Secondary | ICD-10-CM | POA: Diagnosis present

## 2019-06-28 DIAGNOSIS — D6959 Other secondary thrombocytopenia: Secondary | ICD-10-CM | POA: Diagnosis present

## 2019-06-28 DIAGNOSIS — Z803 Family history of malignant neoplasm of breast: Secondary | ICD-10-CM

## 2019-06-28 DIAGNOSIS — J45909 Unspecified asthma, uncomplicated: Secondary | ICD-10-CM | POA: Diagnosis present

## 2019-06-28 DIAGNOSIS — K7402 Hepatic fibrosis, advanced fibrosis: Secondary | ICD-10-CM | POA: Diagnosis present

## 2019-06-28 DIAGNOSIS — E785 Hyperlipidemia, unspecified: Secondary | ICD-10-CM | POA: Diagnosis present

## 2019-06-28 DIAGNOSIS — Z833 Family history of diabetes mellitus: Secondary | ICD-10-CM | POA: Diagnosis not present

## 2019-06-28 DIAGNOSIS — Z888 Allergy status to other drugs, medicaments and biological substances status: Secondary | ICD-10-CM

## 2019-06-28 DIAGNOSIS — K7581 Nonalcoholic steatohepatitis (NASH): Secondary | ICD-10-CM | POA: Diagnosis present

## 2019-06-28 DIAGNOSIS — Z79890 Hormone replacement therapy (postmenopausal): Secondary | ICD-10-CM | POA: Diagnosis not present

## 2019-06-28 DIAGNOSIS — Z825 Family history of asthma and other chronic lower respiratory diseases: Secondary | ICD-10-CM

## 2019-06-28 DIAGNOSIS — Z800 Family history of malignant neoplasm of digestive organs: Secondary | ICD-10-CM

## 2019-06-28 DIAGNOSIS — K766 Portal hypertension: Secondary | ICD-10-CM | POA: Diagnosis not present

## 2019-06-28 DIAGNOSIS — Z7951 Long term (current) use of inhaled steroids: Secondary | ICD-10-CM

## 2019-06-28 DIAGNOSIS — K580 Irritable bowel syndrome with diarrhea: Secondary | ICD-10-CM | POA: Diagnosis present

## 2019-06-28 DIAGNOSIS — Z8249 Family history of ischemic heart disease and other diseases of the circulatory system: Secondary | ICD-10-CM

## 2019-06-28 DIAGNOSIS — Z6841 Body Mass Index (BMI) 40.0 and over, adult: Secondary | ICD-10-CM

## 2019-06-28 DIAGNOSIS — Z880 Allergy status to penicillin: Secondary | ICD-10-CM

## 2019-06-28 DIAGNOSIS — E039 Hypothyroidism, unspecified: Secondary | ICD-10-CM | POA: Diagnosis present

## 2019-06-28 DIAGNOSIS — I8510 Secondary esophageal varices without bleeding: Secondary | ICD-10-CM | POA: Diagnosis present

## 2019-06-28 DIAGNOSIS — Z79899 Other long term (current) drug therapy: Secondary | ICD-10-CM

## 2019-06-28 DIAGNOSIS — I10 Essential (primary) hypertension: Secondary | ICD-10-CM | POA: Diagnosis not present

## 2019-06-28 HISTORY — PX: LAPAROSCOPIC GASTRIC SLEEVE RESECTION: SHX5895

## 2019-06-28 LAB — PREGNANCY, URINE: Preg Test, Ur: NEGATIVE

## 2019-06-28 LAB — TYPE AND SCREEN
ABO/RH(D): O POS
Antibody Screen: NEGATIVE

## 2019-06-28 LAB — HEMOGLOBIN AND HEMATOCRIT, BLOOD
HCT: 45.4 % (ref 36.0–46.0)
Hemoglobin: 14.2 g/dL (ref 12.0–15.0)

## 2019-06-28 SURGERY — GASTRECTOMY, SLEEVE, LAPAROSCOPIC
Anesthesia: General | Site: Abdomen

## 2019-06-28 MED ORDER — LORAZEPAM 0.5 MG PO TABS: 0.5000 mg | ORAL_TABLET | Freq: Two times a day (BID) | ORAL | Status: DC | PRN

## 2019-06-28 MED ORDER — PROPOFOL 500 MG/50ML IV EMUL
INTRAVENOUS | Status: DC | PRN
  Administered 2019-06-28: 30 ug/kg/min via INTRAVENOUS

## 2019-06-28 MED ORDER — LIDOCAINE 2% (20 MG/ML) 5 ML SYRINGE
INTRAMUSCULAR | Status: AC
  Filled 2019-06-28: qty 5

## 2019-06-28 MED ORDER — DEXAMETHASONE SODIUM PHOSPHATE 10 MG/ML IJ SOLN
INTRAMUSCULAR | Status: DC | PRN
  Administered 2019-06-28: 6 mg via INTRAVENOUS

## 2019-06-28 MED ORDER — GABAPENTIN 100 MG PO CAPS: 200.0000 mg | ORAL_CAPSULE | Freq: Two times a day (BID) | ORAL | Status: DC

## 2019-06-28 MED ORDER — PROPOFOL 10 MG/ML IV BOLUS
INTRAVENOUS | Status: DC | PRN
  Administered 2019-06-28: 200 mg via INTRAVENOUS

## 2019-06-28 MED ORDER — HYDROMORPHONE HCL 1 MG/ML IJ SOLN
0.2500 mg | INTRAMUSCULAR | Status: DC | PRN
  Administered 2019-06-28 (×4): 0.5 mg via INTRAVENOUS

## 2019-06-28 MED ORDER — BUPIVACAINE HCL (PF) 0.25 % IJ SOLN
INTRAMUSCULAR | Status: AC
  Filled 2019-06-28: qty 30

## 2019-06-28 MED ORDER — GABAPENTIN 300 MG PO CAPS
300.0000 mg | ORAL_CAPSULE | ORAL | Status: AC
  Administered 2019-06-28: 300 mg via ORAL
  Filled 2019-06-28: qty 1

## 2019-06-28 MED ORDER — LACTATED RINGERS IV SOLN: INTRAVENOUS | Status: DC

## 2019-06-28 MED ORDER — PHENYLEPHRINE 40 MCG/ML (10ML) SYRINGE FOR IV PUSH (FOR BLOOD PRESSURE SUPPORT)
PREFILLED_SYRINGE | INTRAVENOUS | Status: AC
  Filled 2019-06-28: qty 10

## 2019-06-28 MED ORDER — PANTOPRAZOLE SODIUM 40 MG IV SOLR
40.0000 mg | Freq: Every day | INTRAVENOUS | Status: DC
  Administered 2019-06-28: 40 mg via INTRAVENOUS
  Filled 2019-06-28: qty 40

## 2019-06-28 MED ORDER — ENOXAPARIN SODIUM 30 MG/0.3ML ~~LOC~~ SOLN
30.0000 mg | Freq: Two times a day (BID) | SUBCUTANEOUS | Status: DC
  Administered 2019-06-28 – 2019-06-29 (×2): 30 mg via SUBCUTANEOUS
  Filled 2019-06-28 (×2): qty 0.3

## 2019-06-28 MED ORDER — 0.9 % SODIUM CHLORIDE (POUR BTL) OPTIME
TOPICAL | Status: DC | PRN
  Administered 2019-06-28: 11:00:00 1000 mL

## 2019-06-28 MED ORDER — SCOPOLAMINE 1 MG/3DAYS TD PT72
1.0000 | MEDICATED_PATCH | TRANSDERMAL | Status: DC
  Administered 2019-06-28: 1.5 mg via TRANSDERMAL
  Filled 2019-06-28: qty 1

## 2019-06-28 MED ORDER — ALBUTEROL SULFATE HFA 108 (90 BASE) MCG/ACT IN AERS: 2.0000 | INHALATION_SPRAY | RESPIRATORY_TRACT | Status: DC | PRN

## 2019-06-28 MED ORDER — ARIPIPRAZOLE 10 MG PO TABS
10.0000 mg | ORAL_TABLET | Freq: Every day | ORAL | Status: DC
  Administered 2019-06-28: 10 mg via ORAL
  Filled 2019-06-28 (×2): qty 1

## 2019-06-28 MED ORDER — SODIUM CHLORIDE 0.9 % IV SOLN
2.0000 g | INTRAVENOUS | Status: AC
  Administered 2019-06-28: 2 g via INTRAVENOUS
  Filled 2019-06-28: qty 2

## 2019-06-28 MED ORDER — HYDROMORPHONE HCL 1 MG/ML IJ SOLN
0.5000 mg | INTRAMUSCULAR | Status: DC | PRN
  Administered 2019-06-28 – 2019-06-29 (×4): 0.5 mg via INTRAVENOUS
  Filled 2019-06-28 (×4): qty 0.5

## 2019-06-28 MED ORDER — METHOCARBAMOL 500 MG IVPB - SIMPLE MED
INTRAVENOUS | Status: AC
  Filled 2019-06-28: qty 50

## 2019-06-28 MED ORDER — FENTANYL CITRATE (PF) 100 MCG/2ML IJ SOLN
INTRAMUSCULAR | Status: DC | PRN
  Administered 2019-06-28: 100 ug via INTRAVENOUS
  Administered 2019-06-28 (×3): 50 ug via INTRAVENOUS

## 2019-06-28 MED ORDER — PROPOFOL 10 MG/ML IV BOLUS
INTRAVENOUS | Status: AC
  Filled 2019-06-28: qty 20

## 2019-06-28 MED ORDER — PROMETHAZINE HCL 25 MG/ML IJ SOLN
6.2500 mg | INTRAMUSCULAR | Status: AC | PRN
  Administered 2019-06-28 (×2): 6.25 mg via INTRAVENOUS

## 2019-06-28 MED ORDER — SUGAMMADEX SODIUM 500 MG/5ML IV SOLN
INTRAVENOUS | Status: AC
  Filled 2019-06-28: qty 5

## 2019-06-28 MED ORDER — ROCURONIUM BROMIDE 10 MG/ML (PF) SYRINGE
PREFILLED_SYRINGE | INTRAVENOUS | Status: AC
  Filled 2019-06-28: qty 10

## 2019-06-28 MED ORDER — LIDOCAINE 2% (20 MG/ML) 5 ML SYRINGE
INTRAMUSCULAR | Status: DC | PRN
  Administered 2019-06-28: 100 mg via INTRAVENOUS

## 2019-06-28 MED ORDER — ACETAMINOPHEN 160 MG/5ML PO SOLN
1000.0000 mg | Freq: Three times a day (TID) | ORAL | Status: DC
  Filled 2019-06-28 (×2): qty 40.6

## 2019-06-28 MED ORDER — HYDROMORPHONE HCL 1 MG/ML IJ SOLN
INTRAMUSCULAR | Status: AC
  Filled 2019-06-28: qty 1

## 2019-06-28 MED ORDER — ALBUTEROL SULFATE (2.5 MG/3ML) 0.083% IN NEBU: 2.5000 mg | INHALATION_SOLUTION | RESPIRATORY_TRACT | Status: DC | PRN

## 2019-06-28 MED ORDER — MIDAZOLAM HCL 5 MG/5ML IJ SOLN
INTRAMUSCULAR | Status: DC | PRN
  Administered 2019-06-28: 2 mg via INTRAVENOUS

## 2019-06-28 MED ORDER — LAMOTRIGINE 150 MG PO TABS
150.0000 mg | ORAL_TABLET | Freq: Every day | ORAL | Status: DC
  Administered 2019-06-29: 150 mg via ORAL
  Filled 2019-06-28 (×2): qty 1

## 2019-06-28 MED ORDER — ACETAMINOPHEN 500 MG PO TABS
1000.0000 mg | ORAL_TABLET | Freq: Three times a day (TID) | ORAL | Status: DC
  Administered 2019-06-28 – 2019-06-29 (×3): 1000 mg via ORAL
  Filled 2019-06-28 (×2): qty 2

## 2019-06-28 MED ORDER — ONDANSETRON HCL 4 MG/2ML IJ SOLN
INTRAMUSCULAR | Status: DC | PRN
  Administered 2019-06-28: 4 mg via INTRAVENOUS

## 2019-06-28 MED ORDER — ALBUTEROL SULFATE HFA 108 (90 BASE) MCG/ACT IN AERS
INHALATION_SPRAY | RESPIRATORY_TRACT | Status: AC
  Filled 2019-06-28: qty 6.7

## 2019-06-28 MED ORDER — METHOCARBAMOL 500 MG IVPB - SIMPLE MED
500.0000 mg | Freq: Four times a day (QID) | INTRAVENOUS | Status: DC | PRN
  Administered 2019-06-28 – 2019-06-29 (×2): 500 mg via INTRAVENOUS
  Filled 2019-06-28: qty 50

## 2019-06-28 MED ORDER — EPHEDRINE SULFATE-NACL 50-0.9 MG/10ML-% IV SOSY
PREFILLED_SYRINGE | INTRAVENOUS | Status: DC | PRN
  Administered 2019-06-28: 10 mg via INTRAVENOUS

## 2019-06-28 MED ORDER — ALBUTEROL SULFATE HFA 108 (90 BASE) MCG/ACT IN AERS
INHALATION_SPRAY | RESPIRATORY_TRACT | Status: DC | PRN
  Administered 2019-06-28: 2 via RESPIRATORY_TRACT

## 2019-06-28 MED ORDER — SUGAMMADEX SODIUM 500 MG/5ML IV SOLN
INTRAVENOUS | Status: DC | PRN
  Administered 2019-06-28: 300 mg via INTRAVENOUS

## 2019-06-28 MED ORDER — MIDAZOLAM HCL 2 MG/2ML IJ SOLN
INTRAMUSCULAR | Status: AC
  Filled 2019-06-28: qty 2

## 2019-06-28 MED ORDER — KETAMINE HCL 10 MG/ML IJ SOLN
INTRAMUSCULAR | Status: DC | PRN
  Administered 2019-06-28: 30 mg via INTRAVENOUS

## 2019-06-28 MED ORDER — METOPROLOL TARTRATE 5 MG/5ML IV SOLN: 5.0000 mg | Freq: Four times a day (QID) | INTRAVENOUS | Status: DC | PRN

## 2019-06-28 MED ORDER — HYDRALAZINE HCL 20 MG/ML IJ SOLN: 10.0000 mg | INTRAMUSCULAR | Status: DC | PRN

## 2019-06-28 MED ORDER — APREPITANT 40 MG PO CAPS
40.0000 mg | ORAL_CAPSULE | ORAL | Status: AC
  Administered 2019-06-28: 40 mg via ORAL
  Filled 2019-06-28: qty 1

## 2019-06-28 MED ORDER — BUPIVACAINE LIPOSOME 1.3 % IJ SUSP
INTRAMUSCULAR | Status: DC | PRN
  Administered 2019-06-28: 20 mL

## 2019-06-28 MED ORDER — ENSURE MAX PROTEIN PO LIQD
2.0000 [oz_av] | ORAL | Status: DC
  Administered 2019-06-29 (×2): 2 [oz_av] via ORAL

## 2019-06-28 MED ORDER — FENTANYL CITRATE (PF) 250 MCG/5ML IJ SOLN
INTRAMUSCULAR | Status: AC
  Filled 2019-06-28: qty 5

## 2019-06-28 MED ORDER — PROMETHAZINE HCL 25 MG/ML IJ SOLN
INTRAMUSCULAR | Status: AC
  Filled 2019-06-28: qty 1

## 2019-06-28 MED ORDER — SIMETHICONE 80 MG PO CHEW
80.0000 mg | CHEWABLE_TABLET | Freq: Four times a day (QID) | ORAL | Status: DC | PRN
  Administered 2019-06-29: 80 mg via ORAL
  Filled 2019-06-28 (×2): qty 1

## 2019-06-28 MED ORDER — ONDANSETRON HCL 4 MG/2ML IJ SOLN
INTRAMUSCULAR | Status: AC
  Filled 2019-06-28: qty 2

## 2019-06-28 MED ORDER — CHLORHEXIDINE GLUCONATE CLOTH 2 % EX PADS: 6.0000 | MEDICATED_PAD | Freq: Once | CUTANEOUS | Status: DC

## 2019-06-28 MED ORDER — MOMETASONE FURO-FORMOTEROL FUM 200-5 MCG/ACT IN AERO
2.0000 | INHALATION_SPRAY | Freq: Two times a day (BID) | RESPIRATORY_TRACT | Status: DC
  Filled 2019-06-28: qty 8.8

## 2019-06-28 MED ORDER — LIDOCAINE 20MG/ML (2%) 15 ML SYRINGE OPTIME
INTRAMUSCULAR | Status: DC | PRN
  Administered 2019-06-28: 1.5 mg/kg/h via INTRAVENOUS

## 2019-06-28 MED ORDER — BUPIVACAINE HCL (PF) 0.25 % IJ SOLN
INTRAMUSCULAR | Status: DC | PRN
  Administered 2019-06-28: 30 mL

## 2019-06-28 MED ORDER — DEXAMETHASONE SODIUM PHOSPHATE 10 MG/ML IJ SOLN
INTRAMUSCULAR | Status: AC
  Filled 2019-06-28: qty 1

## 2019-06-28 MED ORDER — OXYCODONE HCL 5 MG/5ML PO SOLN
5.0000 mg | Freq: Four times a day (QID) | ORAL | Status: DC | PRN
  Administered 2019-06-28 – 2019-06-29 (×2): 5 mg via ORAL
  Filled 2019-06-28 (×2): qty 5

## 2019-06-28 MED ORDER — ROCURONIUM BROMIDE 50 MG/5ML IV SOSY
PREFILLED_SYRINGE | INTRAVENOUS | Status: DC | PRN
  Administered 2019-06-28: 10 mg via INTRAVENOUS
  Administered 2019-06-28: 20 mg via INTRAVENOUS
  Administered 2019-06-28: 10 mg via INTRAVENOUS
  Administered 2019-06-28: 70 mg via INTRAVENOUS

## 2019-06-28 MED ORDER — SODIUM CHLORIDE 0.9 % IV SOLN: INTRAVENOUS | Status: DC

## 2019-06-28 MED ORDER — LACTATED RINGERS IR SOLN
Status: DC | PRN
  Administered 2019-06-28: 1000 mL

## 2019-06-28 MED ORDER — DOCUSATE SODIUM 100 MG PO CAPS
100.0000 mg | ORAL_CAPSULE | Freq: Two times a day (BID) | ORAL | Status: DC
  Administered 2019-06-28 – 2019-06-29 (×2): 100 mg via ORAL
  Filled 2019-06-28 (×2): qty 1

## 2019-06-28 MED ORDER — ACETAMINOPHEN 500 MG PO TABS
1000.0000 mg | ORAL_TABLET | ORAL | Status: AC
  Administered 2019-06-28: 1000 mg via ORAL
  Filled 2019-06-28: qty 2

## 2019-06-28 MED ORDER — METOCLOPRAMIDE HCL 5 MG/ML IJ SOLN
10.0000 mg | Freq: Four times a day (QID) | INTRAMUSCULAR | Status: DC
  Administered 2019-06-28 – 2019-06-29 (×4): 10 mg via INTRAVENOUS
  Filled 2019-06-28 (×4): qty 2

## 2019-06-28 MED ORDER — TOPIRAMATE 25 MG PO TABS
50.0000 mg | ORAL_TABLET | Freq: Two times a day (BID) | ORAL | Status: DC
  Administered 2019-06-28 – 2019-06-29 (×2): 50 mg via ORAL
  Filled 2019-06-28 (×2): qty 2

## 2019-06-28 MED ORDER — ONDANSETRON HCL 4 MG/2ML IJ SOLN
4.0000 mg | INTRAMUSCULAR | Status: DC | PRN
  Administered 2019-06-28 – 2019-06-29 (×3): 4 mg via INTRAVENOUS
  Filled 2019-06-28 (×3): qty 2

## 2019-06-28 MED ORDER — LEVOTHYROXINE SODIUM 88 MCG PO TABS
88.0000 ug | ORAL_TABLET | Freq: Every day | ORAL | Status: DC
  Administered 2019-06-29: 88 ug via ORAL
  Filled 2019-06-28: qty 1

## 2019-06-28 SURGICAL SUPPLY — 65 items
APPLICATOR COTTON TIP 6 STRL (MISCELLANEOUS) IMPLANT
APPLICATOR COTTON TIP 6IN STRL (MISCELLANEOUS)
APPLIER CLIP ROT 10 11.4 M/L (STAPLE) ×2
APPLIER CLIP ROT 13.4 12 LRG (CLIP)
BAG LAPAROSCOPIC 12 15 PORT 16 (BASKET) ×1 IMPLANT
BAG RETRIEVAL 12/15 (BASKET) ×2
BENZOIN TINCTURE PRP APPL 2/3 (GAUZE/BANDAGES/DRESSINGS) ×2 IMPLANT
BLADE SURG SZ11 CARB STEEL (BLADE) ×2 IMPLANT
BNDG ADH 1X3 SHEER STRL LF (GAUZE/BANDAGES/DRESSINGS) ×2 IMPLANT
CABLE HIGH FREQUENCY MONO STRZ (ELECTRODE) ×2 IMPLANT
CHLORAPREP W/TINT 26 (MISCELLANEOUS) ×4 IMPLANT
CLIP APPLIE ROT 10 11.4 M/L (STAPLE) ×1 IMPLANT
CLIP APPLIE ROT 13.4 12 LRG (CLIP) IMPLANT
COVER SURGICAL LIGHT HANDLE (MISCELLANEOUS) ×2 IMPLANT
COVER WAND RF STERILE (DRAPES) IMPLANT
DECANTER SPIKE VIAL GLASS SM (MISCELLANEOUS) ×2 IMPLANT
DEVICE SUT QUICK LOAD TK 5 (STAPLE) IMPLANT
DEVICE SUT TI-KNOT TK 5X26 (MISCELLANEOUS) IMPLANT
DRAPE UTILITY XL STRL (DRAPES) ×4 IMPLANT
ELECT REM PT RETURN 15FT ADLT (MISCELLANEOUS) ×2 IMPLANT
GAUZE SPONGE 4X4 12PLY STRL (GAUZE/BANDAGES/DRESSINGS) IMPLANT
GLOVE BIO SURGEON STRL SZ 6 (GLOVE) ×2 IMPLANT
GLOVE INDICATOR 6.5 STRL GRN (GLOVE) ×2 IMPLANT
GOWN STRL REUS W/TWL LRG LVL3 (GOWN DISPOSABLE) ×2 IMPLANT
GOWN STRL REUS W/TWL XL LVL3 (GOWN DISPOSABLE) ×4 IMPLANT
GRASPER SUT TROCAR 14GX15 (MISCELLANEOUS) ×2 IMPLANT
HOVERMATT SINGLE USE (MISCELLANEOUS) ×2 IMPLANT
KIT BASIN OR (CUSTOM PROCEDURE TRAY) ×2 IMPLANT
KIT TURNOVER KIT A (KITS) IMPLANT
MARKER SKIN DUAL TIP RULER LAB (MISCELLANEOUS) ×2 IMPLANT
NEEDLE SPNL 22GX3.5 QUINCKE BK (NEEDLE) ×2 IMPLANT
PACK UNIVERSAL I (CUSTOM PROCEDURE TRAY) ×2 IMPLANT
PENCIL SMOKE EVACUATOR (MISCELLANEOUS) IMPLANT
RELOAD ENDO STITCH (ENDOMECHANICALS) IMPLANT
RELOAD STAPLER BLUE 60MM (STAPLE) ×3 IMPLANT
RELOAD STAPLER GOLD 60MM (STAPLE) ×1 IMPLANT
RELOAD STAPLER GREEN 60MM (STAPLE) ×1 IMPLANT
SCISSORS LAP 5X45 EPIX DISP (ENDOMECHANICALS) ×2 IMPLANT
SET IRRIG TUBING LAPAROSCOPIC (IRRIGATION / IRRIGATOR) ×2 IMPLANT
SET TUBE SMOKE EVAC HIGH FLOW (TUBING) ×2 IMPLANT
SHEARS HARMONIC ACE PLUS 45CM (MISCELLANEOUS) ×2 IMPLANT
SLEEVE ADV FIXATION 5X100MM (TROCAR) ×4 IMPLANT
SLEEVE GASTRECTOMY 40FR VISIGI (MISCELLANEOUS) ×2 IMPLANT
SOL ANTI FOG 6CC (MISCELLANEOUS) ×1 IMPLANT
SOLUTION ANTI FOG 6CC (MISCELLANEOUS) ×1
SPONGE LAP 18X18 RF (DISPOSABLE) ×2 IMPLANT
STAPLER ECHELON BIOABSB 60 FLE (MISCELLANEOUS) ×10 IMPLANT
STAPLER ECHELON LONG 60 440 (INSTRUMENTS) ×2 IMPLANT
STAPLER RELOAD BLUE 60MM (STAPLE) ×6
STAPLER RELOAD GOLD 60MM (STAPLE) ×2
STAPLER RELOAD GREEN 60MM (STAPLE) ×2
STRIP CLOSURE SKIN 1/2X4 (GAUZE/BANDAGES/DRESSINGS) ×2 IMPLANT
SUT MNCRL AB 4-0 PS2 18 (SUTURE) ×2 IMPLANT
SUT SURGIDAC NAB ES-9 0 48 120 (SUTURE) IMPLANT
SUT VICRYL 0 TIES 12 18 (SUTURE) ×2 IMPLANT
SYR 10ML ECCENTRIC (SYRINGE) ×2 IMPLANT
SYR 20ML LL LF (SYRINGE) ×2 IMPLANT
SYR 50ML LL SCALE MARK (SYRINGE) ×2 IMPLANT
TOWEL OR 17X26 10 PK STRL BLUE (TOWEL DISPOSABLE) ×2 IMPLANT
TOWEL OR NON WOVEN STRL DISP B (DISPOSABLE) ×2 IMPLANT
TROCAR ADV FIXATION 5X100MM (TROCAR) ×2 IMPLANT
TROCAR BLADELESS 15MM (ENDOMECHANICALS) ×2 IMPLANT
TROCAR BLADELESS OPT 5 100 (ENDOMECHANICALS) ×2 IMPLANT
TUBING CONNECTING 10 (TUBING) ×2 IMPLANT
TUBING ENDO SMARTCAP (MISCELLANEOUS) ×2 IMPLANT

## 2019-06-28 NOTE — Discharge Instructions (Signed)
° ° ° °GASTRIC BYPASS/SLEEVE ° Home Care Instructions ° ° These instructions are to help you care for yourself when you go home. ° °Call: If you have any problems. °• Call [phone] and ask for the surgeon on call °• If you need immediate help, come to the ER at [place].  °• Tell the ER staff that you are a new post-op gastric bypass or gastric sleeve patient °  °Signs and symptoms to report: • Severe vomiting or nausea °o If you cannot keep down clear liquids for longer than 1 day, call your surgeon  °• Abdominal pain that does not get better after taking your pain medication °• Fever over 100.4° F with chills °• Heart beating over 100 beats a minute °• Shortness of breath at rest °• Chest pain °•  Redness, swelling, drainage, or foul odor at incision (surgical) sites °•  If your incisions open or pull apart °• Swelling or pain in calf (lower leg) °• Diarrhea (Loose bowel movements that happen often), frequent watery, uncontrolled bowel movements °• Constipation, (no bowel movements for 3 days) if this happens: Pick one °o Milk of Magnesia, 2 tablespoons by mouth, 3 times a day for 2 days if needed °o Stop taking Milk of Magnesia once you have a bowel movement °o Call your doctor if constipation continues °Or °o Miralax  (instead of Milk of Magnesia) following the label instructions °o Stop taking Miralax once you have a bowel movement °o Call your doctor if constipation continues °• Anything you think is not normal °  °Normal side effects after surgery: • Unable to sleep at night or unable to focus °• Irritability or moody °• Being tearful (crying) or depressed °These are common complaints, possibly related to your anesthesia medications that put you to sleep, stress of surgery, and change in lifestyle.  This usually goes away a few weeks after surgery.  If these feelings continue, call your primary care doctor. °  °Wound Care: You may have surgical glue, steri-strips, or staples over your incisions after  surgery °• Surgical glue:  Looks like a clear film over your incisions and will wear off a little at a time °• Steri-strips: Strips of tape over your incisions. You may notice a yellowish color on the skin under the steri-strips. This is used to make the   steri-strips stick better. Do not pull the steri-strips off - let them fall off °• Staples: Staples may be removed before you leave the hospital °o If you go home with staples, call [facility], [phone] at for an appointment with your surgeon’s nurse to have staples removed 10 days after surgery. °• Showering: You may shower two (2) days after your surgery unless your surgeon tells you differently °o Wash gently around incisions with warm soapy water, rinse well, and gently pat dry  °o No tub baths until staples are removed, steri-strips fall off or glue is gone.  °  °Medications: • Medications should be liquid or crushed if larger than the size of a dime °• Extended release pills (medication that release a little bit at a time through the day) should NOT be crushed or cut. (examples include XL, ER, DR, SR) °• Depending on the size and number of medications you take, you may need to space (take a few throughout the day)/change the time you take your medications so that you do not over-fill your pouch (smaller stomach) °• Make sure you follow-up with your primary care doctor to   make medication changes needed during rapid weight loss and life-style changes °• If you have diabetes, follow up with the doctor that orders your diabetes medication(s) within one week after surgery and check your blood sugar regularly. °• Do not drive while taking prescription pain medication  °• It is ok to take Tylenol by the bottle instructions with your pain medicine or instead of your pain medicine as needed.  DO NOT TAKE NSAIDS (EXAMPLES OF NSAIDS:  IBUPROFREN/ NAPROXEN)  °Diet:                    First 2 Weeks ° You will see the dietician t about two (2) weeks  after your surgery. The dietician will increase the types of foods you can eat if you are handling liquids well: °• If you have severe vomiting or nausea and cannot keep down clear liquids lasting longer than 1 day, call your surgeon @ ([phone]) °Protein Shake °• Drink at least 2 ounces of shake 5-6 times per day °• Each serving of protein shakes (usually 8 - 12 ounces) should have: °o 15 grams of protein  °o And no more than 5 grams of carbohydrate  °• Goal for protein each day: °o Men = 80 grams per day °o Women = 60 grams per day °• Protein powder may be added to fluids such as non-fat milk or Lactaid milk or unsweetened Soy/Almond milk (limit to 35 grams added protein powder per serving) ° °Hydration °• Slowly increase the amount of water and other clear liquids as tolerated (See Acceptable Fluids) °• Slowly increase the amount of protein shake as tolerated  °•  Sip fluids slowly and throughout the day.  Do not use straws. °• May use sugar substitutes in small amounts (no more than 6 - 8 packets per day; i.e. Splenda) ° °Fluid Goal °• The first goal is to drink at least 8 ounces of protein shake/drink per day (or as directed by the nutritionist); some examples of protein shakes are Syntrax Nectar, Adkins Advantage, EAS Edge HP, and Unjury. See handout from pre-op Bariatric Education Class: °o Slowly increase the amount of protein shake you drink as tolerated °o You may find it easier to slowly sip shakes throughout the day °o It is important to get your proteins in first °• Your fluid goal is to drink 64 - 100 ounces of fluid daily °o It may take a few weeks to build up to this °• 32 oz (or more) should be clear liquids  °And  °• 32 oz (or more) should be full liquids (see below for examples) °• Liquids should not contain sugar, caffeine, or carbonation ° °Clear Liquids: °• Water or Sugar-free flavored water (i.e. Fruit H2O, Propel) °• Decaffeinated coffee or tea (sugar-free) °• Crystal Lite, Wyler’s Lite,  Minute Maid Lite °• Sugar-free Jell-O °• Bouillon or broth °• Sugar-free Popsicle:   *Less than 20 calories each; Limit 1 per day ° °Full Liquids: °Protein Shakes/Drinks + 2 choices per day of other full liquids °• Full liquids must be: °o No More Than 15 grams of Carbs per serving  °o No More Than 3 grams of Fat per serving °• Strained low-fat cream soup (except Cream of Potato or Tomato) °• Non-Fat milk °• Fat-free Lactaid Milk °• Unsweetened Soy Or Unsweetened Almond Milk °• Low Sugar yogurt (Dannon Lite & Fit, Greek yogurt; Oikos Triple Zero; Chobani Simply 100; Yoplait 100 calorie Greek - No Fruit on the Bottom) ° °  °Vitamins   and Minerals • Start 1 day after surgery unless otherwise directed by your surgeon °• 2 Chewable Bariatric Specific Multivitamin / Multimineral Supplement with iron (Example: Bariatric Advantage Multi EA) °• Chewable Calcium with Vitamin D-3 °(Example: 3 Chewable Calcium Plus 600 with Vitamin D-3) °o Take 500 mg three (3) times a day for a total of 1500 mg each day °o Do not take all 3 doses of calcium at one time as it may cause constipation, and you can only absorb 500 mg  at a time  °o Do not mix multivitamins containing iron with calcium supplements; take 2 hours apart °• Menstruating women and those with a history of anemia (a blood disease that causes weakness) may need extra iron °o Talk with your doctor to see if you need more iron °• Do not stop taking or change any vitamins or minerals until you talk to your dietitian or surgeon °• Your Dietitian and/or surgeon must approve all vitamin and mineral supplements °  °Activity and Exercise: Limit your physical activity as instructed by your doctor.  It is important to continue walking at home.  During this time, use these guidelines: °• Do not lift anything greater than ten (10) pounds for at least two (2) weeks °• Do not go back to work or drive until your surgeon says you can °• You may have sex when you feel comfortable  °o It is  VERY important for female patients to use a reliable birth control method; fertility often increases after surgery  °o All hormonal birth control will be ineffective for 30 days after surgery due to medications given during surgery a barrier method must be used. °o Do not get pregnant for at least 18 months °• Start exercising as soon as your doctor tells you that you can °o Make sure your doctor approves any physical activity °• Start with a simple walking program °• Walk 5-15 minutes each day, 7 days per week.  °• Slowly increase until you are walking 30-45 minutes per day °Consider joining our BELT program. [phone] or email [email or web address] °  °Special Instructions Things to remember: °• Use your CPAP when sleeping if this applies to you ° °• [facility] has two free Bariatric Surgery Support Groups that meet monthly °o The 3rd Thursday of each month, 6 pm, [facility]  °o The 2nd Friday of each month, 11:45 am in the private dining room in the basement of [place] °• It is very important to keep all follow up appointments with your surgeon, dietitian, primary care physician, and behavioral health practitioner °• Routine follow up schedule with your surgeon include appointments at 2-3 weeks, 6-8 weeks, 6 months, and 1 year at a minimum.  Your surgeon may request to see you more often.   °o After the first year, please follow up with your bariatric surgeon and dietitian at least once a year in order to maintain best weight loss results °[facility]: [phone] °[facility]: [phone] °Bariatric Nurse Coordinator: [phone] °  °   Reviewed and Endorsed  °by [facility] Patient Education Committee, June, 2016 °Edits Approved: Aug, 2018 ° ° ° °

## 2019-06-28 NOTE — Progress Notes (Signed)
PHARMACY CONSULT FOR:  Risk Assessment for Post-Discharge VTE Following Bariatric Surgery  Post-Discharge VTE Risk Assessment: This patient's probability of 30-day post-discharge VTE is increased due to the factors marked:   Female    Age >/=60 years   X BMI >/=50 kg/m2    CHF    Dyspnea at Rest    Paraplegia  X  Non-gastric-band surgery    Operation Time >/=3 hr    Return to OR     Length of Stay >/= 3 d      Hx of VTE   Hypercoagulable condition   Significant venous stasis   Predicted probability of 30-day post-discharge VTE: 0.27%  Other patient-specific factors to consider: none   Recommendation for Discharge: No pharmacologic prophylaxis post-discharge  Linda Becker is a 29 y.o. female who underwent laparoscopic sleeve gastrectomy 06/28/2019   Allergies  Allergen Reactions  . Progesterone Rash    Was in a form of birth control.  . Penicillins Rash    Did it involve swelling of the face/tongue/throat, SOB, or low BP? No Did it involve sudden or severe rash/hives, skin peeling, or any reaction on the inside of your mouth or nose? Yes Did you need to seek medical attention at a hospital or doctor's office? No When did it last happen?9 + months If all above answers are "NO", may proceed with cephalosporin use.      Patient Measurements: Height: 5' 5"  (165.1 cm) Weight: (!) 302 lb (137 kg) IBW/kg (Calculated) : 57 Body mass index is 50.26 kg/m.  No results for input(s): WBC, HGB, HCT, PLT, APTT, CREATININE, LABCREA, CREATININE, CREAT24HRUR, MG, PHOS, ALBUMIN, PROT, ALBUMIN, AST, ALT, ALKPHOS, BILITOT, BILIDIR, IBILI in the last 72 hours. Estimated Creatinine Clearance: 149.7 mL/min (by C-G formula based on SCr of 0.79 mg/dL).    Past Medical History:  Diagnosis Date  . Allergic rhinitis    remote hx of allergy shots  . Asthma    since childhood  on controller meds extrinsic dr Carmelina Peal  . Family history of adverse reaction to anesthesia   .  Gallbladder sludge   . Gastropathy   . GERD (gastroesophageal reflux disease)    on nexium  for  long term sx since childhood  . H/O miscarriage, not currently pregnant    [redacted] weeks  march 2016  . Hepatic steatosis   . Hypothyroidism   . Migraine   . Murmur    pt reports MVP  . Sessile colonic polyp   . Splenomegaly   . Steatohepatitis   . Syncope    under eval ? cause  . Tachycardia    episodes with near syncope eval dr Nadyne Coombes  on no meds dced LABA     Medications Prior to Admission  Medication Sig Dispense Refill Last Dose  . acetaminophen (TYLENOL) 500 MG tablet Take 500 mg by mouth every 6 (six) hours as needed for moderate pain or headache.   Past Week at Unknown time  . albuterol (VENTOLIN HFA) 108 (90 Base) MCG/ACT inhaler Inhale 2 puffs into the lungs every 4 (four) hours as needed for wheezing or shortness of breath. 18 g 0 Past Week at Unknown time  . ARIPiprazole (ABILIFY) 10 MG tablet Take 10 mg by mouth at bedtime.    06/27/2019 at Unknown time  . Ascorbic Acid (VITAMIN C WITH ROSE HIPS) 500 MG tablet Take 500 mg by mouth 2 (two) times daily.   06/27/2019 at Unknown time  . Beclomethasone Dipropionate (QNASL) 80 MCG/ACT  AERS INSTILL 1 SPRAY IN EACH NOSTRIL ONCE DAILY AS DIRECTED (Patient taking differently: Place 1 spray into both nostrils daily. ) 10.6 g 3 06/27/2019 at Unknown time  . calcium carbonate (OSCAL) 1500 (600 Ca) MG TABS tablet Take 600 mg of elemental calcium by mouth 2 (two) times daily with a meal.   06/27/2019 at Unknown time  . cetirizine (ZYRTEC) 5 MG tablet Take 5 mg by mouth 2 (two) times daily.   06/27/2019 at Unknown time  . Cholecalciferol (VITAMIN D-3) 125 MCG (5000 UT) TABS Take 5,000 Units by mouth 2 (two) times daily.   06/27/2019 at Unknown time  . dicyclomine (BENTYL) 20 MG tablet Take 10 mg by mouth every 4-6 hours as needed. (Patient taking differently: Take 20 mg by mouth 2 (two) times daily as needed (abdominal spasms.). ) 120 tablet 3 06/27/2019 at  Unknown time  . Dupilumab (DUPIXENT) 300 MG/2ML SOSY Inject 300 mg into the skin every 14 (fourteen) days. 4 mL 11 Past Week at Unknown time  . esomeprazole (NEXIUM) 40 MG capsule TAKE 1 CAPSULE BY MOUTH TWO TIMES DAILY BEFORE A MEAL (Patient taking differently: Take 40 mg by mouth 2 (two) times daily. ) 60 capsule 4 06/28/2019 at 0500  . ferrous sulfate 325 (65 FE) MG tablet Take 325 mg by mouth 2 (two) times daily.   06/27/2019 at Unknown time  . gabapentin (NEURONTIN) 300 MG capsule Take 300 mg by mouth at bedtime.    06/27/2019 at Unknown time  . lamoTRIgine (LAMICTAL) 150 MG tablet Take 150 mg by mouth daily.   06/27/2019 at Unknown time  . levothyroxine (SYNTHROID) 88 MCG tablet TAKE 1 TABLET BY MOUTH DAILY. (Patient taking differently: Take 88 mcg by mouth daily before breakfast. ) 15 tablet 0 06/27/2019 at Unknown time  . loperamide (IMODIUM) 2 MG capsule Take 2 mg by mouth 4 (four) times daily as needed for diarrhea or loose stools.    Past Week at Unknown time  . LORazepam (ATIVAN) 0.5 MG tablet Take 0.5 mg by mouth 2 (two) times daily as needed for anxiety.    Past Month at Unknown time  . montelukast (SINGULAIR) 10 MG tablet TAKE 1 TABLET BY MOUTH DAILY. (Patient taking differently: Take 10 mg by mouth daily. ) 30 tablet 0 06/27/2019 at Unknown time  . ondansetron (ZOFRAN) 8 MG tablet Take 8 mg by mouth daily.   06/27/2019 at Unknown time  . Prenatal Vit-Fe Fumarate-FA (MULTIVITAMIN-PRENATAL) 27-0.8 MG TABS tablet Take 1 tablet by mouth 2 (two) times daily.   06/27/2019 at Unknown time  . promethazine (PHENERGAN) 25 MG tablet Take 25-50 mg by mouth every 6 (six) hours as needed for nausea or vomiting.    Past Month at Unknown time  . SYMBICORT 160-4.5 MCG/ACT inhaler INHALE 2 PUFFS BY MOUTH TWICE A DAY TO PREVENT COUGH OR WHEEZE. RINSE, GARGLE, AND SPIT AFTER USE. (Patient taking differently: Inhale 2 puffs into the lungs 2 (two) times daily. ) 10.2 g 5 06/27/2019 at Unknown time  . topiramate  (TOPAMAX) 50 MG tablet Take 50 mg by mouth 2 (two) times daily.   06/27/2019 at Unknown time  . vitamin E 400 UNIT capsule Take 400 Units by mouth 2 (two) times daily.   06/27/2019 at Unknown time  . albuterol (PROVENTIL) (2.5 MG/3ML) 0.083% nebulizer solution Use 1 vial nebulized every 4-6 hours as needed for cough, wheeze, shortness of breath or chest tightness (Patient taking differently: Take 2.5 mg by nebulization every 4 (four)  hours as needed for wheezing or shortness of breath. ) 180 mL 5 Unknown at Unknown time  . gabapentin (NEURONTIN) 100 MG capsule Take 100-200 mg by mouth 3 (three) times daily as needed (pain.).    More than a month at Unknown time  . olopatadine (PATANOL) 0.1 % ophthalmic solution Use one drop in each eye twice daily if needed (Patient taking differently: Place 1 drop into both eyes 2 (two) times daily as needed for allergies. ) 5 mL 5 More than a month at Unknown time      Eudelia Bunch, Sherian Rein.D [phone] 06/28/2019 1:01 PM

## 2019-06-28 NOTE — Op Note (Signed)
SHAREEKA YIM [id number] 18-May-1991 06/28/2019  Preoperative diagnosis: portal  Hypertension and morbid obesity  Postoperative diagnosis: Same   Procedure: Upper endoscopy   Surgeon: Catalina Antigua B. Hassell Done  M.D., FACS   Anesthesia: Gen.   Indications for procedure: This patient was undergoing a sleeve gastrectomy.    Description of procedure: The endoscopy was placed in the mouth and into the oropharynx and under endoscopic vision it was advanced to the esophagogastric junction. 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    The pouch was insufflated and the staple line examined.  The sleeve was cylindrical and was assessed down to the antrum.  The pylorus was identified.   No bleeding or leaks were detected.  The scope was withdrawn without difficulty.     Matt B. Hassell Done, MD, FACS General, Bariatric, & Minimally Invasive Surgery [facility], [place]

## 2019-06-28 NOTE — Interval H&P Note (Signed)
History and Physical Interval Note:  06/28/2019 9:37 AM  Linda Becker  has presented today for surgery, with the diagnosis of Morbid Obesity, NASH, GERD, Hypothryoidism, Portal HTN.  The various methods of treatment have been discussed with the patient and family. After consideration of risks, benefits and other options for treatment, the patient has consented to  Procedure(s): LAPAROSCOPIC GASTRIC SLEEVE RESECTION, Upper Endo, ERAS Pathway (N/A) as a surgical intervention.  The patient's history has been reviewed, patient examined, no change in status, stable for surgery.  I have reviewed the patient's chart and labs.  Questions were answered to the patient's satisfaction.     Linda Becker

## 2019-06-28 NOTE — Anesthesia Postprocedure Evaluation (Signed)
Anesthesia Post Note  Patient: [name]  Procedure(s) Performed: LAPAROSCOPIC GASTRIC SLEEVE RESECTION, Upper Endo, ERAS Pathway (N/A Abdomen)     Patient location during evaluation: PACU Anesthesia Type: General Level of consciousness: awake and alert and oriented Pain management: pain level controlled Vital Signs Assessment: post-procedure vital signs reviewed and stable Respiratory status: spontaneous breathing, nonlabored ventilation and respiratory function stable Cardiovascular status: blood pressure returned to baseline Postop Assessment: no apparent nausea or vomiting Anesthetic complications: no    Last Vitals:  Vitals:   06/28/19 1430 06/28/19 1452  BP: (!) 155/85 133/77  Pulse: 98 70  Resp: 18 18  Temp: 36.6 C 36.6 C  SpO2: 99% 99%    Last Pain:  Vitals:   06/28/19 1452  TempSrc: Oral  PainSc: [place]

## 2019-06-28 NOTE — Transfer of Care (Signed)
Immediate Anesthesia Transfer of Care Note  Patient: [name]  Procedure(s) Performed: LAPAROSCOPIC GASTRIC SLEEVE RESECTION, Upper Endo, ERAS Pathway (N/A Abdomen)  Patient Location: PACU  Anesthesia Type:General  Level of Consciousness: awake, alert  and patient cooperative  Airway & Oxygen Therapy: Patient Spontanous Breathing and Patient connected to face mask oxygen  Post-op Assessment: Report given to RN and Post -op Vital signs reviewed and stable  Post vital signs: Reviewed and stable  Last Vitals:  Vitals Value Taken Time  BP    Temp    Pulse    Resp    SpO2      Last Pain:  Vitals:   06/28/19 0805  TempSrc: Oral  PainSc: 0-No pain         Complications: No apparent anesthesia complications

## 2019-06-28 NOTE — Anesthesia Procedure Notes (Signed)
Procedure Name: Intubation Performed by: West Pugh, CRNA Pre-anesthesia Checklist: Patient identified, Emergency Drugs available, Suction available, Patient being monitored and Timeout performed Patient Re-evaluated:Patient Re-evaluated prior to induction Oxygen Delivery Method: Circle system utilized Preoxygenation: Pre-oxygenation with 100% oxygen Induction Type: IV induction Ventilation: Mask ventilation without difficulty Laryngoscope Size: Mac and 4 Grade View: Grade I Tube type: Oral Tube size: 7.0 mm Number of attempts: 1 Airway Equipment and Method: Stylet Placement Confirmation: ETT inserted through vocal cords under direct vision,  positive ETCO2,  CO2 detector and breath sounds checked- equal and bilateral Secured at: 21 cm Tube secured with: Tape Dental Injury: Teeth and Oropharynx as per pre-operative assessment

## 2019-06-28 NOTE — Op Note (Signed)
Operative Note  Linda Becker  [id number]  [id number]  06/28/2019   Surgeon: Clovis Riley MD   Assistant: Kaylyn Lim MD   Procedure performed: laparoscopic sleeve gastrectomy, upper endoscopy   Preop diagnosis: Morbid obesity Body mass index is 50.26 kg/m., portal hypertension Post-op diagnosis/intraop findings: same   Specimens: fundus Retained items: none  EBL: minimal cc Complications: none   Description of procedure: After obtaining informed consent and administration of chemical DVT prophylaxis in holding, the patient was taken to the operating room and placed supine on operating room table where general endotracheal anesthesia was initiated, preoperative antibiotics were administered, SCDs applied, and a formal timeout was performed. The abdomen was prepped and draped in usual sterile fashion. Peritoneal access was gained using a supraumbilical veress and insufflation to 15 mmHg ensued without issue. 45m trocar and camera were then inserted. Gross inspection revealed no evidence of injury. The liver is enlarged and nodular consisent with history of NASH/cirrhosis. The spleen is quite enlarged. The short gastric and gastroepiploic vessels were of normal caliber. There is a tiny amount of murky ascites in the lesser sac. Under direct visualization three more 5 mm trochars were placed in the right and left hemiabdomen and the 117mtrocar in the right paramedian upper abdomen. Bilateral laparoscopic assisted TAPS blocks were performed with Exparel diluted with 0.25 percent Marcaine. The patient was placed in steep Trendelenburg and the liver retractor was introduced through an incision in the upper midline and secured to the post externally to maintain the left lobe retracted anteriorly.  There was no hiatal hernia on direct inspection. Using the Harmonic scalpel, the greater curvature of the stomach was dissected away from the greater omentum and short gastric vessels were divided.  This began 6 cm from the pylorus, and dissection proceeded until the left crus was clearly exposed. The [id number] was then introduced and directed down towards the pylorus. This was placed to suction against the lesser curve. Serial fires of the linear cutting stapler with seamguards were then employed to create our sleeve. The first fire used a green load and ensured adequate room at the angularis incisura. One gold load and then several blue loads were then employed to create a narrow tubular stomach up to the angle of His. The excised stomach was then removed through our 15 mm trocar site within an Endo Catch bag. The visigi was taken off of suction and a few puffs of air were introduced, inflating the sleeve. No bubbles were observed in the irrigation fluid around the stomach and the shape was noted to be evenly tubular without any narrowing at the angularis. The visigi was then removed. Upper endoscopy was performed by the assistant surgeon and the sleeve was noted to be airtight, the staple line, gastric mucosa and esophagus were all hemostatic. Please see his separate note. The endoscope was removed. The abdomen was then carefully inspected for hemostasis, which was excellent. A tiny oozing area on the distal staple line was addressed preemptively with clips. The 15 mm trocar site fascia in the right upper abdomen was closed with 2 interrupted sutures of 0 Vicryl using the laparoscopic suture passer under direct visualization. The liver retractor was removed under direct visualization. The abdomen was then desufflated and all remaining trochars removed. The skin incisions were closed with subcuticular Monocryl; benzoin, Steri-Strips and Band-Aids were applied The patient was then awakened, extubated and taken to PACU in stable condition.     All counts were correct at  the completion of the case.

## 2019-06-29 LAB — COMPREHENSIVE METABOLIC PANEL
ALT: 58 U/L — ABNORMAL HIGH (ref 0–44)
AST: 41 U/L (ref 15–41)
Albumin: 3.5 g/dL (ref 3.5–5.0)
Alkaline Phosphatase: 104 U/L (ref 38–126)
Anion gap: 8 (ref 5–15)
BUN: 12 mg/dL (ref 6–20)
CO2: 19 mmol/L — ABNORMAL LOW (ref 22–32)
Calcium: 8.4 mg/dL — ABNORMAL LOW (ref 8.9–10.3)
Chloride: 111 mmol/L (ref 98–111)
Creatinine, Ser: 0.83 mg/dL (ref 0.44–1.00)
GFR calc Af Amer: >60 mL/min (ref >60)
GFR calc non Af Amer: >60 mL/min (ref >60)
Glucose, Bld: 105 mg/dL — ABNORMAL HIGH (ref 70–99)
Potassium: 4.2 mmol/L (ref 3.5–5.1)
Sodium: 138 mmol/L (ref 135–145)
Total Bilirubin: 0.8 mg/dL (ref 0.3–1.2)
Total Protein: 6.2 g/dL — ABNORMAL LOW (ref 6.5–8.1)

## 2019-06-29 LAB — CBC WITH DIFFERENTIAL/PLATELET
Abs Immature Granulocytes: 0.03 10*3/uL (ref 0.00–0.07)
Basophils Absolute: 0 10*3/uL (ref 0.0–0.1)
Basophils Relative: 0 %
Eosinophils Absolute: 0 10*3/uL (ref 0.0–0.5)
Eosinophils Relative: 0 %
HCT: 43 % (ref 36.0–46.0)
Hemoglobin: 13.5 g/dL (ref 12.0–15.0)
Immature Granulocytes: 0 %
Lymphocytes Relative: 19 %
Lymphs Abs: 2.1 10*3/uL (ref 0.7–4.0)
MCH: 30.4 pg (ref 26.0–34.0)
MCHC: 31.4 g/dL (ref 30.0–36.0)
MCV: 96.8 fL (ref 80.0–100.0)
Monocytes Absolute: 0.6 10*3/uL (ref 0.1–1.0)
Monocytes Relative: 6 %
Neutro Abs: 8.2 10*3/uL — ABNORMAL HIGH (ref 1.7–7.7)
Neutrophils Relative %: 75 %
Platelets: 78 10*3/uL — ABNORMAL LOW (ref 150–400)
RBC: 4.44 MIL/uL (ref 3.87–5.11)
RDW: 14.3 % (ref 11.5–15.5)
WBC: 10.9 10*3/uL — ABNORMAL HIGH (ref 4.0–10.5)
nRBC: 0 % (ref 0.0–0.2)

## 2019-06-29 LAB — MAGNESIUM: Magnesium: 2.1 mg/dL (ref 1.7–2.4)

## 2019-06-29 MED ORDER — PANTOPRAZOLE SODIUM 40 MG PO TBEC: 40.0000 mg | DELAYED_RELEASE_TABLET | Freq: Every day | ORAL | 0 refills | Status: DC

## 2019-06-29 MED ORDER — OXYCODONE HCL 5 MG PO TABS: 5.0000 mg | ORAL_TABLET | Freq: Four times a day (QID) | ORAL | 0 refills | Status: DC | PRN

## 2019-06-29 MED ORDER — ONDANSETRON 4 MG PO TBDP: 4.0000 mg | ORAL_TABLET | Freq: Four times a day (QID) | ORAL | 0 refills | Status: DC | PRN

## 2019-06-29 MED ORDER — GABAPENTIN 300 MG PO CAPS
300.0000 mg | ORAL_CAPSULE | Freq: Three times a day (TID) | ORAL | Status: DC
  Administered 2019-06-29: 300 mg via ORAL
  Filled 2019-06-29: qty 1

## 2019-06-29 MED ORDER — TRAMADOL HCL 50 MG PO TABS
50.0000 mg | ORAL_TABLET | Freq: Four times a day (QID) | ORAL | Status: DC | PRN
  Administered 2019-06-29: 50 mg via ORAL
  Filled 2019-06-29: qty 1

## 2019-06-29 MED ORDER — METHOCARBAMOL 1000 MG/10ML IJ SOLN
500.0000 mg | Freq: Once | INTRAVENOUS | Status: AC
  Administered 2019-06-29: 500 mg via INTRAVENOUS
  Filled 2019-06-29: qty 500

## 2019-06-29 MED ORDER — DOCUSATE SODIUM 100 MG PO CAPS: 100.0000 mg | ORAL_CAPSULE | Freq: Two times a day (BID) | ORAL | 0 refills | Status: AC | PRN

## 2019-06-29 MED ORDER — ACETAMINOPHEN 500 MG PO TABS: 1000.0000 mg | ORAL_TABLET | Freq: Three times a day (TID) | ORAL | 0 refills | Status: AC

## 2019-06-29 MED ORDER — GABAPENTIN 100 MG PO CAPS: 300.0000 mg | ORAL_CAPSULE | Freq: Three times a day (TID) | ORAL | 0 refills | Status: DC

## 2019-06-29 MED FILL — oxyCODONE HCL 5 MG TABS: 5 | 3 days supply | Qty: 10 | Fill #0

## 2019-06-29 MED FILL — GABAPENTIN 100 MG CAPSULE: 100 | 2 days supply | Qty: 20 | Fill #0

## 2019-06-29 MED FILL — PANTOPRAZOLE SOD DR 40 MG T: 40 | 90 days supply | Qty: 90 | Fill #0

## 2019-06-29 MED FILL — ONDANSETRON ODT 4 MG TABLET: 4 | 5 days supply | Qty: 20 | Fill #0

## 2019-06-29 NOTE — Progress Notes (Signed)
Patient alert and oriented, pain is controlled. Patient is tolerating fluids, advanced to protein shake today, patient is tolerating well.  Reviewed Gastric sleeve discharge instructions with patient and patient is able to articulate understanding.  Provided information on BELT program, Support Group and [facility] outpatient pharmacy. All questions answered, will continue to monitor.  Total fluid intake 750 Per dehdyration protocol call back one week post op

## 2019-06-29 NOTE — Progress Notes (Signed)
MD paged requesting increase in oxycodone to reduce use of IV pain med. No new orders.

## 2019-06-29 NOTE — Progress Notes (Signed)
Patient alert and oriented, Post op day 1.  Provided support and encouragement.  Encouraged pulmonary toilet, ambulation and small sips of liquids. Completed bari clear fluid goals and 240 ml of protein.  Pain level decreased to 7.  IV Robaxin infusing.   All questions answered.  Will continue to monitor.

## 2019-06-29 NOTE — Progress Notes (Signed)
Nutrition Note  RD consulted for diet education for patient s/p bariatric surgery. Bariatric nurse coordinator providing education at this time.  If nutrition issues arise, please consult RD.   Clayton Bibles, MS, RD, LDN Inpatient Clinical Dietitian Pager: [phone] After Hours Pager: [phone]

## 2019-06-29 NOTE — Progress Notes (Signed)
S: Doing well overall, but pain control is an issue.  Describes muscle soreness on bilateral lateral abdominal walls, and in the left upper quadrant underneath her breast describes sharp deep pain that radiates to her back.  Pain is not worsened by p.o. intake, but moving around does aggravate it.  She is tolerating liquids and protein shakes, not having significant nausea.  Walking in the halls.  meds- scheduled reglan, tylenol and gabapentin (has received no post-op gabapentin as of yet; reason listed was patient is npo??), has received dilaudid x 3 (last at 0339), robaxin x 1, zofran x 2, oxycodone x 1 (9pm).   Vitals, labs, intake/output, and orders reviewed at this time. Afebrile, HR 70-80s, mildly hypertensive. Sats 97-99% room air. PO 540, UOP 400- no urine output is recorded for the night shift, the patient reports that she has kept track and voided about 700 overnight. CMP unremarkable, WBC 10.9 (7.0 preop), hgb 13.5 (14.2 preop), plts 78 (75 preop).   Gen: A&Ox3, no distress H&N: EOMI, atraumatic, neck supple Chest: unlabored respirations, RRR Abd: soft, minimally appropriately tender, nondistended, incisions c/d/i with steris/ bandaids, no cellulitis or hematoma though there was some bleeding from the left lateral incision and a bulky gauze dressing is in place which is dry Ext: warm, no edema Neuro: grossly normal  Lines/tubes/drains: PIV  A/P:  POD 1 sleeve gastrectomy Continue liquids/ protein shakes Ambulate, pulm toilet, continue lovenox/ SCDs Pain control- increase gabapentin, try a scheduled Robaxin dose now, consider a one-time dose of Toradol if pain continues Discharge planning-possibly home this afternoon if pain control is improved.  I suspect this left upper quadrant pain reflects either partial splenic infarction/splenic irritation from taking down the short gastrics, or occult abdominal wall hematoma    Romana Juniper, MD [facility], [place] Pager  [phone]

## 2019-06-29 NOTE — Discharge Summary (Signed)
Physician Discharge Summary  Linda Becker ZJQ:[id number] DOB: [birth year] DOA: 06/28/2019  PCP: Burnis Medin, MD  Admit date: 06/28/2019 Discharge date: 06/29/19  Recommendations for Outpatient Follow-up:   Follow-up Information     Clovis Riley, MD. Go on 07/22/2019.   Specialty: General Surgery Why: at 150 pm Contact information: [address] [place] [address] [phone]         Surgery, [place]. Go on 08/18/2019.   Specialty: General Surgery Why: at [address] information: [place] [address] [phone]           Discharge Diagnoses:  Active Problems:   Morbid obesity ([place])   Surgical Procedure: Laparoscopic Sleeve Gastrectomy, upper endoscopy  Discharge Condition: Good Disposition: Home  Diet recommendation: Postoperative sleeve gastrectomy diet (liquids only)  Filed Weights   06/28/19 0805  Weight: (!) 137 kg     Hospital Course:  The patient was admitted for a planned laparoscopic sleeve gastrectomy. Please see operative note. Preoperative chemical dvt prophylaxis was held due to thrombocytopenia and portal hypertension. Postoperative prophylactic Lovenox dosing was started on the evening of postoperative day 0. ERAS protocol was used. On the evening of postoperative day 0, the patient was started on water and ice chips. On postoperative day 1 the patient had no fever or tachycardia and was tolerating water in their diet was gradually advanced throughout the day. The patient was ambulating without difficulty. Their vital signs are stable without fever or tachycardia. Their hemoglobin had remained stable. The patient had received discharge instructions and counseling. They were deemed stable for discharge and had met discharge criteria   Discharge Instructions  Discharge Instructions     Ambulate hourly while awake   Complete by: As directed    Call MD for:  difficulty  breathing, headache or visual disturbances   Complete by: As directed    Call MD for:  persistant dizziness or light-headedness   Complete by: As directed    Call MD for:  persistant nausea and vomiting   Complete by: As directed    Call MD for:  redness, tenderness, or signs of infection (pain, swelling, redness, odor or green/yellow discharge around incision site)   Complete by: As directed    Call MD for:  severe uncontrolled pain   Complete by: As directed    Call MD for:  temperature >101 F   Complete by: As directed    Incentive spirometry   Complete by: As directed    Perform hourly while awake      Allergies as of 06/29/2019       Reactions   Progesterone Rash   Was in a form of birth control.   Penicillins Rash   Did it involve swelling of the face/tongue/throat, SOB, or low BP? No Did it involve sudden or severe rash/hives, skin peeling, or any reaction on the inside of your mouth or nose? Yes Did you need to seek medical attention at a hospital or doctor's office? No When did it last happen?      9 + months If all above answers are "NO", may proceed with cephalosporin use.        Medication List     STOP taking these medications    esomeprazole 40 MG capsule Commonly known as: NEXIUM   loperamide 2 MG capsule Commonly known as: IMODIUM   ondansetron 8 MG tablet Commonly known as: ZOFRAN       TAKE  these medications    acetaminophen 500 MG tablet Commonly known as: TYLENOL Take 500 mg by mouth every 6 (six) hours as needed for moderate pain or headache. What changed: Another medication with the same name was added. Make sure you understand how and when to take each.   acetaminophen 500 MG tablet Commonly known as: TYLENOL Take 2 tablets (1,000 mg total) by mouth every 8 (eight) hours for 5 days. What changed: You were already taking a medication with the same name, and this prescription was added. Make sure you understand how and when to take  each.   albuterol (2.5 MG/3ML) 0.083% nebulizer solution Commonly known as: PROVENTIL Use 1 vial nebulized every 4-6 hours as needed for cough, wheeze, shortness of breath or chest tightness What changed:  how much to take how to take this when to take this reasons to take this additional instructions   albuterol 108 (90 Base) MCG/ACT inhaler Commonly known as: VENTOLIN HFA Inhale 2 puffs into the lungs every 4 (four) hours as needed for wheezing or shortness of breath. What changed: Another medication with the same name was changed. Make sure you understand how and when to take each.   ARIPiprazole 10 MG tablet Commonly known as: ABILIFY Take 10 mg by mouth at bedtime.   calcium carbonate 1500 (600 Ca) MG Tabs tablet Commonly known as: OSCAL Take 600 mg of elemental calcium by mouth 2 (two) times daily with a meal.   cetirizine 5 MG tablet Commonly known as: ZYRTEC Take 5 mg by mouth 2 (two) times daily.   dicyclomine 20 MG tablet Commonly known as: BENTYL Take 10 mg by mouth every 4-6 hours as needed. What changed:  how much to take how to take this when to take this reasons to take this additional instructions   docusate sodium 100 MG capsule Commonly known as: Colace Take 1 capsule (100 mg total) by mouth 2 (two) times daily as needed for mild constipation.   dupilumab 300 MG/2ML prefilled syringe Commonly known as: Dupixent Inject 300 mg into the skin every 14 (fourteen) days.   ferrous sulfate 325 (65 FE) MG tablet Take 325 mg by mouth 2 (two) times daily.   gabapentin 100 MG capsule Commonly known as: NEURONTIN Take 3 capsules (300 mg total) by mouth 3 (three) times daily. What changed:  medication strength when to take this Another medication with the same name was removed. Continue taking this medication, and follow the directions you see here.   lamoTRIgine 150 MG tablet Commonly known as: LAMICTAL Take 150 mg by mouth daily.   levothyroxine 88  MCG tablet Commonly known as: SYNTHROID TAKE 1 TABLET BY MOUTH DAILY. What changed: when to take this   LORazepam 0.5 MG tablet Commonly known as: ATIVAN Take 0.5 mg by mouth 2 (two) times daily as needed for anxiety.   montelukast 10 MG tablet Commonly known as: SINGULAIR TAKE 1 TABLET BY MOUTH DAILY.   multivitamin-prenatal 27-0.8 MG Tabs tablet Take 1 tablet by mouth 2 (two) times daily.   olopatadine 0.1 % ophthalmic solution Commonly known as: PATANOL Use one drop in each eye twice daily if needed What changed:  how much to take how to take this when to take this reasons to take this additional instructions   ondansetron 4 MG disintegrating tablet Commonly known as: ZOFRAN-ODT Take 1 tablet (4 mg total) by mouth every 6 (six) hours as needed for nausea or vomiting.   oxyCODONE 5 MG immediate release tablet  Commonly known as: Oxy IR/ROXICODONE Take 1 tablet (5 mg total) by mouth every 6 (six) hours as needed for severe pain.   pantoprazole 40 MG tablet Commonly known as: PROTONIX Take 1 tablet (40 mg total) by mouth daily.   promethazine 25 MG tablet Commonly known as: PHENERGAN Take 25-50 mg by mouth every 6 (six) hours as needed for nausea or vomiting.   Qnasl 80 MCG/ACT Aers Generic drug: Beclomethasone Dipropionate INSTILL 1 SPRAY IN EACH NOSTRIL ONCE DAILY AS DIRECTED What changed:  how much to take how to take this when to take this additional instructions   Symbicort 160-4.5 MCG/ACT inhaler Generic drug: budesonide-formoterol INHALE 2 PUFFS BY MOUTH TWICE A DAY TO PREVENT COUGH OR WHEEZE. RINSE, GARGLE, AND SPIT AFTER USE. What changed: See the new instructions.   topiramate 50 MG tablet Commonly known as: TOPAMAX Take 50 mg by mouth 2 (two) times daily.   vitamin C with rose hips 500 MG tablet Take 500 mg by mouth 2 (two) times daily.   Vitamin D-3 125 MCG (5000 UT) Tabs Take 5,000 Units by mouth 2 (two) times daily.   vitamin E 180 MG  (400 UNITS) capsule Take 400 Units by mouth 2 (two) times daily.       Follow-up Information     Clovis Riley, MD. Go on 07/22/2019.   Specialty: General Surgery Why: at 150 pm Contact information: [address] [place] [address] [phone]         Surgery, [place]. Go on 08/18/2019.   Specialty: General Surgery Why: at 1130 Contact information: [place] [address] [phone]             The results of significant diagnostics from this hospitalization (including imaging, microbiology, ancillary and laboratory) are listed below for reference.    Significant Diagnostic Studies: No results found.  Labs: Basic Metabolic Panel: Recent Labs  Lab 06/24/19 1221 06/29/19 0340  NA 139 138  K 4.3 4.2  CL 110 111  CO2 22 19*  GLUCOSE 80 105*  BUN 17 12  CREATININE 0.79 0.83  CALCIUM 9.1 8.4*  MG  --  2.1   Liver Function Tests: Recent Labs  Lab 06/24/19 1221 06/29/19 0340  AST 38 41  ALT 50* 58*  ALKPHOS 105 104  BILITOT 0.8 0.8  PROT 6.6 6.2*  ALBUMIN 4.0 3.5    CBC: Recent Labs  Lab 06/24/19 1221 06/28/19 1734 06/29/19 0340  WBC 7.0  --  10.9*  NEUTROABS 4.5  --  8.2*  HGB 14.2 14.2 13.5  HCT 45.1 45.4 43.0  MCV 98.5  --  96.8  PLT 75*  --  78*    CBG: No results for input(s): GLUCAP in the last 168 hours.  Active Problems:   Morbid obesity ([place])   Signed:  Clovis Riley, [facility], [place] 06/29/2019, 8:22 AM

## 2019-07-01 ENCOUNTER — Ambulatory Visit: Payer: 59 | Admitting: Professional

## 2019-07-01 MED FILL — ONDANSETRON HCL 8 MG TABLET: 8 | 30 days supply | Qty: 30 | Fill #0

## 2019-07-04 ENCOUNTER — Other Ambulatory Visit: Payer: Self-pay

## 2019-07-04 ENCOUNTER — Telehealth (HOSPITAL_COMMUNITY): Payer: Self-pay

## 2019-07-04 ENCOUNTER — Ambulatory Visit (HOSPITAL_BASED_OUTPATIENT_CLINIC_OR_DEPARTMENT_OTHER): Payer: 59 | Admitting: Pharmacist

## 2019-07-04 DIAGNOSIS — Z79899 Other long term (current) drug therapy: Secondary | ICD-10-CM

## 2019-07-04 NOTE — Progress Notes (Signed)
Virtual Visit via Telephone Note  I connected Linda N Hatcheron1/18/2021at2:30PMby telephonedue to the COVID-19 pandemic and verified that I am speaking with the correct person using two identifiers.  Consent: I discussed the limitations, risks, security and privacy concerns of performing an evaluation and management service by telephone and the availability of in person appointments.Pt was agreeable to proceed.  Location of Patient: Home  Location of Provider: Clinic  Persons participating in Telemedicine visit: Patient Myself  S: Patient presents for review of their specialty medication therapy.  Patient is currently taking Dupixent for asthma. Patient is managed by Dr. Neldon Mc for this.   Adherence: confirms. No missed doses per patient.   Efficacy: reports that she is pleased with treatment so far.   Dosing: 300 mg every 14 days  Dose adjustments: Renal: no dose adjustments (has not been studied) Hepatic: no dose adjustments (has not been studied)  Screening: TB test: patient reports this was negative  Monitoring: S/sx of infection: denies S/sx of hypersensitivity: denies S/sx of ocular effects: denies S/sx of eosinophilia/vasculitis: denies  O: Lab Results  Component Value Date   WBC 10.9 (H) 06/29/2019   HGB 13.5 06/29/2019   HCT 43.0 06/29/2019   MCV 96.8 06/29/2019   PLT 78 (L) 06/29/2019      Chemistry      Component Value Date/Time   NA 138 06/29/2019 0340   NA 138 02/06/2017 0000   K 4.2 06/29/2019 0340   CL 111 06/29/2019 0340   CO2 19 (L) 06/29/2019 0340   BUN 12 06/29/2019 0340   BUN 9 02/06/2017 0000   CREATININE 0.83 06/29/2019 0340   GLU 95 02/06/2017 0000      Component Value Date/Time   CALCIUM 8.4 (L) 06/29/2019 0340   ALKPHOS 104 06/29/2019 0340   AST 41 06/29/2019 0340   ALT 58 (H) 06/29/2019 0340   BILITOT 0.8 06/29/2019 0340       A/P: 1. Medication review: Patient currently on [place] for asthma.  Reviewed the medication with the patient, including the following: Dupixent is a monoclonal antibody used for the treatment of asthma or atopic dermatitis. Patient educated on purpose, proper use and potential adverse effects of Dupixent. Possible adverse effects include increased risk of infection, ocular effects, vasculitis/eosinophilia, and hypersensitivity reactions. No recommendations for any changes.   Benard Halsted, PharmD, [place] [phone]

## 2019-07-04 NOTE — Telephone Encounter (Signed)
Patient called to discuss post bariatric surgery follow up questions.  See below:   1.  Tell me about your pain and pain management?does not need narcotic pain medication, taking gabapentin and tylenol, cramping noted around diaphragm, discussed trying some decaffinated peppermint tea  2.  Let's talk about fluid intake.  How much total fluid are you taking in?40 ounces of fluid, encourage to work on increasing fluids today and tomorrow will reasses  3.  How much protein have you taken in the last 2 days?45 grams  4.  Have you had nausea?  Tell me about when have experienced nausea and what you did to help?using zofran in morning and evening  5.  Has the frequency or color changed with your urine?dark in color (applejuice color) not as often as past denies dizziness, normal energy  6.  Tell me what your incisions look like?no problem, right sided inside puckered  7.  Have you been passing gas? BM?had bm taking colace  8.  If a problem or question were to arise who would you call?  Do you know contact numbers for [facility], [facility], and [facility]?aware of how to contact all services  9.  How has the walking going?walking regularly, taking 1/2 mile walks  10.  How are your vitamins and calcium going?  How are you taking them?mvi and calcium without difficulty

## 2019-07-05 DIAGNOSIS — F3181 Bipolar II disorder: Secondary | ICD-10-CM | POA: Diagnosis not present

## 2019-07-05 DIAGNOSIS — F408 Other phobic anxiety disorders: Secondary | ICD-10-CM | POA: Diagnosis not present

## 2019-07-05 MED FILL — ARIPIPRAZOLE 10 MG TABS: 10 | 30 days supply | Qty: 30 | Fill #0

## 2019-07-05 MED FILL — GABAPENTIN 100 MG CAPSULE: 100 | 30 days supply | Qty: 90 | Fill #0

## 2019-07-05 MED FILL — TOPIRAMATE 50 MG TABLET: 50 | 30 days supply | Qty: 60 | Fill #0

## 2019-07-05 MED FILL — lamoTRIgine 150 MG TABS: 150 | 30 days supply | Qty: 30 | Fill #0

## 2019-07-05 MED FILL — BUPROPION HCL ER (XL) 300 M: 300 | 30 days supply | Qty: 30 | Fill #0

## 2019-07-06 LAB — SURGICAL PATHOLOGY

## 2019-07-08 ENCOUNTER — Ambulatory Visit (INDEPENDENT_AMBULATORY_CARE_PROVIDER_SITE_OTHER): Payer: 59 | Admitting: Professional

## 2019-07-08 DIAGNOSIS — F411 Generalized anxiety disorder: Secondary | ICD-10-CM | POA: Diagnosis not present

## 2019-07-12 ENCOUNTER — Other Ambulatory Visit: Payer: Self-pay

## 2019-07-12 ENCOUNTER — Encounter: Payer: 59 | Admitting: Skilled Nursing Facility1

## 2019-07-12 DIAGNOSIS — E669 Obesity, unspecified: Secondary | ICD-10-CM

## 2019-07-12 DIAGNOSIS — K7581 Nonalcoholic steatohepatitis (NASH): Secondary | ICD-10-CM | POA: Diagnosis not present

## 2019-07-12 DIAGNOSIS — I8500 Esophageal varices without bleeding: Secondary | ICD-10-CM | POA: Diagnosis not present

## 2019-07-13 ENCOUNTER — Encounter: Payer: Self-pay | Admitting: Internal Medicine

## 2019-07-13 ENCOUNTER — Other Ambulatory Visit (HOSPITAL_COMMUNITY): Payer: Self-pay | Admitting: Surgery

## 2019-07-13 ENCOUNTER — Other Ambulatory Visit: Payer: Self-pay | Admitting: Surgery

## 2019-07-13 DIAGNOSIS — R161 Splenomegaly, not elsewhere classified: Secondary | ICD-10-CM

## 2019-07-13 MED FILL — METHOCARBAMOL 750 MG TABS: 750 | 7 days supply | Qty: 21 | Fill #0

## 2019-07-13 NOTE — Progress Notes (Signed)
2 Week Post-Operative Nutrition Class   Patient was seen on 08/10/18 for Post-Operative Nutrition education at the [facility].    Surgery date: 06/28/2019 Surgery type: sleeve Start weight at [facility]: 309.5 Weight today: 285.5 broken   Body Composition Scale Date  Total Body Fat %   Visceral Fat   Fat-Free Mass %    Total Body Water %    Muscle-Mass lbs   Body Fat Displacement          Torso  lbs          Left Leg  lbs          Right Leg  lbs          Left Arm  lbs          Right Arm   lbs      The following the learning objectives were met by the patient during this course:  Identifies Phase 3 (Soft, High Proteins) Dietary Goals and will begin from 2 weeks post-operatively to 2 months post-operatively  Identifies appropriate sources of fluids and proteins   States protein recommendations and appropriate sources post-operatively  Identifies the need for appropriate texture modifications, mastication, and bite sizes when consuming solids  Identifies appropriate multivitamin and calcium sources post-operatively  Describes the need for physical activity post-operatively and will follow MD recommendations  States when to call healthcare provider regarding medication questions or post-operative complications   Handouts given during class include:  Phase 3A: Soft, High Protein Diet Handout   Follow-Up Plan: Patient will follow-up at [facility] in 6 weeks for 2 month post-op nutrition visit for diet advancement per MD.

## 2019-07-13 NOTE — Telephone Encounter (Signed)
Please let Linda Becker know that I wrote a letter/statement of health regarding the medical necessity of her gastric obesity surgery earlier this month Please mail this letter to the patient I do think she should follow-up with me, nonurgent, next available in clinic

## 2019-07-14 ENCOUNTER — Telehealth: Payer: Self-pay | Admitting: Surgery

## 2019-07-14 ENCOUNTER — Other Ambulatory Visit: Payer: Self-pay

## 2019-07-14 ENCOUNTER — Ambulatory Visit (HOSPITAL_COMMUNITY)
Admission: RE | Admit: 2019-07-14 | Discharge: 2019-07-14 | Disposition: A | Discharge status: Home / Self Care | Payer: 59 | Source: Ambulatory Visit | Attending: Surgery | Admitting: Surgery

## 2019-07-14 DIAGNOSIS — K7460 Unspecified cirrhosis of liver: Secondary | ICD-10-CM | POA: Diagnosis not present

## 2019-07-14 DIAGNOSIS — R161 Splenomegaly, not elsewhere classified: Secondary | ICD-10-CM | POA: Diagnosis not present

## 2019-07-14 MED ORDER — IOHEXOL 300 MG/ML  SOLN
100.0000 mL | Freq: Once | INTRAMUSCULAR | Status: AC | PRN
  Administered 2019-07-14: 100 mL via INTRAVENOUS

## 2019-07-14 MED ORDER — SODIUM CHLORIDE (PF) 0.9 % IJ SOLN
INTRAMUSCULAR | Status: AC
  Filled 2019-07-14: qty 50

## 2019-07-14 NOTE — Telephone Encounter (Signed)
I have reviewed the CT report and the images closely. There is no inflammation or evidence of leak surrounding the gastric sleeve. There is some air entrapped in the falciform/preperitoneal fat in the subxyphoid region, some of which may be intraperitoneal, but given no free air or fluid, contrast extravasation or inflammatory changes around the stomach I do not think this represents a leak. Her spleen has significantly enlarged compared to last CT scan (Sept 2019) and compared to my recollection of its appearance intraop. This is the likely source of her left upper quadrant and left shoulder pain and is not surprising given portal hypertension and that the short gastrics have been ligated, reducing slightly the available outflow of the spleen. I discussed all this with the patient this evening. She remains afebrile. Pain is about the same. She continues to tolerate PO fairly well. Would continue to monitor and anticipate pain will gradually resolve as equilibrium is approached.

## 2019-07-15 ENCOUNTER — Other Ambulatory Visit: Payer: Self-pay | Admitting: Nurse Practitioner

## 2019-07-15 ENCOUNTER — Ambulatory Visit: Payer: 59 | Admitting: Professional

## 2019-07-15 DIAGNOSIS — K7469 Other cirrhosis of liver: Secondary | ICD-10-CM

## 2019-07-18 ENCOUNTER — Telehealth: Payer: Self-pay | Admitting: Skilled Nursing Facility1

## 2019-07-18 MED FILL — LORazepam 0.5 MG TABS: 0.5 | 30 days supply | Qty: 15 | Fill #0

## 2019-07-18 NOTE — Telephone Encounter (Signed)
RD called pt to verify fluid intake once starting soft, solid proteins 2 week post-bariatric surgery.   Daily Fluid intake: 64+  Daily Protein intake: 60+  Concerns/issues:   None reported

## 2019-07-22 ENCOUNTER — Ambulatory Visit: Payer: Self-pay | Admitting: Surgery

## 2019-07-22 ENCOUNTER — Ambulatory Visit: Payer: 59 | Admitting: Professional

## 2019-07-22 ENCOUNTER — Other Ambulatory Visit: Payer: Self-pay | Admitting: Surgery

## 2019-07-22 DIAGNOSIS — E860 Dehydration: Secondary | ICD-10-CM | POA: Insufficient documentation

## 2019-07-22 MED ORDER — PROMETHAZINE HCL 12.5 MG PO TABS: 12.5000 mg | ORAL_TABLET | ORAL | Status: DC | PRN

## 2019-07-22 MED ORDER — THIAMINE HCL 100 MG/ML IJ SOLN: INTRAVENOUS | Status: DC

## 2019-07-22 MED ORDER — PROMETHAZINE HCL 12.5 MG RE SUPP: 12.5000 mg | RECTAL | Status: AC | PRN

## 2019-07-22 MED ORDER — SODIUM CHLORIDE 0.45 % IV SOLN: INTRAVENOUS | Status: AC

## 2019-07-22 MED FILL — PROMETHAZINE 25 MG TABLET: 25 | 7 days supply | Qty: 20 | Fill #0

## 2019-07-25 ENCOUNTER — Ambulatory Visit (HOSPITAL_COMMUNITY)
Admission: RE | Admit: 2019-07-25 | Discharge: 2019-07-25 | Disposition: A | Discharge status: Home / Self Care | Payer: 59 | Source: Ambulatory Visit | Attending: Internal Medicine | Admitting: Internal Medicine

## 2019-07-25 ENCOUNTER — Other Ambulatory Visit: Payer: Self-pay

## 2019-07-25 DIAGNOSIS — K7469 Other cirrhosis of liver: Secondary | ICD-10-CM | POA: Insufficient documentation

## 2019-07-25 MED ORDER — PROMETHAZINE HCL 25 MG PO TABS: 12.5000 mg | ORAL_TABLET | Freq: Four times a day (QID) | ORAL | Status: DC | PRN

## 2019-07-25 MED ORDER — THIAMINE HCL 100 MG/ML IJ SOLN
Freq: Once | INTRAVENOUS | Status: AC
  Filled 2019-07-25: qty 1000

## 2019-07-25 NOTE — Discharge Instructions (Signed)

## 2019-07-25 NOTE — Progress Notes (Signed)
Patient received sodium chloride 1000 ml with thiamine, folic acid, 1 mg, multivitamins adult 10 ml infusion via PIV.Tolerated well, vitals stable, discharge instructions given, verbalized understanding. Patient alert, oriented and ambulatory at the time of discharge.

## 2019-07-29 ENCOUNTER — Ambulatory Visit: Payer: 59 | Admitting: Professional

## 2019-07-29 MED FILL — PROMETHAZINE 25 MG TABLET: 25 | 7 days supply | Qty: 20 | Fill #0 | Status: TO

## 2019-07-29 MED FILL — PROMETHAZINE 25 MG TABLET: 25 | 7 days supply | Qty: 20 | Fill #0

## 2019-08-02 ENCOUNTER — Ambulatory Visit
Admission: RE | Admit: 2019-08-02 | Discharge: 2019-08-02 | Disposition: A | Discharge status: Home / Self Care | Payer: 59 | Source: Ambulatory Visit | Attending: Nurse Practitioner | Admitting: Nurse Practitioner

## 2019-08-02 DIAGNOSIS — K7469 Other cirrhosis of liver: Secondary | ICD-10-CM

## 2019-08-02 DIAGNOSIS — K7689 Other specified diseases of liver: Secondary | ICD-10-CM | POA: Diagnosis not present

## 2019-08-04 MED FILL — GABAPENTIN 100 MG CAPSULE: 100 | 30 days supply | Qty: 90 | Fill #0

## 2019-08-05 ENCOUNTER — Ambulatory Visit: Payer: 59 | Admitting: Professional

## 2019-08-05 DIAGNOSIS — F3181 Bipolar II disorder: Secondary | ICD-10-CM | POA: Diagnosis not present

## 2019-08-05 DIAGNOSIS — F408 Other phobic anxiety disorders: Secondary | ICD-10-CM | POA: Diagnosis not present

## 2019-08-12 ENCOUNTER — Ambulatory Visit (INDEPENDENT_AMBULATORY_CARE_PROVIDER_SITE_OTHER): Payer: 59 | Admitting: Professional

## 2019-08-12 DIAGNOSIS — F411 Generalized anxiety disorder: Secondary | ICD-10-CM

## 2019-08-12 DIAGNOSIS — F408 Other phobic anxiety disorders: Secondary | ICD-10-CM | POA: Diagnosis not present

## 2019-08-12 DIAGNOSIS — F3181 Bipolar II disorder: Secondary | ICD-10-CM | POA: Diagnosis not present

## 2019-08-12 MED FILL — FLUoxetine HCL 10 MG CAPS: 10 | 33 days supply | Qty: 60 | Fill #0

## 2019-08-12 MED FILL — LORazepam 0.5 MG TABS: 0.5 | 30 days supply | Qty: 60 | Fill #0

## 2019-08-14 ENCOUNTER — Telehealth: Payer: 59 | Admitting: Emergency Medicine

## 2019-08-14 DIAGNOSIS — H60501 Unspecified acute noninfective otitis externa, right ear: Secondary | ICD-10-CM

## 2019-08-14 MED ORDER — NEOMYCIN-POLYMYXIN-HC 3.5-10000-1 OT SUSP: 4.0000 [drp] | Freq: Four times a day (QID) | OTIC | 0 refills | Status: DC

## 2019-08-14 NOTE — Progress Notes (Unsigned)
E Visit for [place], I am prescribing medication for an external ear infection, but id you can get someone to look in your ear that would be good. You could have a wax impaction or eustachian tube dysfunction.   We are sorry that you are not feeling well. Here is how we plan to help!  I have prescribed: Neomycin 0.35%, polymyxin B 10,000 units/mL, and hydrocortisone 0,5% otic solution 4 drops in affected ears four times a day for 7 days   In certain cases swimmer's ear may progress to a more serious bacterial infection of the middle or inner ear.  If you have a fever 102 and up and significantly worsening symptoms, this could indicate a more serious infection moving to the middle/inner and needs face to face evaluation in an office by a provider.  Your symptoms should improve over the next 3 days and should resolve in about 7 days.  HOME CARE:   Wash your hands frequently.  Do not place the tip of the bottle on your ear or touch it with your fingers.  You can take Acetominophen 650 mg every 4-6 hours as needed for pain.  If pain is severe or moderate, you can apply a heating pad (set on low) or hot water bottle (wrapped in a towel) to outer ear for 20 minutes.  This will also increase drainage.  Avoid ear plugs  Do not use Q-tips  After showers, help the water run out by tilting your head to one side.  GET HELP RIGHT AWAY IF:   Fever is over 102.2 degrees.  You develop progressive ear pain or hearing loss.  Ear symptoms persist longer than 3 days after treatment.  MAKE SURE YOU:   Understand these instructions.  Will watch your condition.  Will get help right away if you are not doing well or get worse.  TO PREVENT SWIMMER'S EAR:  Use a bathing cap or custom fitted swim molds to keep your ears dry.  Towel off after swimming to dry your ears.  Tilt your head or pull your earlobes to allow the water to escape your ear canal.  If there is still water  in your ears, consider using a hairdryer on the lowest setting.  Thank you for choosing an e-visit. Your e-visit answers were reviewed by a board certified advanced clinical practitioner to complete your personal care plan. Depending upon the condition, your plan could have included both over the counter or prescription medications. Please review your pharmacy choice. Be sure that the pharmacy you have chosen is open so that you can pick up your prescription now.  If there is a problem you may message your provider in Spray to have the prescription routed to another pharmacy. Your safety is important to [place]. If you have drug allergies check your prescription carefully.  For the next 24 hours, you can use MyChart to ask questions about today's visit, request a non-urgent call back, or ask for a work or school excuse from your e-visit provider. You will get an email in the next two days asking about your experience. I hope that your e-visit has been valuable and will speed your recovery.      Greater than 5 but less than 10 minutes spent researching, coordinating, and implementing care for this patient today

## 2019-08-19 ENCOUNTER — Ambulatory Visit: Payer: 59 | Admitting: Professional

## 2019-08-22 ENCOUNTER — Encounter: Payer: 59 | Admitting: Skilled Nursing Facility1

## 2019-08-23 ENCOUNTER — Encounter: Payer: Self-pay | Admitting: Dietician

## 2019-08-23 ENCOUNTER — Other Ambulatory Visit: Payer: Self-pay

## 2019-08-23 ENCOUNTER — Encounter: Payer: 59 | Attending: Surgery | Admitting: Dietician

## 2019-08-23 DIAGNOSIS — F3181 Bipolar II disorder: Secondary | ICD-10-CM | POA: Diagnosis not present

## 2019-08-23 DIAGNOSIS — E669 Obesity, unspecified: Secondary | ICD-10-CM | POA: Diagnosis not present

## 2019-08-23 DIAGNOSIS — F408 Other phobic anxiety disorders: Secondary | ICD-10-CM | POA: Diagnosis not present

## 2019-08-23 MED FILL — FLUoxetine HCL 20 MG CAPS: 20 | 30 days supply | Qty: 30 | Fill #0

## 2019-08-23 NOTE — Patient Instructions (Signed)

## 2019-08-23 NOTE — Progress Notes (Signed)
Bariatric Nutrition Follow-Up Visit Medical Nutrition Therapy  Appt Start Time: 8:50am   End Time: 9:20am  2 Months Post-Operative Sleeve Gastrectomy Surgery Surgery Date: 06/28/2019  Pt's Expectations of Surgery/ Goals: to lose weight and improve health   NUTRITION ASSESSMENT  Anthropometrics  Start weight at [facility]: 309.5 lbs (date: 04/05/2019) Today's weight: 263.7 lbs  Body Composition Scale 08/23/2019  Weight  lbs 263.7  BMI 43.5  Total Body Fat  % 44.6     Visceral Fat 12  Fat-Free Mass  % 55.3     Total Body Water  % 42.1     Muscle-Mass  lbs 34.4  Body Fat Displacement ---         Torso  lbs 73         Left Leg  lbs 14.6         Right Leg  lbs 14.6         Left Arm  lbs 7.3         Right Arm  lbs 7.3    Lifestyle & Dietary Hx [employer] employee. 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 Typical meal pattern is 4-5 small meals/snacks per day plus 1 protein shake throughout the day. Tried rice once and experienced dumping syndrome. States she still feels nauseous sometimes and received rehydration, since then has felt better and intake has improved, states she will just drink more protein shakes if she is unable to tolerate foods. Primarily eats chicken and cheese, does not tolerate beef well and does not care for seafood.   24-Hr Dietary Recall First Meal: protein shake (throughout the day)  Snack: chicken   Second Meal: chicken  Snack: 2 cheese sticks   Third Meal: [place] or chicken or protein shake  Snack: popsicle  Beverages: water, [place] Spray diet juice, zero-calorie flavor packets   Estimated daily fluid intake: 64 oz Estimated daily protein intake: 60 g Supplements: bariatric MVI, calcium 3x/day  Current average weekly physical activity: walking 1-2 times/day   Post-Op Goals/ Signs/ Symptoms Using straws: no Drinking while eating: no Chewing/swallowing difficulties: no Changes in vision: no Changes to mood/headaches:  no Hair loss/changes to skin/nails: hair loss, dry skin Difficulty focusing/concentrating: no Sweating: no Dizziness/lightheadedness: no Palpitations: no  Carbonated/caffeinated beverages: no N/V/D/C/Gas: nausea (received hydration), diarrhea occasionally  Abdominal pain: no Dumping syndrome: once (after eating rice)    NUTRITION DIAGNOSIS  Overweight/obesity ([place]-3.3) related to past poor dietary habits and physical inactivity as evidenced by completed bariatric surgery and following dietary guidelines for continued weight loss and healthy nutrition status.   NUTRITION INTERVENTION Nutrition counseling (C-1) and education (E-2) to facilitate bariatric surgery goals, including: . Diet advancement to the next phase (phase 4) now including non-starchy vegetables  . The importance of consuming adequate calories as well as certain nutrients daily due to the body's need for essential vitamins, minerals, and fats . The importance of daily physical activity and to reach a goal of at least 150 minutes of moderate to vigorous physical activity weekly (or as directed by their physician) due to benefits such as increased musculature and improved lab values  Handouts Provided Include   Phase 4: Protein + Non-Starchy Vegetables  Learning Style & Readiness for Change Teaching method utilized: Visual & Auditory  Demonstrated degree of understanding via: Teach Back  Barriers to learning/adherence to lifestyle change: None Identified    MONITORING & EVALUATION Dietary intake, weekly physical activity, body weight, and goals in  4 months.  Next Steps Patient is to follow-up in 4 months for 6 month post-op follow-up.

## 2019-08-26 ENCOUNTER — Ambulatory Visit: Payer: 59 | Admitting: Professional

## 2019-09-01 MED FILL — ARIPIPRAZOLE 10 MG TABS: 10 | 30 days supply | Qty: 30 | Fill #0

## 2019-09-01 MED FILL — SUBVENITE 150 MG TABS: 150 | 30 days supply | Qty: 30 | Fill #0

## 2019-09-01 MED FILL — GABAPENTIN 100 MG CAPSULE: 100 | 24 days supply | Qty: 120 | Fill #0

## 2019-09-02 ENCOUNTER — Ambulatory Visit: Payer: 59 | Admitting: Professional

## 2019-09-08 ENCOUNTER — Ambulatory Visit: Payer: 59 | Admitting: Professional

## 2019-09-09 ENCOUNTER — Ambulatory Visit: Payer: 59 | Admitting: Professional

## 2019-09-16 ENCOUNTER — Ambulatory Visit: Payer: 59 | Admitting: Professional

## 2019-09-19 ENCOUNTER — Other Ambulatory Visit: Payer: 59

## 2019-09-21 ENCOUNTER — Ambulatory Visit: Payer: 59 | Admitting: Allergy and Immunology

## 2019-09-22 MED FILL — PROMETHAZINE 25 MG TABLET: 25 | 6 days supply | Qty: 20 | Fill #0

## 2019-09-23 ENCOUNTER — Ambulatory Visit: Payer: 59 | Admitting: Endocrinology

## 2019-09-23 NOTE — Telephone Encounter (Signed)
She  can also send in picture if on my chart

## 2019-09-24 ENCOUNTER — Ambulatory Visit (INDEPENDENT_AMBULATORY_CARE_PROVIDER_SITE_OTHER): Payer: 59 | Admitting: Professional

## 2019-09-24 DIAGNOSIS — F411 Generalized anxiety disorder: Secondary | ICD-10-CM

## 2019-09-27 ENCOUNTER — Ambulatory Visit (INDEPENDENT_AMBULATORY_CARE_PROVIDER_SITE_OTHER): Payer: 59 | Admitting: Professional

## 2019-09-27 ENCOUNTER — Other Ambulatory Visit: Payer: Self-pay | Admitting: Endocrinology

## 2019-09-27 ENCOUNTER — Other Ambulatory Visit (INDEPENDENT_AMBULATORY_CARE_PROVIDER_SITE_OTHER): Payer: 59

## 2019-09-27 DIAGNOSIS — Z131 Encounter for screening for diabetes mellitus: Secondary | ICD-10-CM | POA: Diagnosis not present

## 2019-09-27 DIAGNOSIS — F411 Generalized anxiety disorder: Secondary | ICD-10-CM | POA: Diagnosis not present

## 2019-09-27 DIAGNOSIS — E039 Hypothyroidism, unspecified: Secondary | ICD-10-CM

## 2019-09-27 LAB — TSH: TSH: 1.87 u[IU]/mL (ref 0.35–4.50)

## 2019-09-27 LAB — T4, FREE: Free T4: 0.73 ng/dL (ref 0.60–1.60)

## 2019-09-27 LAB — GLUCOSE, RANDOM: Glucose, Bld: 122 mg/dL — ABNORMAL HIGH (ref 70–99)

## 2019-09-27 LAB — T3, FREE: T3, Free: 3.4 pg/mL (ref 2.3–4.2)

## 2019-09-28 ENCOUNTER — Ambulatory Visit: Payer: 59 | Admitting: Professional

## 2019-09-29 ENCOUNTER — Other Ambulatory Visit: Payer: Self-pay

## 2019-09-29 ENCOUNTER — Encounter: Payer: Self-pay | Admitting: Allergy and Immunology

## 2019-09-29 ENCOUNTER — Ambulatory Visit: Payer: 59 | Admitting: Allergy and Immunology

## 2019-09-29 ENCOUNTER — Other Ambulatory Visit: Payer: Self-pay | Admitting: Allergy and Immunology

## 2019-09-29 VITALS — BP 108/64 | HR 100 | Temp 97.2°F | Resp 16 | Ht 65.5 in | Wt 268.0 lb

## 2019-09-29 DIAGNOSIS — IMO0001 Reserved for inherently not codable concepts without codable children: Secondary | ICD-10-CM

## 2019-09-29 DIAGNOSIS — H1010 Acute atopic conjunctivitis, unspecified eye: Secondary | ICD-10-CM | POA: Diagnosis not present

## 2019-09-29 DIAGNOSIS — K219 Gastro-esophageal reflux disease without esophagitis: Secondary | ICD-10-CM | POA: Diagnosis not present

## 2019-09-29 DIAGNOSIS — J4550 Severe persistent asthma, uncomplicated: Secondary | ICD-10-CM | POA: Diagnosis not present

## 2019-09-29 DIAGNOSIS — J3089 Other allergic rhinitis: Secondary | ICD-10-CM | POA: Diagnosis not present

## 2019-09-29 DIAGNOSIS — T63481D Toxic effect of venom of other arthropod, accidental (unintentional), subsequent encounter: Secondary | ICD-10-CM

## 2019-09-29 MED ORDER — QNASL 80 MCG/ACT NA AERS: INHALATION_SPRAY | NASAL | 1 refills | Status: DC

## 2019-09-29 MED ORDER — SYMBICORT 160-4.5 MCG/ACT IN AERO: INHALATION_SPRAY | RESPIRATORY_TRACT | 1 refills | Status: DC

## 2019-09-29 MED ORDER — MONTELUKAST SODIUM 10 MG PO TABS: 10.0000 mg | ORAL_TABLET | Freq: Every day | ORAL | 1 refills | Status: DC

## 2019-09-29 MED ORDER — OLOPATADINE HCL 0.2 % OP SOLN: OPHTHALMIC | 1 refills | Status: DC

## 2019-09-29 MED ORDER — ALBUTEROL SULFATE HFA 108 (90 BASE) MCG/ACT IN AERS: INHALATION_SPRAY | RESPIRATORY_TRACT | 1 refills | Status: DC

## 2019-09-29 MED FILL — SYMBICORT 160-4.5 MCG INH: 160-4.5 | 90 days supply | Qty: 31 | Fill #0

## 2019-09-29 MED FILL — ALBUTEROL SULFATE HFA 108 (: 108 (90 BAS | 16 days supply | Qty: 18 | Fill #0

## 2019-09-29 MED FILL — MONTELUKAST SOD 10 MG TAB: 10 | 90 days supply | Qty: 90 | Fill #0

## 2019-09-29 MED FILL — QNASL 80 MCG/ACT AERS: 80 | 60 days supply | Qty: 11 | Fill #0

## 2019-09-29 MED FILL — OLOPATADINE HCL 0.2 % SOLN: 0.2 | 75 days supply | Qty: 8 | Fill #0

## 2019-09-29 NOTE — Patient Instructions (Addendum)
    1. Every day use the following:   A. Symbicort 160 two inhalation two times per day with spacer  B. montelukast 60m one tablet one time per day  C. Protonix 40 mg one tablet twice a day  D. Cetirizine 165mone tablet two times per day  E. Qnasal - one puff each nostril one time per day  F. Dupilumab every 2 weeks    2. If needed:   A. ProAir HFA 2 inhalations or albuterol neb every 4-6 hours  B. OTC Systane multiple times per day  C. Pataday one drop each eye one time per day  D. Epi-Pen  3. Obtain Covid vaccine  4. Return in 12 weeks or earlier if problem

## 2019-09-29 NOTE — Progress Notes (Signed)
[place]   Follow-up Note  Referring Provider: Burnis Medin, MD Primary Provider: Burnis Medin, MD Date of Office Visit: 09/29/2019  Subjective:   Linda Becker (DOB: 10-26-1990) is a 29 y.o. female who returns to the [facility] [place] on 09/29/2019 in re-evaluation of the following:  HPI: Linda Becker returns to this clinic in evaluation of asthma and allergic rhinoconjunctivitis and atopic dermatitis and reflux.  I last saw her in this clinic on 01 July 2018.  Her respiratory tract issue was really doing very well while using dupilumab and she did not use any controller agents on a regular basis.  It does not sound as though she has required a systemic steroid or an antibiotic for any type of airway issue and rarely used a short acting bronchodilator.  Unfortunately, about 1 month ago she acquired an indoor dog and ever since then has been having some issues with watery eyes and some nasal congestion and been having some hives and having coughing and wheezing.  She has been using Benadryl on a regular basis.  Unfortunately, at the same time that she acquired her dog her prescription for dupilumab ran out and she did not obtain a refill.  Thus, she has been out of dupilumab for about 6 weeks or so.  Currently she is using pantoprazole for her reflux which she thinks is working quite well.  She underwent a gastric sleeve on 27 June 2019.  She tells me that she has a history of developing a reaction to a bee sting.  She was stung by some identified flying insect on her right leg and developed what sounds like respiratory distress for which she utilized an EpiPen and was transported to the emergency room.  Allergies as of 09/29/2019      Reactions   Progesterone Rash   Was in a form of birth control.   Penicillins Rash   Did it involve swelling of the face/tongue/throat, SOB, or low BP? No Did it involve sudden or  severe rash/hives, skin peeling, or any reaction on the inside of your mouth or nose? Yes Did you need to seek medical attention at a hospital or doctor's office? No When did it last happen?9 + months If all above answers are "NO", may proceed with cephalosporin use.      Medication List      albuterol (2.5 MG/3ML) 0.083% nebulizer solution Commonly known as: PROVENTIL Use 1 vial nebulized every 4-6 hours as needed for cough, wheeze, shortness of breath or chest tightness   albuterol 108 (90 Base) MCG/ACT inhaler Commonly known as: VENTOLIN HFA Can inhale two puffs every four to six hours as needed for cough or wheeze.   calcium carbonate 1500 (600 Ca) MG Tabs tablet Commonly known as: OSCAL Take 600 mg of elemental calcium by mouth 2 (two) times daily with a meal.   cetirizine 5 MG tablet Commonly known as: ZYRTEC Take 5 mg by mouth 2 (two) times daily.   dupilumab 300 MG/2ML prefilled syringe Commonly known as: Dupixent Inject 300 mg into the skin every 14 (fourteen) days.   lamoTRIgine 150 MG tablet Commonly known as: LAMICTAL Take 150 mg by mouth daily.   levothyroxine 88 MCG tablet Commonly known as: SYNTHROID TAKE 1 TABLET BY MOUTH DAILY.   montelukast 10 MG tablet Commonly known as: SINGULAIR Take 1 tablet (10 mg total) by mouth daily.   multivitamin-prenatal 27-0.8 MG Tabs tablet Take  1 tablet by mouth 2 (two) times daily.   Olopatadine HCl 0.2 % Soln Can use one drop in each eye once daily if needed.   pantoprazole 40 MG tablet Commonly known as: PROTONIX Take 1 tablet (40 mg total) by mouth daily.   Qnasl 80 MCG/ACT Aers Generic drug: Beclomethasone Dipropionate INSTILL 1 SPRAY IN EACH NOSTRIL ONCE DAILY AS DIRECTED   Symbicort 160-4.5 MCG/ACT inhaler Generic drug: budesonide-formoterol INHALE 2 PUFFS BY MOUTH TWICE A DAY TO PREVENT COUGH OR WHEEZE. RINSE, GARGLE, AND SPIT AFTER USE.   vitamin C with rose hips 500 MG tablet Take 500 mg by  mouth 2 (two) times daily.   vitamin E 180 MG (400 UNITS) capsule Take 400 Units by mouth 2 (two) times daily.       Past Medical History:  Diagnosis Date  . Allergic rhinitis    remote hx of allergy shots  . Asthma    since childhood  on controller meds extrinsic dr Carmelina Peal  . Family history of adverse reaction to anesthesia   . Gallbladder sludge   . Gastropathy   . GERD (gastroesophageal reflux disease)    on nexium  for  long term sx since childhood  . H/O miscarriage, not currently pregnant    [redacted] weeks  march 2016  . Hepatic steatosis   . Hypothyroidism   . Migraine   . Murmur    pt reports MVP  . Sessile colonic polyp   . Splenomegaly   . Steatohepatitis   . Syncope    under eval ? cause  . Tachycardia    episodes with near syncope eval dr Nadyne Coombes  on no meds dced LABA    Past Surgical History:  Procedure Laterality Date  . BIOPSY  09/03/2018   Procedure: BIOPSY;  Surgeon: Jerene Bears, MD;  Location: [facility];  Service: Gastroenterology;;  . broken right femur  2008   rod placed  . COLONOSCOPY WITH PROPOFOL N/A 09/03/2018   Procedure: COLONOSCOPY WITH PROPOFOL;  Surgeon: Jerene Bears, MD;  Location: [facility];  Service: Gastroenterology;  Laterality: N/A;  . DILATION AND CURETTAGE OF UTERUS    . ESOPHAGOGASTRODUODENOSCOPY (EGD) WITH PROPOFOL N/A 09/03/2018   Procedure: ESOPHAGOGASTRODUODENOSCOPY (EGD) WITH PROPOFOL;  Surgeon: Jerene Bears, MD;  Location: [facility];  Service: Gastroenterology;  Laterality: N/A;  . IR TRANSCATHETER BX  01/19/2019  . IR US GUIDE VASC ACCESS RIGHT  01/19/2019  . IR VENOGRAM HEPATIC W HEMODYNAMIC EVALUATION  01/19/2019  . LAPAROSCOPIC GASTRIC SLEEVE RESECTION N/A 06/28/2019   Procedure: LAPAROSCOPIC GASTRIC SLEEVE RESECTION, Upper Endo, ERAS Pathway;  Surgeon: Clovis Riley, MD;  Location: [facility];  Service: General;  Laterality: N/A;  . OB ultrasound N/A 12/01/2017   see report  . POLYPECTOMY  09/03/2018   Procedure:  POLYPECTOMY;  Surgeon: Jerene Bears, MD;  Location: [facility];  Service: Gastroenterology;;  . Jacques Earthly  . WISDOM TOOTH EXTRACTION      Review of systems negative except as noted in HPI / PMHx or noted below:  Review of Systems  Constitutional: Negative.   HENT: Negative.   Eyes: Negative.   Respiratory: Negative.   Cardiovascular: Negative.   Gastrointestinal: Negative.   Genitourinary: Negative.   Musculoskeletal: Negative.   Skin: Negative.   Neurological: Negative.   Endo/Heme/Allergies: Negative.   Psychiatric/Behavioral: Negative.      Objective:   Vitals:   09/29/19 1705  BP: 108/64  Pulse: 100  Resp: 16  Temp: Marland Kitchen)  97.2 F (36.2 C)  SpO2: 98%   Height: 5' 5.5" (166.4 cm)  Weight: 268 lb (121.6 kg)   Physical Exam Constitutional:      Appearance: She is not diaphoretic.  HENT:     Head: Normocephalic.     Right Ear: Tympanic membrane, ear canal and external ear normal.     Left Ear: Tympanic membrane, ear canal and external ear normal.     Nose: Nose normal. No mucosal edema or rhinorrhea.     Mouth/Throat:     Pharynx: Uvula midline. No oropharyngeal exudate.  Eyes:     Conjunctiva/sclera: Conjunctivae normal.  Neck:     Thyroid: No thyromegaly.     Trachea: Trachea normal. No tracheal tenderness or tracheal deviation.  Cardiovascular:     Rate and Rhythm: Normal rate and regular rhythm.     Heart sounds: Normal heart sounds, S1 normal and S2 normal. No murmur.  Pulmonary:     Effort: No respiratory distress.     Breath sounds: Normal breath sounds. No stridor. No wheezing or rales.  Lymphadenopathy:     Head:     Right side of head: No tonsillar adenopathy.     Left side of head: No tonsillar adenopathy.     Cervical: No cervical adenopathy.  Skin:    Findings: No erythema or rash.     Nails: There is no clubbing.  Neurological:     Mental Status: She is alert.     Diagnostics:    Spirometry was performed and demonstrated  an FEV1 of 3.24 at 94 % of predicted.  Assessment and Plan:   1. Not well controlled severe persistent asthma   2. Perennial allergic rhinitis   3. Other allergic rhinitis   4. Seasonal allergic conjunctivitis   5. Hymenoptera sting, accidental or unintentional, subsequent encounter   6. Gastroesophageal reflux disease, unspecified whether esophagitis present      1. Every day use the following:   A. Symbicort 160 two inhalation two times per day with spacer  B. montelukast 74m one tablet one time per day  C. Protonix 40 mg one tablet twice a day  D. Cetirizine 172mone tablet two times per day  E. Qnasal - one puff each nostril one time per day  F. Dupilumab every 2 weeks    2. If needed:   A. ProAir HFA 2 inhalations or albuterol neb every 4-6 hours  B. OTC Systane multiple times per day  C. Pataday one drop each eye one time per day  D. Epi-Pen  3. Obtain Covid vaccine  4. Return in 12 weeks or earlier if problem   BrTanzaniappears to have a flare of her atopic driven respiratory tract disease most likely secondary to dog exposure.  The question at hand is whether or not the reinstitution of dupilumab is going to take care of this issue even though she continues to have dog exposure.  I have encouraged her to restart all of her anti-inflammatory medications for her airway and restart her dupilumab and we will see what happens over the course of the next several weeks.  She will keep in contact with me noting her response to this approach.  Her history of hymenoptera reaction will need to be evaluated at some point in the future but we need to get her other issues under control first.  ErAllena KatzMD [facility]

## 2019-09-30 ENCOUNTER — Other Ambulatory Visit: Payer: Self-pay

## 2019-09-30 ENCOUNTER — Ambulatory Visit: Payer: 59 | Admitting: Professional

## 2019-10-03 ENCOUNTER — Encounter: Payer: Self-pay | Admitting: Allergy and Immunology

## 2019-10-04 ENCOUNTER — Ambulatory Visit: Payer: 59 | Admitting: Endocrinology

## 2019-10-04 DIAGNOSIS — Z0289 Encounter for other administrative examinations: Secondary | ICD-10-CM

## 2019-10-05 MED FILL — LORazepam 0.5 MG TABS: 0.5 | 30 days supply | Qty: 60 | Fill #0

## 2019-10-05 MED FILL — GABAPENTIN 100 MG CAPSULE: 100 | 24 days supply | Qty: 120 | Fill #1

## 2019-10-06 ENCOUNTER — Telehealth: Payer: Self-pay | Admitting: *Deleted

## 2019-10-06 ENCOUNTER — Other Ambulatory Visit: Payer: Self-pay | Admitting: Pharmacist

## 2019-10-06 MED ORDER — DUPIXENT 300 MG/2ML ~~LOC~~ SOSY: 300.0000 mg | PREFILLED_SYRINGE | SUBCUTANEOUS | 11 refills | Status: DC

## 2019-10-06 NOTE — Telephone Encounter (Signed)
L/M for patient advising approval and Rx to [facility] for [place]. Should have copay card on file from previous Rx

## 2019-10-07 ENCOUNTER — Ambulatory Visit (INDEPENDENT_AMBULATORY_CARE_PROVIDER_SITE_OTHER): Payer: 59 | Admitting: Professional

## 2019-10-07 DIAGNOSIS — F411 Generalized anxiety disorder: Secondary | ICD-10-CM

## 2019-10-14 ENCOUNTER — Ambulatory Visit: Payer: 59 | Admitting: Professional

## 2019-10-18 ENCOUNTER — Ambulatory Visit (INDEPENDENT_AMBULATORY_CARE_PROVIDER_SITE_OTHER): Payer: 59 | Admitting: Professional

## 2019-10-18 DIAGNOSIS — F411 Generalized anxiety disorder: Secondary | ICD-10-CM | POA: Diagnosis not present

## 2019-10-25 ENCOUNTER — Ambulatory Visit (INDEPENDENT_AMBULATORY_CARE_PROVIDER_SITE_OTHER): Payer: 59 | Admitting: Professional

## 2019-10-25 DIAGNOSIS — F411 Generalized anxiety disorder: Secondary | ICD-10-CM | POA: Diagnosis not present

## 2019-10-28 ENCOUNTER — Ambulatory Visit: Payer: 59 | Admitting: Professional

## 2019-10-31 ENCOUNTER — Ambulatory Visit: Payer: 59 | Admitting: Professional

## 2019-11-02 ENCOUNTER — Other Ambulatory Visit (HOSPITAL_COMMUNITY): Payer: Self-pay | Admitting: Surgery

## 2019-11-02 MED FILL — PANTOPRAZOLE SOD DR 40 MG T: 40 | 90 days supply | Qty: 90 | Fill #0

## 2019-11-02 MED FILL — PROMETHAZINE 12.5 MG TABLET: 12.5 | 5 days supply | Qty: 20 | Fill #0

## 2019-11-04 ENCOUNTER — Ambulatory Visit (INDEPENDENT_AMBULATORY_CARE_PROVIDER_SITE_OTHER): Payer: 59 | Admitting: Professional

## 2019-11-04 DIAGNOSIS — F411 Generalized anxiety disorder: Secondary | ICD-10-CM

## 2019-11-08 MED FILL — SUBVENITE 150 MG TABS: 150 | 30 days supply | Qty: 30 | Fill #1

## 2019-11-08 MED FILL — ARIPIPRAZOLE 10 MG TABS: 10 | 30 days supply | Qty: 30 | Fill #1

## 2019-11-08 MED FILL — GABAPENTIN 100 MG CAPSULE: 100 | 24 days supply | Qty: 120 | Fill #0

## 2019-11-16 IMAGING — US US BIOPSY CORE LIVER
1 series · 10 of 10 positions shown · non-contrast
Comparison: none

INDICATION: 27-year-old female with a history of transaminitis

[Series 1: us biopsy core liver · 0.23mm/px · 10 of 10 slices shown]
[im 1/10]
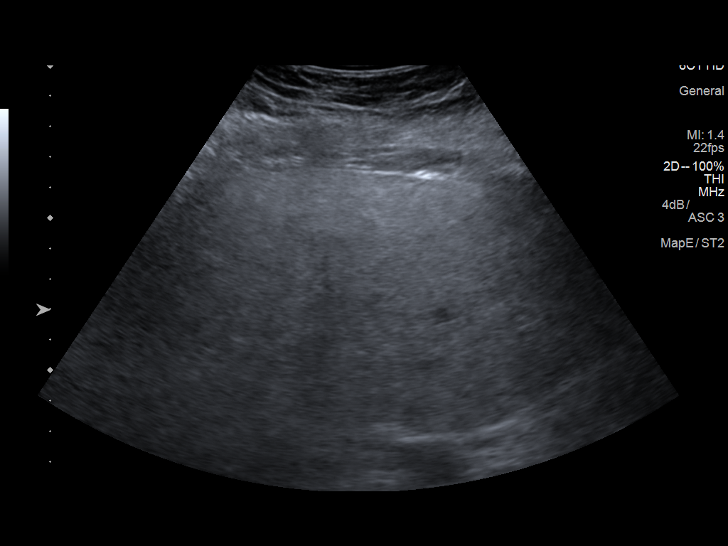
[im 2/10]
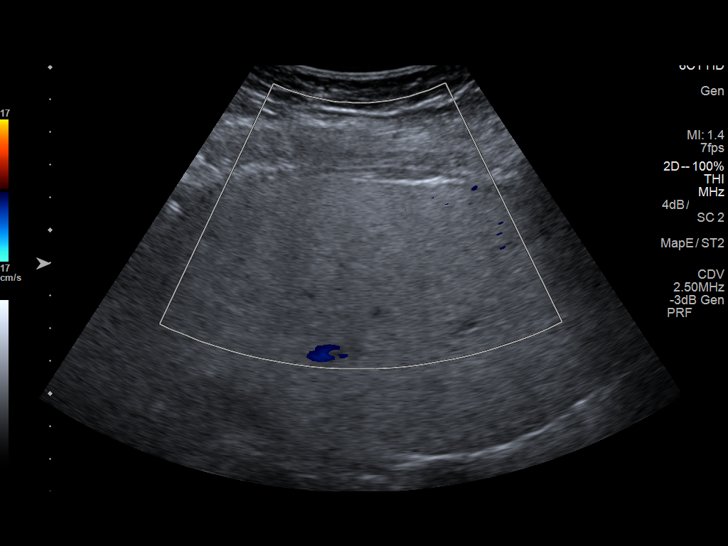
[im 3/10]
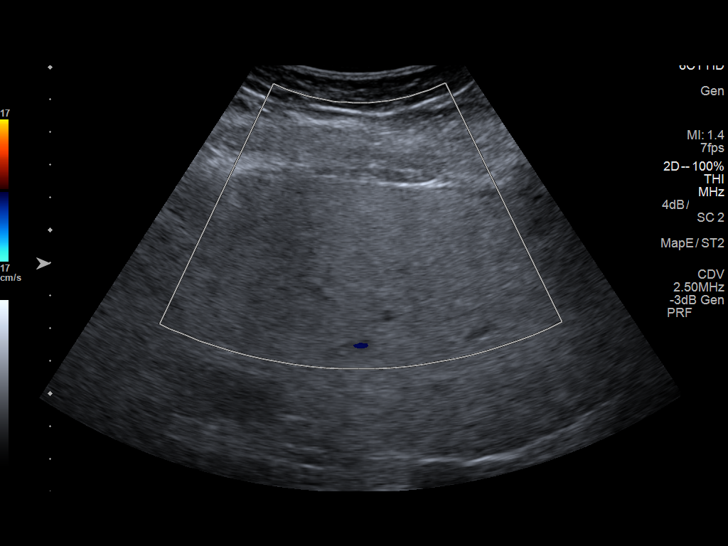
[im 4/10]
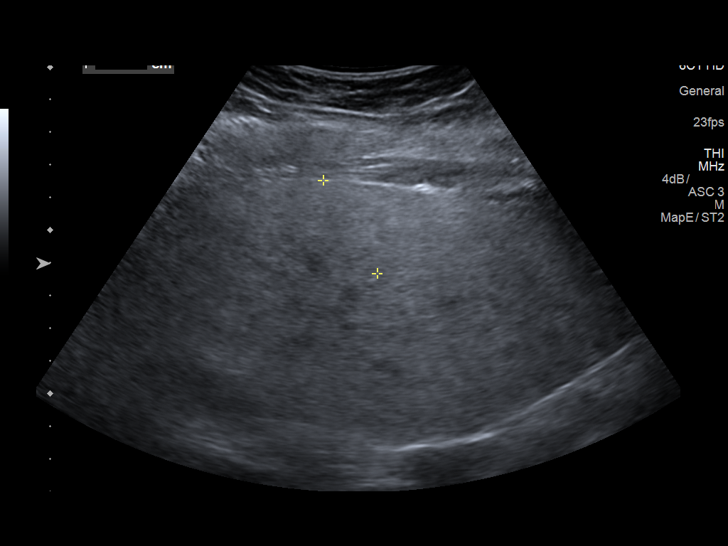
[im 5/10]
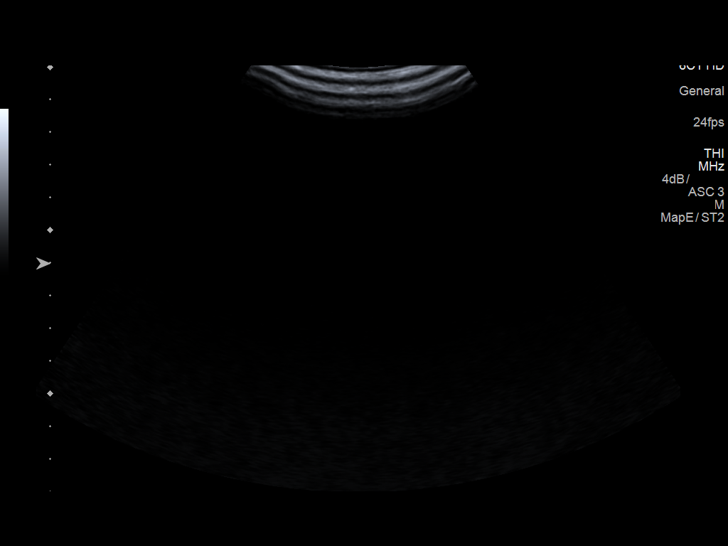
[im 6/10]
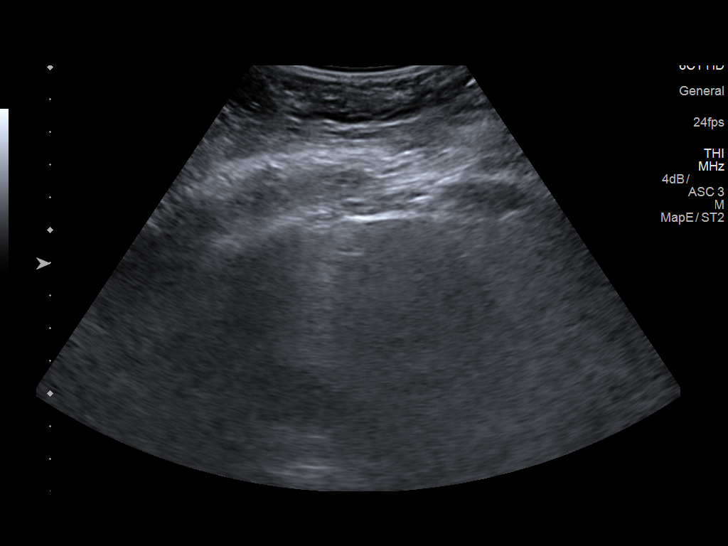
[im 7/10]
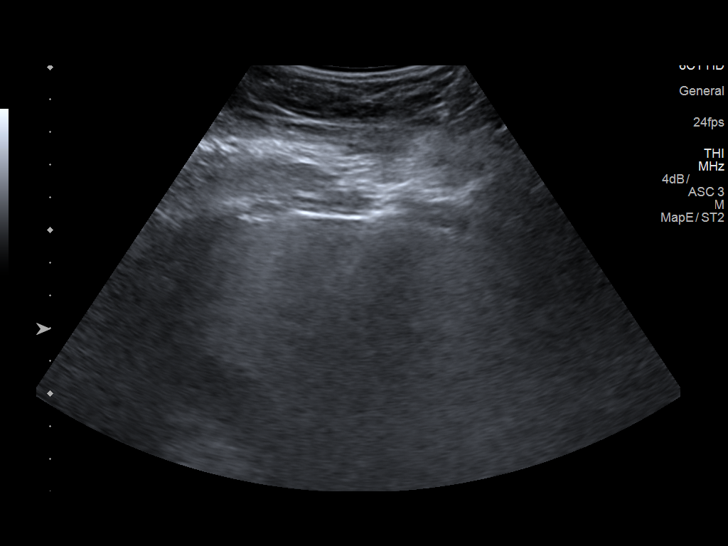
[im 8/10]
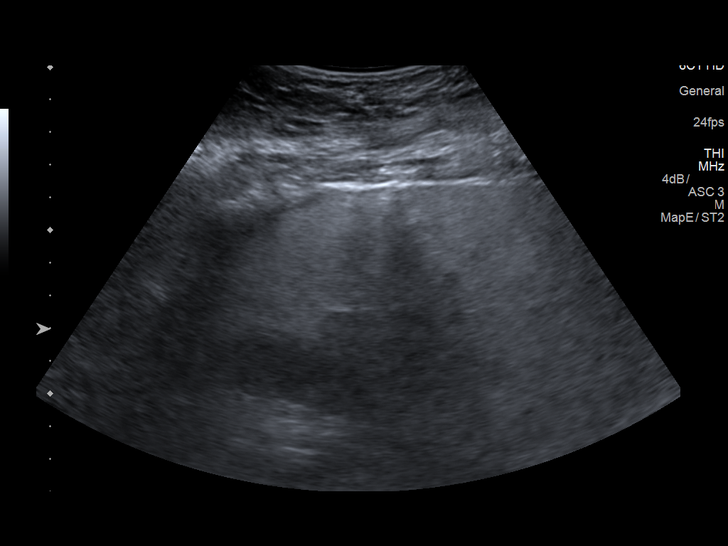
[im 9/10]
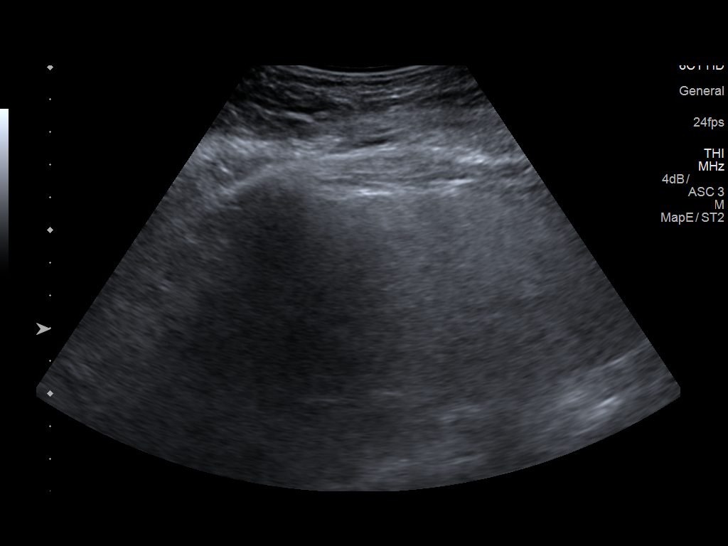
[im 10/10]
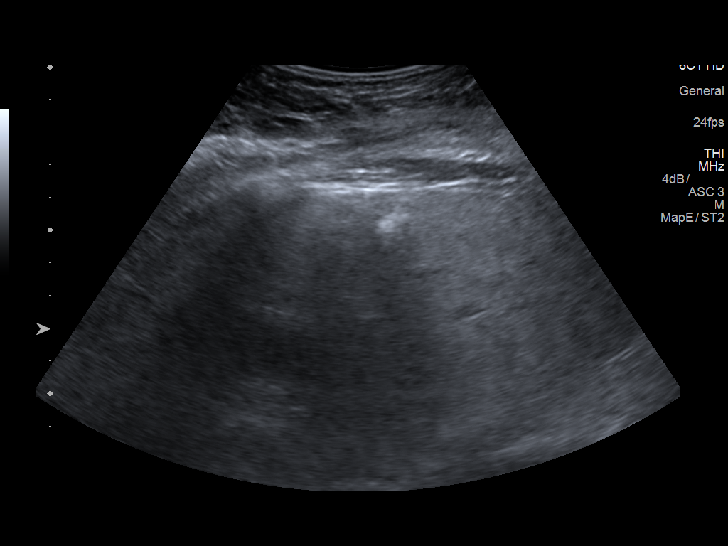

[10 of 10 positions shown; findings below may reference images not displayed]

EXAM:
ULTRASOUND-GUIDED LIVER BIOPSY

MEDICATIONS:
None.

ANESTHESIA/SEDATION:
Moderate (conscious) sedation was employed during this procedure. A
total of Versed 1.5 mg and Fentanyl 100 mcg was administered
intravenously.

4 mg IV Zofran

Moderate Sedation Time: 14 minutes. The patient's level of
consciousness and vital signs were monitored continuously by
radiology nursing throughout the procedure under my direct
supervision.

FLUOROSCOPY TIME:  None

COMPLICATIONS:
None

PROCEDURE:
The procedure, risks, benefits, and alternatives were explained to
the patient. Questions regarding the procedure were encouraged and
answered. The patient understands and consents to the procedure.

Ultrasound survey of the right liver lobe performed with images
stored and sent to PACs.

The right lower thorax/right upper abdomen was prepped with Betadine
in a sterile fashion, and a sterile drape was applied covering the
operative field. A sterile gown and sterile gloves were used for the
procedure. Local anesthesia was provided with 1% Lidocaine.

Once the patient is prepped and draped sterilely and the skin and
subcutaneous tissues were generously infiltrated with 1% lidocaine,
a small stab incision was made with an 11 blade scalpel.

A 17 gauge introducer needle was advanced under ultrasound guidance
in an intercostal location into the right liver lobe. The stylet was
removed, and 3 separate 18 gauge core biopsy were retrieved. Samples
were placed into formalin for transportation to the lab.

Three separate Gel-Foam pledgets were then infused with a small
amount of saline for assistance with hemostasis.

The needle was removed, and a final ultrasound image was performed.

The patient tolerated the procedure well and remained
hemodynamically stable throughout.

No complications were encountered and no significant blood loss was
encounter.
IMPRESSION: Status post ultrasound-guided medical liver biopsy. Tissue specimen
sent to pathology for complete histopathologic analysis.

## 2019-11-18 ENCOUNTER — Ambulatory Visit (INDEPENDENT_AMBULATORY_CARE_PROVIDER_SITE_OTHER): Payer: 59 | Admitting: Professional

## 2019-11-18 DIAGNOSIS — F411 Generalized anxiety disorder: Secondary | ICD-10-CM | POA: Diagnosis not present

## 2019-11-25 ENCOUNTER — Ambulatory Visit (INDEPENDENT_AMBULATORY_CARE_PROVIDER_SITE_OTHER): Payer: 59 | Admitting: Professional

## 2019-11-25 DIAGNOSIS — F411 Generalized anxiety disorder: Secondary | ICD-10-CM

## 2019-11-29 MED FILL — LORazepam 0.5 MG TABS: 0.5 | 30 days supply | Qty: 45 | Fill #0

## 2019-12-02 ENCOUNTER — Ambulatory Visit (INDEPENDENT_AMBULATORY_CARE_PROVIDER_SITE_OTHER): Payer: 59 | Admitting: Professional

## 2019-12-02 DIAGNOSIS — F411 Generalized anxiety disorder: Secondary | ICD-10-CM

## 2019-12-07 MED FILL — GABAPENTIN 100 MG CAPSULE: 100 | 30 days supply | Qty: 150 | Fill #0

## 2019-12-12 MED FILL — PROMETHAZINE 12.5 MG TABLET: 12.5 | 5 days supply | Qty: 20 | Fill #0

## 2019-12-16 ENCOUNTER — Ambulatory Visit (INDEPENDENT_AMBULATORY_CARE_PROVIDER_SITE_OTHER): Payer: 59 | Admitting: Professional

## 2019-12-16 DIAGNOSIS — F411 Generalized anxiety disorder: Secondary | ICD-10-CM

## 2019-12-20 DIAGNOSIS — F3181 Bipolar II disorder: Secondary | ICD-10-CM | POA: Diagnosis not present

## 2019-12-20 DIAGNOSIS — F408 Other phobic anxiety disorders: Secondary | ICD-10-CM | POA: Diagnosis not present

## 2019-12-20 MED FILL — ARIPIPRAZOLE 10 MG TABS: 10 | 90 days supply | Qty: 90 | Fill #0

## 2019-12-20 MED FILL — FLUoxetine HCL 40 MG CAPS: 40 | 90 days supply | Qty: 90 | Fill #0

## 2019-12-20 MED FILL — TOPIRAMATE 50 MG TABLET: 50 | 90 days supply | Qty: 180 | Fill #0

## 2019-12-23 ENCOUNTER — Ambulatory Visit (INDEPENDENT_AMBULATORY_CARE_PROVIDER_SITE_OTHER): Payer: 59 | Admitting: Professional

## 2019-12-23 DIAGNOSIS — F411 Generalized anxiety disorder: Secondary | ICD-10-CM

## 2019-12-27 ENCOUNTER — Encounter: Payer: 59 | Attending: Surgery | Admitting: Skilled Nursing Facility1

## 2019-12-30 ENCOUNTER — Ambulatory Visit (INDEPENDENT_AMBULATORY_CARE_PROVIDER_SITE_OTHER): Payer: 59 | Admitting: Professional

## 2019-12-30 DIAGNOSIS — F411 Generalized anxiety disorder: Secondary | ICD-10-CM

## 2020-01-02 ENCOUNTER — Telehealth: Payer: 59 | Admitting: Physician Assistant

## 2020-01-02 DIAGNOSIS — R300 Dysuria: Secondary | ICD-10-CM

## 2020-01-02 MED ORDER — FLUCONAZOLE 150 MG PO TABS: 150.0000 mg | ORAL_TABLET | Freq: Once | ORAL | 0 refills | Status: AC

## 2020-01-02 MED ORDER — CEPHALEXIN 500 MG PO CAPS: 500.0000 mg | ORAL_CAPSULE | Freq: Two times a day (BID) | ORAL | 0 refills | Status: AC

## 2020-01-02 MED FILL — FLUCONAZOLE 150 MG TABS: 150 | 1 days supply | Qty: 1 | Fill #0

## 2020-01-02 MED FILL — CEPHALEXIN 500 MG CAPSULE: 500 | 7 days supply | Qty: 14 | Fill #0

## 2020-01-02 NOTE — Progress Notes (Signed)
We are sorry that you are not feeling well.  Here is how we plan to help!  Based on what you shared with me it looks like you most likely have a simple urinary tract infection.  A UTI (Urinary Tract Infection) is a bacterial infection of the bladder.  Most cases of urinary tract infections are simple to treat but a key part of your care is to encourage you to drink plenty of fluids and watch your symptoms carefully.  I have prescribed Keflex 500 mg twice a day for 7 days. I have also prescribed 150 mg of Fluconazole to help treat a possible yeast infection.  Your symptoms should gradually improve. Call us if the burning in your urine worsens, you develop worsening fever, back pain or pelvic pain or if your symptoms do not resolve after completing the antibiotic.  Urinary tract infections can be prevented by drinking plenty of water to keep your body hydrated.  Also be sure when you wipe, wipe from front to back and don't hold it in!  If possible, empty your bladder every 4 hours.  Your e-visit answers were reviewed by a board certified advanced clinical practitioner to complete your personal care plan.  Depending on the condition, your plan could have included both over the counter or prescription medications.  If there is a problem please reply  once you have received a response from your provider.  Your safety is important to [place].  If you have drug allergies check your prescription carefully.    You can use MyChart to ask questions about today's visit, request a non-urgent call back, or ask for a work or school excuse for 24 hours related to this e-Visit. If it has been greater than 24 hours you will need to follow up with your provider, or enter a new e-Visit to address those concerns.   You will get an e-mail in the next two days asking about your experience.  I hope that your e-visit has been valuable and will speed your recovery. Thank you for using e-visits.  Approximately 5 minutes was  spent documenting and reviewing patient's chart.

## 2020-01-04 ENCOUNTER — Ambulatory Visit: Payer: 59 | Admitting: Professional

## 2020-01-05 MED FILL — PROMETHAZINE 12.5 MG TABLET: 12.5 | 5 days supply | Qty: 20 | Fill #1

## 2020-01-05 MED FILL — LORazepam 0.5 MG TABS: 0.5 | 30 days supply | Qty: 45 | Fill #0

## 2020-01-05 MED FILL — GABAPENTIN 300 MG CAPSULE: 300 | 30 days supply | Qty: 120 | Fill #0

## 2020-01-09 ENCOUNTER — Telehealth: Payer: 59 | Admitting: Family

## 2020-01-09 DIAGNOSIS — W57XXXA Bitten or stung by nonvenomous insect and other nonvenomous arthropods, initial encounter: Secondary | ICD-10-CM | POA: Diagnosis not present

## 2020-01-09 DIAGNOSIS — S60869A Insect bite (nonvenomous) of unspecified wrist, initial encounter: Secondary | ICD-10-CM | POA: Diagnosis not present

## 2020-01-09 MED ORDER — DOXYCYCLINE HYCLATE 100 MG PO TABS: 100.0000 mg | ORAL_TABLET | Freq: Two times a day (BID) | ORAL | 0 refills | Status: DC

## 2020-01-09 NOTE — Progress Notes (Signed)
E Visit for Insect Sting  Thank you for describing the insect sting for [place].  Here is how we plan to help!  It looks like you have some type of insect bite that has become infected. I have sent a prescription of doxycycline 100 mg that you will take twice a day for 10 days. If this area worsens or does not improve, you will need to be seen face to face to be evaluated.     What can be used to prevent Insect Stings?   Insect repellant with at least 20% DEET.    Wearing long pants and shirts with socks and shoes.    Wear dark or drab-colored clothes rather than bright colors.    Avoid using perfumes and hair sprays; these attract insects.  HOME CARE ADVICE:  1. Stinger removal:  The stinger looks like a tiny black dot in the sting.  Use a fingernail, credit card edge, or knife-edge to scrape it off.  Don't pull it out because it squeezes out more venom.  If the stinger is below the skin surface, leave it alone.  It will be shed with normal skin healing. 2. Use cold compresses to the area of the sting for 10-20 minutes.  You may repeat this as needed to relieve symptoms of pain and swelling. 3.  For pain relief, take acetominophen 650 mg 4-6 hours as needed or ibuprofen 400 mg every 6-8 hours as needed or naproxen 250-500 mg every 12 hours as needed. 4.  You can also use hydrocortisone cream 0.5% or 1% up to 4 times daily as needed for itching. 5.  If the sting becomes very itchy, take Benadryl 25-50 mg, follow directions on box. 6.  Wash the area 2-3 times daily with antibacterial soap and warm water. 7. Call your Doctor if:  Fever, a severe headache, or rash occur in the next 2 weeks.  Sting area begins to look infected.  Redness and swelling worsens after home treatment.  Your current symptoms become worse.    MAKE SURE YOU:   Understand these instructions.  Will watch your condition.  Will get help right away if you are not doing well or get worse.  Thank  you for choosing an e-visit. Your e-visit answers were reviewed by a board certified advanced clinical practitioner to complete your personal care plan. Depending upon the condition, your plan could have included both over the counter or prescription medications. Please review your pharmacy choice. Be sure that the pharmacy you have chosen is open so that you can pick up your prescription now.  If there is a problem you may message your provider in [place] to have the prescription routed to another pharmacy. Your safety is important to [place]. If you have drug allergies check your prescription carefully.  For the next 24 hours, you can use MyChart to ask questions about today's visit, request a non-urgent call back, or ask for a work or school excuse from your e-visit provider. You will get an email in the next two days asking about your experience. I hope that your e-visit has been valuable and will speed your recovery.  Approximately 5 minutes was spent documenting and reviewing patient's chart.

## 2020-01-13 ENCOUNTER — Ambulatory Visit: Payer: 59 | Admitting: Professional

## 2020-01-14 ENCOUNTER — Other Ambulatory Visit
Admission: RE | Admit: 2020-01-14 | Discharge: 2020-01-14 | Disposition: A | Discharge status: Home / Self Care | Payer: 59 | Source: Ambulatory Visit | Attending: Internal Medicine | Admitting: Internal Medicine

## 2020-01-14 ENCOUNTER — Telehealth: Payer: 59 | Admitting: Physician Assistant

## 2020-01-14 ENCOUNTER — Ambulatory Visit
Admission: RE | Admit: 2020-01-14 | Discharge: 2020-01-14 | Disposition: A | Discharge status: Home / Self Care | Payer: 59 | Source: Ambulatory Visit | Attending: Emergency Medicine | Admitting: Emergency Medicine

## 2020-01-14 ENCOUNTER — Other Ambulatory Visit: Payer: Self-pay

## 2020-01-14 ENCOUNTER — Ambulatory Visit (INDEPENDENT_AMBULATORY_CARE_PROVIDER_SITE_OTHER): Payer: 59

## 2020-01-14 VITALS — BP 102/58 | HR 88 | Temp 98.1°F | Resp 16

## 2020-01-14 DIAGNOSIS — W57XXXA Bitten or stung by nonvenomous insect and other nonvenomous arthropods, initial encounter: Secondary | ICD-10-CM | POA: Diagnosis not present

## 2020-01-14 DIAGNOSIS — S51801A Unspecified open wound of right forearm, initial encounter: Secondary | ICD-10-CM | POA: Insufficient documentation

## 2020-01-14 DIAGNOSIS — S61501A Unspecified open wound of right wrist, initial encounter: Secondary | ICD-10-CM | POA: Diagnosis not present

## 2020-01-14 DIAGNOSIS — S60911A Unspecified superficial injury of right wrist, initial encounter: Secondary | ICD-10-CM | POA: Diagnosis not present

## 2020-01-14 DIAGNOSIS — L98499 Non-pressure chronic ulcer of skin of other sites with unspecified severity: Secondary | ICD-10-CM | POA: Diagnosis not present

## 2020-01-14 DIAGNOSIS — R5381 Other malaise: Secondary | ICD-10-CM | POA: Insufficient documentation

## 2020-01-14 DIAGNOSIS — L03113 Cellulitis of right upper limb: Secondary | ICD-10-CM

## 2020-01-14 DIAGNOSIS — M7989 Other specified soft tissue disorders: Secondary | ICD-10-CM | POA: Diagnosis not present

## 2020-01-14 LAB — COMPREHENSIVE METABOLIC PANEL
ALT: 42 U/L (ref 0–44)
AST: 33 U/L (ref 15–41)
Albumin: 3.9 g/dL (ref 3.5–5.0)
Alkaline Phosphatase: 96 U/L (ref 38–126)
Anion gap: 10 (ref 5–15)
BUN: 12 mg/dL (ref 6–20)
CO2: 21 mmol/L — ABNORMAL LOW (ref 22–32)
Calcium: 8.8 mg/dL — ABNORMAL LOW (ref 8.9–10.3)
Chloride: 114 mmol/L — ABNORMAL HIGH (ref 98–111)
Creatinine, Ser: 0.81 mg/dL (ref 0.44–1.00)
GFR calc Af Amer: >60 mL/min (ref >60)
GFR calc non Af Amer: >60 mL/min (ref >60)
Glucose, Bld: 95 mg/dL (ref 70–99)
Potassium: 4.3 mmol/L (ref 3.5–5.1)
Sodium: 145 mmol/L (ref 135–145)
Total Bilirubin: 0.6 mg/dL (ref 0.3–1.2)
Total Protein: 6.2 g/dL — ABNORMAL LOW (ref 6.5–8.1)

## 2020-01-14 LAB — CBC WITH DIFFERENTIAL/PLATELET
Abs Immature Granulocytes: 0.01 10*3/uL (ref 0.00–0.07)
Basophils Absolute: 0 10*3/uL (ref 0.0–0.1)
Basophils Relative: 0 %
Eosinophils Absolute: 0.1 10*3/uL (ref 0.0–0.5)
Eosinophils Relative: 2 %
HCT: 47.1 % — ABNORMAL HIGH (ref 36.0–46.0)
Hemoglobin: 14.7 g/dL (ref 12.0–15.0)
Immature Granulocytes: 0 %
Lymphocytes Relative: 26 %
Lymphs Abs: 1.3 10*3/uL (ref 0.7–4.0)
MCH: 31.5 pg (ref 26.0–34.0)
MCHC: 31.2 g/dL (ref 30.0–36.0)
MCV: 100.9 fL — ABNORMAL HIGH (ref 80.0–100.0)
Monocytes Absolute: 0.3 10*3/uL (ref 0.1–1.0)
Monocytes Relative: 7 %
Neutro Abs: 3.4 10*3/uL (ref 1.7–7.7)
Neutrophils Relative %: 65 %
Platelets: 64 10*3/uL — ABNORMAL LOW (ref 150–400)
RBC: 4.67 MIL/uL (ref 3.87–5.11)
RDW: 13.5 % (ref 11.5–15.5)
WBC: 5.1 10*3/uL (ref 4.0–10.5)
nRBC: 0 % (ref 0.0–0.2)

## 2020-01-14 MED ORDER — MUPIROCIN CALCIUM 2 % EX CREA
1.0000 | TOPICAL_CREAM | Freq: Two times a day (BID) | CUTANEOUS | 0 refills | Status: DC
Start: 2020-01-14 — End: 2020-01-29

## 2020-01-14 NOTE — ED Provider Notes (Signed)
[facility]    CSN: [id number] Arrival date & time: 01/14/20  1142      History   Chief Complaint Chief Complaint  Patient presents with  . Appointment  . Insect Bite    HPI Linda Becker is a 29 y.o. female presenting for wound check.  States that over 1 week ago she developed a bug bite on her right wrist, dorsal aspect: Has slowly been worsening.  Did a televisit, given doxycycline.  Has been taking this for a couple days without improvement.  Patient notes cough, nasal congestion, joint pain, malaise.  Patient also notes nausea and vomiting last night: None today.  Reports good oral intake.  No chest pain, difficulty breathing, severe abdominal pain, fevers.    Past Medical History:  Diagnosis Date  . Allergic rhinitis    remote hx of allergy shots  . Asthma    since childhood  on controller meds extrinsic dr Carmelina Peal  . Family history of adverse reaction to anesthesia   . Gallbladder sludge   . Gastropathy   . GERD (gastroesophageal reflux disease)    on nexium  for  long term sx since childhood  . H/O miscarriage, not currently pregnant    [redacted] weeks  march 2016  . Hepatic steatosis   . Hypothyroidism   . Migraine   . Murmur    pt reports MVP  . Sessile colonic polyp   . Splenomegaly   . Steatohepatitis   . Syncope    under eval ? cause  . Tachycardia    episodes with near syncope eval dr Nadyne Coombes  on no meds dced LABA    Patient Active Problem List   Diagnosis Date Noted  . Dehydration 07/22/2019  . Portal hypertension ([place])   . Chronic diarrhea   . Benign neoplasm of ascending colon   . Benign neoplasm of transverse colon   . Benign neoplasm of rectum   . Thrombocytopenia ([place]) 08/12/2018  . Spontaneous vaginal delivery 12/22/2017  . IUFD at 37 weeks or more of gestation 12/20/2017  . Snoring 11/10/2017  . Gestational diabetes 10/12/2017  . Morbid obesity ([place]) 05/19/2017  . Adult hypothyroidism 05/19/2017  . BMI 40.0-44.9, adult ([place])  11/09/2015  . GERD (gastroesophageal reflux disease)   . Tachycardia   . Syncope   . Allergic rhinitis   . Persistent asthma with undetermined severity   . Migraine with aura 09/26/2011    Past Surgical History:  Procedure Laterality Date  . BIOPSY  09/03/2018   Procedure: BIOPSY;  Surgeon: Jerene Bears, MD;  Location: [facility];  Service: Gastroenterology;;  . broken right femur  2008   rod placed  . COLONOSCOPY WITH PROPOFOL N/A 09/03/2018   Procedure: COLONOSCOPY WITH PROPOFOL;  Surgeon: Jerene Bears, MD;  Location: [facility];  Service: Gastroenterology;  Laterality: N/A;  . DILATION AND CURETTAGE OF UTERUS    . ESOPHAGOGASTRODUODENOSCOPY (EGD) WITH PROPOFOL N/A 09/03/2018   Procedure: ESOPHAGOGASTRODUODENOSCOPY (EGD) WITH PROPOFOL;  Surgeon: Jerene Bears, MD;  Location: [facility];  Service: Gastroenterology;  Laterality: N/A;  . IR TRANSCATHETER BX  01/19/2019  . IR US GUIDE VASC ACCESS RIGHT  01/19/2019  . IR VENOGRAM HEPATIC W HEMODYNAMIC EVALUATION  01/19/2019  . LAPAROSCOPIC GASTRIC SLEEVE RESECTION N/A 06/28/2019   Procedure: LAPAROSCOPIC GASTRIC SLEEVE RESECTION, Upper Endo, ERAS Pathway;  Surgeon: Clovis Riley, MD;  Location: [facility];  Service: General;  Laterality: N/A;  . OB ultrasound N/A 12/01/2017   see report  .  POLYPECTOMY  09/03/2018   Procedure: POLYPECTOMY;  Surgeon: Jerene Bears, MD;  Location: [facility];  Service: Gastroenterology;;  . Jacques Earthly  . WISDOM TOOTH EXTRACTION      OB History    Gravida  2   Para  1   Term      Preterm  1   AB  1   Living  0     SAB  1   TAB      Ectopic      Multiple  0   Live Births               Home Medications    Prior to Admission medications   Medication Sig Start Date End Date Taking? Authorizing Provider  albuterol (PROVENTIL) (2.5 MG/3ML) 0.083% nebulizer solution Use 1 vial nebulized every 4-6 hours as needed for cough, wheeze, shortness of breath or chest  tightness Patient taking differently: Take 2.5 mg by nebulization every 4 (four) hours as needed for wheezing or shortness of breath.  08/09/18   Kozlow, Donnamarie Poag, MD  albuterol (VENTOLIN HFA) 108 (90 Base) MCG/ACT inhaler Can inhale two puffs every four to six hours as needed for cough or wheeze. 09/29/19   Kozlow, Donnamarie Poag, MD  Ascorbic Acid (VITAMIN C WITH ROSE HIPS) 500 MG tablet Take 500 mg by mouth 2 (two) times daily.    [provider]  Beclomethasone Dipropionate (QNASL) 80 MCG/ACT AERS INSTILL 1 SPRAY IN EACH NOSTRIL ONCE DAILY AS DIRECTED 09/29/19   Kozlow, Donnamarie Poag, MD  calcium carbonate (OSCAL) 1500 (600 Ca) MG TABS tablet Take 600 mg of elemental calcium by mouth 2 (two) times daily with a meal.    [provider]  cetirizine (ZYRTEC) 5 MG tablet Take 5 mg by mouth 2 (two) times daily.    [provider]  doxycycline (VIBRA-TABS) 100 MG tablet Take 1 tablet (100 mg total) by mouth 2 (two) times daily. 01/09/20   Evelina Dun A, FNP  dupilumab (DUPIXENT) 300 MG/2ML prefilled syringe Inject 300 mg into the skin every 14 (fourteen) days. 10/06/19   Tresa Garter, MD  lamoTRIgine (LAMICTAL) 150 MG tablet Take 150 mg by mouth daily.    [provider]  levothyroxine (SYNTHROID) 88 MCG tablet TAKE 1 TABLET BY MOUTH DAILY. Patient taking differently: Take 88 mcg by mouth daily before breakfast.  01/29/19   Elayne Snare, MD  montelukast (SINGULAIR) 10 MG tablet Take 1 tablet (10 mg total) by mouth daily. 09/29/19   Kozlow, Donnamarie Poag, MD  mupirocin cream (BACTROBAN) 2 % Apply 1 application topically 2 (two) times daily. 01/14/20   Hall-Potvin, [name], PA-C  Olopatadine HCl 0.2 % SOLN Can use one drop in each eye once daily if needed. 09/29/19   Kozlow, Donnamarie Poag, MD  pantoprazole (PROTONIX) 40 MG tablet Take 1 tablet (40 mg total) by mouth daily. 06/29/19   Clovis Riley, MD  Prenatal Vit-Fe Fumarate-FA (MULTIVITAMIN-PRENATAL) 27-0.8 MG TABS tablet Take 1 tablet by  mouth 2 (two) times daily.    [provider]  SYMBICORT 160-4.5 MCG/ACT inhaler INHALE 2 PUFFS BY MOUTH TWICE A DAY TO PREVENT COUGH OR WHEEZE. RINSE, GARGLE, AND SPIT AFTER USE. 09/29/19   Kozlow, Donnamarie Poag, MD  vitamin E 400 UNIT capsule Take 400 Units by mouth 2 (two) times daily.    [provider]    Family History Family History  Problem Relation Age of Onset  . Asthma Father   .  Arthritis Father   . Hyperlipidemia Father   . Heart disease Father   . Hypertension Father   . Diabetes Father   . Colon polyps Father   . Arthritis Mother   . Hyperlipidemia Mother   . Heart disease Mother   . Hypertension Mother   . Diabetes Mother   . Kidney disease Mother   . Liver disease Mother   . Bleeding Disorder Mother   . Colon polyps Mother   . Arthritis Maternal Grandmother   . Breast cancer Maternal Grandmother   . Thyroid disease Maternal Grandmother   . Colon cancer Paternal Grandfather   . Diabetes Paternal Grandfather   . Liver cancer Paternal Grandfather   . Stomach cancer Paternal Grandfather   . Breast cancer Paternal Grandmother   . Diabetes Paternal Grandmother   . Breast cancer Paternal Aunt   . Esophageal cancer Paternal Aunt   . Breast cancer Maternal Aunt   . Diabetes Maternal Grandfather   . Pancreatic cancer Maternal Grandfather     Social History Social History   Tobacco Use  . Smoking status: Never Smoker  . Smokeless tobacco: Never Used  Vaping Use  . Vaping Use: Never used  Substance Use Topics  . Alcohol use: No    Alcohol/week: 0.0 standard drinks  . Drug use: No     Allergies   Progesterone and Penicillins   Review of Systems Review of Systems   Physical Exam Triage Vital Signs ED Triage Vitals  Enc Vitals Group     BP 01/14/20 1219 (!) 102/58     Pulse Rate 01/14/20 1219 88     Resp 01/14/20 1219 16     Temp 01/14/20 1219 98.1 F (36.7 C)     Temp Source 01/14/20 1219 Oral     SpO2 01/14/20 1219 98 %      Weight --      Height --      Head Circumference --      Peak Flow --      Pain Score 01/14/20 1231 7     Pain Loc --      Pain Edu? --      Excl. in [place]? --    No data found.  Updated Vital Signs BP (!) 102/58 (BP Location: Left Arm)   Pulse 88   Temp 98.1 F (36.7 C) (Oral)   Resp 16   LMP 01/09/2020   SpO2 98%   Visual Acuity Right Eye Distance:   Left Eye Distance:   Bilateral Distance:    Right Eye Near:   Left Eye Near:    Bilateral Near:     Physical Exam Constitutional:      General: She is not in acute distress. HENT:     Head: Normocephalic and atraumatic.  Eyes:     General: No scleral icterus.    Pupils: Pupils are equal, round, and reactive to light.  Cardiovascular:     Rate and Rhythm: Normal rate.  Pulmonary:     Effort: Pulmonary effort is normal.  Skin:    Coloration: Skin is not jaundiced or pale.     Comments: Wound debrided under local anesthesia by me: See photo  Neurological:     Mental Status: She is alert and oriented to person, place, and time.          UC Treatments / Results  Labs (all labs ordered are listed, but only abnormal results are displayed) Labs Reviewed  CBC WITH DIFFERENTIAL/PLATELET -  Abnormal; Notable for the following components:      Result Value   Hematocrit 47.1 (*)    MCV 100.9 (*)    Platelets 64 (*)    All other components within normal limits   Narrative:    Performed at:  [facility] [address] [place], [place]  [id number] Lab Director: Emeterio Reeve MD, Phone:  [phone]  COMPREHENSIVE METABOLIC PANEL - Abnormal; Notable for the following components:   Chloride 114 (*)    CO2 21 (*)    Calcium 8.8 (*)    Total Protein 6.2 (*)    All other components within normal limits   Narrative:    Performed at:  [facility] [address], [place], [place]  [id number] Lab Director: Emeterio Reeve MD, Phone:  [phone]  AEROBIC CULTURE ([place])    EKG   Radiology DG Wrist Complete Right  Result Date: 01/14/2020 CLINICAL DATA:  29 year old presenting with a wound over the dorsum of the RIGHT wrist associated with pain and swelling that began approximately 10 days ago. EXAM: RIGHT WRIST - COMPLETE 3+ VIEW COMPARISON:  None. FINDINGS: Soft tissue swelling overlying the distal ulna and the ulnar side of the wrist, with an ulceration in the dorsal soft tissues. No underlying osseous abnormality. No evidence of osteomyelitis. No acute, subacute or healed fractures. Well-preserved joint spaces. Well-preserved bone mineral density. IMPRESSION: 1. No osseous abnormality. 2. Soft tissue swelling overlying the distal ulna and the ulnar side of the wrist, with an ulceration in the dorsal soft tissues. Electronically Signed   By: Evangeline Dakin M.D.   On: 01/14/2020 14:26    Procedures Procedures (including critical care time)  Medications Ordered in UC Medications - No data to display  Initial Impression / Assessment and Plan / UC Course  I have reviewed the triage vital signs and the nursing notes.  Pertinent labs & imaging results that were available during my care of the patient were reviewed by me and considered in my medical decision making (see chart for details).     Duration of symptoms, significant pain over bony prominence, x-ray obtained: Negative for osseous abnormality, though does have soft tissue swelling overlying distal ulna and ulnar side of wrist with ulceration of the dorsal soft tissues.  Patient does have good granulation tissue without obvious necrosis.  No malodor or purulence.  Patient does have history of thrombocytopenia, hence perilesional bruising from administration of local anesthetic.  Will obtain basic labs, have patient continue doxycycline, and follow-up with PCP in 1 week.  ER return precautions discussed, pt verbalized understanding and is agreeable to plan. Final Clinical Impressions(s) / UC  Diagnoses   Final diagnoses:  Open wound of right forearm, initial encounter  Malaise     Discharge Instructions     Will call you with lab results later tonight/tomorrow. Continue doxycycline as prescribed. Keep wound clean/dry, apply Bactroban 2 times a day. Follow up with PCP in 1 week. Go to ER for worsening malaise, weakness, chest pain, difficulty breathing, worsening rash/wound, malodor.    ED Prescriptions    Medication Sig Dispense Auth. Provider   mupirocin cream (BACTROBAN) 2 % Apply 1 application topically 2 (two) times daily. 15 g Hall-Potvin, [name], PA-C     PDMP not reviewed this encounter.   Neldon Mc [place], [place] 01/15/20 6720

## 2020-01-14 NOTE — ED Triage Notes (Signed)
Patient presents to [facility] for assessment of continued issues with a possible insect bite to the right wrist.  Patient states currently taking doxycycline, and it has not been improving.  C/o now cough, nasal congestion, joint pain, and malaise.  C/o nausea, and multiple episodes (10+) of vomiting last night.

## 2020-01-14 NOTE — Progress Notes (Signed)
Based on what you shared with me, I feel your condition warrants further evaluation and I recommend that you be seen for a face to face office visit.  Im sorry you are not doing well. Given that the infection continues to worsen despite oral antibiotics you will need to be seen in person for further evaluation and management.    NOTE: If you entered your credit card information for this eVisit, you will not be charged. You may see a "hold" on your card for the $35 but that hold will drop off and you will not have a charge processed.   If you are having a true medical emergency please call 911.      For an urgent face to face visit, [facility] has five urgent care centers for your convenience:      NEW:  [facility] [place] [facility] [place] Get Driving Directions [phone] [address] [place], [place] [address] . 10 am - 6pm Monday - Friday    [facility]) Get Driving Directions [phone] [address] [place], [place] [address] . 10 am to 8 pm Monday-Friday . 12 pm to 8 pm [facility] Get Driving Directions [phone] [place], [place], [place] [address] . 8 am to 8 pm Monday-Friday . 9 am to 6 pm Saturday . 11 am to 6 pm Sunday     [facility] at [facility] Get Driving Directions  [phone] [address] [place], [place] [address] . 8 am to 8 pm Monday-Friday . 8 am to 4 pm [facility] [place] Get Driving Directions [phone] [address] [place], [place] [address] . 12 pm to 6 pm Monday-Friday      Your e-visit answers were reviewed by a board certified advanced clinical practitioner to complete your personal care plan.  Thank you for using e-Visits.   Greater than 5 minutes, yet less than 10 minutes of time have been spent researching,  coordinating, and implementing care for this patient today

## 2020-01-14 NOTE — Discharge Instructions (Addendum)
Will call you with lab results later tonight/tomorrow. Continue doxycycline as prescribed. Keep wound clean/dry, apply Bactroban 2 times a day. Follow up with PCP in 1 week. Go to ER for worsening malaise, weakness, chest pain, difficulty breathing, worsening rash/wound, malodor.

## 2020-01-15 ENCOUNTER — Encounter: Payer: Self-pay | Admitting: Allergy and Immunology

## 2020-01-15 ENCOUNTER — Ambulatory Visit
Admission: EM | Admit: 2020-01-15 | Discharge: 2020-01-15 | Disposition: A | Discharge status: Home / Self Care | Payer: 59 | Attending: Emergency Medicine | Admitting: Emergency Medicine

## 2020-01-15 DIAGNOSIS — R5381 Other malaise: Secondary | ICD-10-CM

## 2020-01-15 LAB — CBC WITH DIFFERENTIAL/PLATELET
Basophils Absolute: 0 10*3/uL (ref 0.0–0.1)
Basos: 0 %
EOS (ABSOLUTE): 0.1 10*3/uL (ref 0.0–0.5)
Eos: 2 %
Hematocrit: 47.1 % — ABNORMAL HIGH (ref 36.0–46.0)
Hemoglobin: 14.7 g/dL (ref 12.0–15.0)
Immature Grans (Abs): 0.01 10*3/uL (ref 0.00–0.07)
Lymphocytes Absolute: 1.3 10*3/uL (ref 0.7–4.0)
Lymphs: 26 %
MCH: 31.5 pg (ref 26.0–34.0)
MCHC: 31.2 g/dL (ref 30.0–36.0)
MCV: 100.9 fL — ABNORMAL HIGH (ref 80.0–100.0)
Monocytes Absolute: 0.3 10*3/uL (ref 0.1–1.0)
Monocytes: 7 %
Neutrophils Absolute: 3.4 10*3/uL (ref 1.7–7.7)
Neutrophils: 65 %
Platelets: 64 10*3/uL — ABNORMAL LOW (ref 150–400)
RBC: 4.67 MIL/uL (ref 3.87–5.11)
RDW: 13.5 % (ref 11.5–15.5)
WBC: 5.1 10*3/uL (ref 4.0–10.5)

## 2020-01-15 LAB — COMPREHENSIVE METABOLIC PANEL
ALT: 42 U/L (ref 0–44)
AST: 33 U/L (ref 15–41)
Albumin: 3.9 g/dL (ref 3.5–5.0)
Alkaline Phosphatase: 96 U/L (ref 38–126)
BUN: 12 mg/dL (ref 6–20)
Bilirubin Total: 0.6 mg/dL (ref 0.3–1.2)
CO2: 21 mmol/L — ABNORMAL LOW (ref 22–32)
Calcium: 8.8 mg/dL — ABNORMAL LOW (ref 8.9–10.3)
Chloride: 114 mmol/L — ABNORMAL HIGH (ref 98–111)
Creatinine, Ser: 0.81 mg/dL (ref 0.44–1.00)
GFR calc Af Amer: >60 mL/min (ref >60)
GFR calc non Af Amer: >60 mL/min (ref >60)
Glucose: 95 mg/dL (ref 70–99)
Potassium: 4.3 mmol/L (ref 3.5–5.1)
Sodium: 145 mmol/L (ref 135–145)
Total Protein: 6.2 g/dL — ABNORMAL LOW (ref 6.5–8.1)

## 2020-01-15 MED ORDER — BENZONATATE 100 MG PO CAPS
100.0000 mg | ORAL_CAPSULE | Freq: Three times a day (TID) | ORAL | 0 refills | Status: DC
Start: 2020-01-15 — End: 2020-01-29

## 2020-01-15 NOTE — ED Triage Notes (Signed)
Pt presents for COVID swab as follow-up to yesterday's visit.

## 2020-01-16 ENCOUNTER — Ambulatory Visit: Payer: 59 | Admitting: Professional

## 2020-01-16 ENCOUNTER — Other Ambulatory Visit: Payer: Self-pay

## 2020-01-16 MED ORDER — PREDNISONE 10 MG PO TABS
ORAL_TABLET | ORAL | 0 refills | Status: DC
Start: 2020-01-16 — End: 2020-01-29

## 2020-01-16 MED FILL — MUPIROCIN 2% CREAM: 2 | 7 days supply | Qty: 15 | Fill #0

## 2020-01-16 NOTE — Telephone Encounter (Addendum)
Patient called to check on the status of her message.   Patient made an appointment tomorrow, 8/3 in [facility] with Dr. Neldon Mc at 10:30am just in case she needed to be seen. Patient denies any fevers or chills and states all her symptoms are allergy related. Patient was tested for COVID yesterday as directed by urgent care. Informed patient we would need results to COVID test before seeing her in office.  Please advise if patient needs to come into the office.

## 2020-01-16 NOTE — Telephone Encounter (Signed)
I sent this message to [name]:  Then by default, assume you have Covid and do the things you need to do such as quarantine away from people, wear a mask as much as possible if people are around, and can take prednisone 10 mg - 1 tablet 1 time per day for 10 days (watch blood sugars). There is a monoclonal antibody available for people with covid less than 10 days and having a positive swab.   Please contact here and provide her with the prednisone

## 2020-01-17 ENCOUNTER — Ambulatory Visit: Payer: 59 | Admitting: Allergy and Immunology

## 2020-01-17 LAB — NOVEL CORONAVIRUS, NAA: SARS-CoV-2, NAA: NOT DETECTED

## 2020-01-17 LAB — SARS-COV-2, NAA 2 DAY TAT

## 2020-01-18 ENCOUNTER — Encounter: Payer: Self-pay | Admitting: Allergy and Immunology

## 2020-01-18 MED FILL — ALBUTEROL SULFATE HFA 108 (: 108 (90 BAS | 16 days supply | Qty: 18 | Fill #1

## 2020-01-18 MED FILL — OLOPATADINE HCL 0.2 % SOLN: 0.2 | 75 days supply | Qty: 8 | Fill #1

## 2020-01-19 LAB — AEROBIC CULTURE W GRAM STAIN (SUPERFICIAL SPECIMEN)
Culture: NO GROWTH
Gram Stain: NONE SEEN
Special Requests: NORMAL

## 2020-01-20 DIAGNOSIS — F4310 Post-traumatic stress disorder, unspecified: Secondary | ICD-10-CM | POA: Diagnosis not present

## 2020-01-20 DIAGNOSIS — F5104 Psychophysiologic insomnia: Secondary | ICD-10-CM | POA: Diagnosis not present

## 2020-01-20 DIAGNOSIS — F319 Bipolar disorder, unspecified: Secondary | ICD-10-CM | POA: Diagnosis not present

## 2020-01-20 DIAGNOSIS — F429 Obsessive-compulsive disorder, unspecified: Secondary | ICD-10-CM | POA: Diagnosis not present

## 2020-01-25 ENCOUNTER — Other Ambulatory Visit: Payer: Self-pay | Admitting: *Deleted

## 2020-01-25 ENCOUNTER — Telehealth: Payer: Self-pay | Admitting: *Deleted

## 2020-01-25 DIAGNOSIS — J45909 Unspecified asthma, uncomplicated: Secondary | ICD-10-CM | POA: Diagnosis not present

## 2020-01-25 DIAGNOSIS — D696 Thrombocytopenia, unspecified: Secondary | ICD-10-CM

## 2020-01-25 NOTE — Telephone Encounter (Signed)
Received call from pt stating recent lab work showed plt of 64 and requesting to be seen by MD for follow up.  Per MD pt needing lab and MD apt within the next 2 weeks.  Message sent to scheduling to scheduling to schedule apt and confirm with pt.

## 2020-01-26 ENCOUNTER — Telehealth: Payer: Self-pay | Admitting: Hematology and Oncology

## 2020-01-26 NOTE — Telephone Encounter (Signed)
Scheduled appointments per 8/11 scheduling message. Patient is aware of appointments date and times.

## 2020-01-27 ENCOUNTER — Observation Stay (HOSPITAL_COMMUNITY): Payer: 59

## 2020-01-27 ENCOUNTER — Emergency Department (HOSPITAL_COMMUNITY): Payer: 59

## 2020-01-27 ENCOUNTER — Encounter (HOSPITAL_COMMUNITY)
Admission: EM | Disposition: A | Discharge status: Home / Self Care | Payer: Self-pay | Source: Home / Self Care | Attending: Internal Medicine

## 2020-01-27 ENCOUNTER — Observation Stay (HOSPITAL_COMMUNITY): Payer: 59 | Admitting: Anesthesiology

## 2020-01-27 ENCOUNTER — Encounter (HOSPITAL_COMMUNITY): Payer: Self-pay | Admitting: Internal Medicine

## 2020-01-27 ENCOUNTER — Other Ambulatory Visit: Payer: Self-pay

## 2020-01-27 ENCOUNTER — Inpatient Hospital Stay (HOSPITAL_COMMUNITY)
Admission: EM | Admit: 2020-01-27 | Discharge: 2020-01-29 | DRG: 432 | Disposition: A | Discharge status: Home / Self Care | Payer: 59 | Attending: Internal Medicine | Admitting: Internal Medicine

## 2020-01-27 DIAGNOSIS — R109 Unspecified abdominal pain: Secondary | ICD-10-CM

## 2020-01-27 DIAGNOSIS — K219 Gastro-esophageal reflux disease without esophagitis: Secondary | ICD-10-CM | POA: Diagnosis present

## 2020-01-27 DIAGNOSIS — Z8719 Personal history of other diseases of the digestive system: Secondary | ICD-10-CM

## 2020-01-27 DIAGNOSIS — Z9884 Bariatric surgery status: Secondary | ICD-10-CM

## 2020-01-27 DIAGNOSIS — K319 Disease of stomach and duodenum, unspecified: Secondary | ICD-10-CM | POA: Diagnosis present

## 2020-01-27 DIAGNOSIS — K922 Gastrointestinal hemorrhage, unspecified: Secondary | ICD-10-CM | POA: Diagnosis present

## 2020-01-27 DIAGNOSIS — Z7951 Long term (current) use of inhaled steroids: Secondary | ICD-10-CM

## 2020-01-27 DIAGNOSIS — Z23 Encounter for immunization: Secondary | ICD-10-CM

## 2020-01-27 DIAGNOSIS — K228 Other specified diseases of esophagus: Secondary | ICD-10-CM | POA: Diagnosis not present

## 2020-01-27 DIAGNOSIS — Z8249 Family history of ischemic heart disease and other diseases of the circulatory system: Secondary | ICD-10-CM

## 2020-01-27 DIAGNOSIS — Z79899 Other long term (current) drug therapy: Secondary | ICD-10-CM

## 2020-01-27 DIAGNOSIS — I8501 Esophageal varices with bleeding: Secondary | ICD-10-CM | POA: Diagnosis not present

## 2020-01-27 DIAGNOSIS — Z8261 Family history of arthritis: Secondary | ICD-10-CM

## 2020-01-27 DIAGNOSIS — K766 Portal hypertension: Secondary | ICD-10-CM | POA: Diagnosis present

## 2020-01-27 DIAGNOSIS — E039 Hypothyroidism, unspecified: Secondary | ICD-10-CM | POA: Diagnosis not present

## 2020-01-27 DIAGNOSIS — J45909 Unspecified asthma, uncomplicated: Secondary | ICD-10-CM | POA: Diagnosis present

## 2020-01-27 DIAGNOSIS — Z841 Family history of disorders of kidney and ureter: Secondary | ICD-10-CM

## 2020-01-27 DIAGNOSIS — R1011 Right upper quadrant pain: Secondary | ICD-10-CM

## 2020-01-27 DIAGNOSIS — K7469 Other cirrhosis of liver: Secondary | ICD-10-CM | POA: Diagnosis not present

## 2020-01-27 DIAGNOSIS — Z20822 Contact with and (suspected) exposure to covid-19: Secondary | ICD-10-CM | POA: Diagnosis not present

## 2020-01-27 DIAGNOSIS — Z79890 Hormone replacement therapy (postmenopausal): Secondary | ICD-10-CM

## 2020-01-27 DIAGNOSIS — K3189 Other diseases of stomach and duodenum: Secondary | ICD-10-CM | POA: Diagnosis not present

## 2020-01-27 DIAGNOSIS — Z6841 Body Mass Index (BMI) 40.0 and over, adult: Secondary | ICD-10-CM

## 2020-01-27 DIAGNOSIS — J45998 Other asthma: Secondary | ICD-10-CM | POA: Diagnosis present

## 2020-01-27 DIAGNOSIS — Z888 Allergy status to other drugs, medicaments and biological substances status: Secondary | ICD-10-CM

## 2020-01-27 DIAGNOSIS — Z880 Allergy status to penicillin: Secondary | ICD-10-CM

## 2020-01-27 DIAGNOSIS — G43909 Migraine, unspecified, not intractable, without status migrainosus: Secondary | ICD-10-CM | POA: Diagnosis present

## 2020-01-27 DIAGNOSIS — D6959 Other secondary thrombocytopenia: Secondary | ICD-10-CM | POA: Diagnosis present

## 2020-01-27 DIAGNOSIS — I8511 Secondary esophageal varices with bleeding: Secondary | ICD-10-CM | POA: Diagnosis not present

## 2020-01-27 DIAGNOSIS — R161 Splenomegaly, not elsewhere classified: Secondary | ICD-10-CM | POA: Diagnosis not present

## 2020-01-27 DIAGNOSIS — D731 Hypersplenism: Secondary | ICD-10-CM | POA: Diagnosis present

## 2020-01-27 DIAGNOSIS — K7581 Nonalcoholic steatohepatitis (NASH): Secondary | ICD-10-CM | POA: Diagnosis present

## 2020-01-27 DIAGNOSIS — K7460 Unspecified cirrhosis of liver: Principal | ICD-10-CM | POA: Diagnosis present

## 2020-01-27 DIAGNOSIS — R110 Nausea: Secondary | ICD-10-CM | POA: Diagnosis not present

## 2020-01-27 DIAGNOSIS — F319 Bipolar disorder, unspecified: Secondary | ICD-10-CM | POA: Diagnosis present

## 2020-01-27 DIAGNOSIS — R1010 Upper abdominal pain, unspecified: Secondary | ICD-10-CM | POA: Diagnosis not present

## 2020-01-27 DIAGNOSIS — Z800 Family history of malignant neoplasm of digestive organs: Secondary | ICD-10-CM

## 2020-01-27 DIAGNOSIS — K920 Hematemesis: Secondary | ICD-10-CM | POA: Diagnosis present

## 2020-01-27 DIAGNOSIS — Z825 Family history of asthma and other chronic lower respiratory diseases: Secondary | ICD-10-CM

## 2020-01-27 DIAGNOSIS — Z803 Family history of malignant neoplasm of breast: Secondary | ICD-10-CM

## 2020-01-27 DIAGNOSIS — Z833 Family history of diabetes mellitus: Secondary | ICD-10-CM

## 2020-01-27 DIAGNOSIS — I8510 Secondary esophageal varices without bleeding: Secondary | ICD-10-CM | POA: Diagnosis not present

## 2020-01-27 DIAGNOSIS — K828 Other specified diseases of gallbladder: Secondary | ICD-10-CM | POA: Diagnosis not present

## 2020-01-27 DIAGNOSIS — Z8371 Family history of colonic polyps: Secondary | ICD-10-CM

## 2020-01-27 DIAGNOSIS — Z832 Family history of diseases of the blood and blood-forming organs and certain disorders involving the immune mechanism: Secondary | ICD-10-CM

## 2020-01-27 DIAGNOSIS — Z83438 Family history of other disorder of lipoprotein metabolism and other lipidemia: Secondary | ICD-10-CM

## 2020-01-27 HISTORY — PX: GASTRIC VARICES BANDING: SHX5519

## 2020-01-27 HISTORY — PX: ESOPHAGOGASTRODUODENOSCOPY (EGD) WITH PROPOFOL: SHX5813

## 2020-01-27 LAB — CBC
HCT: 47.4 % — ABNORMAL HIGH (ref 36.0–46.0)
HCT: 41.3 % (ref 36.0–46.0)
HCT: 44.7 % (ref 36.0–46.0)
Hemoglobin: 15.3 g/dL — ABNORMAL HIGH (ref 12.0–15.0)
Hemoglobin: 13.5 g/dL (ref 12.0–15.0)
Hemoglobin: 14.8 g/dL (ref 12.0–15.0)
MCH: 31 pg (ref 26.0–34.0)
MCH: 31 pg (ref 26.0–34.0)
MCH: 31.7 pg (ref 26.0–34.0)
MCHC: 32.3 g/dL (ref 30.0–36.0)
MCHC: 32.7 g/dL (ref 30.0–36.0)
MCHC: 33.1 g/dL (ref 30.0–36.0)
MCV: 96.1 fL (ref 80.0–100.0)
MCV: 94.9 fL (ref 80.0–100.0)
MCV: 95.7 fL (ref 80.0–100.0)
Platelets: 66 10*3/uL — ABNORMAL LOW (ref 150–400)
Platelets: 52 10*3/uL — ABNORMAL LOW (ref 150–400)
Platelets: 63 10*3/uL — ABNORMAL LOW (ref 150–400)
RBC: 4.93 MIL/uL (ref 3.87–5.11)
RBC: 4.35 MIL/uL (ref 3.87–5.11)
RBC: 4.67 MIL/uL (ref 3.87–5.11)
RDW: 12.5 % (ref 11.5–15.5)
RDW: 12.4 % (ref 11.5–15.5)
RDW: 12.5 % (ref 11.5–15.5)
WBC: 7.6 10*3/uL (ref 4.0–10.5)
WBC: 4.8 10*3/uL (ref 4.0–10.5)
WBC: 7 10*3/uL (ref 4.0–10.5)
nRBC: 0 % (ref 0.0–0.2)
nRBC: 0 % (ref 0.0–0.2)
nRBC: 0 % (ref 0.0–0.2)

## 2020-01-27 LAB — COMPREHENSIVE METABOLIC PANEL
ALT: 93 U/L — ABNORMAL HIGH (ref 0–44)
AST: 66 U/L — ABNORMAL HIGH (ref 15–41)
Albumin: 3.8 g/dL (ref 3.5–5.0)
Alkaline Phosphatase: 115 U/L (ref 38–126)
Anion gap: 8 (ref 5–15)
BUN: 9 mg/dL (ref 6–20)
CO2: 26 mmol/L (ref 22–32)
Calcium: 9 mg/dL (ref 8.9–10.3)
Chloride: 102 mmol/L (ref 98–111)
Creatinine, Ser: 0.8 mg/dL (ref 0.44–1.00)
GFR calc Af Amer: >60 mL/min (ref >60)
GFR calc non Af Amer: >60 mL/min (ref >60)
Glucose, Bld: 103 mg/dL — ABNORMAL HIGH (ref 70–99)
Potassium: 4.2 mmol/L (ref 3.5–5.1)
Sodium: 136 mmol/L (ref 135–145)
Total Bilirubin: 0.7 mg/dL (ref 0.3–1.2)
Total Protein: 6.1 g/dL — ABNORMAL LOW (ref 6.5–8.1)

## 2020-01-27 LAB — I-STAT BETA HCG BLOOD, ED (MC, WL, AP ONLY): I-stat hCG, quantitative: <5 m[IU]/mL (ref <5)

## 2020-01-27 LAB — TYPE AND SCREEN
ABO/RH(D): O POS
Antibody Screen: NEGATIVE

## 2020-01-27 LAB — GLUCOSE, CAPILLARY: Glucose-Capillary: 74 mg/dL (ref 70–99)

## 2020-01-27 LAB — HIV ANTIBODY (ROUTINE TESTING W REFLEX): HIV Screen 4th Generation wRfx: NONREACTIVE

## 2020-01-27 LAB — SARS CORONAVIRUS 2 BY RT PCR (HOSPITAL ORDER, PERFORMED IN ~~LOC~~ HOSPITAL LAB): SARS Coronavirus 2: NEGATIVE

## 2020-01-27 LAB — LIPASE, BLOOD: Lipase: 25 U/L (ref 11–51)

## 2020-01-27 SURGERY — ESOPHAGOGASTRODUODENOSCOPY (EGD) WITH PROPOFOL
Anesthesia: Monitor Anesthesia Care

## 2020-01-27 MED ORDER — HYOSCYAMINE SULFATE 0.125 MG SL SUBL: 0.2500 mg | SUBLINGUAL_TABLET | Freq: Once | SUBLINGUAL | Status: DC

## 2020-01-27 MED ORDER — OXYCODONE-ACETAMINOPHEN 5-325 MG PO TABS
1.0000 | ORAL_TABLET | Freq: Once | ORAL | Status: AC
  Administered 2020-01-27: 1 via ORAL
  Filled 2020-01-27: qty 1

## 2020-01-27 MED ORDER — SODIUM CHLORIDE 0.9 % IV BOLUS
500.0000 mL | Freq: Once | INTRAVENOUS | Status: AC
  Administered 2020-01-27: 500 mL via INTRAVENOUS

## 2020-01-27 MED ORDER — SODIUM CHLORIDE 0.9 % IV SOLN
1.0000 g | Freq: Once | INTRAVENOUS | Status: AC
  Administered 2020-01-27: 1 g via INTRAVENOUS
  Filled 2020-01-27: qty 10

## 2020-01-27 MED ORDER — LIDOCAINE VISCOUS HCL 2 % MT SOLN
15.0000 mL | Freq: Once | OROMUCOSAL | Status: AC
  Administered 2020-01-27: 15 mL via ORAL
  Filled 2020-01-27: qty 15

## 2020-01-27 MED ORDER — TRAMADOL HCL 50 MG PO TABS
50.0000 mg | ORAL_TABLET | Freq: Four times a day (QID) | ORAL | Status: DC | PRN
  Administered 2020-01-27 – 2020-01-29 (×7): 50 mg via ORAL
  Filled 2020-01-27 (×8): qty 1

## 2020-01-27 MED ORDER — SODIUM CHLORIDE 0.9 % IV SOLN
50.0000 ug/h | INTRAVENOUS | Status: DC
  Administered 2020-01-27 – 2020-01-29 (×5): 50 ug/h via INTRAVENOUS
  Filled 2020-01-27 (×6): qty 1

## 2020-01-27 MED ORDER — MIDAZOLAM HCL 2 MG/2ML IJ SOLN
INTRAMUSCULAR | Status: AC
  Filled 2020-01-27: qty 2

## 2020-01-27 MED ORDER — SIMETHICONE 40 MG/0.6ML PO SUSP
40.0000 mg | Freq: Four times a day (QID) | ORAL | Status: DC | PRN
  Administered 2020-01-27 – 2020-01-28 (×2): 40 mg via ORAL
  Filled 2020-01-27 (×3): qty 0.6

## 2020-01-27 MED ORDER — PANTOPRAZOLE SODIUM 40 MG IV SOLR
40.0000 mg | Freq: Two times a day (BID) | INTRAVENOUS | Status: DC
  Administered 2020-01-27 – 2020-01-29 (×4): 40 mg via INTRAVENOUS
  Filled 2020-01-27 (×4): qty 40

## 2020-01-27 MED ORDER — PROPOFOL 10 MG/ML IV BOLUS
INTRAVENOUS | Status: DC | PRN
Start: 2020-01-27 — End: 2020-01-27
  Administered 2020-01-27: 20 mg via INTRAVENOUS
  Administered 2020-01-27: 40 mg via INTRAVENOUS
  Administered 2020-01-27: 20 mg via INTRAVENOUS
  Administered 2020-01-27 (×3): 40 mg via INTRAVENOUS
  Administered 2020-01-27: 20 mg via INTRAVENOUS
  Administered 2020-01-27: 40 mg via INTRAVENOUS
  Administered 2020-01-27 (×2): 20 mg via INTRAVENOUS
  Administered 2020-01-27: 40 mg via INTRAVENOUS
  Administered 2020-01-27: 20 mg via INTRAVENOUS
  Administered 2020-01-27: 40 mg via INTRAVENOUS

## 2020-01-27 MED ORDER — FLUTICASONE PROPIONATE 50 MCG/ACT NA SUSP
2.0000 | Freq: Every day | NASAL | Status: DC
  Administered 2020-01-27 – 2020-01-28 (×2): 2 via NASAL
  Filled 2020-01-27: qty 16

## 2020-01-27 MED ORDER — PROMETHAZINE HCL 25 MG/ML IJ SOLN
12.5000 mg | Freq: Four times a day (QID) | INTRAMUSCULAR | Status: DC | PRN
  Administered 2020-01-27 – 2020-01-29 (×7): 25 mg via INTRAVENOUS
  Filled 2020-01-27 (×7): qty 1

## 2020-01-27 MED ORDER — SODIUM CHLORIDE 0.9 % IV SOLN
2.0000 g | INTRAVENOUS | Status: DC
  Administered 2020-01-28 – 2020-01-29 (×2): 2 g via INTRAVENOUS
  Filled 2020-01-27: qty 20
  Filled 2020-01-27: qty 2
  Filled 2020-01-27: qty 20

## 2020-01-27 MED ORDER — ONDANSETRON HCL 4 MG/2ML IJ SOLN
4.0000 mg | Freq: Four times a day (QID) | INTRAMUSCULAR | Status: DC | PRN
  Administered 2020-01-27 – 2020-01-29 (×7): 4 mg via INTRAVENOUS
  Filled 2020-01-27 (×8): qty 2

## 2020-01-27 MED ORDER — PNEUMOCOCCAL VAC POLYVALENT 25 MCG/0.5ML IJ INJ
0.5000 mL | INJECTION | INTRAMUSCULAR | Status: DC
  Filled 2020-01-27: qty 0.5

## 2020-01-27 MED ORDER — ACETAMINOPHEN 325 MG PO TABS
650.0000 mg | ORAL_TABLET | Freq: Four times a day (QID) | ORAL | Status: DC | PRN
  Administered 2020-01-27 – 2020-01-28 (×3): 650 mg via ORAL
  Filled 2020-01-27 (×3): qty 2

## 2020-01-27 MED ORDER — SODIUM CHLORIDE 0.9 % IV SOLN
8.0000 mg/h | INTRAVENOUS | Status: DC
  Administered 2020-01-27: 8 mg/h via INTRAVENOUS
  Filled 2020-01-27 (×2): qty 80

## 2020-01-27 MED ORDER — OCTREOTIDE LOAD VIA INFUSION
100.0000 ug | Freq: Once | INTRAVENOUS | Status: AC
  Administered 2020-01-27: 100 ug via INTRAVENOUS
  Filled 2020-01-27: qty 50

## 2020-01-27 MED ORDER — FENTANYL CITRATE (PF) 250 MCG/5ML IJ SOLN
INTRAMUSCULAR | Status: AC
  Filled 2020-01-27: qty 5

## 2020-01-27 MED ORDER — ONDANSETRON HCL 4 MG/2ML IJ SOLN
INTRAMUSCULAR | Status: DC | PRN
  Administered 2020-01-27: 4 mg via INTRAVENOUS

## 2020-01-27 MED ORDER — MOMETASONE FURO-FORMOTEROL FUM 200-5 MCG/ACT IN AERO
2.0000 | INHALATION_SPRAY | Freq: Two times a day (BID) | RESPIRATORY_TRACT | Status: DC
  Administered 2020-01-27 – 2020-01-29 (×4): 2 via RESPIRATORY_TRACT
  Filled 2020-01-27: qty 8.8

## 2020-01-27 MED ORDER — MIDAZOLAM HCL 5 MG/5ML IJ SOLN
INTRAMUSCULAR | Status: DC | PRN
  Administered 2020-01-27 (×2): 1 mg via INTRAVENOUS

## 2020-01-27 MED ORDER — HYOSCYAMINE SULFATE 0.125 MG SL SUBL
0.1250 mg | SUBLINGUAL_TABLET | Freq: Four times a day (QID) | SUBLINGUAL | Status: DC | PRN
  Administered 2020-01-27 – 2020-01-28 (×2): 0.125 mg via SUBLINGUAL
  Filled 2020-01-27 (×3): qty 1

## 2020-01-27 MED ORDER — IOHEXOL 300 MG/ML  SOLN
100.0000 mL | Freq: Once | INTRAMUSCULAR | Status: AC | PRN
  Administered 2020-01-27: 100 mL via INTRAVENOUS

## 2020-01-27 MED ORDER — ALUM & MAG HYDROXIDE-SIMETH 200-200-20 MG/5ML PO SUSP
30.0000 mL | Freq: Once | ORAL | Status: AC
  Administered 2020-01-27: 30 mL via ORAL
  Filled 2020-01-27: qty 30

## 2020-01-27 MED ORDER — LACTATED RINGERS IV SOLN: INTRAVENOUS | Status: DC | PRN

## 2020-01-27 MED ORDER — OLOPATADINE HCL 0.1 % OP SOLN
1.0000 [drp] | Freq: Two times a day (BID) | OPHTHALMIC | Status: DC
  Administered 2020-01-27 – 2020-01-28 (×2): 1 [drp] via OPHTHALMIC
  Filled 2020-01-27: qty 5

## 2020-01-27 MED ORDER — MAGIC MOUTHWASH W/LIDOCAINE
10.0000 mL | Freq: Three times a day (TID) | ORAL | Status: DC | PRN
  Filled 2020-01-27: qty 10

## 2020-01-27 MED ORDER — FENTANYL CITRATE (PF) 250 MCG/5ML IJ SOLN
INTRAMUSCULAR | Status: DC | PRN
  Administered 2020-01-27 (×5): 50 ug via INTRAVENOUS

## 2020-01-27 MED ORDER — LIDOCAINE HCL (CARDIAC) PF 100 MG/5ML IV SOSY
PREFILLED_SYRINGE | INTRAVENOUS | Status: DC | PRN
  Administered 2020-01-27: 40 mg via INTRATRACHEAL

## 2020-01-27 MED ORDER — ALBUTEROL SULFATE (2.5 MG/3ML) 0.083% IN NEBU: 3.0000 mL | INHALATION_SOLUTION | Freq: Four times a day (QID) | RESPIRATORY_TRACT | Status: DC | PRN

## 2020-01-27 MED ORDER — METOCLOPRAMIDE HCL 5 MG/ML IJ SOLN
10.0000 mg | Freq: Once | INTRAMUSCULAR | Status: AC
  Administered 2020-01-27: 10 mg via INTRAVENOUS
  Filled 2020-01-27: qty 2

## 2020-01-27 MED ORDER — ONDANSETRON HCL 4 MG PO TABS
4.0000 mg | ORAL_TABLET | Freq: Four times a day (QID) | ORAL | Status: DC | PRN
  Administered 2020-01-27: 4 mg via ORAL
  Filled 2020-01-27: qty 1

## 2020-01-27 MED ORDER — SODIUM CHLORIDE 0.9 % IV SOLN
80.0000 mg | Freq: Once | INTRAVENOUS | Status: AC
  Administered 2020-01-27: 80 mg via INTRAVENOUS
  Filled 2020-01-27: qty 80

## 2020-01-27 SURGICAL SUPPLY — 15 items

## 2020-01-27 NOTE — Interval H&P Note (Signed)
History and Physical Interval Note:  01/27/2020 3:04 PM  Linda Becker  has presented today for surgery, with the diagnosis of Hematemesis.  Cirrhosis.  Small esophageal varices on previous EGD.  The various methods of treatment have been discussed with the patient and family. After consideration of risks, benefits and other options for treatment, the patient has consented to  Procedure(s): ESOPHAGOGASTRODUODENOSCOPY (EGD) WITH PROPOFOL (N/A) as a surgical intervention.  The patient's history has been reviewed, patient examined, no change in status, stable for surgery.  I have reviewed the patient's chart and labs.  Questions were answered to the patient's satisfaction.     [employer]

## 2020-01-27 NOTE — Consult Note (Addendum)
[facility] Consult: 10:34 AM 01/27/2020  LOS: 0 days    Referring Provider: Dr Starla Link Primary Care Physician:  Burnis Medin, MD Primary Gastroenterologist:  Roosevelt Locks NP, Zenovia Jarred MD   Attending physician's note   I have taken a history, examined the patient and reviewed the chart. I agree with the Advanced Practitioner's note, impression and recommendations.  53 yr F with obesity, NASH cirrhosis s/p sleeve gastrectomy, portal HTN, small esophageal varices EGD 2020 admitted after episodes of projectile vomiting and Hematemesis X2 Hemodynamically stable No significant drop in Hgb  Possible Mallory weis tear but will need exclude variceal hemorrhage, anastomotic ulcer or peptic ulcer We will plan to proceed with EGD with Dr. Rush Landmark this afternoon N.p.o.  Continue PPI and octreotide drip No significant ascites, if no further bleeding can discontinue empiric ceftriaxone for SBP prophylaxis  Elevated transaminases secondary to [facility] cirrhosis Distended gallbladder and edema of gallbladder wall likely secondary to hypoalbuminemia/portal hypertension in the setting of cirrhosis.  Patient has no leukocytosis or abdominal pain.  Less likely to be acute cholecystitis Continue to monitor LFT  The risks and benefits as well as alternatives of endoscopic procedure(s) have been discussed and reviewed. All questions answered. The patient agrees to proceed.   Damaris Hippo , MD [phone]    Reason for Consultation:  [place] hematemesis.   HPI: Linda Becker is a 29 y.o. female.  PMH Morbid obesity, 06/2019 bariatric laparoscopic gastric sleeve procedure. Cholestasis of pregnancy leading to fetal demise.  NASH.  Cirrhosis as of CT 06/2019.  Portal hypertension.  Esophageal varices.   Splenomegaly.  Thrombocytopenia.  IBS-D.  GERD.  Asthma.  Bipolar disorder  02/2018 liver biopsy 09/03/2018 EGD: For variceal screening, diarrhea, abdominal pain.  Small, nonbleeding, grade 1, distal esophageal varices.  Small HH.  Erythema, nodularity in the gastric antrum.  Path: no celiac dz, + reactive gastropathy. 09/03/2018 colonoscopy for diarrhea, abdominal pain, anemia. Five, 2 to 5 mm, polyps removed.  Mucosa grossly normal, biopsies obtained.  Path: SSA and HP polyps, no Microscopic or lymphocytic colitis.  Since the gastric sleeve procedure patient has had frequent episodes of nausea and vomiting of mostly bilious material.  She is on Protonix and she uses oral Phenergan prn.  Yesterday she threw up nonbloody material earlier in the day.  In the evening she vomited twice, both episodes produced a moderate to large amount of burgundy blood.  She did not feel dizzy, short of breath, diaphoretic.  No abdominal pain.  BMs were brown earlier yesterday and none since then.  Not currently nauseated or vomiting but does have a worse than usual headache.  Patient has occasional breakthrough acid reflux which has been more prominent in the last couple of days despite PPI. Since her bariatric surgery she has had about a 50 pound weight loss.  Presented to the ED round 2:15 AM.  BP 139/110.  Heart rate 97.  No fever.  Room air sats high 90s to 100%. Hgb 13.5.  MCV 94.  Platelets 52 (wer 64 on 7/31).  WBCs normal. INR 1.1.  BUN/Creat normal.   Mild transaminitis 66/93, normal bili, alk phos, albumin, Lipase. Covid 19 negative.   CTAP w contrast: Cirrhosis.  Portal htn, splenomegaly, esoph varices, collateral vessels upper abdomen.  Hazy edema GB fossa and distended GB, concern for cholecystitis.  ? Peripancreatic inflammation.  Small pelvic FF.    Pt started on Rocephin, octreotide and Protonix drip. She is mentating well.  Family Hx  Mother with advanced nonalcoholic cirrhosis, followed by Dr.  Havery Moros.  Liver cancer, stomach cancer in her paternal grandfather, pancreatic cancer in maternal grandmother.  Esophageal cancer in paternal aunt.  Colon polyps in father  Social Hx Married, lives with her husband.  No children.  No EtOH, no illicit drugs.  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   Past Medical History:  Diagnosis Date  . Allergic rhinitis    remote hx of allergy shots  . Asthma    since childhood  on controller meds extrinsic dr Carmelina Peal  . Family history of adverse reaction to anesthesia   . Gallbladder sludge   . Gastropathy   . GERD (gastroesophageal reflux disease)    on nexium  for  long term sx since childhood  . H/O miscarriage, not currently pregnant    [redacted] weeks  march 2016  . Hepatic steatosis   . Hypothyroidism   . Migraine   . Murmur    pt reports MVP  . Sessile colonic polyp   . Splenomegaly   . Steatohepatitis   . Syncope    under eval ? cause  . Tachycardia    episodes with near syncope eval dr Nadyne Coombes  on no meds dced LABA    Past Surgical History:  Procedure Laterality Date  . BIOPSY  09/03/2018   Procedure: BIOPSY;  Surgeon: Jerene Bears, MD;  Location: [facility];  Service: Gastroenterology;;  . broken right femur  2008   rod placed  . COLONOSCOPY WITH PROPOFOL N/A 09/03/2018   Procedure: COLONOSCOPY WITH PROPOFOL;  Surgeon: Jerene Bears, MD;  Location: [facility];  Service: Gastroenterology;  Laterality: N/A;  . DILATION AND CURETTAGE OF UTERUS    . ESOPHAGOGASTRODUODENOSCOPY (EGD) WITH PROPOFOL N/A 09/03/2018   Procedure: ESOPHAGOGASTRODUODENOSCOPY (EGD) WITH PROPOFOL;  Surgeon: Jerene Bears, MD;  Location: [facility];  Service: Gastroenterology;  Laterality: N/A;  . IR TRANSCATHETER BX  01/19/2019  . IR US GUIDE VASC ACCESS RIGHT  01/19/2019  . IR VENOGRAM HEPATIC W HEMODYNAMIC EVALUATION  01/19/2019  . LAPAROSCOPIC GASTRIC SLEEVE RESECTION N/A 06/28/2019   Procedure: LAPAROSCOPIC GASTRIC SLEEVE RESECTION, Upper Endo,  ERAS Pathway;  Surgeon: Clovis Riley, MD;  Location: [facility];  Service: General;  Laterality: N/A;  . OB ultrasound N/A 12/01/2017   see report  . POLYPECTOMY  09/03/2018   Procedure: POLYPECTOMY;  Surgeon: Jerene Bears, MD;  Location: [facility];  Service: Gastroenterology;;  . Jacques Earthly  . WISDOM TOOTH EXTRACTION      Prior to Admission medications   Medication Sig Start Date End Date Taking? Authorizing Provider  albuterol (PROVENTIL) (2.5 MG/3ML) 0.083% nebulizer solution Use 1 vial nebulized every 4-6 hours as needed for cough, wheeze, shortness of breath or chest tightness Patient taking differently: Take 2.5 mg by nebulization every 4 (four) hours as needed for wheezing or shortness of breath.  08/09/18  Yes Kozlow, Donnamarie Poag, MD  albuterol (VENTOLIN HFA) 108 (90 Base) MCG/ACT inhaler Can inhale two puffs every four to  six hours as needed for cough or wheeze. Patient taking differently: Inhale 2 puffs into the lungs every 6 (six) hours as needed for wheezing or shortness of breath. Can inhale two puffs every four to six hours as needed for cough or wheeze. 09/29/19  Yes Kozlow, Donnamarie Poag, MD  Ascorbic Acid (VITAMIN C WITH ROSE HIPS) 500 MG tablet Take 500 mg by mouth 2 (two) times daily.   Yes [provider]  Beclomethasone Dipropionate (QNASL) 80 MCG/ACT AERS INSTILL 1 SPRAY IN EACH NOSTRIL ONCE DAILY AS DIRECTED Patient taking differently: Place 1 spray into the nose daily. INSTILL 1 SPRAY IN EACH NOSTRIL ONCE DAILY AS DIRECTED 09/29/19  Yes Kozlow, Donnamarie Poag, MD  calcium carbonate (OSCAL) 1500 (600 Ca) MG TABS tablet Take 600 mg of elemental calcium by mouth 2 (two) times daily with a meal.   Yes [provider]  cetirizine (ZYRTEC) 5 MG tablet Take 5 mg by mouth 2 (two) times daily.   Yes [provider]  dupilumab (DUPIXENT) 300 MG/2ML prefilled syringe Inject 300 mg into the skin every 14 (fourteen) days. 10/06/19  Yes Tresa Garter, MD   lamoTRIgine (LAMICTAL) 150 MG tablet Take 150 mg by mouth daily.   Yes [provider]  levothyroxine (SYNTHROID) 88 MCG tablet TAKE 1 TABLET BY MOUTH DAILY. Patient taking differently: Take 88 mcg by mouth daily before breakfast.  01/29/19  Yes Elayne Snare, MD  montelukast (SINGULAIR) 10 MG tablet Take 1 tablet (10 mg total) by mouth daily. 09/29/19  Yes Kozlow, Donnamarie Poag, MD  Olopatadine HCl 0.2 % SOLN Can use one drop in each eye once daily if needed. Patient taking differently: Place 1 drop into both eyes daily as needed (allergies). Can use one drop in each eye once daily if needed. 09/29/19  Yes Kozlow, Donnamarie Poag, MD  pantoprazole (PROTONIX) 40 MG tablet Take 1 tablet (40 mg total) by mouth daily. 06/29/19  Yes Clovis Riley, MD  Prenatal Vit-Fe Fumarate-FA (MULTIVITAMIN-PRENATAL) 27-0.8 MG TABS tablet Take 1 tablet by mouth 2 (two) times daily.   Yes [provider]  SYMBICORT 160-4.5 MCG/ACT inhaler INHALE 2 PUFFS BY MOUTH TWICE A DAY TO PREVENT COUGH OR WHEEZE. RINSE, GARGLE, AND SPIT AFTER USE. 09/29/19  Yes Kozlow, Donnamarie Poag, MD  vitamin E 400 UNIT capsule Take 400 Units by mouth 2 (two) times daily.   Yes [provider]  benzonatate (TESSALON) 100 MG capsule Take 1 capsule (100 mg total) by mouth every 8 (eight) hours. Patient not taking: Reported on 01/27/2020 01/15/20   Hall-Potvin, [name], PA-C  doxycycline (VIBRA-TABS) 100 MG tablet Take 1 tablet (100 mg total) by mouth 2 (two) times daily. Patient not taking: Reported on 01/27/2020 01/09/20   Sharion Balloon, FNP  mupirocin cream (BACTROBAN) 2 % Apply 1 application topically 2 (two) times daily. Patient not taking: Reported on 01/27/2020 01/14/20   Hall-Potvin, [name], PA-C  predniSONE (DELTASONE) 10 MG tablet Take one tablet by mouth once daily for ten days only. Patient not taking: Reported on 01/27/2020 01/16/20   Jiles Prows, MD    Scheduled Meds: . fluticasone  2 spray Each Nare Daily  .  mometasone-formoterol  2 puff Inhalation BID  . olopatadine  1 drop Both Eyes BID   Infusions: . [START ON 01/28/2020] cefTRIAXone (ROCEPHIN)  IV    . octreotide  (SANDOSTATIN)    IV infusion 50 mcg/hr (01/27/20 0346)  . pantoprozole (PROTONIX) infusion 8 mg/hr (01/27/20 0404)  PRN Meds: acetaminophen, albuterol, ondansetron **OR** ondansetron (ZOFRAN) IV   Allergies as of 01/27/2020 - Review Complete 01/27/2020  Allergen Reaction Noted  . Progesterone Rash 05/19/2017  . Penicillins Rash 02/10/2018    Family History  Problem Relation Age of Onset  . Asthma Father   . Arthritis Father   . Hyperlipidemia Father   . Heart disease Father   . Hypertension Father   . Diabetes Father   . Colon polyps Father   . Arthritis Mother   . Hyperlipidemia Mother   . Heart disease Mother   . Hypertension Mother   . Diabetes Mother   . Kidney disease Mother   . Liver disease Mother   . Bleeding Disorder Mother   . Colon polyps Mother   . Arthritis Maternal Grandmother   . Breast cancer Maternal Grandmother   . Thyroid disease Maternal Grandmother   . Colon cancer Paternal Grandfather   . Diabetes Paternal Grandfather   . Liver cancer Paternal Grandfather   . Stomach cancer Paternal Grandfather   . Breast cancer Paternal Grandmother   . Diabetes Paternal Grandmother   . Breast cancer Paternal Aunt   . Esophageal cancer Paternal Aunt   . Breast cancer Maternal Aunt   . Diabetes Maternal Grandfather   . Pancreatic cancer Maternal Grandfather     Social History   Socioeconomic History  . Marital status: Single    Spouse name: Not on file  . Number of children: 1  . Years of education: Not on file  . Highest education level: Not on file  Occupational History  . Not on file  Tobacco Use  . Smoking status: Never Smoker  . Smokeless tobacco: Never Used  Vaping Use  . Vaping Use: Never used  Substance and Sexual Activity  . Alcohol use: No    Alcohol/week: 0.0 standard  drinks  . Drug use: No  . Sexual activity: Yes    Birth control/protection: None  Other Topics Concern  . 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 Difficulty of Paying Living Expenses:   Food Insecurity:   . Worried About Charity fundraiser in the Last Year:   . Arboriculturist in the Last Year:   Transportation Needs:   . Film/video editor (Medical):   Marland Kitchen Lack of Transportation (Non-Medical):   Physical Activity:   . Days of Exercise per Week:   . Minutes of Exercise per Session:   Stress:   . Feeling of Stress :   Social Connections:   . Frequency of Communication with Friends and Family:   . Frequency of Social Gatherings with Friends and Family:   . Attends Religious Services:   . Active Member of Clubs or Organizations:   . Attends Archivist Meetings:   Marland Kitchen Marital Status:   Intimate Partner Violence:   . Fear of Current or Ex-Partner:   . Emotionally Abused:   Marland Kitchen Physically Abused:   . Sexually Abused:     REVIEW OF SYSTEMS: Constitutional: Per HPI.  Some weakness but nothing profound.  ENT:  No nose bleeds Pulm: The last few days a bit more short of breath and mostly nonproductive cough. CV:  No palpitations, no LE edema.  No chest pain GU:  No hematuria, no frequency GI: See HPI Heme: Other than the hematemesis, no unusual bleeding or bruising Transfusions: None Neuro: Headaches as per HPI.  No peripheral tingling or numbness Derm:  No itching, no rash.  Few weeks ago got bitten by a spider, suspicious that it was a brown recluse, on her right forearm, this is healing nicely but scab remains. Endocrine:  No sweats or chills.  No polyuria or  dysuria Immunization: Has not gotten COVID-19 vaccination due to concern about vaccine interaction with her thrombocytopenia. Travel:  None beyond local counties in last few months.    PHYSICAL EXAM: Vital signs in last 24 hours: Vitals:   01/27/20 0132 01/27/20 0255  BP: (!) 139/110 110/90  Pulse: 97 85  Resp: 16 16  Temp: 98.6 F (37 C) 98.6 F (37 C)  SpO2: 98% 100%   Wt Readings from Last 3 Encounters:  01/27/20 124.7 kg  09/29/19 121.6 kg  08/23/19 119.6 kg    General: Pleasant, well-appearing, fully alert, obese, comfortable Head: No facial asymmetry or swelling.  No signs of head trauma. Eyes: No conjunctival pallor or scleral icterus.  EOMI. Ears: No hearing deficit Nose: No discharge or congestion Mouth: Moist, pink, clear oral mucosa.  Good dentition.  Tongue midline. Neck: No JVD, no masses, no thyromegaly Lungs: Clear bilaterally without labored breathing or cough Heart: RRR.  No MRG.  S1, S2 present Abdomen: Obese without tenderness.  No masses.  Do not appreciate HSM, bruits or hernias..   Rectal: Deferred Musc/Skeltl: No joint redness, swelling or gross deformity. Extremities: No CCE. Neurologic: No asterixis, no tremors, no weakness.  Moves all 4 limbs.  Fully alert and oriented.  Excellent historian. Skin: No telangiectasia, no rashes.  Scabbed over round lesion on R forearm Tattoos:  Several, professional grade Nodes: No cervical or inguinal adenopathy Psych: Pleasant, cooperative, in good spirits.    Intake/Output from previous day: No intake/output data recorded. Intake/Output this shift: No intake/output data recorded.  LAB RESULTS: Recent Labs    01/27/20 0141 01/27/20 0901  WBC 7.6 4.8  HGB 15.3* 13.5  HCT 47.4* 41.3  PLT 66* 52*   BMET Lab Results  Component Value Date   NA 136 01/27/2020   NA 145 01/14/2020   NA 145 01/14/2020   K 4.2 01/27/2020   K 4.3 01/14/2020   K 4.3 01/14/2020   CL 102 01/27/2020   CL 114 (H) 01/14/2020    CL 114 (H) 01/14/2020   CO2 26 01/27/2020   CO2 21 (L) 01/14/2020   CO2 21 (L) 01/14/2020   GLUCOSE 103 (H) 01/27/2020   GLUCOSE 95 01/14/2020   GLUCOSE 95 01/14/2020   BUN 9 01/27/2020   BUN 12 01/14/2020   BUN 12 01/14/2020   CREATININE 0.80 01/27/2020   CREATININE 0.81 01/14/2020   CREATININE 0.81 01/14/2020   CALCIUM 9.0 01/27/2020   CALCIUM 8.8 (L) 01/14/2020   CALCIUM 8.8 (L) 01/14/2020   LFT Recent Labs    01/27/20 0141  PROT 6.1*  ALBUMIN 3.8  AST 66*  ALT 93*  ALKPHOS 115  BILITOT 0.7   PT/INR Lab Results  Component Value Date   INR 1.1 01/19/2019   INR 1.1 (H) 11/26/2018   INR 1.2 (H) 04/01/2018   Hepatitis Panel No results for input(s): HEPBSAG,  HCVAB, [place], HEPBIGM in the last 72 hours. C-Diff No components found for: CDIFF Lipase     Component Value Date/Time   LIPASE 25 01/27/2020 0141    Drugs of Abuse  No results found for: LABOPIA, COCAINSCRNUR, LABBENZ, AMPHETMU, THCU, LABBARB   RADIOLOGY STUDIES: CT ABDOMEN PELVIS W CONTRAST  Result Date: 01/27/2020 CLINICAL DATA:  Abdomen distension with nausea and vomiting EXAM: CT ABDOMEN AND PELVIS WITH CONTRAST TECHNIQUE: Multidetector CT imaging of the abdomen and pelvis was performed using the standard protocol following bolus administration of intravenous contrast. CONTRAST:  142m OMNIPAQUE IOHEXOL 300 MG/ML  SOLN COMPARISON:  Ultrasound 08/02/2019, CT 07/14/2019, 02/25/2018 FINDINGS: Lower chest: Lung bases demonstrate no acute consolidation or pleural effusion. Cardiac size within normal limits. Hepatobiliary: Cirrhotic morphology of the liver. No focal hepatic abnormality. No biliary dilatation. Distended gallbladder with hazy edema at the gallbladder fossa. Generalized edema at the porta hepatis. Pancreas: Questionable mild peripancreatic inflammatory change about the proximal pancreas. Spleen: Markedly enlarged, measuring up to 20 cm AP. Adrenals/Urinary Tract: Adrenal glands are unremarkable.  Kidneys are normal, without renal calculi, focal lesion, or hydronephrosis. Bladder is unremarkable. Stomach/Bowel: Status post gastric sleeve surgery. No evidence for a bowel obstruction or bowel wall thickening. Negative appendix. Vascular/Lymphatic: Recanalized paraumbilical vein. Multiple distal esophageal varices. Tortuous collateral vessels at the splenic hilum. Multiple mildly enlarged porta hepatis lymph nodes measuring up to 10 mm. Gastrohepatic lymph node measuring 17 mm. Multiple right lower quadrant lymph nodes. Reproductive: Uterus unremarkable. Benign-appearing cyst in the left adnexa Other: No free air. Small amount of free fluid in the pelvis. Hazy appearance of the mesentery. Musculoskeletal: No acute or significant osseous findings. IMPRESSION: 1. Cirrhotic morphology of the liver with sequela of portal hypertension including splenomegaly, esophageal varices, and multiple upper abdominal collateral vessels. 2. Distended gallbladder with hazy edema at the gallbladder fossa, raising concern for acute cholecystitis. Suggest correlation with ultrasound. 3. Questionable mild peripancreatic inflammatory change about the proximal pancreas versus edema from inflammatory process at the gallbladder. Correlate with enzymes. 4. Small amount of free fluid in the pelvis. Electronically Signed   By: KDonavan FoilM.D.   On: 01/27/2020 03:49   UKoreaAbdomen Limited RUQ  Result Date: 01/27/2020 CLINICAL DATA:  Right upper quadrant abdominal pain.  Hematemesis. EXAM: ULTRASOUND ABDOMEN LIMITED RIGHT UPPER QUADRANT COMPARISON:  None. FINDINGS: Gallbladder: No gallstones or wall thickening visualized. No sonographic Murphy sign noted by sonographer. Maximal wall thickness is 3 mm, within normal limits. Common bile duct: Diameter: 3 mm, within normal limits Liver: No focal lesion identified. Within normal limits in parenchymal echogenicity. Portal vein is patent on color Doppler imaging with normal direction of blood  flow towards the liver. Other: None. IMPRESSION: Negative right upper quadrant ultrasound. Electronically Signed   By: CSan MorelleM.D.   On: 01/27/2020 05:09     IMPRESSION:   *   Hematemesis.  No anemia.  *   Cirrhosis due to obesity related NASH.  Sequela include esoph varices, thrombocytopenia, splenomegaly.  coags are WNL.     CTAP w ? Acute cholecystitis, ? Pancreatitis.   RUQ ultrasound unremarkable, no GB or liver disease.   Mild transaminitis and normal lipase.   Patient has no abdominal pain or symptomatology to suggest cholecystitis or pancreatitis.  *   Thrombocytopenia, acute on chronic.  Normal INR.  *    Headache   PLAN:     *   Needs EGD.  This is likely to happen this afternoon.  *  1 dose of oxycodone now for headache Continue Protonix drip, octreotide drip, Rocephin, n.p.o.  *    CBC q. 5 AM, 5 PM   Azucena Freed  01/27/2020, 10:34 AM Phone [phone]

## 2020-01-27 NOTE — H&P (Signed)
History and Physical    Linda Becker EXH:[id number] DOB: 10-20-1990 DOA: 01/27/2020  PCP: Burnis Medin, MD  Patient coming from: Home.  Chief Complaint: Hematemesis.  HPI: Linda Becker is a 29 y.o. 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  Denies any abdominal pain fever or chills.  Denies noticing any blood in the stools or black stools.  ED Course: In the ER patient is hemodynamically stable.  Hemoglobin is around 15.3 platelets 66.  AST 66 ALT 93.  CT abdomen pelvis done showed features concerning for cirrhosis of the liver and also possible gallbladder pathology and ultrasound of the abdomen was unremarkable.  Patient on exam does not have any abdominal tenderness.  Patient was started on octreotide and Protonix and admitted for acute GI bleed in the setting of cirrhosis of liver.  EGD done in March 2020 showed varices.  Review of Systems: As per HPI, rest all negative.   Past Medical History:  Diagnosis Date  . Allergic rhinitis    remote hx of allergy shots  . Asthma    since childhood  on controller meds extrinsic dr Carmelina Peal  . Family history of adverse reaction to anesthesia   . Gallbladder sludge   . Gastropathy   . GERD (gastroesophageal reflux disease)    on nexium  for  long term sx since childhood  . H/O miscarriage, not currently pregnant    [redacted] weeks  march 2016  . Hepatic steatosis   . Hypothyroidism   . Migraine   . Murmur    pt reports MVP  . Sessile colonic polyp   . Splenomegaly   . Steatohepatitis   . Syncope    under eval ? cause  . Tachycardia    episodes with near syncope eval dr Nadyne Coombes  on no meds dced LABA    Past Surgical History:  Procedure Laterality Date  . BIOPSY  09/03/2018   Procedure: BIOPSY;  Surgeon: Jerene Bears, MD;  Location: [facility];  Service: Gastroenterology;;  . broken right femur  2008   rod  placed  . COLONOSCOPY WITH PROPOFOL N/A 09/03/2018   Procedure: COLONOSCOPY WITH PROPOFOL;  Surgeon: Jerene Bears, MD;  Location: [facility];  Service: Gastroenterology;  Laterality: N/A;  . DILATION AND CURETTAGE OF UTERUS    . ESOPHAGOGASTRODUODENOSCOPY (EGD) WITH PROPOFOL N/A 09/03/2018   Procedure: ESOPHAGOGASTRODUODENOSCOPY (EGD) WITH PROPOFOL;  Surgeon: Jerene Bears, MD;  Location: [facility];  Service: Gastroenterology;  Laterality: N/A;  . IR TRANSCATHETER BX  01/19/2019  . IR US GUIDE VASC ACCESS RIGHT  01/19/2019  . IR VENOGRAM HEPATIC W HEMODYNAMIC EVALUATION  01/19/2019  . LAPAROSCOPIC GASTRIC SLEEVE RESECTION N/A 06/28/2019   Procedure: LAPAROSCOPIC GASTRIC SLEEVE RESECTION, Upper Endo, ERAS Pathway;  Surgeon: Clovis Riley, MD;  Location: [facility];  Service: General;  Laterality: N/A;  . OB ultrasound N/A 12/01/2017   see report  . POLYPECTOMY  09/03/2018   Procedure: POLYPECTOMY;  Surgeon: Jerene Bears, MD;  Location: [facility];  Service: Gastroenterology;;  . Jacques Earthly  . WISDOM TOOTH EXTRACTION       reports that she has never smoked. She has never used smokeless tobacco. She reports that she does not drink alcohol and does not use drugs.  Allergies  Allergen Reactions  . Progesterone Rash    Was in a form of  birth control.  . Penicillins Rash    Did it involve swelling of the face/tongue/throat, SOB, or low BP? No Did it involve sudden or severe rash/hives, skin peeling, or any reaction on the inside of your mouth or nose? Yes Did you need to seek medical attention at a hospital or doctor's office? No When did it last happen?9 + months If all above answers are "NO", may proceed with cephalosporin use.      Family History  Problem Relation Age of Onset  . Asthma Father   . Arthritis Father   . Hyperlipidemia Father   . Heart disease Father   . Hypertension Father   . Diabetes Father   . Colon polyps Father   . Arthritis Mother   .  Hyperlipidemia Mother   . Heart disease Mother   . Hypertension Mother   . Diabetes Mother   . Kidney disease Mother   . Liver disease Mother   . Bleeding Disorder Mother   . Colon polyps Mother   . Arthritis Maternal Grandmother   . Breast cancer Maternal Grandmother   . Thyroid disease Maternal Grandmother   . Colon cancer Paternal Grandfather   . Diabetes Paternal Grandfather   . Liver cancer Paternal Grandfather   . Stomach cancer Paternal Grandfather   . Breast cancer Paternal Grandmother   . Diabetes Paternal Grandmother   . Breast cancer Paternal Aunt   . Esophageal cancer Paternal Aunt   . Breast cancer Maternal Aunt   . Diabetes Maternal Grandfather   . Pancreatic cancer Maternal Grandfather     Prior to Admission medications   Medication Sig Start Date End Date Taking? Authorizing Provider  albuterol (PROVENTIL) (2.5 MG/3ML) 0.083% nebulizer solution Use 1 vial nebulized every 4-6 hours as needed for cough, wheeze, shortness of breath or chest tightness Patient taking differently: Take 2.5 mg by nebulization every 4 (four) hours as needed for wheezing or shortness of breath.  08/09/18  Yes Kozlow, Donnamarie Poag, MD  albuterol (VENTOLIN HFA) 108 (90 Base) MCG/ACT inhaler Can inhale two puffs every four to six hours as needed for cough or wheeze. Patient taking differently: Inhale 2 puffs into the lungs every 6 (six) hours as needed for wheezing or shortness of breath. Can inhale two puffs every four to six hours as needed for cough or wheeze. 09/29/19  Yes Kozlow, Donnamarie Poag, MD  Ascorbic Acid (VITAMIN C WITH ROSE HIPS) 500 MG tablet Take 500 mg by mouth 2 (two) times daily.   Yes [provider]  Beclomethasone Dipropionate (QNASL) 80 MCG/ACT AERS INSTILL 1 SPRAY IN EACH NOSTRIL ONCE DAILY AS DIRECTED Patient taking differently: Place 1 spray into the nose daily. INSTILL 1 SPRAY IN EACH NOSTRIL ONCE DAILY AS DIRECTED 09/29/19  Yes Kozlow, Donnamarie Poag, MD  calcium carbonate (OSCAL)  1500 (600 Ca) MG TABS tablet Take 600 mg of elemental calcium by mouth 2 (two) times daily with a meal.   Yes [provider]  cetirizine (ZYRTEC) 5 MG tablet Take 5 mg by mouth 2 (two) times daily.   Yes [provider]  dupilumab (DUPIXENT) 300 MG/2ML prefilled syringe Inject 300 mg into the skin every 14 (fourteen) days. 10/06/19  Yes Tresa Garter, MD  lamoTRIgine (LAMICTAL) 150 MG tablet Take 150 mg by mouth daily.   Yes [provider]  levothyroxine (SYNTHROID) 88 MCG tablet TAKE 1 TABLET BY MOUTH DAILY. Patient taking differently: Take 88 mcg by mouth daily before breakfast.  01/29/19  Yes  Elayne Snare, MD  montelukast (SINGULAIR) 10 MG tablet Take 1 tablet (10 mg total) by mouth daily. 09/29/19  Yes Kozlow, Donnamarie Poag, MD  Olopatadine HCl 0.2 % SOLN Can use one drop in each eye once daily if needed. Patient taking differently: Place 1 drop into both eyes daily as needed (allergies). Can use one drop in each eye once daily if needed. 09/29/19  Yes Kozlow, Donnamarie Poag, MD  pantoprazole (PROTONIX) 40 MG tablet Take 1 tablet (40 mg total) by mouth daily. 06/29/19  Yes Clovis Riley, MD  Prenatal Vit-Fe Fumarate-FA (MULTIVITAMIN-PRENATAL) 27-0.8 MG TABS tablet Take 1 tablet by mouth 2 (two) times daily.   Yes [provider]  SYMBICORT 160-4.5 MCG/ACT inhaler INHALE 2 PUFFS BY MOUTH TWICE A DAY TO PREVENT COUGH OR WHEEZE. RINSE, GARGLE, AND SPIT AFTER USE. 09/29/19  Yes Kozlow, Donnamarie Poag, MD  vitamin E 400 UNIT capsule Take 400 Units by mouth 2 (two) times daily.   Yes [provider]  benzonatate (TESSALON) 100 MG capsule Take 1 capsule (100 mg total) by mouth every 8 (eight) hours. Patient not taking: Reported on 01/27/2020 01/15/20   Hall-Potvin, [name], PA-C  doxycycline (VIBRA-TABS) 100 MG tablet Take 1 tablet (100 mg total) by mouth 2 (two) times daily. Patient not taking: Reported on 01/27/2020 01/09/20   Sharion Balloon, FNP  mupirocin cream (BACTROBAN)  2 % Apply 1 application topically 2 (two) times daily. Patient not taking: Reported on 01/27/2020 01/14/20   Hall-Potvin, [name], PA-C  predniSONE (DELTASONE) 10 MG tablet Take one tablet by mouth once daily for ten days only. Patient not taking: Reported on 01/27/2020 01/16/20   Jiles Prows, MD    Physical Exam: Constitutional: Moderately built and nourished. Vitals:   01/27/20 0132 01/27/20 0255  BP: (!) 139/110 110/90  Pulse: 97 85  Resp: 16 16  Temp: 98.6 F (37 C) 98.6 F (37 C)  TempSrc: Oral Oral  SpO2: 98% 100%   Eyes: Anicteric no pallor. ENMT: No discharge from the ears eyes nose or mouth. Neck: No mass felt.  No neck rigidity. Respiratory: No rhonchi or crepitations. Cardiovascular: S1-S2 heard. Abdomen: Soft nontender bowel sounds present. Musculoskeletal: No edema. Skin: No rash. Neurologic: Alert awake oriented to time place and person.  Moves all extremities. Psychiatric: Appears normal.  Normal affect.   Labs on Admission: I have personally reviewed following labs and imaging studies  CBC: Recent Labs  Lab 01/27/20 0141  WBC 7.6  HGB 15.3*  HCT 47.4*  MCV 96.1  PLT 66*   Basic Metabolic Panel: Recent Labs  Lab 01/27/20 0141  NA 136  K 4.2  CL 102  CO2 26  GLUCOSE 103*  BUN 9  CREATININE 0.80  CALCIUM 9.0   GFR: CrCl cannot be calculated (Unknown ideal weight.). Liver Function Tests: Recent Labs  Lab 01/27/20 0141  AST 66*  ALT 93*  ALKPHOS 115  BILITOT 0.7  PROT 6.1*  ALBUMIN 3.8   Recent Labs  Lab 01/27/20 0141  LIPASE 25   No results for input(s): AMMONIA in the last 168 hours. Coagulation Profile: No results for input(s): INR, PROTIME in the last 168 hours. Cardiac Enzymes: No results for input(s): CKTOTAL, CKMB, CKMBINDEX, TROPONINI in the last 168 hours. BNP (last 3 results) No results for input(s): PROBNP in the last 8760 hours. HbA1C: No results for input(s): HGBA1C in the last 72 hours. CBG: No results for  input(s): GLUCAP in the last 168 hours. Lipid Profile: No  results for input(s): CHOL, HDL, LDLCALC, TRIG, CHOLHDL, LDLDIRECT in the last 72 hours. Thyroid Function Tests: No results for input(s): TSH, T4TOTAL, FREET4, T3FREE, THYROIDAB in the last 72 hours. Anemia Panel: No results for input(s): VITAMINB12, FOLATE, FERRITIN, TIBC, IRON, RETICCTPCT in the last 72 hours. Urine analysis:    Component Value Date/Time   COLORURINE YELLOW 12/20/2017 1410   APPEARANCEUR HAZY (A) 12/20/2017 1410   LABSPEC 1.014 12/20/2017 1410   PHURINE 6.0 12/20/2017 1410   GLUCOSEU NEGATIVE 12/20/2017 1410   HGBUR NEGATIVE 12/20/2017 1410   BILIRUBINUR NEGATIVE 12/20/2017 1410   KETONESUR NEGATIVE 12/20/2017 1410   PROTEINUR NEGATIVE 12/20/2017 1410   NITRITE NEGATIVE 12/20/2017 1410   LEUKOCYTESUR NEGATIVE 12/20/2017 1410   Sepsis Labs: @LABRCNTIP (procalcitonin:4,lacticidven:4) )No results found for this or any previous visit (from the past 240 hour(s)).   Radiological Exams on Admission: CT ABDOMEN PELVIS W CONTRAST  Result Date: 01/27/2020 CLINICAL DATA:  Abdomen distension with nausea and vomiting EXAM: CT ABDOMEN AND PELVIS WITH CONTRAST TECHNIQUE: Multidetector CT imaging of the abdomen and pelvis was performed using the standard protocol following bolus administration of intravenous contrast. CONTRAST:  126m OMNIPAQUE IOHEXOL 300 MG/ML  SOLN COMPARISON:  Ultrasound 08/02/2019, CT 07/14/2019, 02/25/2018 FINDINGS: Lower chest: Lung bases demonstrate no acute consolidation or pleural effusion. Cardiac size within normal limits. Hepatobiliary: Cirrhotic morphology of the liver. No focal hepatic abnormality. No biliary dilatation. Distended gallbladder with hazy edema at the gallbladder fossa. Generalized edema at the porta hepatis. Pancreas: Questionable mild peripancreatic inflammatory change about the proximal pancreas. Spleen: Markedly enlarged, measuring up to 20 cm AP. Adrenals/Urinary Tract: Adrenal  glands are unremarkable. Kidneys are normal, without renal calculi, focal lesion, or hydronephrosis. Bladder is unremarkable. Stomach/Bowel: Status post gastric sleeve surgery. No evidence for a bowel obstruction or bowel wall thickening. Negative appendix. Vascular/Lymphatic: Recanalized paraumbilical vein. Multiple distal esophageal varices. Tortuous collateral vessels at the splenic hilum. Multiple mildly enlarged porta hepatis lymph nodes measuring up to 10 mm. Gastrohepatic lymph node measuring 17 mm. Multiple right lower quadrant lymph nodes. Reproductive: Uterus unremarkable. Benign-appearing cyst in the left adnexa Other: No free air. Small amount of free fluid in the pelvis. Hazy appearance of the mesentery. Musculoskeletal: No acute or significant osseous findings. IMPRESSION: 1. Cirrhotic morphology of the liver with sequela of portal hypertension including splenomegaly, esophageal varices, and multiple upper abdominal collateral vessels. 2. Distended gallbladder with hazy edema at the gallbladder fossa, raising concern for acute cholecystitis. Suggest correlation with ultrasound. 3. Questionable mild peripancreatic inflammatory change about the proximal pancreas versus edema from inflammatory process at the gallbladder. Correlate with enzymes. 4. Small amount of free fluid in the pelvis. Electronically Signed   By: KDonavan FoilM.D.   On: 01/27/2020 03:49   UKoreaAbdomen Limited RUQ  Result Date: 01/27/2020 CLINICAL DATA:  Right upper quadrant abdominal pain.  Hematemesis. EXAM: ULTRASOUND ABDOMEN LIMITED RIGHT UPPER QUADRANT COMPARISON:  None. FINDINGS: Gallbladder: No gallstones or wall thickening visualized. No sonographic Murphy sign noted by sonographer. Maximal wall thickness is 3 mm, within normal limits. Common bile duct: Diameter: 3 mm, within normal limits Liver: No focal lesion identified. Within normal limits in parenchymal echogenicity. Portal vein is patent on color Doppler imaging with  normal direction of blood flow towards the liver. Other: None. IMPRESSION: Negative right upper quadrant ultrasound. Electronically Signed   By: CSan MorelleM.D.   On: 01/27/2020 05:09     Assessment/Plan Principal Problem:   Hematemesis Active Problems:   Persistent asthma with  undetermined severity   Adult hypothyroidism   Cirrhosis of liver (HCC)   Acute GI bleeding    1. Hematemesis/acute GI bleed in the setting of cirrhosis of liver with portal hypertension EGD done in March 2020 showed varices.  Patient presently hemodynamically stable.  We will check serial CBC.  Given history of varices patient has had octreotide started with SBP coverage with ceftriaxone.  Protonix infusion.  Consult [facility]. 2. History of hypothyroidism on Synthroid.  Presently n.p.o.  If patient continues to remain n.p.o. may have to change to IV levothyroxine. 3. History of bipolar disorder Lamictal presently n.p.o. 4. History of asthma and allergic rhinitis on inhalers. 5. 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  Covid test is pending.   DVT prophylaxis: SCDs. Code Status: Full code. Family Communication: Discussed with patient. Disposition Plan: Home. Consults called: None. Admission status: Observation.   Rise Patience MD [facility] Pager [phone].  If 7PM-7AM, please contact night-coverage [email or web address] Password [facility]  01/27/2020, 7:06 AM

## 2020-01-27 NOTE — Op Note (Signed)
[facility] Patient Name: Linda Becker Procedure Date : 01/27/2020 MRN: [id number] Attending MD: Justice Britain , MD Date of Birth: July 08, 1990 CSN: [id number] Age: 29 Admit Type: Inpatient Procedure:                Upper GI endoscopy Indications:              Coffee-ground emesis, Hematemesis, Esophageal                            varices, Follow-up of esophageal varices, Abnormal                            CT of the GI tract Providers:                Justice Britain, MD, Glori Bickers, RN, Doristine Johns, RN Referring MD:             Mauri Pole, MD, [facility], [name].                            Pyrtle, MD Medicines:                Monitored Anesthesia Care Complications:            No immediate complications. Estimated Blood Loss:     Estimated blood loss was minimal. Procedure:                Pre-Anesthesia Assessment:                           - Prior to the procedure, a History and Physical                            was performed, and patient medications and                            allergies were reviewed. The patient's tolerance of                            previous anesthesia was also reviewed. The risks                            and benefits of the procedure and the sedation                            options and risks were discussed with the patient.                            All questions were answered, and informed consent                            was obtained. Prior Anticoagulants: The patient has  taken no previous anticoagulant or antiplatelet                            agents. ASA Grade Assessment: III - A patient with                            severe systemic disease. After reviewing the risks                            and benefits, the patient was deemed in                            satisfactory condition to undergo the procedure.                           After  obtaining informed consent, the endoscope was                            passed under direct vision. Throughout the                            procedure, the patient's blood pressure, pulse, and                            oxygen saturations were monitored continuously. The                            [id number]-H190 ([id number]) Olympus gastroscope was                            introduced through the mouth, and advanced to the                            second part of duodenum. The upper GI endoscopy was                            accomplished without difficulty. The patient                            tolerated the procedure. Scope In: Scope Out: Findings:      No gross lesions were noted in the proximal esophagus and in the mid       esophagus.      Three columns of grade II, grade III varices with stigmata of recent       bleeding were found in the distal esophagus, 28 to 37 cm from the       incisors. Red wale signs were present. After the rest of the EGD was       completed, four bands were successfully placed with complete       eradication, resulting in deflation of varices. There was no bleeding       during and at the end of the procedure.      The Z-line was irregular and was found 37 cm from the incisors.      Mild portal hypertensive gastropathy was found in  the cardia and in the       gastric body.      Localized nodular mucosa was found in the gastric antrum - previously       noted in 2020 without significant change per images (previously biopsied       and negative for HP).      There is no endoscopic evidence of varices in the entire examined       stomach.      No gross lesions were noted in the duodenal bulb, in the first portion       of the duodenum and in the second portion of the duodenum. Impression:               - No gross lesions in esophagus proximally. Grade                            II and grade III esophageal varices with stigmata                            of  recent bleeding in distal esophagus. Completely                            eradicated. Banded.                           - Z-line irregular, 37 cm from the incisors.                           - Portal hypertensive gastropathy. Nodular mucosa                            in the gastric antrum. Previously biopsied and                            negative for HP.                           - No gross lesions in the duodenal bulb, in the                            first portion of the duodenum and in the second                            portion of the duodenum. Recommendation:           - The patient will be observed post-procedure,                            until all discharge criteria are met.                           - Return patient to hospital ward for ongoing care.                           - Full liquid diet today and advance to soft diet  starting tomorrow if does well overnight.                           - Continue IV PPI BID x 72 hours total and then                            transition to PO PPI BID until completion of EVBL                            surveillance. This will help decrease post-banding                            ulceration.                           - Continue Octreotide drip for at least 72 hours                            total.                           - Ceftriaxone x 5-days (CTX during Inpatient stay                            and then Ciprofloxacin or Cepfodoxime to complete                            after discharge).                           - Depending on how patient's blood pressures                            respond, consideration for Beta-Blockade to be                            initiated vs continued EVBL vs both for future.                            Will evaluate BP in hospital and decide in coming                            days role for starting.                           - Repeat EGD in 4-weeks for EVBL surveillance and                             possible retreatment with Dr. [name].                           - The findings and recommendations were discussed                            with the patient.                           -  The findings and recommendations were discussed                            with the patient's family.                           - The findings and recommendations were discussed                            with the referring physician. Procedure Code(s):        --- Professional ---                           [phone], Esophagogastroduodenoscopy, flexible,                            transoral; with band ligation of esophageal/gastric                            varices Diagnosis Code(s):        --- Professional ---                           I85.01, Esophageal varices with bleeding                           K22.8, Other specified diseases of esophagus                           K76.6, Portal hypertension                           K31.89, Other diseases of stomach and duodenum                           K92.0, Hematemesis                           R93.3, Abnormal findings on diagnostic imaging of                            other parts of digestive tract CPT copyright 2019 [employer]. All rights reserved. The codes documented in this report are preliminary and upon coder review may  be revised to meet current compliance requirements. Justice Britain, MD 01/27/2020 4:05:28 PM Number of Addenda: 0

## 2020-01-27 NOTE — ED Provider Notes (Signed)
Emergency Department Provider Note   I have reviewed the triage vital signs and the nursing notes.   HISTORY  Chief Complaint Hematemesis   HPI Linda Becker is a 29 y.o. 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  Patient developed a moderate amount of bright red hematemesis with some black, coffee-ground material mixed in.  Patient had multiple episodes of vomiting this evening with blood in the vomit twice.  She is not experiencing chest pain or shortness of breath.  She does have some right upper quadrant discomfort which is not unusual given her past history.  No fevers or chills.  No sick contacts.  No black or bright red blood in the bowel movements noticed by the patient.  No radiation of symptoms or other modifying factors.  Past Medical History:  Diagnosis Date  . Allergic rhinitis    remote hx of allergy shots  . Asthma    since childhood  on controller meds extrinsic dr Carmelina Peal  . Family history of adverse reaction to anesthesia   . Gallbladder sludge   . Gastropathy   . GERD (gastroesophageal reflux disease)    on nexium  for  Redith Drach term sx since childhood  . H/O miscarriage, not currently pregnant    [redacted] weeks  march 2016  . Hepatic steatosis   . Hypothyroidism   . Migraine   . Murmur    pt reports MVP  . Sessile colonic polyp   . Splenomegaly   . Steatohepatitis   . Syncope    under eval ? cause  . Tachycardia    episodes with near syncope eval dr Nadyne Coombes  on no meds dced LABA    Patient Active Problem List   Diagnosis Date Noted  . Hematemesis 01/27/2020  . Dehydration 07/22/2019  . Portal hypertension ([place])   . Chronic diarrhea   . Benign neoplasm of ascending colon   . Benign neoplasm of transverse colon   . Benign neoplasm of rectum   . Thrombocytopenia ([place]) 08/12/2018  . Spontaneous  vaginal delivery 12/22/2017  . IUFD at 39 weeks or more of gestation 12/20/2017  . Snoring 11/10/2017  . Gestational diabetes 10/12/2017  . Morbid obesity ([place]) 05/19/2017  . Adult hypothyroidism 05/19/2017  . BMI 40.0-44.9, adult ([place]) 11/09/2015  . GERD (gastroesophageal reflux disease)   . Tachycardia   . Syncope   . Allergic rhinitis   . Persistent asthma with undetermined severity   . Migraine with aura 09/26/2011    Past Surgical History:  Procedure Laterality Date  . BIOPSY  09/03/2018   Procedure: BIOPSY;  Surgeon: Jerene Bears, MD;  Location: [facility];  Service: Gastroenterology;;  . broken right femur  2008   rod placed  . COLONOSCOPY WITH PROPOFOL N/A 09/03/2018   Procedure: COLONOSCOPY WITH PROPOFOL;  Surgeon: Jerene Bears, MD;  Location: [facility];  Service: Gastroenterology;  Laterality: N/A;  . DILATION AND CURETTAGE OF UTERUS    . ESOPHAGOGASTRODUODENOSCOPY (EGD) WITH PROPOFOL N/A 09/03/2018   Procedure: ESOPHAGOGASTRODUODENOSCOPY (EGD) WITH PROPOFOL;  Surgeon: Jerene Bears, MD;  Location: [facility];  Service: Gastroenterology;  Laterality: N/A;  . IR TRANSCATHETER BX  01/19/2019  . IR US GUIDE VASC ACCESS RIGHT  01/19/2019  . IR VENOGRAM HEPATIC W HEMODYNAMIC EVALUATION  01/19/2019  . LAPAROSCOPIC GASTRIC SLEEVE RESECTION N/A 06/28/2019   Procedure: LAPAROSCOPIC GASTRIC SLEEVE  RESECTION, Upper Endo, ERAS Pathway;  Surgeon: Clovis Riley, MD;  Location: [facility];  Service: General;  Laterality: N/A;  . OB ultrasound N/A 12/01/2017   see report  . POLYPECTOMY  09/03/2018   Procedure: POLYPECTOMY;  Surgeon: Jerene Bears, MD;  Location: [facility];  Service: Gastroenterology;;  . Jacques Earthly  . WISDOM TOOTH EXTRACTION      Allergies Progesterone and Penicillins  Family History  Problem Relation Age of Onset  . Asthma Father   . Arthritis Father   . Hyperlipidemia Father   . Heart disease Father   . Hypertension Father   . Diabetes Father    . Colon polyps Father   . Arthritis Mother   . Hyperlipidemia Mother   . Heart disease Mother   . Hypertension Mother   . Diabetes Mother   . Kidney disease Mother   . Liver disease Mother   . Bleeding Disorder Mother   . Colon polyps Mother   . Arthritis Maternal Grandmother   . Breast cancer Maternal Grandmother   . Thyroid disease Maternal Grandmother   . Colon cancer Paternal Grandfather   . Diabetes Paternal Grandfather   . Liver cancer Paternal Grandfather   . Stomach cancer Paternal Grandfather   . Breast cancer Paternal Grandmother   . Diabetes Paternal Grandmother   . Breast cancer Paternal Aunt   . Esophageal cancer Paternal Aunt   . Breast cancer Maternal Aunt   . Diabetes Maternal Grandfather   . Pancreatic cancer Maternal Grandfather     Social History Social History   Tobacco Use  . Smoking status: Never Smoker  . Smokeless tobacco: Never Used  Vaping Use  . Vaping Use: Never used  Substance Use Topics  . Alcohol use: No    Alcohol/week: 0.0 standard drinks  . Drug use: No    Review of Systems  Constitutional: No fever/chills. Eyes: No visual changes. ENT: No sore throat. Cardiovascular: Denies chest pain. Respiratory: Denies shortness of breath. Gastrointestinal: Epigastric and RUQ abdominal pain.  Positive nausea and hematemesis.  No diarrhea.  No constipation. Genitourinary: Negative for dysuria. Musculoskeletal: Negative for back pain. Skin: Negative for rash. Neurological: Negative for headaches, focal weakness or numbness.  10-point ROS otherwise negative.  ____________________________________________   PHYSICAL EXAM:  VITAL SIGNS: ED Triage Vitals  Enc Vitals Group     BP 01/27/20 0132 (!) 139/110     Pulse Rate 01/27/20 0132 97     Resp 01/27/20 0132 16     Temp 01/27/20 0132 98.6 F (37 C)     Temp Source 01/27/20 0132 Oral     SpO2 01/27/20 0132 98 %   Constitutional: Alert and oriented. Well appearing and in no acute  distress. Eyes: Conjunctivae are normal. Head: Atraumatic. Nose: No congestion/rhinnorhea. Mouth/Throat: Mucous membranes are moist.  Neck: No stridor.   Cardiovascular: Normal rate, regular rhythm. Good peripheral circulation. Grossly normal heart sounds.   Respiratory: Normal respiratory effort.  No retractions. Lungs CTAB. Gastrointestinal: Soft with mild RUQ tenderness. No rebound or guarding. No distention.  Musculoskeletal: No gross deformities of extremities. Neurologic:  Normal speech and language.  Skin:  Skin is warm, dry and intact. No rash noted.   ____________________________________________   LABS (all labs ordered are listed, but only abnormal results are displayed)  Labs Reviewed  COMPREHENSIVE METABOLIC PANEL - Abnormal; Notable for the following components:      Result Value   Glucose, Bld 103 (*)    Total Protein  6.1 (*)    AST 66 (*)    ALT 93 (*)    All other components within normal limits  CBC - Abnormal; Notable for the following components:   Hemoglobin 15.3 (*)    HCT 47.4 (*)    Platelets 66 (*)    All other components within normal limits  SARS CORONAVIRUS 2 BY RT PCR (HOSPITAL ORDER, [place] [facility])  LIPASE, BLOOD  URINALYSIS, ROUTINE W REFLEX MICROSCOPIC  I-STAT BETA HCG BLOOD, ED ([facility], [facility], [facility] ONLY)  TYPE AND SCREEN   ____________________________________________  RADIOLOGY  CT ABDOMEN PELVIS W CONTRAST  Result Date: 01/27/2020 CLINICAL DATA:  Abdomen distension with nausea and vomiting EXAM: CT ABDOMEN AND PELVIS WITH CONTRAST TECHNIQUE: Multidetector CT imaging of the abdomen and pelvis was performed using the standard protocol following bolus administration of intravenous contrast. CONTRAST:  1100m OMNIPAQUE IOHEXOL 300 MG/ML  SOLN COMPARISON:  Ultrasound 08/02/2019, CT 07/14/2019, 02/25/2018 FINDINGS: Lower chest: Lung bases demonstrate no acute consolidation or pleural effusion. Cardiac size within normal limits.  Hepatobiliary: Cirrhotic morphology of the liver. No focal hepatic abnormality. No biliary dilatation. Distended gallbladder with hazy edema at the gallbladder fossa. Generalized edema at the porta hepatis. Pancreas: Questionable mild peripancreatic inflammatory change about the proximal pancreas. Spleen: Markedly enlarged, measuring up to 20 cm AP. Adrenals/Urinary Tract: Adrenal glands are unremarkable. Kidneys are normal, without renal calculi, focal lesion, or hydronephrosis. Bladder is unremarkable. Stomach/Bowel: Status post gastric sleeve surgery. No evidence for a bowel obstruction or bowel wall thickening. Negative appendix. Vascular/Lymphatic: Recanalized paraumbilical vein. Multiple distal esophageal varices. Tortuous collateral vessels at the splenic hilum. Multiple mildly enlarged porta hepatis lymph nodes measuring up to 10 mm. Gastrohepatic lymph node measuring 17 mm. Multiple right lower quadrant lymph nodes. Reproductive: Uterus unremarkable. Benign-appearing cyst in the left adnexa Other: No free air. Small amount of free fluid in the pelvis. Hazy appearance of the mesentery. Musculoskeletal: No acute or significant osseous findings. IMPRESSION: 1. Cirrhotic morphology of the liver with sequela of portal hypertension including splenomegaly, esophageal varices, and multiple upper abdominal collateral vessels. 2. Distended gallbladder with hazy edema at the gallbladder fossa, raising concern for acute cholecystitis. Suggest correlation with ultrasound. 3. Questionable mild peripancreatic inflammatory change about the proximal pancreas versus edema from inflammatory process at the gallbladder. Correlate with enzymes. 4. Small amount of free fluid in the pelvis. Electronically Signed   By: KDonavan FoilM.D.   On: 01/27/2020 03:49   [place]domen Limited RUQ  Result Date: 01/27/2020 CLINICAL DATA:  Right upper quadrant abdominal pain.  Hematemesis. EXAM: ULTRASOUND ABDOMEN LIMITED RIGHT UPPER QUADRANT  COMPARISON:  None. FINDINGS: Gallbladder: No gallstones or wall thickening visualized. No sonographic Murphy sign noted by sonographer. Maximal wall thickness is 3 mm, within normal limits. Common bile duct: Diameter: 3 mm, within normal limits Liver: No focal lesion identified. Within normal limits in parenchymal echogenicity. Portal vein is patent on color Doppler imaging with normal direction of blood flow towards the liver. Other: None. IMPRESSION: Negative right upper quadrant ultrasound. Electronically Signed   By: CSan MorelleM.D.   On: 01/27/2020 05:09    ____________________________________________   PROCEDURES  Procedure(s) performed:   Procedures  CRITICAL CARE Performed by: JMargette FastTotal critical care time: 35 minutes Critical care time was exclusive of separately billable procedures and treating other patients. Critical care was necessary to treat or prevent imminent or life-threatening deterioration. Critical care was time spent personally by me on the following  activities: development of treatment plan with patient and/or surrogate as well as nursing, discussions with consultants, evaluation of patient's response to treatment, examination of patient, obtaining history from patient or surrogate, ordering and performing treatments and interventions, ordering and review of laboratory studies, ordering and review of radiographic studies, pulse oximetry and re-evaluation of patient's condition.  Nanda Quinton, MD Emergency Medicine  ____________________________________________   INITIAL IMPRESSION / ASSESSMENT AND PLAN / ED COURSE  Pertinent labs & imaging results that were available during my care of the patient were reviewed by me and considered in my medical decision making (see chart for details).   Patient with history of Karlene Lineman and known esophageal varices presents to the emergency department with hematemesis twice this evening.  She is hemodynamically stable  on arrival to the ED. the patient has pictures of her hematemesis at home which seems moderate to large volume there.  No additional vomiting here.  Patient is status post gastric bypass from earlier this year.  In that setting I do plan on CT abdomen pelvis along with starting octreotide and Protonix with concern for possible variceal bleed.  Lower suspicion clinically for esophageal perforation/rupture or complicated peptic ulcer disease.   Hb reviewed along with labs. Mild elevation in LFTs with normal bilirubin. CT with possible gallbladder inflammation. RUQ [place] follow up is unremarkable. Plan to continue octreotide and protonix infusions and admit for obs and GI consultation in the AM.   Discussed patient's case with Hal Hope to request admission. Patient and family (if present) updated with plan. Care transferred to [facility].  I reviewed all nursing notes, vitals, pertinent old records, EKGs, labs, imaging (as available).  ____________________________________________  FINAL CLINICAL IMPRESSION(S) / ED DIAGNOSES  Final diagnoses:  RUQ abdominal pain  Hematemesis with nausea     MEDICATIONS GIVEN DURING THIS VISIT:  Medications  octreotide (SANDOSTATIN) 2 mcg/mL load via infusion 100 mcg (100 mcg Intravenous Bolus from Bag 01/27/20 0345)    And  octreotide (SANDOSTATIN) 500 mcg in sodium chloride 0.9 % 250 mL (2 mcg/mL) infusion (50 mcg/hr Intravenous New Bag/Given 01/27/20 0346)  pantoprazole (PROTONIX) 80 mg in sodium chloride 0.9 % 100 mL (0.8 mg/mL) infusion (8 mg/hr Intravenous New Bag/Given 01/27/20 0404)  pantoprazole (PROTONIX) 80 mg in sodium chloride 0.9 % 100 mL IVPB (0 mg Intravenous Stopped 01/27/20 0404)  metoCLOPramide (REGLAN) injection 10 mg (10 mg Intravenous Given 01/27/20 0305)  sodium chloride 0.9 % bolus 500 mL (500 mLs Intravenous New Bag/Given 01/27/20 0305)  iohexol (OMNIPAQUE) 300 MG/ML solution 100 mL (100 mLs Intravenous Contrast Given 01/27/20 0326)   cefTRIAXone (ROCEPHIN) 1 g in sodium chloride 0.9 % 100 mL IVPB (1 g Intravenous New Bag/Given 01/27/20 8185)    Note:  This document was prepared using Dragon voice recognition software and may include unintentional dictation errors.  Nanda Quinton, MD, [facility] Emergency Medicine    Ortha Metts, [facility], MD 01/27/20 [phone]

## 2020-01-27 NOTE — ED Triage Notes (Signed)
Pt has hx of esophageal varices and today pt started vomiting and about 8 pm started noticing blood and blood clots. Pt also had a gastric bypass in January. Pt says no abdominal pain

## 2020-01-27 NOTE — H&P (View-Only) (Signed)
[facility] Consult: 10:34 AM 01/27/2020  LOS: 0 days    Referring Provider: Dr Starla Link Primary Care Physician:  Burnis Medin, MD Primary Gastroenterologist:  Roosevelt Locks NP, Zenovia Jarred MD   Attending physician's note   I have taken a history, examined the patient and reviewed the chart. I agree with the Advanced Practitioner's note, impression and recommendations.  29 yr F with obesity, NASH cirrhosis s/p sleeve gastrectomy, portal HTN, small esophageal varices EGD 2020 admitted after episodes of projectile vomiting and Hematemesis X2 Hemodynamically stable No significant drop in Hgb  Possible Mallory weis tear but will need exclude variceal hemorrhage, anastomotic ulcer or peptic ulcer We will plan to proceed with EGD with Dr. Rush Landmark this afternoon N.p.o.  Continue PPI and octreotide drip No significant ascites, if no further bleeding can discontinue empiric ceftriaxone for SBP prophylaxis  Elevated transaminases secondary to [facility] cirrhosis Distended gallbladder and edema of gallbladder wall likely secondary to hypoalbuminemia/portal hypertension in the setting of cirrhosis.  Patient has no leukocytosis or abdominal pain.  Less likely to be acute cholecystitis Continue to monitor LFT  The risks and benefits as well as alternatives of endoscopic procedure(s) have been discussed and reviewed. All questions answered. The patient agrees to proceed.   Damaris Hippo , MD [phone]    Reason for Consultation:  [place] hematemesis.   HPI: Linda Becker is a 29 y.o. female.  PMH Morbid obesity, 06/2019 bariatric laparoscopic gastric sleeve procedure. Cholestasis of pregnancy leading to fetal demise.  NASH.  Cirrhosis as of CT 06/2019.  Portal hypertension.  Esophageal varices.   Splenomegaly.  Thrombocytopenia.  IBS-D.  GERD.  Asthma.  Bipolar disorder  02/2018 liver biopsy 09/03/2018 EGD: For variceal screening, diarrhea, abdominal pain.  Small, nonbleeding, grade 1, distal esophageal varices.  Small HH.  Erythema, nodularity in the gastric antrum.  Path: no celiac dz, + reactive gastropathy. 09/03/2018 colonoscopy for diarrhea, abdominal pain, anemia. Five, 2 to 5 mm, polyps removed.  Mucosa grossly normal, biopsies obtained.  Path: SSA and HP polyps, no Microscopic or lymphocytic colitis.  Since the gastric sleeve procedure patient has had frequent episodes of nausea and vomiting of mostly bilious material.  She is on Protonix and she uses oral Phenergan prn.  Yesterday she threw up nonbloody material earlier in the day.  In the evening she vomited twice, both episodes produced a moderate to large amount of burgundy blood.  She did not feel dizzy, short of breath, diaphoretic.  No abdominal pain.  BMs were brown earlier yesterday and none since then.  Not currently nauseated or vomiting but does have a worse than usual headache.  Patient has occasional breakthrough acid reflux which has been more prominent in the last couple of days despite PPI. Since her bariatric surgery she has had about a 50 pound weight loss.  Presented to the ED round 2:15 AM.  BP 139/110.  Heart rate 97.  No fever.  Room air sats high 90s to 100%. Hgb 13.5.  MCV 94.  Platelets 52 (wer 64 on 7/31).  WBCs normal. INR 1.1.  BUN/Creat normal.   Mild transaminitis 66/93, normal bili, alk phos, albumin, Lipase. Covid 19 negative.   CTAP w contrast: Cirrhosis.  Portal htn, splenomegaly, esoph varices, collateral vessels upper abdomen.  Hazy edema GB fossa and distended GB, concern for cholecystitis.  ? Peripancreatic inflammation.  Small pelvic FF.    Pt started on Rocephin, octreotide and Protonix drip. She is mentating well.  Family Hx  Mother with advanced nonalcoholic cirrhosis, followed by Dr.  Havery Moros.  Liver cancer, stomach cancer in her paternal grandfather, pancreatic cancer in maternal grandmother.  Esophageal cancer in paternal aunt.  Colon polyps in father  Social Hx Married, lives with her husband.  No children.  No EtOH, no illicit drugs.  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   Past Medical History:  Diagnosis Date  . Allergic rhinitis    remote hx of allergy shots  . Asthma    since childhood  on controller meds extrinsic dr Carmelina Peal  . Family history of adverse reaction to anesthesia   . Gallbladder sludge   . Gastropathy   . GERD (gastroesophageal reflux disease)    on nexium  for  long term sx since childhood  . H/O miscarriage, not currently pregnant    [redacted] weeks  march 2016  . Hepatic steatosis   . Hypothyroidism   . Migraine   . Murmur    pt reports MVP  . Sessile colonic polyp   . Splenomegaly   . Steatohepatitis   . Syncope    under eval ? cause  . Tachycardia    episodes with near syncope eval dr Nadyne Coombes  on no meds dced LABA    Past Surgical History:  Procedure Laterality Date  . BIOPSY  09/03/2018   Procedure: BIOPSY;  Surgeon: Jerene Bears, MD;  Location: [facility];  Service: Gastroenterology;;  . broken right femur  2008   rod placed  . COLONOSCOPY WITH PROPOFOL N/A 09/03/2018   Procedure: COLONOSCOPY WITH PROPOFOL;  Surgeon: Jerene Bears, MD;  Location: [facility];  Service: Gastroenterology;  Laterality: N/A;  . DILATION AND CURETTAGE OF UTERUS    . ESOPHAGOGASTRODUODENOSCOPY (EGD) WITH PROPOFOL N/A 09/03/2018   Procedure: ESOPHAGOGASTRODUODENOSCOPY (EGD) WITH PROPOFOL;  Surgeon: Jerene Bears, MD;  Location: [facility];  Service: Gastroenterology;  Laterality: N/A;  . IR TRANSCATHETER BX  01/19/2019  . IR US GUIDE VASC ACCESS RIGHT  01/19/2019  . IR VENOGRAM HEPATIC W HEMODYNAMIC EVALUATION  01/19/2019  . LAPAROSCOPIC GASTRIC SLEEVE RESECTION N/A 06/28/2019   Procedure: LAPAROSCOPIC GASTRIC SLEEVE RESECTION, Upper Endo,  ERAS Pathway;  Surgeon: Clovis Riley, MD;  Location: [facility];  Service: General;  Laterality: N/A;  . OB ultrasound N/A 12/01/2017   see report  . POLYPECTOMY  09/03/2018   Procedure: POLYPECTOMY;  Surgeon: Jerene Bears, MD;  Location: [facility];  Service: Gastroenterology;;  . Jacques Earthly  . WISDOM TOOTH EXTRACTION      Prior to Admission medications   Medication Sig Start Date End Date Taking? Authorizing Provider  albuterol (PROVENTIL) (2.5 MG/3ML) 0.083% nebulizer solution Use 1 vial nebulized every 4-6 hours as needed for cough, wheeze, shortness of breath or chest tightness Patient taking differently: Take 2.5 mg by nebulization every 4 (four) hours as needed for wheezing or shortness of breath.  08/09/18  Yes Kozlow, Donnamarie Poag, MD  albuterol (VENTOLIN HFA) 108 (90 Base) MCG/ACT inhaler Can inhale two puffs every four to  six hours as needed for cough or wheeze. Patient taking differently: Inhale 2 puffs into the lungs every 6 (six) hours as needed for wheezing or shortness of breath. Can inhale two puffs every four to six hours as needed for cough or wheeze. 09/29/19  Yes Kozlow, Eric J, MD  Ascorbic Acid (VITAMIN C WITH ROSE HIPS) 500 MG tablet Take 500 mg by mouth 2 (two) times daily.   Yes [provider]  Beclomethasone Dipropionate (QNASL) 80 MCG/ACT AERS INSTILL 1 SPRAY IN EACH NOSTRIL ONCE DAILY AS DIRECTED Patient taking differently: Place 1 spray into the nose daily. INSTILL 1 SPRAY IN EACH NOSTRIL ONCE DAILY AS DIRECTED 09/29/19  Yes Kozlow, Eric J, MD  calcium carbonate (OSCAL) 1500 (600 Ca) MG TABS tablet Take 600 mg of elemental calcium by mouth 2 (two) times daily with a meal.   Yes [provider]  cetirizine (ZYRTEC) 5 MG tablet Take 5 mg by mouth 2 (two) times daily.   Yes [provider]  dupilumab (DUPIXENT) 300 MG/2ML prefilled syringe Inject 300 mg into the skin every 14 (fourteen) days. 10/06/19  Yes Jegede, Olugbemiga E, MD   lamoTRIgine (LAMICTAL) 150 MG tablet Take 150 mg by mouth daily.   Yes [provider]  levothyroxine (SYNTHROID) 88 MCG tablet TAKE 1 TABLET BY MOUTH DAILY. Patient taking differently: Take 88 mcg by mouth daily before breakfast.  01/29/19  Yes Kumar, Ajay, MD  montelukast (SINGULAIR) 10 MG tablet Take 1 tablet (10 mg total) by mouth daily. 09/29/19  Yes Kozlow, Eric J, MD  Olopatadine HCl 0.2 % SOLN Can use one drop in each eye once daily if needed. Patient taking differently: Place 1 drop into both eyes daily as needed (allergies). Can use one drop in each eye once daily if needed. 09/29/19  Yes Kozlow, Eric J, MD  pantoprazole (PROTONIX) 40 MG tablet Take 1 tablet (40 mg total) by mouth daily. 06/29/19  Yes Connor, Chelsea A, MD  Prenatal Vit-Fe Fumarate-FA (MULTIVITAMIN-PRENATAL) 27-0.8 MG TABS tablet Take 1 tablet by mouth 2 (two) times daily.   Yes [provider]  SYMBICORT 160-4.5 MCG/ACT inhaler INHALE 2 PUFFS BY MOUTH TWICE A DAY TO PREVENT COUGH OR WHEEZE. RINSE, GARGLE, AND SPIT AFTER USE. 09/29/19  Yes Kozlow, Eric J, MD  vitamin E 400 UNIT capsule Take 400 Units by mouth 2 (two) times daily.   Yes [provider]  benzonatate (TESSALON) 100 MG capsule Take 1 capsule (100 mg total) by mouth every 8 (eight) hours. Patient not taking: Reported on 01/27/2020 01/15/20   Hall-Potvin, Graciemae, PA-C  doxycycline (VIBRA-TABS) 100 MG tablet Take 1 tablet (100 mg total) by mouth 2 (two) times daily. Patient not taking: Reported on 01/27/2020 01/09/20   Hawks, Christy A, FNP  mupirocin cream (BACTROBAN) 2 % Apply 1 application topically 2 (two) times daily. Patient not taking: Reported on 01/27/2020 01/14/20   Hall-Potvin, Etola, PA-C  predniSONE (DELTASONE) 10 MG tablet Take one tablet by mouth once daily for ten days only. Patient not taking: Reported on 01/27/2020 01/16/20   Kozlow, Eric J, MD    Scheduled Meds: . fluticasone  2 spray Each Nare Daily  .  mometasone-formoterol  2 puff Inhalation BID  . olopatadine  1 drop Both Eyes BID   Infusions: . [START ON 01/28/2020] cefTRIAXone (ROCEPHIN)  IV    . octreotide  (SANDOSTATIN)    IV infusion 50 mcg/hr (01/27/20 0346)  . pantoprozole (PROTONIX) infusion 8 mg/hr (01/27/20 0404)     PRN Meds: acetaminophen, albuterol, ondansetron **OR** ondansetron (ZOFRAN) IV   Allergies as of 01/27/2020 - Review Complete 01/27/2020  Allergen Reaction Noted  . Progesterone Rash 05/19/2017  . Penicillins Rash 02/10/2018    Family History  Problem Relation Age of Onset  . Asthma Father   . Arthritis Father   . Hyperlipidemia Father   . Heart disease Father   . Hypertension Father   . Diabetes Father   . Colon polyps Father   . Arthritis Mother   . Hyperlipidemia Mother   . Heart disease Mother   . Hypertension Mother   . Diabetes Mother   . Kidney disease Mother   . Liver disease Mother   . Bleeding Disorder Mother   . Colon polyps Mother   . Arthritis Maternal Grandmother   . Breast cancer Maternal Grandmother   . Thyroid disease Maternal Grandmother   . Colon cancer Paternal Grandfather   . Diabetes Paternal Grandfather   . Liver cancer Paternal Grandfather   . Stomach cancer Paternal Grandfather   . Breast cancer Paternal Grandmother   . Diabetes Paternal Grandmother   . Breast cancer Paternal Aunt   . Esophageal cancer Paternal Aunt   . Breast cancer Maternal Aunt   . Diabetes Maternal Grandfather   . Pancreatic cancer Maternal Grandfather     Social History   Socioeconomic History  . Marital status: Single    Spouse name: Not on file  . Number of children: 1  . Years of education: Not on file  . Highest education level: Not on file  Occupational History  . Not on file  Tobacco Use  . Smoking status: Never Smoker  . Smokeless tobacco: Never Used  Vaping Use  . Vaping Use: Never used  Substance and Sexual Activity  . Alcohol use: No    Alcohol/week: 0.0 standard  drinks  . Drug use: No  . Sexual activity: Yes    Birth control/protection: None  Other Topics Concern  . 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 Difficulty of Paying Living Expenses:   Food Insecurity:   . Worried About Charity fundraiser in the Last Year:   . Arboriculturist in the Last Year:   Transportation Needs:   . Film/video editor (Medical):   Marland Kitchen Lack of Transportation (Non-Medical):   Physical Activity:   . Days of Exercise per Week:   . Minutes of Exercise per Session:   Stress:   . Feeling of Stress :   Social Connections:   . Frequency of Communication with Friends and Family:   . Frequency of Social Gatherings with Friends and Family:   . Attends Religious Services:   . Active Member of Clubs or Organizations:   . Attends Archivist Meetings:   Marland Kitchen Marital Status:   Intimate Partner Violence:   . Fear of Current or Ex-Partner:   . Emotionally Abused:   Marland Kitchen Physically Abused:   . Sexually Abused:     REVIEW OF SYSTEMS: Constitutional: Per HPI.  Some weakness but nothing profound.  ENT:  No nose bleeds Pulm: The last few days a bit more short of breath and mostly nonproductive cough. CV:  No palpitations, no LE edema.  No chest pain GU:  No hematuria, no frequency GI: See HPI Heme: Other than the hematemesis, no unusual bleeding or bruising Transfusions: None Neuro: Headaches as per HPI.  No peripheral tingling or numbness Derm:  No itching, no rash.  Few weeks ago got bitten by a spider, suspicious that it was a brown recluse, on her right forearm, this is healing nicely but scab remains. Endocrine:  No sweats or chills.  No polyuria or  dysuria Immunization: Has not gotten COVID-19 vaccination due to concern about vaccine interaction with her thrombocytopenia. Travel:  None beyond local counties in last few months.    PHYSICAL EXAM: Vital signs in last 24 hours: Vitals:   01/27/20 0132 01/27/20 0255  BP: (!) 139/110 110/90  Pulse: 97 85  Resp: 16 16  Temp: 98.6 F (37 C) 98.6 F (37 C)  SpO2: 98% 100%   Wt Readings from Last 3 Encounters:  01/27/20 124.7 kg  09/29/19 121.6 kg  08/23/19 119.6 kg    General: Pleasant, well-appearing, fully alert, obese, comfortable Head: No facial asymmetry or swelling.  No signs of head trauma. Eyes: No conjunctival pallor or scleral icterus.  EOMI. Ears: No hearing deficit Nose: No discharge or congestion Mouth: Moist, pink, clear oral mucosa.  Good dentition.  Tongue midline. Neck: No JVD, no masses, no thyromegaly Lungs: Clear bilaterally without labored breathing or cough Heart: RRR.  No MRG.  S1, S2 present Abdomen: Obese without tenderness.  No masses.  Do not appreciate HSM, bruits or hernias..   Rectal: Deferred Musc/Skeltl: No joint redness, swelling or gross deformity. Extremities: No CCE. Neurologic: No asterixis, no tremors, no weakness.  Moves all 4 limbs.  Fully alert and oriented.  Excellent historian. Skin: No telangiectasia, no rashes.  Scabbed over round lesion on R forearm Tattoos:  Several, professional grade Nodes: No cervical or inguinal adenopathy Psych: Pleasant, cooperative, in good spirits.    Intake/Output from previous day: No intake/output data recorded. Intake/Output this shift: No intake/output data recorded.  LAB RESULTS: Recent Labs    01/27/20 0141 01/27/20 0901  WBC 7.6 4.8  HGB 15.3* 13.5  HCT 47.4* 41.3  PLT 66* 52*   BMET Lab Results  Component Value Date   NA 136 01/27/2020   NA 145 01/14/2020   NA 145 01/14/2020   K 4.2 01/27/2020   K 4.3 01/14/2020   K 4.3 01/14/2020   CL 102 01/27/2020   CL 114 (H) 01/14/2020    CL 114 (H) 01/14/2020   CO2 26 01/27/2020   CO2 21 (L) 01/14/2020   CO2 21 (L) 01/14/2020   GLUCOSE 103 (H) 01/27/2020   GLUCOSE 95 01/14/2020   GLUCOSE 95 01/14/2020   BUN 9 01/27/2020   BUN 12 01/14/2020   BUN 12 01/14/2020   CREATININE 0.80 01/27/2020   CREATININE 0.81 01/14/2020   CREATININE 0.81 01/14/2020   CALCIUM 9.0 01/27/2020   CALCIUM 8.8 (L) 01/14/2020   CALCIUM 8.8 (L) 01/14/2020   LFT Recent Labs    01/27/20 0141  PROT 6.1*  ALBUMIN 3.8  AST 66*  ALT 93*  ALKPHOS 115  BILITOT 0.7   PT/INR Lab Results  Component Value Date   INR 1.1 01/19/2019   INR 1.1 (H) 11/26/2018   INR 1.2 (H) 04/01/2018   Hepatitis Panel No results for input(s): HEPBSAG,  HCVAB, [place], HEPBIGM in the last 72 hours. C-Diff No components found for: CDIFF Lipase     Component Value Date/Time   LIPASE 25 01/27/2020 0141    Drugs of Abuse  No results found for: LABOPIA, COCAINSCRNUR, LABBENZ, AMPHETMU, THCU, LABBARB   RADIOLOGY STUDIES: CT ABDOMEN PELVIS W CONTRAST  Result Date: 01/27/2020 CLINICAL DATA:  Abdomen distension with nausea and vomiting EXAM: CT ABDOMEN AND PELVIS WITH CONTRAST TECHNIQUE: Multidetector CT imaging of the abdomen and pelvis was performed using the standard protocol following bolus administration of intravenous contrast. CONTRAST:  142m OMNIPAQUE IOHEXOL 300 MG/ML  SOLN COMPARISON:  Ultrasound 08/02/2019, CT 07/14/2019, 02/25/2018 FINDINGS: Lower chest: Lung bases demonstrate no acute consolidation or pleural effusion. Cardiac size within normal limits. Hepatobiliary: Cirrhotic morphology of the liver. No focal hepatic abnormality. No biliary dilatation. Distended gallbladder with hazy edema at the gallbladder fossa. Generalized edema at the porta hepatis. Pancreas: Questionable mild peripancreatic inflammatory change about the proximal pancreas. Spleen: Markedly enlarged, measuring up to 20 cm AP. Adrenals/Urinary Tract: Adrenal glands are unremarkable.  Kidneys are normal, without renal calculi, focal lesion, or hydronephrosis. Bladder is unremarkable. Stomach/Bowel: Status post gastric sleeve surgery. No evidence for a bowel obstruction or bowel wall thickening. Negative appendix. Vascular/Lymphatic: Recanalized paraumbilical vein. Multiple distal esophageal varices. Tortuous collateral vessels at the splenic hilum. Multiple mildly enlarged porta hepatis lymph nodes measuring up to 10 mm. Gastrohepatic lymph node measuring 17 mm. Multiple right lower quadrant lymph nodes. Reproductive: Uterus unremarkable. Benign-appearing cyst in the left adnexa Other: No free air. Small amount of free fluid in the pelvis. Hazy appearance of the mesentery. Musculoskeletal: No acute or significant osseous findings. IMPRESSION: 1. Cirrhotic morphology of the liver with sequela of portal hypertension including splenomegaly, esophageal varices, and multiple upper abdominal collateral vessels. 2. Distended gallbladder with hazy edema at the gallbladder fossa, raising concern for acute cholecystitis. Suggest correlation with ultrasound. 3. Questionable mild peripancreatic inflammatory change about the proximal pancreas versus edema from inflammatory process at the gallbladder. Correlate with enzymes. 4. Small amount of free fluid in the pelvis. Electronically Signed   By: KDonavan FoilM.D.   On: 01/27/2020 03:49   UKoreaAbdomen Limited RUQ  Result Date: 01/27/2020 CLINICAL DATA:  Right upper quadrant abdominal pain.  Hematemesis. EXAM: ULTRASOUND ABDOMEN LIMITED RIGHT UPPER QUADRANT COMPARISON:  None. FINDINGS: Gallbladder: No gallstones or wall thickening visualized. No sonographic Murphy sign noted by sonographer. Maximal wall thickness is 3 mm, within normal limits. Common bile duct: Diameter: 3 mm, within normal limits Liver: No focal lesion identified. Within normal limits in parenchymal echogenicity. Portal vein is patent on color Doppler imaging with normal direction of blood  flow towards the liver. Other: None. IMPRESSION: Negative right upper quadrant ultrasound. Electronically Signed   By: CSan MorelleM.D.   On: 01/27/2020 05:09     IMPRESSION:   *   Hematemesis.  No anemia.  *   Cirrhosis due to obesity related NASH.  Sequela include esoph varices, thrombocytopenia, splenomegaly.  coags are WNL.     CTAP w ? Acute cholecystitis, ? Pancreatitis.   RUQ ultrasound unremarkable, no GB or liver disease.   Mild transaminitis and normal lipase.   Patient has no abdominal pain or symptomatology to suggest cholecystitis or pancreatitis.  *   Thrombocytopenia, acute on chronic.  Normal INR.  *    Headache   PLAN:     *   Needs EGD.  This is likely to happen this afternoon.  *  1 dose of oxycodone now for headache Continue Protonix drip, octreotide drip, Rocephin, n.p.o.  *    CBC q. 5 AM, 5 PM   Azucena Freed  01/27/2020, 10:34 AM Phone [phone]

## 2020-01-27 NOTE — Transfer of Care (Signed)
Immediate Anesthesia Transfer of Care Note  Patient: [name]  Procedure(s) Performed: ESOPHAGOGASTRODUODENOSCOPY (EGD) WITH PROPOFOL (N/A ) GASTRIC VARICES BANDING  Patient Location: PACU  Anesthesia Type:MAC  Level of Consciousness: awake  Airway & Oxygen Therapy: Patient Spontanous Breathing  Post-op Assessment: Report given to RN and Post -op Vital signs reviewed and stable  Post vital signs: Reviewed and stable  Last Vitals:  Vitals Value Taken Time  BP 92/61 01/27/20 1547  Temp 36.5 C 01/27/20 1547  Pulse 77 01/27/20 1553  Resp 23 01/27/20 1553  SpO2 100 % 01/27/20 1553  Vitals shown include unvalidated device data.  Last Pain:  Vitals:   01/27/20 1547  TempSrc: Temporal  PainSc: 0-No pain      Patients Stated Pain Goal: 0 (28/41/32 4401)  Complications: No complications documented.

## 2020-01-27 NOTE — ED Notes (Signed)
RN attempted to call report, but Network engineer states they just got in huddle, RN not available at this time

## 2020-01-27 NOTE — Anesthesia Preprocedure Evaluation (Addendum)
Anesthesia Evaluation  Patient identified by MRN, date of birth, ID band Patient awake    Reviewed: Allergy & Precautions, Patient's Chart, lab work & pertinent test results  History of Anesthesia Complications Negative for: history of anesthetic complications  Airway Mallampati: III  TM Distance: >3 FB Neck ROM: Full    Dental  (+) Dental Advisory Given   Pulmonary asthma ,    breath sounds clear to auscultation       Cardiovascular negative cardio ROS   Rhythm:Regular     Neuro/Psych negative neurological ROS  negative psych ROS   GI/Hepatic GERD  ,(+) Cirrhosis   Esophageal Varices    , Hepatitis - (NASH)  Endo/Other  Hypothyroidism Morbid obesity  Renal/GU negative Renal ROS  negative genitourinary   Musculoskeletal negative musculoskeletal ROS (+)   Abdominal   Peds  Hematology negative hematology ROS (+)   Anesthesia Other Findings   Reproductive/Obstetrics                            Anesthesia Physical Anesthesia Plan  ASA: III  Anesthesia Plan: MAC   Post-op Pain Management:    Induction: Intravenous  PONV Risk Score and Plan: 2 and Propofol infusion and Treatment may vary due to age or medical condition  Airway Management Planned: Nasal Cannula  Additional Equipment: None  Intra-op Plan:   Post-operative Plan:   Informed Consent: I have reviewed the patients History and Physical, chart, labs and discussed the procedure including the risks, benefits and alternatives for the proposed anesthesia with the patient or authorized representative who has indicated his/her understanding and acceptance.     Dental advisory given  Plan Discussed with: CRNA and Surgeon  Anesthesia Plan Comments:         Anesthesia Quick Evaluation

## 2020-01-27 NOTE — ED Notes (Signed)
RN attempted report again , floor is still in huddle

## 2020-01-27 NOTE — Progress Notes (Signed)
Patient ID: Linda Becker, female   DOB: 1990-11-23, 29 y.o.   MRN: [id number] Patient has been admitted early this morning for hematemesis and is currently on octreotide and Protonix drips.  Patient seen and examined at bedside and plan of care discussed with her.  Monitor H&H.  Have consulted GI.  Follow recommendations.  Continue n.p.o. for now.

## 2020-01-28 DIAGNOSIS — K7469 Other cirrhosis of liver: Secondary | ICD-10-CM | POA: Diagnosis not present

## 2020-01-28 DIAGNOSIS — J45909 Unspecified asthma, uncomplicated: Secondary | ICD-10-CM | POA: Diagnosis present

## 2020-01-28 DIAGNOSIS — Z880 Allergy status to penicillin: Secondary | ICD-10-CM | POA: Diagnosis not present

## 2020-01-28 DIAGNOSIS — Z20822 Contact with and (suspected) exposure to covid-19: Secondary | ICD-10-CM | POA: Diagnosis present

## 2020-01-28 DIAGNOSIS — Z825 Family history of asthma and other chronic lower respiratory diseases: Secondary | ICD-10-CM | POA: Diagnosis not present

## 2020-01-28 DIAGNOSIS — K7581 Nonalcoholic steatohepatitis (NASH): Secondary | ICD-10-CM | POA: Diagnosis present

## 2020-01-28 DIAGNOSIS — K766 Portal hypertension: Secondary | ICD-10-CM | POA: Diagnosis not present

## 2020-01-28 DIAGNOSIS — Z83438 Family history of other disorder of lipoprotein metabolism and other lipidemia: Secondary | ICD-10-CM | POA: Diagnosis not present

## 2020-01-28 DIAGNOSIS — D731 Hypersplenism: Secondary | ICD-10-CM | POA: Diagnosis present

## 2020-01-28 DIAGNOSIS — Z8261 Family history of arthritis: Secondary | ICD-10-CM | POA: Diagnosis not present

## 2020-01-28 DIAGNOSIS — Z6841 Body Mass Index (BMI) 40.0 and over, adult: Secondary | ICD-10-CM | POA: Diagnosis not present

## 2020-01-28 DIAGNOSIS — K7460 Unspecified cirrhosis of liver: Secondary | ICD-10-CM | POA: Diagnosis present

## 2020-01-28 DIAGNOSIS — I8511 Secondary esophageal varices with bleeding: Secondary | ICD-10-CM | POA: Diagnosis not present

## 2020-01-28 DIAGNOSIS — Z23 Encounter for immunization: Secondary | ICD-10-CM | POA: Diagnosis not present

## 2020-01-28 DIAGNOSIS — Z8371 Family history of colonic polyps: Secondary | ICD-10-CM | POA: Diagnosis not present

## 2020-01-28 DIAGNOSIS — I8501 Esophageal varices with bleeding: Secondary | ICD-10-CM

## 2020-01-28 DIAGNOSIS — E039 Hypothyroidism, unspecified: Secondary | ICD-10-CM

## 2020-01-28 DIAGNOSIS — K3189 Other diseases of stomach and duodenum: Secondary | ICD-10-CM | POA: Diagnosis present

## 2020-01-28 DIAGNOSIS — D6959 Other secondary thrombocytopenia: Secondary | ICD-10-CM | POA: Diagnosis present

## 2020-01-28 DIAGNOSIS — K922 Gastrointestinal hemorrhage, unspecified: Secondary | ICD-10-CM | POA: Diagnosis not present

## 2020-01-28 DIAGNOSIS — K920 Hematemesis: Secondary | ICD-10-CM | POA: Diagnosis not present

## 2020-01-28 DIAGNOSIS — F319 Bipolar disorder, unspecified: Secondary | ICD-10-CM | POA: Diagnosis present

## 2020-01-28 DIAGNOSIS — G43909 Migraine, unspecified, not intractable, without status migrainosus: Secondary | ICD-10-CM | POA: Diagnosis present

## 2020-01-28 DIAGNOSIS — R1010 Upper abdominal pain, unspecified: Secondary | ICD-10-CM

## 2020-01-28 DIAGNOSIS — Z888 Allergy status to other drugs, medicaments and biological substances status: Secondary | ICD-10-CM | POA: Diagnosis not present

## 2020-01-28 DIAGNOSIS — Z833 Family history of diabetes mellitus: Secondary | ICD-10-CM | POA: Diagnosis not present

## 2020-01-28 DIAGNOSIS — K219 Gastro-esophageal reflux disease without esophagitis: Secondary | ICD-10-CM | POA: Diagnosis present

## 2020-01-28 LAB — CBC
HCT: 41.6 % (ref 36.0–46.0)
Hemoglobin: 13.5 g/dL (ref 12.0–15.0)
MCH: 30.9 pg (ref 26.0–34.0)
MCHC: 32.5 g/dL (ref 30.0–36.0)
MCV: 95.2 fL (ref 80.0–100.0)
Platelets: 53 10*3/uL — ABNORMAL LOW (ref 150–400)
RBC: 4.37 MIL/uL (ref 3.87–5.11)
RDW: 12.4 % (ref 11.5–15.5)
WBC: 5.5 10*3/uL (ref 4.0–10.5)
nRBC: 0 % (ref 0.0–0.2)

## 2020-01-28 LAB — GLUCOSE, CAPILLARY
Glucose-Capillary: 125 mg/dL — ABNORMAL HIGH (ref 70–99)
Glucose-Capillary: 143 mg/dL — ABNORMAL HIGH (ref 70–99)
Glucose-Capillary: 88 mg/dL (ref 70–99)

## 2020-01-28 LAB — BASIC METABOLIC PANEL
Anion gap: 7 (ref 5–15)
BUN: 8 mg/dL (ref 6–20)
CO2: 27 mmol/L (ref 22–32)
Calcium: 8.8 mg/dL — ABNORMAL LOW (ref 8.9–10.3)
Chloride: 106 mmol/L (ref 98–111)
Creatinine, Ser: 1.03 mg/dL — ABNORMAL HIGH (ref 0.44–1.00)
GFR calc Af Amer: >60 mL/min (ref >60)
GFR calc non Af Amer: >60 mL/min (ref >60)
Glucose, Bld: 135 mg/dL — ABNORMAL HIGH (ref 70–99)
Potassium: 4.4 mmol/L (ref 3.5–5.1)
Sodium: 140 mmol/L (ref 135–145)

## 2020-01-28 MED ORDER — LAMOTRIGINE 25 MG PO TABS
150.0000 mg | ORAL_TABLET | Freq: Every day | ORAL | Status: DC
  Administered 2020-01-28 – 2020-01-29 (×2): 150 mg via ORAL
  Filled 2020-01-28: qty 1
  Filled 2020-01-28: qty 2

## 2020-01-28 MED ORDER — ZOLPIDEM TARTRATE 5 MG PO TABS
5.0000 mg | ORAL_TABLET | Freq: Every evening | ORAL | Status: DC | PRN
  Administered 2020-01-28: 5 mg via ORAL
  Filled 2020-01-28: qty 1

## 2020-01-28 MED ORDER — LIDOCAINE VISCOUS HCL 2 % MT SOLN
15.0000 mL | Freq: Four times a day (QID) | OROMUCOSAL | Status: DC | PRN
  Filled 2020-01-28: qty 15

## 2020-01-28 MED ORDER — LIDOCAINE VISCOUS HCL 2 % MT SOLN
15.0000 mL | Freq: Once | OROMUCOSAL | Status: AC
  Administered 2020-01-28: 15 mL via ORAL
  Filled 2020-01-28 (×2): qty 15

## 2020-01-28 MED ORDER — MONTELUKAST SODIUM 10 MG PO TABS
10.0000 mg | ORAL_TABLET | Freq: Every day | ORAL | Status: DC
  Administered 2020-01-28 – 2020-01-29 (×2): 10 mg via ORAL
  Filled 2020-01-28 (×2): qty 1

## 2020-01-28 MED ORDER — LEVOTHYROXINE SODIUM 88 MCG PO TABS
88.0000 ug | ORAL_TABLET | Freq: Every day | ORAL | Status: DC
  Administered 2020-01-29: 88 ug via ORAL
  Filled 2020-01-28 (×2): qty 1

## 2020-01-28 MED ORDER — ALUM & MAG HYDROXIDE-SIMETH 200-200-20 MG/5ML PO SUSP
30.0000 mL | Freq: Once | ORAL | Status: AC
  Administered 2020-01-28: 30 mL via ORAL
  Filled 2020-01-28: qty 30

## 2020-01-28 MED ORDER — ALUM & MAG HYDROXIDE-SIMETH 200-200-20 MG/5ML PO SUSP
30.0000 mL | Freq: Four times a day (QID) | ORAL | Status: DC | PRN
  Administered 2020-01-28: 30 mL via ORAL
  Filled 2020-01-28: qty 30

## 2020-01-28 NOTE — Progress Notes (Signed)
Patient ID: Linda Becker, female   DOB: 1990/07/18, 29 y.o.   MRN: [id number]  PROGRESS NOTE    LAKEASHA PETION  ITG:[id number] DOB: 01-03-1991 DOA: 01/27/2020 PCP: Burnis Medin, MD   Brief Narrative:  29 y.o. 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  On presentation, hemoglobin was 15.3, platelets of 66.  CT of the abdomen and pelvis showed features concerning for cirrhosis of the liver and also possible gallbladder pathology; ultrasound of the abdomen was unremarkable.  She was started on Protonix and octreotide drips.  GI was consulted.  She underwent EGD with banding of esophageal varices on 01/27/2020.  Assessment & Plan:   Upper GI bleed, most likely variceal bleed in the setting of cirrhosis of liver with portal hypertension -Patient presented with hematemesis.  Status post EGD and banding of esophageal varices on 01/27/2020.  GI recommends to continue octreotide drips for at least 72 hours total.  Protonix drip has been changed to IV twice a day.  Continue Rocephin to complete a total of 5-day course of antibiotic therapy as per GI.  Repeat EGD in 4 weeks as an outpatient.  Hypothyroidism -resume Synthroid  Bipolar disorder -Resume Lamictal  History of asthma and allergic rhinitis -Stable.  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  Morbid obesity  -outpatient follow-up   DVT prophylaxis: SCDs Code Status: Full Family Communication: Patient at bedside Disposition Plan: Status is: Observation  The patient will require care spanning > 2 midnights and should be moved to inpatient because: Inpatient level of care appropriate due to severity of illness  Dispo: The patient is from: Home              Anticipated d/c is to: Home              Anticipated d/c date is: 1 day              Patient currently is not medically stable to d/c.   Consultants: GI  Procedures:  EGD on 01/27/2020 Impression:                - No gross lesions in esophagus proximally. Grade                            II and grade III esophageal varices with stigmata                            of recent bleeding in distal esophagus. Completely                            eradicated. Banded.                           - Z-line irregular, 37 cm from the incisors.                           - Portal hypertensive gastropathy. Nodular mucosa                            in the gastric antrum. Previously biopsied and  negative for HP.                           - No gross lesions in the duodenal bulb, in the                            first portion of the duodenum and in the second                            portion of the duodenum. Recommendation:           - The patient will be observed post-procedure,                            until all discharge criteria are met.                           - Return patient to hospital ward for ongoing care.                           - Full liquid diet today and advance to soft diet                            starting tomorrow if does well overnight.                           - Continue IV PPI BID x 72 hours total and then                            transition to PO PPI BID until completion of EVBL                            surveillance. This will help decrease post-banding                            ulceration.                           - Continue Octreotide drip for at least 72 hours                            total.                           - Ceftriaxone x 5-days (CTX during Inpatient stay                            and then Ciprofloxacin or Cepfodoxime to complete                            after discharge).                           - Depending on how patient's blood pressures  respond, consideration for Beta-Blockade to be                            initiated vs continued EVBL vs both for future.                            Will evaluate BP  in hospital and decide in coming                            days role for starting.                           - Repeat EGD in 4-weeks for EVBL surveillance and                            possible retreatment with Dr. [name].                           - The findings and recommendations were discussed                            with the patient.                           - The findings and recommendations were discussed                            with the patient's family.                           - The findings and recommendations were discussed                            with the referring physician.  Antimicrobials:  Rocephin from 01/27/2020 onwards  Subjective: Patient seen and examined at bedside. Patient did not have a good night and could not sleep well. Complaint of intermittent heartburn and some nausea. No vomiting. Feels a little better this morning after having GI cocktail. Still complains of some upper abdominal pain. No overnight fevers reported. No worsening shortness of breath. Objective: Vitals:   01/27/20 1945 01/27/20 2146 01/28/20 0129 01/28/20 0632  BP:  129/72 125/82 139/71  Pulse:  (!) 58 86 74  Resp:  _0 Temp:  99.3 F (37.4 C) 99.1 F (37.3 C) 98.7 F (37.1 C)  TempSrc:  Oral Oral Oral  SpO2: 100% 100% 100% 100%  Weight:      Height:        Intake/Output Summary (Last 24 hours) at 01/28/2020 0818 Last data filed at 01/28/2020 0606 Gross per 24 hour  Intake 1231.61 ml  Output --  Net 1231.61 ml   Filed Weights   01/27/20 0821  Weight: 124.7 kg    Examination:  General exam: Appears calm and comfortable  Respiratory system: Bilateral decreased breath sounds at bases Cardiovascular system: S1 & S2 heard, Rate controlled Gastrointestinal system: Abdomen is morbidly obese, nondistended, soft and nontender. Normal bowel sounds heard. Extremities: No cyanosis, clubbing, edema  Central nervous system: Alert and oriented. No focal neurological  deficits. Moving extremities Skin: No rashes, lesions or ulcers Psychiatry: Judgement and insight appear normal. Mood & affect appropriate.     Data Reviewed: I have personally reviewed following labs and imaging studies  CBC: Recent Labs  Lab 01/27/20 0141 01/27/20 0901 01/27/20 1649  WBC 7.6 4.8 7.0  HGB 15.3* 13.5 14.8  HCT 47.4* 41.3 44.7  MCV 96.1 94.9 95.7  PLT 66* 52* 63*   Basic Metabolic Panel: Recent Labs  Lab 01/27/20 0141  NA 136  K 4.2  CL 102  CO2 26  GLUCOSE 103*  BUN 9  CREATININE 0.80  CALCIUM 9.0   GFR: Estimated Creatinine Clearance: 147.1 mL/min (by C-G formula based on SCr of 0.8 mg/dL). Liver Function Tests: Recent Labs  Lab 01/27/20 0141  AST 66*  ALT 93*  ALKPHOS 115  BILITOT 0.7  PROT 6.1*  ALBUMIN 3.8   Recent Labs  Lab 01/27/20 0141  LIPASE 25   No results for input(s): AMMONIA in the last 168 hours. Coagulation Profile: No results for input(s): INR, PROTIME in the last 168 hours. Cardiac Enzymes: No results for input(s): CKTOTAL, CKMB, CKMBINDEX, TROPONINI in the last 168 hours. BNP (last 3 results) No results for input(s): PROBNP in the last 8760 hours. HbA1C: No results for input(s): HGBA1C in the last 72 hours. CBG: Recent Labs  Lab 01/27/20 1638 01/28/20 0122 01/28/20 0736  GLUCAP 74 125* 143*   Lipid Profile: No results for input(s): CHOL, HDL, LDLCALC, TRIG, CHOLHDL, LDLDIRECT in the last 72 hours. Thyroid Function Tests: No results for input(s): TSH, T4TOTAL, FREET4, T3FREE, THYROIDAB in the last 72 hours. Anemia Panel: No results for input(s): VITAMINB12, FOLATE, FERRITIN, TIBC, IRON, RETICCTPCT in the last 72 hours. Sepsis Labs: No results for input(s): PROCALCITON, LATICACIDVEN in the last 168 hours.  Recent Results (from the past 240 hour(s))  SARS Coronavirus 2 by RT PCR (hospital order, performed in [facility] hospital lab) Nasopharyngeal Nasopharyngeal Swab     Status: None   Collection Time:  01/27/20  5:48 AM   Specimen: Nasopharyngeal Swab  Result Value Ref Range Status   SARS Coronavirus 2 NEGATIVE NEGATIVE Final    Comment: (NOTE) SARS-CoV-2 target nucleic acids are NOT DETECTED.  The SARS-CoV-2 RNA is generally detectable in upper and lower respiratory specimens during the acute phase of infection. The lowest concentration of SARS-CoV-2 viral copies this assay can detect is 250 copies / mL. A negative result does not preclude SARS-CoV-2 infection and should not be used as the sole basis for treatment or other patient management decisions.  A negative result may occur with improper specimen collection / handling, submission of specimen other than nasopharyngeal swab, presence of viral mutation(s) within the areas targeted by this assay, and inadequate number of viral copies (<250 copies / mL). A negative result must be combined with clinical observations, patient history, and epidemiological information.  Fact Sheet for Patients:   StrictlyIdeas.no  Fact Sheet for Healthcare Providers: BankingDealers.co.za  This test is not yet approved or  cleared by the [place] FDA and has been authorized for detection and/or diagnosis of SARS-CoV-2 by FDA under an Emergency Use Authorization (EUA).  This EUA will remain in effect (meaning this test can be used) for the duration of the COVID-19 declaration under Section 564(b)(1) of the Act, 21 U.S.C. section 360bbb-3(b)(1), unless the authorization is terminated or revoked sooner.  Performed at [facility], [address] [place], [place] [address]  Radiology Studies: DG Chest 2 View  Result Date: 01/27/2020 CLINICAL DATA:  Abdominal pain EXAM: CHEST - 2 VIEW COMPARISON:  03/04/2019 FINDINGS: The heart size and mediastinal contours are within normal limits. Both lungs are clear. The visualized skeletal structures are unremarkable. IMPRESSION: Negative  Electronically Signed   By: Rolm Baptise M.D.   On: 01/27/2020 21:21   CT ABDOMEN PELVIS W CONTRAST  Result Date: 01/27/2020 CLINICAL DATA:  Abdomen distension with nausea and vomiting EXAM: CT ABDOMEN AND PELVIS WITH CONTRAST TECHNIQUE: Multidetector CT imaging of the abdomen and pelvis was performed using the standard protocol following bolus administration of intravenous contrast. CONTRAST:  143m OMNIPAQUE IOHEXOL 300 MG/ML  SOLN COMPARISON:  Ultrasound 08/02/2019, CT 07/14/2019, 02/25/2018 FINDINGS: Lower chest: Lung bases demonstrate no acute consolidation or pleural effusion. Cardiac size within normal limits. Hepatobiliary: Cirrhotic morphology of the liver. No focal hepatic abnormality. No biliary dilatation. Distended gallbladder with hazy edema at the gallbladder fossa. Generalized edema at the porta hepatis. Pancreas: Questionable mild peripancreatic inflammatory change about the proximal pancreas. Spleen: Markedly enlarged, measuring up to 20 cm AP. Adrenals/Urinary Tract: Adrenal glands are unremarkable. Kidneys are normal, without renal calculi, focal lesion, or hydronephrosis. Bladder is unremarkable. Stomach/Bowel: Status post gastric sleeve surgery. No evidence for a bowel obstruction or bowel wall thickening. Negative appendix. Vascular/Lymphatic: Recanalized paraumbilical vein. Multiple distal esophageal varices. Tortuous collateral vessels at the splenic hilum. Multiple mildly enlarged porta hepatis lymph nodes measuring up to 10 mm. Gastrohepatic lymph node measuring 17 mm. Multiple right lower quadrant lymph nodes. Reproductive: Uterus unremarkable. Benign-appearing cyst in the left adnexa Other: No free air. Small amount of free fluid in the pelvis. Hazy appearance of the mesentery. Musculoskeletal: No acute or significant osseous findings. IMPRESSION: 1. Cirrhotic morphology of the liver with sequela of portal hypertension including splenomegaly, esophageal varices, and multiple upper  abdominal collateral vessels. 2. Distended gallbladder with hazy edema at the gallbladder fossa, raising concern for acute cholecystitis. Suggest correlation with ultrasound. 3. Questionable mild peripancreatic inflammatory change about the proximal pancreas versus edema from inflammatory process at the gallbladder. Correlate with enzymes. 4. Small amount of free fluid in the pelvis. Electronically Signed   By: KDonavan FoilM.D.   On: 01/27/2020 03:49   DG Abd 2 Views  Result Date: 01/27/2020 CLINICAL DATA:  Abdominal pain, lower chest pain EXAM: ABDOMEN - 2 VIEW COMPARISON:  CT earlier today FINDINGS: Nonobstructive bowel gas pattern. No free air. Spleen is enlarged as seen on earlier CT. No suspicious calcification. No acute bony abnormality. IMPRESSION: Splenomegaly. Electronically Signed   By: KRolm BaptiseM.D.   On: 01/27/2020 21:20   UKoreaAbdomen Limited RUQ  Result Date: 01/27/2020 CLINICAL DATA:  Right upper quadrant abdominal pain.  Hematemesis. EXAM: ULTRASOUND ABDOMEN LIMITED RIGHT UPPER QUADRANT COMPARISON:  None. FINDINGS: Gallbladder: No gallstones or wall thickening visualized. No sonographic Murphy sign noted by sonographer. Maximal wall thickness is 3 mm, within normal limits. Common bile duct: Diameter: 3 mm, within normal limits Liver: No focal lesion identified. Within normal limits in parenchymal echogenicity. Portal vein is patent on color Doppler imaging with normal direction of blood flow towards the liver. Other: None. IMPRESSION: Negative right upper quadrant ultrasound. Electronically Signed   By: CSan MorelleM.D.   On: 01/27/2020 05:09        Scheduled Meds: . fluticasone  2 spray Each Nare Daily  . mometasone-formoterol  2 puff Inhalation BID  . olopatadine  1 drop Both Eyes BID  .  pantoprazole (PROTONIX) IV  40 mg Intravenous Q12H  . pneumococcal 23 valent vaccine  0.5 mL Intramuscular Tomorrow-1000   Continuous Infusions: . cefTRIAXone (ROCEPHIN)  IV 2 g  (01/28/20 0606)  . octreotide  (SANDOSTATIN)    IV infusion 50 mcg/hr (01/28/20 0606)          Aline August, MD [facility] 01/28/2020, 8:18 AM

## 2020-01-28 NOTE — Progress Notes (Signed)
Gastroenterology Inpatient Follow-up Note   PATIENT IDENTIFICATION  Linda Becker is a 29 y.o. female with a pmh significant for Karlene Lineman cirrhosis (status post sleeve gastrectomy) with associated portal hypertension now manifested as esophageal varices that recently bled, asthma, GERD, hypothyroidism, migraines.  GI inpatient service asked to evaluate in the setting of recent hematemesis. Hospital Day: 2  SUBJECTIVE  Patient underwent chest x-ray and KUB yesterday for postprocedural discomfort.  These were unremarkable. Patient continues to have nausea for which she is taking antiemetics around-the-clock. Abdominal discomfort persists but is slightly improved from yesterday into today.  She was able to tolerate soup but did not tolerate advancement of her diet thereafter even with food from [place].  She is going to try again at dinnertime.  No hematochezia or melena.  No coffee-ground emesis or hematemesis.   OBJECTIVE  Scheduled Inpatient Medications:  . fluticasone  2 spray Each Nare Daily  . lamoTRIgine  150 mg Oral Daily  . [START ON 01/29/2020] levothyroxine  88 mcg Oral QAC breakfast  . mometasone-formoterol  2 puff Inhalation BID  . montelukast  10 mg Oral Daily  . olopatadine  1 drop Both Eyes BID  . pantoprazole (PROTONIX) IV  40 mg Intravenous Q12H  . pneumococcal 23 valent vaccine  0.5 mL Intramuscular Tomorrow-1000   Continuous Inpatient Infusions:  . cefTRIAXone (ROCEPHIN)  IV 2 g (01/28/20 0606)  . octreotide  (SANDOSTATIN)    IV infusion 50 mcg/hr (01/28/20 1815)   PRN Inpatient Medications: acetaminophen, albuterol, alum & mag hydroxide-simeth **AND** lidocaine, hyoscyamine, ondansetron **OR** ondansetron (ZOFRAN) IV, promethazine, traMADol, zolpidem   Physical Examination  Temp:  [98.4 F (36.9 C)-99.3 F (37.4 C)] 98.7 F (37.1 C) (08/14 1359) Pulse Rate:  [58-86] 73 (08/14 1359) Resp:  [16-18] 16 (08/14 1359) BP: (120-139)/(56-82) 120/57 (08/14  1359) SpO2:  [97 %-100 %] 100 % (08/14 1359) Temp (24hrs), Avg:98.8 F (37.1 C), Min:98.4 F (36.9 C), Max:99.3 F (37.4 C)  Weight: 124.7 kg GEN: NAD, appears stated age, doesn't appear chronically ill, accompanied by husband at bedside PSYCH: Cooperative, without pressured speech EYE: Conjunctivae pink, sclerae anicteric ENT: MMM CV: Nontachycardic RESP: No audible wheezing GI: NABS, rounded, protuberant, tenderness to palpation upper abdomen, volitional guarding, no rebound MSK/EXT: Minimal lower extremity edema bilaterally SKIN: No jaundice NEURO:  Alert & Oriented x 3, no focal deficits, no evidence of asterixis   Review of Data   Laboratory Studies   Recent Labs  Lab 01/28/20 0813  NA 140  K 4.4  CL 106  CO2 27  BUN 8  CREATININE 1.03*  GLUCOSE 135*  CALCIUM 8.8*   Recent Labs  Lab 01/27/20 0141  AST 66*  ALT 93*  ALKPHOS 115    Recent Labs  Lab 01/27/20 0901 01/27/20 0901 01/27/20 1649 01/27/20 1649 01/28/20 0813  WBC 4.8   < > 7.0   < > 5.5  HGB 13.5   < > 14.8   < > 13.5  HCT 41.3   < > 44.7   < > 41.6  PLT 52*  --  63*  --  53*   < > = values in this interval not displayed.   No results for input(s): APTT, INR in the last 168 hours. Computed MELD-Na score unavailable. Necessary lab results were not found in the last year. Computed MELD score unavailable. Necessary lab results were not found in the last year.  Imaging Studies  DG Chest 2 View  Result Date: 01/27/2020 CLINICAL DATA:  Abdominal pain EXAM: CHEST - 2 VIEW COMPARISON:  03/04/2019 FINDINGS: The heart size and mediastinal contours are within normal limits. Both lungs are clear. The visualized skeletal structures are unremarkable. IMPRESSION: Negative Electronically Signed   By: Rolm Baptise M.D.   On: 01/27/2020 21:21   CT ABDOMEN PELVIS W CONTRAST  Result Date: 01/27/2020 CLINICAL DATA:  Abdomen distension with nausea and vomiting EXAM: CT ABDOMEN AND PELVIS WITH CONTRAST TECHNIQUE:  Multidetector CT imaging of the abdomen and pelvis was performed using the standard protocol following bolus administration of intravenous contrast. CONTRAST:  123m OMNIPAQUE IOHEXOL 300 MG/ML  SOLN COMPARISON:  Ultrasound 08/02/2019, CT 07/14/2019, 02/25/2018 FINDINGS: Lower chest: Lung bases demonstrate no acute consolidation or pleural effusion. Cardiac size within normal limits. Hepatobiliary: Cirrhotic morphology of the liver. No focal hepatic abnormality. No biliary dilatation. Distended gallbladder with hazy edema at the gallbladder fossa. Generalized edema at the porta hepatis. Pancreas: Questionable mild peripancreatic inflammatory change about the proximal pancreas. Spleen: Markedly enlarged, measuring up to 20 cm AP. Adrenals/Urinary Tract: Adrenal glands are unremarkable. Kidneys are normal, without renal calculi, focal lesion, or hydronephrosis. Bladder is unremarkable. Stomach/Bowel: Status post gastric sleeve surgery. No evidence for a bowel obstruction or bowel wall thickening. Negative appendix. Vascular/Lymphatic: Recanalized paraumbilical vein. Multiple distal esophageal varices. Tortuous collateral vessels at the splenic hilum. Multiple mildly enlarged porta hepatis lymph nodes measuring up to 10 mm. Gastrohepatic lymph node measuring 17 mm. Multiple right lower quadrant lymph nodes. Reproductive: Uterus unremarkable. Benign-appearing cyst in the left adnexa Other: No free air. Small amount of free fluid in the pelvis. Hazy appearance of the mesentery. Musculoskeletal: No acute or significant osseous findings. IMPRESSION: 1. Cirrhotic morphology of the liver with sequela of portal hypertension including splenomegaly, esophageal varices, and multiple upper abdominal collateral vessels. 2. Distended gallbladder with hazy edema at the gallbladder fossa, raising concern for acute cholecystitis. Suggest correlation with ultrasound. 3. Questionable mild peripancreatic inflammatory change about the  proximal pancreas versus edema from inflammatory process at the gallbladder. Correlate with enzymes. 4. Small amount of free fluid in the pelvis. Electronically Signed   By: KDonavan FoilM.D.   On: 01/27/2020 03:49   DG Abd 2 Views  Result Date: 01/27/2020 CLINICAL DATA:  Abdominal pain, lower chest pain EXAM: ABDOMEN - 2 VIEW COMPARISON:  CT earlier today FINDINGS: Nonobstructive bowel gas pattern. No free air. Spleen is enlarged as seen on earlier CT. No suspicious calcification. No acute bony abnormality. IMPRESSION: Splenomegaly. Electronically Signed   By: KRolm BaptiseM.D.   On: 01/27/2020 21:20   UKoreaAbdomen Limited RUQ  Result Date: 01/27/2020 CLINICAL DATA:  Right upper quadrant abdominal pain.  Hematemesis. EXAM: ULTRASOUND ABDOMEN LIMITED RIGHT UPPER QUADRANT COMPARISON:  None. FINDINGS: Gallbladder: No gallstones or wall thickening visualized. No sonographic Murphy sign noted by sonographer. Maximal wall thickness is 3 mm, within normal limits. Common bile duct: Diameter: 3 mm, within normal limits Liver: No focal lesion identified. Within normal limits in parenchymal echogenicity. Portal vein is patent on color Doppler imaging with normal direction of blood flow towards the liver. Other: None. IMPRESSION: Negative right upper quadrant ultrasound. Electronically Signed   By: CSan MorelleM.D.   On: 01/27/2020 05:09     ASSESSMENT  Ms. HButtersis a 29y.o. female with a pmh significant for NKarlene Linemancirrhosis (status post sleeve gastrectomy) with associated portal hypertension now manifested as esophageal varices that recently bled, asthma, GERD, hypothyroidism, migraines.  GI inpatient service asked to evaluate in the  setting of recent hematemesis.  Patient is hemodynamically stable.  Clinically slightly better than yesterday.  We will continue her IV Protonix and IV octreotide into tomorrow at minimum.  She can be transitioned to oral medications thereafter as per EGD report.   Abdominal discomfort and distention not clearly defined as of yet still.  If severe or not continue to improve would need to consider cross-sectional imaging repeat.  We are holding on beta-blockade at this time.  Long discussion with patient again about the results of yesterday's EGD.  All patient questions were answered to the best of my ability, and the patient agrees to the aforementioned plan of action with follow-up as indicated.   PLAN/RECOMMENDATIONS  Continue octreotide tonight and tomorrow Continue PPI IV tonight and tomorrow If patient doing well then octreotide may be stopped If patient doing well then PPI may be transitioned to p.o. twice daily Continue antibiotics for 5-day course total for cirrhosis infectious complication minimization/prophylaxis Advance diet as tolerated If severe pain then consider repeat cross-sectional imaging EGD follow-up in 4 to 6 weeks with Dr. [name] Patient will need [facility]   Please page/call with questions or concerns.   Justice Britain, MD [facility] Office # [phone]    LOS: 0 days  Irving Copas  01/28/2020, 8:15 PM

## 2020-01-29 ENCOUNTER — Encounter (HOSPITAL_COMMUNITY): Payer: Self-pay | Admitting: Gastroenterology

## 2020-01-29 DIAGNOSIS — K920 Hematemesis: Secondary | ICD-10-CM | POA: Diagnosis not present

## 2020-01-29 DIAGNOSIS — K7469 Other cirrhosis of liver: Secondary | ICD-10-CM | POA: Diagnosis not present

## 2020-01-29 DIAGNOSIS — K922 Gastrointestinal hemorrhage, unspecified: Secondary | ICD-10-CM | POA: Diagnosis not present

## 2020-01-29 LAB — CBC WITH DIFFERENTIAL/PLATELET
Abs Immature Granulocytes: 0.01 10*3/uL (ref 0.00–0.07)
Basophils Absolute: 0 10*3/uL (ref 0.0–0.1)
Basophils Relative: 1 %
Eosinophils Absolute: 0.1 10*3/uL (ref 0.0–0.5)
Eosinophils Relative: 3 %
HCT: 41 % (ref 36.0–46.0)
Hemoglobin: 13.3 g/dL (ref 12.0–15.0)
Immature Granulocytes: 0 %
Lymphocytes Relative: 29 %
Lymphs Abs: 1.4 10*3/uL (ref 0.7–4.0)
MCH: 31.4 pg (ref 26.0–34.0)
MCHC: 32.4 g/dL (ref 30.0–36.0)
MCV: 96.9 fL (ref 80.0–100.0)
Monocytes Absolute: 0.4 10*3/uL (ref 0.1–1.0)
Monocytes Relative: 8 %
Neutro Abs: 2.7 10*3/uL (ref 1.7–7.7)
Neutrophils Relative %: 59 %
Platelets: 49 10*3/uL — ABNORMAL LOW (ref 150–400)
RBC: 4.23 MIL/uL (ref 3.87–5.11)
RDW: 12.4 % (ref 11.5–15.5)
WBC: 4.7 10*3/uL (ref 4.0–10.5)
nRBC: 0 % (ref 0.0–0.2)

## 2020-01-29 LAB — CORTISOL: Cortisol, Plasma: 5.9 ug/dL

## 2020-01-29 LAB — HEPATITIS B SURFACE ANTIGEN: Hepatitis B Surface Ag: NONREACTIVE

## 2020-01-29 LAB — BASIC METABOLIC PANEL
Anion gap: 8 (ref 5–15)
BUN: 9 mg/dL (ref 6–20)
CO2: 27 mmol/L (ref 22–32)
Calcium: 8.7 mg/dL — ABNORMAL LOW (ref 8.9–10.3)
Chloride: 104 mmol/L (ref 98–111)
Creatinine, Ser: 1.07 mg/dL — ABNORMAL HIGH (ref 0.44–1.00)
GFR calc Af Amer: >60 mL/min (ref >60)
GFR calc non Af Amer: >60 mL/min (ref >60)
Glucose, Bld: 112 mg/dL — ABNORMAL HIGH (ref 70–99)
Potassium: 4 mmol/L (ref 3.5–5.1)
Sodium: 139 mmol/L (ref 135–145)

## 2020-01-29 LAB — LIPASE, BLOOD: Lipase: 22 U/L (ref 11–51)

## 2020-01-29 LAB — HEPATITIS B CORE ANTIBODY, TOTAL: Hep B Core Total Ab: NONREACTIVE

## 2020-01-29 LAB — HEPATITIS A ANTIBODY, TOTAL: hep A Total Ab: REACTIVE — AB

## 2020-01-29 LAB — MAGNESIUM: Magnesium: 2 mg/dL (ref 1.7–2.4)

## 2020-01-29 LAB — GLUCOSE, CAPILLARY: Glucose-Capillary: 101 mg/dL — ABNORMAL HIGH (ref 70–99)

## 2020-01-29 MED ORDER — ONDANSETRON HCL 4 MG PO TABS: 4.0000 mg | ORAL_TABLET | Freq: Four times a day (QID) | ORAL | 0 refills | Status: DC | PRN

## 2020-01-29 MED ORDER — ALBUTEROL SULFATE (2.5 MG/3ML) 0.083% IN NEBU: 2.5000 mg | INHALATION_SOLUTION | RESPIRATORY_TRACT | Status: DC | PRN

## 2020-01-29 MED ORDER — CIPROFLOXACIN HCL 500 MG PO TABS
500.0000 mg | ORAL_TABLET | Freq: Two times a day (BID) | ORAL | 0 refills | Status: DC
Start: 2020-01-29 — End: 2020-01-29

## 2020-01-29 MED ORDER — CIPROFLOXACIN HCL 500 MG PO TABS
500.0000 mg | ORAL_TABLET | Freq: Two times a day (BID) | ORAL | 0 refills | Status: AC
Start: 2020-01-29 — End: 2020-01-31

## 2020-01-29 MED ORDER — PANTOPRAZOLE SODIUM 40 MG PO TBEC: 40.0000 mg | DELAYED_RELEASE_TABLET | Freq: Two times a day (BID) | ORAL | 0 refills | Status: DC

## 2020-01-29 MED ORDER — TRAMADOL HCL 50 MG PO TABS: 50.0000 mg | ORAL_TABLET | Freq: Four times a day (QID) | ORAL | 0 refills | Status: DC | PRN

## 2020-01-29 MED ORDER — LEVOTHYROXINE SODIUM 88 MCG PO TABS: 88.0000 ug | ORAL_TABLET | Freq: Every day | ORAL | Status: DC

## 2020-01-29 NOTE — Plan of Care (Signed)

## 2020-01-29 NOTE — Progress Notes (Signed)
[name] to be D/C'd  per MD order. Discussed with the patient and all questions fully answered. An After Visit Summary was printed and given to the patient.   D/c education completed with patient/family including follow up instructions, medication list, d/c activities limitations if indicated, with other d/c instructions as indicated by MD - patient able to verbalize understanding, all questions fully answered.   Patient instructed to return to ED, call 911, or call MD for any changes in condition.

## 2020-01-29 NOTE — Discharge Summary (Addendum)
Physician Discharge Summary  Linda Becker CVE:[id number] DOB: 1991-06-14 DOA: 01/27/2020  PCP: Burnis Medin, MD  Admit date: 01/27/2020 Discharge date: 01/29/2020  Admitted From: Home Disposition: Home  Recommendations for Outpatient Follow-up:  1. Follow up with PCP in 1 week with repeat CBC/BMP 2. Outpatient follow-up with GI/Dr. [name] 3. 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  On presentation, hemoglobin was 15.3, platelets of 66.  CT of the abdomen and pelvis showed features concerning for cirrhosis of the liver and also possible gallbladder pathology; ultrasound of the abdomen was unremarkable.  She was started on Protonix and octreotide drips.  GI was consulted.  She underwent EGD with banding of esophageal varices on 01/27/2020.  Subsequently, she has remained stable and tolerated diet.  GI has cleared the patient for discharge on oral Protonix twice a day with outpatient follow-up with GI.   Discharge Diagnoses:   Upper GI bleed, most likely variceal bleed in the setting of cirrhosis of liver with portal hypertension -Patient presented with hematemesis.  Status post EGD and banding of esophageal varices on 01/27/2020.    Currently still on octreotide drip and IV Protonix twice a day.  Also on Rocephin, today's day #3.  GI has cleared the patient for discharge on oral Protonix twice a day along with 2 more days of oral antibiotics to finish 5-day course.  Will discharge on ciprofloxacin for 2 more days.  Patient is tolerating diet with no vomiting. Repeat EGD in 4 weeks as an outpatient.  Hypothyroidism -Continue Synthroid  Bipolar disorder -Continue Lamictal  History of asthma and allergic  rhinitis -Stable.  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  Outpatient follow-up.  Morbid obesity  -outpatient follow-up   Discharge Instructions  Allergies as of 01/29/2020      Reactions   Progesterone Rash   Was in a form of birth control.   Penicillins Rash   Did it involve swelling of the face/tongue/throat, SOB, or low BP? No Did it involve sudden or severe rash/hives, skin peeling, or any reaction on the inside of your mouth or nose? Yes Did you need to seek medical attention at a hospital or doctor's office? No When did it last happen?9 + months If all above answers are "NO", may proceed with cephalosporin use.      Medication List    STOP taking these medications   benzonatate 100 MG capsule Commonly known as: TESSALON   doxycycline 100 MG tablet Commonly known as: VIBRA-TABS   mupirocin cream 2 % Commonly known as: BACTROBAN   predniSONE 10 MG tablet Commonly known as: DELTASONE     TAKE these medications   albuterol 108 (90 Base) MCG/ACT inhaler Commonly known as: VENTOLIN HFA Can inhale two puffs every four to six hours as needed for cough or wheeze. What changed:   how much to take  how to take this  when to take this  reasons to take this   albuterol (2.5 MG/3ML) 0.083% nebulizer solution Commonly known as: PROVENTIL Take 3 mLs (2.5 mg total) by nebulization every 4 (four) hours as needed for wheezing or shortness of breath. What changed:   how much to take  how to take this  when to take this  reasons to take this  additional  instructions   calcium carbonate 1500 (600 Ca) MG Tabs tablet Commonly known as: OSCAL Take 600 mg of elemental calcium by mouth 2 (two) times daily with a meal.   cetirizine 5 MG tablet Commonly known as: ZYRTEC Take 5 mg by mouth 2 (two) times daily.   ciprofloxacin 500 MG tablet Commonly known as: Cipro Take 1 tablet (500 mg total) by mouth 2 (two) times daily for 2  days.   Dupixent 300 MG/2ML prefilled syringe Generic drug: dupilumab Inject 300 mg into the skin every 14 (fourteen) days.   lamoTRIgine 150 MG tablet Commonly known as: LAMICTAL Take 150 mg by mouth daily.   levothyroxine 88 MCG tablet Commonly known as: SYNTHROID Take 1 tablet (88 mcg total) by mouth daily before breakfast.   montelukast 10 MG tablet Commonly known as: SINGULAIR Take 1 tablet (10 mg total) by mouth daily.   multivitamin-prenatal 27-0.8 MG Tabs tablet Take 1 tablet by mouth 2 (two) times daily.   Olopatadine HCl 0.2 % Soln Can use one drop in each eye once daily if needed. What changed:   how much to take  how to take this  when to take this  reasons to take this   ondansetron 4 MG tablet Commonly known as: ZOFRAN Take 1 tablet (4 mg total) by mouth every 6 (six) hours as needed for nausea.   pantoprazole 40 MG tablet Commonly known as: PROTONIX Take 1 tablet (40 mg total) by mouth 2 (two) times daily before a meal. What changed: when to take this   Qnasl 80 MCG/ACT Aers Generic drug: Beclomethasone Dipropionate INSTILL 1 SPRAY IN EACH NOSTRIL ONCE DAILY AS DIRECTED What changed:   how much to take  how to take this  when to take this   Symbicort 160-4.5 MCG/ACT inhaler Generic drug: budesonide-formoterol INHALE 2 PUFFS BY MOUTH TWICE A DAY TO PREVENT COUGH OR WHEEZE. RINSE, GARGLE, AND SPIT AFTER USE.   traMADol 50 MG tablet Commonly known as: ULTRAM Take 1 tablet (50 mg total) by mouth every 6 (six) hours as needed for moderate pain.   vitamin C with rose hips 500 MG tablet Take 500 mg by mouth 2 (two) times daily.   vitamin E 180 MG (400 UNITS) capsule Take 400 Units by mouth 2 (two) times daily.       Allergies as of 01/29/2020      Reactions   Progesterone Rash   Was in a form of birth control.   Penicillins Rash   Did it involve swelling of the face/tongue/throat, SOB, or low BP? No Did it involve sudden or severe  rash/hives, skin peeling, or any reaction on the inside of your mouth or nose? Yes Did you need to seek medical attention at a hospital or doctor's office? No When did it last happen?9 + months If all above answers are "NO", may proceed with cephalosporin use.      Medication List    STOP taking these medications   benzonatate 100 MG capsule Commonly known as: TESSALON   doxycycline 100 MG tablet Commonly known as: VIBRA-TABS   mupirocin cream 2 % Commonly known as: BACTROBAN   predniSONE 10 MG tablet Commonly known as: DELTASONE     TAKE these medications   albuterol 108 (90 Base) MCG/ACT inhaler Commonly known as: VENTOLIN HFA Can inhale two puffs every four to six hours as needed for cough or wheeze. What changed:   how much to take  how to take this  when  to take this  reasons to take this   albuterol (2.5 MG/3ML) 0.083% nebulizer solution Commonly known as: PROVENTIL Take 3 mLs (2.5 mg total) by nebulization every 4 (four) hours as needed for wheezing or shortness of breath. What changed:   how much to take  how to take this  when to take this  reasons to take this  additional instructions   calcium carbonate 1500 (600 Ca) MG Tabs tablet Commonly known as: OSCAL Take 600 mg of elemental calcium by mouth 2 (two) times daily with a meal.   cetirizine 5 MG tablet Commonly known as: ZYRTEC Take 5 mg by mouth 2 (two) times daily.   ciprofloxacin 500 MG tablet Commonly known as: Cipro Take 1 tablet (500 mg total) by mouth 2 (two) times daily for 1 day.   Dupixent 300 MG/2ML prefilled syringe Generic drug: dupilumab Inject 300 mg into the skin every 14 (fourteen) days.   lamoTRIgine 150 MG tablet Commonly known as: LAMICTAL Take 150 mg by mouth daily.   levothyroxine 88 MCG tablet Commonly known as: SYNTHROID Take 1 tablet (88 mcg total) by mouth daily before breakfast.   montelukast 10 MG tablet Commonly known as: SINGULAIR Take 1  tablet (10 mg total) by mouth daily.   multivitamin-prenatal 27-0.8 MG Tabs tablet Take 1 tablet by mouth 2 (two) times daily.   Olopatadine HCl 0.2 % Soln Can use one drop in each eye once daily if needed. What changed:   how much to take  how to take this  when to take this  reasons to take this   ondansetron 4 MG tablet Commonly known as: ZOFRAN Take 1 tablet (4 mg total) by mouth every 6 (six) hours as needed for nausea.   pantoprazole 40 MG tablet Commonly known as: PROTONIX Take 1 tablet (40 mg total) by mouth 2 (two) times daily before a meal. What changed: when to take this   Qnasl 80 MCG/ACT Aers Generic drug: Beclomethasone Dipropionate INSTILL 1 SPRAY IN EACH NOSTRIL ONCE DAILY AS DIRECTED What changed:   how much to take  how to take this  when to take this   Symbicort 160-4.5 MCG/ACT inhaler Generic drug: budesonide-formoterol INHALE 2 PUFFS BY MOUTH TWICE A DAY TO PREVENT COUGH OR WHEEZE. RINSE, GARGLE, AND SPIT AFTER USE.   traMADol 50 MG tablet Commonly known as: ULTRAM Take 1 tablet (50 mg total) by mouth every 6 (six) hours as needed for moderate pain.   vitamin C with rose hips 500 MG tablet Take 500 mg by mouth 2 (two) times daily.   vitamin E 180 MG (400 UNITS) capsule Take 400 Units by mouth 2 (two) times daily.       Follow-up Information    Panosh, Standley Brooking, MD. Schedule an appointment as soon as possible for a visit in 1 week(s).   Specialties: Internal Medicine, Pediatrics Why: With repeat CBC/BMP Contact information: [place] [address] [phone]              Allergies  Allergen Reactions  . Progesterone Rash    Was in a form of birth control.  . Penicillins Rash    Did it involve swelling of the face/tongue/throat, SOB, or low BP? No Did it involve sudden or severe rash/hives, skin peeling, or any reaction on the inside of your mouth or nose? Yes Did you need to seek medical attention at  a hospital or doctor's office? No When did it last happen?9 +  months If all above answers are "NO", may proceed with cephalosporin use.      Consultations:  GI   Procedures/Studies: DG Chest 2 View  Result Date: 01/27/2020 CLINICAL DATA:  Abdominal pain EXAM: CHEST - 2 VIEW COMPARISON:  03/04/2019 FINDINGS: The heart size and mediastinal contours are within normal limits. Both lungs are clear. The visualized skeletal structures are unremarkable. IMPRESSION: Negative Electronically Signed   By: Rolm Baptise M.D.   On: 01/27/2020 21:21   DG Wrist Complete Right  Result Date: 01/14/2020 CLINICAL DATA:  29 year old presenting with a wound over the dorsum of the RIGHT wrist associated with pain and swelling that began approximately 10 days ago. EXAM: RIGHT WRIST - COMPLETE 3+ VIEW COMPARISON:  None. FINDINGS: Soft tissue swelling overlying the distal ulna and the ulnar side of the wrist, with an ulceration in the dorsal soft tissues. No underlying osseous abnormality. No evidence of osteomyelitis. No acute, subacute or healed fractures. Well-preserved joint spaces. Well-preserved bone mineral density. IMPRESSION: 1. No osseous abnormality. 2. Soft tissue swelling overlying the distal ulna and the ulnar side of the wrist, with an ulceration in the dorsal soft tissues. Electronically Signed   By: Evangeline Dakin M.D.   On: 01/14/2020 14:26   CT ABDOMEN PELVIS W CONTRAST  Result Date: 01/27/2020 CLINICAL DATA:  Abdomen distension with nausea and vomiting EXAM: CT ABDOMEN AND PELVIS WITH CONTRAST TECHNIQUE: Multidetector CT imaging of the abdomen and pelvis was performed using the standard protocol following bolus administration of intravenous contrast. CONTRAST:  157m OMNIPAQUE IOHEXOL 300 MG/ML  SOLN COMPARISON:  Ultrasound 08/02/2019, CT 07/14/2019, 02/25/2018 FINDINGS: Lower chest: Lung bases demonstrate no acute consolidation or pleural effusion. Cardiac size within normal limits.  Hepatobiliary: Cirrhotic morphology of the liver. No focal hepatic abnormality. No biliary dilatation. Distended gallbladder with hazy edema at the gallbladder fossa. Generalized edema at the porta hepatis. Pancreas: Questionable mild peripancreatic inflammatory change about the proximal pancreas. Spleen: Markedly enlarged, measuring up to 20 cm AP. Adrenals/Urinary Tract: Adrenal glands are unremarkable. Kidneys are normal, without renal calculi, focal lesion, or hydronephrosis. Bladder is unremarkable. Stomach/Bowel: Status post gastric sleeve surgery. No evidence for a bowel obstruction or bowel wall thickening. Negative appendix. Vascular/Lymphatic: Recanalized paraumbilical vein. Multiple distal esophageal varices. Tortuous collateral vessels at the splenic hilum. Multiple mildly enlarged porta hepatis lymph nodes measuring up to 10 mm. Gastrohepatic lymph node measuring 17 mm. Multiple right lower quadrant lymph nodes. Reproductive: Uterus unremarkable. Benign-appearing cyst in the left adnexa Other: No free air. Small amount of free fluid in the pelvis. Hazy appearance of the mesentery. Musculoskeletal: No acute or significant osseous findings. IMPRESSION: 1. Cirrhotic morphology of the liver with sequela of portal hypertension including splenomegaly, esophageal varices, and multiple upper abdominal collateral vessels. 2. Distended gallbladder with hazy edema at the gallbladder fossa, raising concern for acute cholecystitis. Suggest correlation with ultrasound. 3. Questionable mild peripancreatic inflammatory change about the proximal pancreas versus edema from inflammatory process at the gallbladder. Correlate with enzymes. 4. Small amount of free fluid in the pelvis. Electronically Signed   By: KDonavan FoilM.D.   On: 01/27/2020 03:49   DG Abd 2 Views  Result Date: 01/27/2020 CLINICAL DATA:  Abdominal pain, lower chest pain EXAM: ABDOMEN - 2 VIEW COMPARISON:  CT earlier today FINDINGS: Nonobstructive  bowel gas pattern. No free air. Spleen is enlarged as seen on earlier CT. No suspicious calcification. No acute bony abnormality. IMPRESSION: Splenomegaly. Electronically Signed   By: KRolm BaptiseM.D.  On: 01/27/2020 21:20   US Abdomen Limited RUQ  Result Date: 01/27/2020 CLINICAL DATA:  Right upper quadrant abdominal pain.  Hematemesis. EXAM: ULTRASOUND ABDOMEN LIMITED RIGHT UPPER QUADRANT COMPARISON:  None. FINDINGS: Gallbladder: No gallstones or wall thickening visualized. No sonographic Murphy sign noted by sonographer. Maximal wall thickness is 3 mm, within normal limits. Common bile duct: Diameter: 3 mm, within normal limits Liver: No focal lesion identified. Within normal limits in parenchymal echogenicity. Portal vein is patent on color Doppler imaging with normal direction of blood flow towards the liver. Other: None. IMPRESSION: Negative right upper quadrant ultrasound. Electronically Signed   By: San Morelle M.D.   On: 01/27/2020 05:09    EGD on 01/27/2020 Impression: - No gross lesions in esophagus proximally. Grade  II and grade III esophageal varices with stigmata  of recent bleeding in distal esophagus. Completely  eradicated. Banded. - Z-line irregular, 37 cm from the incisors. - Portal hypertensive gastropathy. Nodular mucosa  in the gastric antrum. Previously biopsied and  negative for HP. - No gross lesions in the duodenal bulb, in the  first portion of the duodenum and in the second  portion of the duodenum. Recommendation: - The patient will be observed post-procedure,  until all discharge criteria are met. - Return patient to hospital  ward for ongoing care. - Full liquid diet today and advance to soft diet  starting tomorrow if does well overnight. - Continue IV PPI BID x 72 hours total and then  transition to PO PPI BID until completion of EVBL  surveillance. This will help decrease post-banding  ulceration. - Continue Octreotide drip for at least 72 hours  total. - Ceftriaxone x 5-days (CTX during Inpatient stay  and then Ciprofloxacin or Cepfodoxime to complete  after discharge). - Depending on how patient's blood pressures  respond, consideration for Beta-Blockade to be  initiated vs continued EVBL vs both for future.  Will evaluate BP in hospital and decide in coming  days role for starting. - Repeat EGD in 4-weeks for EVBL surveillance and  possible retreatment with Dr. [name]. - The findings and recommendations were discussed  with the patient. - The findings and recommendations were discussed  with the patient's family. - The findings and recommendations were discussed  with the referring physician.   Subjective: Patient seen and examined at bedside.  She feels better.  Complains of some nausea but no vomiting.  Denies worsening abdominal pain.  Denies any hematemesis.  Feels okay to go home today.  Discharge Exam: Vitals:   01/29/20 0412 01/29/20 0916  BP: 100/61   Pulse: 79   Resp: 18    Temp: 98.4 F (36.9 C)   SpO2: 98% 99%    General: Pt is alert, awake, not in acute distress Cardiovascular: rate controlled, S1/S2 + Respiratory: bilateral decreased breath sounds at bases Abdominal: Soft, morbidly obese, NT, ND, bowel sounds + Extremities: Trace lower extremity edema; no cyanosis    The results of significant diagnostics from this hospitalization (including imaging, microbiology, ancillary and laboratory) are listed below for reference.     Microbiology: Recent Results (from the past 240 hour(s))  SARS Coronavirus 2 by RT PCR (hospital order, performed in [facility] hospital lab) Nasopharyngeal Nasopharyngeal Swab     Status: None   Collection Time: 01/27/20  5:48 AM   Specimen: Nasopharyngeal Swab  Result Value Ref Range Status   SARS Coronavirus 2 NEGATIVE NEGATIVE Final    Comment: (NOTE) SARS-CoV-2 target nucleic acids are NOT DETECTED.  The SARS-CoV-2 RNA is generally detectable in upper and lower respiratory specimens during the acute phase of infection. The lowest concentration of SARS-CoV-2 viral copies this assay can detect is 250 copies / mL. A negative result does not preclude SARS-CoV-2 infection and should not be used as the sole basis for treatment or other patient management decisions.  A negative result may occur with improper specimen collection / handling, submission of specimen other than nasopharyngeal swab, presence of viral mutation(s) within the areas targeted by this assay, and inadequate number of viral copies (<250 copies / mL). A negative result must be combined with clinical observations, patient history, and epidemiological information.  Fact Sheet for Patients:   StrictlyIdeas.no  Fact Sheet for Healthcare Providers: BankingDealers.co.za  This test is not yet approved or  cleared by the [place] FDA and has been authorized for detection and/or diagnosis of SARS-CoV-2  by FDA under an Emergency Use Authorization (EUA).  This EUA will remain in effect (meaning this test can be used) for the duration of the COVID-19 declaration under Section 564(b)(1) of the Act, 21 U.S.C. section 360bbb-3(b)(1), unless the authorization is terminated or revoked sooner.  Performed at [facility], [place] [address], [place], [place] [address]      Labs: BNP (last 3 results) No results for input(s): BNP in the last 8760 hours. Basic Metabolic Panel: Recent Labs  Lab 01/27/20 0141 01/28/20 0813 01/29/20 0233  NA 136 140 139  K 4.2 4.4 4.0  CL 102 106 104  CO2 _0 GLUCOSE 103* 135* 112*  BUN _1 CREATININE 0.80 1.03* 1.07*  CALCIUM 9.0 8.8* 8.7*  MG  --   --  2.0   Liver Function Tests: Recent Labs  Lab 01/27/20 0141  AST 66*  ALT 93*  ALKPHOS 115  BILITOT 0.7  PROT 6.1*  ALBUMIN 3.8   Recent Labs  Lab 01/27/20 0141 01/29/20 0846  LIPASE 25 22   No results for input(s): AMMONIA in the last 168 hours. CBC: Recent Labs  Lab 01/27/20 0141 01/27/20 0901 01/27/20 1649 01/28/20 0813 01/29/20 0233  WBC 7.6 4.8 7.0 5.5 4.7  NEUTROABS  --   --   --   --  2.7  HGB 15.3* 13.5 14.8 13.5 13.3  HCT 47.4* 41.3 44.7 41.6 41.0  MCV 96.1 94.9 95.7 95.2 96.9  PLT 66* 52* 63* 53* 49*   Cardiac Enzymes: No results for input(s): CKTOTAL, CKMB, CKMBINDEX, TROPONINI in the last 168 hours. BNP: Invalid input(s): POCBNP CBG: Recent Labs  Lab 01/27/20 1638 01/28/20 0122 01/28/20 0736 01/28/20 1607 01/29/20 0759  GLUCAP 74 125* 143* 88 101*   D-Dimer No results for input(s): DDIMER in the last 72 hours. Hgb A1c No results for input(s): HGBA1C in the last 72 hours. Lipid Profile No results for input(s): CHOL, HDL, LDLCALC, TRIG, CHOLHDL, LDLDIRECT in the last 72 hours. Thyroid function studies No results for input(s): TSH, T4TOTAL, T3FREE, THYROIDAB in the last 72 hours.  Invalid input(s): FREET3 Anemia work up No results for  input(s): VITAMINB12, FOLATE, FERRITIN, TIBC, IRON, RETICCTPCT in the last 72 hours. Urinalysis    Component Value Date/Time   COLORURINE YELLOW 12/20/2017 1410   APPEARANCEUR HAZY (A) 12/20/2017 1410   LABSPEC 1.014 12/20/2017 1410   PHURINE 6.0 12/20/2017 1410   GLUCOSEU NEGATIVE 12/20/2017 1410   HGBUR NEGATIVE 12/20/2017 1410   BILIRUBINUR NEGATIVE 12/20/2017 1410   KETONESUR NEGATIVE 12/20/2017 1410   PROTEINUR NEGATIVE 12/20/2017  [place] 12/20/2017 1410   LEUKOCYTESUR NEGATIVE 12/20/2017 1410   Sepsis Labs Invalid input(s): PROCALCITONIN,  WBC,  LACTICIDVEN Microbiology Recent Results (from the past 240 hour(s))  SARS Coronavirus 2 by RT PCR (hospital order, performed in [facility] hospital lab) Nasopharyngeal Nasopharyngeal Swab     Status: None   Collection Time: 01/27/20  5:48 AM   Specimen: Nasopharyngeal Swab  Result Value Ref Range Status   SARS Coronavirus 2 NEGATIVE NEGATIVE Final    Comment: (NOTE) SARS-CoV-2 target nucleic acids are NOT DETECTED.  The SARS-CoV-2 RNA is generally detectable in upper and lower respiratory specimens during the acute phase of infection. The lowest concentration of SARS-CoV-2 viral copies this assay can detect is 250 copies / mL. A negative result does not preclude SARS-CoV-2 infection and should not be used as the sole basis for treatment or other patient management decisions.  A negative result may occur with improper specimen collection / handling, submission of specimen other than nasopharyngeal swab, presence of viral mutation(s) within the areas targeted by this assay, and inadequate number of viral copies (<250 copies / mL). A negative result must be combined with clinical observations, patient history, and epidemiological information.  Fact Sheet for Patients:   StrictlyIdeas.no  Fact Sheet for Healthcare Providers: BankingDealers.co.za  This test is not  yet approved or  cleared by the [place] FDA and has been authorized for detection and/or diagnosis of SARS-CoV-2 by FDA under an Emergency Use Authorization (EUA).  This EUA will remain in effect (meaning this test can be used) for the duration of the COVID-19 declaration under Section 564(b)(1) of the Act, 21 U.S.C. section 360bbb-3(b)(1), unless the authorization is terminated or revoked sooner.  Performed at [facility], [place] [address], [place], [place] [address]      Time coordinating discharge: 35 minutes  SIGNED:   Aline August, MD  [facility] 01/29/2020, 10:07 AM

## 2020-01-29 NOTE — Anesthesia Postprocedure Evaluation (Signed)
Anesthesia Post Note  Patient: [name]  Procedure(s) Performed: ESOPHAGOGASTRODUODENOSCOPY (EGD) WITH PROPOFOL (N/A ) GASTRIC VARICES BANDING     Patient location during evaluation: Endoscopy Anesthesia Type: MAC Level of consciousness: patient cooperative and awake Pain management: pain level controlled Vital Signs Assessment: post-procedure vital signs reviewed and stable Respiratory status: spontaneous breathing, nonlabored ventilation, respiratory function stable and patient connected to nasal cannula oxygen Cardiovascular status: stable and blood pressure returned to baseline Postop Assessment: no apparent nausea or vomiting Anesthetic complications: no   No complications documented.  Last Vitals:  Vitals:   01/28/20 2106 01/29/20 0412  BP: (!) 93/58 100/61  Pulse: 78 79  Resp: 16 18  Temp: 36.9 C 36.9 C  SpO2: 100% 98%    Last Pain:  Vitals:   01/29/20 0412  TempSrc: Oral  PainSc:                  Mitcheal Sweetin

## 2020-01-30 ENCOUNTER — Other Ambulatory Visit: Payer: Self-pay | Admitting: *Deleted

## 2020-01-30 ENCOUNTER — Telehealth: Payer: Self-pay | Admitting: Gastroenterology

## 2020-01-30 LAB — HEPATITIS B SURFACE ANTIBODY, QUANTITATIVE: Hep B S AB Quant (Post): <3.1 m[IU]/mL — ABNORMAL LOW (ref >9.9)

## 2020-01-30 LAB — HCV AB W REFLEX TO QUANT PCR: HCV Ab: <0.1 s/co ratio (ref 0.0–0.9)

## 2020-01-30 LAB — HCV INTERPRETATION

## 2020-01-30 NOTE — Telephone Encounter (Signed)
Pt scheduled for hospital follow-up with Dr. Norman Herrlich 02/24/20@3 :20pm. Pt aware of appt.

## 2020-01-30 NOTE — Patient Outreach (Addendum)
[place] [facility]) Care Management  01/30/2020  Linda Becker 03/03/1991 [id number]   Transition of care telephone call  Referral received:01/30/20 Initial outreach:01/30/20 Insurance: [facility]   Initial unsuccessful telephone call to patient's preferred number in order to complete transition of care assessment; no answer, left HIPAA compliant voicemail message requesting return call.   Objective: Per the electronic medical record, Linda Becker  was hospitalized at [facility]  from 8/13-8/15/21 for Hematemesis, idiopathic esophageal varices with bleeding   . Comorbidities include: Asthma , hypothyroidism, Cirrhosis of liver,Portal Hypertension ,Migraine, Bipolar,Laproscopic gastric sleeve resection   . She  was discharged to home on 01/29/20 without the need for home health services or durable medical equipment per the discharge summary.   Plan: This RNCM will route unsuccessful outreach letter with [place] Management pamphlet and 24 hour Nurse Advice Line Magnet to [place] Management clinical pool to be mailed to patient's home address. This RNCM will attempt another outreach within 4 business days.   [name], RN, BSN  [place] Management Coordinator  [phone] Mobile [phone]- Toll Free Main Office

## 2020-01-31 ENCOUNTER — Encounter: Payer: Self-pay | Admitting: *Deleted

## 2020-01-31 DIAGNOSIS — I8500 Esophageal varices without bleeding: Secondary | ICD-10-CM | POA: Diagnosis not present

## 2020-01-31 DIAGNOSIS — K7581 Nonalcoholic steatohepatitis (NASH): Secondary | ICD-10-CM | POA: Diagnosis not present

## 2020-01-31 DIAGNOSIS — Z7189 Other specified counseling: Secondary | ICD-10-CM | POA: Diagnosis not present

## 2020-02-01 ENCOUNTER — Inpatient Hospital Stay: Payer: 59 | Admitting: Internal Medicine

## 2020-02-02 ENCOUNTER — Other Ambulatory Visit: Payer: Self-pay | Admitting: *Deleted

## 2020-02-02 NOTE — Patient Outreach (Signed)
[facility]) Care Management  [place]  02/02/2020   Linda Becker 23-Jan-1991 [id number]   Transition of care call Referral received: 01/30/20 Initial outreach attempt: 01/30/20 Insurance: [employer]    2nd unsuccessful telephone call to patient's preferred contact number in order to complete post hospital discharge transition of care assessment , no answer left HIPAA compliant message requesting return call.    Objective: Per the electronic medical record, Linda Becker  was hospitalized at [facility]  from 8/13-8/15/21 for Hematemesis, idiopathic esophageal varices with bleeding   . Comorbidities include: Asthma , hypothyroidism, Cirrhosis of liver,Portal Hypertension ,Migraine, Bipolar,Laproscopic gastric sleeve resection   . She  was discharged to home on 01/29/20 without the need for home health services or durable medical equipment per the discharge summary.    Plan If no return call from patient will attempt 3rd outreach in the next 4 business days.    [name], RN, BSN  [place] Management Coordinator  [phone]- Mobile [phone]- Toll Free Main Office

## 2020-02-03 ENCOUNTER — Ambulatory Visit: Payer: 59 | Admitting: Professional

## 2020-02-03 MED FILL — GABAPENTIN 300 MG CAPSULE: 300 | 30 days supply | Qty: 120 | Fill #1

## 2020-02-03 MED FILL — PANTOPRAZOLE SOD DR 40 MG T: 40 | 90 days supply | Qty: 90 | Fill #1

## 2020-02-03 MED FILL — PROMETHAZINE 12.5 MG TABLET: 12.5 | 5 days supply | Qty: 20 | Fill #2

## 2020-02-06 NOTE — Progress Notes (Deleted)
No chief complaint on file.   HPI: [name] 29 y.o. come in for post hospital care   For varices and cirrhiotic liver  And ug bleed?   Has fu with gi  ROS: See pertinent positives and negatives per HPI.  Past Medical History:  Diagnosis Date  . Allergic rhinitis    remote hx of allergy shots  . Asthma    since childhood  on controller meds extrinsic dr Carmelina Peal  . Bipolar II disorder ([place])   . Cirrhosis ([place])   . Esophageal varices ([place])   . Family history of adverse reaction to anesthesia   . Gallbladder sludge   . Gastropathy   . GERD (gastroesophageal reflux disease)    on nexium  for  long term sx since childhood  . H/O miscarriage, not currently pregnant    [redacted] weeks  march 2016  . Hepatic steatosis   . Hypothyroidism   . Migraine   . Murmur    pt reports MVP  . Portal hypertension ([place])   . Sessile colonic polyp   . Splenomegaly   . Steatohepatitis   . Syncope    under eval ? cause  . Tachycardia    episodes with near syncope eval dr Nadyne Coombes  on no meds dced LABA  . Thrombocytopenia ([place])   . Upper GI bleed     Family History  Problem Relation Age of Onset  . Asthma Father   . Arthritis Father   . Hyperlipidemia Father   . Heart disease Father   . Hypertension Father   . Diabetes Father   . Colon polyps Father   . Arthritis Mother   . Hyperlipidemia Mother   . Heart disease Mother   . Hypertension Mother   . Diabetes Mother   . Kidney disease Mother   . Liver disease Mother        advanced non-alcoholic cirrhosis  . Bleeding Disorder Mother   . Colon polyps Mother   . Arthritis Maternal Grandmother   . Breast cancer Maternal Grandmother   . Thyroid disease Maternal Grandmother   . Colon cancer Paternal Grandfather   . Diabetes Paternal Grandfather   . Liver cancer Paternal Grandfather   . Stomach cancer Paternal Grandfather   . Breast cancer Paternal Grandmother   . Diabetes Paternal Grandmother   . Breast cancer Paternal Aunt   .  Esophageal cancer Paternal Aunt   . Breast cancer Maternal Aunt   . Diabetes Maternal Grandfather   . Pancreatic cancer Maternal Grandfather     Social History   Socioeconomic History  . Marital status: Married    Spouse name: Not on file  . Number of children: 1  . Years of education: Not on file  . Highest education level: Not on file  Occupational History  . Not on file  Tobacco Use  . Smoking status: Never Smoker  . Smokeless tobacco: Never Used  Vaping Use  . Vaping Use: Never used  Substance and Sexual Activity  . Alcohol use: No    Alcohol/week: 0.0 standard drinks  . Drug use: No  . Sexual activity: Yes    Birth control/protection: None  Other Topics Concern  . 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 Difficulty of Paying Living Expenses: Not on file  Food Insecurity:   . Worried About Charity fundraiser in the Last Year: Not on file  . Ran Out of Food in the Last Year: Not on file  Transportation Needs:   . Lack of Transportation (Medical): Not on file  . Lack of Transportation (Non-Medical): Not on file  Physical Activity:   . Days of Exercise per Week: Not on file  . Minutes of Exercise per Session: Not on file  Stress:   . Feeling of Stress : Not on file  Social Connections:   . Frequency of Communication with Friends and Family: Not on file  . Frequency of Social Gatherings with Friends and Family: Not on file  . Attends Religious Services: Not on file  . Active Member of Clubs or Organizations: Not on file  . Attends Archivist Meetings: Not on file  . Marital Status: Not on file    Outpatient Medications Prior to Visit  Medication Sig Dispense  Refill  . albuterol (PROVENTIL) (2.5 MG/3ML) 0.083% nebulizer solution Take 3 mLs (2.5 mg total) by nebulization every 4 (four) hours as needed for wheezing or shortness of breath.    Marland Kitchen albuterol (VENTOLIN HFA) 108 (90 Base) MCG/ACT inhaler Can inhale two puffs every four to six hours as needed for cough or wheeze. (Patient taking differently: Inhale 2 puffs into the lungs every 6 (six) hours as needed for wheezing or shortness of breath. Can inhale two puffs every four to six hours as needed for cough or wheeze.) 18 g 1  . Ascorbic Acid (VITAMIN C WITH ROSE HIPS) 500 MG tablet Take 500 mg by mouth 2 (two) times daily.    . Beclomethasone Dipropionate (QNASL) 80 MCG/ACT AERS INSTILL 1 SPRAY IN EACH NOSTRIL ONCE DAILY AS DIRECTED (Patient taking differently: Place 1 spray into the nose daily. INSTILL 1 SPRAY IN EACH NOSTRIL ONCE DAILY AS DIRECTED) 31.8 g 1  . calcium carbonate (OSCAL) 1500 (600 Ca) MG TABS tablet Take 600 mg of elemental calcium by mouth 2 (two) times daily with a meal.    . cetirizine (ZYRTEC) 5 MG tablet Take 5 mg by mouth 2 (two) times daily.    . dupilumab (DUPIXENT) 300 MG/2ML prefilled syringe Inject 300 mg into the skin every 14 (fourteen) days. 4 mL 11  . lamoTRIgine (LAMICTAL) 150 MG tablet Take 150 mg by mouth daily.    Marland Kitchen levothyroxine (SYNTHROID) 88 MCG tablet Take 1 tablet (88 mcg total) by mouth daily before breakfast.    . montelukast (SINGULAIR) 10 MG tablet Take 1 tablet (10 mg total) by mouth daily. 90 tablet 1  . Olopatadine HCl 0.2 % SOLN Can use one drop in each eye once daily if needed. (Patient taking differently: Place 1 drop into both eyes daily as needed (allergies). Can use one drop in each eye once daily if needed.) 7.5 mL 1  . ondansetron (ZOFRAN) 4 MG tablet Take 1 tablet (4 mg total) by mouth every 6 (six) hours as needed for nausea. 20 tablet 0  . pantoprazole (PROTONIX) 40 MG tablet Take 1 tablet (40 mg total) by mouth 2 (two) times daily before a meal. 60  tablet 0  . Prenatal Vit-Fe Fumarate-FA (MULTIVITAMIN-PRENATAL) 27-0.8 MG TABS tablet Take 1 tablet by mouth 2 (two)  times daily.    . SYMBICORT 160-4.5 MCG/ACT inhaler INHALE 2 PUFFS BY MOUTH TWICE A DAY TO PREVENT COUGH OR WHEEZE. RINSE, GARGLE, AND SPIT AFTER USE. 30.6 g 1  . traMADol (ULTRAM) 50 MG tablet Take 1 tablet (50 mg total) by mouth every 6 (six) hours as needed for moderate pain. 20 tablet 0  . vitamin E 400 UNIT capsule Take 400 Units by mouth 2 (two) times daily.     Facility-Administered Medications Prior to Visit  Medication Dose Route Frequency Provider Last Rate Last Admin  . promethazine (PHENERGAN) tablet 12.5-25 mg  12.5-25 mg Oral Q4H PRN Clovis Riley, MD       Or  . promethazine (PHENERGAN) suppository 12.5-25 mg  12.5-25 mg Rectal Q4H PRN Romana Juniper A, MD      . sodium chloride 0.9 % 1,000 mL with thiamine 438 mg, folic acid 1 mg, multivitamins adult 10 mL infusion   Intravenous Continuous Clovis Riley, MD         EXAM:  LMP 01/09/2020   There is no height or weight on file to calculate BMI.  GENERAL: vitals reviewed and listed above, alert, oriented, appears well hydrated and in no acute distress HEENT: atraumatic, conjunctiva  clear, no obvious abnormalities on inspection of external nose and ears OP : no lesion edema or exudate  NECK: no obvious masses on inspection palpation  LUNGS: clear to auscultation bilaterally, no wheezes, rales or rhonchi, good air movement CV: HRRR, no clubbing cyanosis or  peripheral edema nl cap refill  MS: moves all extremities without noticeable focal  abnormality PSYCH: pleasant and cooperative, no obvious depression or anxiety Lab Results  Component Value Date   WBC 4.7 01/29/2020   HGB 13.3 01/29/2020   HCT 41.0 01/29/2020   PLT 49 (L) 01/29/2020   GLUCOSE 112 (H) 01/29/2020   CHOL 184 03/11/2019   TRIG 99.0 03/11/2019   HDL 41.80 03/11/2019   LDLCALC 122 (H) 03/11/2019   ALT 93 (H) 01/27/2020   AST  66 (H) 01/27/2020   NA 139 01/29/2020   K 4.0 01/29/2020   CL 104 01/29/2020   CREATININE 1.07 (H) 01/29/2020   BUN 9 01/29/2020   CO2 27 01/29/2020   TSH 1.87 09/27/2019   INR 1.1 01/19/2019   HGBA1C 5.2 03/11/2019   BP Readings from Last 3 Encounters:  01/29/20 100/61  01/14/20 (!) 102/58  09/29/19 108/64    ASSESSMENT AND PLAN:  Discussed the following assessment and plan:  No diagnosis found.  -Patient advised to return or notify health care team  if  new concerns arise.  There are no Patient Instructions on file for this visit.   Standley Brooking. Shai Rasmussen M.D.

## 2020-02-06 NOTE — Progress Notes (Signed)
Patient Care Team: Panosh, Standley Brooking, MD as PCP - General (Internal Medicine) Neldon Mc, Donnamarie Poag, MD as Consulting Physician (Allergy and Immunology) Adrian Prows, MD as Consulting Physician (Cardiology) Elayne Snare, MD as Consulting Physician (Endocrinology) Pyrtle, [name], MD as Consulting Physician (Gastroenterology) Neldon Mc, Donnamarie Poag, MD as Consulting Physician (Allergy and Immunology) Alfonzo Feller, RN as [place] Management  DIAGNOSIS:    ICD-10-CM   1. Thrombocytopenia ([place])  D69.6 CBC with Differential ([place] Only)    CHIEF COMPLIANT: Follow-up of thrombocytopenia and splenomegaly  INTERVAL HISTORY: Linda Becker is a 29 y.o. with above-mentioned history of thrombocytopenia and splenomegaly. Labs on 01/29/20 showed platelets 49, on 01/28/20 platelets 53, and on 01/27/20 platelets 63. CT abdomen/pelvis on 01/27/20 showed cirrhotic morphology of the liver with sequela of portal hypertension including splenomegaly, esophageal varices, and multiple upper abdominal collateral vessels. She presents to the clinic today for follow-up.   ALLERGIES:  is allergic to progesterone and penicillins.  MEDICATIONS:  Current Outpatient Medications  Medication Sig Dispense Refill  . albuterol (PROVENTIL) (2.5 MG/3ML) 0.083% nebulizer solution Take 3 mLs (2.5 mg total) by nebulization every 4 (four) hours as needed for wheezing or shortness of breath.    Marland Kitchen albuterol (VENTOLIN HFA) 108 (90 Base) MCG/ACT inhaler Can inhale two puffs every four to six hours as needed for cough or wheeze. (Patient taking differently: Inhale 2 puffs into the lungs every 6 (six) hours as needed for wheezing or shortness of breath. Can inhale two puffs every four to six hours as needed for cough or wheeze.) 18 g 1  . Ascorbic Acid (VITAMIN C WITH ROSE HIPS) 500 MG tablet Take 500 mg by mouth 2 (two) times daily.    . Beclomethasone Dipropionate (QNASL) 80 MCG/ACT AERS INSTILL 1 SPRAY IN EACH NOSTRIL  ONCE DAILY AS DIRECTED (Patient taking differently: Place 1 spray into the nose daily. INSTILL 1 SPRAY IN EACH NOSTRIL ONCE DAILY AS DIRECTED) 31.8 g 1  . calcium carbonate (OSCAL) 1500 (600 Ca) MG TABS tablet Take 600 mg of elemental calcium by mouth 2 (two) times daily with a meal.    . cetirizine (ZYRTEC) 5 MG tablet Take 5 mg by mouth 2 (two) times daily.    . dupilumab (DUPIXENT) 300 MG/2ML prefilled syringe Inject 300 mg into the skin every 14 (fourteen) days. 4 mL 11  . lamoTRIgine (LAMICTAL) 150 MG tablet Take 150 mg by mouth daily.    Marland Kitchen levothyroxine (SYNTHROID) 88 MCG tablet Take 1 tablet (88 mcg total) by mouth daily before breakfast.    . montelukast (SINGULAIR) 10 MG tablet Take 1 tablet (10 mg total) by mouth daily. 90 tablet 1  . Olopatadine HCl 0.2 % SOLN Can use one drop in each eye once daily if needed. (Patient taking differently: Place 1 drop into both eyes daily as needed (allergies). Can use one drop in each eye once daily if needed.) 7.5 mL 1  . pantoprazole (PROTONIX) 40 MG tablet Take 1 tablet (40 mg total) by mouth 2 (two) times daily before a meal. 60 tablet 0  . Prenatal Vit-Fe Fumarate-FA (MULTIVITAMIN-PRENATAL) 27-0.8 MG TABS tablet Take 1 tablet by mouth 2 (two) times daily.    . promethazine (PHENERGAN) 25 MG tablet Take 1 tablet (25 mg total) by mouth every 6 (six) hours as needed for nausea. 30 tablet 6  . SYMBICORT 160-4.5 MCG/ACT inhaler INHALE 2 PUFFS BY MOUTH TWICE A DAY TO PREVENT COUGH OR WHEEZE. RINSE, GARGLE,  AND SPIT AFTER USE. 30.6 g 1  . vitamin E 400 UNIT capsule Take 400 Units by mouth 2 (two) times daily.     No current facility-administered medications for this visit.   Facility-Administered Medications Ordered in Other Visits  Medication Dose Route Frequency Provider Last Rate Last Admin  . promethazine (PHENERGAN) tablet 12.5-25 mg  12.5-25 mg Oral Q4H PRN Clovis Riley, MD       Or  . promethazine (PHENERGAN) suppository 12.5-25 mg  12.5-25  mg Rectal Q4H PRN Romana Juniper A, MD      . sodium chloride 0.9 % 1,000 mL with thiamine 741 mg, folic acid 1 mg, multivitamins adult 10 mL infusion   Intravenous Continuous Clovis Riley, MD        PHYSICAL EXAMINATION: ECOG PERFORMANCE STATUS: 1 - Symptomatic but completely ambulatory  Vitals:   02/07/20 1430  BP: (!) 96/59  Pulse: (!) 103  Resp: 18  Temp: 98 F (36.7 C)  SpO2: 99%   Filed Weights   02/07/20 1430  Weight: 281 lb 4.8 oz (127.6 kg)    LABORATORY DATA:  I have reviewed the data as listed CMP Latest Ref Rng & Units 02/07/2020 01/29/2020 01/28/2020  Glucose 70 - 99 mg/dL 112(H) 112(H) 135(H)  BUN 6 - 20 mg/dL 9 9 8   Creatinine 0.44 - 1.00 mg/dL 0.84 1.07(H) 1.03(H)  Sodium 135 - 145 mmol/L 140 139 140  Potassium 3.5 - 5.1 mmol/L 4.4 4.0 4.4  Chloride 98 - 111 mmol/L 111 104 106  CO2 22 - 32 mmol/L 23 27 27   Calcium 8.9 - 10.3 mg/dL 8.9 8.7(L) 8.8(L)  Total Protein 6.5 - 8.1 g/dL 5.9(L) - -  Total Bilirubin 0.3 - 1.2 mg/dL 0.3 - -  Alkaline Phos 38 - 126 U/L 129(H) - -  AST 15 - 41 U/L 31 - -  ALT 0 - 44 U/L 47(H) - -    Lab Results  Component Value Date   WBC 5.1 02/07/2020   HGB 13.0 02/07/2020   HCT 38.8 02/07/2020   MCV 94.9 02/07/2020   PLT 63 (L) 02/07/2020   NEUTROABS 2.9 02/07/2020    ASSESSMENT & PLAN:  Thrombocytopenia ([place]) Lab review: 02/04/2018: Platelets 135 07/27/2018: Platelet count 102 01/28/2020: Platelet count 53 02/07/2020: Platelet count 63  Recent hospitalization for hematemesis due to variceal bleed.  She tells me that she gets frequent nausea because of the gastric bypass surgery that she had undergone in January 2021.  When she does not take nausea medication she feels extremely nauseated and she starts throwing up and the gastric viruses tend to bleed. I sent a prescription for Phenergan.  Differential diagnosis: Splenomegaly related to cirrhosis of the liver Previous work-up was negative hepatitis B and C.  Hepatitis  A showed seropositivity  I discussed with her that our plan is watchful monitoring. Patient informed that she wants to get pregnant.  If she does get pregnant then we will have to watch even more closely and consider treating her with platelet transfusions at the time of delivery. Continue for watchful monitoring.  Return to clinic in 6 months with labs and follow-up.    Orders Placed This Encounter  Procedures  . CBC with Differential (Cancer Center Only)    Standing Status:   Future    Standing Expiration Date:   02/06/2021   The patient has a good understanding of the overall plan. she agrees with it. she will call with any problems that may develop  before the next visit here.  Total time spent: 30 mins including face to face time and time spent for planning, charting and coordination of care  Nicholas Lose, MD 02/07/2020  I, Cloyde Reams Dorshimer, am acting as scribe for Dr. Nicholas Lose.  I have reviewed the above documentation for accuracy and completeness, and I agree with the above.

## 2020-02-07 ENCOUNTER — Inpatient Hospital Stay: Payer: 59 | Admitting: Internal Medicine

## 2020-02-07 ENCOUNTER — Inpatient Hospital Stay: Payer: 59 | Attending: Hematology and Oncology

## 2020-02-07 ENCOUNTER — Other Ambulatory Visit: Payer: Self-pay

## 2020-02-07 ENCOUNTER — Inpatient Hospital Stay (HOSPITAL_BASED_OUTPATIENT_CLINIC_OR_DEPARTMENT_OTHER): Payer: 59 | Admitting: Hematology and Oncology

## 2020-02-07 DIAGNOSIS — D696 Thrombocytopenia, unspecified: Secondary | ICD-10-CM | POA: Diagnosis not present

## 2020-02-07 DIAGNOSIS — R110 Nausea: Secondary | ICD-10-CM | POA: Insufficient documentation

## 2020-02-07 DIAGNOSIS — Z9884 Bariatric surgery status: Secondary | ICD-10-CM | POA: Insufficient documentation

## 2020-02-07 DIAGNOSIS — K7469 Other cirrhosis of liver: Secondary | ICD-10-CM | POA: Diagnosis not present

## 2020-02-07 DIAGNOSIS — K7581 Nonalcoholic steatohepatitis (NASH): Secondary | ICD-10-CM | POA: Diagnosis not present

## 2020-02-07 LAB — CBC WITH DIFFERENTIAL (CANCER CENTER ONLY)
Abs Immature Granulocytes: 0.01 10*3/uL (ref 0.00–0.07)
Basophils Absolute: 0 10*3/uL (ref 0.0–0.1)
Basophils Relative: 0 %
Eosinophils Absolute: 0.2 10*3/uL (ref 0.0–0.5)
Eosinophils Relative: 3 %
HCT: 38.8 % (ref 36.0–46.0)
Hemoglobin: 13 g/dL (ref 12.0–15.0)
Immature Granulocytes: 0 %
Lymphocytes Relative: 33 %
Lymphs Abs: 1.7 10*3/uL (ref 0.7–4.0)
MCH: 31.8 pg (ref 26.0–34.0)
MCHC: 33.5 g/dL (ref 30.0–36.0)
MCV: 94.9 fL (ref 80.0–100.0)
Monocytes Absolute: 0.3 10*3/uL (ref 0.1–1.0)
Monocytes Relative: 7 %
Neutro Abs: 2.9 10*3/uL (ref 1.7–7.7)
Neutrophils Relative %: 57 %
Platelet Count: 63 10*3/uL — ABNORMAL LOW (ref 150–400)
RBC: 4.09 MIL/uL (ref 3.87–5.11)
RDW: 12.8 % (ref 11.5–15.5)
WBC Count: 5.1 10*3/uL (ref 4.0–10.5)
nRBC: 0 % (ref 0.0–0.2)

## 2020-02-07 LAB — CMP (CANCER CENTER ONLY)
ALT: 47 U/L — ABNORMAL HIGH (ref 0–44)
AST: 31 U/L (ref 15–41)
Albumin: 3.4 g/dL — ABNORMAL LOW (ref 3.5–5.0)
Alkaline Phosphatase: 129 U/L — ABNORMAL HIGH (ref 38–126)
Anion gap: 6 (ref 5–15)
BUN: 9 mg/dL (ref 6–20)
CO2: 23 mmol/L (ref 22–32)
Calcium: 8.9 mg/dL (ref 8.9–10.3)
Chloride: 111 mmol/L (ref 98–111)
Creatinine: 0.84 mg/dL (ref 0.44–1.00)
GFR, Est AFR Am: >60 mL/min (ref >60)
GFR, Estimated: >60 mL/min (ref >60)
Glucose, Bld: 112 mg/dL — ABNORMAL HIGH (ref 70–99)
Potassium: 4.4 mmol/L (ref 3.5–5.1)
Sodium: 140 mmol/L (ref 135–145)
Total Bilirubin: 0.3 mg/dL (ref 0.3–1.2)
Total Protein: 5.9 g/dL — ABNORMAL LOW (ref 6.5–8.1)

## 2020-02-07 MED ORDER — PROMETHAZINE HCL 25 MG PO TABS: 25.0000 mg | ORAL_TABLET | Freq: Four times a day (QID) | ORAL | 6 refills | Status: DC | PRN

## 2020-02-07 MED FILL — PROMETHAZINE 25 MG TABLET: 25 | 7 days supply | Qty: 30 | Fill #0

## 2020-02-07 NOTE — Assessment & Plan Note (Signed)
Lab review: 02/04/2018: Platelets 135 07/27/2018: Platelet count 102 01/28/2020: Platelet count 53  Differential diagnosis: Splenomegaly related to cirrhosis of the liver Previous work-up was negative hepatitis B and C.  Hepatitis A showed seropositivity Discussed with her the differential between splenomegaly and ITP. Continue for watchful monitoring.

## 2020-02-08 ENCOUNTER — Other Ambulatory Visit: Payer: Self-pay | Admitting: *Deleted

## 2020-02-08 NOTE — Patient Outreach (Signed)
[place] [facility]) Care Management  02/08/2020  [name] Jul 22, 1990 [id number]   Transition of care call Referral received: 01/30/20 Initial outreach attempt: 01/30/20 Insurance: [place] unsuccessful telephone call to patient's preferred contact number in order to complete post hospital discharge transition of care assessment; no answer, left HIPAA compliant message requesting return call.   Objective: Per the electronic medical record,Linda Hatcherwas hospitalized at [facility] 8/13-8/15/21 for Hematemesis, idiopathic esophageal varices with bleeding. Comorbidities include: Asthma , hypothyroidism, Cirrhosis of liver,Portal Hypertension ,Migraine, Bipolar,Laproscopic gastric sleeve resection. She was discharged to home on 8/15/21without the need for home health services or durable medical equipment per the discharge summary.  Plan: If no return call from patient, will close case to [place] Management services in 10 business days after initial post hospital discharge outreach, on 01/30/20.  [name], RN, BSN  [place] Management Coordinator  [phone]- Mobile [phone]- Toll Free Main Office

## 2020-02-10 ENCOUNTER — Other Ambulatory Visit: Payer: Self-pay | Admitting: *Deleted

## 2020-02-10 NOTE — Patient Outreach (Signed)
[facility]) Care Management  02/10/2020  Linda Becker [birth year] [id number]   Transition of care /Case Closure Unsuccessful outreach    Referral received:01/30/20 Initial outreach:01/30/20 Insurance: [employer]   Unable to complete post hospital discharge transition of care assessment. No return call form patient after 3 call attempts and no response to request to contact RN Care Coordinator in unsuccessful outreach letter mailed to home on 01/30/20.  Objective: Per the electronic medical record,Linda Hatcherwas hospitalized at [facility] 8/13-8/15/21 for Hematemesis, idiopathic esophageal varices with bleeding. Comorbidities include: Asthma , hypothyroidism, Cirrhosis of liver,Portal Hypertension ,Migraine, Bipolar,Laproscopic gastric sleeve resection. She was discharged to home on 8/15/21without the need for home health services or durable medical equipment per the discharge summary.   Plan Case closed to [facility] [employer] as it has been 10 days since initial post discharge outreach attempt.   [name], RN, BSN  [place] Management Coordinator  [phone]- Mobile [phone]- Toll Free Main Office

## 2020-02-13 ENCOUNTER — Other Ambulatory Visit: Payer: Self-pay | Admitting: *Deleted

## 2020-02-13 ENCOUNTER — Other Ambulatory Visit: Payer: Self-pay | Admitting: Allergy and Immunology

## 2020-02-13 DIAGNOSIS — K7460 Unspecified cirrhosis of liver: Secondary | ICD-10-CM

## 2020-02-13 DIAGNOSIS — I8501 Esophageal varices with bleeding: Secondary | ICD-10-CM

## 2020-02-13 MED FILL — ALBUTEROL SULFATE HFA 108 (: 108 (90 BAS | 16 days supply | Qty: 18 | Fill #0

## 2020-02-13 MED FILL — LORazepam 0.5 MG TABS: 0.5 | 30 days supply | Qty: 45 | Fill #0

## 2020-02-16 DIAGNOSIS — F5081 Binge eating disorder: Secondary | ICD-10-CM | POA: Diagnosis not present

## 2020-02-16 DIAGNOSIS — F5104 Psychophysiologic insomnia: Secondary | ICD-10-CM | POA: Diagnosis not present

## 2020-02-16 DIAGNOSIS — F4310 Post-traumatic stress disorder, unspecified: Secondary | ICD-10-CM | POA: Diagnosis not present

## 2020-02-16 DIAGNOSIS — F429 Obsessive-compulsive disorder, unspecified: Secondary | ICD-10-CM | POA: Diagnosis not present

## 2020-02-16 DIAGNOSIS — F319 Bipolar disorder, unspecified: Secondary | ICD-10-CM | POA: Diagnosis not present

## 2020-02-21 DIAGNOSIS — Z23 Encounter for immunization: Secondary | ICD-10-CM | POA: Diagnosis not present

## 2020-02-23 DIAGNOSIS — Z8759 Personal history of other complications of pregnancy, childbirth and the puerperium: Secondary | ICD-10-CM | POA: Diagnosis not present

## 2020-02-23 DIAGNOSIS — D696 Thrombocytopenia, unspecified: Secondary | ICD-10-CM | POA: Diagnosis not present

## 2020-02-23 DIAGNOSIS — Z8719 Personal history of other diseases of the digestive system: Secondary | ICD-10-CM | POA: Diagnosis not present

## 2020-02-23 DIAGNOSIS — Z9884 Bariatric surgery status: Secondary | ICD-10-CM | POA: Diagnosis not present

## 2020-02-23 DIAGNOSIS — K769 Liver disease, unspecified: Secondary | ICD-10-CM | POA: Diagnosis not present

## 2020-02-24 ENCOUNTER — Ambulatory Visit: Payer: 59 | Admitting: Internal Medicine

## 2020-02-24 ENCOUNTER — Encounter: Payer: Self-pay | Admitting: Internal Medicine

## 2020-02-24 ENCOUNTER — Other Ambulatory Visit (INDEPENDENT_AMBULATORY_CARE_PROVIDER_SITE_OTHER): Payer: 59

## 2020-02-24 ENCOUNTER — Other Ambulatory Visit: Payer: Self-pay | Admitting: Internal Medicine

## 2020-02-24 VITALS — BP 108/72 | HR 108 | Ht 65.5 in | Wt 274.0 lb

## 2020-02-24 DIAGNOSIS — I8511 Secondary esophageal varices with bleeding: Secondary | ICD-10-CM | POA: Diagnosis not present

## 2020-02-24 DIAGNOSIS — D696 Thrombocytopenia, unspecified: Secondary | ICD-10-CM | POA: Diagnosis not present

## 2020-02-24 DIAGNOSIS — K7581 Nonalcoholic steatohepatitis (NASH): Secondary | ICD-10-CM | POA: Diagnosis not present

## 2020-02-24 DIAGNOSIS — R1010 Upper abdominal pain, unspecified: Secondary | ICD-10-CM

## 2020-02-24 DIAGNOSIS — K7460 Unspecified cirrhosis of liver: Secondary | ICD-10-CM

## 2020-02-24 DIAGNOSIS — K766 Portal hypertension: Secondary | ICD-10-CM | POA: Diagnosis not present

## 2020-02-24 DIAGNOSIS — R935 Abnormal findings on diagnostic imaging of other abdominal regions, including retroperitoneum: Secondary | ICD-10-CM

## 2020-02-24 DIAGNOSIS — Z23 Encounter for immunization: Secondary | ICD-10-CM | POA: Diagnosis not present

## 2020-02-24 LAB — COMPREHENSIVE METABOLIC PANEL
ALT: 31 U/L (ref 0–35)
AST: 26 U/L (ref 0–37)
Albumin: 4.2 g/dL (ref 3.5–5.2)
Alkaline Phosphatase: 98 U/L (ref 39–117)
BUN: 8 mg/dL (ref 6–23)
CO2: 27 mEq/L (ref 19–32)
Calcium: 8.9 mg/dL (ref 8.4–10.5)
Chloride: 109 mEq/L (ref 96–112)
Creatinine, Ser: 0.93 mg/dL (ref 0.40–1.20)
GFR: 72.16 mL/min (ref >60.00)
Glucose, Bld: 101 mg/dL — ABNORMAL HIGH (ref 70–99)
Potassium: 4.1 mEq/L (ref 3.5–5.1)
Sodium: 142 mEq/L (ref 135–145)
Total Bilirubin: 0.4 mg/dL (ref 0.2–1.2)
Total Protein: 6.7 g/dL (ref 6.0–8.3)

## 2020-02-24 LAB — CBC WITH DIFFERENTIAL/PLATELET
Basophils Absolute: 0 10*3/uL (ref 0.0–0.1)
Basophils Relative: 0.6 % (ref 0.0–3.0)
Eosinophils Absolute: 0.2 10*3/uL (ref 0.0–0.7)
Eosinophils Relative: 2.9 % (ref 0.0–5.0)
HCT: 40.2 % (ref 36.0–46.0)
Hemoglobin: 13.6 g/dL (ref 12.0–15.0)
Lymphocytes Relative: 27 % (ref 12.0–46.0)
Lymphs Abs: 1.6 10*3/uL (ref 0.7–4.0)
MCHC: 33.8 g/dL (ref 30.0–36.0)
MCV: 94.5 fl (ref 78.0–100.0)
Monocytes Absolute: 0.4 10*3/uL (ref 0.1–1.0)
Monocytes Relative: 6.2 % (ref 3.0–12.0)
Neutro Abs: 3.7 10*3/uL (ref 1.4–7.7)
Neutrophils Relative %: 63.3 % (ref 43.0–77.0)
Platelets: 69 10*3/uL — ABNORMAL LOW (ref 150.0–400.0)
RBC: 4.25 Mil/uL (ref 3.87–5.11)
RDW: 14.2 % (ref 11.5–15.5)
WBC: 5.8 10*3/uL (ref 4.0–10.5)

## 2020-02-24 LAB — PROTIME-INR
INR: 1.2 ratio — ABNORMAL HIGH (ref 0.8–1.0)
Prothrombin Time: 12.9 s (ref 9.6–13.1)

## 2020-02-24 MED ORDER — PROMETHAZINE HCL 25 MG PO TABS
25.0000 mg | ORAL_TABLET | Freq: Four times a day (QID) | ORAL | 2 refills | Status: DC | PRN
Start: 2020-02-24 — End: 2020-05-01

## 2020-02-24 MED ORDER — ONDANSETRON 8 MG PO TBDP: 8.0000 mg | ORAL_TABLET | Freq: Three times a day (TID) | ORAL | 1 refills | Status: DC | PRN

## 2020-02-24 MED FILL — PROMETHAZINE 25 MG TABLET: 25 | 7 days supply | Qty: 30 | Fill #0

## 2020-02-24 MED FILL — ONDANSETRON ODT 8 MG TABLET: 8 | 10 days supply | Qty: 30 | Fill #0

## 2020-02-24 NOTE — H&P (View-Only) (Signed)
Subjective:    Patient ID: Linda Becker, female    DOB: [birth year], 29 y.o.   MRN: [id number]  HPI Linda Becker is a 29 year old female with a past medical history of biopsy-proven NASH cirrhosis with portal hypertension complicated by esophageal varices with recent hemorrhage, thrombocytopenia, obesity status post gastric sleeve in January 2021, history of prior sessile serrated polyp at colonoscopy in 2020, who is here for follow-up.  She is here alone today.  She was admitted to [facility] on 01/27/2020 with hematemesis.  She underwent upper endoscopy with Dr. Rush Landmark on the same day with esophageal variceal band ligation.  She had grade 2 and grade 3 varices with stigmata in the distal esophagus.  4 bands were successfully placed.  There was mild portal hypertensive gastropathy in the proximal stomach.  There was no evidence of gastric varices.  She is also followed with Roosevelt Locks, NP with [facility] and last saw this office on 01/31/2020.  She reports that she continues to have bandlike upper abdominal pain which changes a little with eating.  She is having near constant nausea.  Nausea has been a big issue for her after her gastric sleeve procedure in January.  She has lost about 70 pounds since this surgery.  She has had 3 episodes of vomiting since hospital discharge all nonbloody.  She is having frequent loose stools but no melena or rectal bleeding.  She is using Tylenol 1 g twice daily for the upper pain which does not take it away completely.  She is alternating Phenergan every 6 hours with Zofran every 8 hours though she ran out of Zofran about a week ago.  She wonders if the vomiting resulted from having run out of Zofran.  She still wishes to get pregnant but she is not actively trying as she has been counseled against pregnancy in the setting of her liver disease with portal hypertension.  She is also seen Dr. Sonny Dandy for her thrombocytopenia.  She is interested in  possibly a second opinion at a different liver transplant center.  Her mother has decompensated NASH cirrhosis and is entering palliative care.  She has had refractory encephalopathy and ascites.  She is getting frequent paracenteses.  Review of Systems As per HPI, otherwise negative  Current Medications, Allergies, Past Medical History, Past Surgical History, Family History and Social History were reviewed in [employer] record.     Objective:   Physical Exam BP 108/72   Pulse (!) 108   Ht 5' 5.5" (1.664 m)   Wt 274 lb (124.3 kg)   LMP 02/07/2020 (Exact Date)   SpO2 98%   BMI 44.90 kg/m  Gen: awake, alert, NAD HEENT: anicteric CV: RRR, no mrg Pulm: CTA b/l Abd: soft, obese, upper abdominal tenderness without rebound or guarding, no appreciable ascites, positive bowel sounds throughout Ext: no c/c/e Neuro: nonfocal, no asterixis  ULTRASOUND ABDOMEN LIMITED RIGHT UPPER QUADRANT   COMPARISON:  None.   FINDINGS: Gallbladder:   No gallstones or wall thickening visualized. No sonographic Murphy sign noted by sonographer. Maximal wall thickness is 3 mm, within normal limits.   Common bile duct:   Diameter: 3 mm, within normal limits   Liver:   No focal lesion identified. Within normal limits in parenchymal echogenicity. Portal vein is patent on color Doppler imaging with normal direction of blood flow towards the liver.   Other: None.   IMPRESSION: Negative right upper quadrant ultrasound.     Electronically Signed  By: San Morelle M.D.   On: 01/27/2020 05:09   CT ABDOMEN AND PELVIS WITH CONTRAST   TECHNIQUE: Multidetector CT imaging of the abdomen and pelvis was performed using the standard protocol following bolus administration of intravenous contrast.   CONTRAST:  165m OMNIPAQUE IOHEXOL 300 MG/ML  SOLN   COMPARISON:  Ultrasound 08/02/2019, CT 07/14/2019, 02/25/2018   FINDINGS: Lower chest: Lung bases demonstrate no  acute consolidation or pleural effusion. Cardiac size within normal limits.   Hepatobiliary: Cirrhotic morphology of the liver. No focal hepatic abnormality. No biliary dilatation. Distended gallbladder with hazy edema at the gallbladder fossa. Generalized edema at the porta hepatis.   Pancreas: Questionable mild peripancreatic inflammatory change about the proximal pancreas.   Spleen: Markedly enlarged, measuring up to 20 cm AP.   Adrenals/Urinary Tract: Adrenal glands are unremarkable. Kidneys are normal, without renal calculi, focal lesion, or hydronephrosis. Bladder is unremarkable.   Stomach/Bowel: Status post gastric sleeve surgery. No evidence for a bowel obstruction or bowel wall thickening. Negative appendix.   Vascular/Lymphatic: Recanalized paraumbilical vein. Multiple distal esophageal varices. Tortuous collateral vessels at the splenic hilum. Multiple mildly enlarged porta hepatis lymph nodes measuring up to 10 mm. Gastrohepatic lymph node measuring 17 mm. Multiple right lower quadrant lymph nodes.   Reproductive: Uterus unremarkable. Benign-appearing cyst in the left adnexa   Other: No free air. Small amount of free fluid in the pelvis. Hazy appearance of the mesentery.   Musculoskeletal: No acute or significant osseous findings.   IMPRESSION: 1. Cirrhotic morphology of the liver with sequela of portal hypertension including splenomegaly, esophageal varices, and multiple upper abdominal collateral vessels. 2. Distended gallbladder with hazy edema at the gallbladder fossa, raising concern for acute cholecystitis. Suggest correlation with ultrasound. 3. Questionable mild peripancreatic inflammatory change about the proximal pancreas versus edema from inflammatory process at the gallbladder. Correlate with enzymes. 4. Small amount of free fluid in the pelvis.     Electronically Signed   By: KDonavan FoilM.D.   On: 01/27/2020 03:49   CBC    Component  Value Date/Time   WBC 5.8 02/24/2020 1641   RBC 4.25 02/24/2020 1641   HGB 13.6 02/24/2020 1641   HGB 13.0 02/07/2020 1412   HGB 14.7 01/14/2020 1309   HCT 40.2 02/24/2020 1641   HCT 47.1 (H) 01/14/2020 1309   PLT 69.0 (L) 02/24/2020 1641   PLT 63 (L) 02/07/2020 1412   PLT 64 (L) 01/14/2020 1309   MCV 94.5 02/24/2020 1641   MCV 100.9 (H) 01/14/2020 1309   MCH 31.8 02/07/2020 1412   MCHC 33.8 02/24/2020 1641   RDW 14.2 02/24/2020 1641   RDW 13.5 01/14/2020 1309   LYMPHSABS 1.6 02/24/2020 1641   LYMPHSABS 1.3 01/14/2020 1309   MONOABS 0.4 02/24/2020 1641   EOSABS 0.2 02/24/2020 1641   EOSABS 0.1 01/14/2020 1309   BASOSABS 0.0 02/24/2020 1641   BASOSABS 0.0 01/14/2020 1309   CMP     Component Value Date/Time   NA 142 02/24/2020 1641   NA 145 01/14/2020 1309   K 4.1 02/24/2020 1641   CL 109 02/24/2020 1641   CO2 27 02/24/2020 1641   GLUCOSE 101 (H) 02/24/2020 1641   BUN 8 02/24/2020 1641   BUN 12 01/14/2020 1309   CREATININE 0.93 02/24/2020 1641   CREATININE 0.84 02/07/2020 1412   CALCIUM 8.9 02/24/2020 1641   PROT 6.7 02/24/2020 1641   PROT 6.2 (L) 01/14/2020 1309   ALBUMIN 4.2 02/24/2020 1641   ALBUMIN 3.9 01/14/2020  1309   AST 26 02/24/2020 1641   AST 31 02/07/2020 1412   ALT 31 02/24/2020 1641   ALT 47 (H) 02/07/2020 1412   ALKPHOS 98 02/24/2020 1641   BILITOT 0.4 02/24/2020 1641   BILITOT 0.3 02/07/2020 1412   GFRNONAA >60 02/07/2020 1412   GFRAA >60 02/07/2020 1412   Lab Results  Component Value Date   INR 1.2 (H) 02/24/2020   INR 1.1 01/19/2019   INR 1.1 (H) 11/26/2018   MELD today = 8     Assessment & Plan:  29 year old female with a past medical history of biopsy-proven NASH cirrhosis with portal hypertension complicated by esophageal varices with recent hemorrhage, thrombocytopenia, obesity status post gastric sleeve in January 2021, history of prior sessile serrated polyp at colonoscopy in 2020, who is here for follow-up.  1.  Decompensated NASH  cirrhosis/portal hypertension with esophageal varices with recent hemorrhage/thrombocytopenia and splenomegaly --   We discussed her liver disease together today at length.  She has been successful at losing 70 pounds with gastric bypass but continues to have significant portal hypertension.  Her portal hypertension is more severe than her overall liver synthetic function.  Her MELD score is 8 which is relatively low for her degree of portal hypertension.  Her portal hypertension manifestations at present are the esophageal varices and portal gastropathy, the former which bled less than 1 month ago as well as her thrombocytopenia.  She has a very mild coagulopathy with her INR at 1.2 and her liver enzymes are normal with the exception of slightly elevation in serum ALT.  Bilirubin is normal.  Renal function is normal.  She is interested in second liver transplant center opinion and I am going to refer her to Dr. Charlean Sanfilippo at [facility].  I have cautioned her about getting pregnant, as others have as well due to the considerable risk of worsening portal hypertension and very high risk of complications.  She seems understand this and is not actively trying to get pregnant.  We will proceed as follows: --Repeat EGD scheduled on 03/12/2020 for variceal banding protocol --CBC, CMP and INR done today; meld score is 8 --No evidence at present for ascites or encephalopathy --Hepatitis B vaccine series to start today --Low-sodium diet recommended --Hepatoma screening currently up-to-date with recent CT scan, given cross-sectional imaging would repeat CT scan in August 2022 --Referral to see Dr. Damita [facility] at [facility]  2.  Upper abdominal pain/haziness around the gallbladder and pancreatic head --her upper abdominal pain etiology is unclear.  There was some abnormality around the gallbladder and pancreatic head.  I am going to repeat imaging. --MRI abdomen with and without contrast plus MRCP  3.  Nausea --we will continue to  alternate promethazine 25 mg every 6 hours as needed with ondansetron 8 mg every 8 hours as needed. --Continue pantoprazole 40 mg twice daily  4.  History of sessile serrated polyp --2 small sessile serrated polyps removed at colonoscopy in March 2020; repeat colonoscopy recommended March 2025  40 minutes total spent today including patient facing time, coordination of care, reviewing medical history/procedures/pertinent radiology studies, and documentation of the encounter.

## 2020-02-24 NOTE — Progress Notes (Signed)
Subjective:    Patient ID: Linda Becker, female    DOB: [birth year], 29 y.o.   MRN: [id number]  HPI Linda Becker is a 29 year old female with a past medical history of biopsy-proven NASH cirrhosis with portal hypertension complicated by esophageal varices with recent hemorrhage, thrombocytopenia, obesity status post gastric sleeve in January 2021, history of prior sessile serrated polyp at colonoscopy in 2020, who is here for follow-up.  She is here alone today.  She was admitted to [facility] on 01/27/2020 with hematemesis.  She underwent upper endoscopy with Dr. Rush Landmark on the same day with esophageal variceal band ligation.  She had grade 2 and grade 3 varices with stigmata in the distal esophagus.  4 bands were successfully placed.  There was mild portal hypertensive gastropathy in the proximal stomach.  There was no evidence of gastric varices.  She is also followed with Roosevelt Locks, NP with [facility] and last saw this office on 01/31/2020.  She reports that she continues to have bandlike upper abdominal pain which changes a little with eating.  She is having near constant nausea.  Nausea has been a big issue for her after her gastric sleeve procedure in January.  She has lost about 70 pounds since this surgery.  She has had 3 episodes of vomiting since hospital discharge all nonbloody.  She is having frequent loose stools but no melena or rectal bleeding.  She is using Tylenol 1 g twice daily for the upper pain which does not take it away completely.  She is alternating Phenergan every 6 hours with Zofran every 8 hours though she ran out of Zofran about a week ago.  She wonders if the vomiting resulted from having run out of Zofran.  She still wishes to get pregnant but she is not actively trying as she has been counseled against pregnancy in the setting of her liver disease with portal hypertension.  She is also seen Dr. Sonny Dandy for her thrombocytopenia.  She is interested in  possibly a second opinion at a different liver transplant center.  Her mother has decompensated NASH cirrhosis and is entering palliative care.  She has had refractory encephalopathy and ascites.  She is getting frequent paracenteses.  Review of Systems As per HPI, otherwise negative  Current Medications, Allergies, Past Medical History, Past Surgical History, Family History and Social History were reviewed in [employer] record.     Objective:   Physical Exam BP 108/72   Pulse (!) 108   Ht 5' 5.5" (1.664 m)   Wt 274 lb (124.3 kg)   LMP 02/07/2020 (Exact Date)   SpO2 98%   BMI 44.90 kg/m  Gen: awake, alert, NAD HEENT: anicteric CV: RRR, no mrg Pulm: CTA b/l Abd: soft, obese, upper abdominal tenderness without rebound or guarding, no appreciable ascites, positive bowel sounds throughout Ext: no c/c/e Neuro: nonfocal, no asterixis  ULTRASOUND ABDOMEN LIMITED RIGHT UPPER QUADRANT   COMPARISON:  None.   FINDINGS: Gallbladder:   No gallstones or wall thickening visualized. No sonographic Murphy sign noted by sonographer. Maximal wall thickness is 3 mm, within normal limits.   Common bile duct:   Diameter: 3 mm, within normal limits   Liver:   No focal lesion identified. Within normal limits in parenchymal echogenicity. Portal vein is patent on color Doppler imaging with normal direction of blood flow towards the liver.   Other: None.   IMPRESSION: Negative right upper quadrant ultrasound.     Electronically Signed  By: San Morelle M.D.   On: 01/27/2020 05:09   CT ABDOMEN AND PELVIS WITH CONTRAST   TECHNIQUE: Multidetector CT imaging of the abdomen and pelvis was performed using the standard protocol following bolus administration of intravenous contrast.   CONTRAST:  14m OMNIPAQUE IOHEXOL 300 MG/ML  SOLN   COMPARISON:  Ultrasound 08/02/2019, CT 07/14/2019, 02/25/2018   FINDINGS: Lower chest: Lung bases demonstrate no  acute consolidation or pleural effusion. Cardiac size within normal limits.   Hepatobiliary: Cirrhotic morphology of the liver. No focal hepatic abnormality. No biliary dilatation. Distended gallbladder with hazy edema at the gallbladder fossa. Generalized edema at the porta hepatis.   Pancreas: Questionable mild peripancreatic inflammatory change about the proximal pancreas.   Spleen: Markedly enlarged, measuring up to 20 cm AP.   Adrenals/Urinary Tract: Adrenal glands are unremarkable. Kidneys are normal, without renal calculi, focal lesion, or hydronephrosis. Bladder is unremarkable.   Stomach/Bowel: Status post gastric sleeve surgery. No evidence for a bowel obstruction or bowel wall thickening. Negative appendix.   Vascular/Lymphatic: Recanalized paraumbilical vein. Multiple distal esophageal varices. Tortuous collateral vessels at the splenic hilum. Multiple mildly enlarged porta hepatis lymph nodes measuring up to 10 mm. Gastrohepatic lymph node measuring 17 mm. Multiple right lower quadrant lymph nodes.   Reproductive: Uterus unremarkable. Benign-appearing cyst in the left adnexa   Other: No free air. Small amount of free fluid in the pelvis. Hazy appearance of the mesentery.   Musculoskeletal: No acute or significant osseous findings.   IMPRESSION: 1. Cirrhotic morphology of the liver with sequela of portal hypertension including splenomegaly, esophageal varices, and multiple upper abdominal collateral vessels. 2. Distended gallbladder with hazy edema at the gallbladder fossa, raising concern for acute cholecystitis. Suggest correlation with ultrasound. 3. Questionable mild peripancreatic inflammatory change about the proximal pancreas versus edema from inflammatory process at the gallbladder. Correlate with enzymes. 4. Small amount of free fluid in the pelvis.     Electronically Signed   By: KDonavan FoilM.D.   On: 01/27/2020 03:49   CBC    Component  Value Date/Time   WBC 5.8 02/24/2020 1641   RBC 4.25 02/24/2020 1641   HGB 13.6 02/24/2020 1641   HGB 13.0 02/07/2020 1412   HGB 14.7 01/14/2020 1309   HCT 40.2 02/24/2020 1641   HCT 47.1 (H) 01/14/2020 1309   PLT 69.0 (L) 02/24/2020 1641   PLT 63 (L) 02/07/2020 1412   PLT 64 (L) 01/14/2020 1309   MCV 94.5 02/24/2020 1641   MCV 100.9 (H) 01/14/2020 1309   MCH 31.8 02/07/2020 1412   MCHC 33.8 02/24/2020 1641   RDW 14.2 02/24/2020 1641   RDW 13.5 01/14/2020 1309   LYMPHSABS 1.6 02/24/2020 1641   LYMPHSABS 1.3 01/14/2020 1309   MONOABS 0.4 02/24/2020 1641   EOSABS 0.2 02/24/2020 1641   EOSABS 0.1 01/14/2020 1309   BASOSABS 0.0 02/24/2020 1641   BASOSABS 0.0 01/14/2020 1309   CMP     Component Value Date/Time   NA 142 02/24/2020 1641   NA 145 01/14/2020 1309   K 4.1 02/24/2020 1641   CL 109 02/24/2020 1641   CO2 27 02/24/2020 1641   GLUCOSE 101 (H) 02/24/2020 1641   BUN 8 02/24/2020 1641   BUN 12 01/14/2020 1309   CREATININE 0.93 02/24/2020 1641   CREATININE 0.84 02/07/2020 1412   CALCIUM 8.9 02/24/2020 1641   PROT 6.7 02/24/2020 1641   PROT 6.2 (L) 01/14/2020 1309   ALBUMIN 4.2 02/24/2020 1641   ALBUMIN 3.9 01/14/2020  1309   AST 26 02/24/2020 1641   AST 31 02/07/2020 1412   ALT 31 02/24/2020 1641   ALT 47 (H) 02/07/2020 1412   ALKPHOS 98 02/24/2020 1641   BILITOT 0.4 02/24/2020 1641   BILITOT 0.3 02/07/2020 1412   GFRNONAA >60 02/07/2020 1412   GFRAA >60 02/07/2020 1412   Lab Results  Component Value Date   INR 1.2 (H) 02/24/2020   INR 1.1 01/19/2019   INR 1.1 (H) 11/26/2018   MELD today = 8     Assessment & Plan:  29 year old female with a past medical history of biopsy-proven NASH cirrhosis with portal hypertension complicated by esophageal varices with recent hemorrhage, thrombocytopenia, obesity status post gastric sleeve in January 2021, history of prior sessile serrated polyp at colonoscopy in 2020, who is here for follow-up.  1.  Decompensated NASH  cirrhosis/portal hypertension with esophageal varices with recent hemorrhage/thrombocytopenia and splenomegaly --   We discussed her liver disease together today at length.  She has been successful at losing 70 pounds with gastric bypass but continues to have significant portal hypertension.  Her portal hypertension is more severe than her overall liver synthetic function.  Her MELD score is 8 which is relatively low for her degree of portal hypertension.  Her portal hypertension manifestations at present are the esophageal varices and portal gastropathy, the former which bled less than 1 month ago as well as her thrombocytopenia.  She has a very mild coagulopathy with her INR at 1.2 and her liver enzymes are normal with the exception of slightly elevation in serum ALT.  Bilirubin is normal.  Renal function is normal.  She is interested in second liver transplant center opinion and I am going to refer her to Dr. Charlean Sanfilippo at [facility].  I have cautioned her about getting pregnant, as others have as well due to the considerable risk of worsening portal hypertension and very high risk of complications.  She seems understand this and is not actively trying to get pregnant.  We will proceed as follows: --Repeat EGD scheduled on 03/12/2020 for variceal banding protocol --CBC, CMP and INR done today; meld score is 8 --No evidence at present for ascites or encephalopathy --Hepatitis B vaccine series to start today --Low-sodium diet recommended --Hepatoma screening currently up-to-date with recent CT scan, given cross-sectional imaging would repeat CT scan in August 2022 --Referral to see Dr. Damita Lack at [facility]  2.  Upper abdominal pain/haziness around the gallbladder and pancreatic head --her upper abdominal pain etiology is unclear.  There was some abnormality around the gallbladder and pancreatic head.  I am going to repeat imaging. --MRI abdomen with and without contrast plus MRCP  3.  Nausea --we will continue to  alternate promethazine 25 mg every 6 hours as needed with ondansetron 8 mg every 8 hours as needed. --Continue pantoprazole 40 mg twice daily  4.  History of sessile serrated polyp --2 small sessile serrated polyps removed at colonoscopy in March 2020; repeat colonoscopy recommended March 2025  40 minutes total spent today including patient facing time, coordination of care, reviewing medical history/procedures/pertinent radiology studies, and documentation of the encounter.

## 2020-02-24 NOTE — Patient Instructions (Addendum)
You have been scheduled for an endoscopy. Please follow written instructions given to you at your visit today. If you use inhalers (even only as needed), please bring them with you on the day of your procedure.  You have been given your first hepatitis b (Heplisav) injection today.  You have been scheduled for your final hepatitis b injection on 03/26/20 at 4:00 pm.  You have been scheduled for an MRI at [facility] on 03/01/20. Your appointment time is 7:00 am. Please arrive 30 minutes prior to your appointment time for registration purposes. Please make certain not to have anything to eat or drink 6 hours prior to your test. In addition, if you have any metal in your body, have a pacemaker or defibrillator, please be sure to let your ordering physician know. This test typically takes 45 minutes to 1 hour to complete. Should you need to reschedule, please call [phone] to do so.  Please follow up with Dr [name] on Tuesday 05/01/20 @ 3:00 pm.  Dennis Bast will be referred to Dr Waldon Reining at [facility]. We will be in touch with you regarding an appointment as soon as one has been made available to [place].  If you are age 53 or older, your body mass index should be between 23-30. Your Body mass index is 44.9 kg/m. If this is out of the aforementioned range listed, please consider follow up with your Primary Care Provider.  If you are age 31 or younger, your body mass index should be between 19-25. Your Body mass index is 44.9 kg/m. If this is out of the aformentioned range listed, please consider follow up with your Primary Care Provider.   Due to recent changes in healthcare laws, you may see the results of your imaging and laboratory studies on MyChart before your provider has had a chance to review them.  We understand that in some cases there may be results that are confusing or concerning to you. Not all laboratory results come back in the same time frame and the provider may be waiting  for multiple results in order to interpret others.  Please give [place] 48 hours in order for your provider to thoroughly review all the results before contacting the office for clarification of your results.

## 2020-02-26 DIAGNOSIS — Z9884 Bariatric surgery status: Secondary | ICD-10-CM | POA: Insufficient documentation

## 2020-02-27 ENCOUNTER — Other Ambulatory Visit (HOSPITAL_COMMUNITY): Payer: 59

## 2020-02-27 NOTE — Progress Notes (Addendum)
Attempted to obtain medical history via telephone, unable to reach at this time. I left a sms notification to return pre surgical testing department's phone call.

## 2020-02-29 ENCOUNTER — Other Ambulatory Visit (HOSPITAL_COMMUNITY): Payer: Self-pay

## 2020-02-29 DIAGNOSIS — F4310 Post-traumatic stress disorder, unspecified: Secondary | ICD-10-CM | POA: Diagnosis not present

## 2020-02-29 DIAGNOSIS — E7212 Methylenetetrahydrofolate reductase deficiency: Secondary | ICD-10-CM | POA: Diagnosis not present

## 2020-02-29 DIAGNOSIS — F429 Obsessive-compulsive disorder, unspecified: Secondary | ICD-10-CM | POA: Diagnosis not present

## 2020-02-29 DIAGNOSIS — F5081 Binge eating disorder: Secondary | ICD-10-CM | POA: Diagnosis not present

## 2020-02-29 DIAGNOSIS — F319 Bipolar disorder, unspecified: Secondary | ICD-10-CM | POA: Diagnosis not present

## 2020-02-29 MED FILL — GABAPENTIN 300 MG CAPSULE: 300 | 30 days supply | Qty: 120 | Fill #0

## 2020-03-01 ENCOUNTER — Other Ambulatory Visit: Payer: Self-pay | Admitting: Internal Medicine

## 2020-03-01 ENCOUNTER — Telehealth: Payer: Self-pay | Admitting: Internal Medicine

## 2020-03-01 ENCOUNTER — Other Ambulatory Visit: Payer: Self-pay

## 2020-03-01 ENCOUNTER — Ambulatory Visit (HOSPITAL_COMMUNITY)
Admission: RE | Admit: 2020-03-01 | Discharge: 2020-03-01 | Disposition: A | Discharge status: Home / Self Care | Payer: 59 | Source: Ambulatory Visit | Attending: Internal Medicine | Admitting: Internal Medicine

## 2020-03-01 DIAGNOSIS — K7460 Unspecified cirrhosis of liver: Secondary | ICD-10-CM | POA: Diagnosis not present

## 2020-03-01 DIAGNOSIS — R935 Abnormal findings on diagnostic imaging of other abdominal regions, including retroperitoneum: Secondary | ICD-10-CM | POA: Diagnosis not present

## 2020-03-01 DIAGNOSIS — K760 Fatty (change of) liver, not elsewhere classified: Secondary | ICD-10-CM | POA: Diagnosis not present

## 2020-03-01 MED ORDER — GADOBUTROL 1 MMOL/ML IV SOLN
10.0000 mL | Freq: Once | INTRAVENOUS | Status: AC | PRN
  Administered 2020-03-01: 10 mL via INTRAVENOUS

## 2020-03-01 NOTE — Telephone Encounter (Signed)
I have advised patient that I did send 103 page referral over to Dr Vear Clock office on 02/27/20 (Sunday) when I was working. I got a faxed confirmation that records were received. I contacted [facility] and their are no records in the system yet, but I am told it may take 3-5 business days to get things in the system, so it is entirely possible that records are there and just have not been uploaded. I will follow up on this in the near future. Patient verbalizes understanding.

## 2020-03-01 NOTE — Telephone Encounter (Signed)
Pt called [facility] to check on her referral to see Dr. Damita Lack and was told they do not have the referral. Pt had an OV last week and it looks like you were working on this.

## 2020-03-02 ENCOUNTER — Ambulatory Visit: Payer: 59 | Admitting: Professional

## 2020-03-02 MED FILL — LORazepam 0.5 MG TABS: 0.5 | 30 days supply | Qty: 60 | Fill #0

## 2020-03-03 ENCOUNTER — Ambulatory Visit (INDEPENDENT_AMBULATORY_CARE_PROVIDER_SITE_OTHER): Payer: 59 | Admitting: Professional

## 2020-03-03 DIAGNOSIS — F411 Generalized anxiety disorder: Secondary | ICD-10-CM

## 2020-03-05 ENCOUNTER — Telehealth: Payer: Self-pay | Admitting: Internal Medicine

## 2020-03-05 NOTE — Telephone Encounter (Signed)
Pt interested in applying of disability. States with her health she would like to take some time off and try to get her health straightened out. Pt will bring paperwork or have paperwork sent over for Dr. [name] to fill out.

## 2020-03-05 NOTE — Telephone Encounter (Signed)
Noted  

## 2020-03-06 ENCOUNTER — Telehealth (HOSPITAL_COMMUNITY): Payer: Self-pay | Admitting: Psychiatry

## 2020-03-06 NOTE — Telephone Encounter (Signed)
D:  Linda Becker, [facility] referred pt to [facility].  A:  Placed call to orient pt.  According to pt, she is under extreme financial distress.  Strongly encouraged pt to contact her insurance company to verify her benefits. CCA scheduled for 03-13-20(giving pt enough time to verify) and pt is to start on 03-14-20.  Inform Juliann Pulse.

## 2020-03-08 ENCOUNTER — Other Ambulatory Visit (HOSPITAL_COMMUNITY)
Admission: RE | Admit: 2020-03-08 | Discharge: 2020-03-08 | Disposition: A | Discharge status: Home / Self Care | Payer: 59 | Source: Ambulatory Visit | Attending: Internal Medicine | Admitting: Internal Medicine

## 2020-03-08 ENCOUNTER — Telehealth: Payer: Self-pay | Admitting: Internal Medicine

## 2020-03-08 DIAGNOSIS — Z01812 Encounter for preprocedural laboratory examination: Secondary | ICD-10-CM | POA: Diagnosis not present

## 2020-03-08 DIAGNOSIS — Z20822 Contact with and (suspected) exposure to covid-19: Secondary | ICD-10-CM | POA: Diagnosis not present

## 2020-03-08 LAB — SARS CORONAVIRUS 2 (TAT 6-24 HRS): SARS Coronavirus 2: NEGATIVE

## 2020-03-08 NOTE — Telephone Encounter (Signed)
I have called [facility] to follow up on referral for patient. Unfortunately, she still has not been scheduled for appointment. I am told that the referral notes have been faxed to the appropriate person as of this afternoon but that I need to fill out an additional referral form for gastroenterology. I have completed this and faxed to [phone] at the request of Hendricks Milo, the referral supervisor at [facility]. I will call back within the next several days for follow up.

## 2020-03-08 NOTE — Telephone Encounter (Signed)
Patient called wanting to follow up requesting to speak with a nurse

## 2020-03-08 NOTE — Telephone Encounter (Signed)
Trenton Gammon, front desk coordinator has contacted patient to advise that we have received disability forms and that she needs to come by our office to sign ROI as well as intake information form before we can begin working on the disability form.

## 2020-03-08 NOTE — Telephone Encounter (Signed)
Pt calling back to check on the status of the disability forms that she brought to the office. States she thinks the forms needs to be faxed back. She also asked about the cost for forms, let her know the fee is collected when the forms are picked up. Pt wants to know if she needs to do anything further regarding the forms.

## 2020-03-09 ENCOUNTER — Telehealth: Payer: Self-pay | Admitting: *Deleted

## 2020-03-09 MED FILL — ALBUTEROL SULFATE HFA 108 (: 108 (90 BAS | 16 days supply | Qty: 18 | Fill #1

## 2020-03-09 NOTE — Telephone Encounter (Signed)
I have received patient's FMLA forms and patient intake form and am currently working on these forms.

## 2020-03-09 NOTE — Telephone Encounter (Signed)
error 

## 2020-03-09 NOTE — Telephone Encounter (Signed)
Forms have been placed in yellow folder in Dr Vena Rua inbox on his desk to be signed.

## 2020-03-09 NOTE — Progress Notes (Signed)
Pre op call done for endo procedure Monday 03/12/20. Patient states she will quarantine over weekend. Patient confirms she has a ride home after procedure. All questions addressed.

## 2020-03-12 ENCOUNTER — Ambulatory Visit (HOSPITAL_COMMUNITY)
Admission: RE | Admit: 2020-03-12 | Discharge: 2020-03-12 | Disposition: A | Discharge status: Home / Self Care | Payer: 59 | Attending: Internal Medicine | Admitting: Internal Medicine

## 2020-03-12 ENCOUNTER — Ambulatory Visit (HOSPITAL_COMMUNITY): Payer: 59 | Admitting: Certified Registered Nurse Anesthetist

## 2020-03-12 ENCOUNTER — Encounter (HOSPITAL_COMMUNITY): Payer: Self-pay | Admitting: Internal Medicine

## 2020-03-12 ENCOUNTER — Other Ambulatory Visit: Payer: Self-pay

## 2020-03-12 ENCOUNTER — Other Ambulatory Visit: Payer: Self-pay | Admitting: Internal Medicine

## 2020-03-12 ENCOUNTER — Encounter (HOSPITAL_COMMUNITY)
Admission: RE | Disposition: A | Discharge status: Home / Self Care | Payer: Self-pay | Source: Home / Self Care | Attending: Internal Medicine

## 2020-03-12 DIAGNOSIS — I10 Essential (primary) hypertension: Secondary | ICD-10-CM | POA: Diagnosis not present

## 2020-03-12 DIAGNOSIS — Z9884 Bariatric surgery status: Secondary | ICD-10-CM | POA: Diagnosis not present

## 2020-03-12 DIAGNOSIS — K766 Portal hypertension: Secondary | ICD-10-CM | POA: Insufficient documentation

## 2020-03-12 DIAGNOSIS — Z6841 Body Mass Index (BMI) 40.0 and over, adult: Secondary | ICD-10-CM | POA: Diagnosis not present

## 2020-03-12 DIAGNOSIS — I8510 Secondary esophageal varices without bleeding: Secondary | ICD-10-CM

## 2020-03-12 DIAGNOSIS — I8500 Esophageal varices without bleeding: Secondary | ICD-10-CM

## 2020-03-12 DIAGNOSIS — D696 Thrombocytopenia, unspecified: Secondary | ICD-10-CM | POA: Diagnosis not present

## 2020-03-12 DIAGNOSIS — K7581 Nonalcoholic steatohepatitis (NASH): Secondary | ICD-10-CM | POA: Insufficient documentation

## 2020-03-12 DIAGNOSIS — I8511 Secondary esophageal varices with bleeding: Secondary | ICD-10-CM | POA: Diagnosis not present

## 2020-03-12 DIAGNOSIS — K7460 Unspecified cirrhosis of liver: Secondary | ICD-10-CM | POA: Insufficient documentation

## 2020-03-12 DIAGNOSIS — K3189 Other diseases of stomach and duodenum: Secondary | ICD-10-CM | POA: Diagnosis not present

## 2020-03-12 DIAGNOSIS — E669 Obesity, unspecified: Secondary | ICD-10-CM | POA: Insufficient documentation

## 2020-03-12 DIAGNOSIS — R161 Splenomegaly, not elsewhere classified: Secondary | ICD-10-CM | POA: Diagnosis not present

## 2020-03-12 HISTORY — PX: ESOPHAGOGASTRODUODENOSCOPY (EGD) WITH PROPOFOL: SHX5813

## 2020-03-12 SURGERY — ESOPHAGOGASTRODUODENOSCOPY (EGD) WITH PROPOFOL
Anesthesia: Monitor Anesthesia Care

## 2020-03-12 MED ORDER — MIDAZOLAM HCL 2 MG/2ML IJ SOLN
INTRAMUSCULAR | Status: AC
  Filled 2020-03-12: qty 2

## 2020-03-12 MED ORDER — PROPOFOL 10 MG/ML IV BOLUS
INTRAVENOUS | Status: DC | PRN
  Administered 2020-03-12: 40 mg via INTRAVENOUS
  Administered 2020-03-12: 20 mg via INTRAVENOUS
  Administered 2020-03-12: 30 mg via INTRAVENOUS

## 2020-03-12 MED ORDER — MIDAZOLAM HCL 5 MG/5ML IJ SOLN
INTRAMUSCULAR | Status: DC | PRN
  Administered 2020-03-12: 2 mg via INTRAVENOUS

## 2020-03-12 MED ORDER — PROPOFOL 500 MG/50ML IV EMUL
INTRAVENOUS | Status: AC
  Filled 2020-03-12: qty 50

## 2020-03-12 MED ORDER — PROPOFOL 10 MG/ML IV BOLUS
INTRAVENOUS | Status: AC
  Filled 2020-03-12: qty 20

## 2020-03-12 MED ORDER — LACTATED RINGERS IV SOLN: INTRAVENOUS | Status: DC

## 2020-03-12 MED ORDER — PROPOFOL 500 MG/50ML IV EMUL
INTRAVENOUS | Status: DC | PRN
  Administered 2020-03-12: 150 ug/kg/min via INTRAVENOUS

## 2020-03-12 MED ORDER — LIDOCAINE 2% (20 MG/ML) 5 ML SYRINGE
INTRAMUSCULAR | Status: DC | PRN
  Administered 2020-03-12: 100 mg via INTRAVENOUS

## 2020-03-12 MED ORDER — NADOLOL 20 MG PO TABS: 20.0000 mg | ORAL_TABLET | Freq: Every day | ORAL | 3 refills | Status: DC

## 2020-03-12 MED FILL — NADOLOL 20 MG TABS: 20 | 30 days supply | Qty: 30 | Fill #0

## 2020-03-12 SURGICAL SUPPLY — 14 items

## 2020-03-12 NOTE — Anesthesia Postprocedure Evaluation (Signed)
Anesthesia Post Note  Patient: Linda Becker  Procedure(s) Performed: ESOPHAGOGASTRODUODENOSCOPY (EGD) WITH PROPOFOL (N/A )     Patient location during evaluation: PACU Anesthesia Type: MAC Level of consciousness: awake and alert Pain management: pain level controlled Vital Signs Assessment: post-procedure vital signs reviewed and stable Respiratory status: spontaneous breathing Cardiovascular status: stable Anesthetic complications: no   No complications documented.  Last Vitals:  Vitals:   03/12/20 1340 03/12/20 1350  BP: 129/65   Pulse: 78 82  Resp: 15 18  Temp:    SpO2: 97% 100%    Last Pain:  Vitals:   03/12/20 1350  TempSrc:   PainSc: 0-No pain                 Nolon Nations

## 2020-03-12 NOTE — Progress Notes (Signed)
Dr [name] has asked that patient be given nadolol 20 mg daily to take for prophylaxis of variceal bleeding. Rx sent to pharmacy.

## 2020-03-12 NOTE — Interval H&P Note (Signed)
History and Physical Interval Note: For EGD today to evaluate for residual esophageal varices.  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 No new complaints today.  No abdominal pain.  No recent fevers or chills.  No black stools or GI bleeding The nature of the procedure, as well as the risks, benefits, and alternatives were carefully and thoroughly reviewed with the patient. Ample time for discussion and questions allowed. The patient understood, was satisfied, and agreed to proceed.    03/12/2020 12:51 PM  Linda Becker  has presented today for surgery, with the diagnosis of Variceal Banding.  The various methods of treatment have been discussed with the patient and family. After consideration of risks, benefits and other options for treatment, the patient has consented to  Procedure(s): ESOPHAGOGASTRODUODENOSCOPY (EGD) WITH PROPOFOL (N/A) as a surgical intervention.  The patient's history has been reviewed, patient examined, no change in status, stable for surgery.  I have reviewed the patient's chart and labs.  Questions were answered to the patient's satisfaction.     [name] Ortencia Askari

## 2020-03-12 NOTE — Discharge Instructions (Signed)
YOU HAD AN ENDOSCOPIC PROCEDURE TODAY: Refer to the procedure report and other information in the discharge instructions given to you for any specific questions about what was found during the examination. If this information does not answer your questions, please call [facility] office at [phone] to clarify.   YOU SHOULD EXPECT: Some feelings of bloating in the abdomen. Passage of more gas than usual. Walking can help get rid of the air that was put into your GI tract during the procedure and reduce the bloating. If you had a lower endoscopy (such as a colonoscopy or flexible sigmoidoscopy) you may notice spotting of blood in your stool or on the toilet paper. Some abdominal soreness may be present for a day or two, also.  DIET: Your first meal following the procedure should be a light meal and then it is ok to progress to your normal diet. A half-sandwich or bowl of soup is an example of a good first meal. Heavy or fried foods are harder to digest and may make you feel nauseous or bloated. Drink plenty of fluids but you should avoid alcoholic beverages for 24 hours. If you had a esophageal dilation, please see attached instructions for diet.    ACTIVITY: Your care partner should take you home directly after the procedure. You should plan to take it easy, moving slowly for the rest of the day. You can resume normal activity the day after the procedure however YOU SHOULD NOT DRIVE, use power tools, machinery or perform tasks that involve climbing or major physical exertion for 24 hours (because of the sedation medicines used during the test).   SYMPTOMS TO REPORT IMMEDIATELY: A gastroenterologist can be reached at any hour. Please call [phone]  for any of the following symptoms:   . Following upper endoscopy (EGD, EUS, ERCP, esophageal dilation) Vomiting of blood or coffee ground material  New, significant abdominal pain  New, significant chest pain or pain under the shoulder blades  Painful  or persistently difficult swallowing  New shortness of breath  Black, tarry-looking or red, bloody stools  FOLLOW UP:  If any biopsies were taken you will be contacted by phone or by letter within the next 1-3 weeks. Call [phone]  if you have not heard about the biopsies in 3 weeks.  Please also call with any specific questions about appointments or follow up tests.

## 2020-03-12 NOTE — Anesthesia Preprocedure Evaluation (Addendum)
Anesthesia Evaluation  Patient identified by MRN, date of birth, ID band Patient awake    Reviewed: Allergy & Precautions, NPO status , Patient's Chart, lab work & pertinent test results  History of Anesthesia Complications Negative for: history of anesthetic complications  Airway Mallampati: III  TM Distance: >3 FB Neck ROM: Full    Dental  (+) Dental Advisory Given, Teeth Intact   Pulmonary asthma ,    breath sounds clear to auscultation       Cardiovascular + Valvular Problems/Murmurs  Rhythm:Regular Rate:Normal     Neuro/Psych PSYCHIATRIC DISORDERS Bipolar Disorder negative neurological ROS     GI/Hepatic GERD  ,(+) Cirrhosis   Esophageal Varices    , Hepatitis - (NASH)  Endo/Other  diabetesHypothyroidism Morbid obesity  Renal/GU negative Renal ROS  negative genitourinary   Musculoskeletal negative musculoskeletal ROS (+)   Abdominal (+) + obese,   Peds  Hematology negative hematology ROS (+)   Anesthesia Other Findings   Reproductive/Obstetrics                            Anesthesia Physical  Anesthesia Plan  ASA: III  Anesthesia Plan: MAC   Post-op Pain Management:    Induction: Intravenous  PONV Risk Score and Plan: 2 and Propofol infusion and Treatment may vary due to age or medical condition  Airway Management Planned: Nasal Cannula  Additional Equipment: None  Intra-op Plan:   Post-operative Plan:   Informed Consent: I have reviewed the patients History and Physical, chart, labs and discussed the procedure including the risks, benefits and alternatives for the proposed anesthesia with the patient or authorized representative who has indicated his/her understanding and acceptance.     Dental advisory given  Plan Discussed with: CRNA  Anesthesia Plan Comments:         Anesthesia Quick Evaluation

## 2020-03-12 NOTE — Op Note (Signed)
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 [name], MD, Baird [facility], RN, William Dalton, Technician Referring MD:              Medicines:                Monitored Anesthesia Care Complications:            No immediate complications. Estimated Blood Loss:     Estimated blood loss: none. Procedure:                Pre-Anesthesia Assessment:                           - Prior to the procedure, a History and Physical                            was performed, and patient medications and                            allergies were reviewed. The patient's tolerance of                            previous anesthesia was also reviewed. The risks                            and benefits of the procedure and the sedation                            options and risks were discussed with the patient.                            All questions were answered, and informed consent                            was obtained. Prior Anticoagulants: The patient has                            taken no previous anticoagulant or antiplatelet                            agents. ASA Grade Assessment: III - A patient with  severe systemic disease. After reviewing the risks                            and benefits, the patient was deemed in                            satisfactory condition to undergo the procedure.                           After obtaining informed consent, the endoscope was                            passed under direct vision. Throughout the                             procedure, the patient's blood pressure, pulse, and                            oxygen saturations were monitored continuously. The                            GIF-H190 ([id number]) Olympus gastroscope was                            introduced through the mouth, and advanced to the                            second part of duodenum. The upper GI endoscopy was                            accomplished without difficulty. The patient                            tolerated the procedure well. Scope In: Scope Out: Findings:      Three columns of grade I varices with no bleeding and no stigmata of       recent bleeding were found in the distal esophagus. They were small in       size and flatten completely with insufflation. Scarring from prior       treatment was visible.      Mild portal hypertensive gastropathy was found in the cardia and in the       gastric fundus.      Nodular mucosa was found in the gastric antrum. This has been previously       seen and biopsied (H Pylori negative).      The examined duodenum was normal. Impression:               - Small, grade I esophageal varices with no                            bleeding and no stigmata of recent bleeding.                           - Mild portal hypertensive gastropathy.                           -  Nodular mucosa in the gastric antrum.                           - Normal examined duodenum.                           - No specimens collected. Moderate Sedation:      N/A Recommendation:           - Patient has a contact number available for                            emergencies. The signs and symptoms of potential                            delayed complications were discussed with the                            patient. Return to normal activities tomorrow.                            Written discharge instructions were provided to the                            patient.                           - Resume previous diet.                            - Continue present medications.                           - Initiation of nonselective beta blocker is                            recommended, specifically nadolol. Beta blockers                            like nadolol are often avoided in pregnancy and                            pregnancy remains not recommended given the risk of                            further liver decompensation and recurrent variceal                            hemorrhage.                           - Proceed with 2nd opinion liver transplant                            evaluation at [facility] and hepatology opinion regarding  TIPS.                           - Repeat EGD in 1 year for variceal surveillance                            (given prior hemorrhage and EVL). Procedure Code(s):        --- Professional ---                           [phone], Esophagogastroduodenoscopy, flexible,                            transoral; diagnostic, including collection of                            specimen(s) by brushing or washing, when performed                            (separate procedure) Diagnosis Code(s):        --- Professional ---                           I85.00, Esophageal varices without bleeding                           K76.6, Portal hypertension                           K31.89, Other diseases of stomach and duodenum CPT copyright 2019 American Medical Association. All rights reserved. The codes documented in this report are preliminary and upon coder review may  be revised to meet current compliance requirements. Jerene Bears, MD 03/12/2020 1:37:08 PM This report has been signed electronically. Number of Addenda: 0

## 2020-03-12 NOTE — Transfer of Care (Signed)
Immediate Anesthesia Transfer of Care Note  Patient: [name]  Procedure(s) Performed: ESOPHAGOGASTRODUODENOSCOPY (EGD) WITH PROPOFOL (N/A )  Patient Location: Endoscopy Unit  Anesthesia Type:MAC  Level of Consciousness: drowsy and patient cooperative  Airway & Oxygen Therapy: Patient Spontanous Breathing and Patient connected to nasal cannula oxygen  Post-op Assessment: Report given to RN and Post -op Vital signs reviewed and stable  Post vital signs: Reviewed and stable  Last Vitals:  Vitals Value Taken Time  BP    Temp    Pulse    Resp    SpO2      Last Pain:  Vitals:   03/12/20 1147  TempSrc: Oral  PainSc: 0-No pain         Complications: No complications documented.

## 2020-03-13 ENCOUNTER — Telehealth (HOSPITAL_COMMUNITY): Payer: Self-pay | Admitting: Psychiatry

## 2020-03-13 ENCOUNTER — Encounter (HOSPITAL_COMMUNITY): Payer: Self-pay | Admitting: Internal Medicine

## 2020-03-13 DIAGNOSIS — Z23 Encounter for immunization: Secondary | ICD-10-CM | POA: Diagnosis not present

## 2020-03-13 NOTE — Telephone Encounter (Signed)
D: Called pt in order to do the CCA (Assessment) for virtual MH-IOP.  According to pt, she has received a call from the transplant staff @ [facility] and has been put on the transplant list.  Pt reports she has multiple upcoming appointments for herself and her mother.  Pt is mother's transportation. Case manager inquired would pt be able to attend group daily, minus one or two appointments here/there?  Pt admitted that she wouldn't be able to attend daily and didn't know how often she could even attend due to all her and her mother's appts.  A:  Provided pt with support.  Discussed [facility] thru [facility] where she could pick and choose the days and groups that would be fit within her schedule.  Encouraged pt to f/u back up with Francie Massing, [facility].  R:  Pt receptive.

## 2020-03-13 NOTE — Telephone Encounter (Signed)
I have again contacted [facility] to inquire regarding patient appointment with Dr Charlean Sanfilippo. Unfortunately, I was sent to a voicemail for Linda Becker who apparently works in transplant (phone [phone]). I have left a message asking for a return call regarding updated appointment information on this patient.

## 2020-03-15 DIAGNOSIS — F319 Bipolar disorder, unspecified: Secondary | ICD-10-CM | POA: Diagnosis not present

## 2020-03-15 DIAGNOSIS — F429 Obsessive-compulsive disorder, unspecified: Secondary | ICD-10-CM | POA: Diagnosis not present

## 2020-03-15 DIAGNOSIS — F4310 Post-traumatic stress disorder, unspecified: Secondary | ICD-10-CM | POA: Diagnosis not present

## 2020-03-15 NOTE — Telephone Encounter (Signed)
I have spoken to A[facility] at [facility] who indicates that patient's information is currently being reviewed by hepatology transplant team and they will call patient for appointment after review has been completed.

## 2020-03-18 NOTE — Progress Notes (Deleted)
No chief complaint on file.   HPI: Patient  Linda Becker  29 y.o. comes in today for Preventive Health Care visit   Health Maintenance  Topic Date Due   COVID-19 Vaccine (1) Never done   INFLUENZA VACCINE  01/15/2020   PAP-Cervical Cytology Screening  02/14/2022   PAP SMEAR-Modifier  02/14/2022   TETANUS/TDAP  11/12/2027   Hepatitis C Screening  Completed   HIV Screening  Completed   Health Maintenance Review LIFESTYLE:  Exercise:   Tobacco/ETS: Alcohol:  Sugar beverages: Sleep: Drug use: no HH of  Work:    ROS:  GEN/ HEENT: No fever, significant weight changes sweats headaches vision problems hearing changes, CV/ PULM; No chest pain shortness of breath cough, syncope,edema  change in exercise tolerance. GI /GU: No adominal pain, vomiting, change in bowel habits. No blood in the stool. No significant GU symptoms. SKIN/HEME: ,no acute skin rashes suspicious lesions or bleeding. No lymphadenopathy, nodules, masses.  NEURO/ PSYCH:  No neurologic signs such as weakness numbness. No depression anxiety. IMM/ Allergy: No unusual infections.  Allergy .   REST of 12 system review negative except as per HPI   Past Medical History:  Diagnosis Date   Allergic rhinitis    remote hx of allergy shots   Asthma    since childhood  on controller meds extrinsic dr Carmelina Peal   Bipolar II disorder ([place])    Cirrhosis ([place])    Esophageal varices ([place])    Family history of adverse reaction to anesthesia    Gallbladder sludge    Gastropathy    GERD (gastroesophageal reflux disease)    on nexium  for  long term sx since childhood   H/O miscarriage, not currently pregnant    [redacted] weeks  march 2016   Hepatic steatosis    Hypothyroidism    Migraine    Murmur    pt reports MVP   Portal hypertension ([place])    Sessile colonic polyp    Splenomegaly    Steatohepatitis    Syncope    under eval ? cause   Tachycardia    episodes with near syncope eval dr  Nadyne Coombes  on no meds dced LABA   Thrombocytopenia ([place])    Upper GI bleed     Past Surgical History:  Procedure Laterality Date   BIOPSY  09/03/2018   Procedure: BIOPSY;  Surgeon: Jerene Bears, MD;  Location: [facility];  Service: Gastroenterology;;   broken right femur  2008   rod placed   COLONOSCOPY WITH PROPOFOL N/A 09/03/2018   Procedure: COLONOSCOPY WITH PROPOFOL;  Surgeon: Jerene Bears, MD;  Location: [facility];  Service: Gastroenterology;  Laterality: N/A;   DILATION AND CURETTAGE OF UTERUS     ESOPHAGOGASTRODUODENOSCOPY (EGD) WITH PROPOFOL N/A 09/03/2018   Procedure: ESOPHAGOGASTRODUODENOSCOPY (EGD) WITH PROPOFOL;  Surgeon: Jerene Bears, MD;  Location: [facility];  Service: Gastroenterology;  Laterality: N/A;   ESOPHAGOGASTRODUODENOSCOPY (EGD) WITH PROPOFOL N/A 01/27/2020   Procedure: ESOPHAGOGASTRODUODENOSCOPY (EGD) WITH PROPOFOL;  Surgeon: Rush Landmark Telford Nab., MD;  Location: [place];  Service: Gastroenterology;  Laterality: N/A;   ESOPHAGOGASTRODUODENOSCOPY (EGD) WITH PROPOFOL N/A 03/12/2020   Procedure: ESOPHAGOGASTRODUODENOSCOPY (EGD) WITH PROPOFOL;  Surgeon: Jerene Bears, MD;  Location: [facility];  Service: Gastroenterology;  Laterality: N/A;   GASTRIC VARICES BANDING  01/27/2020   Procedure: GASTRIC VARICES BANDING;  Surgeon: Rush Landmark Telford Nab., MD;  Location: [place];  Service: Gastroenterology;;   IR TRANSCATHETER BX  01/19/2019   IR US GUIDE  VASC ACCESS RIGHT  01/19/2019   IR VENOGRAM HEPATIC W HEMODYNAMIC EVALUATION  01/19/2019   LAPAROSCOPIC GASTRIC SLEEVE RESECTION N/A 06/28/2019   Procedure: LAPAROSCOPIC GASTRIC SLEEVE RESECTION, Upper Endo, ERAS Pathway;  Surgeon: Clovis Riley, MD;  Location: [facility];  Service: General;  Laterality: N/A;   OB ultrasound N/A 12/01/2017   see report   POLYPECTOMY  09/03/2018   Procedure: POLYPECTOMY;  Surgeon: Jerene Bears, MD;  Location: [facility];  Service: Gastroenterology;;    TONSILLECTOMY  08/23/2004   WISDOM TOOTH EXTRACTION      Family History  Problem Relation Age of Onset   Asthma Father    Arthritis Father    Hyperlipidemia Father    Heart disease Father    Hypertension Father    Diabetes Father    Colon polyps Father    Arthritis Mother    Hyperlipidemia Mother    Heart disease Mother    Hypertension Mother    Diabetes Mother    Kidney disease Mother    Liver disease Mother        advanced non-alcoholic cirrhosis   Bleeding Disorder Mother    Colon polyps Mother    Arthritis Maternal Grandmother    Breast cancer Maternal Grandmother    Thyroid disease Maternal Grandmother    Colon cancer Paternal Grandfather    Diabetes Paternal Grandfather    Liver cancer Paternal Grandfather    Stomach cancer Paternal Grandfather    Breast cancer Paternal Grandmother    Diabetes Paternal Grandmother    Breast cancer Paternal Aunt    Esophageal cancer Paternal Aunt    Breast cancer Maternal Aunt    Diabetes Maternal Grandfather    Pancreatic cancer Maternal Grandfather     Social History   Socioeconomic History   Marital status: Married    Spouse name: Not on file   Number of children: 1   Years of education: Not on file   Highest education level: Not on file  Occupational History   Not on file  Tobacco Use   Smoking status: Never Smoker   Smokeless tobacco: Never Used  Vaping Use   Vaping Use: Never used  Substance and Sexual Activity   Alcohol use: No    Alcohol/week: 0.0 standard drinks   Drug use: No   Sexual activity: Yes    Birth control/protection: None  Other Topics Concern   Not on file  Social History Narrative   6-7 hours of sleep per night   Works full time   Lives with her parents sis and  Infant nephew   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   Divorced   8 week preg loss Sep 12, 2022     Son passed away 08-23-17   Social Determinants of Health   Financial  Resource Strain:    Difficulty of Paying Living Expenses: Not on file  Food Insecurity:    Worried About Charity fundraiser in the Last Year: Not on file   [employer] of Food in the Last Year: Not on file  Transportation Needs:    Lack of Transportation (Medical): Not on file   Lack of Transportation (Non-Medical): Not on file  Physical Activity:    Days of Exercise per Week: Not on file   Minutes of Exercise per Session: Not on file  Stress:    Feeling of Stress : Not on file  Social Connections:    Frequency of Communication  with Friends and Family: Not on file   Frequency of Social Gatherings with Friends and Family: Not on file   Attends Religious Services: Not on file   Active Member of Clubs or Organizations: Not on file   Attends Archivist Meetings: Not on file   Marital Status: Not on file    Outpatient Medications Prior to Visit  Medication Sig Dispense Refill   albuterol (PROAIR HFA) 108 (90 Base) MCG/ACT inhaler Inhale 2 puffs into the lungs every 6 (six) hours as needed for wheezing or shortness of breath.     albuterol (PROVENTIL) (2.5 MG/3ML) 0.083% nebulizer solution Take 3 mLs (2.5 mg total) by nebulization every 4 (four) hours as needed for wheezing or shortness of breath.     ARIPiprazole (ABILIFY) 10 MG tablet Take 10 mg by mouth every morning.     Ascorbic Acid (VITAMIN C WITH ROSE HIPS) 500 MG tablet Take 500 mg by mouth 2 (two) times daily.     Beclomethasone Dipropionate (QNASL) 80 MCG/ACT AERS INSTILL 1 SPRAY IN EACH NOSTRIL ONCE DAILY AS DIRECTED (Patient taking differently: Place 2 sprays into the nose daily. ) 31.8 g 1   calcium carbonate (OSCAL) 1500 (600 Ca) MG TABS tablet Take 600 mg of elemental calcium by mouth 2 (two) times daily with a meal.     Cetirizine HCl 10 MG CAPS Take 10 mg by mouth 2 (two) times daily.      dupilumab (DUPIXENT) 300 MG/2ML prefilled syringe Inject 300 mg into the skin every 14 (fourteen) days. 4  mL 11   ferrous sulfate 325 (65 FE) MG tablet Take 325 mg by mouth daily with breakfast.     FLUoxetine (PROZAC) 40 MG capsule Take 40 mg by mouth daily.      gabapentin (NEURONTIN) 300 MG capsule Take 300 mg by mouth See admin instructions. Take 300 mg in the morning and afternoon, and 600 mg at bedtime     lamoTRIgine (LAMICTAL) 150 MG tablet Take 300 mg by mouth daily.      levothyroxine (SYNTHROID) 88 MCG tablet Take 1 tablet (88 mcg total) by mouth daily before breakfast.     LORazepam (ATIVAN) 0.5 MG tablet Take 0.5 mg by mouth at bedtime.     montelukast (SINGULAIR) 10 MG tablet Take 1 tablet (10 mg total) by mouth daily. 90 tablet 1   nadolol (CORGARD) 20 MG tablet Take 1 tablet (20 mg total) by mouth daily. 30 tablet 3   Olopatadine HCl 0.2 % SOLN Can use one drop in each eye once daily if needed. (Patient taking differently: Place 1 drop into both eyes daily as needed (allergies). ) 7.5 mL 1   ondansetron (ZOFRAN ODT) 8 MG disintegrating tablet Take 1 tablet (8 mg total) by mouth every 8 (eight) hours as needed for nausea or vomiting. 30 tablet 1   ondansetron (ZOFRAN) 8 MG tablet Take 8 mg by mouth daily as needed for nausea/vomiting.     pantoprazole (PROTONIX) 40 MG tablet Take 1 tablet (40 mg total) by mouth 2 (two) times daily before a meal. 60 tablet 0   Prenatal Vit-Fe Fumarate-FA (MULTIVITAMIN-PRENATAL) 27-0.8 MG TABS tablet Take 1 tablet by mouth daily.      promethazine (PHENERGAN) 25 MG tablet Take 1 tablet (25 mg total) by mouth every 6 (six) hours as needed for nausea. 30 tablet 2   SYMBICORT 160-4.5 MCG/ACT inhaler INHALE 2 PUFFS BY MOUTH TWICE A DAY TO PREVENT COUGH OR WHEEZE. RINSE, GARGLE,  AND SPIT AFTER USE. (Patient taking differently: Inhale 2 puffs into the lungs See admin instructions. INHALE 2 PUFFS BY MOUTH TWICE A DAY TO PREVENT COUGH OR WHEEZE. RINSE, GARGLE, AND SPIT AFTER USE.) 30.6 g 1   topiramate (TOPAMAX) 50 MG tablet Take 50 mg by mouth 2  (two) times daily.     Vitamin D3 (VITAMIN D) 25 MCG tablet Take 1,000 Units by mouth daily.     vitamin E 400 UNIT capsule Take 400 Units by mouth 2 (two) times daily.     Facility-Administered Medications Prior to Visit  Medication Dose Route Frequency Provider Last Rate Last Admin   promethazine (PHENERGAN) tablet 12.5-25 mg  12.5-25 mg Oral Q4H PRN Clovis Riley, MD       Or   promethazine (PHENERGAN) suppository 12.5-25 mg  12.5-25 mg Rectal Q4H PRN Romana Juniper A, MD       sodium chloride 0.9 % 1,000 mL with thiamine 092 mg, folic acid 1 mg, multivitamins adult 10 mL infusion   Intravenous Continuous Clovis Riley, MD         EXAM:  LMP 03/12/2020 (LMP Unknown)   There is no height or weight on file to calculate BMI. Wt Readings from Last 3 Encounters:  03/12/20 274 lb 0.5 oz (124.3 kg)  02/24/20 274 lb (124.3 kg)  02/07/20 281 lb 4.8 oz (127.6 kg)    Physical Exam: Vital signs reviewed ZRA:QTMA is a well-developed well-nourished alert cooperative    who appearsr stated age in no acute distress.  HEENT: normocephalic atraumatic , Eyes: PERRL EOM's full, conjunctiva clear, Nares: paten,t no deformity discharge or tenderness., Ears: no deformity EAC's clear TMs with normal landmarks. Mouth: clear OP, no lesions, edema.  Moist mucous membranes. Dentition in adequate repair. NECK: supple without masses, thyromegaly or bruits. CHEST/PULM:  Clear to auscultation and percussion breath sounds equal no wheeze , rales or rhonchi. No chest wall deformities or tenderness. Breast: normal by inspection . No dimpling, discharge, masses, tenderness or discharge . CV: PMI is nondisplaced, S1 S2 no gallops, murmurs, rubs. Peripheral pulses are full without delay.No JVD .  ABDOMEN: Bowel sounds normal nontender  No guard or rebound, no hepato splenomegal no CVA tenderness.  No hernia. Extremtities:  No clubbing cyanosis or edema, no acute joint swelling or redness no focal  atrophy NEURO:  Oriented x3, cranial nerves 3-12 appear to be intact, no obvious focal weakness,gait within normal limits no abnormal reflexes or asymmetrical SKIN: No acute rashes normal turgor, color, no bruising or petechiae. PSYCH: Oriented, good eye contact, no obvious depression anxiety, cognition and judgment appear normal. LN: no cervical axillary inguinal adenopathy  Lab Results  Component Value Date   WBC 5.8 02/24/2020   HGB 13.6 02/24/2020   HCT 40.2 02/24/2020   PLT 69.0 (L) 02/24/2020   GLUCOSE 101 (H) 02/24/2020   CHOL 184 03/11/2019   TRIG 99.0 03/11/2019   HDL 41.80 03/11/2019   LDLCALC 122 (H) 03/11/2019   ALT 31 02/24/2020   AST 26 02/24/2020   NA 142 02/24/2020   K 4.1 02/24/2020   CL 109 02/24/2020   CREATININE 0.93 02/24/2020   BUN 8 02/24/2020   CO2 27 02/24/2020   TSH 1.87 09/27/2019   INR 1.2 (H) 02/24/2020   HGBA1C 5.2 03/11/2019    BP Readings from Last 3 Encounters:  03/12/20 129/65  02/24/20 108/72  02/07/20 (!) 96/59    Lab results reviewed with patient   ASSESSMENT AND PLAN:  Discussed the following assessment and plan:  No diagnosis found. No follow-ups on file. flu Patient Care Team: Kage Willmann, Standley Brooking, MD as PCP - General (Internal Medicine) Neldon Mc, Donnamarie Poag, MD as Consulting Physician (Allergy and Immunology) Adrian Prows, MD as Consulting Physician (Cardiology) Elayne Snare, MD as Consulting Physician (Endocrinology) Pyrtle, [name], MD as Consulting Physician (Gastroenterology) Neldon Mc Donnamarie Poag, MD as Consulting Physician (Allergy and Immunology) There are no Patient Instructions on file for this visit.  Standley Brooking. Vincente Asbridge M.D.

## 2020-03-19 ENCOUNTER — Encounter: Payer: 59 | Admitting: Internal Medicine

## 2020-03-20 DIAGNOSIS — Z0279 Encounter for issue of other medical certificate: Secondary | ICD-10-CM

## 2020-03-20 MED FILL — MONTELUKAST SOD 10 MG TAB: 10 | 90 days supply | Qty: 90 | Fill #1

## 2020-03-20 MED FILL — PROMETHAZINE 25 MG TABLET: 25 | 7 days supply | Qty: 30 | Fill #1

## 2020-03-21 NOTE — Telephone Encounter (Signed)
FMLA paperwork was faxed and returned to patient on 03/16/20. Additional paperwork received from patient on 11/18/80 for medical certification for ADA accomodation request. This paperwork has been filled out today and will be awaiting patient payment and pick up as of tomorrow.

## 2020-03-22 DIAGNOSIS — Z0279 Encounter for issue of other medical certificate: Secondary | ICD-10-CM

## 2020-03-24 MED FILL — GABAPENTIN 300 MG CAPSULE: 300 | 30 days supply | Qty: 120 | Fill #1

## 2020-03-26 ENCOUNTER — Ambulatory Visit (INDEPENDENT_AMBULATORY_CARE_PROVIDER_SITE_OTHER): Payer: 59 | Admitting: Internal Medicine

## 2020-03-26 DIAGNOSIS — Z23 Encounter for immunization: Secondary | ICD-10-CM

## 2020-03-26 NOTE — Telephone Encounter (Signed)
I have again contacted [facility] and have spoken to [place]. She indicates that patients' information is still in "referrals" and that typically there is a 30 day turn around time. However, they are behind and are currently experiencing a 60 day turn around time. I have left a message asking patient to call back so I can keep her informed of the status of the referral.

## 2020-03-27 ENCOUNTER — Other Ambulatory Visit: Payer: Self-pay

## 2020-03-27 ENCOUNTER — Other Ambulatory Visit: Payer: Self-pay | Admitting: Allergy and Immunology

## 2020-03-27 MED FILL — ALBUTEROL SULFATE HFA 108 (: 108 (90 BAS | 16 days supply | Qty: 18 | Fill #0

## 2020-03-27 MED FILL — SUBVENITE 150 MG TABS: 150 | 30 days supply | Qty: 30 | Fill #0

## 2020-03-29 ENCOUNTER — Ambulatory Visit: Payer: 59 | Admitting: Professional

## 2020-03-30 ENCOUNTER — Ambulatory Visit (INDEPENDENT_AMBULATORY_CARE_PROVIDER_SITE_OTHER): Payer: 59 | Admitting: Professional

## 2020-03-30 DIAGNOSIS — F411 Generalized anxiety disorder: Secondary | ICD-10-CM

## 2020-04-03 ENCOUNTER — Other Ambulatory Visit (INDEPENDENT_AMBULATORY_CARE_PROVIDER_SITE_OTHER): Payer: 59

## 2020-04-03 ENCOUNTER — Telehealth: Payer: Self-pay | Admitting: Internal Medicine

## 2020-04-03 DIAGNOSIS — K7460 Unspecified cirrhosis of liver: Secondary | ICD-10-CM | POA: Diagnosis not present

## 2020-04-03 LAB — CBC WITH DIFFERENTIAL/PLATELET
Basophils Absolute: 0 10*3/uL (ref 0.0–0.1)
Basophils Relative: 0.3 % (ref 0.0–3.0)
Eosinophils Absolute: 0.1 10*3/uL (ref 0.0–0.7)
Eosinophils Relative: 2.1 % (ref 0.0–5.0)
HCT: 38.8 % (ref 36.0–46.0)
Hemoglobin: 12.8 g/dL (ref 12.0–15.0)
Lymphocytes Relative: 30.9 % (ref 12.0–46.0)
Lymphs Abs: 1 10*3/uL (ref 0.7–4.0)
MCHC: 33 g/dL (ref 30.0–36.0)
MCV: 95 fl (ref 78.0–100.0)
Monocytes Absolute: 0.1 10*3/uL (ref 0.1–1.0)
Monocytes Relative: 3.9 % (ref 3.0–12.0)
Neutro Abs: 2 10*3/uL (ref 1.4–7.7)
Neutrophils Relative %: 62.8 % (ref 43.0–77.0)
Platelets: 59 10*3/uL — ABNORMAL LOW (ref 150.0–400.0)
RBC: 4.09 Mil/uL (ref 3.87–5.11)
RDW: 13.9 % (ref 11.5–15.5)
WBC: 3.3 10*3/uL — ABNORMAL LOW (ref 4.0–10.5)

## 2020-04-03 LAB — COMPREHENSIVE METABOLIC PANEL
ALT: 26 U/L (ref 0–35)
AST: 19 U/L (ref 0–37)
Albumin: 3.8 g/dL (ref 3.5–5.2)
Alkaline Phosphatase: 90 U/L (ref 39–117)
BUN: 5 mg/dL — ABNORMAL LOW (ref 6–23)
CO2: 23 mEq/L (ref 19–32)
Calcium: 8.7 mg/dL (ref 8.4–10.5)
Chloride: 112 mEq/L (ref 96–112)
Creatinine, Ser: 0.7 mg/dL (ref 0.40–1.20)
GFR: 118.41 mL/min (ref >60.00)
Glucose, Bld: 141 mg/dL — ABNORMAL HIGH (ref 70–99)
Potassium: 3.6 mEq/L (ref 3.5–5.1)
Sodium: 141 mEq/L (ref 135–145)
Total Bilirubin: 0.5 mg/dL (ref 0.2–1.2)
Total Protein: 6.1 g/dL (ref 6.0–8.3)

## 2020-04-03 LAB — PROTIME-INR
INR: 1.3 ratio — ABNORMAL HIGH (ref 0.8–1.0)
Prothrombin Time: 14.1 s — ABNORMAL HIGH (ref 9.6–13.1)

## 2020-04-03 NOTE — Telephone Encounter (Signed)
Patient states she has a lot of nausea and diarrhea for about a week now and also states she has a bruised leg and she does not know why. Is highly concerned please advise

## 2020-04-03 NOTE — Telephone Encounter (Signed)
Symptoms are suspicious for possible infection She is not known to have ascites Given her cirrhosis and portal HTN we need to check  GI pathogen panel, CBC, CMP, INR If symptoms worsen she needs to be seen in the ED.  Also to ED for blood in stools/melena, fever, blood or coffee grounds in vomitus We will see what labs show and how she is feeling a bit later and determine next steps

## 2020-04-03 NOTE — Telephone Encounter (Signed)
Pt states over the weekend she started having weird symptoms. States her leg is covered in bruises, she is very tired and states her stomach is killing her. It has been waking her up at night. This morning she woke up at 5am and started vomiting even after taking phenergan around the clock. Also has had 8 diarrhea stools and has taken Imodium. She wants to know what Dr. [name] recommends. Please advise.

## 2020-04-03 NOTE — Telephone Encounter (Signed)
Spoke with pt and she is aware. Orders in epic.

## 2020-04-04 ENCOUNTER — Telehealth: Payer: Self-pay

## 2020-04-04 ENCOUNTER — Other Ambulatory Visit: Payer: Self-pay

## 2020-04-04 DIAGNOSIS — R233 Spontaneous ecchymoses: Secondary | ICD-10-CM

## 2020-04-04 DIAGNOSIS — R238 Other skin changes: Secondary | ICD-10-CM

## 2020-04-04 DIAGNOSIS — K7460 Unspecified cirrhosis of liver: Secondary | ICD-10-CM

## 2020-04-04 NOTE — Telephone Encounter (Signed)
I checked her Plts which are low, but not lower than usual for her with her liver disease Her INR is also slightly elevated, but to the point where I would expect spontaneous hemorrhage The bruising is definitely not normal. With concern with hemostasis and acute worsening of severe abd pain I think prudent to:  1. Repeat imaging: CT abd/pelvis with contrast 2. Hematology referral: severe bruising in patient with cirrhosis, portal HTN, and thrombocytopenia  Again, if acutely worsening she needs to go to the ER Thanks JMP

## 2020-04-06 NOTE — Telephone Encounter (Signed)
Patient is scheduled to see [facility] on 04/13/20.

## 2020-04-09 ENCOUNTER — Telehealth: Payer: Self-pay | Admitting: Hematology and Oncology

## 2020-04-09 NOTE — Telephone Encounter (Signed)
Scheduled per 10/21 staff msg. Called pt left a msg.

## 2020-04-12 ENCOUNTER — Other Ambulatory Visit (HOSPITAL_COMMUNITY): Payer: Self-pay

## 2020-04-12 MED FILL — LORazepam 0.5 MG TABS: 0.5 | 30 days supply | Qty: 60 | Fill #0

## 2020-04-12 MED FILL — LAMOTRIGINE 100 MG TABS: 100 | 30 days supply | Qty: 30 | Fill #0

## 2020-04-12 MED FILL — ALBUTEROL SULFATE HFA 108 (: 108 (90 BAS | 16 days supply | Qty: 18 | Fill #0

## 2020-04-12 MED FILL — ONDANSETRON ODT 8 MG TABLET: 8 | 10 days supply | Qty: 30 | Fill #1

## 2020-04-13 ENCOUNTER — Ambulatory Visit: Payer: 59 | Admitting: Professional

## 2020-04-13 ENCOUNTER — Other Ambulatory Visit (HOSPITAL_COMMUNITY): Payer: Self-pay | Admitting: Internal Medicine

## 2020-04-13 ENCOUNTER — Ambulatory Visit (HOSPITAL_COMMUNITY): Payer: 59 | Attending: Internal Medicine

## 2020-04-13 DIAGNOSIS — I8500 Esophageal varices without bleeding: Secondary | ICD-10-CM | POA: Diagnosis not present

## 2020-04-13 DIAGNOSIS — K7581 Nonalcoholic steatohepatitis (NASH): Secondary | ICD-10-CM | POA: Diagnosis not present

## 2020-04-13 DIAGNOSIS — K7460 Unspecified cirrhosis of liver: Secondary | ICD-10-CM | POA: Diagnosis not present

## 2020-04-13 DIAGNOSIS — K766 Portal hypertension: Secondary | ICD-10-CM | POA: Diagnosis not present

## 2020-04-13 DIAGNOSIS — L299 Pruritus, unspecified: Secondary | ICD-10-CM | POA: Diagnosis not present

## 2020-04-13 MED FILL — URSODIOL 300 MG CAPS: 300 | 30 days supply | Qty: 60 | Fill #0

## 2020-04-17 MED FILL — GABAPENTIN 300 MG CAPSULE: 300 | 30 days supply | Qty: 120 | Fill #0

## 2020-04-24 ENCOUNTER — Inpatient Hospital Stay: Payer: 59 | Attending: Hematology and Oncology | Admitting: Hematology and Oncology

## 2020-04-24 NOTE — Telephone Encounter (Addendum)
Patient has paid for paperwork to be filled out and faxed. All paperwork ( 70 pages including medical records/disability forms) has been faxed to [facility] at fax [phone]. Original forms should be able to be viewed under "media" tab in Epic.

## 2020-04-24 NOTE — Assessment & Plan Note (Deleted)
Lab review: 02/04/2018: Platelets 135 07/27/2018: Platelet count 102 01/28/2020: Platelet count 53 02/07/2020: Platelet count 63 04/03/2020: Platelet count 59  frequent nausea because of the gastric bypass surgery that she had undergone in January 2021.  When she does not take nausea medication she feels extremely nauseated and she starts throwing up and the gastric viruses tend to bleed. on Phenergan.  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  Hepatitis A showed seropositivity  Easy bruising: Probably some component of platelet dysfunction.  We will check for von Willebrand factor antigen   Patient informed that she wants to get pregnant.  If she does get pregnant then we will have to watch even more closely and consider treating her with platelet transfusions at the time of delivery.   continue with watchful monitoring.

## 2020-04-27 ENCOUNTER — Ambulatory Visit (INDEPENDENT_AMBULATORY_CARE_PROVIDER_SITE_OTHER): Payer: 59 | Admitting: Professional

## 2020-04-27 DIAGNOSIS — F411 Generalized anxiety disorder: Secondary | ICD-10-CM

## 2020-04-28 MED FILL — PANTOPRAZOLE SOD DR 40 MG T: 40 | 90 days supply | Qty: 90 | Fill #2

## 2020-04-28 MED FILL — PROMETHAZINE 25 MG TABLET: 25 | 7 days supply | Qty: 30 | Fill #2

## 2020-04-28 MED FILL — GABAPENTIN 300 MG CAPSULE: 300 | 30 days supply | Qty: 120 | Fill #0

## 2020-05-01 ENCOUNTER — Other Ambulatory Visit (INDEPENDENT_AMBULATORY_CARE_PROVIDER_SITE_OTHER): Payer: 59

## 2020-05-01 ENCOUNTER — Other Ambulatory Visit: Payer: Self-pay | Admitting: Internal Medicine

## 2020-05-01 ENCOUNTER — Encounter: Payer: Self-pay | Admitting: Internal Medicine

## 2020-05-01 ENCOUNTER — Ambulatory Visit: Payer: 59 | Admitting: Internal Medicine

## 2020-05-01 VITALS — BP 110/72 | HR 66 | Ht 65.0 in | Wt 269.0 lb

## 2020-05-01 DIAGNOSIS — R1010 Upper abdominal pain, unspecified: Secondary | ICD-10-CM | POA: Diagnosis not present

## 2020-05-01 DIAGNOSIS — K766 Portal hypertension: Secondary | ICD-10-CM

## 2020-05-01 DIAGNOSIS — K7290 Hepatic failure, unspecified without coma: Secondary | ICD-10-CM | POA: Diagnosis not present

## 2020-05-01 DIAGNOSIS — R110 Nausea: Secondary | ICD-10-CM | POA: Diagnosis not present

## 2020-05-01 DIAGNOSIS — I8511 Secondary esophageal varices with bleeding: Secondary | ICD-10-CM | POA: Diagnosis not present

## 2020-05-01 DIAGNOSIS — K7581 Nonalcoholic steatohepatitis (NASH): Secondary | ICD-10-CM | POA: Diagnosis not present

## 2020-05-01 DIAGNOSIS — K7460 Unspecified cirrhosis of liver: Secondary | ICD-10-CM | POA: Diagnosis not present

## 2020-05-01 DIAGNOSIS — K7682 Hepatic encephalopathy: Secondary | ICD-10-CM

## 2020-05-01 LAB — CK: Total CK: 53 U/L (ref 7–177)

## 2020-05-01 LAB — FERRITIN: Ferritin: 56.7 ng/mL (ref 10.0–291.0)

## 2020-05-01 LAB — TSH: TSH: 0.72 u[IU]/mL (ref 0.35–4.50)

## 2020-05-01 MED ORDER — ONDANSETRON 8 MG PO TBDP: 8.0000 mg | ORAL_TABLET | Freq: Three times a day (TID) | ORAL | 1 refills | Status: DC | PRN

## 2020-05-01 MED ORDER — PANTOPRAZOLE SODIUM 40 MG PO TBEC: 40.0000 mg | DELAYED_RELEASE_TABLET | Freq: Two times a day (BID) | ORAL | 3 refills | Status: DC

## 2020-05-01 MED ORDER — RIFAXIMIN 550 MG PO TABS: 550.0000 mg | ORAL_TABLET | Freq: Two times a day (BID) | ORAL | 3 refills | Status: DC

## 2020-05-01 MED ORDER — PROMETHAZINE HCL 25 MG PO TABS
25.0000 mg | ORAL_TABLET | ORAL | 2 refills | Status: DC | PRN
Start: 2020-05-01 — End: 2020-08-08

## 2020-05-01 MED FILL — ONDANSETRON ODT 8 MG TABLET: 8 | 10 days supply | Qty: 30 | Fill #0

## 2020-05-01 NOTE — Patient Instructions (Addendum)
Your provider has requested that you go to the basement level for lab work before leaving today. Press "B" on the elevator. The lab is located at the first door on the left as you exit the elevator.  Continue nadolol.  Continue pantoprazole.  Use Zofran (ondansetron) as needed-- this should be a back up medication for when promethazine is not effective.  We have sent the following medications to your pharmacy for you to pick up at your convenience: Xifaxan 550 mg twice daily. Promethazine  Please follow up with Dr [name] in the office on 07/10/20 @ 10:30 am.  If you are age 6 or younger, your body mass index should be between 19-25. Your Body mass index is 44.76 kg/m. If this is out of the aformentioned range listed, please consider follow up with your Primary Care Provider.   Due to recent changes in healthcare laws, you may see the results of your imaging and laboratory studies on MyChart before your provider has had a chance to review them.  We understand that in some cases there may be results that are confusing or concerning to you. Not all laboratory results come back in the same time frame and the provider may be waiting for multiple results in order to interpret others.  Please give [place] 48 hours in order for your provider to thoroughly review all the results before contacting the office for clarification of your results.

## 2020-05-01 NOTE — Progress Notes (Signed)
Subjective:    Patient ID: Linda Becker, female    DOB: 11/17/1990, 29 y.o.   MRN: [id number]  HPI Linda Becker is a 29 year old with a past medical history of biopsy-proven NASH cirrhosis with portal hypertension complicated by esophageal varices with bleeding, thrombocytopenia, obesity status post gastric sleeve in January 2001, prior sessile serrated colon polyps in 2020 who is here for follow-up.  She is here alone today.  After her last visit with me she came for repeat upper endoscopy for second-degree variceal eradication following GI bleed requiring endoscopic band ligation on 01/27/2020.  This EGD revealed three columns of very small varices with no stigmata.  They flatten completely with insufflation.  Scarring from prior treatment was visible.  Additional banding was not performed.  There was mild portal hypertensive gastropathy in the proximal stomach and nodular mucosa in the antrum.  She then went for a second opinion at a liver transplant center.  She saw Dr. Enis Gash at [facility] recently.  She had a good experience there.  Medically she is not feeling well.  This is a vague feeling of general unwellness and poor energy levels.  She states she does not do much other than laying on the couch.  She recently moved into a new home with her husband and is just had very little energy to do much.  Her appetite has been poor.  She has an upper abdominal discomfort nearly all the time but a more nagging pain when she eats.  This is in the more middle of her abdomen and almost feels like gas pain.  Bowel movements previously were all diarrhea but now are more erratic.  She can have loose stools but also skip several days without bowel movement.  She has found herself feeling forgetful and wanting to sleep all the time.  Nausea is also constant and she alternates promethazine with ondansetron.  Promethazine works better but she runs out of this medication on a monthly basis.  Both she and  her husband are not actively trying to conceive a child.  They have been advised by me and others that pregnancy would have a significant risk of increasing portal hypertension and be very high risk for morbidity and mortality.   Review of Systems As per HPI, otherwise negative  Current Medications, Allergies, Past Medical History, Past Surgical History, Family History and Social History were reviewed in [employer] record.     Objective:   Physical Exam BP 110/72   Pulse 66   Ht 5' 5"  (1.651 m)   Wt 269 lb (122 kg)   BMI 44.76 kg/m  Gen: awake, alert, NAD HEENT: anicteric, op clear CV: RRR, no mrg Pulm: CTA b/l Abd: soft, obese, diffusely tender without rebound or guarding, no appreciable ascites, nondistended, +BS throughout Ext: no c/c/e Neuro: nonfocal, question of asterixis   MRI ABDOMEN WITHOUT AND WITH CONTRAST (INCLUDING MRCP)   TECHNIQUE: Multiplanar multisequence MR imaging of the abdomen was performed both before and after the administration of intravenous contrast. Heavily T2-weighted images of the biliary and pancreatic ducts were obtained, and three-dimensional MRCP images were rendered by post processing.   CONTRAST:  84m GADAVIST GADOBUTROL 1 MMOL/ML IV SOLN   COMPARISON:  No prior abdominal MRI. CT the abdomen and pelvis 01/27/2020.   FINDINGS: Lower chest: Unremarkable.   Hepatobiliary: Very mild loss of signal intensity in the hepatic parenchyma on out of phase dual echo images, which could suggest very mild hepatic  steatosis. Liver has a slightly nodular contour, suggesting early changes of cirrhosis. No discrete cystic or solid hepatic lesions. No intra or extrahepatic biliary ductal dilatation. Gallbladder is normal in appearance.   Pancreas: No pancreatic mass. No pancreatic ductal dilatation. No pancreatic or peripancreatic fluid collections or inflammatory changes.   Spleen: Spleen is enlarged measuring up to 19.2 x  9.6 x 18.2 cm (estimated splenic volume of 1,677 mL).   Adrenals/Urinary Tract: Bilateral kidneys and adrenal glands are normal in appearance. No hydroureteronephrosis in the visualized portions of the abdomen.   Stomach/Bowel: Visualized portions are unremarkable.   Vascular/Lymphatic: No aneurysm identified in the visualized abdominal vasculature. Portal vein is dilated measuring 17 mm in the porta hepatis. Splenic vein, superior mesenteric vein, splenoportal confluence and portal vein are all patent. Recanalized paraumbilical vein and other portosystemic collateral pathways, including small gastric and paraesophageal varices. No definite lymphadenopathy noted in the abdomen.   Other: No significant volume of ascites noted in the visualized portions of the peritoneal cavity.   Musculoskeletal: No aggressive appearing osseous lesions are noted in the visualized portions of the skeleton.   IMPRESSION: 1. Mild hepatic steatosis with a suggestion of early hepatic cirrhosis. There is definitive evidence of portal venous hypertension, including dilated portal vein, splenomegaly and portosystemic collateral pathways, as above. No suspicious hepatic lesions are noted at this time.     Electronically Signed   By: Vinnie Langton M.D.   On: 03/01/2020 11:09   CBC    Component Value Date/Time   WBC 3.3 (L) 04/03/2020 1350   RBC 4.09 04/03/2020 1350   HGB 12.8 04/03/2020 1350   HGB 13.0 02/07/2020 1412   HGB 14.7 01/14/2020 1309   HCT 38.8 04/03/2020 1350   HCT 47.1 (H) 01/14/2020 1309   PLT 59.0 (L) 04/03/2020 1350   PLT 63 (L) 02/07/2020 1412   PLT 64 (L) 01/14/2020 1309   MCV 95.0 04/03/2020 1350   MCV 100.9 (H) 01/14/2020 1309   MCH 31.8 02/07/2020 1412   MCHC 33.0 04/03/2020 1350   RDW 13.9 04/03/2020 1350   RDW 13.5 01/14/2020 1309   LYMPHSABS 1.0 04/03/2020 1350   LYMPHSABS 1.3 01/14/2020 1309   MONOABS 0.1 04/03/2020 1350   EOSABS 0.1 04/03/2020 1350   EOSABS  0.1 01/14/2020 1309   BASOSABS 0.0 04/03/2020 1350   BASOSABS 0.0 01/14/2020 1309   CMP     Component Value Date/Time   NA 141 04/03/2020 1350   NA 145 01/14/2020 1309   K 3.6 04/03/2020 1350   CL 112 04/03/2020 1350   CO2 23 04/03/2020 1350   GLUCOSE 141 (H) 04/03/2020 1350   BUN 5 (L) 04/03/2020 1350   BUN 12 01/14/2020 1309   CREATININE 0.70 04/03/2020 1350   CREATININE 0.84 02/07/2020 1412   CALCIUM 8.7 04/03/2020 1350   PROT 6.1 04/03/2020 1350   PROT 6.2 (L) 01/14/2020 1309   ALBUMIN 3.8 04/03/2020 1350   ALBUMIN 3.9 01/14/2020 1309   AST 19 04/03/2020 1350   AST 31 02/07/2020 1412   ALT 26 04/03/2020 1350   ALT 47 (H) 02/07/2020 1412   ALKPHOS 90 04/03/2020 1350   BILITOT 0.5 04/03/2020 1350   BILITOT 0.3 02/07/2020 1412   GFRNONAA >60 02/07/2020 1412   GFRAA >60 02/07/2020 1412   Lab Results  Component Value Date   INR 1.3 (H) 04/03/2020   INR 1.2 (H) 02/24/2020   INR 1.1 01/19/2019       Assessment & Plan:  29 year  old with a past medical history of biopsy-proven NASH cirrhosis with portal hypertension complicated by esophageal varices with bleeding, thrombocytopenia, obesity status post gastric sleeve in January 2001, prior sessile serrated colon polyps in 2020 who is here for follow-up  1.  Decompensated NASH cirrhosis with portal hypertension/history of esophageal varices with hemorrhage, thrombocytopenia and splenomegaly --   Her labs are stable but her portal hypertension has been documented to be worse than her synthetic function.  She has also establish care with [facility] hepatology and she will follow with them as well.  At this point she is not in need of liver transplantation.  I am suspicious that she may have low-level hepatic encephalopathy which may explain some of her fatigue, tiredness/sleepiness and forgetfulness.  We discussed this today.  I am also going to check labs.  --[place] screening, up-to-date with MRI in September 2021, repeat in 6 to 12  months --Varices: Small at endoscopy in September, I had recommended 1 year surveillance with Dr. Loletha Grayer recommend 3 to 6 months.  We will plan repeat upper endoscopy in March with banding if needed; continue nadolol 20 mg daily --Check TSH and ferritin --Begin rifaximin 550 mg twice daily for low-grade hepatic encephalopathy --Continue low-sodium diet  2.  Upper abdominal pain and bloating symptom --repeat imaging did not show an issue with gallbladder or pancreas.  Perhaps rifaximin will help this symptom.  She is also on PPI --Continue pantoprazole 40 mg daily  3.  Nausea --continue to alternate promethazine 25 mg every 6 hours with ondansetron 4 to 8 mg every 8 hours.  Zofran may be causing some of her mild constipation or rather lack of loose stools. --Promethazine and ondansetron as above, will give more promethazine so that she will not run out before the end of 30 days  4.  History of sessile serrated colon polyp --follow-up colonoscopy recommended March 2025  2 to 6-monthfollow-up with me, sooner if needed

## 2020-05-08 ENCOUNTER — Telehealth: Payer: Self-pay | Admitting: *Deleted

## 2020-05-08 ENCOUNTER — Telehealth: Payer: Self-pay | Admitting: Internal Medicine

## 2020-05-08 ENCOUNTER — Other Ambulatory Visit: Payer: Self-pay

## 2020-05-08 DIAGNOSIS — R1010 Upper abdominal pain, unspecified: Secondary | ICD-10-CM

## 2020-05-08 DIAGNOSIS — K7460 Unspecified cirrhosis of liver: Secondary | ICD-10-CM

## 2020-05-08 MED FILL — traMADol HCL 50 MG TABS: 50 | 7 days supply | Qty: 30 | Fill #0

## 2020-05-08 NOTE — Telephone Encounter (Signed)
error 

## 2020-05-08 NOTE — Telephone Encounter (Signed)
Recent response made and sent to [facility], [place]

## 2020-05-08 NOTE — Telephone Encounter (Signed)
Lets have her come for cbc, cmp Complete abd [place], eval gallbladder and for ascites search Upper abd pain  Can try limited tramadol 50 mg q6h moderate to severe pain, #30  No more than 2 g tylenol in any 24 hours

## 2020-05-11 ENCOUNTER — Ambulatory Visit (INDEPENDENT_AMBULATORY_CARE_PROVIDER_SITE_OTHER): Payer: 59 | Admitting: Professional

## 2020-05-11 DIAGNOSIS — F331 Major depressive disorder, recurrent, moderate: Secondary | ICD-10-CM | POA: Diagnosis not present

## 2020-05-11 DIAGNOSIS — F411 Generalized anxiety disorder: Secondary | ICD-10-CM | POA: Diagnosis not present

## 2020-05-15 NOTE — Telephone Encounter (Signed)
Patient's [name] has been approved from 05/09/20-05/08/21. PA reference # [id number].

## 2020-05-17 ENCOUNTER — Ambulatory Visit (HOSPITAL_COMMUNITY): Admission: RE | Admit: 2020-05-17 | Payer: 59 | Source: Ambulatory Visit

## 2020-05-18 ENCOUNTER — Other Ambulatory Visit (INDEPENDENT_AMBULATORY_CARE_PROVIDER_SITE_OTHER): Payer: 59

## 2020-05-18 ENCOUNTER — Other Ambulatory Visit: Payer: Self-pay | Admitting: Allergy and Immunology

## 2020-05-18 DIAGNOSIS — K7460 Unspecified cirrhosis of liver: Secondary | ICD-10-CM

## 2020-05-18 DIAGNOSIS — R1010 Upper abdominal pain, unspecified: Secondary | ICD-10-CM

## 2020-05-18 LAB — COMPREHENSIVE METABOLIC PANEL
ALT: 20 U/L (ref 0–35)
AST: 25 U/L (ref 0–37)
Albumin: 4 g/dL (ref 3.5–5.2)
Alkaline Phosphatase: 79 U/L (ref 39–117)
BUN: 8 mg/dL (ref 6–23)
CO2: 27 mEq/L (ref 19–32)
Calcium: 9 mg/dL (ref 8.4–10.5)
Chloride: 106 mEq/L (ref 96–112)
Creatinine, Ser: 0.82 mg/dL (ref 0.40–1.20)
GFR: 97.96 mL/min (ref >60.00)
Glucose, Bld: 91 mg/dL (ref 70–99)
Potassium: 4.1 mEq/L (ref 3.5–5.1)
Sodium: 139 mEq/L (ref 135–145)
Total Bilirubin: 0.5 mg/dL (ref 0.2–1.2)
Total Protein: 6.5 g/dL (ref 6.0–8.3)

## 2020-05-18 LAB — CBC WITH DIFFERENTIAL/PLATELET
Basophils Absolute: 0 10*3/uL (ref 0.0–0.1)
Basophils Relative: 0.8 % (ref 0.0–3.0)
Eosinophils Absolute: 0.1 10*3/uL (ref 0.0–0.7)
Eosinophils Relative: 3.4 % (ref 0.0–5.0)
HCT: 41.1 % (ref 36.0–46.0)
Hemoglobin: 13.8 g/dL (ref 12.0–15.0)
Lymphocytes Relative: 36.9 % (ref 12.0–46.0)
Lymphs Abs: 1.5 10*3/uL (ref 0.7–4.0)
MCHC: 33.6 g/dL (ref 30.0–36.0)
MCV: 91.4 fl (ref 78.0–100.0)
Monocytes Absolute: 0.3 10*3/uL (ref 0.1–1.0)
Monocytes Relative: 8.1 % (ref 3.0–12.0)
Neutro Abs: 2.1 10*3/uL (ref 1.4–7.7)
Neutrophils Relative %: 50.8 % (ref 43.0–77.0)
Platelets: 59 10*3/uL — ABNORMAL LOW (ref 150.0–400.0)
RBC: 4.5 Mil/uL (ref 3.87–5.11)
RDW: 13.3 % (ref 11.5–15.5)
WBC: 4.2 10*3/uL (ref 4.0–10.5)

## 2020-05-18 MED FILL — ALBUTEROL SULFATE HFA 108 (: 108 (90 BAS | 16 days supply | Qty: 18 | Fill #0

## 2020-05-18 MED FILL — XIFAXAN 550 MG TABLET: 550 | 30 days supply | Qty: 60 | Fill #0

## 2020-05-18 MED FILL — PROMETHAZINE 25 MG TABLET: 25 | 20 days supply | Qty: 120 | Fill #0

## 2020-05-21 ENCOUNTER — Telehealth: Payer: Self-pay | Admitting: *Deleted

## 2020-05-21 ENCOUNTER — Other Ambulatory Visit: Payer: Self-pay | Admitting: Internal Medicine

## 2020-05-21 DIAGNOSIS — I8511 Secondary esophageal varices with bleeding: Secondary | ICD-10-CM

## 2020-05-21 DIAGNOSIS — K7460 Unspecified cirrhosis of liver: Secondary | ICD-10-CM

## 2020-05-21 DIAGNOSIS — K766 Portal hypertension: Secondary | ICD-10-CM

## 2020-05-21 NOTE — Telephone Encounter (Signed)
Patient has been scheduled for endoscopy with possible banding on 09/18/20 @ 8:30 am @ [facility] (no March hospital date available). She is scheduled for COVID screen at [facility] screening site on 09/14/20 at 2:00 pm. Patient will be instructed on this information at her upcoming appointment with Dr [name] in the office on 07/10/20.

## 2020-05-21 NOTE — Telephone Encounter (Signed)
-----   Message from Jerene Bears, MD sent at 05/01/2020  6:53 PM EST ----- She needs a hospital EGD for banding surveillance in March 2022

## 2020-05-25 ENCOUNTER — Telehealth (HOSPITAL_COMMUNITY): Payer: Self-pay | Admitting: Psychiatry

## 2020-05-25 ENCOUNTER — Ambulatory Visit (INDEPENDENT_AMBULATORY_CARE_PROVIDER_SITE_OTHER): Payer: 59 | Admitting: Professional

## 2020-05-25 DIAGNOSIS — F319 Bipolar disorder, unspecified: Secondary | ICD-10-CM

## 2020-05-25 DIAGNOSIS — F411 Generalized anxiety disorder: Secondary | ICD-10-CM | POA: Diagnosis not present

## 2020-05-25 DIAGNOSIS — F331 Major depressive disorder, recurrent, moderate: Secondary | ICD-10-CM

## 2020-05-25 MED FILL — GABAPENTIN 300 MG CAPSULE: 300 | 30 days supply | Qty: 120 | Fill #1

## 2020-05-25 NOTE — Telephone Encounter (Signed)
D:  Pt's therapist Francie Massing, [facility]) referred pt again to [place].  Juliann Pulse had referred pt about a month ago.  A:  Placed call to patient, but there was no answer.  Will contact pt on 05-28-20 @ 9:30 a.m. to complete the CCA.  Inform Juliann Pulse.

## 2020-05-28 ENCOUNTER — Other Ambulatory Visit (HOSPITAL_COMMUNITY): Payer: Self-pay

## 2020-05-28 ENCOUNTER — Other Ambulatory Visit (HOSPITAL_COMMUNITY): Payer: Self-pay | Attending: Psychiatry | Admitting: Family

## 2020-05-28 DIAGNOSIS — F419 Anxiety disorder, unspecified: Secondary | ICD-10-CM | POA: Insufficient documentation

## 2020-05-28 DIAGNOSIS — Z888 Allergy status to other drugs, medicaments and biological substances status: Secondary | ICD-10-CM | POA: Insufficient documentation

## 2020-05-28 DIAGNOSIS — Z880 Allergy status to penicillin: Secondary | ICD-10-CM | POA: Insufficient documentation

## 2020-05-28 DIAGNOSIS — Z79899 Other long term (current) drug therapy: Secondary | ICD-10-CM | POA: Insufficient documentation

## 2020-05-28 DIAGNOSIS — F4329 Adjustment disorder with other symptoms: Secondary | ICD-10-CM | POA: Insufficient documentation

## 2020-05-28 DIAGNOSIS — F322 Major depressive disorder, single episode, severe without psychotic features: Secondary | ICD-10-CM

## 2020-05-28 DIAGNOSIS — F3181 Bipolar II disorder: Secondary | ICD-10-CM | POA: Insufficient documentation

## 2020-05-28 DIAGNOSIS — F4310 Post-traumatic stress disorder, unspecified: Secondary | ICD-10-CM | POA: Diagnosis not present

## 2020-05-28 DIAGNOSIS — F408 Other phobic anxiety disorders: Secondary | ICD-10-CM | POA: Diagnosis not present

## 2020-05-28 DIAGNOSIS — F319 Bipolar disorder, unspecified: Secondary | ICD-10-CM | POA: Diagnosis not present

## 2020-05-28 DIAGNOSIS — F5104 Psychophysiologic insomnia: Secondary | ICD-10-CM | POA: Diagnosis not present

## 2020-05-28 DIAGNOSIS — F429 Obsessive-compulsive disorder, unspecified: Secondary | ICD-10-CM | POA: Diagnosis not present

## 2020-05-28 DIAGNOSIS — F5081 Binge eating disorder: Secondary | ICD-10-CM | POA: Diagnosis not present
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
## 2020-05-29 ENCOUNTER — Encounter (HOSPITAL_COMMUNITY): Payer: Self-pay | Admitting: Psychiatry

## 2020-05-29 ENCOUNTER — Other Ambulatory Visit (HOSPITAL_COMMUNITY): Payer: 59 | Admitting: Psychiatry

## 2020-05-29 ENCOUNTER — Other Ambulatory Visit: Payer: Self-pay

## 2020-05-29 ENCOUNTER — Encounter (HOSPITAL_COMMUNITY): Payer: Self-pay | Admitting: Family

## 2020-05-29 DIAGNOSIS — F322 Major depressive disorder, single episode, severe without psychotic features: Secondary | ICD-10-CM

## 2020-05-29 DIAGNOSIS — F419 Anxiety disorder, unspecified: Secondary | ICD-10-CM | POA: Diagnosis not present

## 2020-05-29 DIAGNOSIS — F3181 Bipolar II disorder: Secondary | ICD-10-CM | POA: Diagnosis not present

## 2020-05-29 DIAGNOSIS — Z888 Allergy status to other drugs, medicaments and biological substances status: Secondary | ICD-10-CM | POA: Diagnosis not present

## 2020-05-29 DIAGNOSIS — Z79899 Other long term (current) drug therapy: Secondary | ICD-10-CM | POA: Diagnosis not present

## 2020-05-29 DIAGNOSIS — Z880 Allergy status to penicillin: Secondary | ICD-10-CM | POA: Diagnosis not present

## 2020-05-29 DIAGNOSIS — F32A Depression, unspecified: Secondary | ICD-10-CM | POA: Diagnosis present

## 2020-05-29 DIAGNOSIS — F4329 Adjustment disorder with other symptoms: Secondary | ICD-10-CM | POA: Diagnosis not present

## 2020-05-29 MED FILL — LAMOTRIGINE 100 MG TABS: 100 | 30 days supply | Qty: 60 | Fill #0

## 2020-05-29 MED FILL — LORazepam 0.5 MG TABS: 0.5 | 30 days supply | Qty: 60 | Fill #0

## 2020-05-29 MED FILL — ARIPIPRAZOLE 10 MG TABS: 10 | 30 days supply | Qty: 30 | Fill #0

## 2020-05-29 MED FILL — FLUoxetine HCL 20 MG CAPS: 20 | 30 days supply | Qty: 60 | Fill #0
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
## 2020-05-29 NOTE — Patient Instructions (Signed)
Start IOP

## 2020-05-30 ENCOUNTER — Other Ambulatory Visit: Payer: Self-pay

## 2020-05-30 ENCOUNTER — Telehealth (HOSPITAL_COMMUNITY): Payer: Self-pay | Admitting: Psychiatry

## 2020-05-30 ENCOUNTER — Other Ambulatory Visit (HOSPITAL_COMMUNITY): Payer: 59 | Admitting: Psychiatry

## 2020-05-30 NOTE — Telephone Encounter (Signed)
D:  Patient called and informed the group leader that she can't attend MH-IOP today d/t her mother falling and breaking her hip and possibly some other things.  Pt plans to return to virtual group tomorrow.  A:  Inform treatment team.

## 2020-05-31 ENCOUNTER — Other Ambulatory Visit: Payer: Self-pay

## 2020-05-31 ENCOUNTER — Telehealth: Payer: Self-pay | Admitting: Internal Medicine

## 2020-05-31 ENCOUNTER — Other Ambulatory Visit (HOSPITAL_COMMUNITY): Payer: 59 | Admitting: Psychiatry

## 2020-05-31 ENCOUNTER — Encounter (HOSPITAL_COMMUNITY): Payer: Self-pay

## 2020-05-31 DIAGNOSIS — Z79899 Other long term (current) drug therapy: Secondary | ICD-10-CM | POA: Diagnosis not present

## 2020-05-31 DIAGNOSIS — F322 Major depressive disorder, single episode, severe without psychotic features: Secondary | ICD-10-CM

## 2020-05-31 DIAGNOSIS — Z880 Allergy status to penicillin: Secondary | ICD-10-CM | POA: Diagnosis not present

## 2020-05-31 DIAGNOSIS — F4329 Adjustment disorder with other symptoms: Secondary | ICD-10-CM | POA: Diagnosis not present

## 2020-05-31 DIAGNOSIS — Z888 Allergy status to other drugs, medicaments and biological substances status: Secondary | ICD-10-CM | POA: Diagnosis not present

## 2020-05-31 DIAGNOSIS — F419 Anxiety disorder, unspecified: Secondary | ICD-10-CM | POA: Diagnosis not present

## 2020-05-31 DIAGNOSIS — F3181 Bipolar II disorder: Secondary | ICD-10-CM | POA: Diagnosis not present

## 2020-05-31 NOTE — Telephone Encounter (Signed)
Patient calling to check on disability paperwork is asking if they were yet received and what the cost is to start the process please advise

## 2020-05-31 NOTE — Progress Notes (Signed)
Virtual Visit via Video Note  I connected with [name] on 05/31/20 at  9:00 AM EST by a video enabled telemedicine application and verified that I am speaking with the correct person using two identifiers.  At orientation to the IOP program, Case Manager discussed the limitations of evaluation and management by telemedicine and the availability of in person appointments. The patient expressed understanding and agreed to proceed with virtual visits throughout the duration of the program.   Location:  Patient: Patient Home Provider: Home Office   History of Present Illness: MDD  Observations/Objective: Check In: Case Manager checked in with all participants to review discharge dates, insurance authorizations, work-related documents and needs from the treatment team. Client stated needs and engaged in discussion. Counselor facilitated a group processing with group members to assess mood and current functioning. Client reports that she is experiencing increased life stressors related to caregiving responsibilities of her mother who has a terminal illness. Client discussed challenges in her marital relationship, seeking guidance on communication skills and connecting to resources for husband. Client presents with severe depression and severe anxiety. Client denied any current SI/HI/psychosis.    Initial Therapeutic Activity: Counselor introduced [employer], [place] Chaplain to present information and discussion on Grief and Loss. Group members engaged in discussion, sharing how grief impacts them, what comforts them, what emotions are felt, labeling losses, etc. After guest speaker logged off, Counselor prompted group to spend 10-15 minutes journaling to process personal grief and loss situations. Counselor processed entries with group and client's identified areas for additional processing in individual therapy. Client noted issues of anticipatory grief of her mother passing and in how  she has grieved the loss of her son. Client identifies the need to slow down to process events and emotions with a professional.    Second Therapeutic Activity: Counselor engaged group in the topic Self-Compassion, as it came up with in the context of allowing self to grieve. Counselor provided a link for group to take a self-compassion self-assessment. Client scored 1.82 out of 5. Counselor provided psychoeducation on topic using leading research by Dr. Cristie Hem. Client discussed need to release self from guilt of extending compassion and negative thoughts about self.   Check Out: Counselor closed group by checking in with Clients to determine what self-care practice or productivity activity they can engage in today to alleviate stress/anxiety. Client plans to watch a movie in theaters this afternoon for "me time." Client endorsed safety plan to be followed to prevent safety issues.  Assessment and Plan: Clinician recommends that Client remain in IOP treatment to better manage mental health symptoms, stabilization and to address treatment plan goals. Clinician recommends adherence to crisis/safety plan, taking medications as prescribed, and following up with medical professionals if any issues arise.   Follow Up Instructions: Clinician will send Webex link for next session. The Client was advised to call back or seek an in-person evaluation if the symptoms worsen or if the condition fails to improve as anticipated.     I provided 180 minutes of non-face-to-face time during this encounter.     Lise Auer, LCSW

## 2020-06-01 ENCOUNTER — Encounter (HOSPITAL_COMMUNITY): Payer: Self-pay

## 2020-06-01 ENCOUNTER — Other Ambulatory Visit (HOSPITAL_COMMUNITY): Payer: 59 | Admitting: Psychiatry

## 2020-06-01 ENCOUNTER — Other Ambulatory Visit: Payer: Self-pay

## 2020-06-01 ENCOUNTER — Ambulatory Visit: Payer: 59 | Admitting: Professional

## 2020-06-01 DIAGNOSIS — Z79899 Other long term (current) drug therapy: Secondary | ICD-10-CM | POA: Diagnosis not present

## 2020-06-01 DIAGNOSIS — Z880 Allergy status to penicillin: Secondary | ICD-10-CM | POA: Diagnosis not present

## 2020-06-01 DIAGNOSIS — F3181 Bipolar II disorder: Secondary | ICD-10-CM | POA: Diagnosis not present

## 2020-06-01 DIAGNOSIS — F4329 Adjustment disorder with other symptoms: Secondary | ICD-10-CM | POA: Diagnosis not present

## 2020-06-01 DIAGNOSIS — F419 Anxiety disorder, unspecified: Secondary | ICD-10-CM | POA: Diagnosis not present

## 2020-06-01 DIAGNOSIS — F322 Major depressive disorder, single episode, severe without psychotic features: Secondary | ICD-10-CM

## 2020-06-01 DIAGNOSIS — Z888 Allergy status to other drugs, medicaments and biological substances status: Secondary | ICD-10-CM | POA: Diagnosis not present

## 2020-06-01 NOTE — Progress Notes (Signed)
Virtual Visit via Video Note  I connected with Linda Becker on 06/01/20 at  9:00 AM EST by a video enabled telemedicine application and verified that I am speaking with the correct person using two identifiers.  At orientation to the IOP program, Case Manager discussed the limitations of evaluation and management by telemedicine and the availability of in person appointments. The patient expressed understanding and agreed to proceed with virtual visits throughout the duration of the program.   Location:  Patient: Patient Home Provider: Home Office   History of Present Illness: MDD  Observations/Objective: Check In: Case Manager checked in with all participants to review discharge dates, insurance authorizations, work-related documents and needs from the treatment team.     Initial Therapeutic Activity: Client stated needs and engaged in discussion. Counselor facilitated a group processing with group members to assess mood and current functioning. Client reports that she is dealing with significant life stressors, feeling helpless, hopeless, triggered, overwhelmed and exhausted. Client requested feedback and empathetic listening about issues related to decision making, marital relationship, end of life issues with her mom and difficult interactions with family members related to her mothers care. Client self-reports issues with co-dependency and medical issues. After discussing strategies Client's acute presentation of mood regulated. Client presents with severe depression and severe anxiety. Client denied any current SI/HI/psychosis.    Second Therapeutic Activity: Counselor reviewed information covered regarding application skills of Self-Compassion. Counselor provided additional psychoeducation on topic using leading research by Dr. Cristie Hem. Counselor walked group through the self-soothing touch and self-soothing affirmations emphasizing kindness, validation and relaxation. Client  stated that the practice was relaxing and comforting.   Check Out: Counselor closed group by checking in with Clients to determine what self-care practice or productivity activity they can engage over the weekend to alleviate stress/anxiety. Client plans to attend a caregiver meeting about her mom, will have a candid discussion with husband about future needs and boundaries and will care out some "me time" by blocking specific  Client endorsed safety plan to be followed to prevent safety issues.  Assessment and Plan: Clinician recommends that Client remain in IOP treatment to better manage mental health symptoms, stabilization and to address treatment plan goals. Clinician recommends adherence to crisis/safety plan, taking medications as prescribed, and following up with medical professionals if any issues arise.   Follow Up Instructions: Clinician will send Webex link for next session. The Client was advised to call back or seek an in-person evaluation if the symptoms worsen or if the condition fails to improve as anticipated.     I provided 180 minutes of non-face-to-face time during this encounter.     Lise Auer, LCSW

## 2020-06-01 NOTE — Telephone Encounter (Signed)
I have spoken to patient to advise that we do have her paperwork from [facility] and [facility]. Dr [name] is filling out the appropriate forms and we will let her know when these are ready. She verbalizes understanding.  Of note: she indicates that she still remains confused, has abdominal pain and is having continued body aches/pains despite tramadol usage.

## 2020-06-01 NOTE — Telephone Encounter (Signed)
Dr [name] has completed appropriate forms and signed them. I have placed 6 pages of [place] forms and 5 pages of [employer] paperwork as well as associated office visits at the front desk for Linda Becker, our front desk coordinator. She will contact patient to arrange for pick up/billing etc.

## 2020-06-04 ENCOUNTER — Telehealth (HOSPITAL_COMMUNITY): Payer: Self-pay | Admitting: Psychiatry

## 2020-06-04 ENCOUNTER — Other Ambulatory Visit (HOSPITAL_COMMUNITY): Payer: 59 | Admitting: Psychiatry

## 2020-06-04 ENCOUNTER — Other Ambulatory Visit: Payer: Self-pay

## 2020-06-04 NOTE — Telephone Encounter (Signed)
D:  Pt had logged into virtual group for MH-IOP this morning, stating that she didn't know how long she would be able to stay in group d/t her mother currently dying in Hospice. Pt was crying.   A:  Provided pt with support and excused pt from group today in order to spend time with her mother.  R:  Pt receptive.

## 2020-06-05 ENCOUNTER — Other Ambulatory Visit: Payer: Self-pay

## 2020-06-05 ENCOUNTER — Other Ambulatory Visit (HOSPITAL_COMMUNITY): Payer: 59

## 2020-06-06 ENCOUNTER — Other Ambulatory Visit (HOSPITAL_COMMUNITY): Payer: 59

## 2020-06-06 ENCOUNTER — Other Ambulatory Visit: Payer: Self-pay

## 2020-06-06 ENCOUNTER — Ambulatory Visit: Payer: 59 | Admitting: Professional

## 2020-06-07 ENCOUNTER — Other Ambulatory Visit: Payer: Self-pay

## 2020-06-07 ENCOUNTER — Telehealth: Payer: Self-pay | Admitting: Internal Medicine

## 2020-06-07 ENCOUNTER — Telehealth (HOSPITAL_COMMUNITY): Payer: Self-pay | Admitting: Psychiatry

## 2020-06-07 ENCOUNTER — Telehealth: Payer: Self-pay

## 2020-06-07 ENCOUNTER — Other Ambulatory Visit (HOSPITAL_COMMUNITY): Payer: 59 | Admitting: Psychiatry

## 2020-06-07 MED ORDER — TRAMADOL HCL 50 MG PO TABS
ORAL_TABLET | ORAL | 0 refills | Status: DC
Start: 2020-06-07 — End: 2020-06-07

## 2020-06-07 MED ORDER — TRAMADOL HCL 50 MG PO TABS: ORAL_TABLET | ORAL | 0 refills | Status: DC

## 2020-06-07 NOTE — Telephone Encounter (Signed)
Attempted to call pt back but received message that voicemail box is full and cannot accept messages at this time. Will try again later.

## 2020-06-07 NOTE — Telephone Encounter (Signed)
Pt stated she had been having problems rescheduling the [place]. [place] scheduled at [facility] 06/22/20@8 :30am. Pt to arrive there at 8:15am and be NPO after midnight. Pt aware.

## 2020-06-07 NOTE — Telephone Encounter (Signed)
Patient's pharmacy, [facility] indicates that they do not have tramadol in stock at this moment and will need to order it. Patient requests rx be sent to [facility] instead. I have contacted [facility]ist at [facility] and have given verbal order.  In addition, patient states that [place] tells her we did not send office notes with disability papers (14 pages of office notes including 05-01-20 office notes were sent). I have resent these notes at her request.  I advised that I have not seen any requests from [place] come through. She verbalizes understanding of this information.

## 2020-06-07 NOTE — Telephone Encounter (Signed)
Pt called and states she needs some papers filled out, states we should have received disability paperwork from [facility] that needs to be filled out and sent in asap.  The other papers should be from [employer] for her critical illness policy for Organ/liver failure. States they ask about her being worked up for liver transplant in the future. Reports this would provide a lump sum of money she can use toward medical bills.   Pt also requested a refill on tramadol This was ok'd by Dr. [name] and called to [facility].   Dottie or Dr. [name] have you seen these forms?

## 2020-06-07 NOTE — Telephone Encounter (Signed)
D:  Placed call to check in on pt.  Pt states that her mother died and the funeral is next Wed. 06-13-20.  A:  Provided pt with support.  Will excuse pt from MH-IOP all this week and through next Wednesday.  Pt agreed to return to [place] on 06-14-20.  Inform Francie Massing, [facility].

## 2020-06-08 ENCOUNTER — Other Ambulatory Visit: Payer: Self-pay

## 2020-06-08 ENCOUNTER — Other Ambulatory Visit (HOSPITAL_COMMUNITY): Payer: 59

## 2020-06-11 MED FILL — ONDANSETRON ODT 8 MG TABLET: 8 | 10 days supply | Qty: 30 | Fill #1

## 2020-06-11 MED FILL — PROMETHAZINE 25 MG TABLET: 25 | 20 days supply | Qty: 120 | Fill #1

## 2020-06-11 NOTE — Telephone Encounter (Signed)
Pt is requesting a call back from a nurse to discuss the forms that she needs for her disability.

## 2020-06-12 ENCOUNTER — Other Ambulatory Visit: Payer: Self-pay

## 2020-06-12 ENCOUNTER — Telehealth: Payer: Self-pay | Admitting: Internal Medicine

## 2020-06-12 ENCOUNTER — Other Ambulatory Visit (HOSPITAL_COMMUNITY): Payer: 59

## 2020-06-12 DIAGNOSIS — K7460 Unspecified cirrhosis of liver: Secondary | ICD-10-CM

## 2020-06-12 DIAGNOSIS — R1010 Upper abdominal pain, unspecified: Secondary | ICD-10-CM

## 2020-06-12 DIAGNOSIS — M2550 Pain in unspecified joint: Secondary | ICD-10-CM

## 2020-06-12 NOTE — Telephone Encounter (Signed)
Her pain is a challenging situation because I simply do not have an explanation for her abdominal pain as well as diffuse muscle and joint pains. No etiology for this pain has been discovered on endoscopy or cross-sectional imaging She does not have ascites and therefore there is no peritoneal fluid to sample. This could be something rheumatologic or possibly even a side effect of medication she is already taking.  My recommendation would be that we consider rheumatology referral but I would check autoantibody first which we can do here. I would also encourage her to discuss her psychiatric medications with her mental health providers specifically Lamictal, Abilify, gabapentin and Topamax.  Perhaps changing to a medication or reducing medications will provide pain relief.  Some psychiatric medications such as Cymbalta can help with diffuse pain.  I would defer this to her mental health providers.  She can come for CBC, CMP, INR, ANA, CK, ESR and CRP --we did labs about 1 month ago which were stable but we did not check inflammatory markers or autoantibodies.  CK was normal a couple months ago.  I am sorry that she is having such severe pain we will continue to try to understand this so we know how to treat it.  I also learned that her mother died recently and I am sorry to hear this as well.

## 2020-06-12 NOTE — Telephone Encounter (Signed)
See previous phone note dated 06/11/20

## 2020-06-12 NOTE — Telephone Encounter (Signed)
I have spoken to patient who indicates that she will be sending [employer] forms over for Dr [name] to review and fill out if appropriate.

## 2020-06-12 NOTE — Telephone Encounter (Signed)
Left message for patient to call back  

## 2020-06-13 ENCOUNTER — Other Ambulatory Visit (HOSPITAL_COMMUNITY): Payer: 59

## 2020-06-13 ENCOUNTER — Other Ambulatory Visit: Payer: Self-pay

## 2020-06-13 NOTE — Telephone Encounter (Signed)
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

## 2020-06-14 ENCOUNTER — Other Ambulatory Visit: Payer: Self-pay | Admitting: Internal Medicine

## 2020-06-14 ENCOUNTER — Other Ambulatory Visit: Payer: Self-pay

## 2020-06-14 ENCOUNTER — Other Ambulatory Visit (HOSPITAL_COMMUNITY): Payer: 59 | Admitting: Licensed Clinical Social Worker

## 2020-06-14 DIAGNOSIS — F322 Major depressive disorder, single episode, severe without psychotic features: Secondary | ICD-10-CM

## 2020-06-14 MED ORDER — FLUCONAZOLE 100 MG PO TABS: ORAL_TABLET | ORAL | 0 refills | Status: DC

## 2020-06-14 MED FILL — FLUCONAZOLE 100 MG TAB: 100 | 14 days supply | Qty: 15 | Fill #0

## 2020-06-15 ENCOUNTER — Ambulatory Visit: Payer: 59 | Admitting: Professional

## 2020-06-18 ENCOUNTER — Other Ambulatory Visit (HOSPITAL_COMMUNITY): Payer: 59 | Admitting: Psychiatry

## 2020-06-18 ENCOUNTER — Other Ambulatory Visit: Payer: Self-pay

## 2020-06-18 ENCOUNTER — Telehealth (HOSPITAL_COMMUNITY): Payer: Self-pay | Admitting: Psychiatry

## 2020-06-19 ENCOUNTER — Other Ambulatory Visit: Payer: Self-pay | Admitting: Internal Medicine

## 2020-06-19 ENCOUNTER — Other Ambulatory Visit: Payer: Self-pay | Admitting: Allergy and Immunology

## 2020-06-19 ENCOUNTER — Other Ambulatory Visit: Payer: Self-pay

## 2020-06-19 ENCOUNTER — Other Ambulatory Visit (HOSPITAL_COMMUNITY): Payer: 59 | Attending: Psychiatry | Admitting: Licensed Clinical Social Worker

## 2020-06-19 DIAGNOSIS — F322 Major depressive disorder, single episode, severe without psychotic features: Secondary | ICD-10-CM | POA: Insufficient documentation

## 2020-06-19 MED FILL — GABAPENTIN 300 MG CAPSULE: 300 | 30 days supply | Qty: 120 | Fill #0

## 2020-06-19 NOTE — Telephone Encounter (Signed)
Rx sent 06/07/20 for #30. Sig written as: Take 1-2 tablets by mouth every 6 hours as needed.  Dr [name], we are getting another rx request for tramadol....please advise.

## 2020-06-19 NOTE — Telephone Encounter (Signed)
Ok to refill 

## 2020-06-20 ENCOUNTER — Other Ambulatory Visit (HOSPITAL_COMMUNITY): Payer: 59 | Admitting: Psychiatry

## 2020-06-20 ENCOUNTER — Other Ambulatory Visit: Payer: Self-pay | Admitting: Internal Medicine

## 2020-06-20 ENCOUNTER — Other Ambulatory Visit: Payer: Self-pay

## 2020-06-20 ENCOUNTER — Telehealth (HOSPITAL_COMMUNITY): Payer: Self-pay | Admitting: Psychiatry

## 2020-06-20 ENCOUNTER — Telehealth: Payer: Self-pay | Admitting: *Deleted

## 2020-06-20 MED FILL — traMADol HCL 50 MG TABS: 50 | 7 days supply | Qty: 30 | Fill #0

## 2020-06-20 NOTE — Telephone Encounter (Signed)
D:  Pt didn't log into group this a.m so a call was made to check on her.  According to pt, she has a doctor's appointment d/t an upcoming procedure this week.  Pt states she hopes to be in group tomorrow.  A:  Encouraged pt to call when she's not going to join group in the future.  Inform treatment team.  R:  Pt receptive.

## 2020-06-20 NOTE — Telephone Encounter (Signed)
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 [employer] Critical Illness Claim forms per Dr [name] cannot be filled out at the moment because: "currently significant portal hypertension, but not acute liver failure or need for urgent transplant to prolong life. Thus per my interpretation, no above condition to check yes. Not currently listed for organ transplant."  Patient verbalizes understanding of this information.

## 2020-06-21 ENCOUNTER — Ambulatory Visit (HOSPITAL_COMMUNITY): Payer: 59

## 2020-06-21 DIAGNOSIS — F4310 Post-traumatic stress disorder, unspecified: Secondary | ICD-10-CM | POA: Diagnosis not present

## 2020-06-21 DIAGNOSIS — F5104 Psychophysiologic insomnia: Secondary | ICD-10-CM | POA: Diagnosis not present

## 2020-06-21 DIAGNOSIS — F319 Bipolar disorder, unspecified: Secondary | ICD-10-CM | POA: Diagnosis not present

## 2020-06-21 DIAGNOSIS — F5081 Binge eating disorder: Secondary | ICD-10-CM | POA: Diagnosis not present

## 2020-06-21 DIAGNOSIS — F429 Obsessive-compulsive disorder, unspecified: Secondary | ICD-10-CM | POA: Diagnosis not present

## 2020-06-22 ENCOUNTER — Ambulatory Visit (HOSPITAL_COMMUNITY): Payer: 59

## 2020-06-22 ENCOUNTER — Other Ambulatory Visit: Payer: Self-pay

## 2020-06-22 ENCOUNTER — Other Ambulatory Visit (INDEPENDENT_AMBULATORY_CARE_PROVIDER_SITE_OTHER): Payer: 59

## 2020-06-22 ENCOUNTER — Telehealth (HOSPITAL_COMMUNITY): Payer: Self-pay | Admitting: Psychiatry

## 2020-06-22 ENCOUNTER — Ambulatory Visit: Payer: 59 | Admitting: Professional

## 2020-06-22 ENCOUNTER — Ambulatory Visit (HOSPITAL_COMMUNITY)
Admission: RE | Admit: 2020-06-22 | Discharge: 2020-06-22 | Disposition: A | Discharge status: Home / Self Care | Payer: 59 | Source: Ambulatory Visit | Attending: Internal Medicine | Admitting: Internal Medicine

## 2020-06-22 DIAGNOSIS — K7460 Unspecified cirrhosis of liver: Secondary | ICD-10-CM

## 2020-06-22 DIAGNOSIS — R1010 Upper abdominal pain, unspecified: Secondary | ICD-10-CM | POA: Diagnosis not present

## 2020-06-22 DIAGNOSIS — M2550 Pain in unspecified joint: Secondary | ICD-10-CM

## 2020-06-22 DIAGNOSIS — R161 Splenomegaly, not elsewhere classified: Secondary | ICD-10-CM | POA: Diagnosis not present

## 2020-06-22 LAB — COMPREHENSIVE METABOLIC PANEL
ALT: 13 U/L (ref 0–35)
AST: 18 U/L (ref 0–37)
Albumin: 4.2 g/dL (ref 3.5–5.2)
Alkaline Phosphatase: 87 U/L (ref 39–117)
BUN: 11 mg/dL (ref 6–23)
CO2: 29 mEq/L (ref 19–32)
Calcium: 9 mg/dL (ref 8.4–10.5)
Chloride: 107 mEq/L (ref 96–112)
Creatinine, Ser: 0.8 mg/dL (ref 0.40–1.20)
GFR: 100.84 mL/min (ref >60.00)
Glucose, Bld: 90 mg/dL (ref 70–99)
Potassium: 4.5 mEq/L (ref 3.5–5.1)
Sodium: 140 mEq/L (ref 135–145)
Total Bilirubin: 0.6 mg/dL (ref 0.2–1.2)
Total Protein: 6.6 g/dL (ref 6.0–8.3)

## 2020-06-22 LAB — CBC WITH DIFFERENTIAL/PLATELET
Basophils Absolute: 0 10*3/uL (ref 0.0–0.1)
Basophils Relative: 0.5 % (ref 0.0–3.0)
Eosinophils Absolute: 0.1 10*3/uL (ref 0.0–0.7)
Eosinophils Relative: 3.7 % (ref 0.0–5.0)
HCT: 40 % (ref 36.0–46.0)
Hemoglobin: 13.5 g/dL (ref 12.0–15.0)
Lymphocytes Relative: 37.1 % (ref 12.0–46.0)
Lymphs Abs: 1 10*3/uL (ref 0.7–4.0)
MCHC: 33.7 g/dL (ref 30.0–36.0)
MCV: 91.1 fl (ref 78.0–100.0)
Monocytes Absolute: 0.1 10*3/uL (ref 0.1–1.0)
Monocytes Relative: 4.8 % (ref 3.0–12.0)
Neutro Abs: 1.5 10*3/uL (ref 1.4–7.7)
Neutrophils Relative %: 53.9 % (ref 43.0–77.0)
Platelets: 61 10*3/uL — ABNORMAL LOW (ref 150.0–400.0)
RBC: 4.39 Mil/uL (ref 3.87–5.11)
RDW: 14.3 % (ref 11.5–15.5)
WBC: 2.8 10*3/uL — ABNORMAL LOW (ref 4.0–10.5)

## 2020-06-22 LAB — PROTIME-INR
INR: 1.2 ratio — ABNORMAL HIGH (ref 0.8–1.0)
Prothrombin Time: 13.6 s — ABNORMAL HIGH (ref 9.6–13.1)

## 2020-06-22 LAB — C-REACTIVE PROTEIN: CRP: <1 mg/dL (ref 0.5–20.0)

## 2020-06-22 LAB — CK: Total CK: 30 U/L (ref 7–177)

## 2020-06-22 LAB — SEDIMENTATION RATE: Sed Rate: 10 mm/hr (ref 0–20)

## 2020-06-22 NOTE — Telephone Encounter (Signed)
D:  Pt had communicated via email to the group leader on this past Wednesday that she had a doctor's appt and another procedure but she should be able to return to virtual MH-IOP today.  Pt didn't attend nor did she contact group leader or case manager.  A:  Will attempt to reach out to pt.  Inform treatment team; along with pt's therapist.  Will discuss patient's lack of attendance and options if she feels that she can't continue in [place].  Pt only attended one day this week and one day last week.

## 2020-06-25 ENCOUNTER — Other Ambulatory Visit (HOSPITAL_COMMUNITY): Payer: 59 | Admitting: Family

## 2020-06-25 ENCOUNTER — Other Ambulatory Visit: Payer: Self-pay

## 2020-06-25 ENCOUNTER — Encounter (HOSPITAL_COMMUNITY): Payer: Self-pay

## 2020-06-25 DIAGNOSIS — Z0279 Encounter for issue of other medical certificate: Secondary | ICD-10-CM

## 2020-06-25 DIAGNOSIS — F322 Major depressive disorder, single episode, severe without psychotic features: Secondary | ICD-10-CM

## 2020-06-25 NOTE — Progress Notes (Signed)
Virtual Visit via Video Note  I connected with Linda Becker on 06/25/20 at  9:00 AM EST by a video enabled telemedicine application and verified that I am speaking with the correct person using two identifiers.  At orientation to the IOP program, Case Manager discussed the limitations of evaluation and management by telemedicine and the availability of in person appointments. The patient expressed understanding and agreed to proceed with virtual visits throughout the duration of the program.   Location:  Patient: Patient Home Provider: Home Office   History of Present Illness: MDD  Observations/Objective: Check In: Case Manager checked in with all participants to review discharge dates, insurance authorizations, work-related documents and needs from the treatment team. Client stated needs and engaged in discussion. Case Manager introduced new group members and allowed group to welcome and start the joining process.    Initial Therapeutic Activity: Counselor facilitated a group processing with group members to assess mood and current functioning. Client reports that she has been working to cope with the passing of her mother and the responsibilities she held in being her primary caregiver. Client experiencing increased life stressors. Client noted that she reconnected with some supports, and have found benefits in spending time with them. Client presents with severe depression and severe anxiety. Client denied any current SI/HI/psychosis.  Second Therapeutic Activity: Counselor provided group with psychoeducation about the application and benefits of Mindfulness. Counselor engaged group in discussion about baseline understanding of mindfulness. Counselor reviewed an article and handout that outlined definition, facts and mindfulness exercises. Group engaged in the practices as Counselor guided a meditation, body scan and mindful eating. Client noted that it is difficult to apply these  practices due to restlessness and agitations.Counselor encouraged client to practice skills when she is calm and can focus, in order to utilize in times of crisis.  Check Out: Counselor prompted group members to identify one self-care practice or productivity activity they would like to engage in today. Client plans to take a nap and clean her home. Client endorsed safety plan to be followed to prevent safety issues.  Assessment and Plan: Clinician recommends that Client remain in IOP treatment to better manage mental health symptoms, stabilization and to address treatment plan goals. Clinician recommends adherence to crisis/safety plan, taking medications as prescribed, and following up with medical professionals if any issues arise.   Follow Up Instructions: Clinician will send Webex link for next session. The Client was advised to call back or seek an in-person evaluation if the symptoms worsen or if the condition fails to improve as anticipated.     I provided 180 minutes of non-face-to-face time during this encounter.     Lise Auer, LCSW

## 2020-06-26 ENCOUNTER — Other Ambulatory Visit (HOSPITAL_COMMUNITY): Payer: 59 | Admitting: Psychiatry

## 2020-06-26 ENCOUNTER — Telehealth (HOSPITAL_COMMUNITY): Payer: Self-pay | Admitting: Psychiatry

## 2020-06-26 ENCOUNTER — Other Ambulatory Visit: Payer: Self-pay

## 2020-06-26 MED FILL — LAMOTRIGINE 100 MG TABS: 100 | 30 days supply | Qty: 60 | Fill #1

## 2020-06-26 MED FILL — ARIPIPRAZOLE 10 MG TABS: 10 | 30 days supply | Qty: 30 | Fill #1

## 2020-06-26 MED FILL — LORazepam 0.5 MG TABS: 0.5 | 30 days supply | Qty: 60 | Fill #1

## 2020-06-26 MED FILL — FLUoxetine HCL 20 MG CAPS: 20 | 30 days supply | Qty: 60 | Fill #1

## 2020-06-27 ENCOUNTER — Telehealth (HOSPITAL_COMMUNITY): Payer: Self-pay | Admitting: Psychiatry

## 2020-06-27 ENCOUNTER — Other Ambulatory Visit (HOSPITAL_COMMUNITY): Payer: 59 | Admitting: Psychiatry

## 2020-06-27 ENCOUNTER — Other Ambulatory Visit: Payer: Self-pay

## 2020-06-27 NOTE — Telephone Encounter (Signed)
D:  Pt sent case manager another email this morning, stating that she wouldn't be attending virtual MH-IOP.  According to pt, she is still with her father at the hospital.  A:  Informed pt's therapist Juliann Pulse).  Treatment team decided that pt will be discharged on 06-29-20 d/t multiple absences.  Responded back to pt's email requesting her to contact case manager today.

## 2020-06-28 ENCOUNTER — Other Ambulatory Visit (HOSPITAL_COMMUNITY): Payer: 59

## 2020-06-28 ENCOUNTER — Other Ambulatory Visit (HOSPITAL_COMMUNITY): Payer: Self-pay

## 2020-06-28 ENCOUNTER — Other Ambulatory Visit: Payer: Self-pay

## 2020-06-28 LAB — ANA: Anti Nuclear Antibody (ANA): NEGATIVE

## 2020-06-28 MED FILL — GABAPENTIN 300 MG CAPSULE: 300 | 30 days supply | Qty: 180 | Fill #0

## 2020-06-29 ENCOUNTER — Ambulatory Visit: Payer: 59 | Admitting: Professional
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
## 2020-06-30 NOTE — Progress Notes (Signed)
Virtual Visit via Video Note  I connected with Linda Becker on 06/19/20 at  9:00 AM EST by a video enabled telemedicine application and verified that I am speaking with the correct person using two identifiers.  Location: Patient: patient home Provider: clinical home office   I discussed the limitations of evaluation and management by telemedicine and the availability of in person appointments. The patient expressed understanding and agreed to proceed.  I discussed the assessment and treatment plan with the patient. The patient was provided an opportunity to ask questions and all were answered. The patient agreed with the plan and demonstrated an understanding of the instructions.   The patient was advised to call back or seek an in-person evaluation if the symptoms worsen or if the condition fails to improve as anticipated.  Pt was provided 180 minutes of non-face-to-face time during this encounter.   [name], LCSW     Daily Group Progress Note  Program: IOP  Group Time: 9:00 - 11:00  Participation Level: Active  Behavioral Response: Sharing  Type of Therapy:  Group Therapy  Summary of Progress: Clinician led check-in regarding current stressors and situation. Clinician utilized active listening and empathetic response and validated patient emotions. Clinician facilitated processing group on pertinent issues.  Patient rates hermood at a2.5on a scale of 1-10 with 10 being great. Pt states she cannot stop crying and is feeling "lost" over her mother's death. Pt states mom was also her best friend and doesn't know what to do without her. Pt states she is sleeping only 1-2 hours a night. Pt able to process and receive support.       Group Time: 11:00 - 12:00  Participation Level:  Active  Behavioral Response: Appropriate  Type of Therapy: Group Therapy  Summary of Progress: Cln led discussion on negative thought processes and how they can distort  the way in which we view reality. Group members shared struggles they have experienced in relationships and the negative self-talk that followed. Cln introduced CBT thought challenging principles and group worked to challenge negative thoughts shared. Pt engaged in discussion and is able to make connections.  At checkout, pt denies SI/HI.     [name], LCSW

## 2020-06-30 NOTE — Progress Notes (Signed)
Virtual Visit via Video Note  I connected with Linda Becker on 06/14/20 at  9:00 AM EST by a video enabled telemedicine application and verified that I am speaking with the correct person using two identifiers.  Location: Patient: patient home Provider: clinical home office   I discussed the limitations of evaluation and management by telemedicine and the availability of in person appointments. The patient expressed understanding and agreed to proceed.  I discussed the assessment and treatment plan with the patient. The patient was provided an opportunity to ask questions and all were answered. The patient agreed with the plan and demonstrated an understanding of the instructions.   The patient was advised to call back or seek an in-person evaluation if the symptoms worsen or if the condition fails to improve as anticipated.  Pt was provided 180 minutes of non-face-to-face time during this encounter.   Linda Glass, LCSW     Daily Group Progress Note  Program: IOP  Group Time: 9:00 - 10:00  Participation Level: Active  Behavioral Response: Sharing  Type of Therapy:  Group Therapy  Summary of Progress: Chaplain, C. Hardin Negus, led grief and loss group. Pt's were given space to process loss through a broad lens and provide support and connection with one another.  Pt reports this topic is sensitive for her as she buried her mother this week. Pt is able to process and provided support to other group members.     Group Time: 10:00 - 11:00  Participation Level:  Active  Behavioral Response: Appropriate and Sharing  Type of Therapy: Group Therapy  Summary of Progress: Clinician led check-in regarding current stressors and situation. Clinician utilized active listening and empathetic response and validated patient emotions. Clinician facilitated processing group on pertinent issues.  Patient rates hermood at a3.5on a scale of 1-10 with 10 being great. Pt  states she is feeling "numb" and trying to navigate through the loss of her mom.      Group Time: 11:00 - 12:00  Participation Level:  Active  Behavioral Response: Appropriate  Type of Therapy: Group Therapy  Summary of Progress: Cln discussed topic of boundaries and introduced how to set and maintain healthy boundaries. Cln utilized handout "how to set boundaries" and group members worked through examples to practice setting appropriate boundaries.  Pt engaged in discussion and reports struggle with maintaining boundaries.  At checkout, pt denies SI/HI and states understanding of crisis services should she need them over the long weekend.      Linda Glass, LCSW

## 2020-07-02 ENCOUNTER — Other Ambulatory Visit: Payer: Self-pay | Admitting: Internal Medicine

## 2020-07-02 MED FILL — PROMETHAZINE 25 MG TABLET: 25 | 20 days supply | Qty: 120 | Fill #2

## 2020-07-03 ENCOUNTER — Other Ambulatory Visit: Payer: Self-pay | Admitting: Internal Medicine

## 2020-07-03 MED FILL — ONDANSETRON ODT 8 MG TABLET: 8 | 10 days supply | Qty: 30 | Fill #0

## 2020-07-06 ENCOUNTER — Ambulatory Visit (INDEPENDENT_AMBULATORY_CARE_PROVIDER_SITE_OTHER): Payer: Self-pay | Admitting: Professional

## 2020-07-06 DIAGNOSIS — F411 Generalized anxiety disorder: Secondary | ICD-10-CM

## 2020-07-07 ENCOUNTER — Telehealth: Payer: Medicaid Other | Admitting: Orthopedic Surgery

## 2020-07-07 DIAGNOSIS — R059 Cough, unspecified: Secondary | ICD-10-CM

## 2020-07-07 MED ORDER — PREDNISONE 20 MG PO TABS: 40.0000 mg | ORAL_TABLET | Freq: Every day | ORAL | 0 refills | Status: AC

## 2020-07-07 MED ORDER — BENZONATATE 100 MG PO CAPS: 100.0000 mg | ORAL_CAPSULE | Freq: Three times a day (TID) | ORAL | 0 refills | Status: DC | PRN

## 2020-07-07 NOTE — Progress Notes (Signed)
We are sorry that you are not feeling well.  Here is how we plan to help!  Based on your presentation I believe you most likely have A cough due to a virus.  This is called viral bronchitis and is best treated by rest, plenty of fluids and control of the cough.  You may use Ibuprofen or Tylenol as directed to help your symptoms.     In addition you may use A prescription cough medication called Tessalon Perles 178m. You may take 1-2 capsules every 8 hours as needed for your cough.  I have also prescribed a short prednisone burst, 445mdaily for 5 days.  It's unclear if you have the results of your Covid test; please let us[place]oreanow what it is!   From your responses in the eVisit questionnaire you describe inflammation in the upper respiratory tract which is causing a significant cough.  This is commonly called Bronchitis and has four common causes:    Allergies  Viral Infections  Acid Reflux  Bacterial Infection Allergies, viruses and acid reflux are treated by controlling symptoms or eliminating the cause. An example might be a cough caused by taking certain blood pressure medications. You stop the cough by changing the medication. Another example might be a cough caused by acid reflux. Controlling the reflux helps control the cough.  USE OF BRONCHODILATOR ("RESCUE") INHALERS: There is a risk from using your bronchodilator too frequently.  The risk is that over-reliance on a medication which only relaxes the muscles surrounding the breathing tubes can reduce the effectiveness of medications prescribed to reduce swelling and congestion of the tubes themselves.  Although you feel brief relief from the bronchodilator inhaler, your asthma may actually be worsening with the tubes becoming more swollen and filled with mucus.  This can delay other crucial treatments, such as oral steroid medications. If you need to use a bronchodilator inhaler daily, several times per day, you should discuss this with  your provider.  There are probably better treatments that could be used to keep your asthma under control.     HOME CARE . Only take medications as instructed by your medical team. . Complete the entire course of an antibiotic. . Drink plenty of fluids and get plenty of rest. . Avoid close contacts especially the very young and the elderly . Cover your mouth if you cough or cough into your sleeve. . Always remember to wash your hands . A steam or ultrasonic humidifier can help congestion.   GET HELP RIGHT AWAY IF: . You develop worsening fever. . You become short of breath . You cough up blood. . Your symptoms persist after you have completed your treatment plan MAKE SURE YOU   Understand these instructions.  Will watch your condition.  Will get help right away if you are not doing well or get worse.  Your e-visit answers were reviewed by a board certified advanced clinical practitioner to complete your personal care plan.  Depending on the condition, your plan could have included both over the counter or prescription medications. If there is a problem please reply  once you have received a response from your provider. Your safety is important to us[place] If you have drug allergies check your prescription carefully.    You can use MyChart to ask questions about today's visit, request a non-urgent call back, or ask for a work or school excuse for 24 hours related to this e-Visit. If it has been greater than 24 hours you  will need to follow up with your provider, or enter a new e-Visit to address those concerns. You will get an e-mail in the next two days asking about your experience.  I hope that your e-visit has been valuable and will speed your recovery. Thank you for using e-visits.   Greater than 5 minutes, yet less than 10 minutes of time have been spent researching, coordinating and implementing care for this patient today.

## 2020-07-10 ENCOUNTER — Ambulatory Visit: Payer: 59 | Admitting: Internal Medicine

## 2020-07-13 ENCOUNTER — Ambulatory Visit: Payer: Self-pay | Admitting: Professional

## 2020-07-17 ENCOUNTER — Telehealth: Payer: Self-pay

## 2020-07-17 NOTE — Telephone Encounter (Signed)
Dr. Neldon Mc any recommendation. Patient was last seen by you 09/29/2019 and was advise to  Symbicort 160 two inhalation two times per day with spacer,  montelukast 68m one tablet one time per day, ProAir HFA 2 inhalations or albuterol neb every 4-6 hours

## 2020-07-17 NOTE — Telephone Encounter (Signed)
Patient tested positive for COVID on 07/12/2020 and is still positive since 07/17/2020 and is scheduled to see Dr. Nelva Bush on Thursday 07/19/2020 at 1020. Patient is currently just finished her prednisone medication and benzoate medications for coughing, having body aches, and fever between 100.5 to 101.2

## 2020-07-17 NOTE — Telephone Encounter (Signed)
She needs to come into the clinic this week for evaluation.

## 2020-07-17 NOTE — Telephone Encounter (Signed)
Patient tested positive for COVID about 2 weeks ago. Patient states she continues to be SOB & having a deep cough. Patient tried her Symbicort & Ventolin inhaler it helps but doesn't take the SOB and deep cough away.  Nebulizer helps some, but doesn't clear up the cough. She states she is getting heart palpitations with the nebulizer treatments. She has tried tessalon pearls & multiple over the counter medications. She also has taken prednisone.  Patient is wondering what else can she take?   [place].

## 2020-07-18 ENCOUNTER — Other Ambulatory Visit: Payer: Self-pay | Admitting: Internal Medicine

## 2020-07-18 MED FILL — traMADol HCL 50 MG TABS: 50 | 7 days supply | Qty: 30 | Fill #0

## 2020-07-18 MED FILL — LORAZEPAM 1 MG TABS: 1 | 30 days supply | Qty: 30 | Fill #0

## 2020-07-18 NOTE — Telephone Encounter (Signed)
She appears to be very symptomatic still with fevers and should continue to isolate.   Please change to a televisit.

## 2020-07-18 NOTE — Telephone Encounter (Signed)
Left a detail message letting patient know that this will be a televisit versus an in-person visit per Dr. Nelva Bush, to keep patient isolated due to [place].

## 2020-07-19 ENCOUNTER — Ambulatory Visit (INDEPENDENT_AMBULATORY_CARE_PROVIDER_SITE_OTHER): Payer: Self-pay | Admitting: Allergy

## 2020-07-19 ENCOUNTER — Encounter: Payer: Self-pay | Admitting: Allergy

## 2020-07-19 ENCOUNTER — Other Ambulatory Visit: Payer: Self-pay

## 2020-07-19 ENCOUNTER — Other Ambulatory Visit: Payer: Self-pay | Admitting: Allergy

## 2020-07-19 DIAGNOSIS — K219 Gastro-esophageal reflux disease without esophagitis: Secondary | ICD-10-CM

## 2020-07-19 DIAGNOSIS — H1010 Acute atopic conjunctivitis, unspecified eye: Secondary | ICD-10-CM

## 2020-07-19 DIAGNOSIS — J3089 Other allergic rhinitis: Secondary | ICD-10-CM

## 2020-07-19 DIAGNOSIS — T63441D Toxic effect of venom of bees, accidental (unintentional), subsequent encounter: Secondary | ICD-10-CM

## 2020-07-19 DIAGNOSIS — U071 COVID-19: Secondary | ICD-10-CM

## 2020-07-19 DIAGNOSIS — J4551 Severe persistent asthma with (acute) exacerbation: Secondary | ICD-10-CM

## 2020-07-19 DIAGNOSIS — H1013 Acute atopic conjunctivitis, bilateral: Secondary | ICD-10-CM

## 2020-07-19 MED ORDER — ALBUTEROL SULFATE HFA 108 (90 BASE) MCG/ACT IN AERS
INHALATION_SPRAY | RESPIRATORY_TRACT | 1 refills | Status: DC
Start: 2020-07-19 — End: 2020-07-19

## 2020-07-19 MED ORDER — PREDNISONE 10 MG PO TABS: ORAL_TABLET | ORAL | 0 refills | Status: DC

## 2020-07-19 MED ORDER — IPRATROPIUM-ALBUTEROL 0.5-2.5 (3) MG/3ML IN SOLN: 3.0000 mL | Freq: Four times a day (QID) | RESPIRATORY_TRACT | 2 refills | Status: DC | PRN

## 2020-07-19 MED FILL — predniSONE 10 MG TABS: 10 | 12 days supply | Qty: 39 | Fill #0

## 2020-07-19 MED FILL — IPRAT-ALBUT 0.5-3(2.5) MG/3: 0.5-2.5 (3) | 30 days supply | Qty: 360 | Fill #0

## 2020-07-19 MED FILL — ALBUTEROL SULFATE HFA 108 (: 108 (90 BAS | 16 days supply | Qty: 9 | Fill #0

## 2020-07-19 NOTE — Progress Notes (Signed)
RE: Linda Becker MRN: [id number] DOB: 10-14-1990 Date of Telemedicine Visit: 07/19/2020  Referring provider: Burnis Medin, MD Primary care provider: Burnis Medin, MD  Chief Complaint: Cough (CONGESTION GETTING WORSE) and Fever (BODY ACHES LAST TEMP. RECORDED 100.5)   Telemedicine Follow Up Visit via Telephone: I connected with Linda Becker for a follow up on 07/19/20 by telephone and verified that I am speaking with the correct person using two identifiers.   I discussed the limitations, risks, security and privacy concerns of performing an evaluation and management service by telephone and the availability of in person appointments. I also discussed with the patient that there may be a patient responsible charge related to this service. The patient expressed understanding and agreed to proceed.  Patient is at home.  Provider is at the office.  Visit start time: 1035  Visit end time: [place] consent/check in by: Kirke Corin Medical consent and medical assistant/nurse: Sheliah Plane  History of Present Illness: She is a 29 y.o. female, who is being followed for severe persistent asthma, allergic rhinitis with conjunctivitis, reflux. Her previous allergy office visit was on 09/29/19 with Dr. Neldon Mc.  Her visit today is a sick visit.  She was tested for covid the first day she started having symptoms and reports it was negative.   She states her husband had symptoms before she did and tested positive initially.  This was about 1.5-2 weeks ago.  She did a repeat test 3 days later that was again negative.  She retested at the end of week last week that was positive.  She tested again Monday or Tuesday of this week and was still positive.  She states her symptoms keep getting progressively worse and worse.  She states she has been having cough, difficulty breathing, fever, body ache, sinus headache, sore throat, nausea.  She states she has nausea at baseline and has had a very  decreased appetite. She states she was not getting any relief with her albuterol inhaler that she switched over to her nebulizer which may be providing a little bit of relief with her respiratory symptoms.  She has been using Tylenol maxed out per day.  She still reports body aches and fever.  She is not able to take NSAIDs due to liver cirrhosis.  She states she is in the process of getting a liver transplant. She had a televisit with PA and she was prescribed Medrol pack and tessalon perles.   She states she may have gotten a day or 2 of small symptom improvement with the steroids but not much.  Tessalon Perles have not helped at all.  She has been doing all the over-the-counter cough medications including Mucinex DM that she can take.  She has increased use of vitamins including B, C, D, zinc.  She continues to do her routine medications including her Symbicort twice a day, montelukast as well as her allergy medications including antihistamine and Qnasl. She is vaccinated.  Assessment and Plan: Linda Becker is a 30 y.o. female with: COVID-19 illness Severe persistent asthma with exacerbation due to viral illness Perennial allergic rhinitis with conjunctivitis Gastroesophageal reflux disease Hymenoptera sting      1. Every day use the following:   A. Symbicort 160 two inhalation two times per day with spacer  B. montelukast 83m one tablet one time per day  C. Protonix 40 mg one tablet twice a day  D. Cetirizine 1109mone tablet two times per day  E. Qnasal - one  puff each nostril one time per day  F. Dupilumab every 2 weeks    2. If needed:   A. ProAir HFA 2 inhalations or albuterol neb every 4-6 hours  B. OTC Systane multiple times per day  C. Pataday one drop each eye one time per day  D. Epi-Pen  3.  For this current Covid illness:  A.  Advised to peripherally that she step up her asthma management with respiratory 2 puffs twice a day.  She will hold her Symbicort for the time being  until she has symptom resolution and then he can go back to her regular Symbicort use.  Provided with samples.  B.  We will provide with a longer prednisone course as follows-30 mg twice a day x3 days, 20 mg twice a day x3 days, 10 mg twice a day x3 days, 10 mg daily x3 days and stop.  Am hopeful with a higher dose prednisone course as well as a longer taper that she will have better symptom improvement.  C.  Will prescribe duo nebs to use and replacement of her albuterol neb to see if this also helps with better relief of respiratory symptoms  D.  Request referral for possible monoclonal antibodies to see if she qualifies or not  E.  If she continues to have lingering symptoms that she may benefit from an evaluation at the post Covid clinic.  F.  Continue vitamin supplementation  G.  Continue Tylenol as needed for fever and pain relief  4. Return in 6-8 weeks or earlier if problem with Dr. Neldon Mc   Diagnostics: None.  Medication List:  Current Outpatient Medications  Medication Sig Dispense Refill  . albuterol (PROVENTIL) (2.5 MG/3ML) 0.083% nebulizer solution Take 3 mLs (2.5 mg total) by nebulization every 4 (four) hours as needed for wheezing or shortness of breath.    . ARIPiprazole (ABILIFY) 10 MG tablet Take 10 mg by mouth every morning.    . Ascorbic Acid (VITAMIN C WITH ROSE HIPS) 500 MG tablet Take 500 mg by mouth 2 (two) times daily.    . Beclomethasone Dipropionate (QNASL) 80 MCG/ACT AERS INSTILL 1 SPRAY IN EACH NOSTRIL ONCE DAILY AS DIRECTED (Patient taking differently: Place 2 sprays into the nose daily.) 31.8 g 1  . benzonatate (TESSALON) 100 MG capsule Take 1 capsule (100 mg total) by mouth 3 (three) times daily as needed for cough. 20 capsule 0  . calcium carbonate (OSCAL) 1500 (600 Ca) MG TABS tablet Take 600 mg of elemental calcium by mouth 2 (two) times daily with a meal.    . Cetirizine HCl 10 MG CAPS Take 10 mg by mouth 2 (two) times daily.     . dupilumab (DUPIXENT) 300  MG/2ML prefilled syringe Inject 300 mg into the skin every 14 (fourteen) days. 4 mL 11  . ferrous sulfate 325 (65 FE) MG tablet Take 325 mg by mouth daily with breakfast.    . fluconazole (DIFLUCAN) 100 MG tablet Take 2 tablets (200 mg) by mouth on day 1, then take 1 tablet by mouth x 13 days. 15 tablet 0  . FLUoxetine (PROZAC) 40 MG capsule Take 40 mg by mouth daily.     Marland Kitchen gabapentin (NEURONTIN) 300 MG capsule Take 300 mg by mouth See admin instructions. Take 300 mg in the morning and afternoon, and 600 mg at bedtime    . ipratropium-albuterol (DUONEB) 0.5-2.5 (3) MG/3ML SOLN Take 3 mLs by nebulization every 6 (six) hours as needed. 360 mL 2  .  lamoTRIgine (LAMICTAL) 150 MG tablet Take 300 mg by mouth daily.     Marland Kitchen levothyroxine (SYNTHROID) 88 MCG tablet Take 1 tablet (88 mcg total) by mouth daily before breakfast.    . LORazepam (ATIVAN) 0.5 MG tablet Take 0.5 mg by mouth at bedtime.    . montelukast (SINGULAIR) 10 MG tablet Take 1 tablet (10 mg total) by mouth daily. 90 tablet 1  . nadolol (CORGARD) 20 MG tablet Take 1 tablet (20 mg total) by mouth daily. 30 tablet 3  . Olopatadine HCl 0.2 % SOLN Can use one drop in each eye once daily if needed. (Patient taking differently: Place 1 drop into both eyes daily as needed (allergies).) 7.5 mL 1  . ondansetron (ZOFRAN) 8 MG tablet Take 8 mg by mouth daily as needed for nausea/vomiting.    . ondansetron (ZOFRAN-ODT) 8 MG disintegrating tablet DISSOLVE 1 TABLET BY MOUTH EVERY 8 HOURS AS NEEDED FOR NAUSEA AND/OR VOMITING 30 tablet 1  . predniSONE (DELTASONE) 10 MG tablet Take 3 tablets (30 mg total) by mouth 2 (two) times daily with a meal for 3 days, THEN 2 tablets (20 mg total) 2 (two) times daily with a meal for 3 days, THEN 1 tablet (10 mg total) 2 (two) times daily with a meal for 3 days, THEN 1 tablet (10 mg total) daily with breakfast for 3 days. 39 tablet 0  . Prenatal Vit-Fe Fumarate-FA (MULTIVITAMIN-PRENATAL) 27-0.8 MG TABS tablet Take 1 tablet by  mouth daily.     . promethazine (PHENERGAN) 25 MG tablet Take 1 tablet (25 mg total) by mouth every 4 (four) hours as needed for nausea. 120 tablet 2  . rifaximin (XIFAXAN) 550 MG TABS tablet Take 1 tablet (550 mg total) by mouth 2 (two) times daily. 60 tablet 3  . SYMBICORT 160-4.5 MCG/ACT inhaler INHALE 2 PUFFS BY MOUTH TWICE A DAY TO PREVENT COUGH OR WHEEZE. RINSE, GARGLE, AND SPIT AFTER USE. (Patient taking differently: Inhale 2 puffs into the lungs See admin instructions. INHALE 2 PUFFS BY MOUTH TWICE A DAY TO PREVENT COUGH OR WHEEZE. RINSE, GARGLE, AND SPIT AFTER USE.) 30.6 g 1  . topiramate (TOPAMAX) 50 MG tablet Take 50 mg by mouth 2 (two) times daily.    . traMADol (ULTRAM) 50 MG tablet TAKE 1 TABLET BY MOUTH EVERY 6 HOURS AS NEEDED FOR PAIN 30 tablet 0  . Vitamin D3 (VITAMIN D) 25 MCG tablet Take 1,000 Units by mouth daily.    . vitamin E 400 UNIT capsule Take 400 Units by mouth 2 (two) times daily.    Marland Kitchen albuterol (VENTOLIN HFA) 108 (90 Base) MCG/ACT inhaler INHALE 2 PUFFS BY MOUTH EVERY 4 TO 6 HOURS AS NEEDED FOR COUGH OR WHEEZE 18 g 1  . pantoprazole (PROTONIX) 40 MG tablet Take 1 tablet (40 mg total) by mouth 2 (two) times daily before a meal. 60 tablet 3   No current facility-administered medications for this visit.   Facility-Administered Medications Ordered in Other Visits  Medication Dose Route Frequency Provider Last Rate Last Admin  . promethazine (PHENERGAN) tablet 12.5-25 mg  12.5-25 mg Oral Q4H PRN Clovis Riley, MD       Or  . promethazine (PHENERGAN) suppository 12.5-25 mg  12.5-25 mg Rectal Q4H PRN Romana Juniper A, MD      . sodium chloride 0.9 % 1,000 mL with thiamine 793 mg, folic acid 1 mg, multivitamins adult 10 mL infusion   Intravenous Continuous Clovis Riley, MD  Allergies: Allergies  Allergen Reactions  . Progesterone Rash    Was in a form of birth control.  . Penicillins Rash    Did it involve swelling of the face/tongue/throat, SOB, or low  BP? No Did it involve sudden or severe rash/hives, skin peeling, or any reaction on the inside of your mouth or nose? Yes Did you need to seek medical attention at a hospital or doctor's office? No When did it last happen?9 + months If all above answers are "NO", may proceed with cephalosporin use.     I reviewed her past medical history, social history, family history, and environmental history and no significant changes have been reported from previous visit on 09/29/19.  Review of Systems  Constitutional: Positive for appetite change, fatigue and fever.  HENT: Positive for congestion, sinus pressure, sinus pain, sore throat and voice change.   Eyes: Negative.   Respiratory: Positive for cough and shortness of breath.   Cardiovascular: Negative.   Gastrointestinal: Positive for nausea.  Musculoskeletal: Positive for arthralgias and myalgias.  Skin: Negative.    Objective: Physical Exam Not obtained as encounter was done via telephone.   Previous notes and tests were reviewed.  I discussed the assessment and treatment plan with the patient. The patient was provided an opportunity to ask questions and all were answered. The patient agreed with the plan and demonstrated an understanding of the instructions.   The patient was advised to call back or seek an in-person evaluation if the symptoms worsen or if the condition fails to improve as anticipated.  I provided 20 minutes of non-face-to-face time during this encounter.  It was my pleasure to participate in [name] Mancinas's care today. Please feel free to contact me with any questions or concerns.   Sincerely,  Shaylar Charmian Muff, MD

## 2020-07-19 NOTE — Patient Instructions (Addendum)
    1. Every day use the following:   A. Symbicort 160 two inhalation two times per day with spacer  B. montelukast 25m one tablet one time per day  C. Protonix 40 mg one tablet twice a day  D. Cetirizine 174mone tablet two times per day  E. Qnasal - one puff each nostril one time per day  F. Dupilumab every 2 weeks    2. If needed:   A. ProAir HFA 2 inhalations or albuterol neb every 4-6 hours  B. OTC Systane multiple times per day  C. Pataday one drop each eye one time per day  D. Epi-Pen  3.  For this current Covid illness:  A.  Advised to peripherally that she step up her asthma management with respiratory 2 puffs twice a day.  She will hold her Symbicort for the time being until she has symptom resolution and then he can go back to her regular Symbicort use.  Provided with samples.  B.  We will provide with a longer prednisone course as follows-30 mg twice a day x3 days, 20 mg twice a day x3 days, 10 mg twice a day x3 days, 10 mg daily x3 days and stop.  Am hopeful with a higher dose prednisone course as well as a longer taper that she will have better symptom improvement.  C.  Will prescribe duo nebs to use and replacement of her albuterol neb to see if this also helps with better relief of respiratory symptoms  D.  Request referral for possible monoclonal antibodies to see if she qualifies or not  E.  If she continues to have lingering symptoms that she may benefit from an evaluation at the post Covid clinic.  F.  Continue vitamin supplementation  G.  Continue Tylenol as needed for fever and pain relief  4. Return in 6-8 weeks or earlier if problem with Dr. KoNeldon Mc

## 2020-07-19 NOTE — Addendum Note (Signed)
Addended by: Valere Dross on: 07/19/2020 01:44 PM   Modules accepted: Orders

## 2020-07-20 ENCOUNTER — Ambulatory Visit (INDEPENDENT_AMBULATORY_CARE_PROVIDER_SITE_OTHER): Payer: Self-pay | Admitting: Professional

## 2020-07-20 ENCOUNTER — Encounter: Payer: Self-pay | Admitting: Infectious Diseases

## 2020-07-20 ENCOUNTER — Telehealth: Payer: Self-pay | Admitting: Unknown Physician Specialty

## 2020-07-20 ENCOUNTER — Telehealth: Payer: Self-pay | Admitting: Nurse Practitioner

## 2020-07-20 DIAGNOSIS — F411 Generalized anxiety disorder: Secondary | ICD-10-CM

## 2020-07-20 DIAGNOSIS — F331 Major depressive disorder, recurrent, moderate: Secondary | ICD-10-CM

## 2020-07-20 NOTE — Progress Notes (Signed)
Let's see if we can set her up with the [facility] if she continues to have symptoms Post covid car clinic [phone].

## 2020-07-20 NOTE — Progress Notes (Signed)
Spoke with [facility] and they will call patient to schedule an appointment for patient.

## 2020-07-20 NOTE — Progress Notes (Signed)
Reviewed referral - patient has been sick with COVID symptoms now for 1.5-2 weeks. This is beyond what we can offer for treatment with monoclonal antibodies (10 days max) based off the EUA.   For persistent symptoms could consider arranging her to be seen at the [facility] clinic [phone].   Will communicate with referring provider.   Janene Madeira, MSN, NP-C [facility] [email or web address] Pager: [phone] Office: [phone] [place]: [phone]

## 2020-07-20 NOTE — Telephone Encounter (Signed)
[place] [facility] called pt to schedule appointment at request of provider.  LVM for return call.

## 2020-07-20 NOTE — Telephone Encounter (Signed)
Called to discuss with patient about COVID-19 symptoms and the use of one of the available treatments for those with mild to moderate Covid symptoms and at a high risk of hospitalization.  Pt appears to qualify for outpatient treatment due to co-morbid conditions and/or a member of an at-risk group in accordance with the FDA Emergency Use Authorization.     Unable to reach pt - [place]

## 2020-07-24 ENCOUNTER — Ambulatory Visit: Payer: Self-pay | Admitting: Professional

## 2020-07-25 NOTE — Telephone Encounter (Signed)
So I am in a disadvantage of giving any advice since I have not had a visit with you.  Regarding this infection.  Sorry you are feeling so badly . I see that you have had an E visit and a visit with your allergist in regard to this Covid situation.  Consider asking the allergist office again about any advice of symptom treatment  Other alternative is   the Covid clinic.It appears that they have reached out to you for a visit   Phone to you on feb 4   Please check on  Post covid care clinic appt

## 2020-08-03 ENCOUNTER — Ambulatory Visit (INDEPENDENT_AMBULATORY_CARE_PROVIDER_SITE_OTHER): Payer: Self-pay | Admitting: Professional

## 2020-08-03 DIAGNOSIS — F331 Major depressive disorder, recurrent, moderate: Secondary | ICD-10-CM

## 2020-08-03 DIAGNOSIS — F411 Generalized anxiety disorder: Secondary | ICD-10-CM

## 2020-08-07 ENCOUNTER — Other Ambulatory Visit: Payer: Self-pay | Admitting: Allergy and Immunology

## 2020-08-07 ENCOUNTER — Other Ambulatory Visit: Payer: Self-pay | Admitting: Internal Medicine

## 2020-08-07 MED FILL — GABAPENTIN 300 MG CAPSULE: 300 | 30 days supply | Qty: 180 | Fill #1

## 2020-08-07 MED FILL — MONTELUKAST SOD 10 MG TAB: 10 | 90 days supply | Qty: 90 | Fill #0

## 2020-08-08 ENCOUNTER — Other Ambulatory Visit: Payer: Self-pay | Admitting: Internal Medicine

## 2020-08-08 ENCOUNTER — Telehealth: Payer: Self-pay | Admitting: Gastroenterology

## 2020-08-08 MED ORDER — ONDANSETRON 8 MG PO TBDP: 8.0000 mg | ORAL_TABLET | Freq: Three times a day (TID) | ORAL | 0 refills | Status: DC | PRN

## 2020-08-08 MED ORDER — PROMETHAZINE HCL 25 MG PO TABS: 25.0000 mg | ORAL_TABLET | Freq: Four times a day (QID) | ORAL | 0 refills | Status: DC | PRN

## 2020-08-08 MED ORDER — NADOLOL 20 MG PO TABS: 20.0000 mg | ORAL_TABLET | Freq: Every day | ORAL | 0 refills | Status: DC

## 2020-08-08 MED ORDER — PANTOPRAZOLE SODIUM 40 MG PO TBEC: 40.0000 mg | DELAYED_RELEASE_TABLET | Freq: Two times a day (BID) | ORAL | 0 refills | Status: DC

## 2020-08-08 MED FILL — PROMETHAZINE 25 MG TABLET: 25 | 15 days supply | Qty: 60 | Fill #0

## 2020-08-08 MED FILL — traMADol HCL 50 MG TABS: 50 | 7 days supply | Qty: 30 | Fill #0

## 2020-08-08 MED FILL — PANTOPRAZOLE SOD DR 40 MG T: 40 | 30 days supply | Qty: 60 | Fill #0

## 2020-08-08 NOTE — Telephone Encounter (Signed)
Patient's hospital endoscopy was rescheduled from 09/18/20 to 09/26/20 at 8:30 am due to scheduling conflict for Dr [name]. Patient was previously scheduled to have a visit with Dr [name] and receive her hospital prep instructions on 07/10/20 but had to cancel her office visit due to feeling COVID-like symptoms (she actually tested positive on 07/12/20). Due to this, patient will no longer need pre-procedure COVID screen for the hospital since she is within a 76 day period of having COVID (hospital policy says no antigen testing is required if COVID positive within 90 days of procedure). Patient has rescheduled an office follow up for 08/23/20 and will receive procedure instructions at that time.

## 2020-08-08 NOTE — Progress Notes (Incomplete)
Patient Care Team: Panosh, Standley Brooking, MD as PCP - General (Internal Medicine) Neldon Mc Donnamarie Poag, MD as Consulting Physician (Allergy and Immunology) Adrian Prows, MD as Consulting Physician (Cardiology) Elayne Snare, MD as Consulting Physician (Endocrinology) Pyrtle, [name], MD as Consulting Physician (Gastroenterology) Neldon Mc Donnamarie Poag, MD as Consulting Physician (Allergy and Immunology)  DIAGNOSIS: No diagnosis found.  CHIEF COMPLIANT: Follow-up of thrombocytopenia and splenomegaly  INTERVAL HISTORY: Linda Becker is a 30 y.o. with above-mentioned history of thrombocytopenia and splenomegaly. Abdominal US on 06/22/20 showed cirrhotic liver morphology and splenomegaly. She presents to the clinic today for follow-up.   ALLERGIES:  is allergic to progesterone and penicillins.  MEDICATIONS:  Current Outpatient Medications  Medication Sig Dispense Refill  . albuterol (PROVENTIL) (2.5 MG/3ML) 0.083% nebulizer solution Take 3 mLs (2.5 mg total) by nebulization every 4 (four) hours as needed for wheezing or shortness of breath.    Marland Kitchen albuterol (VENTOLIN HFA) 108 (90 Base) MCG/ACT inhaler INHALE 2 PUFFS BY MOUTH EVERY 4 TO 6 HOURS AS NEEDED FOR COUGH OR WHEEZE 18 g 1  . ARIPiprazole (ABILIFY) 10 MG tablet Take 10 mg by mouth every morning.    . Ascorbic Acid (VITAMIN C WITH ROSE HIPS) 500 MG tablet Take 500 mg by mouth 2 (two) times daily.    . Beclomethasone Dipropionate (QNASL) 80 MCG/ACT AERS INSTILL 1 SPRAY IN EACH NOSTRIL ONCE DAILY AS DIRECTED (Patient taking differently: Place 2 sprays into the nose daily.) 31.8 g 1  . benzonatate (TESSALON) 100 MG capsule Take 1 capsule (100 mg total) by mouth 3 (three) times daily as needed for cough. 20 capsule 0  . calcium carbonate (OSCAL) 1500 (600 Ca) MG TABS tablet Take 600 mg of elemental calcium by mouth 2 (two) times daily with a meal.    . Cetirizine HCl 10 MG CAPS Take 10 mg by mouth 2 (two) times daily.     . dupilumab (DUPIXENT) 300 MG/2ML  prefilled syringe Inject 300 mg into the skin every 14 (fourteen) days. 4 mL 11  . ferrous sulfate 325 (65 FE) MG tablet Take 325 mg by mouth daily with breakfast.    . fluconazole (DIFLUCAN) 100 MG tablet Take 2 tablets (200 mg) by mouth on day 1, then take 1 tablet by mouth x 13 days. 15 tablet 0  . FLUoxetine (PROZAC) 40 MG capsule Take 40 mg by mouth daily.     Marland Kitchen gabapentin (NEURONTIN) 300 MG capsule Take 300 mg by mouth See admin instructions. Take 300 mg in the morning and afternoon, and 600 mg at bedtime    . ipratropium-albuterol (DUONEB) 0.5-2.5 (3) MG/3ML SOLN Take 3 mLs by nebulization every 6 (six) hours as needed. 360 mL 2  . lamoTRIgine (LAMICTAL) 150 MG tablet Take 300 mg by mouth daily.     Marland Kitchen levothyroxine (SYNTHROID) 88 MCG tablet Take 1 tablet (88 mcg total) by mouth daily before breakfast.    . LORazepam (ATIVAN) 0.5 MG tablet Take 0.5 mg by mouth at bedtime.    . montelukast (SINGULAIR) 10 MG tablet TAKE 1 TABLET BY MOUTH ONCE DAILY 90 tablet 1  . nadolol (CORGARD) 20 MG tablet Take 1 tablet (20 mg total) by mouth daily. 30 tablet 0  . Olopatadine HCl 0.2 % SOLN Can use one drop in each eye once daily if needed. (Patient taking differently: Place 1 drop into both eyes daily as needed (allergies).) 7.5 mL 1  . ondansetron (ZOFRAN-ODT) 8 MG disintegrating tablet Take 1 tablet (8  mg total) by mouth every 8 (eight) hours as needed for nausea or vomiting. 30 tablet 0  . pantoprazole (PROTONIX) 40 MG tablet Take 1 tablet (40 mg total) by mouth 2 (two) times daily before a meal. 60 tablet 0  . Prenatal Vit-Fe Fumarate-FA (MULTIVITAMIN-PRENATAL) 27-0.8 MG TABS tablet Take 1 tablet by mouth daily.     . promethazine (PHENERGAN) 25 MG tablet Take 1 tablet (25 mg total) by mouth every 6 (six) hours as needed for nausea. 60 tablet 0  . rifaximin (XIFAXAN) 550 MG TABS tablet Take 1 tablet (550 mg total) by mouth 2 (two) times daily. 60 tablet 3  . SYMBICORT 160-4.5 MCG/ACT inhaler INHALE 2  PUFFS BY MOUTH TWICE A DAY TO PREVENT COUGH OR WHEEZE. RINSE, GARGLE, AND SPIT AFTER USE. (Patient taking differently: Inhale 2 puffs into the lungs See admin instructions. INHALE 2 PUFFS BY MOUTH TWICE A DAY TO PREVENT COUGH OR WHEEZE. RINSE, GARGLE, AND SPIT AFTER USE.) 30.6 g 1  . topiramate (TOPAMAX) 50 MG tablet Take 50 mg by mouth 2 (two) times daily.    . traMADol (ULTRAM) 50 MG tablet TAKE 1 TABLET BY MOUTH EVERY 6 HOURS AS NEEDED FOR PAIN 30 tablet 0  . Vitamin D3 (VITAMIN D) 25 MCG tablet Take 1,000 Units by mouth daily.    . vitamin E 400 UNIT capsule Take 400 Units by mouth 2 (two) times daily.     No current facility-administered medications for this visit.   Facility-Administered Medications Ordered in Other Visits  Medication Dose Route Frequency Provider Last Rate Last Admin  . promethazine (PHENERGAN) tablet 12.5-25 mg  12.5-25 mg Oral Q4H PRN Clovis Riley, MD       Or  . promethazine (PHENERGAN) suppository 12.5-25 mg  12.5-25 mg Rectal Q4H PRN Romana Juniper A, MD      . sodium chloride 0.9 % 1,000 mL with thiamine 408 mg, folic acid 1 mg, multivitamins adult 10 mL infusion   Intravenous Continuous Clovis Riley, MD        PHYSICAL EXAMINATION: ECOG PERFORMANCE STATUS: {CHL ONC ECOG XK:[id number]}  There were no vitals filed for this visit. There were no vitals filed for this visit.  LABORATORY DATA:  I have reviewed the data as listed CMP Latest Ref Rng & Units 06/22/2020 05/18/2020 04/03/2020  Glucose 70 - 99 mg/dL 90 91 141(H)  BUN 6 - 23 mg/dL 11 8 5(L)  Creatinine 0.40 - 1.20 mg/dL 0.80 0.82 0.70  Sodium 135 - 145 mEq/L 140 139 141  Potassium 3.5 - 5.1 mEq/L 4.5 4.1 3.6  Chloride 96 - 112 mEq/L 107 106 112  CO2 19 - 32 mEq/L 29 27 23   Calcium 8.4 - 10.5 mg/dL 9.0 9.0 8.7  Total Protein 6.0 - 8.3 g/dL 6.6 6.5 6.1  Total Bilirubin 0.2 - 1.2 mg/dL 0.6 0.5 0.5  Alkaline Phos 39 - 117 U/L 87 79 90  AST 0 - 37 U/L 18 25 19   ALT 0 - 35 U/L 13 20 26      Lab Results  Component Value Date   WBC 2.8 (L) 06/22/2020   HGB 13.5 06/22/2020   HCT 40.0 06/22/2020   MCV 91.1 06/22/2020   PLT 61.0 (L) 06/22/2020   NEUTROABS 1.5 06/22/2020    ASSESSMENT & PLAN:  No problem-specific Assessment & Plan notes found for this encounter.    No orders of the defined types were placed in this encounter.  The patient has a good understanding of  the overall plan. she agrees with it. she will call with any problems that may develop before the next visit here.  Total time spent: *** mins including face to face time and time spent for planning, charting and coordination of care  Rulon Eisenmenger, MD, MPH 08/08/2020  I, Molly Dorshimer, am acting as scribe for Dr. Nicholas Lose.  {insert scribe attestation}

## 2020-08-08 NOTE — Telephone Encounter (Addendum)
Refills for Promethazine 54m # 60 no refills, and Zofran 854mODT #30 [facility] JeAlonza BogusA. Left message on cell phone for pt letting her know that refills have been sent to pharmacy.

## 2020-08-09 ENCOUNTER — Inpatient Hospital Stay: Payer: Medicaid Other

## 2020-08-09 ENCOUNTER — Inpatient Hospital Stay: Payer: Medicaid Other | Attending: Hematology and Oncology | Admitting: Hematology and Oncology

## 2020-08-09 NOTE — Assessment & Plan Note (Deleted)
Lab review: 02/04/2018: Platelets 135 07/27/2018: Platelet count 102 02/07/2020: Platelet count 63 (hospitalization September 2021 for hematemesis due to variceal bleed) 06/22/2020: Platelets 61 Prior history of gastric bypass surgery January 2021 Etiology: Splenomegaly due to cirrhosis of the liver  Return to clinic in 1 year for follow-up

## 2020-08-10 ENCOUNTER — Ambulatory Visit: Payer: Self-pay | Admitting: Professional

## 2020-08-17 ENCOUNTER — Ambulatory Visit: Payer: Self-pay | Admitting: Professional

## 2020-08-23 ENCOUNTER — Other Ambulatory Visit: Payer: Self-pay | Admitting: Gastroenterology

## 2020-08-23 ENCOUNTER — Encounter: Payer: Self-pay | Admitting: Gastroenterology

## 2020-08-23 ENCOUNTER — Ambulatory Visit (INDEPENDENT_AMBULATORY_CARE_PROVIDER_SITE_OTHER): Payer: Self-pay | Admitting: Gastroenterology

## 2020-08-23 ENCOUNTER — Telehealth: Payer: Self-pay | Admitting: Gastroenterology

## 2020-08-23 VITALS — BP 120/84 | HR 112 | Ht 65.0 in | Wt 278.0 lb

## 2020-08-23 DIAGNOSIS — R1084 Generalized abdominal pain: Secondary | ICD-10-CM | POA: Insufficient documentation

## 2020-08-23 DIAGNOSIS — K7581 Nonalcoholic steatohepatitis (NASH): Secondary | ICD-10-CM

## 2020-08-23 DIAGNOSIS — I8500 Esophageal varices without bleeding: Secondary | ICD-10-CM

## 2020-08-23 DIAGNOSIS — R110 Nausea: Secondary | ICD-10-CM

## 2020-08-23 MED ORDER — ONDANSETRON 8 MG PO TBDP: 8.0000 mg | ORAL_TABLET | Freq: Three times a day (TID) | ORAL | 3 refills | Status: DC | PRN

## 2020-08-23 MED ORDER — RIFAXIMIN 550 MG PO TABS: 550.0000 mg | ORAL_TABLET | Freq: Two times a day (BID) | ORAL | 3 refills | Status: DC

## 2020-08-23 MED ORDER — NADOLOL 20 MG PO TABS: 20.0000 mg | ORAL_TABLET | Freq: Every day | ORAL | 3 refills | Status: DC

## 2020-08-23 MED ORDER — PANTOPRAZOLE SODIUM 40 MG PO TBEC: 40.0000 mg | DELAYED_RELEASE_TABLET | Freq: Two times a day (BID) | ORAL | 3 refills | Status: DC

## 2020-08-23 MED ORDER — PROMETHAZINE HCL 25 MG PO TABS: 25.0000 mg | ORAL_TABLET | Freq: Four times a day (QID) | ORAL | 3 refills | Status: DC | PRN

## 2020-08-23 MED ORDER — HYDROCODONE-ACETAMINOPHEN 5-325 MG PO TABS: 1.0000 | ORAL_TABLET | Freq: Two times a day (BID) | ORAL | 0 refills | Status: DC | PRN

## 2020-08-23 MED FILL — PROMETHAZINE 25 MG TABLET: 25 | 30 days supply | Qty: 120 | Fill #0

## 2020-08-23 MED FILL — NADOLOL 20 MG TAB: 20 | 30 days supply | Qty: 30 | Fill #0

## 2020-08-23 MED FILL — HYDROCODON-APAP 5-325: 5-325 | 8 days supply | Qty: 15 | Fill #0

## 2020-08-23 MED FILL — PANTOPRAZOLE SOD DR 40 MG T: 40 | 30 days supply | Qty: 60 | Fill #0

## 2020-08-23 NOTE — Progress Notes (Signed)
08/23/2020 Linda Becker [id number] [birth year]   HISTORY OF PRESENT ILLNESS: This is a 30 year old female with past medical history of biopsy-proven Karlene Lineman cirrhosis with portal hypertension complicated by esophageal varices with bleeding, thrombocytopenia, she also has obesity status post gastric sleeve in January 2021, prior sessile serrated colon polyps in 2020.  She is here for follow-up.  She is here alone today.  Her last visit here was in November 2021.  She says that she has not been seen at [facility] again yet at this point since she had some issues with changing insurance.  She went from short-term disability long-term disability and so lost her insurance through [place], etc.  She is planning to follow-up with them as well.  She is scheduled for EGD in April with Dr. [name].  She tells me that overall things are stable and unchanged really.  Her mother passed away in 07/08/2023 from her liver disease.  She had Covid in February.  She still complains of fogginess and forgetfulness.  The nausea and abdominal pain are still the same.  She continues to alternate promethazine and ondansetron.  She tells me that she is taking tramadol and Tylenol for her abdominal pain, but it does not seem to help.  She is asking about something different that potentially does not contain Tylenol.  She had an ultrasound in January that did not show any masses and appears stable.  Labs from January appear stable.  Past Medical History:  Diagnosis Date  . Allergic rhinitis    remote hx of allergy shots  . Asthma    since childhood  on controller meds extrinsic dr Carmelina Peal  . Bipolar II disorder ([place])   . Cirrhosis ([place])   . Esophageal varices ([place])   . Family history of adverse reaction to anesthesia   . Gallbladder sludge   . Gastropathy   . GERD (gastroesophageal reflux disease)    on nexium  for  long term sx since childhood  . H/O miscarriage, not currently pregnant    [redacted] weeks  march 2016  . Hepatic  steatosis   . Hypothyroidism   . Migraine   . Murmur    pt reports MVP  . Portal hypertension ([place])   . Sessile colonic polyp   . Splenomegaly   . Steatohepatitis   . Syncope    under eval ? cause  . Tachycardia    episodes with near syncope eval dr Nadyne Coombes  on no meds dced LABA  . Thrombocytopenia ([place])   . Upper GI bleed    Past Surgical History:  Procedure Laterality Date  . BIOPSY  09/03/2018   Procedure: BIOPSY;  Surgeon: Jerene Bears, MD;  Location: [facility];  Service: Gastroenterology;;  . broken right femur  2008   rod placed  . COLONOSCOPY WITH PROPOFOL N/A 09/03/2018   Procedure: COLONOSCOPY WITH PROPOFOL;  Surgeon: Jerene Bears, MD;  Location: [facility];  Service: Gastroenterology;  Laterality: N/A;  . DILATION AND CURETTAGE OF UTERUS    . ESOPHAGOGASTRODUODENOSCOPY (EGD) WITH PROPOFOL N/A 09/03/2018   Procedure: ESOPHAGOGASTRODUODENOSCOPY (EGD) WITH PROPOFOL;  Surgeon: Jerene Bears, MD;  Location: [facility];  Service: Gastroenterology;  Laterality: N/A;  . ESOPHAGOGASTRODUODENOSCOPY (EGD) WITH PROPOFOL N/A 01/27/2020   Procedure: ESOPHAGOGASTRODUODENOSCOPY (EGD) WITH PROPOFOL;  Surgeon: Rush Landmark Telford Nab., MD;  Location: [place];  Service: Gastroenterology;  Laterality: N/A;  . ESOPHAGOGASTRODUODENOSCOPY (EGD) WITH PROPOFOL N/A 03/12/2020   Procedure: ESOPHAGOGASTRODUODENOSCOPY (EGD) WITH PROPOFOL;  Surgeon: Jerene Bears,  MD;  Location: [facility];  Service: Gastroenterology;  Laterality: N/A;  . GASTRIC VARICES BANDING  01/27/2020   Procedure: GASTRIC VARICES BANDING;  Surgeon: Rush Landmark Telford Nab., MD;  Location: [place];  Service: Gastroenterology;;  . IR TRANSCATHETER BX  01/19/2019  . IR US GUIDE VASC ACCESS RIGHT  01/19/2019  . IR VENOGRAM HEPATIC W HEMODYNAMIC EVALUATION  01/19/2019  . LAPAROSCOPIC GASTRIC SLEEVE RESECTION N/A 06/28/2019   Procedure: LAPAROSCOPIC GASTRIC SLEEVE RESECTION, Upper Endo, ERAS Pathway;  Surgeon: Clovis Riley,  MD;  Location: [facility];  Service: General;  Laterality: N/A;  . OB ultrasound N/A 12/01/2017   see report  . POLYPECTOMY  09/03/2018   Procedure: POLYPECTOMY;  Surgeon: Jerene Bears, MD;  Location: [facility];  Service: Gastroenterology;;  . Jacques Earthly  . WISDOM TOOTH EXTRACTION      reports that she has never smoked. She has never used smokeless tobacco. She reports that she does not drink alcohol and does not use drugs. family history includes Arthritis in her father, maternal grandmother, and mother; Asthma in her father; Bleeding Disorder in her mother; Breast cancer in her maternal aunt, maternal grandmother, paternal aunt, and paternal grandmother; Colon cancer in her paternal grandfather; Colon polyps in her father and mother; Diabetes in her father, maternal grandfather, mother, paternal grandfather, and paternal grandmother; Esophageal cancer in her paternal aunt; Heart disease in her father and mother; Hyperlipidemia in her father and mother; Hypertension in her father and mother; Kidney disease in her mother; Liver cancer in her paternal grandfather; Liver disease in her mother; Pancreatic cancer in her maternal grandfather; Stomach cancer in her paternal grandfather; Thyroid disease in her maternal grandmother. Allergies  Allergen Reactions  . Progesterone Rash    Was in a form of birth control.  . Penicillins Rash          Outpatient Encounter Medications as of 08/23/2020  Medication Sig  . albuterol (PROVENTIL) (2.5 MG/3ML) 0.083% nebulizer solution Take 3 mLs (2.5 mg total) by nebulization every 4 (four) hours as needed for wheezing or shortness of breath.  Marland Kitchen albuterol (VENTOLIN HFA) 108 (90 Base) MCG/ACT inhaler INHALE 2 PUFFS BY MOUTH EVERY 4 TO 6 HOURS AS NEEDED FOR COUGH OR WHEEZE  . ARIPiprazole (ABILIFY) 10 MG tablet Take 10 mg by mouth every morning.  . Ascorbic Acid (VITAMIN C WITH ROSE HIPS) 500 MG tablet Take 500 mg by mouth 2 (two) times daily.  .  Beclomethasone Dipropionate (QNASL) 80 MCG/ACT AERS INSTILL 1 SPRAY IN EACH NOSTRIL ONCE DAILY AS DIRECTED (Patient taking differently: Place 2 sprays into the nose daily.)  . calcium carbonate (OSCAL) 1500 (600 Ca) MG TABS tablet Take 600 mg of elemental calcium by mouth 2 (two) times daily with a meal.  . Cetirizine HCl 10 MG CAPS Take 10 mg by mouth 2 (two) times daily.   . ferrous sulfate 325 (65 FE) MG tablet Take 325 mg by mouth daily with breakfast.  . FLUoxetine (PROZAC) 40 MG capsule Take 40 mg by mouth daily.   Marland Kitchen gabapentin (NEURONTIN) 300 MG capsule Take 300 mg by mouth See admin instructions. Take 300 mg in the morning and afternoon, and 600 mg at bedtime  . ipratropium-albuterol (DUONEB) 0.5-2.5 (3) MG/3ML SOLN Take 3 mLs by nebulization every 6 (six) hours as needed.  . lamoTRIgine (LAMICTAL) 150 MG tablet Take 300 mg by mouth daily.   . montelukast (SINGULAIR) 10 MG tablet TAKE 1 TABLET BY MOUTH ONCE DAILY  . nadolol (  CORGARD) 20 MG tablet Take 1 tablet (20 mg total) by mouth daily.  . Olopatadine HCl 0.2 % SOLN Can use one drop in each eye once daily if needed. (Patient taking differently: Place 1 drop into both eyes daily as needed (allergies).)  . ondansetron (ZOFRAN-ODT) 8 MG disintegrating tablet Take 1 tablet (8 mg total) by mouth every 8 (eight) hours as needed for nausea or vomiting.  . pantoprazole (PROTONIX) 40 MG tablet Take 1 tablet (40 mg total) by mouth 2 (two) times daily before a meal.  . Prenatal Vit-Fe Fumarate-FA (MULTIVITAMIN-PRENATAL) 27-0.8 MG TABS tablet Take 1 tablet by mouth daily.   . promethazine (PHENERGAN) 25 MG tablet Take 1 tablet (25 mg total) by mouth every 6 (six) hours as needed for nausea.  . rifaximin (XIFAXAN) 550 MG TABS tablet Take 1 tablet (550 mg total) by mouth 2 (two) times daily.  . SYMBICORT 160-4.5 MCG/ACT inhaler INHALE 2 PUFFS BY MOUTH TWICE A DAY TO PREVENT COUGH OR WHEEZE. RINSE, GARGLE, AND SPIT AFTER USE. (Patient taking differently:  Inhale 2 puffs into the lungs See admin instructions. INHALE 2 PUFFS BY MOUTH TWICE A DAY TO PREVENT COUGH OR WHEEZE. RINSE, GARGLE, AND SPIT AFTER USE.)  . traMADol (ULTRAM) 50 MG tablet TAKE 1 TABLET BY MOUTH EVERY 6 HOURS AS NEEDED FOR PAIN  . Vitamin D3 (VITAMIN D) 25 MCG tablet Take 1,000 Units by mouth daily.  . vitamin E 400 UNIT capsule Take 400 Units by mouth 2 (two) times daily.  . [DISCONTINUED] benzonatate (TESSALON) 100 MG capsule Take 1 capsule (100 mg total) by mouth 3 (three) times daily as needed for cough.  . [DISCONTINUED] dupilumab (DUPIXENT) 300 MG/2ML prefilled syringe Inject 300 mg into the skin every 14 (fourteen) days.  . [DISCONTINUED] fluconazole (DIFLUCAN) 100 MG tablet Take 2 tablets (200 mg) by mouth on day 1, then take 1 tablet by mouth x 13 days.  . [DISCONTINUED] levothyroxine (SYNTHROID) 88 MCG tablet Take 1 tablet (88 mcg total) by mouth daily before breakfast.  . [DISCONTINUED] LORazepam (ATIVAN) 0.5 MG tablet Take 0.5 mg by mouth at bedtime.  . [DISCONTINUED] topiramate (TOPAMAX) 50 MG tablet Take 50 mg by mouth 2 (two) times daily.   Facility-Administered Encounter Medications as of 08/23/2020  Medication  . promethazine (PHENERGAN) tablet 12.5-25 mg   Or  . promethazine (PHENERGAN) suppository 12.5-25 mg  . sodium chloride 0.9 % 1,000 mL with thiamine 235 mg, folic acid 1 mg, multivitamins adult 10 mL infusion     REVIEW OF SYSTEMS  : All other systems reviewed and negative except where noted in the History of Present Illness.   PHYSICAL EXAM: BP 120/84   Pulse (!) 112   Ht 5' 5"  (1.651 m)   Wt 278 lb (126.1 kg)   BMI 46.26 kg/m  General: Well developed white female in no acute distress Head: Normocephalic and atraumatic Eyes:  Sclerae anicteric, conjunctiva pink. Ears: Normal auditory acuity Lungs: Clear throughout to auscultation; no W/R/R. Heart: Regular rate and rhythm; no M/R/G. Abdomen: Soft, non-distended.  BS present.  Mild diffuse  TTP. Musculoskeletal: Symmetrical with no gross deformities  Skin: No lesions on visible extremities Extremities: No edema  Neurological: Alert oriented x 4, grossly non-focal Psychological:  Alert and cooperative. Normal mood and affect  ASSESSMENT AND PLAN: 30 year old with a past medical history of biopsy-proven NASH cirrhosis with portal hypertension complicated by esophageal varices with bleeding, thrombocytopenia, obesity status post gastric sleeve in January 2021, prior sessile  serrated colon polyps in 2020 who is here for follow-up  1.  Decompensated NASH cirrhosis with portal hypertension/history of esophageal varices with hemorrhage, thrombocytopenia and splenomegaly --   Her labs from January are stable but her portal hypertension has been documented to be worse than her synthetic function.  She has also establish care with [facility] hepatology and she will follow with them as well.  At this point she is not in need of liver transplantation.  It is suspicious that she may have low-level hepatic encephalopathy which may explain some of her fatigue, tiredness/sleepiness and forgetfulness so she was started on Xifaxan 550 mg BID.  --HCC screening, up-to-date with MRI in September 2021 and then ultrasound 06/2020. --Varices: Small at endoscopy in September, I had recommended 1 year surveillance with Dr. Loletha Grayer recommend 3 to 6 months.  She is scheduled for EGD in April with Dr. [name]; continue nadolol 20 mg daily --Continue low-sodium diet  2.  Upper abdominal pain and bloating symptom --repeat imaging did not show an issue with gallbladder or pancreas.  She is taking tramadol for pain but says that this does not help.  She is asking for something else.  I agreed to prescribe a small amount of hydrocodone (without tylenol component at her request).  She is also on PPI. --Continue pantoprazole 40 mg daily  3.  Nausea --continues to alternate promethazine 25 mg every 6 hours with ondansetron  4 to 8 mg every 8 hours.  4.  History of sessile serrated colon polyp --follow-up colonoscopy recommended March 2025  --Will refill pantoprazole, Zofran, promethazine, nadolol, and Xifaxan.  Prescription sent to pharmacy.  All of her labs from January were stable so do not think she needs repeat labs at this time.  Other plans as above.   CC:  Panosh, Standley Brooking, MD

## 2020-08-23 NOTE — Telephone Encounter (Signed)
PA submitted for Xifaxan via [place] [employer]

## 2020-08-23 NOTE — Addendum Note (Signed)
Addended by: Alonza Bogus D on: 08/23/2020 05:22 PM   Modules accepted: Orders

## 2020-08-23 NOTE — Patient Instructions (Addendum)
If you are age 28 or older, your body mass index should be between 23-30. Your Body mass index is 46.26 kg/m. If this is out of the aforementioned range listed, please consider follow up with your Primary Care Provider.  If you are age 66 or younger, your body mass index should be between 19-25. Your Body mass index is 46.26 kg/m. If this is out of the aformentioned range listed, please consider follow up with your Primary Care Provider.   We have sent the following medications to your pharmacy for you to pick up at your convenience: Nadolol, Pantoprazole, Xifaxan, Zofran, Phenergan and Hydrocodone.  You have been scheduled for an endoscopy. Please follow written instructions given to you at your visit today. If you use inhalers (even only as needed), please bring them with you on the day of your procedure.  Due to recent changes in healthcare laws, you may see the results of your imaging and laboratory studies on MyChart before your provider has had a chance to review them.  We understand that in some cases there may be results that are confusing or concerning to you. Not all laboratory results come back in the same time frame and the provider may be waiting for multiple results in order to interpret others.  Please give [place] 48 hours in order for your provider to thoroughly review all the results before contacting the office for clarification of your results.

## 2020-08-23 NOTE — Telephone Encounter (Signed)
Pt is requesting a cheaper alternative for Xifaxan and she also hydrocodone has not been called in.

## 2020-08-24 ENCOUNTER — Ambulatory Visit: Payer: Self-pay | Admitting: Professional

## 2020-08-24 NOTE — Telephone Encounter (Signed)
Xifaxan approved. Left message for patient to call office.

## 2020-08-24 NOTE — Progress Notes (Signed)
Addendum: Reviewed and agree with assessment and management plan. Pyrtle, Jay M, MD  

## 2020-08-27 NOTE — Telephone Encounter (Signed)
Left message for patient to call office.  

## 2020-08-29 NOTE — Telephone Encounter (Signed)
Informed pt of approval

## 2020-08-31 ENCOUNTER — Ambulatory Visit: Payer: Self-pay | Admitting: Professional

## 2020-09-02 ENCOUNTER — Other Ambulatory Visit: Payer: Self-pay

## 2020-09-05 NOTE — Telephone Encounter (Signed)
Linda Becker- Patient is requesting hydrocodone refills.Marland KitchenMarland Kitchen

## 2020-09-06 ENCOUNTER — Other Ambulatory Visit: Payer: Self-pay | Admitting: Gastroenterology

## 2020-09-06 MED ORDER — HYDROCODONE-ACETAMINOPHEN 5-325 MG PO TABS: 1.0000 | ORAL_TABLET | Freq: Two times a day (BID) | ORAL | 0 refills | Status: DC | PRN

## 2020-09-06 MED FILL — HYDROCODON-APAP 5-325: 5-325 | 15 days supply | Qty: 30 | Fill #0

## 2020-09-06 NOTE — Telephone Encounter (Signed)
I was waiting to hear back from Dr. [name] to make sure he was okay with me refilling it.  Go ahead and refill it for 30 pills and no refills.  He would like to have her referred to pain management since he is not really sure where her pain is coming from.  Thank you,  Jess

## 2020-09-06 NOTE — Telephone Encounter (Signed)
Patty, I sent in the prescription.  We please put in a referral to pain management and let the patient know.

## 2020-09-06 NOTE — Addendum Note (Signed)
Addended by: Alonza Bogus D on: 09/06/2020 02:52 PM   Modules accepted: Orders

## 2020-09-07 ENCOUNTER — Ambulatory Visit: Payer: Self-pay | Admitting: Professional

## 2020-09-07 ENCOUNTER — Other Ambulatory Visit: Payer: Self-pay

## 2020-09-07 ENCOUNTER — Telehealth: Payer: Self-pay

## 2020-09-07 NOTE — Telephone Encounter (Signed)
Pain management referral sent to [facility] pain management at [phone] Fax  The pt has been advised she will be getting a call from that office with appt information

## 2020-09-07 NOTE — Telephone Encounter (Signed)
-----   Message from Loralie Champagne, PA-C sent at 09/07/2020  8:43 AM EDT ----- Abdominal pain of unknown source  ----- Message ----- From: Timothy Lasso, RN Sent: 09/06/2020   4:03 PM EDT To: Loralie Champagne, PA-C  Hey what it the referral to pain management for?  What type pain?

## 2020-09-14 ENCOUNTER — Other Ambulatory Visit (HOSPITAL_COMMUNITY): Payer: 59

## 2020-09-14 ENCOUNTER — Ambulatory Visit: Payer: Self-pay | Admitting: Professional

## 2020-09-17 ENCOUNTER — Other Ambulatory Visit (HOSPITAL_COMMUNITY): Payer: Self-pay

## 2020-09-17 MED FILL — Promethazine HCl Tab 25 MG: ORAL | 30 days supply | Qty: 120 | Fill #0 | Status: AC

## 2020-09-17 MED FILL — Pantoprazole Sodium EC Tab 40 MG (Base Equiv): ORAL | 90 days supply | Qty: 90 | Fill #0 | Status: AC

## 2020-09-18 ENCOUNTER — Other Ambulatory Visit (HOSPITAL_COMMUNITY): Payer: Self-pay

## 2020-09-18 MED FILL — Ondansetron Orally Disintegrating Tab 8 MG: ORAL | 20 days supply | Qty: 60 | Fill #0 | Status: CN

## 2020-09-18 MED FILL — Montelukast Sodium Tab 10 MG (Base Equiv): ORAL | 90 days supply | Qty: 90 | Fill #0 | Status: CN

## 2020-09-18 MED FILL — Albuterol Sulfate Inhal Aero 108 MCG/ACT (90MCG Base Equiv): RESPIRATORY_TRACT | 17 days supply | Qty: 8.5 | Fill #0 | Status: CN

## 2020-09-18 MED FILL — Prednisone Tab 10 MG: ORAL | 12 days supply | Qty: 39 | Fill #0 | Status: CN

## 2020-09-19 ENCOUNTER — Other Ambulatory Visit (HOSPITAL_COMMUNITY): Payer: Self-pay

## 2020-09-20 NOTE — Progress Notes (Signed)
Attempted to obtain medical history via telephone, unable to reach at this time. I left a voicemail to return pre surgical testing department's phone call.  

## 2020-09-21 ENCOUNTER — Other Ambulatory Visit (HOSPITAL_COMMUNITY): Payer: Self-pay

## 2020-09-21 MED ORDER — ARIPIPRAZOLE 5 MG PO TABS
5.0000 mg | ORAL_TABLET | Freq: Every day | ORAL | 0 refills | Status: DC
  Filled 2020-09-21: qty 30, 30d supply, fill #0

## 2020-09-21 MED ORDER — GABAPENTIN 300 MG PO CAPS
ORAL_CAPSULE | ORAL | 0 refills | Status: DC
  Filled 2020-09-21: qty 90, 30d supply, fill #0

## 2020-09-21 MED ORDER — LAMOTRIGINE 25 MG PO TABS
ORAL_TABLET | ORAL | 0 refills | Status: DC
  Filled 2020-09-21: qty 42, 30d supply, fill #0

## 2020-09-25 NOTE — Anesthesia Preprocedure Evaluation (Signed)
Anesthesia Evaluation  Patient identified by MRN, date of birth, ID band Patient awake    Reviewed: Allergy & Precautions, NPO status , Patient's Chart, lab work & pertinent test results  Airway Mallampati: II  TM Distance: >3 FB Neck ROM: Full    Dental no notable dental hx.    Pulmonary asthma ,    Pulmonary exam normal breath sounds clear to auscultation       Cardiovascular negative cardio ROS Normal cardiovascular exam Rhythm:Regular Rate:Normal     Neuro/Psych Bipolar Disorder negative neurological ROS     GI/Hepatic GERD  Medicated,(+) Cirrhosis       ,   Endo/Other  negative endocrine ROS  Renal/GU negative Renal ROS  negative genitourinary   Musculoskeletal negative musculoskeletal ROS (+)   Abdominal   Peds negative pediatric ROS (+)  Hematology negative hematology ROS (+)   Anesthesia Other Findings   Reproductive/Obstetrics negative OB ROS                             Anesthesia Physical Anesthesia Plan  ASA: III  Anesthesia Plan: MAC   Post-op Pain Management:    Induction: Intravenous  PONV Risk Score and Plan: 2 and Propofol infusion and Treatment may vary due to age or medical condition  Airway Management Planned: Simple Face Mask  Additional Equipment:   Intra-op Plan:   Post-operative Plan:   Informed Consent: I have reviewed the patients History and Physical, chart, labs and discussed the procedure including the risks, benefits and alternatives for the proposed anesthesia with the patient or authorized representative who has indicated his/her understanding and acceptance.     Dental advisory given  Plan Discussed with: CRNA and Surgeon  Anesthesia Plan Comments:         Anesthesia Quick Evaluation

## 2020-09-26 ENCOUNTER — Encounter (HOSPITAL_COMMUNITY)
Admission: RE | Disposition: A | Discharge status: Home / Self Care | Payer: Self-pay | Source: Home / Self Care | Attending: Internal Medicine

## 2020-09-26 ENCOUNTER — Encounter (HOSPITAL_COMMUNITY): Payer: Self-pay | Admitting: Internal Medicine

## 2020-09-26 ENCOUNTER — Ambulatory Visit (HOSPITAL_COMMUNITY): Payer: Self-pay | Admitting: Certified Registered Nurse Anesthetist

## 2020-09-26 ENCOUNTER — Ambulatory Visit (HOSPITAL_COMMUNITY)
Admission: RE | Admit: 2020-09-26 | Discharge: 2020-09-26 | Disposition: A | Discharge status: Home / Self Care | Payer: Self-pay | Attending: Internal Medicine | Admitting: Internal Medicine

## 2020-09-26 ENCOUNTER — Other Ambulatory Visit: Payer: Self-pay

## 2020-09-26 ENCOUNTER — Other Ambulatory Visit (HOSPITAL_COMMUNITY): Payer: Self-pay

## 2020-09-26 DIAGNOSIS — K3189 Other diseases of stomach and duodenum: Secondary | ICD-10-CM

## 2020-09-26 DIAGNOSIS — R1010 Upper abdominal pain, unspecified: Secondary | ICD-10-CM

## 2020-09-26 DIAGNOSIS — Z79899 Other long term (current) drug therapy: Secondary | ICD-10-CM | POA: Insufficient documentation

## 2020-09-26 DIAGNOSIS — Z880 Allergy status to penicillin: Secondary | ICD-10-CM | POA: Insufficient documentation

## 2020-09-26 DIAGNOSIS — K2970 Gastritis, unspecified, without bleeding: Secondary | ICD-10-CM

## 2020-09-26 DIAGNOSIS — K2990 Gastroduodenitis, unspecified, without bleeding: Secondary | ICD-10-CM

## 2020-09-26 DIAGNOSIS — Z888 Allergy status to other drugs, medicaments and biological substances status: Secondary | ICD-10-CM | POA: Insufficient documentation

## 2020-09-26 DIAGNOSIS — K7581 Nonalcoholic steatohepatitis (NASH): Secondary | ICD-10-CM | POA: Insufficient documentation

## 2020-09-26 DIAGNOSIS — Z9884 Bariatric surgery status: Secondary | ICD-10-CM | POA: Insufficient documentation

## 2020-09-26 DIAGNOSIS — K7469 Other cirrhosis of liver: Secondary | ICD-10-CM | POA: Insufficient documentation

## 2020-09-26 DIAGNOSIS — E669 Obesity, unspecified: Secondary | ICD-10-CM | POA: Insufficient documentation

## 2020-09-26 DIAGNOSIS — K259 Gastric ulcer, unspecified as acute or chronic, without hemorrhage or perforation: Secondary | ICD-10-CM | POA: Insufficient documentation

## 2020-09-26 DIAGNOSIS — I8510 Secondary esophageal varices without bleeding: Secondary | ICD-10-CM | POA: Insufficient documentation

## 2020-09-26 DIAGNOSIS — K449 Diaphragmatic hernia without obstruction or gangrene: Secondary | ICD-10-CM | POA: Insufficient documentation

## 2020-09-26 DIAGNOSIS — R112 Nausea with vomiting, unspecified: Secondary | ICD-10-CM | POA: Insufficient documentation

## 2020-09-26 DIAGNOSIS — K766 Portal hypertension: Secondary | ICD-10-CM | POA: Insufficient documentation

## 2020-09-26 HISTORY — PX: ESOPHAGOGASTRODUODENOSCOPY (EGD) WITH PROPOFOL: SHX5813

## 2020-09-26 HISTORY — PX: BIOPSY: SHX5522

## 2020-09-26 LAB — PREGNANCY, URINE: Preg Test, Ur: NEGATIVE

## 2020-09-26 SURGERY — ESOPHAGOGASTRODUODENOSCOPY (EGD) WITH PROPOFOL
Anesthesia: Monitor Anesthesia Care

## 2020-09-26 MED ORDER — PROPOFOL 1000 MG/100ML IV EMUL
INTRAVENOUS | Status: AC
  Filled 2020-09-26: qty 100

## 2020-09-26 MED ORDER — HYDROCODONE-ACETAMINOPHEN 5-325 MG PO TABS
ORAL_TABLET | ORAL | 0 refills | Status: DC
  Filled 2020-09-26: qty 42, 21d supply, fill #0

## 2020-09-26 MED ORDER — PROPOFOL 500 MG/50ML IV EMUL
INTRAVENOUS | Status: DC | PRN
  Administered 2020-09-26: 125 ug/kg/min via INTRAVENOUS

## 2020-09-26 MED ORDER — PROPOFOL 10 MG/ML IV BOLUS
INTRAVENOUS | Status: DC | PRN
  Administered 2020-09-26: 30 mg via INTRAVENOUS
  Administered 2020-09-26: 40 mg via INTRAVENOUS
  Administered 2020-09-26: 30 mg via INTRAVENOUS
  Administered 2020-09-26 (×2): 40 mg via INTRAVENOUS
  Administered 2020-09-26: 30 mg via INTRAVENOUS
  Administered 2020-09-26: 20 mg via INTRAVENOUS
  Administered 2020-09-26: 30 mg via INTRAVENOUS

## 2020-09-26 MED ORDER — LACTATED RINGERS IV SOLN: INTRAVENOUS | Status: DC

## 2020-09-26 SURGICAL SUPPLY — 14 items

## 2020-09-26 NOTE — Op Note (Signed)
[facility] Patient Name: Linda Becker Procedure Date: 09/26/2020 MRN: [id number] Attending MD: Jerene Bears , MD Date of Birth: November 29, 1990 CSN: [id number] Age: 30 Admit Type: Inpatient Procedure:                Upper GI endoscopy Indications:              Follow-up of esophageal varices (history of upper                            GI bleed with variceal banding 01/27/2020, follow-up                            and last EGD 03/12/2020 with small varices not                            requiring banding), ongoing upper abdominal pain,                            nausea and vomiting Providers:                [name]. [name], MD, Benay Pillow, RN, Elspeth Cho Tech., Technician, Dellie Catholic Referring MD:             Willis Modena, MD; [facility] Medicines:                Monitored Anesthesia Care Complications:            No immediate complications. Estimated Blood Loss:     Estimated blood loss was minimal. Procedure:                Pre-Anesthesia Assessment:                           - Prior to the procedure, a History and Physical                            was performed, and patient medications and                            allergies were reviewed. The patient's tolerance of                            previous anesthesia was also reviewed. The risks                            and benefits of the procedure and the sedation                            options and risks were discussed with the patient.                            All questions were answered, and informed consent  was obtained. Prior Anticoagulants: The patient has                            taken no previous anticoagulant or antiplatelet                            agents. ASA Grade Assessment: III - A patient with                            severe systemic disease. After reviewing the risks                            and benefits, the patient was  deemed in                            satisfactory condition to undergo the procedure.                           After obtaining informed consent, the endoscope was                            passed under direct vision. Throughout the                            procedure, the patient's blood pressure, pulse, and                            oxygen saturations were monitored continuously. The                            GIF-H190 ([id number]) Olympus gastroscope was                            introduced through the mouth, and advanced to the                            second part of duodenum. The upper GI endoscopy was                            accomplished without difficulty. The patient                            tolerated the procedure well. Scope In: Scope Out: Findings:      Grade I, small (< 5 mm) varices which flattened completely with       insufflation were found in the lower third of the esophagus. No stigmata       of recent bleeding. There is scarring in the distal esophagus from prior       band ligation.      A small, 1-2 cm, hiatal hernia was present.      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      Moderately erythematous, striped and nodular mucosa without bleeding was       found in the gastric antrum. Query GAVE. Biopsies were  taken with a cold       forceps for histology.      Patchy nodular mucosa was found in the second portion of the duodenum.       Biopsies were taken with a cold forceps for histology. No ulcerations or       erosions. Impression:               - Grade I and small (< 5 mm) esophageal varices                            which flattened completely with insufflation. Not                            amenable to EVL today. Evidence of scarring in the                            distal esophagus from prior esophageal band                            ligation..                           - 1-2 cm hiatal hernia.                           -  A sleeve gastrectomy was found.                           - Striped erythematous and nodular mucosa in the                            antrum. Biopsied.                           - Nodular mucosa in the second portion of the                            duodenum. Biopsied. Moderate Sedation:      N/A Recommendation:           - Patient has a contact number available for                            emergencies. The signs and symptoms of potential                            delayed complications were discussed with the                            patient. Return to normal activities tomorrow.                            Written discharge instructions were provided to the                            patient.                           -  Resume previous diet.                           - Continue present medications.                           - Await pathology results.                           - Repeat upper endoscopy in 1 year for surveillance.                           - Unclear source for ongoing upper abdominal pain.                            CT imaging from Aug 2021 suggested gallbladder                            distention with hazy edema raising concern for                            cholecystitis (follow-up ultrasound was negative)                            along with questionable mild peripancreatic                            inflammatory change about the proximal pancreas                            versus edema from inflammatory process at                            gallbladder. Given these findings and ongoing upper                            abdominal pain I would recommend we repeat a CT                            scan of the abdomen pelvis with contrast. Attention                            to pancreas and gallbladder. This can be ordered as                            an outpatient.                           - Continue follow-up with Dr. Loletha Grayer and [facility]                             Hepatology. Procedure Code(s):        --- Professional ---  12458, Esophagogastroduodenoscopy, flexible,                            transoral; with biopsy, single or multiple Diagnosis Code(s):        --- Professional ---                           I85.00, Esophageal varices without bleeding                           K44.9, Diaphragmatic hernia without obstruction or                            gangrene                           K31.89, Other diseases of stomach and duodenum                           Z98.84, Bariatric surgery status CPT copyright 2019 American Medical Association. All rights reserved. The codes documented in this report are preliminary and upon coder review may  be revised to meet current compliance requirements. Jerene Bears, MD 09/26/2020 9:33:28 AM This report has been signed electronically. Number of Addenda: 0

## 2020-09-26 NOTE — Anesthesia Postprocedure Evaluation (Signed)
Anesthesia Post Note  Patient: [name]  Procedure(Becker) Performed: ESOPHAGOGASTRODUODENOSCOPY (EGD) WITH PROPOFOL (N/A ) BIOPSY     Patient location during evaluation: PACU Anesthesia Type: MAC Level of consciousness: awake and alert Pain management: pain level controlled Vital Signs Assessment: post-procedure vital signs reviewed and stable Respiratory status: spontaneous breathing, nonlabored ventilation, respiratory function stable and patient connected to nasal cannula oxygen Cardiovascular status: stable and blood pressure returned to baseline Postop Assessment: no apparent nausea or vomiting Anesthetic complications: no   No complications documented.  Last Vitals:  Vitals:   09/26/20 0930 09/26/20 0940  BP: (!) 122/54 (!) 104/56  Pulse: (!) 114 (!) 106  Resp: (!) 22 18  Temp:    SpO2: 100% 100%    Last Pain:  Vitals:   09/26/20 0940  TempSrc:   PainSc: 0-No pain                 Linda Becker

## 2020-09-26 NOTE — Discharge Instructions (Signed)
YOU HAD AN ENDOSCOPIC PROCEDURE TODAY: Refer to the procedure report and other information in the discharge instructions given to you for any specific questions about what was found during the examination. If this information does not answer your questions, please call [place] office at [phone] to clarify.   YOU SHOULD EXPECT: Some feelings of bloating in the abdomen. Passage of more gas than usual. Walking can help get rid of the air that was put into your GI tract during the procedure and reduce the bloating. If you had a lower endoscopy (such as a colonoscopy or flexible sigmoidoscopy) you may notice spotting of blood in your stool or on the toilet paper. Some abdominal soreness may be present for a day or two, also.  DIET: Your first meal following the procedure should be a light meal and then it is ok to progress to your normal diet. A half-sandwich or bowl of soup is an example of a good first meal. Heavy or fried foods are harder to digest and may make you feel nauseous or bloated. Drink plenty of fluids but you should avoid alcoholic beverages for 24 hours. If you had a esophageal dilation, please see attached instructions for diet.    ACTIVITY: Your care partner should take you home directly after the procedure. You should plan to take it easy, moving slowly for the rest of the day. You can resume normal activity the day after the procedure however YOU SHOULD NOT DRIVE, use power tools, machinery or perform tasks that involve climbing or major physical exertion for 24 hours (because of the sedation medicines used during the test).   SYMPTOMS TO REPORT IMMEDIATELY: A gastroenterologist can be reached at any hour. Please call [phone]  for any of the following symptoms:   Following upper endoscopy (EGD, EUS, ERCP, esophageal dilation) Vomiting of blood or coffee ground material  New, significant abdominal pain  New, significant chest pain or pain under the shoulder blades  Painful or  persistently difficult swallowing  New shortness of breath  Black, tarry-looking or red, bloody stools  FOLLOW UP:  If any biopsies were taken you will be contacted by phone or by letter within the next 1-3 weeks. Call [phone]  if you have not heard about the biopsies in 3 weeks.  Please also call with any specific questions about appointments or follow up tests.  

## 2020-09-26 NOTE — Anesthesia Procedure Notes (Signed)
Procedure Name: MAC Date/Time: 09/26/2020 8:50 AM Performed by: Claudia Desanctis, CRNA Pre-anesthesia Checklist: Patient identified, Emergency Drugs available, Suction available and Patient being monitored Patient Re-evaluated:Patient Re-evaluated prior to induction Oxygen Delivery Method: Simple face mask

## 2020-09-26 NOTE — H&P (Signed)
HPI: Linda Becker is a 30 year old female with NASH cirrhosis with portal hypertension complicated by history of bleeding esophageal varices, thrombocytopenia, obesity status post gastric sleeve, sessile serrated polyps, chronic upper abdominal pain with nausea and vomiting who presents for outpatient endoscopy.  She reports she continues to have issues with upper abdominal pain and nausea.  She is using hydrocodone 5 mg twice daily.  She has a pain clinic appointment later this month.  She is using Zofran alternating with promethazine.  Bowel movements have been mostly loose but nonbloody and nonmelenic.  Several times in the last month her emesis has had scant heme.  She feels overall sluggish.  Past Medical History:  Diagnosis Date  . Allergic rhinitis    remote hx of allergy shots  . Asthma    since childhood  on controller meds extrinsic dr Carmelina Peal  . Bipolar II disorder ([place])   . Cirrhosis ([place])   . Esophageal varices ([place])   . Family history of adverse reaction to anesthesia    mom and sister difficult to awaken per patient   . Gallbladder sludge   . Gastropathy   . GERD (gastroesophageal reflux disease)    on nexium  for  long term sx since childhood  . H/O miscarriage, not currently pregnant    [redacted] weeks  march 2016  . Hepatic steatosis   . Hypothyroidism   . Migraine   . Murmur    pt reports MVP  . Portal hypertension ([place])   . Sessile colonic polyp   . Splenomegaly   . Steatohepatitis   . Syncope    under eval ? cause  . Tachycardia    episodes with near syncope eval dr Nadyne Coombes  on no meds dced LABA  . Thrombocytopenia ([place])   . Upper GI bleed     Past Surgical History:  Procedure Laterality Date  . BIOPSY  09/03/2018   Procedure: BIOPSY;  Surgeon: Jerene Bears, MD;  Location: [facility];  Service: Gastroenterology;;  . broken right femur  2008   rod placed  . COLONOSCOPY WITH PROPOFOL N/A 09/03/2018   Procedure: COLONOSCOPY WITH PROPOFOL;  Surgeon: Jerene Bears,  MD;  Location: [facility];  Service: Gastroenterology;  Laterality: N/A;  . DILATION AND CURETTAGE OF UTERUS    . ESOPHAGOGASTRODUODENOSCOPY (EGD) WITH PROPOFOL N/A 09/03/2018   Procedure: ESOPHAGOGASTRODUODENOSCOPY (EGD) WITH PROPOFOL;  Surgeon: Jerene Bears, MD;  Location: [facility];  Service: Gastroenterology;  Laterality: N/A;  . ESOPHAGOGASTRODUODENOSCOPY (EGD) WITH PROPOFOL N/A 01/27/2020   Procedure: ESOPHAGOGASTRODUODENOSCOPY (EGD) WITH PROPOFOL;  Surgeon: Rush Landmark Telford Nab., MD;  Location: [place];  Service: Gastroenterology;  Laterality: N/A;  . ESOPHAGOGASTRODUODENOSCOPY (EGD) WITH PROPOFOL N/A 03/12/2020   Procedure: ESOPHAGOGASTRODUODENOSCOPY (EGD) WITH PROPOFOL;  Surgeon: Jerene Bears, MD;  Location: [facility];  Service: Gastroenterology;  Laterality: N/A;  . GASTRIC VARICES BANDING  01/27/2020   Procedure: GASTRIC VARICES BANDING;  Surgeon: Rush Landmark Telford Nab., MD;  Location: [place];  Service: Gastroenterology;;  . IR TRANSCATHETER BX  01/19/2019  . IR US GUIDE VASC ACCESS RIGHT  01/19/2019  . IR VENOGRAM HEPATIC W HEMODYNAMIC EVALUATION  01/19/2019  . LAPAROSCOPIC GASTRIC SLEEVE RESECTION N/A 06/28/2019   Procedure: LAPAROSCOPIC GASTRIC SLEEVE RESECTION, Upper Endo, ERAS Pathway;  Surgeon: Clovis Riley, MD;  Location: [facility];  Service: General;  Laterality: N/A;  . OB ultrasound N/A 12/01/2017   see report  . POLYPECTOMY  09/03/2018   Procedure: POLYPECTOMY;  Surgeon: Jerene Bears, MD;  Location: [facility];  Service: Gastroenterology;;  . TONSILLECTOMY  2006  . WISDOM TOOTH EXTRACTION      (Not in an outpatient encounter)   Allergies  Allergen Reactions  . Progesterone Rash    Was in a form of birth control.  . Penicillins Rash    Did it involve swelling of the face/tongue/throat, SOB, or low BP? No Did it involve sudden or severe rash/hives, skin peeling, or any reaction on the inside of your mouth or nose? Yes Did you need to seek medical  attention at a hospital or doctor's office? No When did it last happen?9 + months If all above answers are "NO", may proceed with cephalosporin use.      Family History  Problem Relation Age of Onset  . Asthma Father   . Arthritis Father   . Hyperlipidemia Father   . Heart disease Father   . Hypertension Father   . Diabetes Father   . Colon polyps Father   . Arthritis Mother   . Hyperlipidemia Mother   . Heart disease Mother   . Hypertension Mother   . Diabetes Mother   . Kidney disease Mother   . Liver disease Mother        advanced non-alcoholic cirrhosis  . Bleeding Disorder Mother   . Colon polyps Mother   . Arthritis Maternal Grandmother   . Breast cancer Maternal Grandmother   . Thyroid disease Maternal Grandmother   . Colon cancer Paternal Grandfather   . Diabetes Paternal Grandfather   . Liver cancer Paternal Grandfather   . Stomach cancer Paternal Grandfather   . Breast cancer Paternal Grandmother   . Diabetes Paternal Grandmother   . Breast cancer Paternal Aunt   . Esophageal cancer Paternal Aunt   . Breast cancer Maternal Aunt   . Diabetes Maternal Grandfather   . Pancreatic cancer Maternal Grandfather     Social History   Tobacco Use  . Smoking status: Never Smoker  . Smokeless tobacco: Never Used  Vaping Use  . Vaping Use: Never used  Substance Use Topics  . Alcohol use: No    Alcohol/week: 0.0 standard drinks  . Drug use: No    ROS: As per history of present illness, otherwise negative  BP (!) 126/55   Temp 98.4 F (36.9 C) (Oral)   Resp 19   Ht 5' 5"  (1.651 m)   Wt 126.1 kg   LMP 09/05/2020   SpO2 100%   BMI 46.26 kg/m  Gen: awake, alert, NAD HEENT: anicteric, op clear CV: RRR, no mrg Pulm: CTA b/l Abd: soft, NT/ND, +BS throughout Ext: no c/c/e Neuro: nonfocal  RELEVANT LABS AND IMAGING: CBC    Component Value Date/Time   WBC 2.8 (L) 06/22/2020 1011   RBC 4.39 06/22/2020 1011   HGB 13.5 06/22/2020 1011   HGB 13.0  02/07/2020 1412   HGB 14.7 01/14/2020 1309   HCT 40.0 06/22/2020 1011   HCT 47.1 (H) 01/14/2020 1309   PLT 61.0 (L) 06/22/2020 1011   PLT 63 (L) 02/07/2020 1412   PLT 64 (L) 01/14/2020 1309   MCV 91.1 06/22/2020 1011   MCV 100.9 (H) 01/14/2020 1309   MCH 31.8 02/07/2020 1412   MCHC 33.7 06/22/2020 1011   RDW 14.3 06/22/2020 1011   RDW 13.5 01/14/2020 1309   LYMPHSABS 1.0 06/22/2020 1011   LYMPHSABS 1.3 01/14/2020 1309   MONOABS 0.1 06/22/2020 1011   EOSABS 0.1 06/22/2020 1011   EOSABS 0.1 01/14/2020 1309   BASOSABS 0.0 06/22/2020  1011   BASOSABS 0.0 01/14/2020 1309    CMP     Component Value Date/Time   NA 140 06/22/2020 1011   NA 145 01/14/2020 1309   K 4.5 06/22/2020 1011   CL 107 06/22/2020 1011   CO2 29 06/22/2020 1011   GLUCOSE 90 06/22/2020 1011   BUN 11 06/22/2020 1011   BUN 12 01/14/2020 1309   CREATININE 0.80 06/22/2020 1011   CREATININE 0.84 02/07/2020 1412   CALCIUM 9.0 06/22/2020 1011   PROT 6.6 06/22/2020 1011   PROT 6.2 (L) 01/14/2020 1309   ALBUMIN 4.2 06/22/2020 1011   ALBUMIN 3.9 01/14/2020 1309   AST 18 06/22/2020 1011   AST 31 02/07/2020 1412   ALT 13 06/22/2020 1011   ALT 47 (H) 02/07/2020 1412   ALKPHOS 87 06/22/2020 1011   BILITOT 0.6 06/22/2020 1011   BILITOT 0.3 02/07/2020 1412   GFRNONAA >60 02/07/2020 1412   GFRAA >60 02/07/2020 1412    ASSESSMENT/PLAN:  30 year old female with NASH cirrhosis with portal hypertension complicated by history of bleeding esophageal varices, thrombocytopenia, obesity status post gastric sleeve, sessile serrated polyps, chronic upper abdominal pain with nausea and vomiting who presents for outpatient endoscopy.  1.  Decompensated cirrhosis with history of variceal bleed --surveillance upper endoscopy today.  Banding possible if varices are large.  Also further evaluation of unexplained upper abdominal pain and nausea.  The nature of the procedure, as well as the risks, benefits, and alternatives were  carefully and thoroughly reviewed with the patient. Ample time for discussion and questions allowed. The patient understood, was satisfied, and agreed to proceed.

## 2020-09-26 NOTE — Transfer of Care (Signed)
Immediate Anesthesia Transfer of Care Note  Patient: Linda Becker  Procedure(s) Performed: ESOPHAGOGASTRODUODENOSCOPY (EGD) WITH PROPOFOL (N/A ) BIOPSY  Patient Location: PACU  Anesthesia Type:MAC  Level of Consciousness: awake and patient cooperative  Airway & Oxygen Therapy: Patient Spontanous Breathing and Patient connected to face mask  Post-op Assessment: Report given to RN and Post -op Vital signs reviewed and stable  Post vital signs: Reviewed and stable  Last Vitals:  Vitals Value Taken Time  BP    Temp    Pulse    Resp    SpO2      Last Pain:  Vitals:   09/26/20 0744  TempSrc: Oral  PainSc: 0-No pain         Complications: No complications documented.

## 2020-09-27 ENCOUNTER — Encounter (HOSPITAL_COMMUNITY): Payer: Self-pay | Admitting: Internal Medicine

## 2020-09-27 LAB — SURGICAL PATHOLOGY

## 2020-10-01 ENCOUNTER — Telehealth: Payer: Self-pay | Admitting: Internal Medicine

## 2020-10-01 ENCOUNTER — Other Ambulatory Visit: Payer: Self-pay

## 2020-10-01 ENCOUNTER — Other Ambulatory Visit (HOSPITAL_COMMUNITY): Payer: Self-pay

## 2020-10-01 DIAGNOSIS — K7581 Nonalcoholic steatohepatitis (NASH): Secondary | ICD-10-CM

## 2020-10-01 DIAGNOSIS — R1084 Generalized abdominal pain: Secondary | ICD-10-CM

## 2020-10-01 MED ORDER — RIFAXIMIN 550 MG PO TABS
550.0000 mg | ORAL_TABLET | Freq: Two times a day (BID) | ORAL | 3 refills | Status: DC
  Filled 2020-10-01 – 2021-01-09 (×4): qty 60, 30d supply, fill #0

## 2020-10-01 MED ORDER — PROMETHAZINE HCL 25 MG PO TABS
25.0000 mg | ORAL_TABLET | Freq: Four times a day (QID) | ORAL | 3 refills | Status: DC | PRN
  Filled 2020-10-01 – 2020-10-15 (×2): qty 30, 8d supply, fill #0
  Filled 2020-10-27: qty 30, 8d supply, fill #1

## 2020-10-01 MED ORDER — HYDROCODONE-ACETAMINOPHEN 5-325 MG PO TABS
ORAL_TABLET | ORAL | 0 refills | Status: DC
  Filled 2020-10-01: qty 42, fill #0
  Filled 2020-10-02: qty 42, 21d supply, fill #0

## 2020-10-01 MED ORDER — PANTOPRAZOLE SODIUM 40 MG PO TBEC
40.0000 mg | DELAYED_RELEASE_TABLET | Freq: Two times a day (BID) | ORAL | 3 refills | Status: DC
  Filled 2020-10-01 (×2): qty 60, 30d supply, fill #0
  Filled 2020-11-22: qty 60, 30d supply, fill #1

## 2020-10-01 MED ORDER — NADOLOL 20 MG PO TABS
20.0000 mg | ORAL_TABLET | Freq: Every day | ORAL | 3 refills | Status: DC
  Filled 2020-10-01: qty 30, 30d supply, fill #0

## 2020-10-01 NOTE — Telephone Encounter (Signed)
Patient called in. Stated she went on vacation and somehow do not know where any of her medications are. She have called the hotel she was staying at and no one have turned in any medicine. She asked if she can get refills on Phenergan, xifraxan, protonix, naodol, and hydrocodone? Pharmacy is [facility]

## 2020-10-01 NOTE — Telephone Encounter (Signed)
Rosanne Sack is taking care of this Thanks

## 2020-10-01 NOTE — Telephone Encounter (Signed)
I am sympathetic to her loss medicines,  however controlled medications cannot be refilled in the future if this happens again Will refill at this time only for this reason Please send noncontrolled refills and let the pharmacy know they are being filled early because they were lost.

## 2020-10-02 ENCOUNTER — Encounter: Payer: Self-pay | Admitting: Internal Medicine

## 2020-10-02 ENCOUNTER — Other Ambulatory Visit (HOSPITAL_COMMUNITY): Payer: Self-pay

## 2020-10-03 ENCOUNTER — Telehealth: Payer: Self-pay

## 2020-10-03 ENCOUNTER — Other Ambulatory Visit: Payer: Self-pay

## 2020-10-03 ENCOUNTER — Other Ambulatory Visit (HOSPITAL_COMMUNITY): Payer: Self-pay

## 2020-10-03 MED ORDER — LAMOTRIGINE 25 MG PO TABS
ORAL_TABLET | ORAL | 0 refills | Status: DC
  Filled 2020-10-03: qty 42, 28d supply, fill #0
  Filled 2020-10-27: qty 42, fill #0
  Filled 2020-10-31: qty 42, 28d supply, fill #0
  Filled 2020-11-21: qty 42, 30d supply, fill #0

## 2020-10-03 MED ORDER — GABAPENTIN 300 MG PO CAPS
ORAL_CAPSULE | ORAL | 1 refills | Status: DC
  Filled 2020-10-03: qty 90, 30d supply, fill #0
  Filled 2020-10-15: qty 90, 30d supply, fill #1

## 2020-10-03 MED ORDER — ARIPIPRAZOLE 5 MG PO TABS
5.0000 mg | ORAL_TABLET | Freq: Every day | ORAL | 1 refills | Status: DC
  Filled 2020-10-03 – 2021-01-09 (×4): qty 30, 30d supply, fill #0

## 2020-10-03 NOTE — Telephone Encounter (Signed)
Order in epic for CT of A/P. Note sent to rad scheduling, they will contact pt regarding scheduling the CT scan.

## 2020-10-06 ENCOUNTER — Other Ambulatory Visit (HOSPITAL_COMMUNITY): Payer: Self-pay

## 2020-10-10 ENCOUNTER — Other Ambulatory Visit (HOSPITAL_COMMUNITY): Payer: Self-pay

## 2020-10-15 ENCOUNTER — Other Ambulatory Visit (HOSPITAL_COMMUNITY): Payer: Self-pay

## 2020-10-27 ENCOUNTER — Other Ambulatory Visit (HOSPITAL_COMMUNITY): Payer: Self-pay

## 2020-10-27 ENCOUNTER — Other Ambulatory Visit: Payer: Self-pay | Admitting: Internal Medicine

## 2020-10-27 MED FILL — Montelukast Sodium Tab 10 MG (Base Equiv): ORAL | 90 days supply | Qty: 90 | Fill #0 | Status: CN

## 2020-10-29 ENCOUNTER — Other Ambulatory Visit (HOSPITAL_COMMUNITY): Payer: Self-pay

## 2020-10-31 ENCOUNTER — Other Ambulatory Visit (HOSPITAL_COMMUNITY): Payer: Self-pay

## 2020-10-31 ENCOUNTER — Other Ambulatory Visit: Payer: Self-pay | Admitting: Internal Medicine

## 2020-11-01 ENCOUNTER — Other Ambulatory Visit (HOSPITAL_COMMUNITY): Payer: Self-pay

## 2020-11-01 MED ORDER — HYDROCODONE-ACETAMINOPHEN 5-325 MG PO TABS
ORAL_TABLET | ORAL | 0 refills | Status: DC
  Filled 2020-11-01: qty 42, 21d supply, fill #0

## 2020-11-01 MED ORDER — PROMETHAZINE HCL 25 MG PO TABS
25.0000 mg | ORAL_TABLET | Freq: Four times a day (QID) | ORAL | 1 refills | Status: DC | PRN
  Filled 2020-11-01: qty 120, 30d supply, fill #0
  Filled 2020-11-22: qty 120, 30d supply, fill #1

## 2020-11-01 NOTE — Telephone Encounter (Signed)
[employer] for promethazine quantity to satisfy q6h PRN I refilled hydrocodone until pain management appt

## 2020-11-05 ENCOUNTER — Other Ambulatory Visit (HOSPITAL_COMMUNITY): Payer: Self-pay

## 2020-11-06 ENCOUNTER — Other Ambulatory Visit (HOSPITAL_COMMUNITY): Payer: Self-pay

## 2020-11-07 ENCOUNTER — Other Ambulatory Visit (HOSPITAL_COMMUNITY): Payer: Self-pay

## 2020-11-20 ENCOUNTER — Other Ambulatory Visit (HOSPITAL_COMMUNITY): Payer: Self-pay

## 2020-11-21 ENCOUNTER — Other Ambulatory Visit: Payer: Self-pay | Admitting: Internal Medicine

## 2020-11-21 ENCOUNTER — Other Ambulatory Visit (HOSPITAL_COMMUNITY): Payer: Self-pay

## 2020-11-21 MED FILL — Montelukast Sodium Tab 10 MG (Base Equiv): ORAL | 90 days supply | Qty: 90 | Fill #0 | Status: AC

## 2020-11-21 NOTE — Telephone Encounter (Signed)
Please advise Sir, thank you. 

## 2020-11-22 ENCOUNTER — Other Ambulatory Visit (HOSPITAL_COMMUNITY): Payer: Self-pay

## 2020-11-22 ENCOUNTER — Other Ambulatory Visit: Payer: Self-pay

## 2020-11-22 MED ORDER — PANTOPRAZOLE SODIUM 40 MG PO TBEC
40.0000 mg | DELAYED_RELEASE_TABLET | Freq: Two times a day (BID) | ORAL | 3 refills | Status: DC
  Filled 2021-01-09: qty 60, 30d supply, fill #0
  Filled 2021-01-24: qty 60, 30d supply, fill #1
  Filled 2021-03-07: qty 60, 30d supply, fill #2
  Filled 2021-03-28: qty 60, 30d supply, fill #3

## 2020-11-22 MED ORDER — PROMETHAZINE HCL 25 MG PO TABS
25.0000 mg | ORAL_TABLET | Freq: Four times a day (QID) | ORAL | 1 refills | Status: DC | PRN
  Filled 2020-11-22: qty 120, 30d supply, fill #0
  Filled 2021-01-09: qty 120, 30d supply, fill #1

## 2020-11-22 MED ORDER — DOXYCYCLINE HYCLATE 100 MG PO CAPS
100.0000 mg | ORAL_CAPSULE | Freq: Two times a day (BID) | ORAL | 0 refills | Status: DC
  Filled 2020-11-22 – 2021-02-22 (×2): qty 14, 7d supply, fill #0

## 2020-11-22 NOTE — Telephone Encounter (Signed)
Okay to refill Protonix and promethazine With tick bite and rash I am going to empirically treat for [facility] spotted fever which would also treat for other tickborne illnesses Doxycycline 100 mg twice daily x7 days If fever or other new symptoms she needs to be seen by urgent care or PCP immediately  When is her pain management appointment?

## 2020-11-22 NOTE — Telephone Encounter (Signed)
Her appointment is 12/04/2020 Sir, if she gets her insurance which she is hopeful for.

## 2020-11-22 NOTE — Telephone Encounter (Signed)
Okay to refill Protonix and promethazine With tick bite and rash I am going to empirically treat for [facility] spotted fever which would also treat for other tickborne illnesses Doxycycline 100 mg twice daily x7 days If fever or other new symptoms she needs to be seen by urgent care or PCP immediately   When is her pain management appointment?

## 2020-11-22 NOTE — Telephone Encounter (Signed)
I see the request and I had been refilling as she was waiting to see pain management I need to know when this appt is scheduled for

## 2020-11-22 NOTE — Telephone Encounter (Signed)
I left [name] a voice mail message on her cell # to call us and let us know the pain management appointment date.

## 2020-11-23 ENCOUNTER — Other Ambulatory Visit (HOSPITAL_COMMUNITY): Payer: Self-pay

## 2020-11-23 MED ORDER — HYDROCODONE-ACETAMINOPHEN 5-325 MG PO TABS
ORAL_TABLET | ORAL | 0 refills | Status: DC
  Filled 2020-11-23 – 2020-11-27 (×3): qty 42, 21d supply, fill #0

## 2020-11-23 NOTE — Telephone Encounter (Signed)
Patient called wondering if she would be getting a refill on the Hydrocodone as well please advise so she can pick up all her medications at once.

## 2020-11-23 NOTE — Telephone Encounter (Signed)
Refill until pain management appt later this month, 12/04/20

## 2020-11-23 NOTE — Telephone Encounter (Signed)
Sir do you think this will be sent in today, thank you.

## 2020-11-27 ENCOUNTER — Other Ambulatory Visit (HOSPITAL_COMMUNITY): Payer: Self-pay

## 2020-11-29 IMAGING — MR MR 3D RECON AT [facility]
16 of 20 series · 36 of 48 positions shown · IV contrast (gadavist)
Comparison: No prior abdominal MRI. CT the abdomen and pelvis
01/27/2020.

CLINICAL DATA: 29-year-old female with history of upper abdominal
pain. History of NASH cirrhosis.

EXAM:
MRI ABDOMEN WITHOUT AND WITH CONTRAST (INCLUDING MRCP)
TECHNIQUE: Multiplanar multisequence MR imaging of the abdomen was performed
both before and after the administration of intravenous contrast.
Heavily T2-weighted images of the biliary and pancreatic ducts were
obtained, and three-dimensional MRCP images were rendered by post
processing.
CONTRAST:  10mL GADAVIST GADOBUTROL 1 MMOL/ML IV SOLN

[Series 3: T2 fat-sat · axial · 6.0mm · 1.25mm/px · 1 of 40 slices shown]
[im 1/40]
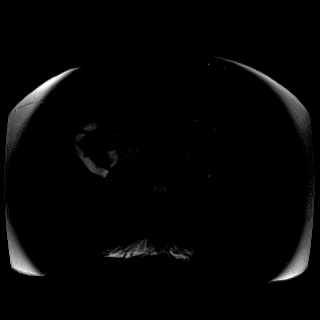

[Series 6: T2 · coronal · 6.0mm · 1.64mm/px · 1 of 30 slices shown (1 of 2)]
[im 1/30]
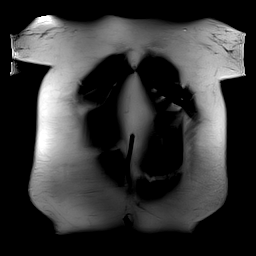

[Series 7: DWI · axial · 6.0mm · 1.49mm/px · z∈[-89,+192]mm · 3 of 80 slices shown (1 of 2)]
[im 1/80]
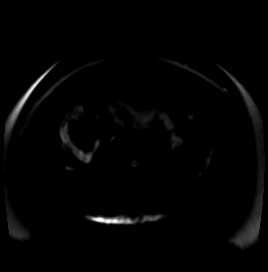
[im 40/80]
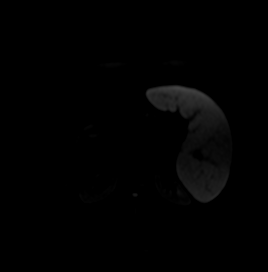
[im 80/80]
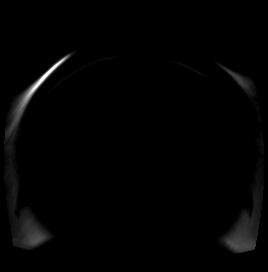

[Series 8: DWI · axial · 6.0mm · 1.49mm/px · 1 of 40 slices shown (2 of 2)]
[im 1/40]
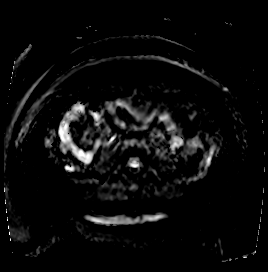

[Series 9: T1 · axial · 4.0mm · 1.25mm/px · z∈[-85,+199]mm · 3 of 72 slices shown (1 of 2)]
[im 1/72]
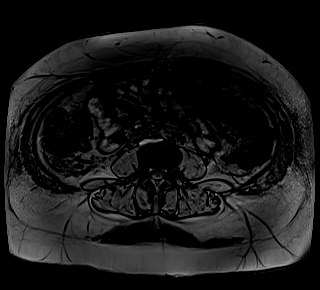
[im 36/72]
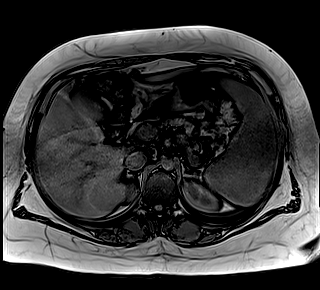
[im 72/72]
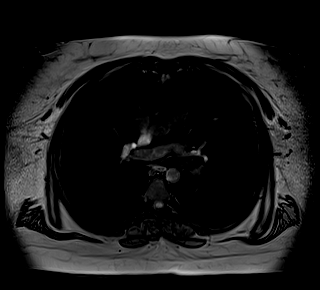

[Series 10: T1 · axial · 4.0mm · 1.25mm/px · z∈[-85,+199]mm · 3 of 72 slices shown (2 of 2)]
[im 1/72]
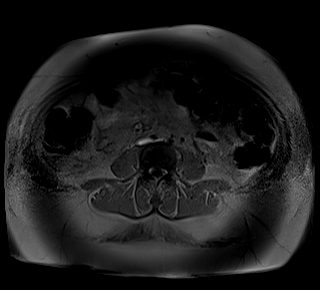
[im 36/72]
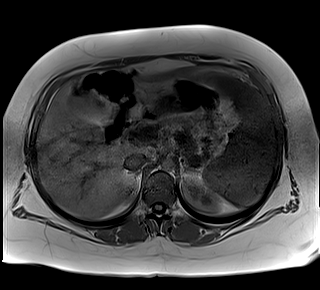
[im 72/72]
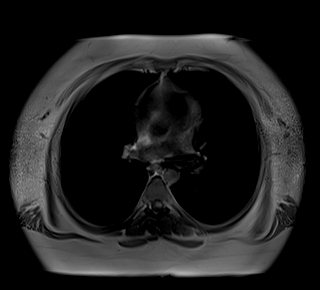

[Series 11: cor obl thk · sagittal · 50.0mm · 0.78mm/px · 1 of 9 slices shown]
[im 1/9]
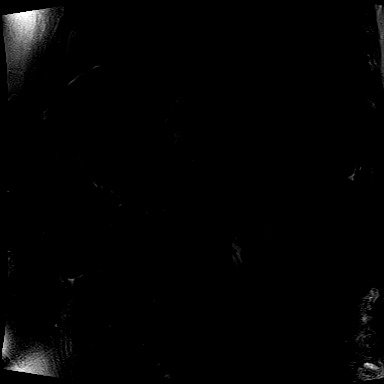

[Series 13: cor_3d_spc_trig · coronal · 1.0mm · 0.49mm/px · 3 of 88 slices shown]
[im 1/88]
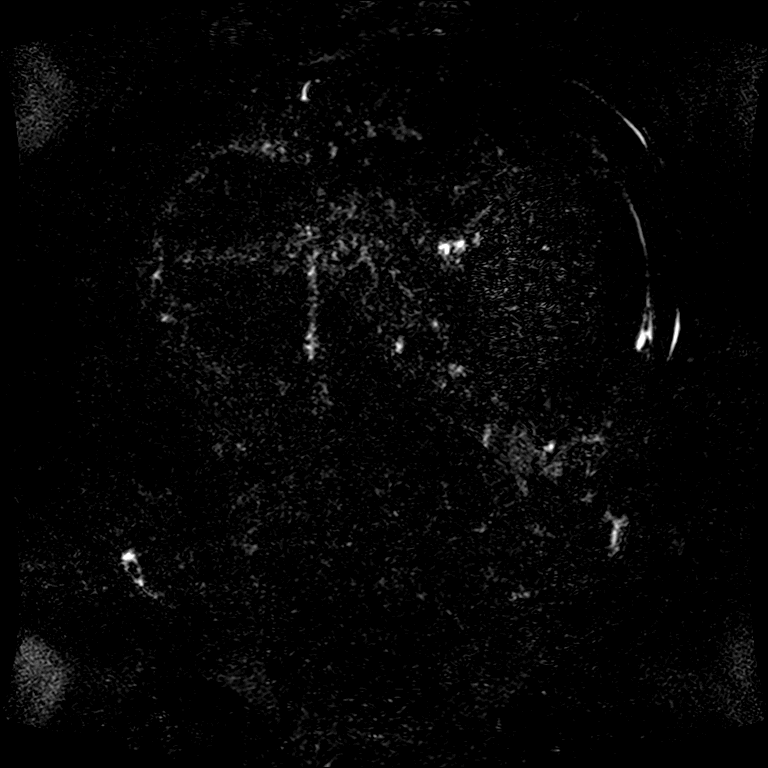
[im 44/88]
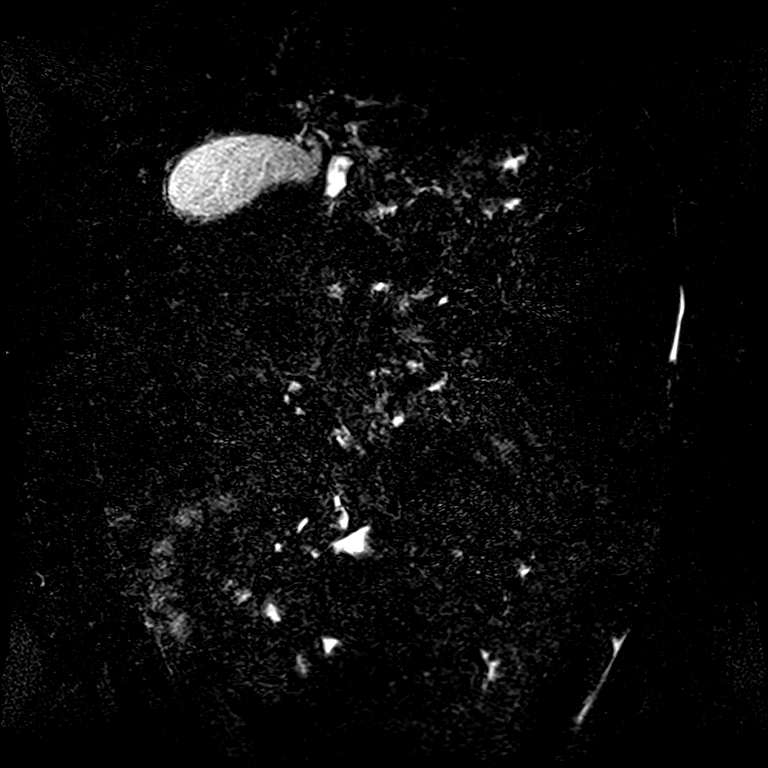
[im 88/88]
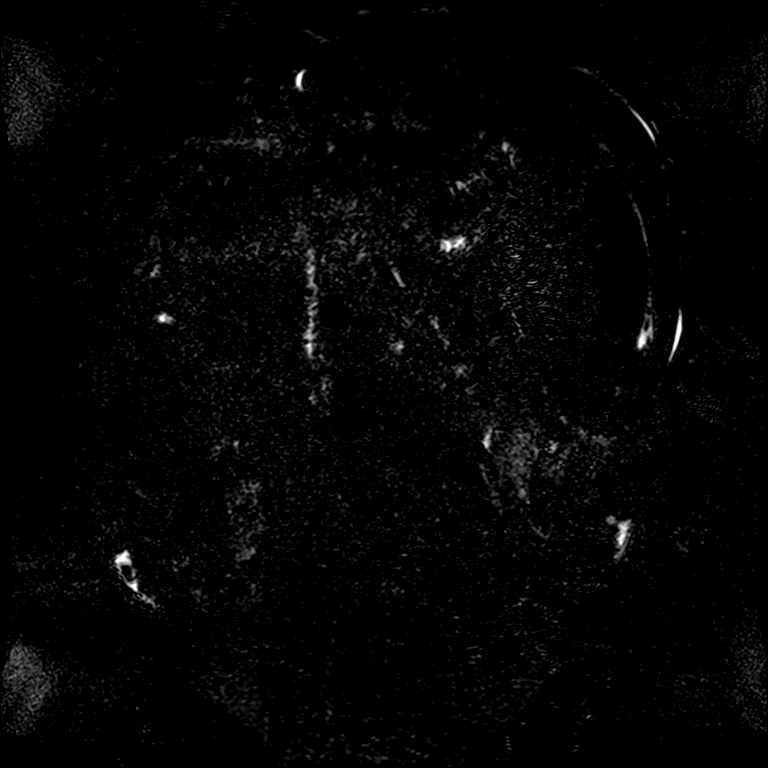

[Series 15: T2 · axial · 6.0mm · 1.64mm/px · z∈[-144,+173]mm · 2 of 45 slices shown (2 of 2)]
[im 1/45]
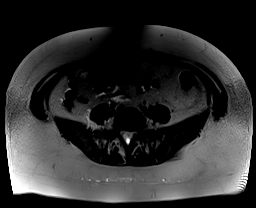
[im 45/45]
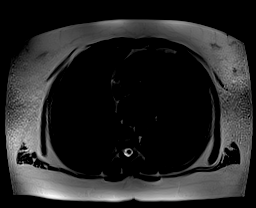

[Series 18: T1 dynamic · axial · 3.0mm · 1.31mm/px · z∈[-97,+164]mm · 3 of 88 slices shown (1 of 6)]
[im 1/88]
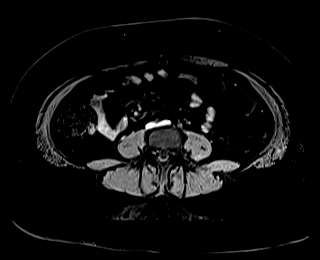
[im 44/88]
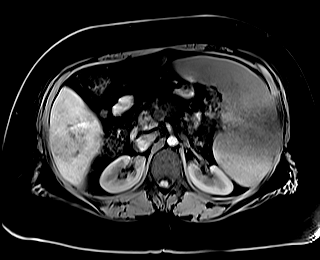
[im 88/88]
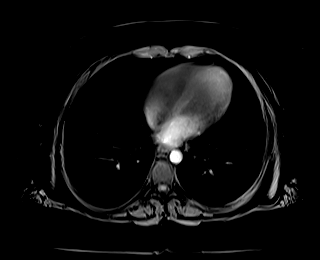

[Series 21: T1 dynamic · axial · 3.0mm · 1.31mm/px · z∈[-97,+164]mm · 3 of 88 slices shown (2 of 6)]
[im 1/88]
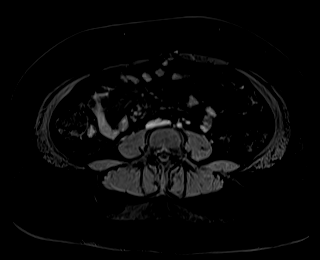
[im 44/88]
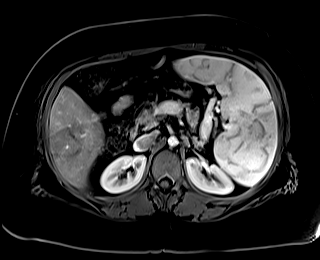
[im 88/88]
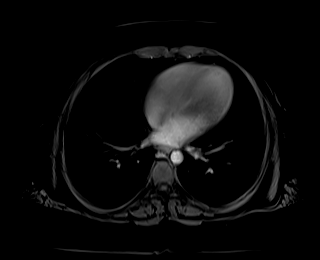

[Series 23: T1 dynamic · axial · 3.0mm · 1.31mm/px · z∈[-97,+164]mm · 3 of 88 slices shown (3 of 6)]
[im 1/88]
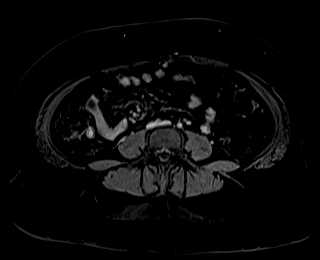
[im 44/88]
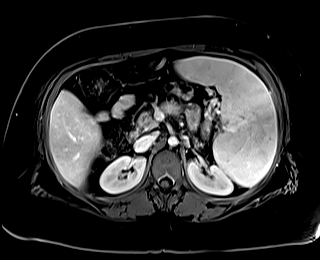
[im 88/88]
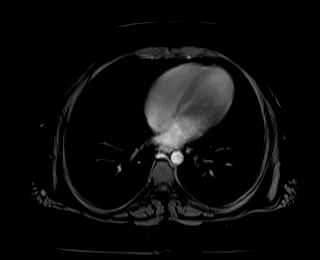

[Series 25: T1 dynamic · axial · 3.0mm · 1.31mm/px · z∈[-97,+164]mm · 3 of 88 slices shown (4 of 6)]
[im 1/88]
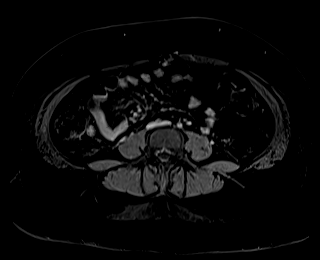
[im 44/88]
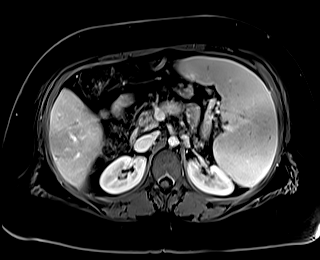
[im 88/88]
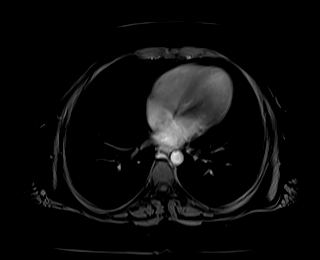

[Series 27: T1 dynamic · coronal · 4.0mm · 1.41mm/px · 2 of 60 slices shown (5 of 6)]
[im 1/60]
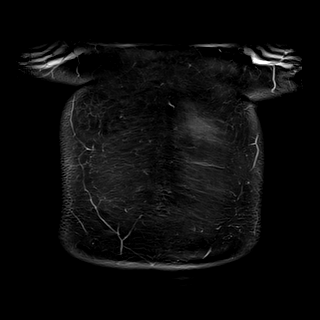
[im 60/60]
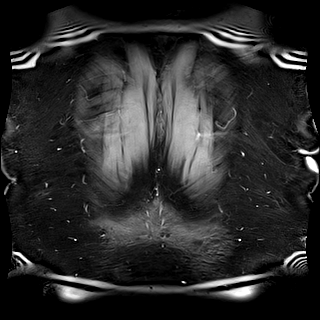

[Series 29: T1 dynamic · axial · 3.0mm · 1.31mm/px · z∈[-97,+164]mm · 3 of 88 slices shown (6 of 6)]
[im 1/88]
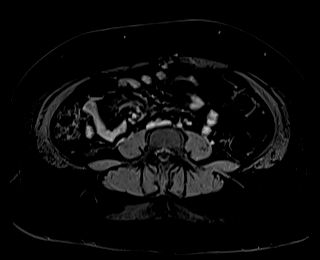
[im 44/88]
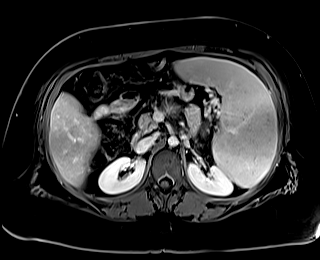
[im 88/88]
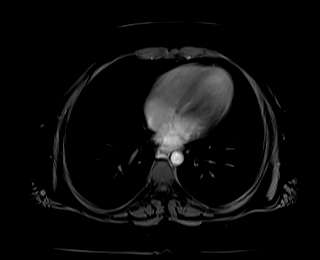

[Series 101: sub 20 sec · axial · 3.0mm · 1.31mm/px · 1 of 88 slices shown]
[im 1/88]
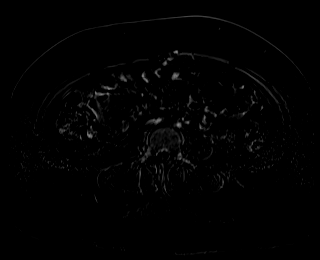

[36 of 48 positions shown; findings below may reference images not displayed]

FINDINGS: Lower chest: Unremarkable.

Hepatobiliary: Very mild loss of signal intensity in the hepatic
parenchyma on out of phase dual echo images, which could suggest
very mild hepatic steatosis. Liver has a slightly nodular contour,
suggesting early changes of cirrhosis. No discrete cystic or solid
hepatic lesions. No intra or extrahepatic biliary ductal dilatation.
Gallbladder is normal in appearance.

Pancreas: No pancreatic mass. No pancreatic ductal dilatation. No
pancreatic or peripancreatic fluid collections or inflammatory
changes.

Spleen: Spleen is enlarged measuring up to 19.2 x 9.6 x 18.2 cm
(estimated splenic volume of 1,677 mL).

Adrenals/Urinary Tract: Bilateral kidneys and adrenal glands are
normal in appearance. No hydroureteronephrosis in the visualized
portions of the abdomen.

Stomach/Bowel: Visualized portions are unremarkable.

Vascular/Lymphatic: No aneurysm identified in the visualized
abdominal vasculature. Portal vein is dilated measuring 17 mm in the
porta hepatis. Splenic vein, superior mesenteric vein, splenoportal
confluence and portal vein are all patent. Recanalized paraumbilical
vein and other portosystemic collateral pathways, including small
gastric and paraesophageal varices. No definite lymphadenopathy
noted in the abdomen.

Other: No significant volume of ascites noted in the visualized
portions of the peritoneal cavity.

Musculoskeletal: No aggressive appearing osseous lesions are noted
in the visualized portions of the skeleton.
IMPRESSION: 1. Mild hepatic steatosis with a suggestion of early hepatic
cirrhosis. There is definitive evidence of portal venous
hypertension, including dilated portal vein, splenomegaly and
portosystemic collateral pathways, as above. No suspicious hepatic
lesions are noted at this time.

## 2020-12-12 DIAGNOSIS — Z0279 Encounter for issue of other medical certificate: Secondary | ICD-10-CM

## 2020-12-20 ENCOUNTER — Other Ambulatory Visit (HOSPITAL_COMMUNITY): Payer: Self-pay

## 2020-12-20 ENCOUNTER — Other Ambulatory Visit: Payer: Self-pay | Admitting: Internal Medicine

## 2020-12-20 MED ORDER — HYDROCODONE-ACETAMINOPHEN 5-325 MG PO TABS
1.0000 | ORAL_TABLET | Freq: Three times a day (TID) | ORAL | 0 refills | Status: DC | PRN
  Filled 2020-12-20: qty 60, 20d supply, fill #0

## 2020-12-21 ENCOUNTER — Other Ambulatory Visit (HOSPITAL_COMMUNITY): Payer: Self-pay

## 2021-01-09 ENCOUNTER — Other Ambulatory Visit: Payer: Self-pay | Admitting: Internal Medicine

## 2021-01-09 ENCOUNTER — Other Ambulatory Visit (HOSPITAL_COMMUNITY): Payer: Self-pay

## 2021-01-09 MED ORDER — HYDROCODONE-ACETAMINOPHEN 5-325 MG PO TABS
1.0000 | ORAL_TABLET | Freq: Three times a day (TID) | ORAL | 0 refills | Status: DC | PRN
  Filled 2021-01-09: qty 60, 20d supply, fill #0

## 2021-01-09 NOTE — Telephone Encounter (Signed)
Ok to refill meds and I will send pain med until she is seen in pain management

## 2021-01-17 ENCOUNTER — Encounter (HOSPITAL_COMMUNITY): Payer: Self-pay | Admitting: *Deleted

## 2021-01-18 ENCOUNTER — Telehealth: Payer: Self-pay

## 2021-01-18 NOTE — Telephone Encounter (Signed)
Called patient and she is still in [place], plans to be back in [place] by Monday 01/21/21. She states she is under a lot of stress and her symptoms of abd. pain, GERD, fatique , nausea and dry mouth have been a lot worse the last few days. She is taking Protonix-BID, Tums-PRN,  Hydrocodone/APAP-(5/338m) Q8hrs. No fever. I suggested some stress relief measures. Please advise.

## 2021-01-18 NOTE — Telephone Encounter (Signed)
Called patient back and gave her Dr. Venita Sheffield recommendations

## 2021-01-18 NOTE — Telephone Encounter (Signed)
Agree, stress relief measures recommended. I expect she is having GI symptoms as a result of her family situation  Light exercises, deep breathing, and taking time to be outside. Nothing else pharmacologically recommended now

## 2021-01-20 ENCOUNTER — Emergency Department (HOSPITAL_BASED_OUTPATIENT_CLINIC_OR_DEPARTMENT_OTHER)
Admission: EM | Admit: 2021-01-20 | Discharge: 2021-01-20 | Disposition: A | Discharge status: Home / Self Care | Payer: Self-pay | Attending: Emergency Medicine | Admitting: Emergency Medicine

## 2021-01-20 ENCOUNTER — Other Ambulatory Visit: Payer: Self-pay

## 2021-01-20 ENCOUNTER — Encounter (HOSPITAL_BASED_OUTPATIENT_CLINIC_OR_DEPARTMENT_OTHER): Payer: Self-pay | Admitting: Emergency Medicine

## 2021-01-20 ENCOUNTER — Emergency Department (HOSPITAL_BASED_OUTPATIENT_CLINIC_OR_DEPARTMENT_OTHER): Payer: Medicaid Other

## 2021-01-20 DIAGNOSIS — R1010 Upper abdominal pain, unspecified: Secondary | ICD-10-CM

## 2021-01-20 DIAGNOSIS — J4530 Mild persistent asthma, uncomplicated: Secondary | ICD-10-CM | POA: Insufficient documentation

## 2021-01-20 DIAGNOSIS — K766 Portal hypertension: Secondary | ICD-10-CM | POA: Insufficient documentation

## 2021-01-20 DIAGNOSIS — R000 Tachycardia, unspecified: Secondary | ICD-10-CM | POA: Insufficient documentation

## 2021-01-20 DIAGNOSIS — E039 Hypothyroidism, unspecified: Secondary | ICD-10-CM | POA: Insufficient documentation

## 2021-01-20 DIAGNOSIS — Z7951 Long term (current) use of inhaled steroids: Secondary | ICD-10-CM | POA: Insufficient documentation

## 2021-01-20 LAB — COMPREHENSIVE METABOLIC PANEL
ALT: 38 U/L (ref 0–44)
AST: 31 U/L (ref 15–41)
Albumin: 4.1 g/dL (ref 3.5–5.0)
Alkaline Phosphatase: 93 U/L (ref 38–126)
Anion gap: 9 (ref 5–15)
BUN: 9 mg/dL (ref 6–20)
CO2: 23 mmol/L (ref 22–32)
Calcium: 8.7 mg/dL — ABNORMAL LOW (ref 8.9–10.3)
Chloride: 112 mmol/L — ABNORMAL HIGH (ref 98–111)
Creatinine, Ser: 0.82 mg/dL (ref 0.44–1.00)
GFR, Estimated: >60 mL/min (ref >60)
Glucose, Bld: 102 mg/dL — ABNORMAL HIGH (ref 70–99)
Potassium: 4.4 mmol/L (ref 3.5–5.1)
Sodium: 144 mmol/L (ref 135–145)
Total Bilirubin: 0.4 mg/dL (ref 0.3–1.2)
Total Protein: 6.7 g/dL (ref 6.5–8.1)

## 2021-01-20 LAB — URINALYSIS, ROUTINE W REFLEX MICROSCOPIC
Bilirubin Urine: NEGATIVE
Glucose, UA: NEGATIVE mg/dL
Ketones, ur: NEGATIVE mg/dL
Nitrite: NEGATIVE
Protein, ur: 30 mg/dL — AB
RBC / HPF: >50 RBC/hpf — ABNORMAL HIGH (ref 0–5)
Specific Gravity, Urine: 1.031 — ABNORMAL HIGH (ref 1.005–1.030)
pH: 6 (ref 5.0–8.0)

## 2021-01-20 LAB — PREGNANCY, URINE: Preg Test, Ur: NEGATIVE

## 2021-01-20 LAB — LIPASE, BLOOD: Lipase: 13 U/L (ref 11–51)

## 2021-01-20 LAB — CBC
HCT: 35.7 % — ABNORMAL LOW (ref 36.0–46.0)
HCT: 32.9 % — ABNORMAL LOW (ref 36.0–46.0)
Hemoglobin: 10.8 g/dL — ABNORMAL LOW (ref 12.0–15.0)
Hemoglobin: 10 g/dL — ABNORMAL LOW (ref 12.0–15.0)
MCH: 25.2 pg — ABNORMAL LOW (ref 26.0–34.0)
MCH: 25.4 pg — ABNORMAL LOW (ref 26.0–34.0)
MCHC: 30.3 g/dL (ref 30.0–36.0)
MCHC: 30.4 g/dL (ref 30.0–36.0)
MCV: 83.4 fL (ref 80.0–100.0)
MCV: 83.7 fL (ref 80.0–100.0)
Platelets: 99 10*3/uL — ABNORMAL LOW (ref 150–400)
Platelets: 77 10*3/uL — ABNORMAL LOW (ref 150–400)
RBC: 4.28 MIL/uL (ref 3.87–5.11)
RBC: 3.93 MIL/uL (ref 3.87–5.11)
RDW: 18 % — ABNORMAL HIGH (ref 11.5–15.5)
RDW: 17.8 % — ABNORMAL HIGH (ref 11.5–15.5)
WBC: 4 10*3/uL (ref 4.0–10.5)
WBC: 3.4 10*3/uL — ABNORMAL LOW (ref 4.0–10.5)
nRBC: 0 % (ref 0.0–0.2)
nRBC: 0 % (ref 0.0–0.2)

## 2021-01-20 MED ORDER — HYDROMORPHONE HCL 1 MG/ML IJ SOLN
1.0000 mg | Freq: Once | INTRAMUSCULAR | Status: AC
  Administered 2021-01-20: 1 mg via INTRAVENOUS
  Filled 2021-01-20: qty 1

## 2021-01-20 MED ORDER — SODIUM CHLORIDE 0.9 % IV SOLN
25.0000 mg | Freq: Once | INTRAVENOUS | Status: AC
  Administered 2021-01-20: 25 mg via INTRAVENOUS
  Filled 2021-01-20: qty 1

## 2021-01-20 MED ORDER — IOHEXOL 300 MG/ML  SOLN
100.0000 mL | Freq: Once | INTRAMUSCULAR | Status: AC | PRN
  Administered 2021-01-20: 100 mL via INTRAVENOUS

## 2021-01-20 MED ORDER — ONDANSETRON HCL 4 MG/2ML IJ SOLN
4.0000 mg | Freq: Once | INTRAMUSCULAR | Status: AC
  Administered 2021-01-20: 4 mg via INTRAVENOUS
  Filled 2021-01-20: qty 2

## 2021-01-20 MED ORDER — PROMETHAZINE HCL 25 MG/ML IJ SOLN
INTRAMUSCULAR | Status: AC
  Administered 2021-01-20: 25 mg
  Filled 2021-01-20: qty 1

## 2021-01-20 NOTE — ED Triage Notes (Signed)
Pt presents to ED POV. Pt c/o LUQ abd pain x1w. Pt also c/o that she has also had severe heartburn, nausea, no appetite, more menstrual bleeding than normal.

## 2021-01-20 NOTE — ED Provider Notes (Signed)
[facility] EMERGENCY DEPT Provider Note   CSN: [id number] Arrival date & time: 01/20/21  1547     History Chief Complaint  Patient presents with   Abdominal Pain    Linda Becker is a 30 y.o. female.  HPI 30 year old female presents with abdominal pain.  She has a complex abdominal history including cirrhosis with varices, splenomegaly, and previous gastric sleeve.  Since having variceal bleeding last year she has been having constant upper abdominal pain and nausea/vomiting.  However over the past week or so she is been having more lateral pain over her left side that is different.  Feels like someone is poking something dull into her side.  It is rated as 7 out of 10.  She is also having vomiting but no bloody emesis and no melena or bloody stools.  She did recently wake up with heavy vaginal bleeding over the last day or so.  Past Medical History:  Diagnosis Date   Allergic rhinitis    remote hx of allergy shots   Asthma    since childhood  on controller meds extrinsic dr Carmelina Peal   Bipolar II disorder (HCC)    Cirrhosis ([place])    Esophageal varices ([place])    Family history of adverse reaction to anesthesia    mom and sister difficult to awaken per patient    Gallbladder sludge    Gastropathy    GERD (gastroesophageal reflux disease)    on nexium  for  long term sx since childhood   H/O miscarriage, not currently pregnant    [redacted] weeks  march 2016   Hepatic steatosis    Hypothyroidism    Migraine    Murmur    pt reports MVP   Portal hypertension ([place])    Sessile colonic polyp    Splenomegaly    Steatohepatitis    Syncope    under eval ? cause   Tachycardia    episodes with near syncope eval dr Nadyne Coombes  on no meds dced LABA   Thrombocytopenia (HCC)    Upper GI bleed     Patient Active Problem List   Diagnosis Date Noted   Upper abdominal pain    Gastritis and gastroduodenitis    NASH (nonalcoholic steatohepatitis) 08/23/2020   Generalized  abdominal pain 08/23/2020   Chronic nausea 08/23/2020   Esophageal varices without bleeding (HCC)    Idiopathic esophageal varices with bleeding ([place])    Hematemesis 01/27/2020   Cirrhosis of liver ([place]) 01/27/2020   Acute GI bleeding 01/27/2020   Dehydration 07/22/2019   Portal hypertension (HCC)    Chronic diarrhea    Benign neoplasm of ascending colon    Benign neoplasm of transverse colon    Benign neoplasm of rectum    Thrombocytopenia ([place]) 08/12/2018   Spontaneous vaginal delivery 12/22/2017   IUFD at 56 weeks or more of gestation 12/20/2017   Snoring 11/10/2017   Gestational diabetes 10/12/2017   Morbid obesity ([place]) 05/19/2017   Adult hypothyroidism 05/19/2017   BMI 40.0-44.9, adult ([place]) 11/09/2015   GERD (gastroesophageal reflux disease)    Tachycardia    Syncope    Allergic rhinitis    Persistent asthma with undetermined severity    Migraine with aura 09/26/2011    Past Surgical History:  Procedure Laterality Date   BIOPSY  09/03/2018   Procedure: BIOPSY;  Surgeon: Jerene Bears, MD;  Location: [facility];  Service: Gastroenterology;;   BIOPSY  09/26/2020   Procedure: BIOPSY;  Surgeon: Jerene Bears, MD;  Location: [facility];  Service: Gastroenterology;;   broken right femur  2008   rod placed   COLONOSCOPY WITH PROPOFOL N/A 09/03/2018   Procedure: COLONOSCOPY WITH PROPOFOL;  Surgeon: Jerene Bears, MD;  Location: [facility];  Service: Gastroenterology;  Laterality: N/A;   DILATION AND CURETTAGE OF UTERUS     ESOPHAGOGASTRODUODENOSCOPY (EGD) WITH PROPOFOL N/A 09/03/2018   Procedure: ESOPHAGOGASTRODUODENOSCOPY (EGD) WITH PROPOFOL;  Surgeon: Jerene Bears, MD;  Location: [facility];  Service: Gastroenterology;  Laterality: N/A;   ESOPHAGOGASTRODUODENOSCOPY (EGD) WITH PROPOFOL N/A 01/27/2020   Procedure: ESOPHAGOGASTRODUODENOSCOPY (EGD) WITH PROPOFOL;  Surgeon: Rush Landmark Telford Nab., MD;  Location: [facility];  Service: Gastroenterology;  Laterality: N/A;    ESOPHAGOGASTRODUODENOSCOPY (EGD) WITH PROPOFOL N/A 03/12/2020   Procedure: ESOPHAGOGASTRODUODENOSCOPY (EGD) WITH PROPOFOL;  Surgeon: Jerene Bears, MD;  Location: [facility];  Service: Gastroenterology;  Laterality: N/A;   ESOPHAGOGASTRODUODENOSCOPY (EGD) WITH PROPOFOL N/A 09/26/2020   Procedure: ESOPHAGOGASTRODUODENOSCOPY (EGD) WITH PROPOFOL;  Surgeon: Jerene Bears, MD;  Location: [facility];  Service: Gastroenterology;  Laterality: N/A;   GASTRIC VARICES BANDING  01/27/2020   Procedure: GASTRIC VARICES BANDING;  Surgeon: Rush Landmark Telford Nab., MD;  Location: [facility];  Service: Gastroenterology;;   IR TRANSCATHETER BX  01/19/2019   IR US GUIDE VASC ACCESS RIGHT  01/19/2019   IR VENOGRAM HEPATIC W HEMODYNAMIC EVALUATION  01/19/2019   LAPAROSCOPIC GASTRIC SLEEVE RESECTION N/A 06/28/2019   Procedure: LAPAROSCOPIC GASTRIC SLEEVE RESECTION, Upper Endo, ERAS Pathway;  Surgeon: Clovis Riley, MD;  Location: [facility];  Service: General;  Laterality: N/A;   OB ultrasound N/A 12/01/2017   see report   POLYPECTOMY  09/03/2018   Procedure: POLYPECTOMY;  Surgeon: Jerene Bears, MD;  Location: [facility];  Service: Gastroenterology;;   TONSILLECTOMY  2006   WISDOM TOOTH EXTRACTION       OB History     Gravida  2   Para  1   Term      Preterm  1   AB  1   Living  0      SAB  1   IAB      Ectopic      Multiple  0   Live Births              Family History  Problem Relation Age of Onset   Asthma Father    Arthritis Father    Hyperlipidemia Father    Heart disease Father    Hypertension Father    Diabetes Father    Colon polyps Father    Arthritis Mother    Hyperlipidemia Mother    Heart disease Mother    Hypertension Mother    Diabetes Mother    Kidney disease Mother    Liver disease Mother        advanced non-alcoholic cirrhosis   Bleeding Disorder Mother    Colon polyps Mother    Arthritis Maternal Grandmother    Breast cancer Maternal Grandmother     Thyroid disease Maternal Grandmother    Colon cancer Paternal Grandfather    Diabetes Paternal Grandfather    Liver cancer Paternal Grandfather    Stomach cancer Paternal Grandfather    Breast cancer Paternal Grandmother    Diabetes Paternal Grandmother    Breast cancer Paternal Aunt    Esophageal cancer Paternal Aunt    Breast cancer Maternal Aunt    Diabetes Maternal Grandfather    Pancreatic cancer Maternal Grandfather     Social History   Tobacco  Use   Smoking status: Never   Smokeless tobacco: Never  Vaping Use   Vaping Use: Never used  Substance Use Topics   Alcohol use: No    Alcohol/week: 0.0 standard drinks   Drug use: No    Home Medications Prior to Admission medications   Medication Sig Start Date End Date Taking? Authorizing Provider  albuterol (PROVENTIL) (2.5 MG/3ML) 0.083% nebulizer solution Take 3 mLs (2.5 mg total) by nebulization every 4 (four) hours as needed for wheezing or shortness of breath. 01/29/20   Aline August, MD  albuterol (VENTOLIN HFA) 108 (90 Base) MCG/ACT inhaler INHALE 2 PUFFS BY MOUTH INTO THE LUNGS EVERY 4 TO 6 HOURS AS NEEDED FOR COUGH OR WHEEZE Patient taking differently: Inhale 2 puffs into the lungs every 4 (four) hours as needed for shortness of breath or wheezing. 07/19/20 07/19/21  Kennith Gain, MD  ARIPiprazole (ABILIFY) 10 MG tablet TAKE 1 TABLET BY MOUTH DAILY Patient taking differently: Take 10 mg by mouth daily. 05/28/20 05/28/21    ARIPiprazole (ABILIFY) 5 MG tablet Take 1 tablet by mouth daily 09/21/20     ARIPiprazole (ABILIFY) 5 MG tablet Take 1 tablet by mouth daily 10/03/20     Ascorbic Acid (VITAMIN C) 1000 MG tablet Take 2,000 mg by mouth 2 (two) times daily.    [provider]  Beclomethasone Dipropionate (QNASL) 80 MCG/ACT AERS INSTILL 1 SPRAY IN EACH NOSTRIL ONCE DAILY AS DIRECTED Patient taking differently: Place 1 spray into the nose 2 (two) times daily. 09/29/19   Kozlow, Donnamarie Poag, MD  CALCIUM PO Take  1 tablet by mouth 2 (two) times daily.    [provider]  Cetirizine HCl 10 MG CAPS Take 10 mg by mouth 2 (two) times daily.     [provider]  diphenhydrAMINE (BENADRYL) 25 MG tablet Take 50 mg by mouth at bedtime as needed for allergies or sleep.    [provider]  doxycycline (VIBRAMYCIN) 100 MG capsule Take 1 capsule (100 mg total) by mouth 2 (two) times daily. 11/22/20   Pyrtle, [name], MD  FLUoxetine (PROZAC) 20 MG capsule TAKE 2 CAPSULES BY MOUTH EVERY MORNING Patient taking differently: Take 20 mg by mouth 2 (two) times daily. 05/28/20 05/28/21    gabapentin (NEURONTIN) 300 MG capsule Take 1 capsule by mouth three times a day 10/03/20     HYDROcodone-acetaminophen (NORCO/VICODIN) 5-325 MG tablet Take 1 tablet by mouth every 8 (eight) hours as needed for severe pain. 01/09/21 02/08/21  Pyrtle, [name], MD  ipratropium-albuterol (DUONEB) 0.5-2.5 (3) MG/3ML SOLN INHALE 1 VIAL BY NEBULIZATION EVERY 6 HOURS AS NEEDED Patient not taking: No sig reported 07/19/20 07/19/21  Kennith Gain, MD  lamoTRIgine (LAMICTAL) 100 MG tablet 1 TAB BY MOUTH EVERY NIGHT AT BEDTIME (TOTAL DOSE 300MG BEGIN IN 2 WEEKS)IF OUT FOR MORE THAN 1 WEEK DO NOT RESTART OR IF RASH OCCURS CALL OFFICE Patient taking differently: Take 100 mg by mouth 2 (two) times daily. 04/12/20 04/12/21    lamoTRIgine (LAMICTAL) 25 MG tablet Take 1 tablet by mouth once a day for 2 weeks, then take 2 tablets by mouth daily for 2 weeks. 10/03/20     montelukast (SINGULAIR) 10 MG tablet TAKE 1 TABLET BY MOUTH ONCE DAILY Patient taking differently: Take 10 mg by mouth daily. 08/07/20 08/07/21  Kozlow, Donnamarie Poag, MD  nadolol (CORGARD) 20 MG tablet Take 1 tablet (20 mg total) by mouth daily. 10/01/20 10/01/21  Pyrtle, [name], MD  Olopatadine  HCl 0.2 % SOLN Can use one drop in each eye once daily if needed. Patient taking differently: Place 1 drop into both eyes daily as needed (allergies). 09/29/19   Kozlow, Donnamarie Poag, MD   ondansetron (ZOFRAN-ODT) 8 MG disintegrating tablet DISSOLVE 1 TABLET BY MOUTH EVERY 8 HOURS AS NEEDED FOR NAUSEA OR VOMITING Patient taking differently: Take 8 mg by mouth every 8 (eight) hours as needed for nausea or vomiting. 08/23/20 08/23/21  Zehr, Laban Emperor, PA-C  pantoprazole (PROTONIX) 40 MG tablet Take 1 tablet (40 mg total) by mouth 2 (two) times daily before a meal. 11/22/20 11/22/21  Pyrtle, [name], MD  Prenatal Vit-Fe Fumarate-FA (MULTIVITAMIN-PRENATAL) 27-0.8 MG TABS tablet Take 1 tablet by mouth 2 (two) times daily.    [provider]  promethazine (PHENERGAN) 25 MG tablet Take 1 tablet (25 mg total) by mouth every 6 (six) hours as needed for vomiting or nausea. 11/22/20   Pyrtle, [name], MD  rifaximin (XIFAXAN) 550 MG TABS tablet Take 1 tablet (550 mg total) by mouth 2 (two) times daily. 10/01/20 10/01/21  Pyrtle, [name], MD  SYMBICORT 160-4.5 MCG/ACT inhaler INHALE 2 PUFFS BY MOUTH TWICE A DAY TO PREVENT COUGH OR WHEEZE. RINSE, GARGLE, AND SPIT AFTER USE. Patient taking differently: Inhale 2 puffs into the lungs 2 (two) times daily. RINSE, GARGLE, AND SPIT AFTER USE. 09/29/19   Kozlow, Donnamarie Poag, MD  VITAMIN D PO Take 1 capsule by mouth 2 (two) times daily.    [provider]  VITAMIN E PO Take 1 capsule by mouth daily.    [provider]  zinc gluconate 50 MG tablet Take 50 mg by mouth daily.    [provider]    Allergies    Progesterone and Penicillins  Review of Systems   Review of Systems  Gastrointestinal:  Positive for abdominal pain, nausea and vomiting. Negative for blood in stool, constipation and diarrhea.  Musculoskeletal:  Negative for back pain.  All other systems reviewed and are negative.  Physical Exam Updated Vital Signs BP 124/69 (BP Location: Right Arm)   Pulse (!) 112   Temp 98.9 F (37.2 C) (Oral)   Resp 18   Ht 5' 5"  (1.651 m)   Wt 122.5 kg   LMP 01/19/2021   SpO2 100%   BMI 44.93 kg/m   Physical Exam Vitals and  nursing note reviewed.  Constitutional:      General: She is not in acute distress.    Appearance: She is well-developed. She is obese. She is not ill-appearing or diaphoretic.  HENT:     Head: Normocephalic and atraumatic.     Right Ear: External ear normal.     Left Ear: External ear normal.     Nose: Nose normal.  Eyes:     General:        Right eye: No discharge.        Left eye: No discharge.  Cardiovascular:     Rate and Rhythm: Normal rate and regular rhythm.     Heart sounds: Normal heart sounds.  Pulmonary:     Effort: Pulmonary effort is normal.     Breath sounds: Normal breath sounds.  Abdominal:     Palpations: Abdomen is soft. There is splenomegaly.     Tenderness: There is abdominal tenderness (worst in LUQ) in the right upper quadrant, epigastric area and left upper quadrant.  Skin:    General: Skin is warm and dry.  Neurological:  Mental Status: She is alert.  Psychiatric:        Mood and Affect: Mood is not anxious.    ED Results / Procedures / Treatments   Labs (all labs ordered are listed, but only abnormal results are displayed) Labs Reviewed  COMPREHENSIVE METABOLIC PANEL - Abnormal; Notable for the following components:      Result Value   Chloride 112 (*)    Glucose, Bld 102 (*)    Calcium 8.7 (*)    All other components within normal limits  CBC - Abnormal; Notable for the following components:   Hemoglobin 10.8 (*)    HCT 35.7 (*)    MCH 25.2 (*)    RDW 18.0 (*)    Platelets 99 (*)    All other components within normal limits  URINALYSIS, ROUTINE W REFLEX MICROSCOPIC - Abnormal; Notable for the following components:   APPearance HAZY (*)    Specific Gravity, Urine 1.031 (*)    Hgb urine dipstick LARGE (*)    Protein, ur 30 (*)    Leukocytes,Ua TRACE (*)    RBC / HPF >50 (*)    All other components within normal limits  CBC - Abnormal; Notable for the following components:   WBC 3.4 (*)    Hemoglobin 10.0 (*)    HCT 32.9 (*)    MCH  25.4 (*)    RDW 17.8 (*)    Platelets 77 (*)    All other components within normal limits  LIPASE, BLOOD  PREGNANCY, URINE    EKG None  Radiology CT ABDOMEN PELVIS W CONTRAST  Result Date: 01/20/2021 CLINICAL DATA:  Nausea, vomiting, left upper quadrant pain EXAM: CT ABDOMEN AND PELVIS WITH CONTRAST TECHNIQUE: Multidetector CT imaging of the abdomen and pelvis was performed using the standard protocol following bolus administration of intravenous contrast. CONTRAST:  147m OMNIPAQUE IOHEXOL 300 MG/ML  SOLN COMPARISON:  01/27/2020 FINDINGS: Lower chest: Lung bases are clear. No effusions. Heart is normal size. Hepatobiliary: Changes of cirrhosis. Recanalization of the umbilical vein. No focal hepatic abnormality. Gallbladder unremarkable. Pancreas: No focal abnormality or ductal dilatation. Spleen: Splenomegaly with a craniocaudal length measuring 17.4 cm. Adrenals/Urinary Tract: No adrenal abnormality. No focal renal abnormality. No stones or hydronephrosis. Urinary bladder is unremarkable. Stomach/Bowel: Stomach, large and small bowel grossly unremarkable. Postoperative changes in the stomach, likely from gastric sleeve. Vascular/Lymphatic: No evidence of aneurysm or adenopathy. Reproductive: Uterus and adnexa unremarkable.  No mass. Other: No free fluid or free air. Musculoskeletal: No acute bony abnormality. IMPRESSION: Changes of cirrhosis with evidence of portal venous hypertension and splenomegaly. No acute findings in the abdomen or pelvis. Electronically Signed   By: KRolm BaptiseM.D.   On: 01/20/2021 19:25    Procedures Procedures   Medications Ordered in ED Medications  HYDROmorphone (DILAUDID) injection 1 mg (1 mg Intravenous Given 01/20/21 1822)  ondansetron (ZOFRAN) injection 4 mg (4 mg Intravenous Given 01/20/21 1822)  iohexol (OMNIPAQUE) 300 MG/ML solution 100 mL (100 mLs Intravenous Contrast Given 01/20/21 1850)  HYDROmorphone (DILAUDID) injection 1 mg (1 mg Intravenous Given 01/20/21  2037)  promethazine (PHENERGAN) 25 mg in sodium chloride 0.9 % 50 mL IVPB (25 mg Intravenous New Bag/Given 01/20/21 2038)  promethazine (PHENERGAN) 25 MG/ML injection (25 mg  Given 01/20/21 2038)    ED Course  I have reviewed the triage vital signs and the nursing notes.  Pertinent labs & imaging results that were available during my care of the patient were reviewed by me and considered  in my medical decision making (see chart for details).    MDM Rules/Calculators/A&P                           Unclear exact cause of her pain.  This is different than the chronic pain she is been having for months.  CT shows some chronic findings but no acute findings to explain her new pain.  She is vomiting (though not here) but no blood.  No bloody stools.  She does have a little bit of a lower hemoglobin than typical but she is also having heavy menstrual bleeding.  I discussed obs versus close outpatient follow-up and she prefers to go home.  She was a little tachycardic just prior to discharge though when I am talking to her heart rate is in the low 100s rather than 110s.  I doubt she is having acute GI bleeding.  We discussed return precautions but she otherwise prefers to go home and follow-up as an outpatient which I think is reasonable. Final Clinical Impression(s) / ED Diagnoses Final diagnoses:  Upper abdominal pain    Rx / DC Orders ED Discharge Orders     None        Sherwood Gambler, MD 01/20/21 2202

## 2021-01-20 NOTE — ED Notes (Signed)
RT removed pts IV at this time in preparation for D/C, catheter intact upon removal.

## 2021-01-20 NOTE — Discharge Instructions (Addendum)
Your hemoglobin is a little lower than typical.  While this could be from your vaginal bleeding currently, you need to follow-up closely with gastroenterology and/or your primary care doctor to get a CBC rechecked in a couple days.    If at any point you develop new or worsening abdominal pain, vomiting or especially vomiting blood or black emesis, bloody stools or black stools, dizziness, or any other new/concerning symptoms or return to the ER for evaluation.

## 2021-01-21 ENCOUNTER — Other Ambulatory Visit: Payer: Self-pay | Admitting: Internal Medicine

## 2021-01-22 ENCOUNTER — Other Ambulatory Visit: Payer: Self-pay

## 2021-01-22 DIAGNOSIS — K7581 Nonalcoholic steatohepatitis (NASH): Secondary | ICD-10-CM

## 2021-01-22 NOTE — Telephone Encounter (Signed)
It appears that the patient may have had a UTI, was she treated? If she is having any urinary symptoms she should have a repeat urinalysis and see her primary care Lets repeat CBC along with ferritin plus IBC panel, CMP and INR in 1 week  Office follow-up with me or APP

## 2021-01-23 ENCOUNTER — Other Ambulatory Visit (HOSPITAL_COMMUNITY): Payer: Self-pay

## 2021-01-23 NOTE — Telephone Encounter (Signed)
She currently only has medicaid family planning which is essentially useless for anything other than OB/GYN care to my understanding. There is a note in "media" from [place] on 12/20/20 indicating that she has a pending application for medicaid. I cannot discern from this letter if this application is for medicaid family planning which she has already been granted at this point or if they are changing her to a full medicaid plan. Either way, she does not currently have coverage for [place] or a pain management physician.

## 2021-01-24 ENCOUNTER — Other Ambulatory Visit: Payer: Self-pay | Admitting: Internal Medicine

## 2021-01-24 ENCOUNTER — Other Ambulatory Visit (HOSPITAL_COMMUNITY): Payer: Self-pay

## 2021-01-24 MED ORDER — HYDROCODONE-ACETAMINOPHEN 5-325 MG PO TABS
1.0000 | ORAL_TABLET | Freq: Three times a day (TID) | ORAL | 0 refills | Status: DC | PRN
  Filled 2021-01-24 – 2021-01-28 (×2): qty 60, 20d supply, fill #0

## 2021-01-24 MED ORDER — PROMETHAZINE HCL 25 MG PO TABS
25.0000 mg | ORAL_TABLET | Freq: Four times a day (QID) | ORAL | 1 refills | Status: DC | PRN
  Filled 2021-01-24 – 2021-02-14 (×2): qty 120, 30d supply, fill #0
  Filled 2021-03-07: qty 120, 30d supply, fill #1

## 2021-01-24 NOTE — Telephone Encounter (Signed)
Prescription sent to the pharmacy for refill to be available on 01/28/2021 or after I have been more than patient as the patient is awaiting chronic pain management clinic appointment.  She states she needs Medicaid approval before they will see her. She will be seen in the office on 02/05/2021 We can discuss pain at this time but I cannot continue to write long-term pain medications for chronic pain not directly related to her liver cirrhosis. JMP

## 2021-01-25 ENCOUNTER — Other Ambulatory Visit (INDEPENDENT_AMBULATORY_CARE_PROVIDER_SITE_OTHER): Payer: Self-pay

## 2021-01-25 ENCOUNTER — Other Ambulatory Visit (HOSPITAL_COMMUNITY): Payer: Self-pay

## 2021-01-25 DIAGNOSIS — K7581 Nonalcoholic steatohepatitis (NASH): Secondary | ICD-10-CM

## 2021-01-25 LAB — IBC PANEL
Iron: 23 ug/dL — ABNORMAL LOW (ref 42–145)
Saturation Ratios: 4.9 % — ABNORMAL LOW (ref 20.0–50.0)
TIBC: 469 ug/dL — ABNORMAL HIGH (ref 250.0–450.0)
Transferrin: 335 mg/dL (ref 212.0–360.0)

## 2021-01-25 LAB — CBC WITH DIFFERENTIAL/PLATELET
Basophils Absolute: 0 10*3/uL (ref 0.0–0.1)
Basophils Relative: 0.5 % (ref 0.0–3.0)
Eosinophils Absolute: 0.1 10*3/uL (ref 0.0–0.7)
Eosinophils Relative: 2.9 % (ref 0.0–5.0)
HCT: 33.9 % — ABNORMAL LOW (ref 36.0–46.0)
Hemoglobin: 10.8 g/dL — ABNORMAL LOW (ref 12.0–15.0)
Lymphocytes Relative: 28.2 % (ref 12.0–46.0)
Lymphs Abs: 1 10*3/uL (ref 0.7–4.0)
MCHC: 31.9 g/dL (ref 30.0–36.0)
MCV: 80.4 fl (ref 78.0–100.0)
Monocytes Absolute: 0.2 10*3/uL (ref 0.1–1.0)
Monocytes Relative: 5.8 % (ref 3.0–12.0)
Neutro Abs: 2.3 10*3/uL (ref 1.4–7.7)
Neutrophils Relative %: 62.6 % (ref 43.0–77.0)
Platelets: 96 10*3/uL — ABNORMAL LOW (ref 150.0–400.0)
RBC: 4.22 Mil/uL (ref 3.87–5.11)
RDW: 19.7 % — ABNORMAL HIGH (ref 11.5–15.5)
WBC: 3.7 10*3/uL — ABNORMAL LOW (ref 4.0–10.5)

## 2021-01-25 LAB — COMPREHENSIVE METABOLIC PANEL
ALT: 22 U/L (ref 0–35)
AST: 28 U/L (ref 0–37)
Albumin: 3.7 g/dL (ref 3.5–5.2)
Alkaline Phosphatase: 76 U/L (ref 39–117)
BUN: 7 mg/dL (ref 6–23)
CO2: 22 mEq/L (ref 19–32)
Calcium: 8.6 mg/dL (ref 8.4–10.5)
Chloride: 112 mEq/L (ref 96–112)
Creatinine, Ser: 0.79 mg/dL (ref 0.40–1.20)
GFR: 101.95 mL/min (ref >60.00)
Glucose, Bld: 87 mg/dL (ref 70–99)
Potassium: 4.6 mEq/L (ref 3.5–5.1)
Sodium: 141 mEq/L (ref 135–145)
Total Bilirubin: 0.3 mg/dL (ref 0.2–1.2)
Total Protein: 6.5 g/dL (ref 6.0–8.3)

## 2021-01-25 LAB — PROTIME-INR
INR: 1.1 ratio — ABNORMAL HIGH (ref 0.8–1.0)
Prothrombin Time: 12.5 s (ref 9.6–13.1)

## 2021-01-25 LAB — FERRITIN: Ferritin: 9.3 ng/mL — ABNORMAL LOW (ref 10.0–291.0)

## 2021-01-28 ENCOUNTER — Other Ambulatory Visit (HOSPITAL_COMMUNITY): Payer: Self-pay

## 2021-01-28 ENCOUNTER — Other Ambulatory Visit: Payer: Self-pay

## 2021-01-28 ENCOUNTER — Telehealth: Payer: Self-pay

## 2021-01-28 DIAGNOSIS — D509 Iron deficiency anemia, unspecified: Secondary | ICD-10-CM

## 2021-01-28 DIAGNOSIS — K7460 Unspecified cirrhosis of liver: Secondary | ICD-10-CM

## 2021-01-28 MED ORDER — FERROUS SULFATE 325 (65 FE) MG PO TABS
325.0000 mg | ORAL_TABLET | Freq: Every day | ORAL | 3 refills | Status: DC
  Filled 2021-01-28: qty 30, 30d supply, fill #0

## 2021-01-28 NOTE — Telephone Encounter (Signed)
Left message to please call back

## 2021-01-28 NOTE — Telephone Encounter (Signed)
-----   Message from Jerene Bears, MD sent at 01/28/2021  4:03 PM EDT ----- Blood counts, INR, and liver and kidney function all look normal which is very good for her. She has a history of cirrhosis. She does have mild iron deficiency but without anemia I would recommend she begin ferrous sulfate 325 mg once daily; best taken with food; will likely darken her stool  Repeat iron studies (IBC panel plus ferritin), INR, CBC and CMP in 3 months

## 2021-02-05 ENCOUNTER — Ambulatory Visit: Payer: Medicaid Other | Admitting: Nurse Practitioner

## 2021-02-14 ENCOUNTER — Other Ambulatory Visit (HOSPITAL_COMMUNITY): Payer: Self-pay

## 2021-02-19 ENCOUNTER — Other Ambulatory Visit (HOSPITAL_COMMUNITY): Payer: Self-pay

## 2021-02-19 ENCOUNTER — Other Ambulatory Visit: Payer: Self-pay | Admitting: Internal Medicine

## 2021-02-20 ENCOUNTER — Other Ambulatory Visit: Payer: Self-pay | Admitting: Internal Medicine

## 2021-02-20 ENCOUNTER — Other Ambulatory Visit (HOSPITAL_COMMUNITY): Payer: Self-pay

## 2021-02-20 ENCOUNTER — Telehealth: Payer: Self-pay | Admitting: Internal Medicine

## 2021-02-21 ENCOUNTER — Other Ambulatory Visit: Payer: Self-pay | Admitting: Internal Medicine

## 2021-02-21 ENCOUNTER — Other Ambulatory Visit (HOSPITAL_COMMUNITY): Payer: Self-pay

## 2021-02-21 DIAGNOSIS — R1084 Generalized abdominal pain: Secondary | ICD-10-CM

## 2021-02-21 MED FILL — Hydrocodone-Acetaminophen Tab 5-325 MG: ORAL | 30 days supply | Qty: 60 | Fill #0 | Status: AC

## 2021-02-21 NOTE — Telephone Encounter (Signed)
Called and spoke with patient, she is aware that Dr. [name] is lowering her dose of Hydrocodone and that this will be her last refill from him. She expressed understanding that further refills need to come from pain management or her primary care.   She had spoke with DSS yesterday and she should find out about her update Medicaid in the next few days. She will also be coming by the office to sign paperwork in order for the office to fill ou some paperwork for disability.

## 2021-02-21 NOTE — Telephone Encounter (Signed)
I did the paperwork earlier for her [place] See my recent note regarding 1 additional hydrocodone refill Future refills need to be from pain management or primary care

## 2021-02-21 NOTE — Telephone Encounter (Signed)
Patient has been waiting for Medicaid approval so that she can be seen for chronic pain and pain management clinic I have been refilling hydrocodone for chronic pain in the interim There has not been a definitive GI cause for this chronic pain and therefore I am going to wean pain medication prescribed by me and then no longer prescribe narcotic pain medication I do think she needs to establish with pain management I see that she has follow-up with me in December  I am refilling hydrocodone at this point previously 5-325 mg 1 tablet every 8 hours #60.  I am changing this to 5-325 mg 1 tablet every 12 hours #60 which should last at least 30 days; thereafter I will discontinue narcotics from my clinic unless definitive GI cause for narcotic pain medication is discovered/found  JMP

## 2021-02-21 NOTE — Telephone Encounter (Signed)
See my note regarding her need to be seen by chronic pain management I am going to wean narcotics prescribed by me and defer future narcotic prescriptions to pain management She has been waiting on Medicaid approval and I have been filling narcotics for chronic pain in the meantime  I am weaning hydrocodone down to 5-325 mg 1 tablet every 12 hours, #60 this should last at least 30 days Future refills need to be from pain management for primary care

## 2021-02-22 ENCOUNTER — Other Ambulatory Visit (HOSPITAL_COMMUNITY): Payer: Self-pay

## 2021-02-22 ENCOUNTER — Telehealth: Payer: Self-pay | Admitting: Internal Medicine

## 2021-02-22 ENCOUNTER — Telehealth: Payer: Medicaid Other | Admitting: Nurse Practitioner

## 2021-02-22 DIAGNOSIS — N898 Other specified noninflammatory disorders of vagina: Secondary | ICD-10-CM

## 2021-02-22 NOTE — Telephone Encounter (Signed)
06/25/20. I spoke to patient and informed her that her FMLA paperwork was ready. She stated that she would pick up paperwork. Patient never picked up paperwork.   I spoke to patient on 02/11/21 and informed her that she would need to come by the office to fill out a release form in order to fax paperwork to the [place] life.

## 2021-02-22 NOTE — Progress Notes (Signed)
Based on what you shared with me it looks like you have vaginal discharge with itching and foul smell. ,that should be evaluated in a face to face office visit. Based on your answers I can not make a definitive diagnosis. Yeast infection causes itching but doe snot have a faoul smell. Bacterial vaginosis has a foul smell with no itching. They are both treated very differently. Also I cannot treat a uti with this questionaire. You will need to do the UTI evisit for UTI treatment.  NOTE: There will be NO CHARGE for this eVisit   If you are having a true medical emergency please call 911.      For an urgent face to face visit, [place] has six urgent care centers for your convenience:     [facility] [place] [facility] [place] Get Driving Directions [phone] [address], [place] [address]    [facility]) Get Driving Directions [phone] [place], [place] [address]  [facility] [place] [facility]) Get Driving Directions [phone] [address] [place],  [place]  [address]  [facility] Get Driving Directions [phone] [address], [place], [place] [address]   [facility] Get Driving Directions  [phone] [address] [place], [place] [address]   [facility] [place] Get Driving Directions [phone] [address], [place], [place] [address]  Your MyChart E-visit questionnaire answers were reviewed by a board certified advanced clinical practitioner to complete your personal care plan based on your specific symptoms.  Thank you for using e-Visits.

## 2021-02-25 ENCOUNTER — Encounter: Payer: Self-pay | Admitting: *Deleted

## 2021-02-27 NOTE — Telephone Encounter (Signed)
Okay for referral to another pain management clinic that will accept her new insurance

## 2021-03-04 ENCOUNTER — Other Ambulatory Visit (HOSPITAL_COMMUNITY): Payer: Self-pay

## 2021-03-05 ENCOUNTER — Telehealth: Payer: Self-pay | Admitting: Internal Medicine

## 2021-03-05 NOTE — Telephone Encounter (Signed)
See pt advice request.

## 2021-03-07 ENCOUNTER — Other Ambulatory Visit (HOSPITAL_COMMUNITY): Payer: Self-pay

## 2021-03-07 MED FILL — Ondansetron Orally Disintegrating Tab 8 MG: ORAL | 20 days supply | Qty: 60 | Fill #0 | Status: AC

## 2021-03-07 MED FILL — Albuterol Sulfate Inhal Aero 108 MCG/ACT (90MCG Base Equiv): RESPIRATORY_TRACT | 16 days supply | Qty: 8.5 | Fill #0 | Status: AC

## 2021-03-13 ENCOUNTER — Ambulatory Visit (INDEPENDENT_AMBULATORY_CARE_PROVIDER_SITE_OTHER): Payer: Self-pay | Admitting: Nurse Practitioner

## 2021-03-13 ENCOUNTER — Encounter: Payer: Self-pay | Admitting: Nurse Practitioner

## 2021-03-13 VITALS — BP 110/70 | HR 134 | Ht 66.25 in | Wt 294.1 lb

## 2021-03-13 DIAGNOSIS — K7460 Unspecified cirrhosis of liver: Secondary | ICD-10-CM

## 2021-03-13 DIAGNOSIS — K7581 Nonalcoholic steatohepatitis (NASH): Secondary | ICD-10-CM

## 2021-03-13 NOTE — Progress Notes (Signed)
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 She is followed by Dr. [name].   She traveled to [place] the end of July 2022 and return to [place] on 01/20/2021.  She adhered to a low-sodium diet during her time of travel.  No alcohol use.  She developed increased heartburn, nausea and LUQ pain during her time of travel.  She continued taking Pantoprazole 40 mg twice daily and Tums 3-4 tabs daily.  Upon her return to [facility], she presented to [facility] with complaints of nausea, vomiting and worsening upper abdominal pain.  Labs in the ED showed a WBC count 4.0.  Hemoglobin 10.8.  Hematocrit 35.7.  Platelet 99.  Sodium 144.  Potassium 4.4.  BUN 9.  Creatinine 0.82.  Alk phos 93.  Albumin 4.1.  Lipase 13.  AST 31.  ALT 38.  Total bili 0.4.  CTAP with contrast was consistent with cirrhosis with evidence of portal venous hypertension and splenomegaly without acute findings in the abdomen or pelvis to explain her abdominal pain.  There was no evidence of any GI bleeding.  She received IV fluids, Phenergan, Zofran and Dilaudid and her symptoms improved.  She was hemodynamically stable and she was discharged home.  Repeat laboratory studies 01/25/2021: CMP was normal.  Iron 23.  TIBC 469.  Ferritin 9.3.  WBC 3.7.  Hemoglobin 10.8.  Platelet 96.  INR 1.1  She presents to our office today for further evaluation.  She continues to have uncontrollable heartburn and her abdominal pain is primarily localized to her LUQ area but she has intermittent lower abdominal pain as well.  She has chronic upper and lower abdominal pain for which she takes hydrocodone 5/325 mg 1 tab  every 12 hours most days.  She has nausea and vomited a few times over the past week, no hematemesis.  She complains of progressive shortness of breath over the past 2 weeks with abdominal bloat.  No cough or hemoptysis.  She has difficulty breathing when laying flat.  She has increased shortness of breath with any exertion.  He reported feeling absolutely awful today.  She has gained 14 pounds over the past few months.  Her weight today is 294 pounds.  No significant lower extremity swelling.  No confusion.  No bloody bowel movements or melena.  She recently took Bactrim (self prescribed, used a prior supply) for few days for a suspected UTI.  She complains of significant fatigue.  She has insomnia and is having difficulty sleeping.  Her stress level is quite elevated as well.  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  History of bleeding esophageal varices 8/13/201. Her most recent surveillance EGD was 09/26/2020 which showed grade 1 and small less than 5 mm esophageal varices which were not amenable to banding.  A 1 to 2 cm hiatal hernia was noted.  Evidence of a past gastrostomy was identified.  Erythematous/nodular mucosa was noted in the antrum and duodenum, pathology report was negative for H. pylori or dysplasia.  Repeat surveillance EGD in 1 year was recommended.   CTAP 01/20/2021: Lower chest: Lung bases are clear. No effusions. Heart is normal  size.  Hepatobiliary: Changes of cirrhosis. Recanalization of the umbilical vein. No focal hepatic abnormality. Gallbladder unremarkable.  Pancreas: No focal abnormality or ductal dilatation.  Spleen: Splenomegaly with a craniocaudal length measuring 17.4 cm.  Adrenals/Urinary Tract: No adrenal abnormality. No focal renal abnormality. No stones or hydronephrosis. Urinary bladder is unremarkable.  Stomach/Bowel: Stomach, large and small bowel grossly unremarkable. Postoperative changes in the stomach,  likely from gastric sleeve.  Vascular/Lymphatic: No evidence of aneurysm or adenopathy.  Reproductive: Uterus and adnexa unremarkable.  No mass.  Other: No free fluid or free air.  Musculoskeletal: No acute bony abnormality.  IMPRESSION: Changes of cirrhosis with evidence of portal venous hypertension and splenomegaly.  No acute findings in the abdomen or pelvis.   Abdominal sonogram 06/22/2020: Cirrhotic liver morphology.  No focal hepatic lesion.  Splenomegaly.  No ascites  EGD 09/26/2020: - Grade I and small (< 5 mm) esophageal varices which flattened completely with insufflation. Not amenable to EVL today. Evidence of scarring in the distal esophagus - Grade I and small (< 5 mm) esophageal varices which flIantpteantieedn tcompletely with insufflation. Not amenable to EVL today. Evidence of scarring in the distal esophagus from prior esophageal band ligation.. - 1-2 cm hiatal hernia. - A sleeve gastrectomy was found. - Striped erythematous and nodular mucosa in the antrum. Biopsied. - Nodular mucosa in the second portion of the duodenum. Biopsied. - Repeat upper endoscopy in 1 year for surveillance. A. DUODENUM, NODULE, BIOPSY:  - Ulcer with associated inflammation and reactive changes.  - No dysplasia or carcinoma.  B. STOMACH, NODULE, BIOPSY:  - Gastric mucosa with erosion and mild active inflammation.  - Warthin-Starry negative for Helicobacter pylori.  - No intestinal metaplasia, dysplasia or carcinoma.   EGD 01/27/2020: - No gross lesions in esophagus proximally. Grade II and grade III esophageal varices with stigmata of recent bleeding in distal esophagus. Completely eradicated. Banded. - Z-line irregular, 37 cm from the incisors. - Portal hypertensive gastropathy. Nodular mucosa in the gastric antrum. Previously biopsied and negative for HP. - No gross lesions in the duodenal bulb, in the first portion of the duodenum and in the second portion of the  duodenum.  Colonoscopy 09/03/2018: - Two 2 to 3 mm polyps in the ascending colon, removed with a cold biopsy forceps. Resected and retrieved. - Two 3 to 4 mm polyps in the transverse colon, removed with a cold snare. Complete resection. Partial retrieval. - One 5 mm polyp in the rectum, removed with a cold snare. Resected and retrieved. - The examination was otherwise normal on direct and retroflexion views. - Biopsies were taken with a cold forceps from the right colon and left colon for evaluation of microscopic colitis. Colon, polyp(s), ascending x 2 - SESSILE SERRATED POLYP (X2 FRAGMENTS). - NO DYSPLASIA OR MALIGNANCY. Colon, biopsy, random - BENIGN COLONIC MUCOSA. - NO ACTIVE INFLAMMATION OR EVIDENCE OF MICROSCOPIC COLITIS. - NO DYSPLASIA OR MALIGNANCY. Colon, polyp(s), transverse and rectal - HYPERPLASTIC POLYP. - BENIGN COLONIC MUCOSA WITH LYMPHOID AGGREGATE. - NO DYSPLASIA OR MALIGNANCY  Current Outpatient Medications on File Prior to Visit  Medication Sig Dispense Refill   albuterol (PROVENTIL) (2.5 MG/3ML) 0.083% nebulizer solution Take 3 mLs (2.5 mg total) by nebulization every 4 (four) hours as needed for wheezing or shortness of breath.     albuterol (VENTOLIN HFA) 108 (90 Base) MCG/ACT inhaler INHALE 2 PUFFS BY MOUTH INTO THE LUNGS EVERY 4 TO 6 HOURS AS NEEDED FOR COUGH OR WHEEZE (Patient taking differently: Inhale 2 puffs into  the lungs every 4 (four) hours as needed for shortness of breath or wheezing.) 8.5 g 1   ARIPiprazole (ABILIFY) 10 MG tablet TAKE 1 TABLET BY MOUTH DAILY (Patient taking differently: Take 10 mg by mouth daily.) 30 tablet 1   Ascorbic Acid (VITAMIN C) 1000 MG tablet Take 2,000 mg by mouth 2 (two) times daily.     Beclomethasone Dipropionate (QNASL) 80 MCG/ACT AERS INSTILL 1 SPRAY IN EACH NOSTRIL ONCE DAILY AS DIRECTED (Patient taking differently: Place 1 spray into the nose 2 (two) times daily.) 31.8 g 1   CALCIUM PO Take 1 tablet by mouth 2 (two)  times daily.     Cetirizine HCl 10 MG CAPS Take 10 mg by mouth 2 (two) times daily.      diphenhydrAMINE (BENADRYL) 25 MG tablet Take 50 mg by mouth at bedtime as needed for allergies or sleep.     doxycycline (VIBRAMYCIN) 100 MG capsule Take 1 capsule (100 mg total) by mouth 2 (two) times daily. 14 capsule 0   ferrous sulfate 325 (65 FE) MG tablet Take 1 tablet (325 mg total) by mouth daily with breakfast. 30 tablet 3   HYDROcodone-acetaminophen (NORCO/VICODIN) 5-325 MG tablet Take 1 tablet by mouth every 12 (twelve) hours as needed for moderate pain or severe pain. 60 tablet 0   ipratropium-albuterol (DUONEB) 0.5-2.5 (3) MG/3ML SOLN INHALE 1 VIAL BY NEBULIZATION EVERY 6 HOURS AS NEEDED 360 mL 2   lamoTRIgine (LAMICTAL) 100 MG tablet 1 TAB BY MOUTH EVERY NIGHT AT BEDTIME (TOTAL DOSE 300MG BEGIN IN 2 WEEKS)IF OUT FOR MORE THAN 1 WEEK DO NOT RESTART OR IF RASH OCCURS CALL OFFICE (Patient taking differently: Take 100 mg by mouth 2 (two) times daily.) 30 tablet 0   lamoTRIgine (LAMICTAL) 25 MG tablet Take 1 tablet by mouth once a day for 2 weeks, then take 2 tablets by mouth daily for 2 weeks. 42 tablet 0   montelukast (SINGULAIR) 10 MG tablet TAKE 1 TABLET BY MOUTH ONCE DAILY (Patient taking differently: Take 10 mg by mouth daily.) 90 tablet 1   nadolol (CORGARD) 20 MG tablet Take 1 tablet (20 mg total) by mouth daily. 30 tablet 3   Olopatadine HCl 0.2 % SOLN Can use one drop in each eye once daily if needed. (Patient taking differently: Place 1 drop into both eyes daily as needed (allergies).) 7.5 mL 1   ondansetron (ZOFRAN-ODT) 8 MG disintegrating tablet DISSOLVE 1 TABLET BY MOUTH EVERY 8 HOURS AS NEEDED FOR NAUSEA OR VOMITING (Patient taking differently: Take 8 mg by mouth every 8 (eight) hours as needed for nausea or vomiting.) 60 tablet 3   pantoprazole (PROTONIX) 40 MG tablet Take 1 tablet (40 mg total) by mouth 2 (two) times daily before a meal. 60 tablet 3   Prenatal Vit-Fe Fumarate-FA  (MULTIVITAMIN-PRENATAL) 27-0.8 MG TABS tablet Take 1 tablet by mouth 2 (two) times daily.     promethazine (PHENERGAN) 25 MG tablet Take 1 tablet by mouth every 6 hours as needed for vomiting or nausea. 120 tablet 1   rifaximin (XIFAXAN) 550 MG TABS tablet Take 1 tablet (550 mg total) by mouth 2 (two) times daily. 60 tablet 3   VITAMIN D PO Take 1 capsule by mouth 2 (two) times daily.     VITAMIN E PO Take 1 capsule by mouth daily.     Current Facility-Administered Medications on File Prior to Visit  Medication Dose Route Frequency Provider Last Rate Last Admin   promethazine (PHENERGAN) tablet 12.5-25  mg  12.5-25 mg Oral Q4H PRN Clovis Riley, MD       Or   promethazine (PHENERGAN) suppository 12.5-25 mg  12.5-25 mg Rectal Q4H PRN Romana Juniper A, MD       sodium chloride 0.9 % 1,000 mL with thiamine 446 mg, folic acid 1 mg, multivitamins adult 10 mL infusion   Intravenous Continuous Clovis Riley, MD       Allergies  Allergen Reactions   Progesterone Rash    Was in a form of birth control.   Penicillins Rash    Did it involve swelling of the face/tongue/throat, SOB, or low BP? No Did it involve sudden or severe rash/hives, skin peeling, or any reaction on the inside of your mouth or nose? Yes Did you need to seek medical attention at a hospital or doctor's office? No When did it last happen?      9 + months If all above answers are "NO", may proceed with cephalosporin use.      Current Medications, Allergies, Past Medical History, Past Surgical History, Family History and Social History were reviewed in [employer] record.  Review of Systems:   Constitutional: + Weight gain.  Respiratory: Negative for shortness of breath.   Cardiovascular: No chest pain.  Gastrointestinal: See HPI.  Musculoskeletal: Negative for back pain or muscle aches.  Neurological: Negative for dizziness, headaches or paresthesias.   Physical Exam: BP 110/70 (BP Location:  Left Arm, Patient Position: Sitting, Cuff Size: Normal)   Pulse (!) 134   Ht 5' 6.25" (1.683 m) Comment: height measured without shoes  Wt 294 lb 2 oz (133.4 kg)   LMP 02/11/2021   BMI 47.12 kg/m   Wt Readings from Last 3 Encounters:  03/13/21 294 lb 2 oz (133.4 kg)  01/20/21 270 lb (122.5 kg)  09/26/20 278 lb (126.1 kg)    General: Pale complected obese 30 year old female anxious in no acute distress Head: Normocephalic and atraumatic. Eyes: No scleral icterus. Conjunctiva pink . Ears: Normal auditory acuity. Mouth: Dentition intact. No ulcers or lesions.  Lungs: Clear throughout to auscultation. Heart: Tachycardic.  No murmur. Abdomen: Soft, nondistended.  LUQ and lower abdominal tenderness without rebound or guarding.  No ascites or anasarca.  No masses or hepatomegaly. Normal bowel sounds x 4 quadrants.  Rectal: Deferred.  Musculoskeletal: Symmetrical with no gross deformities. Extremities: Trace lower extremity edema. Neurological: Alert oriented x 4. No focal deficits.  Psychological: Alert and cooperative. Normal mood and affect  Assessment and Recommendations:  36) 30 year old female with NASH cirrhosis (MELD 7), portal HTN, EV s/p banding and splenomegaly with acute on chronic abdominal pain, currently acute LUQ pain > lower abdominal pan. CTAP 01/20/2021 without evidence of any acute intra-abdominal/pelvic inflammatory or infectious process.  She does not appear to have ascites on exam and no significant lower extremity edema.  No overt hepatic encephalopathy.  2) SOB progressively worsening over the past 1 to 2 weeks. Currently tachycardic with HR 134 b/min. SOB worse with exertion and when laying flat. She demonstrated mild active SOB when transitioning from off the exam table to chair.  No ascites on exam, low suspicion for hepatic hydrothorax.  3) GERD -Continue Pantoprazole 40 mg p.o. twice daily  4) Iron deficiency anemia. No active GI bleeding at this time.   5)  History of as sessile serrated polyp -Next colonoscopy due 08/2023  6) Anxiety, increased stress   Patient was sent to [facility] ED for  further evaluation regarding acute on chronic upper and lower abdominal pain with the recent development of shortness of breath which has progressively worsened over the past 1 to 2 weeks in setting of recent overseas airplane travel end of July-01/20/2021.  Recommend chest CTA to rule out PE and she will likely require a repeat CTAP to rule out any intra-abdominal/pelvic acute process and laboratory studies to include CBC, CMP and INR.

## 2021-03-13 NOTE — Patient Instructions (Signed)
If you are age 28 or older, your body mass index should be between 23-30. Your Body mass index is 47.12 kg/m. If this is out of the aforementioned range listed, please consider follow up with your Primary Care Provider.  If you are age 49 or younger, your body mass index should be between 19-25. Your Body mass index is 47.12 kg/m. If this is out of the aformentioned range listed, please consider follow up with your Primary Care Provider.   The [facility] providers would like to encourage you to use [facility] to communicate with providers for non-urgent requests or questions.  Due to long hold times on the telephone, sending your provider a message by [facility] may be faster and more efficient way to get a response. Please allow 48 business hours for a response.  Please remember that this is for non-urgent requests/questions.   Go to [facility] Emergency room for further evaluation of tachycardia, shortness of breath and worsening abdominal pain.  It was great seeing you today! Thank you for entrusting me with your care and choosing [facility].  Noralyn Pick, CRNP

## 2021-03-13 NOTE — ED Notes (Signed)
Per [facility]-history of cirrhosis-states she had a recent trip to [place]-having SOB, elevated HR-MD wants a CT to r/o PE

## 2021-03-14 ENCOUNTER — Telehealth: Payer: Self-pay | Admitting: Nurse Practitioner

## 2021-03-14 NOTE — Telephone Encounter (Signed)
Beth, pls call the patient this morning for an update as she was seen in office yesterday afternoon with the instructions to go to [facility] ED for  further evaluation regarding SOB, tachycardia and worsening abdominal pain. She agreed to go to the ED but did not do so. THX

## 2021-03-14 NOTE — Progress Notes (Signed)
Addendum: Reviewed and agree with assessment and management plan. Agree with ER eval given tachycardia and dyspnea. If acute eval negative then cardiology eval is recommended  Mace Weinberg, [name], MD

## 2021-03-14 NOTE — Telephone Encounter (Signed)
Tried calling the patient. No answer. Line rings and then goes to a busy signal.

## 2021-03-15 ENCOUNTER — Other Ambulatory Visit: Payer: Self-pay

## 2021-03-15 ENCOUNTER — Encounter (HOSPITAL_COMMUNITY): Payer: Self-pay

## 2021-03-15 ENCOUNTER — Emergency Department (HOSPITAL_COMMUNITY): Payer: Self-pay

## 2021-03-15 ENCOUNTER — Emergency Department (HOSPITAL_COMMUNITY)
Admission: EM | Admit: 2021-03-15 | Discharge: 2021-03-15 | Disposition: A | Discharge status: Home / Self Care | Payer: Self-pay | Attending: Emergency Medicine | Admitting: Emergency Medicine

## 2021-03-15 DIAGNOSIS — E039 Hypothyroidism, unspecified: Secondary | ICD-10-CM | POA: Insufficient documentation

## 2021-03-15 DIAGNOSIS — Z85038 Personal history of other malignant neoplasm of large intestine: Secondary | ICD-10-CM | POA: Insufficient documentation

## 2021-03-15 DIAGNOSIS — N3001 Acute cystitis with hematuria: Secondary | ICD-10-CM | POA: Insufficient documentation

## 2021-03-15 DIAGNOSIS — Z79899 Other long term (current) drug therapy: Secondary | ICD-10-CM | POA: Insufficient documentation

## 2021-03-15 DIAGNOSIS — R102 Pelvic and perineal pain: Secondary | ICD-10-CM | POA: Insufficient documentation

## 2021-03-15 DIAGNOSIS — R109 Unspecified abdominal pain: Secondary | ICD-10-CM | POA: Insufficient documentation

## 2021-03-15 DIAGNOSIS — M549 Dorsalgia, unspecified: Secondary | ICD-10-CM | POA: Insufficient documentation

## 2021-03-15 DIAGNOSIS — R000 Tachycardia, unspecified: Secondary | ICD-10-CM | POA: Insufficient documentation

## 2021-03-15 DIAGNOSIS — B379 Candidiasis, unspecified: Secondary | ICD-10-CM | POA: Insufficient documentation

## 2021-03-15 DIAGNOSIS — K219 Gastro-esophageal reflux disease without esophagitis: Secondary | ICD-10-CM | POA: Insufficient documentation

## 2021-03-15 DIAGNOSIS — J45909 Unspecified asthma, uncomplicated: Secondary | ICD-10-CM | POA: Insufficient documentation

## 2021-03-15 DIAGNOSIS — R112 Nausea with vomiting, unspecified: Secondary | ICD-10-CM | POA: Insufficient documentation

## 2021-03-15 DIAGNOSIS — I10 Essential (primary) hypertension: Secondary | ICD-10-CM | POA: Insufficient documentation

## 2021-03-15 LAB — URINALYSIS, ROUTINE W REFLEX MICROSCOPIC
Bilirubin Urine: NEGATIVE
Glucose, UA: NEGATIVE mg/dL
Ketones, ur: NEGATIVE mg/dL
Leukocytes,Ua: NEGATIVE
Nitrite: POSITIVE — AB
Protein, ur: 100 mg/dL — AB
Specific Gravity, Urine: 1.024 (ref 1.005–1.030)
WBC, UA: >50 WBC/hpf — ABNORMAL HIGH (ref 0–5)
pH: 5 (ref 5.0–8.0)

## 2021-03-15 LAB — COMPREHENSIVE METABOLIC PANEL
ALT: 16 U/L (ref 0–44)
AST: 21 U/L (ref 15–41)
Albumin: 3.5 g/dL (ref 3.5–5.0)
Alkaline Phosphatase: 75 U/L (ref 38–126)
Anion gap: 8 (ref 5–15)
BUN: 11 mg/dL (ref 6–20)
CO2: 23 mmol/L (ref 22–32)
Calcium: 7.9 mg/dL — ABNORMAL LOW (ref 8.9–10.3)
Chloride: 105 mmol/L (ref 98–111)
Creatinine, Ser: 0.95 mg/dL (ref 0.44–1.00)
GFR, Estimated: >60 mL/min (ref >60)
Glucose, Bld: 135 mg/dL — ABNORMAL HIGH (ref 70–99)
Potassium: 4.1 mmol/L (ref 3.5–5.1)
Sodium: 136 mmol/L (ref 135–145)
Total Bilirubin: 0.6 mg/dL (ref 0.3–1.2)
Total Protein: 6.1 g/dL — ABNORMAL LOW (ref 6.5–8.1)

## 2021-03-15 LAB — I-STAT BETA HCG BLOOD, ED (MC, WL, AP ONLY): I-stat hCG, quantitative: <5 m[IU]/mL (ref <5)

## 2021-03-15 LAB — CBC WITH DIFFERENTIAL/PLATELET
Abs Immature Granulocytes: 0.01 10*3/uL (ref 0.00–0.07)
Basophils Absolute: 0 10*3/uL (ref 0.0–0.1)
Basophils Relative: 1 %
Eosinophils Absolute: 0.3 10*3/uL (ref 0.0–0.5)
Eosinophils Relative: 4 %
HCT: 31.3 % — ABNORMAL LOW (ref 36.0–46.0)
Hemoglobin: 9.1 g/dL — ABNORMAL LOW (ref 12.0–15.0)
Immature Granulocytes: 0 %
Lymphocytes Relative: 24 %
Lymphs Abs: 1.4 10*3/uL (ref 0.7–4.0)
MCH: 26.1 pg (ref 26.0–34.0)
MCHC: 29.1 g/dL — ABNORMAL LOW (ref 30.0–36.0)
MCV: 89.9 fL (ref 80.0–100.0)
Monocytes Absolute: 0.3 10*3/uL (ref 0.1–1.0)
Monocytes Relative: 6 %
Neutro Abs: 3.8 10*3/uL (ref 1.7–7.7)
Neutrophils Relative %: 65 %
Platelets: 87 10*3/uL — ABNORMAL LOW (ref 150–400)
RBC: 3.48 MIL/uL — ABNORMAL LOW (ref 3.87–5.11)
RDW: 16 % — ABNORMAL HIGH (ref 11.5–15.5)
WBC: 5.8 10*3/uL (ref 4.0–10.5)
nRBC: 0 % (ref 0.0–0.2)

## 2021-03-15 LAB — POC OCCULT BLOOD, ED: Fecal Occult Bld: NEGATIVE

## 2021-03-15 LAB — LACTIC ACID, PLASMA: Lactic Acid, Venous: 1.3 mmol/L (ref 0.5–1.9)

## 2021-03-15 LAB — LIPASE, BLOOD: Lipase: 23 U/L (ref 11–51)

## 2021-03-15 MED ORDER — CEPHALEXIN 500 MG PO CAPS: 500.0000 mg | ORAL_CAPSULE | Freq: Two times a day (BID) | ORAL | 0 refills | Status: DC

## 2021-03-15 MED ORDER — SODIUM CHLORIDE 0.9 % IV SOLN
12.5000 mg | Freq: Once | INTRAVENOUS | Status: AC
  Administered 2021-03-15: 12.5 mg via INTRAVENOUS
  Filled 2021-03-15: qty 12.5

## 2021-03-15 MED ORDER — LACTATED RINGERS IV BOLUS: 500.0000 mL | Freq: Once | INTRAVENOUS | Status: DC

## 2021-03-15 MED ORDER — LIDOCAINE HCL (PF) 1 % IJ SOLN
2.1000 mL | Freq: Once | INTRAMUSCULAR | Status: AC
  Administered 2021-03-15: 2.1 mL
  Filled 2021-03-15: qty 30

## 2021-03-15 MED ORDER — MORPHINE SULFATE (PF) 2 MG/ML IV SOLN
2.0000 mg | Freq: Once | INTRAVENOUS | Status: AC
  Administered 2021-03-15: 2 mg via INTRAVENOUS
  Filled 2021-03-15: qty 1

## 2021-03-15 MED ORDER — IOHEXOL 350 MG/ML SOLN
80.0000 mL | Freq: Once | INTRAVENOUS | Status: AC | PRN
  Administered 2021-03-15: 80 mL via INTRAVENOUS

## 2021-03-15 MED ORDER — SODIUM CHLORIDE 0.9 % IV SOLN: 1.0000 g | Freq: Once | INTRAVENOUS | Status: DC

## 2021-03-15 MED ORDER — CEFTRIAXONE SODIUM 1 G IJ SOLR
1.0000 g | Freq: Once | INTRAMUSCULAR | Status: AC
  Administered 2021-03-15: 1 g via INTRAMUSCULAR
  Filled 2021-03-15: qty 10

## 2021-03-15 MED ORDER — FLUCONAZOLE 100 MG PO TABS: 100.0000 mg | ORAL_TABLET | Freq: Every day | ORAL | 0 refills | Status: DC

## 2021-03-15 NOTE — ED Triage Notes (Signed)
Pt arrived via POV, c/o SOB, tachycardia, diffuse abd pain and recent UTI. Constant nausea. Denies any diarrhea, or fevers.

## 2021-03-15 NOTE — Discharge Instructions (Signed)
Please take your entire course of antibiotics.  Please continue to use topical Monistat in addition to the antifungal that I prescribed.  Please return if your symptoms worsen or fail to improve.  Please continue to follow-up regularly with your GI, and primary care doctors.  It was a pleasure taking care of you today.

## 2021-03-15 NOTE — ED Provider Notes (Signed)
[facility] DEPT Provider Note   CSN: [id number] Arrival date & time: 03/15/21  1216     History Chief Complaint  Patient presents with   Shortness of Breath   Abdominal Pain    Linda Becker is a 30 y.o. 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  Patient also endorses flank pain, heavy breathing versus shortness of breath.  Patient reports that she has some constant abdominal pain at baseline, however this is worse at the current time.  Patient also endorses back pain.  Patient does not have any mechanism of back injury.  Patient reports her nausea is worsening, abdominal pain is sharp and unremitting.  Patient reports that she is taking AZO multiple times, as well as 3 days of doxycycline she had at home twice daily this week, as well as Monistat for presumed yeast infection.  Patient reports she has 8-02/2009 pain, and significant nausea at this point.  Patient reports she has vomited 3-4 times last week.  Patient normally is on Zofran and Phenergan at home for nausea, however she discontinued her Zofran over the last few days due to worries for heart palpitations.   Shortness of Breath Associated symptoms: abdominal pain and vomiting   Associated symptoms: no chest pain, no cough, no fever and no headaches   Abdominal Pain Associated symptoms: dysuria, hematuria, nausea, shortness of breath and vomiting   Associated symptoms: no chest pain, no constipation, no cough, no diarrhea and no fever       Past Medical History:  Diagnosis Date   Allergic rhinitis    remote hx of allergy shots   Asthma    since childhood  on controller meds extrinsic dr Carmelina Peal   Bipolar II disorder (HCC)    Cirrhosis  ([place])    Esophageal varices ([place])    Family history of adverse reaction to anesthesia    mom and sister difficult to awaken per patient    Gallbladder sludge    Gastropathy    GERD (gastroesophageal reflux disease)    on nexium  for  long term sx since childhood   H/O miscarriage, not currently pregnant    [redacted] weeks  march 2016   Hepatic steatosis    Hiatal hernia    Hypothyroidism    Migraine    Murmur    pt reports MVP   Portal hypertension (HCC)    Sessile colonic polyp    Splenomegaly    Steatohepatitis    Syncope    under eval ? 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   Idiopathic esophageal varices with bleeding ([place])    Hematemesis 01/27/2020   Liver cirrhosis secondary to NASH ([place]) 01/27/2020   Acute GI bleeding 01/27/2020   Dehydration 07/22/2019   Portal hypertension (HCC)    Chronic diarrhea    Benign  neoplasm of ascending colon    Benign neoplasm of rectum    Thrombocytopenia ([place]) 08/12/2018   Spontaneous vaginal delivery 12/22/2017   IUFD at 16 weeks or more of gestation 12/20/2017   Snoring 11/10/2017   Gestational diabetes 10/12/2017   Morbid obesity ([place]) 05/19/2017   Adult hypothyroidism 05/19/2017   BMI 40.0-44.9, adult ([place]) 11/09/2015   GERD (gastroesophageal reflux disease)    Tachycardia    Syncope    Allergic rhinitis    Persistent asthma with undetermined severity    Migraine with aura 09/26/2011    Past Surgical History:  Procedure Laterality Date   BIOPSY  09/03/2018   Procedure: BIOPSY;  Surgeon: Jerene Bears, MD;  Location: [facility];  Service: Gastroenterology;;   BIOPSY  09/26/2020   Procedure: BIOPSY;  Surgeon: Jerene Bears, MD;  Location: [facility];  Service: Gastroenterology;;   broken right femur  2008   rod placed   COLONOSCOPY WITH PROPOFOL N/A 09/03/2018   Procedure: COLONOSCOPY WITH PROPOFOL;  Surgeon: Jerene Bears, MD;  [facility]: [facility];  Service: Gastroenterology;  Laterality: N/A;   DILATION AND CURETTAGE OF UTERUS     ESOPHAGOGASTRODUODENOSCOPY (EGD) WITH PROPOFOL N/A 09/03/2018   Procedure: ESOPHAGOGASTRODUODENOSCOPY (EGD) WITH PROPOFOL;  Surgeon: Jerene Bears, MD;  [facility];  Service: Gastroenterology;  Laterality: N/A;   ESOPHAGOGASTRODUODENOSCOPY (EGD) WITH PROPOFOL N/A 01/27/2020   Procedure: ESOPHAGOGASTRODUODENOSCOPY (EGD) WITH PROPOFOL;  Surgeon: Rush Landmark Telford Nab., MD;  Location: [facility];  Service: Gastroenterology;  Laterality: N/A;   ESOPHAGOGASTRODUODENOSCOPY (EGD) WITH PROPOFOL N/A 03/12/2020   Procedure: ESOPHAGOGASTRODUODENOSCOPY (EGD) WITH PROPOFOL;  Surgeon: Jerene Bears, MD;  [facility];  Service: Gastroenterology;  Laterality: N/A;   ESOPHAGOGASTRODUODENOSCOPY (EGD) WITH PROPOFOL N/A 09/26/2020   Procedure: ESOPHAGOGASTRODUODENOSCOPY (EGD) WITH PROPOFOL;  Surgeon: Jerene Bears, MD;  [facility];  Service: Gastroenterology;  Laterality: N/A;   GASTRIC VARICES BANDING  01/27/2020   Procedure: GASTRIC VARICES BANDING;  Surgeon: Rush Landmark Telford Nab., MD;  Location: [facility];  Service: Gastroenterology;;   IR TRANSCATHETER BX  01/19/2019   IR US GUIDE VASC ACCESS RIGHT  01/19/2019   IR VENOGRAM HEPATIC W HEMODYNAMIC EVALUATION  01/19/2019   LAPAROSCOPIC GASTRIC SLEEVE RESECTION N/A 06/28/2019   Procedure: LAPAROSCOPIC GASTRIC SLEEVE RESECTION, Upper Endo, ERAS Pathway;  Surgeon: Clovis Riley, MD;  Location[facility];  Service: General;  Laterality: N/A;   OB ultrasound N/A 12/01/2017   see report   POLYPECTOMY  09/03/2018    Procedure: POLYPECTOMY;  Surgeon: Jerene Bears, MD;  [facility];  Service: Gastroenterology;;   TONSILLECTOMY  2006   WISDOM TOOTH EXTRACTION       OB History     Gravida  2   Para  1   Term      Preterm  1   AB  1   Living  0      SAB  1   IAB      Ectopic      Multiple  0   Live Births              Family History  Problem Relation Age of Onset   Asthma Father    Arthritis Father    Hyperlipidemia Father    Heart disease Father    Hypertension Father    Diabetes Father    Colon polyps Father    Arthritis Mother    Hyperlipidemia Mother    Heart disease Mother  Hypertension Mother    Diabetes Mother    Kidney disease Mother    Liver disease Mother        advanced non-alcoholic cirrhosis   Bleeding Disorder Mother    Colon polyps Mother    Arthritis Maternal Grandmother    Breast cancer Maternal Grandmother    Thyroid disease Maternal Grandmother    Colon cancer Paternal Grandfather    Diabetes Paternal Grandfather    Liver cancer Paternal Grandfather    Stomach cancer Paternal Grandfather    Breast cancer Paternal Grandmother    Diabetes Paternal Grandmother    Breast cancer Paternal Aunt    Esophageal cancer Paternal Aunt    Breast cancer Maternal Aunt    Diabetes Maternal Grandfather    Pancreatic cancer Maternal Grandfather     Social History   Tobacco Use   Smoking status: Never   Smokeless tobacco: Never  Vaping Use   Vaping Use: Never used  Substance Use Topics   Alcohol use: No    Alcohol/week: 0.0 standard drinks   Drug use: No    Home Medications Prior to Admission medications   Medication Sig Start Date End Date Taking? Authorizing Provider  albuterol (PROVENTIL) (2.5 MG/3ML) 0.083% nebulizer solution Take 3 mLs (2.5 mg total) by nebulization every 4 (four) hours as needed for wheezing or shortness of breath. 01/29/20  Yes Aline August, MD  albuterol (VENTOLIN HFA) 108 (90 Base) MCG/ACT inhaler INHALE 2  PUFFS BY MOUTH INTO THE LUNGS EVERY 4 TO 6 HOURS AS NEEDED FOR COUGH OR WHEEZE Patient taking differently: Inhale 2 puffs into the lungs every 4 (four) hours as needed for shortness of breath or wheezing. 07/19/20 07/19/21 Yes Padgett, Rae Halsted, MD  ARIPiprazole (ABILIFY) 10 MG tablet TAKE 1 TABLET BY MOUTH DAILY Patient taking differently: Take 10 mg by mouth daily. 05/28/20 05/28/21 Yes   Ascorbic Acid (VITAMIN C) 1000 MG tablet Take 2,000 mg by mouth 2 (two) times daily.   Yes [provider]  Beclomethasone Dipropionate (QNASL) 80 MCG/ACT AERS INSTILL 1 SPRAY IN EACH NOSTRIL ONCE DAILY AS DIRECTED Patient taking differently: Place 1 spray into the nose 2 (two) times daily. 09/29/19  Yes Kozlow, Donnamarie Poag, MD  CALCIUM PO Take 1 tablet by mouth 2 (two) times daily.   Yes [provider]  cephALEXin (KEFLEX) 500 MG capsule Take 1 capsule (500 mg total) by mouth 2 (two) times daily. 03/15/21  Yes Charliegh Vasudevan H, PA-C  Cetirizine HCl 10 MG CAPS Take 10 mg by mouth 2 (two) times daily.    Yes [provider]  diphenhydrAMINE (BENADRYL) 25 MG tablet Take 50 mg by mouth at bedtime as needed for allergies or sleep.   Yes [provider]  ferrous sulfate 325 (65 FE) MG tablet Take 1 tablet (325 mg total) by mouth daily with breakfast. 01/28/21  Yes Pyrtle, [name], MD  fluconazole (DIFLUCAN) 100 MG tablet Take 1 tablet (100 mg total) by mouth daily. Take 1 additional tablet in one week if symptoms not fully resolved 03/15/21  Yes Aneesah Hernan H, PA-C  HYDROcodone-acetaminophen (NORCO/VICODIN) 5-325 MG tablet Take 1 tablet by mouth every 12 (twelve) hours as needed for moderate pain or severe pain. 02/21/21  Yes Pyrtle, [name], MD  lamoTRIgine (LAMICTAL) 100 MG tablet 1 TAB BY MOUTH EVERY NIGHT AT BEDTIME (TOTAL DOSE 300MG BEGIN IN 2 WEEKS)IF OUT FOR MORE THAN 1 WEEK DO NOT RESTART OR IF RASH OCCURS CALL OFFICE Patient taking differently: Take  100 mg by mouth daily.  04/12/20 04/12/21 Yes   montelukast (SINGULAIR) 10 MG tablet TAKE 1 TABLET BY MOUTH ONCE DAILY Patient taking differently: Take 10 mg by mouth daily. 08/07/20 08/07/21 Yes Kozlow, Donnamarie Poag, MD  nadolol (CORGARD) 20 MG tablet Take 1 tablet (20 mg total) by mouth daily. 10/01/20 10/01/21 Yes Pyrtle, [name], MD  Olopatadine HCl 0.2 % SOLN Can use one drop in each eye once daily if needed. Patient taking differently: Place 1 drop into both eyes daily as needed (allergies). 09/29/19  Yes Kozlow, Donnamarie Poag, MD  ondansetron (ZOFRAN-ODT) 8 MG disintegrating tablet DISSOLVE 1 TABLET BY MOUTH EVERY 8 HOURS AS NEEDED FOR NAUSEA OR VOMITING Patient taking differently: Take 8 mg by mouth every 8 (eight) hours as needed for nausea or vomiting. 08/23/20 08/23/21 Yes Zehr, Laban Emperor, PA-C  pantoprazole (PROTONIX) 40 MG tablet Take 1 tablet (40 mg total) by mouth 2 (two) times daily before a meal. 11/22/20 11/22/21 Yes Pyrtle, [name], MD  Prenatal Vit-Fe Fumarate-FA (MULTIVITAMIN-PRENATAL) 27-0.8 MG TABS tablet Take 1 tablet by mouth 2 (two) times daily.   Yes [provider]  promethazine (PHENERGAN) 25 MG tablet Take 1 tablet by mouth every 6 hours as needed for vomiting or nausea. 01/24/21  Yes Pyrtle, [name], MD  VITAMIN D PO Take 1 capsule by mouth 2 (two) times daily.   Yes [provider]  VITAMIN E PO Take 1 capsule by mouth daily.   Yes [provider]  ipratropium-albuterol (DUONEB) 0.5-2.5 (3) MG/3ML SOLN INHALE 1 VIAL BY NEBULIZATION EVERY 6 HOURS AS NEEDED Patient not taking: Reported on 03/15/2021 07/19/20 07/19/21  Kennith Gain, MD  lamoTRIgine (LAMICTAL) 25 MG tablet Take 1 tablet by mouth once a day for 2 weeks, then take 2 tablets by mouth daily for 2 weeks. Patient not taking: No sig reported 10/03/20       Allergies    Progesterone and Penicillins  Review of Systems   Review of Systems  Constitutional:  Negative for fever.  Respiratory:  Positive for shortness of breath.  Negative for cough.   Cardiovascular:  Negative for chest pain and leg swelling.  Gastrointestinal:  Positive for abdominal pain, nausea and vomiting. Negative for constipation and diarrhea.  Genitourinary:  Positive for dysuria, flank pain, frequency, hematuria and pelvic pain.       Vaginal itching  Neurological:  Positive for dizziness. Negative for headaches.  All other systems reviewed and are negative.  Physical Exam Updated Vital Signs BP (!) 149/68   Pulse (!) 127   Temp 98.4 F (36.9 C) (Oral)   Resp (!) 25   LMP 02/02/2021 (Exact Date)   SpO2 97%   Physical Exam Vitals and nursing note reviewed.  Constitutional:      General: She is not in acute distress.    Appearance: Normal appearance.  HENT:     Head: Normocephalic and atraumatic.     Nose: No congestion or rhinorrhea.  Eyes:     General:        Right eye: No discharge.        Left eye: No discharge.  Cardiovascular:     Rate and Rhythm: Regular rhythm. Tachycardia present.     Heart sounds: No murmur heard.   No friction rub. No gallop.  Pulmonary:     Comments: Tachypnea, without rhonchi, rales, stridor, respiratory distress no accessory muscle use. Chest:     Chest wall: No tenderness.  Abdominal:  General: Bowel sounds are normal.     Palpations: Abdomen is soft.     Comments: Patient has some suprapubic tenderness, minimal left CVA tenderness.  Genitourinary:    Comments: External rectum normal.  Palpation of rectum is significant for significant thrombosed internal hemorrhoids, at least 2 palpated.  No bright red blood per rectum.  Hemoccult negative. Skin:    General: Skin is warm and dry.     Capillary Refill: Capillary refill takes less than 2 seconds.     Findings: Bruising present.  Neurological:     Mental Status: She is alert and oriented to person, place, and time.  Psychiatric:        Mood and Affect: Mood normal.        Behavior: Behavior normal.    ED Results / Procedures /  Treatments   Labs (all labs ordered are listed, but only abnormal results are displayed) Labs Reviewed  CBC WITH DIFFERENTIAL/PLATELET - Abnormal; Notable for the following components:      Result Value   RBC 3.48 (*)    Hemoglobin 9.1 (*)    HCT 31.3 (*)    MCHC 29.1 (*)    RDW 16.0 (*)    Platelets 87 (*)    All other components within normal limits  COMPREHENSIVE METABOLIC PANEL - Abnormal; Notable for the following components:   Glucose, Bld 135 (*)    Calcium 7.9 (*)    Total Protein 6.1 (*)    All other components within normal limits  URINALYSIS, ROUTINE W REFLEX MICROSCOPIC - Abnormal; Notable for the following components:   Color, Urine AMBER (*)    APPearance CLOUDY (*)    Hgb urine dipstick SMALL (*)    Protein, ur 100 (*)    Nitrite POSITIVE (*)    WBC, UA >50 (*)    Bacteria, UA MANY (*)    All other components within normal limits  URINE CULTURE  LIPASE, BLOOD  LACTIC ACID, PLASMA  I-STAT BETA HCG BLOOD, ED ([facility], [facility], [facility] ONLY)  POC OCCULT BLOOD, ED    EKG None  Radiology DG Chest 2 View  Result Date: 03/15/2021 CLINICAL DATA:  Shortness of breath EXAM: CHEST - 2 VIEW COMPARISON:  01/27/2020 FINDINGS: The heart size and mediastinal contours are within normal limits. No focal airspace consolidation, pleural effusion, or pneumothorax. The visualized skeletal structures are unremarkable. IMPRESSION: No active cardiopulmonary disease. Electronically Signed   By: Davina Poke D.O.   On: 03/15/2021 14:36   CT ABDOMEN PELVIS W CONTRAST  Result Date: 03/15/2021 CLINICAL DATA:  Right lower quadrant abdominal pain. EXAM: CT ABDOMEN AND PELVIS WITH CONTRAST TECHNIQUE: Multidetector CT imaging of the abdomen and pelvis was performed using the standard protocol following bolus administration of intravenous contrast. CONTRAST:  62m OMNIPAQUE IOHEXOL 350 MG/ML SOLN COMPARISON:  CT abdomen and pelvis 01/20/2021. FINDINGS: Lower chest: No acute abnormality. Hepatobiliary:  Again seen are changes of cirrhosis with recanalization of the umbilical vein. No focal liver lesions are seen. The gallbladder and bile ducts are within normal limits. Pancreas: Unremarkable. No pancreatic ductal dilatation or surrounding inflammatory changes. Spleen: Spleen is enlarged, unchanged. No focal splenic lesions are seen. Adrenals/Urinary Tract: Adrenal glands are unremarkable. Kidneys are normal, without renal calculi, focal lesion, or hydronephrosis. Bladder is unremarkable. Stomach/Bowel: There is no free air. Appendix is not visualized. There is a large amount of stool throughout the colon. There are postsurgical changes in the stomach. No evidence of bowel wall thickening, distention, or inflammatory  changes. Vascular/Lymphatic: Aorta and IVC are within normal limits. Splenic varices are again noted. No enlarged lymph nodes are identified. Reproductive: Uterus and bilateral adnexa are unremarkable. Other: There is trace free fluid in the right lower quadrant. No focal abdominal wall hernia. Musculoskeletal: No acute or significant osseous findings. Right femoral nail is partially visualized. IMPRESSION: 1. Trace free fluid in the right lower quadrant. 2. The appendix is not visualized. 3. Stable findings compatible with cirrhosis and portal hypertension with splenomegaly. Electronically Signed   By: Ronney Asters M.D.   On: 03/15/2021 16:11    Procedures Procedures   Medications Ordered in ED Medications  cefTRIAXone (ROCEPHIN) injection 1 g (has no administration in time range)  lidocaine (PF) (XYLOCAINE) 1 % injection 2.1 mL (has no administration in time range)  iohexol (OMNIPAQUE) 350 MG/ML injection 80 mL (80 mLs Intravenous Contrast Given 03/15/21 1530)  morphine 2 MG/ML injection 2 mg (2 mg Intravenous Given 03/15/21 1618)  promethazine (PHENERGAN) 12.5 mg in sodium chloride 0.9 % 50 mL IVPB (12.5 mg Intravenous New Bag/Given 03/15/21 1609)    ED Course  I have reviewed the triage  vital signs and the nursing notes.  Pertinent labs & imaging results that were available during my care of the patient were reviewed by me and considered in my medical decision making (see chart for details).    MDM Rules/Calculators/A&P                         Urinalysis significant for UTI, nitrite positive.  Lab work otherwise unremarkable other than hemoglobin of 9.1 down from 10.8 a month ago.  Patient does endorse some rectal bleeding.  Patient denies hematemesis.  Does endorse some blood in her urine.  Known history of internal hemorrhoids, in context of systemic portal hypertension.  Patient does report a worsening of her rectal bleeding over the last month.  Patient denies active persistent rectal bleeding, however she does report seeing some blood in the toilet occasionally as well as with wiping.  Hemoccult performed and negative, no suspicion of an upper GI bleed.  Patient denies heavy menstrual flow, or other sources of bleeding.  CT of her abdomen is without abnormality.  Urinalysis significant for UTI. Patient denies colicky abdominal pain, and CT clear, minimal clinical concern for infected renal stone at this time.  Patient with signs and symptoms of a yeast infection, no new sexual partners, or concern for STI, will defer pelvic examination at this time.  Patient does not have signs or symptoms of a systemic infection, normal white count.  Patient pain, nausea significantly improved with morphine, and Phenergan.  Tachycardia improved down to patient's baseline of around 110 despite no treatment.  Some transient increase can be explained by patient's current infection, and worsening anemia.  Encourage patient to continue her iron supplementation.  We will give an IM dose of ceftriaxone at this time.  Will prescribe Keflex, fluconazole for home use.  Patient discharged in stable condition. Return precautions are given. Final Clinical Impression(s) / ED Diagnoses Final diagnoses:  Acute  cystitis with hematuria  Yeast infection    Rx / DC Orders ED Discharge Orders          Ordered    cephALEXin (KEFLEX) 500 MG capsule  2 times daily        03/15/21 1748    fluconazole (DIFLUCAN) 100 MG tablet  Daily        03/15/21 1748  Dorien Chihuahua 03/15/21 1755    Davonna Belling, MD 03/15/21 [phone]

## 2021-03-15 NOTE — ED Provider Notes (Signed)
Emergency Medicine Provider Triage Evaluation Note  Linda Becker , a 30 y.o. female  was evaluated in triage.  Pt complains of shortness of breath, flank pain, abdominal pain, and dysuria.  Patient reports that she has had dysuria over the last 2 weeks as well as urinary urgency.  Patient suspects UTI however has not received any antibiotics to treat this.  Patient reports that she has had gradually worsening lower abdominal pain and flank pain over this time.  Patient reports that she has had shortness of breath over the last few days.  Review of Systems  Positive: Shortness of breath, flank pain, abdominal pain, dysuria, urinary urgency, nausea Negative: Fever, chills, vaginal pain, vaginal bleeding, vaginal discharge, hemoptysis, unilateral leg swelling or tenderness  Physical Exam  BP (!) 161/103 (BP Location: Right Arm)   Pulse (!) 140   Temp 98.4 F (36.9 C) (Oral)   Resp (!) 30   LMP 02/02/2021   SpO2 100%  Gen:   Awake, no distress   Resp:  Normal effort, lungs clear to auscultation bilaterally.  Speaks in full complete sentences without difficulty. MSK:   Moves extremities without difficulty; no swelling or tenderness to bilateral lower extremities. Other:  Abdomen soft, nondistended, diffuse tenderness to lower abdomen.  Positive left CVA tenderness.  No guarding or rebound tenderness.  Medical Decision Making  Medically screening exam initiated at 12:59 PM.  Appropriate orders placed.  Linda Becker was informed that the remainder of the evaluation will be completed by another provider, this initial triage assessment does not replace that evaluation, and the importance of remaining in the ED until their evaluation is complete.  The patient appears stable so that the remainder of the work up may be completed by another provider.      Loni Beckwith, PA-C 03/15/21 1300    Truddie Hidden, MD 03/15/21 [phone]

## 2021-03-18 ENCOUNTER — Telehealth: Payer: Self-pay

## 2021-03-18 ENCOUNTER — Other Ambulatory Visit (HOSPITAL_COMMUNITY): Payer: Self-pay

## 2021-03-18 LAB — URINE CULTURE: Culture: 60000 — AB

## 2021-03-18 MED ORDER — ESCITALOPRAM OXALATE 10 MG PO TABS
ORAL_TABLET | ORAL | 2 refills | Status: DC
  Filled 2021-03-18: qty 30, 30d supply, fill #0
  Filled 2021-04-09: qty 30, 30d supply, fill #1
  Filled 2021-05-05: qty 30, 30d supply, fill #2

## 2021-03-18 MED ORDER — HYDROXYZINE HCL 25 MG PO TABS
25.0000 mg | ORAL_TABLET | Freq: Every evening | ORAL | 2 refills | Status: DC | PRN
  Filled 2021-03-18: qty 30, 30d supply, fill #0
  Filled 2021-04-09: qty 30, 30d supply, fill #1
  Filled 2021-05-05: qty 30, 30d supply, fill #2

## 2021-03-18 MED ORDER — GABAPENTIN 100 MG PO CAPS
100.0000 mg | ORAL_CAPSULE | Freq: Every day | ORAL | 2 refills | Status: DC
  Filled 2021-03-18: qty 30, 30d supply, fill #0
  Filled 2021-04-09: qty 30, 30d supply, fill #1
  Filled 2021-05-05: qty 30, 30d supply, fill #2

## 2021-03-18 NOTE — Telephone Encounter (Addendum)
-----   Message from Noralyn Pick, NP sent at 03/18/2021  6:04 AM EDT ----- Author: Noralyn Pick, NP Service: Gastroenterology Author Type: Nurse Practitioner  Filed: 03/18/2021  6:07 AM Encounter Date: 03/13/2021 Status: Signed  Editor: Noralyn Pick, NP (Nurse Practitioner)        Dr. Orion Crook, [name] went to the ED on 03/15/2021, she was treated for a possible UTI. SOB and tachycardia was noted on ED provider notes but chest CTA was not done.    Beth, pls call patient today and provide her with a cardiology consult to evaluate her tachycardia and SOB. If she has any chest pain, palpitations or worsening SOB then she needs to go back to the ED. THX.

## 2021-03-18 NOTE — Telephone Encounter (Signed)
Message sent to the patient through My Chart.

## 2021-03-18 NOTE — Telephone Encounter (Addendum)
The patient has responded to me. She is an established patient with Dr Einar Gip. I have transmitted the office note to his office. The patient will call Cardiology  and schedule her appointment.

## 2021-03-18 NOTE — Progress Notes (Signed)
Dr. [name], Juluis Rainier, Linda Becker went to the ED on 03/15/2021, she was treated for a possible UTI. SOB and tachycardia was noted on ED provider notes but chest CTA was not done.   Beth, pls call patient today and provide her with a cardiology consult to evaluate her tachycardia and SOB. If she has any chest pain, palpitations or worsening SOB then she needs to go back to the ED. THX.

## 2021-03-18 NOTE — Telephone Encounter (Signed)
  Called the patient cell phone. Phone rings and then goes to a busy signal.

## 2021-03-19 ENCOUNTER — Telehealth: Payer: Self-pay

## 2021-03-19 ENCOUNTER — Other Ambulatory Visit (HOSPITAL_COMMUNITY): Payer: Self-pay

## 2021-03-19 NOTE — Telephone Encounter (Signed)
Post ED Visit - Positive Culture Follow-up  Culture report reviewed by antimicrobial stewardship pharmacist: [place] Team []  Elenor Quinones, Pharm.D. []  Heide Guile, Pharm.D., BCPS AQ-ID []  Parks Neptune, Pharm.D., BCPS []  Alycia Rossetti, Pharm.D., BCPS []  [place], [place].D., BCPS, AAHIVP []  Legrand Como, Pharm.D., BCPS, AAHIVP []  Salome Arnt, PharmD, BCPS []  Johnnette Gourd, PharmD, BCPS []  Hughes Better, PharmD, BCPS []  Leeroy Cha, PharmD []  Laqueta Linden, PharmD, BCPS []  Albertina Parr, PharmD  [place] Team []  Leodis Sias, PharmD []  Lindell Spar, PharmD []  Royetta Asal, PharmD []  Graylin Shiver, Rph []  Rema Fendt) Glennon Mac, PharmD []  Arlyn Dunning, PharmD []  Netta Cedars, PharmD []  Dia Sitter, PharmD []  Leone Haven, PharmD []  Gretta Arab, PharmD []  Theodis Shove, PharmD []  Peggyann Juba, PharmD []  Reuel Boom, PharmD   Positive Urine culture Treated with Cephalexin and Fluconazole, organism sensitive to the same and no further patient follow-up is required at this time.  Glennon Hamilton 03/19/2021, 11:34 AM

## 2021-03-20 ENCOUNTER — Other Ambulatory Visit: Payer: Self-pay

## 2021-03-20 ENCOUNTER — Other Ambulatory Visit (HOSPITAL_COMMUNITY): Payer: Self-pay

## 2021-03-20 MED ORDER — FAMOTIDINE 40 MG PO TABS
40.0000 mg | ORAL_TABLET | Freq: Every day | ORAL | 1 refills | Status: DC
  Filled 2021-03-20: qty 30, 30d supply, fill #0
  Filled 2021-04-16: qty 30, 30d supply, fill #1

## 2021-03-20 NOTE — Telephone Encounter (Signed)
Patient is scheduled for cardiology appointment on 03/25/21.

## 2021-03-20 NOTE — Telephone Encounter (Signed)
Beth, pls contact the patient and have her take Gaviscon 1TBSP tid and send RX for Famotidine 79m one tab po Q HS # 30, 1 refill. Continue Protonix 49mpo bid. Patient to call office if no improvement. Thx

## 2021-03-21 ENCOUNTER — Other Ambulatory Visit: Payer: Self-pay | Admitting: Internal Medicine

## 2021-03-24 ENCOUNTER — Other Ambulatory Visit: Payer: Self-pay | Admitting: Internal Medicine

## 2021-03-24 MED FILL — Ondansetron Orally Disintegrating Tab 8 MG: ORAL | 20 days supply | Qty: 60 | Fill #1 | Status: AC

## 2021-03-24 MED FILL — Albuterol Sulfate Inhal Aero 108 MCG/ACT (90MCG Base Equiv): RESPIRATORY_TRACT | 16 days supply | Qty: 8.5 | Fill #1 | Status: AC

## 2021-03-25 ENCOUNTER — Other Ambulatory Visit (HOSPITAL_COMMUNITY): Payer: Self-pay

## 2021-03-25 ENCOUNTER — Ambulatory Visit: Payer: 59 | Admitting: Student

## 2021-03-25 MED ORDER — PROMETHAZINE HCL 25 MG PO TABS
25.0000 mg | ORAL_TABLET | Freq: Four times a day (QID) | ORAL | 0 refills | Status: DC | PRN
  Filled 2021-03-25 – 2021-03-28 (×2): qty 120, 30d supply, fill #0

## 2021-03-26 ENCOUNTER — Other Ambulatory Visit: Payer: Self-pay

## 2021-03-26 ENCOUNTER — Other Ambulatory Visit (HOSPITAL_COMMUNITY): Payer: Self-pay

## 2021-03-26 ENCOUNTER — Encounter: Payer: Self-pay | Admitting: Student

## 2021-03-26 ENCOUNTER — Ambulatory Visit: Payer: 59 | Admitting: Student

## 2021-03-26 VITALS — BP 111/58 | HR 107 | Temp 98.0°F | Resp 17 | Ht 66.25 in | Wt 305.0 lb

## 2021-03-26 DIAGNOSIS — R000 Tachycardia, unspecified: Secondary | ICD-10-CM

## 2021-03-26 DIAGNOSIS — R9431 Abnormal electrocardiogram [ECG] [EKG]: Secondary | ICD-10-CM

## 2021-03-26 MED ORDER — VERAPAMIL HCL ER 120 MG PO TBCR
120.0000 mg | EXTENDED_RELEASE_TABLET | Freq: Every day | ORAL | 3 refills | Status: DC
  Filled 2021-03-26: qty 30, 30d supply, fill #0
  Filled 2021-04-23: qty 30, 30d supply, fill #1

## 2021-03-26 NOTE — Progress Notes (Signed)
Primary Physician/Referring:  Burnis Medin, MD  Patient ID: Linda Becker, female    DOB: Jun 24, 1990, 30 y.o.   MRN: [id number]  Chief Complaint  Patient presents with   Follow-up   Tachycardia   ER FOLLOW UP   HPI:    Linda Becker  is a 30 y.o. Caucasian female with history of migraines, liver cirrhosis secondary to NASH/portal hypertension, thrombocytopenia, hypothyroidism, and chronic palpitations.  She was last seen in our office 03/17/2019 by Binnie Kand, NP, for preoperative cardiac evaluation and elevated heart rate.  Patient did have gastric sleeve procedure done in January 2021, unfortunately she has gained approximately 30 pounds since then.  Patient had GI bleed in 01/2020 secondary to esophageal varices.  Due to underlying cirrhosis patient has splenomegaly and thrombocytopenia as well as chronic anemia all of which are presently managed by GI.  She is currently being evaluated by [facility] for liver transplant.  Last month patient was seen by her GI in the office and heart rate was elevated at 140 bpm.  Patient states her baseline resting heart rate is approximately 110 bpm.  Patient was advised by GI office to go to the ER, however she went home instead and tried vagal maneuvers with relief.  However 2 days later due to concerns for UTI and continued tachycardia patient went to the ED for further evaluation.  Heart rate improved to baseline of about 110 bpm, however she was advised to follow-up with our office.  Since evaluation in the ED patient has had 3 episodes of fatigue and shortness of breath associated with heart rate approximately 130 bpm.  Uses vagal maneuvers with relief typically.  Patient had previously undergone 30-day event monitor in 2016 and again in 2019 without significant arrhythmias.  Echocardiogram in 2019 showed mild LVH but was otherwise normal.  Patient was therefore advised to follow-up as needed.  She now presents for follow-up after recent ED  evaluation on 03/15/2021 at which time she was diagnosed with UTI, at the time of discharge patient's heart rate was at baseline approximately 110 bpm.  Patient denies chest pain, dizziness, syncope, near syncope.  She is presently taking nadolol for portal hypertension.  Past Medical History:  Diagnosis Date   Allergic rhinitis    remote hx of allergy shots   Asthma    since childhood  on controller meds extrinsic dr Carmelina Peal   Bipolar II disorder (HCC)    Cirrhosis ([place])    Esophageal varices ([place])    Family history of adverse reaction to anesthesia    mom and sister difficult to awaken per patient    Gallbladder sludge    Gastropathy    GERD (gastroesophageal reflux disease)    on nexium  for  long term sx since childhood   H/O miscarriage, not currently pregnant    [redacted] weeks  march 2016   Hepatic steatosis    Hiatal hernia    Hypothyroidism    Migraine    Murmur    pt reports MVP   Portal hypertension ([place])    Sessile colonic polyp    Splenomegaly    Steatohepatitis    Syncope    under eval ? cause   Tachycardia    episodes with near syncope eval dr Nadyne Coombes  on no meds dced LABA   Thrombocytopenia ([place])    Upper GI bleed    Past Surgical History:  Procedure Laterality Date   BIOPSY  09/03/2018   Procedure: BIOPSY;  Surgeon:  Pyrtle, [name], MD;  Location: [facility];  Service: Gastroenterology;;   BIOPSY  09/26/2020   Procedure: BIOPSY;  Surgeon: Jerene Bears, MD;  Location: [facility];  Service: Gastroenterology;;   broken right femur  2008   rod placed   COLONOSCOPY WITH PROPOFOL N/A 09/03/2018   Procedure: COLONOSCOPY WITH PROPOFOL;  Surgeon: Jerene Bears, MD;  Location: [facility];  Service: Gastroenterology;  Laterality: N/A;   DILATION AND CURETTAGE OF UTERUS     ESOPHAGOGASTRODUODENOSCOPY (EGD) WITH PROPOFOL N/A 09/03/2018   Procedure: ESOPHAGOGASTRODUODENOSCOPY (EGD) WITH PROPOFOL;  Surgeon: Jerene Bears, MD;  Location: [facility];  Service:  Gastroenterology;  Laterality: N/A;   ESOPHAGOGASTRODUODENOSCOPY (EGD) WITH PROPOFOL N/A 01/27/2020   Procedure: ESOPHAGOGASTRODUODENOSCOPY (EGD) WITH PROPOFOL;  Surgeon: Rush Landmark Telford Nab., MD;  Location: [place];  Service: Gastroenterology;  Laterality: N/A;   ESOPHAGOGASTRODUODENOSCOPY (EGD) WITH PROPOFOL N/A 03/12/2020   Procedure: ESOPHAGOGASTRODUODENOSCOPY (EGD) WITH PROPOFOL;  Surgeon: Jerene Bears, MD;  Location: [facility];  Service: Gastroenterology;  Laterality: N/A;   ESOPHAGOGASTRODUODENOSCOPY (EGD) WITH PROPOFOL N/A 09/26/2020   Procedure: ESOPHAGOGASTRODUODENOSCOPY (EGD) WITH PROPOFOL;  Surgeon: Jerene Bears, MD;  Location: [facility];  Service: Gastroenterology;  Laterality: N/A;   GASTRIC VARICES BANDING  01/27/2020   Procedure: GASTRIC VARICES BANDING;  Surgeon: Rush Landmark Telford Nab., MD;  Location: [facility]COPY;  Service: Gastroenterology;;   IR TRANSCATHETER BX  01/19/2019   IR US GUIDE VASC ACCESS RIGHT  01/19/2019   IR VENOGRAM HEPATIC W HEMODYNAMIC EVALUATION  01/19/2019   LAPAROSCOPIC GASTRIC SLEEVE RESECTION N/A 06/28/2019   Procedure: LAPAROSCOPIC GASTRIC SLEEVE RESECTION, Upper Endo, ERAS Pathway;  Surgeon: Clovis Riley, MD;  Location: [facility];  Service: General;  Laterality: N/A;   OB ultrasound N/A 12/01/2017   see report   POLYPECTOMY  09/03/2018   Procedure: POLYPECTOMY;  Surgeon: Jerene Bears, MD;  Location: [facility];  Service: Gastroenterology;;   TONSILLECTOMY  2006   WISDOM TOOTH EXTRACTION     Family History  Problem Relation Age of Onset   Liver disease Mother 37       advanced non-alcoholic cirrhosis   Arthritis Mother    Hyperlipidemia Mother    Heart disease Mother    Hypertension Mother    Diabetes Mother    Kidney disease Mother    Bleeding Disorder Mother    Colon polyps Mother    Asthma Father    Arthritis Father    Hyperlipidemia Father    Heart disease Father    Hypertension Father    Diabetes Father    Colon polyps  Father    Breast cancer Maternal Aunt    Breast cancer Paternal Aunt    Esophageal cancer Paternal Aunt    Arthritis Maternal Grandmother    Breast cancer Maternal Grandmother    Thyroid disease Maternal Grandmother    Diabetes Maternal Grandfather    Pancreatic cancer Maternal Grandfather    Breast cancer Paternal Grandmother    Diabetes Paternal Grandmother    Colon cancer Paternal Grandfather    Diabetes Paternal Grandfather    Liver cancer Paternal Grandfather    Stomach cancer Paternal Grandfather     Social History   Tobacco Use   Smoking status: Never   Smokeless tobacco: Never  Substance Use Topics   Alcohol use: No    Alcohol/week: 0.0 standard drinks   Marital Status: Married   ROS  Review of Systems  Constitutional: Positive for malaise/fatigue. Negative for weight gain.  Cardiovascular:  Positive  for palpitations. Negative for chest pain, claudication, leg swelling, near-syncope, orthopnea, paroxysmal nocturnal dyspnea and syncope.  Respiratory:  Positive for shortness of breath.   Neurological:  Negative for dizziness.   Objective  Blood pressure (!) 111/58, pulse (!) 107, temperature 98 F (36.7 C), temperature source Temporal, resp. rate 17, height 5' 6.25" (1.683 m), weight (!) 305 lb (138.3 kg), SpO2 96 %.  Vitals with BMI 03/26/2021 03/15/2021 03/15/2021  Height 5' 6.25" - -  Weight 305 lbs - -  BMI 24.09 - -  Systolic 735 329 924  Diastolic 58 99 68  Pulse 268 127 127   Orthostatic VS for the past 72 hrs (Last 3 readings):  Orthostatic BP Patient Position BP Location Cuff Size Orthostatic Pulse  03/26/21 0902 131/63 Standing Left Arm Large 110  03/26/21 0901 121/72 Sitting Left Arm Large 109  03/26/21 0900 123/64 Supine Left Arm Large 101      Physical Exam Vitals reviewed.  Constitutional:      Appearance: She is obese.  HENT:     Head: Normocephalic and atraumatic.  Cardiovascular:     Rate and Rhythm: Regular rhythm. Tachycardia present.      Pulses: Intact distal pulses.     Heart sounds: S1 normal and S2 normal. No murmur heard.   No gallop.  Pulmonary:     Effort: Pulmonary effort is normal. No respiratory distress.     Breath sounds: No wheezing, rhonchi or rales.  Musculoskeletal:     Right lower leg: No edema.     Left lower leg: No edema.  Neurological:     Mental Status: She is alert.     Laboratory examination:   Recent Labs    01/20/21 1605 01/25/21 1136 03/15/21 1338  NA 144 141 136  K 4.4 4.6 4.1  CL 112* 112 105  CO2 23 22 23   GLUCOSE 102* 87 135*  BUN 9 7 11   CREATININE 0.82 0.79 0.95  CALCIUM 8.7* 8.6 7.9*  GFRNONAA >60  --  >60   estimated creatinine clearance is 127.1 mL/min (by C-G formula based on SCr of 0.95 mg/dL).  CMP Latest Ref Rng & Units 03/15/2021 01/25/2021 01/20/2021  Glucose 70 - 99 mg/dL 135(H) 87 102(H)  BUN 6 - 20 mg/dL 11 7 9   Creatinine 0.44 - 1.00 mg/dL 0.95 0.79 0.82  Sodium 135 - 145 mmol/L 136 141 144  Potassium 3.5 - 5.1 mmol/L 4.1 4.6 4.4  Chloride 98 - 111 mmol/L 105 112 112(H)  CO2 22 - 32 mmol/L 23 22 23   Calcium 8.9 - 10.3 mg/dL 7.9(L) 8.6 8.7(L)  Total Protein 6.5 - 8.1 g/dL 6.1(L) 6.5 6.7  Total Bilirubin 0.3 - 1.2 mg/dL 0.6 0.3 0.4  Alkaline Phos 38 - 126 U/L 75 76 93  AST 15 - 41 U/L 21 28 31   ALT 0 - 44 U/L 16 22 38   CBC Latest Ref Rng & Units 03/15/2021 01/25/2021 01/20/2021  WBC 4.0 - 10.5 K/uL 5.8 3.7(L) 3.4(L)  Hemoglobin 12.0 - 15.0 g/dL 9.1(L) 10.8(L) 10.0(L)  Hematocrit 36.0 - 46.0 % 31.3(L) 33.9(L) 32.9(L)  Platelets 150 - 400 K/uL 87(L) 96.0(L) 77(L)    Lipid Panel No results for input(s): CHOL, TRIG, LDLCALC, VLDL, HDL, CHOLHDL, LDLDIRECT in the last 8760 hours.  HEMOGLOBIN A1C Lab Results  Component Value Date   HGBA1C 5.2 03/11/2019   TSH Recent Labs    05/01/20 1605  TSH 0.72    External labs:  03/18/2021: Hgb 9.6, platelet  95 Sodium 139, potassium 4.9, BUN 9, creatinine 1.11, ALT 21, AST 20 A1c 5.2% TSH 6.07  Allergies    Allergies  Allergen Reactions   Progesterone Rash    Was in a form of birth control.   Penicillins Rash    Did it involve swelling of the face/tongue/throat, SOB, or low BP? No Did it involve sudden or severe rash/hives, skin peeling, or any reaction on the inside of your mouth or nose? Yes Did you need to seek medical attention at a hospital or doctor's office? No When did it last happen?      9 + months If all above answers are "NO", may proceed with cephalosporin use.      Medications Prior to Visit:   Outpatient Medications Prior to Visit  Medication Sig Dispense Refill   albuterol (PROVENTIL) (2.5 MG/3ML) 0.083% nebulizer solution Take 3 mLs (2.5 mg total) by nebulization every 4 (four) hours as needed for wheezing or shortness of breath.     albuterol (VENTOLIN HFA) 108 (90 Base) MCG/ACT inhaler INHALE 2 PUFFS BY MOUTH INTO THE LUNGS EVERY 4 TO 6 HOURS AS NEEDED FOR COUGH OR WHEEZE (Patient taking differently: Inhale 2 puffs into the lungs every 4 (four) hours as needed for shortness of breath or wheezing.) 8.5 g 1   Ascorbic Acid (VITAMIN C) 1000 MG tablet Take 2,000 mg by mouth 2 (two) times daily.     Beclomethasone Dipropionate (QNASL) 80 MCG/ACT AERS INSTILL 1 SPRAY IN EACH NOSTRIL ONCE DAILY AS DIRECTED (Patient taking differently: Place 1 spray into the nose 2 (two) times daily.) 31.8 g 1   CALCIUM PO Take 1 tablet by mouth 2 (two) times daily.     cephALEXin (KEFLEX) 500 MG capsule Take 1 capsule (500 mg total) by mouth 2 (two) times daily. 20 capsule 0   Cetirizine HCl 10 MG CAPS Take 10 mg by mouth 2 (two) times daily.      diphenhydrAMINE (BENADRYL) 25 MG tablet Take 50 mg by mouth at bedtime as needed for allergies or sleep.     escitalopram (LEXAPRO) 10 MG tablet Take 1/2 tablet by mouth daily for 2 weeks then take 1 tablet daily 30 tablet 2   famotidine (PEPCID) 40 MG tablet Take 1 tablet by mouth at bedtime. 30 tablet 1   ferrous sulfate 325 (65 FE) MG tablet Take  1 tablet (325 mg total) by mouth daily with breakfast. 30 tablet 3   gabapentin (NEURONTIN) 100 MG capsule Take 1 capsule (100 mg total) by mouth daily. 30 capsule 2   hydrOXYzine (ATARAX/VISTARIL) 25 MG tablet Take 1 tablet (25 mg total) by mouth at bedtime as needed for anxiety/insomnia 30 tablet 2   montelukast (SINGULAIR) 10 MG tablet TAKE 1 TABLET BY MOUTH ONCE DAILY (Patient taking differently: Take 10 mg by mouth daily.) 90 tablet 1   nadolol (CORGARD) 20 MG tablet Take 1 tablet (20 mg total) by mouth daily. 30 tablet 3   Olopatadine HCl 0.2 % SOLN Can use one drop in each eye once daily if needed. (Patient taking differently: Place 1 drop into both eyes daily as needed (allergies).) 7.5 mL 1   ondansetron (ZOFRAN-ODT) 8 MG disintegrating tablet DISSOLVE 1 TABLET BY MOUTH EVERY 8 HOURS AS NEEDED FOR NAUSEA OR VOMITING (Patient taking differently: Take 8 mg by mouth every 8 (eight) hours as needed for nausea or vomiting.) 60 tablet 3   pantoprazole (PROTONIX) 40 MG tablet Take 1 tablet (40 mg total) by mouth  2 (two) times daily before a meal. 60 tablet 3   Prenatal Vit-Fe Fumarate-FA (MULTIVITAMIN-PRENATAL) 27-0.8 MG TABS tablet Take 1 tablet by mouth 2 (two) times daily.     promethazine (PHENERGAN) 25 MG tablet Take 1 tablet by mouth every 6 hours as needed for vomiting or nausea. 120 tablet 0   VITAMIN D PO Take 1 capsule by mouth 2 (two) times daily.     VITAMIN E PO Take 1 capsule by mouth daily.     ARIPiprazole (ABILIFY) 10 MG tablet TAKE 1 TABLET BY MOUTH DAILY (Patient taking differently: Take 10 mg by mouth daily.) 30 tablet 1   fluconazole (DIFLUCAN) 100 MG tablet Take 1 tablet (100 mg total) by mouth daily. Take 1 additional tablet in one week if symptoms not fully resolved 2 tablet 0   HYDROcodone-acetaminophen (NORCO/VICODIN) 5-325 MG tablet Take 1 tablet by mouth every 12 (twelve) hours as needed for moderate pain or severe pain. 60 tablet 0   ipratropium-albuterol (DUONEB)  0.5-2.5 (3) MG/3ML SOLN INHALE 1 VIAL BY NEBULIZATION EVERY 6 HOURS AS NEEDED (Patient not taking: Reported on 03/15/2021) 360 mL 2   lamoTRIgine (LAMICTAL) 100 MG tablet 1 TAB BY MOUTH EVERY NIGHT AT BEDTIME (TOTAL DOSE 300MG BEGIN IN 2 WEEKS)IF OUT FOR MORE THAN 1 WEEK DO NOT RESTART OR IF RASH OCCURS CALL OFFICE (Patient taking differently: Take 100 mg by mouth daily.) 30 tablet 0   lamoTRIgine (LAMICTAL) 25 MG tablet Take 1 tablet by mouth once a day for 2 weeks, then take 2 tablets by mouth daily for 2 weeks. (Patient not taking: No sig reported) 42 tablet 0   Facility-Administered Medications Prior to Visit  Medication Dose Route Frequency Provider Last Rate Last Admin   promethazine (PHENERGAN) tablet 12.5-25 mg  12.5-25 mg Oral Q4H PRN Clovis Riley, MD       Or   promethazine (PHENERGAN) suppository 12.5-25 mg  12.5-25 mg Rectal Q4H PRN Romana Juniper A, MD       sodium chloride 0.9 % 1,000 mL with thiamine 025 mg, folic acid 1 mg, multivitamins adult 10 mL infusion   Intravenous Continuous Clovis Riley, MD       Final Medications at End of Visit    Current Meds  Medication Sig   albuterol (PROVENTIL) (2.5 MG/3ML) 0.083% nebulizer solution Take 3 mLs (2.5 mg total) by nebulization every 4 (four) hours as needed for wheezing or shortness of breath.   albuterol (VENTOLIN HFA) 108 (90 Base) MCG/ACT inhaler INHALE 2 PUFFS BY MOUTH INTO THE LUNGS EVERY 4 TO 6 HOURS AS NEEDED FOR COUGH OR WHEEZE (Patient taking differently: Inhale 2 puffs into the lungs every 4 (four) hours as needed for shortness of breath or wheezing.)   Ascorbic Acid (VITAMIN C) 1000 MG tablet Take 2,000 mg by mouth 2 (two) times daily.   Beclomethasone Dipropionate (QNASL) 80 MCG/ACT AERS INSTILL 1 SPRAY IN EACH NOSTRIL ONCE DAILY AS DIRECTED (Patient taking differently: Place 1 spray into the nose 2 (two) times daily.)   CALCIUM PO Take 1 tablet by mouth 2 (two) times daily.   cephALEXin (KEFLEX) 500 MG capsule  Take 1 capsule (500 mg total) by mouth 2 (two) times daily.   Cetirizine HCl 10 MG CAPS Take 10 mg by mouth 2 (two) times daily.    diphenhydrAMINE (BENADRYL) 25 MG tablet Take 50 mg by mouth at bedtime as needed for allergies or sleep.   escitalopram (LEXAPRO) 10 MG tablet Take 1/2 tablet  by mouth daily for 2 weeks then take 1 tablet daily   famotidine (PEPCID) 40 MG tablet Take 1 tablet by mouth at bedtime.   ferrous sulfate 325 (65 FE) MG tablet Take 1 tablet (325 mg total) by mouth daily with breakfast.   gabapentin (NEURONTIN) 100 MG capsule Take 1 capsule (100 mg total) by mouth daily.   hydrOXYzine (ATARAX/VISTARIL) 25 MG tablet Take 1 tablet (25 mg total) by mouth at bedtime as needed for anxiety/insomnia   montelukast (SINGULAIR) 10 MG tablet TAKE 1 TABLET BY MOUTH ONCE DAILY (Patient taking differently: Take 10 mg by mouth daily.)   nadolol (CORGARD) 20 MG tablet Take 1 tablet (20 mg total) by mouth daily.   Olopatadine HCl 0.2 % SOLN Can use one drop in each eye once daily if needed. (Patient taking differently: Place 1 drop into both eyes daily as needed (allergies).)   ondansetron (ZOFRAN-ODT) 8 MG disintegrating tablet DISSOLVE 1 TABLET BY MOUTH EVERY 8 HOURS AS NEEDED FOR NAUSEA OR VOMITING (Patient taking differently: Take 8 mg by mouth every 8 (eight) hours as needed for nausea or vomiting.)   pantoprazole (PROTONIX) 40 MG tablet Take 1 tablet (40 mg total) by mouth 2 (two) times daily before a meal.   Prenatal Vit-Fe Fumarate-FA (MULTIVITAMIN-PRENATAL) 27-0.8 MG TABS tablet Take 1 tablet by mouth 2 (two) times daily.   promethazine (PHENERGAN) 25 MG tablet Take 1 tablet by mouth every 6 hours as needed for vomiting or nausea.   verapamil (CALAN-SR) 120 MG CR tablet Take 1 tablet (120 mg total) by mouth at bedtime.   VITAMIN D PO Take 1 capsule by mouth 2 (two) times daily.   VITAMIN E PO Take 1 capsule by mouth daily.   Radiology:   No results found.  Cardiac Studies:    Echocardiogram 07/21/2017: Left ventricle cavity is normal in size. Mild concentric hypertrophy of the left ventricle. Normal global wall motion. Visual EF is 60-65%. Normal diastolic filling pattern. No significant valvular abnormality No evidence of pulmonary hypertension. No significant change compared to prior study dated 04/12/2015.   Event Monitor 30 days 07/07/2017: NSR. Symptoms of fatigue and palpitations: NSR. No other significant arrhythmias noted.  EKG:   03/26/2021: Sinus rhythm at a rate of 99 bpm.  Right axis.  LVH.  Nonspecific T wave abnormality.  03/17/2019: Normal sinus rhythm at 88 bpm, normal axis, no evidence of ischemia.   Assessment     ICD-10-CM   1. Tachycardia  R00.0 EKG 12-Lead    PCV ECHOCARDIOGRAM COMPLETE    verapamil (CALAN-SR) 120 MG CR tablet    2. Morbid obesity ([place])  E66.01     3. Right axis deviation  R94.31 PCV ECHOCARDIOGRAM COMPLETE       Medications Discontinued During This Encounter  Medication Reason   ARIPiprazole (ABILIFY) 10 MG tablet Error   fluconazole (DIFLUCAN) 100 MG tablet Error   HYDROcodone-acetaminophen (NORCO/VICODIN) 5-325 MG tablet Error   ipratropium-albuterol (DUONEB) 0.5-2.5 (3) MG/3ML SOLN Error   lamoTRIgine (LAMICTAL) 100 MG tablet Error   lamoTRIgine (LAMICTAL) 25 MG tablet Error    Meds ordered this encounter  Medications   verapamil (CALAN-SR) 120 MG CR tablet    Sig: Take 1 tablet (120 mg total) by mouth at bedtime.    Dispense:  30 tablet    Refill:  3    Recommendations:   Linda Becker is a 30 y.o. Caucasian female with history of migraines, liver cirrhosis secondary to NASH/portal hypertension, thrombocytopenia, GI  bleed in 01/2020 secondary to esophageal varices, splenomegaly, chronic anemia, hypothyroidism, and chronic palpitations.  She was last seen in our office 03/17/2019 by Binnie Kand, NP, for preoperative cardiac evaluation and elevated heart rate.  Patient had gastric sleeve  procedure done in January/2021 unfortunately she has gained approximately 30 pounds since then.  She is currently being evaluated by [facility] for liver transplant.  Patient has a history of sinus tachycardia with previously unremarkable cardiovascular work-up.  However today EKG reveals new right axis deviation, and she has had more frequent episodes of heart rate as high as 130 bpm.  We will therefore obtain echocardiogram.  Discussed with patient option of repeating cardiac monitor versus empirically treating with calcium channel blocker.  Shared decision was to proceed with initiation of verapamil 120 mg daily and monitor closely.  Could consider repeat monitor if symptoms worsen or fail to improve.  Counseled patient regarding signs and symptoms that would warrant urgent or emergent evaluation and advised her regarding side effect profile of verapamil.  Given that patient's blood pressure is on the soft side advised her to monitor blood pressure closely with initiation of verapamil, she verbalized understanding agreement.  Will defer management of anemia and thyroid dysfunction to PCP and advised patient to continue to follow closely with GI for management of other comorbidities.  Suspect patient's sinus tachycardia to be related to obesity as well as anemia.  Counseled patient regarding diet and lifestyle modifications and weight loss.  Follow-up in 4 weeks, sooner if needed, for results of echocardiogram and tachycardia.   Alethia Berthold, PA-C 03/26/2021, 10:27 AM Office: [phone]

## 2021-03-27 ENCOUNTER — Other Ambulatory Visit (HOSPITAL_COMMUNITY): Payer: Self-pay

## 2021-03-28 ENCOUNTER — Other Ambulatory Visit (HOSPITAL_COMMUNITY): Payer: Self-pay

## 2021-04-01 ENCOUNTER — Other Ambulatory Visit (HOSPITAL_COMMUNITY): Payer: Self-pay

## 2021-04-01 ENCOUNTER — Other Ambulatory Visit: Payer: 59

## 2021-04-01 MED ORDER — URSODIOL 300 MG PO CAPS
300.0000 mg | ORAL_CAPSULE | Freq: Two times a day (BID) | ORAL | 11 refills | Status: DC
  Filled 2021-04-01 – 2021-08-09 (×2): qty 60, 30d supply, fill #0

## 2021-04-02 ENCOUNTER — Other Ambulatory Visit (HOSPITAL_COMMUNITY): Payer: Self-pay

## 2021-04-04 ENCOUNTER — Ambulatory Visit: Payer: Medicaid Other | Admitting: Physician Assistant

## 2021-04-04 ENCOUNTER — Other Ambulatory Visit: Payer: Self-pay

## 2021-04-04 ENCOUNTER — Ambulatory Visit: Payer: 59

## 2021-04-04 DIAGNOSIS — R000 Tachycardia, unspecified: Secondary | ICD-10-CM

## 2021-04-04 DIAGNOSIS — R9431 Abnormal electrocardiogram [ECG] [EKG]: Secondary | ICD-10-CM

## 2021-04-09 ENCOUNTER — Other Ambulatory Visit: Payer: Self-pay | Admitting: Allergy

## 2021-04-09 ENCOUNTER — Other Ambulatory Visit (HOSPITAL_COMMUNITY): Payer: Self-pay

## 2021-04-10 ENCOUNTER — Other Ambulatory Visit: Payer: Self-pay

## 2021-04-10 ENCOUNTER — Other Ambulatory Visit (HOSPITAL_COMMUNITY): Payer: Self-pay

## 2021-04-10 DIAGNOSIS — K7460 Unspecified cirrhosis of liver: Secondary | ICD-10-CM

## 2021-04-10 NOTE — Telephone Encounter (Signed)
Patient needs to be scheduled for outpatient upper endoscopy in the hospital setting.  This can be performed at [facility] in an outpatient block by either me or another provider (if okay with the patient).  This will be for variceal screening but also to follow-up possible GAVE. She is following with [facility] hepatology and they have recommended repeat upper endoscopy at this time. History of esophageal varices and prior band ligation.

## 2021-04-16 ENCOUNTER — Other Ambulatory Visit (HOSPITAL_COMMUNITY): Payer: Self-pay

## 2021-04-16 ENCOUNTER — Other Ambulatory Visit: Payer: Self-pay | Admitting: Internal Medicine

## 2021-04-16 MED ORDER — PROMETHAZINE HCL 25 MG PO TABS
25.0000 mg | ORAL_TABLET | Freq: Four times a day (QID) | ORAL | 0 refills | Status: DC | PRN
  Filled 2021-04-16: qty 120, 30d supply, fill #0

## 2021-04-16 MED FILL — Ondansetron Orally Disintegrating Tab 8 MG: ORAL | 20 days supply | Qty: 60 | Fill #2 | Status: CN

## 2021-04-16 NOTE — Telephone Encounter (Signed)
Please refer patient to [facility] pain management

## 2021-04-17 ENCOUNTER — Other Ambulatory Visit (HOSPITAL_COMMUNITY): Payer: Self-pay

## 2021-04-18 ENCOUNTER — Other Ambulatory Visit (HOSPITAL_COMMUNITY): Payer: Self-pay

## 2021-04-18 ENCOUNTER — Encounter (HOSPITAL_COMMUNITY): Payer: Self-pay | Admitting: Internal Medicine

## 2021-04-18 NOTE — Progress Notes (Signed)
Attempted to obtain medical history via telephone, unable to reach at this time. I left a voicemail to return pre surgical testing department's phone call.  

## 2021-04-19 ENCOUNTER — Other Ambulatory Visit (HOSPITAL_COMMUNITY): Payer: Self-pay

## 2021-04-22 ENCOUNTER — Telehealth: Payer: Self-pay

## 2021-04-22 NOTE — Telephone Encounter (Signed)
Referral and records faxed to [phone] to Dr. Milinda Pointer for an appt for pain clinic.

## 2021-04-23 ENCOUNTER — Other Ambulatory Visit (HOSPITAL_COMMUNITY): Payer: Self-pay

## 2021-04-23 NOTE — Progress Notes (Signed)
Primary Physician/Referring:  Deon Pilling, NP  Patient ID: Linda Becker, female    DOB: 01-10-1991, 30 y.o.   MRN: [id number]  Chief Complaint  Patient presents with   Tachycardia   Follow-up   HPI:    Linda Becker  is a 30 y.o. Caucasian female with history of migraines, liver cirrhosis secondary to NASH/portal hypertension, thrombocytopenia, hypothyroidism, and chronic palpitations.  She was last seen in our office 03/17/2019 by Binnie Kand, NP, for preoperative cardiac evaluation and elevated heart rate.  Patient did have gastric sleeve procedure done in January 2021, unfortunately she has gained approximately 30 pounds since then.  Patient had GI bleed in 01/2020 secondary to esophageal varices.  Due to underlying cirrhosis patient has splenomegaly and thrombocytopenia as well as chronic anemia all of which are presently managed by GI.  She is currently being evaluated by [facility] for liver transplant.  Patient was seen in our office 03/26/2021 with concerns of tachycardia.  Given episodes of elevated heart rate and new right axis deviation and EKG ordered echocardiogram which noted normal LVEF at 60% with grade 1 diastolic dysfunction mild valvular disease, as well as mild LVH.  Also last office visit started patient on from verapamil 120 mg daily as shared decision was to hold off on repeat cardiac monitor at this time.  Notably patient also has anemia and thyroid dysfunction, for which she is following with PCP.   Patient reports that since starting verapamil she has had less frequent episodes of palpitations, however continues to report elevated heart rate with maximum heart rate 110 bpm.  She is feeling well overall, however she has not followed up with PCP for management of thyroid dysfunction.  Patient denies chest pain, dizziness, syncope, near syncope.  She is presently taking nadolol for portal hypertension. Notably patient is not pregnant and does not plan to become  pregnant, she take prenatal vitamins for her hair and nails.   Past Medical History:  Diagnosis Date   Allergic rhinitis    remote hx of allergy shots   Asthma    since childhood  on controller meds extrinsic dr Carmelina Peal   Bipolar II disorder (HCC)    Cirrhosis ([place])    Esophageal varices ([place])    Family history of adverse reaction to anesthesia    mom and sister difficult to awaken per patient    Gallbladder sludge    Gastropathy    GERD (gastroesophageal reflux disease)    on nexium  for  long term sx since childhood   H/O miscarriage, not currently pregnant    [redacted] weeks  march 2016   Hepatic steatosis    Hiatal hernia    Hypothyroidism    Migraine    Murmur    pt reports MVP   Portal hypertension ([place])    Sessile colonic polyp    Splenomegaly    Steatohepatitis    Syncope    under eval ? cause   Tachycardia    episodes with near syncope eval dr Nadyne Coombes  on no meds dced LABA   Thrombocytopenia ([place])    Upper GI bleed    Past Surgical History:  Procedure Laterality Date   BIOPSY  09/03/2018   Procedure: BIOPSY;  Surgeon: Jerene Bears, MD;  Location: [facility];  Service: Gastroenterology;;   BIOPSY  09/26/2020   Procedure: BIOPSY;  Surgeon: Jerene Bears, MD;  Location: [facility];  Service: Gastroenterology;;   broken right femur  2008   rod  placed   COLONOSCOPY WITH PROPOFOL N/A 09/03/2018   Procedure: COLONOSCOPY WITH PROPOFOL;  Surgeon: Jerene Bears, MD;  Location: [facility];  Service: Gastroenterology;  Laterality: N/A;   DILATION AND CURETTAGE OF UTERUS     ESOPHAGOGASTRODUODENOSCOPY (EGD) WITH PROPOFOL N/A 09/03/2018   Procedure: ESOPHAGOGASTRODUODENOSCOPY (EGD) WITH PROPOFOL;  Surgeon: Jerene Bears, MD;  Location: [facility];  Service: Gastroenterology;  Laterality: N/A;   ESOPHAGOGASTRODUODENOSCOPY (EGD) WITH PROPOFOL N/A 01/27/2020   Procedure: ESOPHAGOGASTRODUODENOSCOPY (EGD) WITH PROPOFOL;  Surgeon: Rush Landmark Telford Nab., MD;  Location: [place];   Service: Gastroenterology;  Laterality: N/A;   ESOPHAGOGASTRODUODENOSCOPY (EGD) WITH PROPOFOL N/A 03/12/2020   Procedure: ESOPHAGOGASTRODUODENOSCOPY (EGD) WITH PROPOFOL;  Surgeon: Jerene Bears, MD;  Location: [facility];  Service: Gastroenterology;  Laterality: N/A;   ESOPHAGOGASTRODUODENOSCOPY (EGD) WITH PROPOFOL N/A 09/26/2020   Procedure: ESOPHAGOGASTRODUODENOSCOPY (EGD) WITH PROPOFOL;  Surgeon: Jerene Bears, MD;  Location: [facility];  Service: Gastroenterology;  Laterality: N/A;   GASTRIC VARICES BANDING  01/27/2020   Procedure: GASTRIC VARICES BANDING;  Surgeon: Rush Landmark Telford Nab., MD;  Location[facility];  Service: Gastroenterology;;   IR TRANSCATHETER BX  01/19/2019   IR US GUIDE VASC ACCESS RIGHT  01/19/2019   IR VENOGRAM HEPATIC W HEMODYNAMIC EVALUATION  01/19/2019   LAPAROSCOPIC GASTRIC SLEEVE RESECTION N/A 06/28/2019   Procedure: LAPAROSCOPIC GASTRIC SLEEVE RESECTION, Upper Endo, ERAS Pathway;  Surgeon: Clovis Riley, MD;  Location: [facility];  Service: General;  Laterality: N/A;   OB ultrasound N/A 12/01/2017   see report   POLYPECTOMY  09/03/2018   Procedure: POLYPECTOMY;  Surgeon: Jerene Bears, MD;  Location: [facility];  Service: Gastroenterology;;   TONSILLECTOMY  2006   WISDOM TOOTH EXTRACTION     Family History  Problem Relation Age of Onset   Liver disease Mother 47       advanced non-alcoholic cirrhosis   Arthritis Mother    Hyperlipidemia Mother    Heart disease Mother    Hypertension Mother    Diabetes Mother    Kidney disease Mother    Bleeding Disorder Mother    Colon polyps Mother    Asthma Father    Arthritis Father    Hyperlipidemia Father    Heart disease Father    Hypertension Father    Diabetes Father    Colon polyps Father    Breast cancer Maternal Aunt    Breast cancer Paternal Aunt    Esophageal cancer Paternal Aunt    Arthritis Maternal Grandmother    Breast cancer Maternal Grandmother    Thyroid disease Maternal Grandmother     Diabetes Maternal Grandfather    Pancreatic cancer Maternal Grandfather    Breast cancer Paternal Grandmother    Diabetes Paternal Grandmother    Colon cancer Paternal Grandfather    Diabetes Paternal Grandfather    Liver cancer Paternal Grandfather    Stomach cancer Paternal Grandfather     Social History   Tobacco Use   Smoking status: Never   Smokeless tobacco: Never  Substance Use Topics   Alcohol use: No    Alcohol/week: 0.0 standard drinks   Marital Status: Married   ROS  Review of Systems  Constitutional: Negative for malaise/fatigue and weight gain.  Cardiovascular:  Positive for palpitations. Negative for chest pain, claudication, leg swelling, near-syncope, orthopnea, paroxysmal nocturnal dyspnea and syncope.  Respiratory:  Positive for shortness of breath.   Neurological:  Negative for dizziness.   Objective  Blood pressure (!) 131/58, pulse 98, temperature 98 F (  36.7 C), height 5' 6.25" (1.683 m), weight 298 lb (135.2 kg), SpO2 98 %.  Vitals with BMI 04/24/2021 03/26/2021 03/13/2021  Height 5' 6.25" 5' 6.25" 5' 6.25"  Weight 298 lbs 305 lbs 294 lbs 2 oz  BMI 47.72 83.41 96.2  Systolic 229 798 921  Diastolic 58 58 70  Pulse 98 107 134  Some encounter information is confidential and restricted. Go to Review Flowsheets activity to see all data.   Orthostatic VS for the past 72 hrs (Last 3 readings):  Patient Position BP Location Cuff Size  04/24/21 0858 Sitting Left Arm Large    Physical Exam Vitals reviewed.  Constitutional:      Appearance: She is obese.  Cardiovascular:     Rate and Rhythm: Normal rate and regular rhythm.     Pulses: Intact distal pulses.     Heart sounds: S1 normal and S2 normal. No murmur heard.   No gallop.  Pulmonary:     Effort: Pulmonary effort is normal. No respiratory distress.     Breath sounds: No wheezing, rhonchi or rales.  Musculoskeletal:     Right lower leg: No edema.     Left lower leg: No edema.  Neurological:      Mental Status: She is alert.     Laboratory examination:   Recent Labs    01/20/21 1605 01/25/21 1136 03/15/21 1338  NA 144 141 136  K 4.4 4.6 4.1  CL 112* 112 105  CO2 23 22 23   GLUCOSE 102* 87 135*  BUN 9 7 11   CREATININE 0.82 0.79 0.95  CALCIUM 8.7* 8.6 7.9*  GFRNONAA >60  --  >60   CrCl cannot be calculated (Patient's most recent lab result is older than the maximum 21 days allowed.).  CMP Latest Ref Rng & Units 03/15/2021 01/25/2021 01/20/2021  Glucose 70 - 99 mg/dL 135(H) 87 102(H)  BUN 6 - 20 mg/dL 11 7 9   Creatinine 0.44 - 1.00 mg/dL 0.95 0.79 0.82  Sodium 135 - 145 mmol/L 136 141 144  Potassium 3.5 - 5.1 mmol/L 4.1 4.6 4.4  Chloride 98 - 111 mmol/L 105 112 112(H)  CO2 22 - 32 mmol/L 23 22 23   Calcium 8.9 - 10.3 mg/dL 7.9(L) 8.6 8.7(L)  Total Protein 6.5 - 8.1 g/dL 6.1(L) 6.5 6.7  Total Bilirubin 0.3 - 1.2 mg/dL 0.6 0.3 0.4  Alkaline Phos 38 - 126 U/L 75 76 93  AST 15 - 41 U/L 21 28 31   ALT 0 - 44 U/L 16 22 38   CBC Latest Ref Rng & Units 03/15/2021 01/25/2021 01/20/2021  WBC 4.0 - 10.5 K/uL 5.8 3.7(L) 3.4(L)  Hemoglobin 12.0 - 15.0 g/dL 9.1(L) 10.8(L) 10.0(L)  Hematocrit 36.0 - 46.0 % 31.3(L) 33.9(L) 32.9(L)  Platelets 150 - 400 K/uL 87(L) 96.0(L) 77(L)    Lipid Panel No results for input(s): CHOL, TRIG, LDLCALC, VLDL, HDL, CHOLHDL, LDLDIRECT in the last 8760 hours.  HEMOGLOBIN A1C Lab Results  Component Value Date   HGBA1C 5.2 03/11/2019   TSH Recent Labs    05/01/20 1605  TSH 0.72   External labs:  03/18/2021: Hgb 9.6, platelet 95 Sodium 139, potassium 4.9, BUN 9, creatinine 1.11, ALT 21, AST 20 A1c 5.2% TSH 6.07  Allergies   Allergies  Allergen Reactions   Progesterone Rash    Was in a form of birth control.   Penicillins Rash    Did it involve swelling of the face/tongue/throat, SOB, or low BP? No Did it involve sudden or  severe rash/hives, skin peeling, or any reaction on the inside of your mouth or nose? Yes Did you need to seek medical  attention at a hospital or doctor's office? No When did it last happen?      9 + months If all above answers are "NO", may proceed with cephalosporin use.      Medications Prior to Visit:   Outpatient Medications Prior to Visit  Medication Sig Dispense Refill   albuterol (PROVENTIL) (2.5 MG/3ML) 0.083% nebulizer solution Take 3 mLs (2.5 mg total) by nebulization every 4 (four) hours as needed for wheezing or shortness of breath.     albuterol (VENTOLIN HFA) 108 (90 Base) MCG/ACT inhaler INHALE 2 PUFFS BY MOUTH INTO THE LUNGS EVERY 4 TO 6 HOURS AS NEEDED FOR COUGH OR WHEEZE (Patient taking differently: Inhale 2 puffs into the lungs every 4 (four) hours as needed for shortness of breath or wheezing.) 8.5 g 1   Ascorbic Acid (VITAMIN C) 1000 MG tablet Take 2,000 mg by mouth 2 (two) times daily.     Beclomethasone Dipropionate (QNASL) 80 MCG/ACT AERS INSTILL 1 SPRAY IN EACH NOSTRIL ONCE DAILY AS DIRECTED (Patient taking differently: Place 1 spray into the nose 2 (two) times daily.) 31.8 g 1   CALCIUM PO Take 1 tablet by mouth 2 (two) times daily.     Cetirizine HCl 10 MG CAPS Take 10 mg by mouth 2 (two) times daily.      diphenhydrAMINE (BENADRYL) 25 MG tablet Take 50 mg by mouth at bedtime as needed for allergies or sleep.     escitalopram (LEXAPRO) 10 MG tablet Take 1/2 tablet by mouth daily for 2 weeks then take 1 tablet daily (Patient taking differently: Take 10 mg by mouth daily.) 30 tablet 2   famotidine (PEPCID) 40 MG tablet Take 1 tablet by mouth at bedtime. 30 tablet 1   ferrous sulfate 325 (65 FE) MG tablet Take 1 tablet (325 mg total) by mouth daily with breakfast. 30 tablet 3   gabapentin (NEURONTIN) 100 MG capsule Take 1 capsule (100 mg total) by mouth daily. 30 capsule 2   hydrOXYzine (ATARAX/VISTARIL) 25 MG tablet Take 1 tablet (25 mg total) by mouth at bedtime as needed for anxiety/insomnia 30 tablet 2   montelukast (SINGULAIR) 10 MG tablet TAKE 1 TABLET BY MOUTH ONCE DAILY (Patient  taking differently: Take 10 mg by mouth daily.) 90 tablet 1   nadolol (CORGARD) 20 MG tablet Take 1 tablet (20 mg total) by mouth daily. 30 tablet 3   Olopatadine HCl 0.2 % SOLN Can use one drop in each eye once daily if needed. (Patient taking differently: Place 1 drop into both eyes daily as needed (allergies).) 7.5 mL 1   ondansetron (ZOFRAN-ODT) 8 MG disintegrating tablet DISSOLVE 1 TABLET BY MOUTH EVERY 8 HOURS AS NEEDED FOR NAUSEA OR VOMITING (Patient taking differently: Take 8 mg by mouth every 8 (eight) hours as needed for nausea or vomiting.) 60 tablet 3   pantoprazole (PROTONIX) 40 MG tablet Take 1 tablet (40 mg total) by mouth 2 (two) times daily before a meal. 60 tablet 3   Prenatal Vit-Fe Fumarate-FA (MULTIVITAMIN-PRENATAL) 27-0.8 MG TABS tablet Take 1 tablet by mouth 2 (two) times daily.     promethazine (PHENERGAN) 25 MG tablet Take 1 tablet by mouth every 6 hours as needed for vomiting or nausea. 120 tablet 0   ursodiol (ACTIGALL) 300 MG capsule Take 1 capsule (300 mg total) by mouth 2 (two) times daily 60 capsule  11   VITAMIN D PO Take 1 capsule by mouth 2 (two) times daily.     VITAMIN E PO Take 1 capsule by mouth daily.     verapamil (CALAN-SR) 120 MG CR tablet Take 1 tablet (120 mg total) by mouth at bedtime. 30 tablet 3   cephALEXin (KEFLEX) 500 MG capsule Take 1 capsule (500 mg total) by mouth 2 (two) times daily. 20 capsule 0   Facility-Administered Medications Prior to Visit  Medication Dose Route Frequency Provider Last Rate Last Admin   promethazine (PHENERGAN) tablet 12.5-25 mg  12.5-25 mg Oral Q4H PRN Clovis Riley, MD       Or   promethazine (PHENERGAN) suppository 12.5-25 mg  12.5-25 mg Rectal Q4H PRN Romana Juniper A, MD       sodium chloride 0.9 % 1,000 mL with thiamine 431 mg, folic acid 1 mg, multivitamins adult 10 mL infusion   Intravenous Continuous Clovis Riley, MD       Final Medications at End of Visit    Current Meds  Medication Sig    albuterol (PROVENTIL) (2.5 MG/3ML) 0.083% nebulizer solution Take 3 mLs (2.5 mg total) by nebulization every 4 (four) hours as needed for wheezing or shortness of breath.   albuterol (VENTOLIN HFA) 108 (90 Base) MCG/ACT inhaler INHALE 2 PUFFS BY MOUTH INTO THE LUNGS EVERY 4 TO 6 HOURS AS NEEDED FOR COUGH OR WHEEZE (Patient taking differently: Inhale 2 puffs into the lungs every 4 (four) hours as needed for shortness of breath or wheezing.)   Ascorbic Acid (VITAMIN C) 1000 MG tablet Take 2,000 mg by mouth 2 (two) times daily.   Beclomethasone Dipropionate (QNASL) 80 MCG/ACT AERS INSTILL 1 SPRAY IN EACH NOSTRIL ONCE DAILY AS DIRECTED (Patient taking differently: Place 1 spray into the nose 2 (two) times daily.)   CALCIUM PO Take 1 tablet by mouth 2 (two) times daily.   Cetirizine HCl 10 MG CAPS Take 10 mg by mouth 2 (two) times daily.    diphenhydrAMINE (BENADRYL) 25 MG tablet Take 50 mg by mouth at bedtime as needed for allergies or sleep.   escitalopram (LEXAPRO) 10 MG tablet Take 1/2 tablet by mouth daily for 2 weeks then take 1 tablet daily (Patient taking differently: Take 10 mg by mouth daily.)   famotidine (PEPCID) 40 MG tablet Take 1 tablet by mouth at bedtime.   ferrous sulfate 325 (65 FE) MG tablet Take 1 tablet (325 mg total) by mouth daily with breakfast.   gabapentin (NEURONTIN) 100 MG capsule Take 1 capsule (100 mg total) by mouth daily.   hydrOXYzine (ATARAX/VISTARIL) 25 MG tablet Take 1 tablet (25 mg total) by mouth at bedtime as needed for anxiety/insomnia   montelukast (SINGULAIR) 10 MG tablet TAKE 1 TABLET BY MOUTH ONCE DAILY (Patient taking differently: Take 10 mg by mouth daily.)   nadolol (CORGARD) 20 MG tablet Take 1 tablet (20 mg total) by mouth daily.   Olopatadine HCl 0.2 % SOLN Can use one drop in each eye once daily if needed. (Patient taking differently: Place 1 drop into both eyes daily as needed (allergies).)   ondansetron (ZOFRAN-ODT) 8 MG disintegrating tablet DISSOLVE 1  TABLET BY MOUTH EVERY 8 HOURS AS NEEDED FOR NAUSEA OR VOMITING (Patient taking differently: Take 8 mg by mouth every 8 (eight) hours as needed for nausea or vomiting.)   pantoprazole (PROTONIX) 40 MG tablet Take 1 tablet (40 mg total) by mouth 2 (two) times daily before a meal.   Prenatal  Vit-Fe Fumarate-FA (MULTIVITAMIN-PRENATAL) 27-0.8 MG TABS tablet Take 1 tablet by mouth 2 (two) times daily.   promethazine (PHENERGAN) 25 MG tablet Take 1 tablet by mouth every 6 hours as needed for vomiting or nausea.   ursodiol (ACTIGALL) 300 MG capsule Take 1 capsule (300 mg total) by mouth 2 (two) times daily   VITAMIN D PO Take 1 capsule by mouth 2 (two) times daily.   VITAMIN E PO Take 1 capsule by mouth daily.   [DISCONTINUED] verapamil (CALAN-SR) 120 MG CR tablet Take 1 tablet (120 mg total) by mouth at bedtime.   Radiology:   No results found.  Cardiac Studies:   Echocardiogram 07/21/2017: Left ventricle cavity is normal in size. Mild concentric hypertrophy of the left ventricle. Normal global wall motion. Visual EF is 60-65%. Normal diastolic filling pattern. No significant valvular abnormality No evidence of pulmonary hypertension. No significant change compared to prior study dated 04/12/2015.   Event Monitor 30 days 07/07/2017: NSR. Symptoms of fatigue and palpitations: NSR. No other significant arrhythmias noted.  PCV ECHOCARDIOGRAM COMPLETE 04/04/2021 Left ventricle cavity is normal in size. Mild concentric hypertrophy of the left ventricle. Normal global wall motion. Normal LV systolic function with EF 60%. Doppler evidence of grade I (impaired) diastolic dysfunction, normal LAP. Mild (Grade I) mitral regurgitation. Mild tricuspid regurgitation. No evidence of pulmonary hypertension. Compared to previous study in 2019, mild MR, mild TR are new.   EKG:   03/26/2021: Sinus rhythm at a rate of 99 bpm.  Right axis.  LVH.  Nonspecific T wave abnormality.  03/17/2019: Normal sinus rhythm  at 88 bpm, normal axis, no evidence of ischemia.   Assessment     ICD-10-CM   1. Tachycardia  R00.0 LONG TERM MONITOR (3-14 DAYS)    verapamil (CALAN-SR) 180 MG CR tablet    2. Palpitations  R00.2 LONG TERM MONITOR (3-14 DAYS)       Medications Discontinued During This Encounter  Medication Reason   cephALEXin (KEFLEX) 500 MG capsule Error   verapamil (CALAN-SR) 120 MG CR tablet     Meds ordered this encounter  Medications   verapamil (CALAN-SR) 180 MG CR tablet    Sig: Take 1 tablet (180 mg total) by mouth at bedtime.    Dispense:  30 tablet    Refill:  3    Recommendations:   Linda Becker is a 30 y.o. Caucasian female with history of migraines, liver cirrhosis secondary to NASH/portal hypertension, thrombocytopenia, GI bleed in 01/2020 secondary to esophageal varices, splenomegaly, chronic anemia, hypothyroidism, and chronic palpitations.  She was last seen in our office 03/17/2019 by Binnie Kand, NP, for preoperative cardiac evaluation and elevated heart rate.  Patient had gastric sleeve procedure done in January/2021 unfortunately she has gained approximately 30 pounds since then.  She is currently being evaluated by [facility] for liver transplant.  Patient was seen in our office 03/26/2021 with concerns of tachycardia.  Given episodes of elevated heart rate and new right axis deviation and EKG ordered echocardiogram which noted normal LVEF at 60% with grade 1 diastolic dysfunction mild valvular disease, as well as mild LVH.  Also last office visit started patient on from Aptamil 120 mg daily as shared decision was to hold off on repeat cardiac monitor at this time.  Notably patient also has anemia and thyroid dysfunction, for which she is following with PCP.  Reviewed and discussed with patient results of echocardiogram, details above.  Echocardiogram is essentially normal.  Advised patient regarding the importance  of following up with PCP for management of thyroid dysfunction and  anemia, both of which may be contributing to patient's palpitations and tachycardia.  As patient continues to have episodes of palpitations will increase verapamil from 120 mg to 180 mg daily.  We will also obtain 3-day cardiac monitor to evaluate for underlying cardiac arrhythmias.  Follow-up in 3 months, sooner if needed, for palpitations and tachycardia.   Alethia Berthold, PA-C 04/24/2021, 12:27 PM Office: [phone] 04/24/2021, 12:27 PM Office: [phone]

## 2021-04-24 ENCOUNTER — Other Ambulatory Visit: Payer: Self-pay

## 2021-04-24 ENCOUNTER — Ambulatory Visit: Payer: 59 | Admitting: Student

## 2021-04-24 ENCOUNTER — Other Ambulatory Visit (HOSPITAL_COMMUNITY): Payer: Self-pay

## 2021-04-24 ENCOUNTER — Encounter: Payer: Self-pay | Admitting: Student

## 2021-04-24 VITALS — BP 131/58 | HR 98 | Temp 98.0°F | Ht 66.25 in | Wt 298.0 lb

## 2021-04-24 DIAGNOSIS — R002 Palpitations: Secondary | ICD-10-CM

## 2021-04-24 DIAGNOSIS — R000 Tachycardia, unspecified: Secondary | ICD-10-CM

## 2021-04-24 MED ORDER — VERAPAMIL HCL ER 180 MG PO TBCR
180.0000 mg | EXTENDED_RELEASE_TABLET | Freq: Every day | ORAL | 3 refills | Status: DC
  Filled 2021-04-24: qty 30, 30d supply, fill #0
  Filled 2021-06-18 – 2021-06-24 (×2): qty 30, 30d supply, fill #1
  Filled 2021-07-19: qty 30, 30d supply, fill #2
  Filled 2021-07-19: qty 30, 30d supply, fill #0

## 2021-04-25 ENCOUNTER — Other Ambulatory Visit (HOSPITAL_COMMUNITY): Payer: Self-pay

## 2021-04-29 ENCOUNTER — Encounter (HOSPITAL_COMMUNITY): Payer: Self-pay | Admitting: Internal Medicine

## 2021-04-29 ENCOUNTER — Other Ambulatory Visit (HOSPITAL_COMMUNITY): Payer: Self-pay

## 2021-04-29 ENCOUNTER — Other Ambulatory Visit: Payer: Self-pay

## 2021-04-29 ENCOUNTER — Ambulatory Visit (HOSPITAL_COMMUNITY)
Admission: RE | Admit: 2021-04-29 | Discharge: 2021-04-29 | Disposition: A | Discharge status: Home / Self Care | Payer: 59 | Attending: Internal Medicine | Admitting: Internal Medicine

## 2021-04-29 ENCOUNTER — Ambulatory Visit (HOSPITAL_COMMUNITY): Payer: 59 | Admitting: Certified Registered Nurse Anesthetist

## 2021-04-29 ENCOUNTER — Telehealth: Payer: Self-pay | Admitting: Allergy and Immunology

## 2021-04-29 ENCOUNTER — Encounter (HOSPITAL_COMMUNITY)
Admission: RE | Disposition: A | Discharge status: Home / Self Care | Payer: Self-pay | Source: Home / Self Care | Attending: Internal Medicine

## 2021-04-29 DIAGNOSIS — K219 Gastro-esophageal reflux disease without esophagitis: Secondary | ICD-10-CM | POA: Insufficient documentation

## 2021-04-29 DIAGNOSIS — K766 Portal hypertension: Secondary | ICD-10-CM | POA: Diagnosis not present

## 2021-04-29 DIAGNOSIS — K7581 Nonalcoholic steatohepatitis (NASH): Secondary | ICD-10-CM

## 2021-04-29 DIAGNOSIS — K449 Diaphragmatic hernia without obstruction or gangrene: Secondary | ICD-10-CM | POA: Insufficient documentation

## 2021-04-29 DIAGNOSIS — K7460 Unspecified cirrhosis of liver: Secondary | ICD-10-CM | POA: Diagnosis not present

## 2021-04-29 DIAGNOSIS — J45909 Unspecified asthma, uncomplicated: Secondary | ICD-10-CM | POA: Insufficient documentation

## 2021-04-29 DIAGNOSIS — K31819 Angiodysplasia of stomach and duodenum without bleeding: Secondary | ICD-10-CM | POA: Diagnosis not present

## 2021-04-29 DIAGNOSIS — Z9884 Bariatric surgery status: Secondary | ICD-10-CM | POA: Insufficient documentation

## 2021-04-29 DIAGNOSIS — F3181 Bipolar II disorder: Secondary | ICD-10-CM | POA: Diagnosis not present

## 2021-04-29 DIAGNOSIS — D649 Anemia, unspecified: Secondary | ICD-10-CM | POA: Diagnosis present

## 2021-04-29 DIAGNOSIS — D509 Iron deficiency anemia, unspecified: Secondary | ICD-10-CM

## 2021-04-29 DIAGNOSIS — I8510 Secondary esophageal varices without bleeding: Secondary | ICD-10-CM | POA: Diagnosis not present

## 2021-04-29 HISTORY — PX: HOT HEMOSTASIS: SHX5433

## 2021-04-29 HISTORY — PX: ESOPHAGOGASTRODUODENOSCOPY (EGD) WITH PROPOFOL: SHX5813

## 2021-04-29 LAB — PREGNANCY, URINE: Preg Test, Ur: NEGATIVE

## 2021-04-29 SURGERY — ESOPHAGOGASTRODUODENOSCOPY (EGD) WITH PROPOFOL
Anesthesia: Monitor Anesthesia Care

## 2021-04-29 MED ORDER — LIDOCAINE 2% (20 MG/ML) 5 ML SYRINGE
INTRAMUSCULAR | Status: DC | PRN
  Administered 2021-04-29: 100 mg via INTRAVENOUS

## 2021-04-29 MED ORDER — GLYCOPYRROLATE PF 0.2 MG/ML IJ SOSY
PREFILLED_SYRINGE | INTRAMUSCULAR | Status: DC | PRN
  Administered 2021-04-29: .2 mg via INTRAVENOUS

## 2021-04-29 MED ORDER — ALBUTEROL SULFATE HFA 108 (90 BASE) MCG/ACT IN AERS
INHALATION_SPRAY | RESPIRATORY_TRACT | 0 refills | Status: DC
  Filled 2021-04-29: qty 18, 25d supply, fill #0

## 2021-04-29 MED ORDER — ONDANSETRON HCL 4 MG/2ML IJ SOLN
INTRAMUSCULAR | Status: DC | PRN
  Administered 2021-04-29: 4 mg via INTRAVENOUS

## 2021-04-29 MED ORDER — ALBUTEROL SULFATE (2.5 MG/3ML) 0.083% IN NEBU: 2.5000 mg | INHALATION_SOLUTION | RESPIRATORY_TRACT | 0 refills | Status: DC | PRN

## 2021-04-29 MED ORDER — LACTATED RINGERS IV SOLN: INTRAVENOUS | Status: DC

## 2021-04-29 MED ORDER — SUCRALFATE 1 GM/10ML PO SUSP
1.0000 g | Freq: Three times a day (TID) | ORAL | 1 refills | Status: DC
Start: 2021-04-29 — End: 2022-05-15
  Filled 2021-04-29: qty 420, 11d supply, fill #0
  Filled 2021-05-20: qty 420, 10d supply, fill #0
  Filled 2021-06-24 – 2022-01-17 (×4): qty 420, 11d supply, fill #0

## 2021-04-29 MED ORDER — PROPOFOL 10 MG/ML IV BOLUS
INTRAVENOUS | Status: DC | PRN
  Administered 2021-04-29: 20 mg via INTRAVENOUS
  Administered 2021-04-29: 30 mg via INTRAVENOUS

## 2021-04-29 MED ORDER — PROPOFOL 1000 MG/100ML IV EMUL
INTRAVENOUS | Status: AC
  Filled 2021-04-29: qty 100

## 2021-04-29 MED ORDER — PROPOFOL 500 MG/50ML IV EMUL
INTRAVENOUS | Status: DC | PRN
  Administered 2021-04-29: 135 ug/kg/min via INTRAVENOUS

## 2021-04-29 MED ORDER — SODIUM CHLORIDE 0.9 % IV SOLN: INTRAVENOUS | Status: DC

## 2021-04-29 SURGICAL SUPPLY — 14 items

## 2021-04-29 NOTE — Anesthesia Procedure Notes (Signed)
Procedure Name: MAC Date/Time: 04/29/2021 9:06 AM Performed by: West Pugh, CRNA Pre-anesthesia Checklist: Patient identified, Emergency Drugs available, Suction available, Patient being monitored and Timeout performed Patient Re-evaluated:Patient Re-evaluated prior to induction Oxygen Delivery Method: Simple face mask Placement Confirmation: positive ETCO2 Dental Injury: Teeth and Oropharynx as per pre-operative assessment

## 2021-04-29 NOTE — Telephone Encounter (Signed)
Patient made an appointment for 05/21/21 with Dr. Neldon Mc. She is requesting prescriptions for her nebulizer solution and her albuterol rescue inhaler. [place].

## 2021-04-29 NOTE — Anesthesia Preprocedure Evaluation (Signed)
Anesthesia Evaluation  Patient identified by MRN, date of birth, ID band Patient awake    Reviewed: Allergy & Precautions, NPO status , Patient's Chart, lab work & pertinent test results  Airway Mallampati: II  TM Distance: >3 FB Neck ROM: Full    Dental no notable dental hx. (+) Teeth Intact, Chipped, Dental Advisory Given,    Pulmonary asthma ,    Pulmonary exam normal breath sounds clear to auscultation       Cardiovascular negative cardio ROS Normal cardiovascular exam Rhythm:Regular Rate:Normal     Neuro/Psych Bipolar Disorder negative neurological ROS     GI/Hepatic hiatal hernia, GERD  Medicated,(+) Cirrhosis       ,   Endo/Other  negative endocrine ROS  Renal/GU negative Renal ROS  negative genitourinary   Musculoskeletal negative musculoskeletal ROS (+)   Abdominal   Peds negative pediatric ROS (+)  Hematology negative hematology ROS (+)   Anesthesia Other Findings   Reproductive/Obstetrics negative OB ROS                             Anesthesia Physical  Anesthesia Plan  ASA: III  Anesthesia Plan: MAC   Post-op Pain Management:    Induction: Intravenous  PONV Risk Score and Plan: 2 and Propofol infusion and Treatment may vary due to age or medical condition  Airway Management Planned: Simple Face Mask  Additional Equipment:   Intra-op Plan:   Post-operative Plan:   Informed Consent: I have reviewed the patients History and Physical, chart, labs and discussed the procedure including the risks, benefits and alternatives for the proposed anesthesia with the patient or authorized representative who has indicated his/her understanding and acceptance.     Dental advisory given  Plan Discussed with: CRNA and Surgeon  Anesthesia Plan Comments:         Anesthesia Quick Evaluation

## 2021-04-29 NOTE — Transfer of Care (Signed)
Immediate Anesthesia Transfer of Care Note  Patient: [name]  Procedure(s) Performed: ESOPHAGOGASTRODUODENOSCOPY (EGD) WITH PROPOFOL HOT HEMOSTASIS (ARGON PLASMA COAGULATION/BICAP)  Patient Location: PACU and Endoscopy Unit  Anesthesia Type:MAC  Level of Consciousness: awake, alert  and patient cooperative  Airway & Oxygen Therapy: Patient Spontanous Breathing and Patient connected to face mask oxygen  Post-op Assessment: Report given to RN and Post -op Vital signs reviewed and stable  Post vital signs: Reviewed and stable  Last Vitals:  Vitals Value Taken Time  BP    Temp    Pulse    Resp    SpO2      Last Pain:  Vitals:   04/29/21 0818  TempSrc: Oral  PainSc: 0-No pain         Complications: No notable events documented.

## 2021-04-29 NOTE — Op Note (Signed)
[facility] Patient Name: Linda Becker Procedure Date: 04/29/2021 MRN: [id number] Attending MD: Jerene Bears , MD Date of Birth: Oct 24, 1990 CSN: [id number] Age: 30 Admit Type: Outpatient Procedure:                Upper GI endoscopy Indications:              Patient with cirrhosis, known portal hypertension                            and history of esophageal varices (including prior                            EVL for bleeding in Aug 2021), last EGD without                            banding April 2022, chronic epigastric pain and                            nausea, recent anemia Providers:                [name]. [name], MD, Mariana Arn, Luan Moore,                            Technician, Christell Faith, CRNA Referring MD:             Enis Gash, MD ([facility]) Medicines:                Monitored Anesthesia Care Complications:            No immediate complications. Estimated Blood Loss:     Estimated blood loss was minimal. Procedure:                Pre-Anesthesia Assessment:                           - Prior to the procedure, a History and Physical                            was performed, and patient medications and                            allergies were reviewed. The patient's tolerance of                            previous anesthesia was also reviewed. The risks                            and benefits of the procedure and the sedation                            options and risks were discussed with the patient.                            All questions were answered, and informed consent  was obtained. Prior Anticoagulants: The patient has                            taken no previous anticoagulant or antiplatelet                            agents. ASA Grade Assessment: III - A patient with                            severe systemic disease. After reviewing the risks                            and benefits, the patient was  deemed in                            satisfactory condition to undergo the procedure.                           After obtaining informed consent, the endoscope was                            passed under direct vision. Throughout the                            procedure, the patient's blood pressure, pulse, and                            oxygen saturations were monitored continuously. The                            [id number]-H190 ([id number]) Olympus endoscope was introduced                            through the mouth, and advanced to the second part                            of duodenum. The upper GI endoscopy was                            accomplished without difficulty. The patient                            tolerated the procedure well. Scope In: Scope Out: Findings:      Grade I, small (< 5 mm) varices with no bleeding and no stigmata of       recent bleeding were found in the lower third of the esophagus. These       varices flatten completely with insufflation and thus repeat banding not       performed today. Scarring from prior treatment was visible.      A 2 cm hiatal hernia was present. No evidence of gastric varices.      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 No       evidence of portal gastropathy.      Mild gastric  antral vascular ectasia without bleeding but with scant       adherent heme was present in the gastric antrum. Fulguration to ablate       the lesion to prevent bleeding by argon plasma at 0.8 liters/minute and       30 watts was successful.      The examined duodenum was normal. Impression:               - Grade I and small (< 5 mm) esophageal varices                            with no bleeding and no stigmata of recent                            bleeding. Not amenable to band ligation due to very                            small size. Scarring visible from prior successful                            treatment.                           - 2  cm hiatal hernia.                           - A sleeve gastrectomy was found.                           - No evidence of gastric varices or portal                            gastropathy.                           - Gastric antral vascular ectasia without bleeding                            but with adherent heme. Treated with argon plasma                            coagulation (APC).                           - Normal examined duodenum.                           - No specimens collected. Moderate Sedation:      N/A Recommendation:           - Patient has a contact number available for                            emergencies. The signs and symptoms of potential                            delayed complications were discussed with the  patient. Return to normal activities tomorrow.                            Written discharge instructions were provided to the                            patient.                           - Resume previous diet.                           - Continue present medications including                            pantoprazole 40 mg twice daily.                           - No aspirin, ibuprofen, naproxen, or other                            non-steroidal anti-inflammatory drugs.                           - Limit acetaminophen to 2 g every 24 hours.                           - Stop oral iron given nausea.                           - Repeat CBC, ferritin + IBC later this week                            ([place] [facility]).                           - Consider repeat EGD in 12 months, unless needed                            sooner for anemia/GAVE treatment.                           - Office follow-up appointment at [facility]                            as recommended. Procedure Code(s):        --- Professional ---                           [phone], Esophagogastroduodenoscopy, flexible,                            transoral; with control of  bleeding, any method Diagnosis Code(s):        --- Professional ---                           I85.00, Esophageal  varices without bleeding                           K44.9, Diaphragmatic hernia without obstruction or                            gangrene                           Z98.84, Bariatric surgery status                           K31.819, Angiodysplasia of stomach and duodenum                            without bleeding CPT copyright 2019 American [employer] Association. All rights reserved. The codes documented in this report are preliminary and upon coder review may  be revised to meet current compliance requirements. Jerene Bears, MD 04/29/2021 9:35:29 AM This report has been signed electronically. Number of Addenda: 0

## 2021-04-29 NOTE — Discharge Instructions (Signed)

## 2021-04-29 NOTE — Anesthesia Postprocedure Evaluation (Signed)
Anesthesia Post Note  Patient: [name]  Procedure(s) Performed: ESOPHAGOGASTRODUODENOSCOPY (EGD) WITH PROPOFOL HOT HEMOSTASIS (ARGON PLASMA COAGULATION/BICAP)     Patient location during evaluation: PACU Anesthesia Type: MAC Level of consciousness: awake and alert Pain management: pain level controlled Vital Signs Assessment: post-procedure vital signs reviewed and stable Respiratory status: spontaneous breathing, nonlabored ventilation, respiratory function stable and patient connected to nasal cannula oxygen Cardiovascular status: stable and blood pressure returned to baseline Postop Assessment: no apparent nausea or vomiting Anesthetic complications: no   No notable events documented.  Last Vitals:  Vitals:   04/29/21 0942 04/29/21 0950  BP: 138/72 (!) 148/89  Pulse: 77 75  Resp: (!) 23 20  Temp:    SpO2: 99% 99%    Last Pain:  Vitals:   04/29/21 0929  TempSrc: Oral  PainSc:                  Linda Becker

## 2021-04-29 NOTE — H&P (Signed)
GASTROENTEROLOGY PROCEDURE H&P NOTE   Primary Care Physician: Deon Pilling, NP    Reason for Procedure:  Cirrhosis with portal hypertension, progressive anemia, upper abdominal pain, nausea  Plan:    EGD  The nature of the procedure, as well as the risks, benefits, and alternatives were carefully and thoroughly reviewed with the patient. Ample time for discussion and questions allowed. The patient understood, was satisfied, and agreed to proceed.     HPI: Linda Becker is a 30 y.o. female who presents for EGD in the outpatient hospital setting.  Medical history as below.  Personal history of NASH cirrhosis with portal hypertension, prior variceal hemorrhage status post EVL in August 2021.  Very small varices not amenable to further banding at last upper endoscopy in April 2022.  Question of GAVE was raised at that time though at that time her hemoglobin was normal.  She has continued to have upper abdominal pain on a daily basis.  Frequent nausea, occasional vomiting.  No melena though stools are dark on iron.  She has felt constipated of late.  No red blood per rectum.  She saw Dr. Loletha Grayer at [facility] about 1 month ago.  She is still waiting for pain management clinic.  Hemoglobin when checked last month was 9.4, INR 1.1.  Platelets 111  Past Medical History:  Diagnosis Date   Allergic rhinitis    remote hx of allergy shots   Asthma    since childhood  on controller meds extrinsic dr Carmelina Peal   Bipolar II disorder (HCC)    Cirrhosis ([place])    Esophageal varices ([place])    Family history of adverse reaction to anesthesia    mom and sister difficult to awaken per patient    Gallbladder sludge    Gastropathy    GERD (gastroesophageal reflux disease)    on nexium  for  long term sx since childhood   H/O miscarriage, not currently pregnant    [redacted] weeks  march 2016   Hepatic steatosis    Hiatal hernia    Hypothyroidism    Migraine    Murmur    pt reports MVP   Portal  hypertension (HCC)    Sessile colonic polyp    Splenomegaly    Steatohepatitis    Syncope    under eval ? cause   Tachycardia    episodes with near syncope eval dr Nadyne Coombes  on no meds dced LABA   Thrombocytopenia ([place])    Upper GI bleed     Past Surgical History:  Procedure Laterality Date   BIOPSY  09/03/2018   Procedure: BIOPSY;  Surgeon: Jerene Bears, MD;  Location: [facility];  Service: Gastroenterology;;   BIOPSY  09/26/2020   Procedure: BIOPSY;  Surgeon: Jerene Bears, MD;  Location: [facility];  Service: Gastroenterology;;   broken right femur  2008   rod placed   COLONOSCOPY WITH PROPOFOL N/A 09/03/2018   Procedure: COLONOSCOPY WITH PROPOFOL;  Surgeon: Jerene Bears, MD;  Location: [facility];  Service: Gastroenterology;  Laterality: N/A;   DILATION AND CURETTAGE OF UTERUS     ESOPHAGOGASTRODUODENOSCOPY (EGD) WITH PROPOFOL N/A 09/03/2018   Procedure: ESOPHAGOGASTRODUODENOSCOPY (EGD) WITH PROPOFOL;  Surgeon: Jerene Bears, MD;  Location: [facility];  Service: Gastroenterology;  Laterality: N/A;   ESOPHAGOGASTRODUODENOSCOPY (EGD) WITH PROPOFOL N/A 01/27/2020   Procedure: ESOPHAGOGASTRODUODENOSCOPY (EGD) WITH PROPOFOL;  Surgeon: Rush Landmark Telford Nab., MD;  Location: [place];  Service: Gastroenterology;  Laterality: N/A;   ESOPHAGOGASTRODUODENOSCOPY (EGD) WITH PROPOFOL  N/A 03/12/2020   Procedure: ESOPHAGOGASTRODUODENOSCOPY (EGD) WITH PROPOFOL;  Surgeon: Jerene Bears, MD;  Location: [facility];  Service: Gastroenterology;  Laterality: N/A;   ESOPHAGOGASTRODUODENOSCOPY (EGD) WITH PROPOFOL N/A 09/26/2020   Procedure: ESOPHAGOGASTRODUODENOSCOPY (EGD) WITH PROPOFOL;  Surgeon: Jerene Bears, MD;  Location: [facility];  Service: Gastroenterology;  Laterality: N/A;   GASTRIC VARICES BANDING  01/27/2020   Procedure: GASTRIC VARICES BANDING;  Surgeon: Rush Landmark Telford Nab., MD;  Location: [place];  Service: Gastroenterology;;   IR TRANSCATHETER BX  01/19/2019   IR US GUIDE  VASC ACCESS RIGHT  01/19/2019   IR VENOGRAM HEPATIC W HEMODYNAMIC EVALUATION  01/19/2019   LAPAROSCOPIC GASTRIC SLEEVE RESECTION N/A 06/28/2019   Procedure: LAPAROSCOPIC GASTRIC SLEEVE RESECTION, Upper Endo, ERAS Pathway;  Surgeon: Clovis Riley, MD;  Location: [facility];  Service: General;  Laterality: N/A;   OB ultrasound N/A 12/01/2017   see report   POLYPECTOMY  09/03/2018   Procedure: POLYPECTOMY;  Surgeon: Jerene Bears, MD;  Location: [facility];  Service: Gastroenterology;;   TONSILLECTOMY  2006   WISDOM TOOTH EXTRACTION      Prior to Admission medications   Medication Sig Start Date End Date Taking? Authorizing Provider  albuterol (PROVENTIL) (2.5 MG/3ML) 0.083% nebulizer solution Take 3 mLs (2.5 mg total) by nebulization every 4 (four) hours as needed for wheezing or shortness of breath. 01/29/20  Yes Aline August, MD  albuterol (VENTOLIN HFA) 108 (90 Base) MCG/ACT inhaler INHALE 2 PUFFS BY MOUTH INTO THE LUNGS EVERY 4 TO 6 HOURS AS NEEDED FOR COUGH OR WHEEZE Patient taking differently: Inhale 2 puffs into the lungs every 4 (four) hours as needed for shortness of breath or wheezing. 07/19/20 07/19/21 Yes Padgett, Rae Halsted, MD  Ascorbic Acid (VITAMIN C) 1000 MG tablet Take 2,000 mg by mouth 2 (two) times daily.   Yes [provider]  Beclomethasone Dipropionate (QNASL) 80 MCG/ACT AERS INSTILL 1 SPRAY IN EACH NOSTRIL ONCE DAILY AS DIRECTED Patient taking differently: Place 1 spray into the nose 2 (two) times daily. 09/29/19  Yes Kozlow, Donnamarie Poag, MD  CALCIUM PO Take 1 tablet by mouth 2 (two) times daily.   Yes [provider]  Cetirizine HCl 10 MG CAPS Take 10 mg by mouth 2 (two) times daily.    Yes [provider]  diphenhydrAMINE (BENADRYL) 25 MG tablet Take 50 mg by mouth at bedtime as needed for allergies or sleep.   Yes [provider]  escitalopram (LEXAPRO) 10 MG tablet Take 1/2 tablet by mouth daily for 2 weeks then take 1 tablet  daily Patient taking differently: Take 10 mg by mouth daily. 03/18/21  Yes   famotidine (PEPCID) 40 MG tablet Take 1 tablet by mouth at bedtime. 03/20/21  Yes Noralyn Pick, NP  ferrous sulfate 325 (65 FE) MG tablet Take 1 tablet (325 mg total) by mouth daily with breakfast. 01/28/21  Yes Jezlyn Westerfield, [name], MD  gabapentin (NEURONTIN) 100 MG capsule Take 1 capsule (100 mg total) by mouth daily. 03/18/21  Yes   hydrOXYzine (ATARAX/VISTARIL) 25 MG tablet Take 1 tablet (25 mg total) by mouth at bedtime as needed for anxiety/insomnia 03/18/21  Yes   montelukast (SINGULAIR) 10 MG tablet TAKE 1 TABLET BY MOUTH ONCE DAILY Patient taking differently: Take 10 mg by mouth daily. 08/07/20 08/07/21 Yes Kozlow, Donnamarie Poag, MD  nadolol (CORGARD) 20 MG tablet Take 1 tablet (20 mg total) by mouth daily. 10/01/20 10/01/21 Yes Loden Laurent, [name], MD  Olopatadine HCl 0.2 %  SOLN Can use one drop in each eye once daily if needed. Patient taking differently: Place 1 drop into both eyes daily as needed (allergies). 09/29/19  Yes Kozlow, Donnamarie Poag, MD  ondansetron (ZOFRAN-ODT) 8 MG disintegrating tablet DISSOLVE 1 TABLET BY MOUTH EVERY 8 HOURS AS NEEDED FOR NAUSEA OR VOMITING Patient taking differently: Take 8 mg by mouth every 8 (eight) hours as needed for nausea or vomiting. 08/23/20 08/23/21 Yes Zehr, Laban Emperor, PA-C  pantoprazole (PROTONIX) 40 MG tablet Take 1 tablet (40 mg total) by mouth 2 (two) times daily before a meal. 11/22/20 11/22/21 Yes Camry Theiss, [name], MD  Prenatal Vit-Fe Fumarate-FA (MULTIVITAMIN-PRENATAL) 27-0.8 MG TABS tablet Take 1 tablet by mouth 2 (two) times daily.   Yes [provider]  promethazine (PHENERGAN) 25 MG tablet Take 1 tablet by mouth every 6 hours as needed for vomiting or nausea. 04/16/21  Yes Lillyrose Reitan, [name], MD  ursodiol (ACTIGALL) 300 MG capsule Take 1 capsule (300 mg total) by mouth 2 (two) times daily 04/01/21  Yes   verapamil (CALAN-SR) 180 MG CR tablet Take 1 tablet (180 mg total) by mouth at  bedtime. 04/24/21  Yes Cantwell, Celeste C, PA-C  VITAMIN D PO Take 1 capsule by mouth 2 (two) times daily.   Yes [provider]  VITAMIN E PO Take 1 capsule by mouth daily.   Yes [provider]    Current Facility-Administered Medications  Medication Dose Route Frequency Provider Last Rate Last Admin   0.9 %  sodium chloride infusion   Intravenous Continuous Jiayi Lengacher, [name], MD       lactated ringers infusion   Intravenous Continuous Breckyn Troyer, [name], MD       Facility-Administered Medications Ordered in Other Encounters  Medication Dose Route Frequency Provider Last Rate Last Admin   promethazine (PHENERGAN) tablet 12.5-25 mg  12.5-25 mg Oral Q4H PRN Clovis Riley, MD       Or   promethazine (PHENERGAN) suppository 12.5-25 mg  12.5-25 mg Rectal Q4H PRN Romana Juniper A, MD       sodium chloride 0.9 % 1,000 mL with thiamine 630 mg, folic acid 1 mg, multivitamins adult 10 mL infusion   Intravenous Continuous Clovis Riley, MD        Allergies as of 04/10/2021 - Review Complete 03/26/2021  Allergen Reaction Noted   Progesterone Rash 05/19/2017   Penicillins Rash 02/10/2018    Family History  Problem Relation Age of Onset   Liver disease Mother 52       advanced non-alcoholic cirrhosis   Arthritis Mother    Hyperlipidemia Mother    Heart disease Mother    Hypertension Mother    Diabetes Mother    Kidney disease Mother    Bleeding Disorder Mother    Colon polyps Mother    Asthma Father    Arthritis Father    Hyperlipidemia Father    Heart disease Father    Hypertension Father    Diabetes Father    Colon polyps Father    Breast cancer Maternal Aunt    Breast cancer Paternal Aunt    Esophageal cancer Paternal Aunt    Arthritis Maternal Grandmother    Breast cancer Maternal Grandmother    Thyroid disease Maternal Grandmother    Diabetes Maternal Grandfather    Pancreatic cancer Maternal Grandfather    Breast cancer Paternal Grandmother    Diabetes  Paternal Grandmother    Colon cancer Paternal Grandfather    Diabetes Paternal Grandfather  Liver cancer Paternal Grandfather    Stomach cancer Paternal Grandfather     Social History   Socioeconomic History   Marital status: Married    Spouse name: Not on file   Number of children: 1   Years of education: Not on file   Highest education level: Not on file  Occupational History   Not on file  Tobacco Use   Smoking status: Never   Smokeless tobacco: Never  Vaping Use   Vaping Use: Never used  Substance and Sexual Activity   Alcohol use: No    Alcohol/week: 0.0 standard drinks   Drug use: No   Sexual activity: Yes    Birth control/protection: None  Other Topics Concern   Not on file  Social History Narrative   6-7 hours of sleep per night   Works full time   Lives with her parents sis and  Infant nephew   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   Divorced   8 week preg loss 2022/09/04     Son passed away 08-15-17   Social Determinants of Radio broadcast assistant Strain: Not on file  Food Insecurity: Not on file  Transportation Needs: Not on file  Physical Activity: Not on file  Stress: Not on file  Social Connections: Not on file  Intimate Partner Violence: Not on file    Physical Exam: Vital signs in last 24 hours: @BP  (!) 138/109   Pulse 81   Temp 98.6 F (37 C) (Oral)   Resp 13   Ht 5' 6.25" (1.683 m)   Wt 135.2 kg   LMP 03/18/2021 (Exact Date)   SpO2 100%   BMI 47.75 kg/m  GEN: NAD EYE: Sclerae anicteric ENT: MMM CV: Non-tachycardic Pulm: CTA b/l GI: Soft, NT/ND NEURO:  Alert & Oriented x 3   Zenovia Jarred, MD [facility]  04/29/2021 8:57 AM

## 2021-04-29 NOTE — Telephone Encounter (Signed)
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

## 2021-04-29 NOTE — Telephone Encounter (Signed)
Left a detailed message informing patient that courtesy refills have been sent to the requested pharmacy and for her to keep her appointment on 05/21/2021 for further refills.

## 2021-05-01 ENCOUNTER — Encounter (HOSPITAL_COMMUNITY): Payer: Self-pay | Admitting: Internal Medicine

## 2021-05-02 ENCOUNTER — Telehealth: Payer: Self-pay | Admitting: Pharmacy Technician

## 2021-05-02 ENCOUNTER — Other Ambulatory Visit: Payer: Self-pay

## 2021-05-02 ENCOUNTER — Other Ambulatory Visit: Payer: Self-pay | Admitting: Internal Medicine

## 2021-05-02 ENCOUNTER — Other Ambulatory Visit (INDEPENDENT_AMBULATORY_CARE_PROVIDER_SITE_OTHER): Payer: 59

## 2021-05-02 DIAGNOSIS — K7581 Nonalcoholic steatohepatitis (NASH): Secondary | ICD-10-CM

## 2021-05-02 DIAGNOSIS — D509 Iron deficiency anemia, unspecified: Secondary | ICD-10-CM | POA: Diagnosis not present

## 2021-05-02 DIAGNOSIS — K7460 Unspecified cirrhosis of liver: Secondary | ICD-10-CM | POA: Diagnosis not present

## 2021-05-02 LAB — CBC WITH DIFFERENTIAL/PLATELET
Basophils Absolute: 0 10*3/uL (ref 0.0–0.1)
Basophils Relative: 0.7 % (ref 0.0–3.0)
Eosinophils Absolute: 0.7 10*3/uL (ref 0.0–0.7)
Eosinophils Relative: 13.1 % — ABNORMAL HIGH (ref 0.0–5.0)
HCT: 31.4 % — ABNORMAL LOW (ref 36.0–46.0)
Hemoglobin: 9.9 g/dL — ABNORMAL LOW (ref 12.0–15.0)
Lymphocytes Relative: 26 % (ref 12.0–46.0)
Lymphs Abs: 1.4 10*3/uL (ref 0.7–4.0)
MCHC: 31.5 g/dL (ref 30.0–36.0)
MCV: 81.5 fl (ref 78.0–100.0)
Monocytes Absolute: 0.3 10*3/uL (ref 0.1–1.0)
Monocytes Relative: 5.5 % (ref 3.0–12.0)
Neutro Abs: 3 10*3/uL (ref 1.4–7.7)
Neutrophils Relative %: 54.7 % (ref 43.0–77.0)
Platelets: 120 10*3/uL — ABNORMAL LOW (ref 150.0–400.0)
RBC: 3.86 Mil/uL — ABNORMAL LOW (ref 3.87–5.11)
RDW: 17.6 % — ABNORMAL HIGH (ref 11.5–15.5)
WBC: 5.5 10*3/uL (ref 4.0–10.5)

## 2021-05-02 LAB — IBC PANEL
Iron: 43 ug/dL (ref 42–145)
Saturation Ratios: 8.8 % — ABNORMAL LOW (ref 20.0–50.0)
TIBC: 487.2 ug/dL — ABNORMAL HIGH (ref 250.0–450.0)
Transferrin: 348 mg/dL (ref 212.0–360.0)

## 2021-05-02 LAB — FERRITIN: Ferritin: 6 ng/mL — ABNORMAL LOW (ref 10.0–291.0)

## 2021-05-02 NOTE — Telephone Encounter (Signed)
Dr. [name],  Linda Becker NOTE:  Auth Submission: [place] NEEDED Payer: Linda Becker Medication & CPT/J Code(s) submitted: Feraheme (ferumoxytol) [phone] Route of submission (phone, fax, portal): PHONE: [phone] Auth type: Buy/Bill Units/visits requested: 2 Reference number: [id number] Approval from: 05/02/21 to 07/17/20   Patient will be scheduled as soon as possible.

## 2021-05-05 ENCOUNTER — Other Ambulatory Visit: Payer: Self-pay | Admitting: Nurse Practitioner

## 2021-05-05 ENCOUNTER — Other Ambulatory Visit (HOSPITAL_COMMUNITY): Payer: Self-pay

## 2021-05-05 ENCOUNTER — Other Ambulatory Visit: Payer: Self-pay | Admitting: Internal Medicine

## 2021-05-06 ENCOUNTER — Other Ambulatory Visit: Payer: Self-pay

## 2021-05-06 ENCOUNTER — Encounter (HOSPITAL_COMMUNITY): Payer: Self-pay

## 2021-05-06 ENCOUNTER — Emergency Department (HOSPITAL_COMMUNITY)
Admission: EM | Admit: 2021-05-06 | Discharge: 2021-05-06 | Disposition: A | Discharge status: Home / Self Care | Payer: 59 | Attending: Emergency Medicine | Admitting: Emergency Medicine

## 2021-05-06 ENCOUNTER — Other Ambulatory Visit (HOSPITAL_COMMUNITY): Payer: Self-pay

## 2021-05-06 ENCOUNTER — Ambulatory Visit: Payer: 59

## 2021-05-06 DIAGNOSIS — Z5321 Procedure and treatment not carried out due to patient leaving prior to being seen by health care provider: Secondary | ICD-10-CM | POA: Diagnosis not present

## 2021-05-06 DIAGNOSIS — R1084 Generalized abdominal pain: Secondary | ICD-10-CM

## 2021-05-06 DIAGNOSIS — R1012 Left upper quadrant pain: Secondary | ICD-10-CM | POA: Diagnosis present

## 2021-05-06 DIAGNOSIS — R110 Nausea: Secondary | ICD-10-CM | POA: Diagnosis not present

## 2021-05-06 LAB — CBC
HCT: 34.8 % — ABNORMAL LOW (ref 36.0–46.0)
Hemoglobin: 10.2 g/dL — ABNORMAL LOW (ref 12.0–15.0)
MCH: 25.2 pg — ABNORMAL LOW (ref 26.0–34.0)
MCHC: 29.3 g/dL — ABNORMAL LOW (ref 30.0–36.0)
MCV: 86.1 fL (ref 80.0–100.0)
Platelets: 129 10*3/uL — ABNORMAL LOW (ref 150–400)
RBC: 4.04 MIL/uL (ref 3.87–5.11)
RDW: 16 % — ABNORMAL HIGH (ref 11.5–15.5)
WBC: 6.2 10*3/uL (ref 4.0–10.5)
nRBC: 0 % (ref 0.0–0.2)

## 2021-05-06 LAB — COMPREHENSIVE METABOLIC PANEL
ALT: 18 U/L (ref 0–44)
AST: 21 U/L (ref 15–41)
Albumin: 3.8 g/dL (ref 3.5–5.0)
Alkaline Phosphatase: 67 U/L (ref 38–126)
Anion gap: 6 (ref 5–15)
BUN: 13 mg/dL (ref 6–20)
CO2: 21 mmol/L — ABNORMAL LOW (ref 22–32)
Calcium: 8.5 mg/dL — ABNORMAL LOW (ref 8.9–10.3)
Chloride: 111 mmol/L (ref 98–111)
Creatinine, Ser: 0.86 mg/dL (ref 0.44–1.00)
GFR, Estimated: >60 mL/min (ref >60)
Glucose, Bld: 114 mg/dL — ABNORMAL HIGH (ref 70–99)
Potassium: 4.9 mmol/L (ref 3.5–5.1)
Sodium: 138 mmol/L (ref 135–145)
Total Bilirubin: 0.7 mg/dL (ref 0.3–1.2)
Total Protein: 6.5 g/dL (ref 6.5–8.1)

## 2021-05-06 LAB — LIPASE, BLOOD: Lipase: 20 U/L (ref 11–51)

## 2021-05-06 LAB — I-STAT BETA HCG BLOOD, ED (MC, WL, AP ONLY): I-stat hCG, quantitative: <5 m[IU]/mL (ref <5)

## 2021-05-06 MED ORDER — TRAZODONE HCL 50 MG PO TABS
ORAL_TABLET | ORAL | 1 refills | Status: DC
Start: 2021-05-06 — End: 2021-05-23
  Filled 2021-05-06: qty 30, 30d supply, fill #0

## 2021-05-06 MED ORDER — METHYLPREDNISOLONE SODIUM SUCC 125 MG IJ SOLR: 125.0000 mg | Freq: Once | INTRAMUSCULAR | Status: DC | PRN

## 2021-05-06 MED ORDER — SODIUM CHLORIDE 0.9 % IV SOLN: Freq: Once | INTRAVENOUS | Status: DC | PRN

## 2021-05-06 MED ORDER — FAMOTIDINE 40 MG PO TABS
40.0000 mg | ORAL_TABLET | Freq: Every day | ORAL | 1 refills | Status: DC
  Filled 2021-05-06 – 2021-05-20 (×2): qty 30, 30d supply, fill #0

## 2021-05-06 MED ORDER — PANTOPRAZOLE SODIUM 40 MG PO TBEC
40.0000 mg | DELAYED_RELEASE_TABLET | Freq: Two times a day (BID) | ORAL | 3 refills | Status: DC
  Filled 2021-05-06: qty 60, 30d supply, fill #0
  Filled 2021-05-27: qty 60, 30d supply, fill #1
  Filled 2021-06-23 – 2021-06-24 (×3): qty 60, 30d supply, fill #2
  Filled 2021-06-24 – 2021-07-04 (×2): qty 30, 30d supply, fill #2
  Filled 2021-07-15: qty 30, 30d supply, fill #3
  Filled 2021-08-26: qty 30, 30d supply, fill #4
  Filled 2021-09-25: qty 30, 30d supply, fill #5
  Filled 2021-09-30: qty 30, 15d supply, fill #5

## 2021-05-06 MED ORDER — FAMOTIDINE IN NACL 20-0.9 MG/50ML-% IV SOLN: 20.0000 mg | Freq: Once | INTRAVENOUS | Status: DC | PRN

## 2021-05-06 MED ORDER — PROMETHAZINE HCL 25 MG PO TABS
25.0000 mg | ORAL_TABLET | Freq: Four times a day (QID) | ORAL | 0 refills | Status: DC | PRN
Start: 2021-05-06 — End: 2021-05-31
  Filled 2021-05-06: qty 120, 30d supply, fill #0

## 2021-05-06 MED ORDER — DIPHENHYDRAMINE HCL 50 MG/ML IJ SOLN: 50.0000 mg | Freq: Once | INTRAMUSCULAR | Status: DC | PRN

## 2021-05-06 MED ORDER — EPINEPHRINE 0.3 MG/0.3ML IJ SOAJ: 0.3000 mg | Freq: Once | INTRAMUSCULAR | Status: DC | PRN

## 2021-05-06 MED ORDER — SODIUM CHLORIDE 0.9 % IV SOLN
510.0000 mg | Freq: Once | INTRAVENOUS | Status: DC
  Filled 2021-05-06: qty 17

## 2021-05-06 MED ORDER — ALBUTEROL SULFATE HFA 108 (90 BASE) MCG/ACT IN AERS: 2.0000 | INHALATION_SPRAY | Freq: Once | RESPIRATORY_TRACT | Status: DC | PRN

## 2021-05-06 NOTE — ED Provider Notes (Signed)
Emergency Medicine Provider Triage Evaluation Note  [name] , a 30 y.o. female  was evaluated in triage.  Pt complains of upper abdominal pain.  Review of Systems  Positive: Upper abdominal pain.  Nausea. Negative: fever  Physical Exam  BP 120/75 (BP Location: Left Arm)   Pulse (!) 113   Temp 98 F (36.7 C) (Oral)   Resp 18   Ht 5' 6.25" (1.683 m)   Wt 131.5 kg   LMP 05/05/2021   SpO2 97%   BMI 46.45 kg/m  Gen:   Awake, no distress   Resp:  Normal effort  Upper abdominal tenderness without rebound or guarding.  No hernia palpated.   Medical Decision Making  Medically screening exam initiated at 10:54 AM.  Appropriate orders placed.  GLADIES SOFRANKO was informed that the remainder of the evaluation will be completed by another provider, this initial triage assessment does not replace that evaluation, and the importance of remaining in the ED until their evaluation is complete.  Patient with cirrhosis.  Upper abdominal pain.  Has had for a little while now.  Worsening.  States worse with eating.  Really cannot eat.  Recent upper endoscopy.  Sees Dr. [name].  We will get basic blood work.   Davonna Belling, MD 05/06/21 1103

## 2021-05-06 NOTE — Telephone Encounter (Signed)
Without APP visits this week, I think she needs to be seen in the ER for this new, out of proportion to chronic abd pain JMP

## 2021-05-06 NOTE — ED Triage Notes (Addendum)
Patient reports a history of acute cirrhosis and has frequent endoscopies.  Patient reports since having her last endoscopy 1 week ago, she has had to stop eating solid food because it increases her pain level. Patient c/o LUQ pain that wraps around to the back. Patient states she was scheduled to receive an infusion this afternoon at 1400 today.

## 2021-05-07 NOTE — Progress Notes (Signed)
Patient was not seen.

## 2021-05-11 ENCOUNTER — Other Ambulatory Visit (HOSPITAL_COMMUNITY): Payer: Self-pay

## 2021-05-13 ENCOUNTER — Ambulatory Visit (INDEPENDENT_AMBULATORY_CARE_PROVIDER_SITE_OTHER): Payer: 59 | Admitting: *Deleted

## 2021-05-13 ENCOUNTER — Other Ambulatory Visit: Payer: Self-pay

## 2021-05-13 VITALS — BP 97/59 | HR 95 | Temp 98.6°F | Resp 18 | Ht 66.0 in | Wt 293.2 lb

## 2021-05-13 DIAGNOSIS — D509 Iron deficiency anemia, unspecified: Secondary | ICD-10-CM | POA: Diagnosis not present

## 2021-05-13 MED ORDER — SODIUM CHLORIDE 0.9 % IV SOLN
510.0000 mg | Freq: Once | INTRAVENOUS | Status: AC
  Administered 2021-05-13: 10:00:00 510 mg via INTRAVENOUS
  Filled 2021-05-13: qty 17

## 2021-05-13 NOTE — Progress Notes (Signed)
Diagnosis: Iron Deficiency Anemia  Provider:  Marshell Garfinkel, MD  Procedure: Infusion  IV Type: Peripheral, IV Location: R Forearm  Feraheme (Ferumoxytol), Dose: 510 mg  Infusion Start Time: 0277 am  Infusion Stop Time: 1018  am  Post Infusion IV Care: Observation period completed and Peripheral IV Discontinued  Discharge: Condition: Good, Destination: Home . AVS provided to patient.   Performed by:  Oren Beckmann, RN

## 2021-05-14 ENCOUNTER — Encounter: Payer: Self-pay | Admitting: Internal Medicine

## 2021-05-15 ENCOUNTER — Encounter: Payer: Self-pay | Admitting: Internal Medicine

## 2021-05-15 ENCOUNTER — Other Ambulatory Visit: Payer: Self-pay | Admitting: Pain Medicine

## 2021-05-15 NOTE — Telephone Encounter (Signed)
We have received a notice from [facility]) that patient's sucralfate slurry has been denied. Although we advised that sucralfate tablet is on backorder with no known date of when it will become available, they have indicated that   "the following rules be met for approval: A. The request is for short-term (up to 8 weeks therapy) B. You do NOT have hypersensitivity (allergic reaction) to sucralfate."  Please advise.Marland KitchenMarland KitchenMarland Kitchen

## 2021-05-16 NOTE — Telephone Encounter (Signed)
Sucralfate would be short-term and she has not had an allergic reaction  Thus we can ask for the slurry 1 g 3 times daily AC or at bedtime for 1 month or tablet TIDAC and HS

## 2021-05-20 ENCOUNTER — Other Ambulatory Visit (HOSPITAL_COMMUNITY): Payer: Self-pay

## 2021-05-20 ENCOUNTER — Ambulatory Visit (INDEPENDENT_AMBULATORY_CARE_PROVIDER_SITE_OTHER): Payer: 59

## 2021-05-20 ENCOUNTER — Encounter: Payer: Self-pay | Admitting: Internal Medicine

## 2021-05-20 ENCOUNTER — Other Ambulatory Visit: Payer: Self-pay

## 2021-05-20 VITALS — BP 91/65 | HR 97 | Temp 98.4°F | Resp 16 | Ht 66.0 in | Wt 300.8 lb

## 2021-05-20 DIAGNOSIS — D509 Iron deficiency anemia, unspecified: Secondary | ICD-10-CM

## 2021-05-20 MED ORDER — SODIUM CHLORIDE 0.9 % IV SOLN: Freq: Once | INTRAVENOUS | Status: DC | PRN

## 2021-05-20 MED ORDER — EPINEPHRINE 0.3 MG/0.3ML IJ SOAJ: 0.3000 mg | Freq: Once | INTRAMUSCULAR | Status: DC | PRN

## 2021-05-20 MED ORDER — DIPHENHYDRAMINE HCL 50 MG/ML IJ SOLN: 50.0000 mg | Freq: Once | INTRAMUSCULAR | Status: DC | PRN

## 2021-05-20 MED ORDER — METHYLPREDNISOLONE SODIUM SUCC 125 MG IJ SOLR: 125.0000 mg | Freq: Once | INTRAMUSCULAR | Status: DC | PRN

## 2021-05-20 MED ORDER — FAMOTIDINE IN NACL 20-0.9 MG/50ML-% IV SOLN: 20.0000 mg | Freq: Once | INTRAVENOUS | Status: DC | PRN

## 2021-05-20 MED ORDER — SODIUM CHLORIDE 0.9 % IV SOLN
510.0000 mg | Freq: Once | INTRAVENOUS | Status: AC
  Administered 2021-05-20: 510 mg via INTRAVENOUS
  Filled 2021-05-20: qty 17

## 2021-05-20 MED ORDER — ALBUTEROL SULFATE HFA 108 (90 BASE) MCG/ACT IN AERS: 2.0000 | INHALATION_SPRAY | Freq: Once | RESPIRATORY_TRACT | Status: DC | PRN

## 2021-05-20 NOTE — Progress Notes (Signed)
Diagnosis: Iron Deficiency Anemia  Provider:  Marshell Garfinkel, MD  Procedure: Infusion  IV Type: Peripheral, IV Location: R Hand  Feraheme (Ferumoxytol), Dose: 510 mg  Infusion Start Time: 10.15 05/20/2021  Infusion Stop Time: 10.38 05/20/2021  Post Infusion IV Care: Peripheral IV Discontinued  Discharge: Condition: Good, Destination: Home . AVS provided to patient.   Performed by:  Arnoldo Morale, RN

## 2021-05-21 ENCOUNTER — Ambulatory Visit (INDEPENDENT_AMBULATORY_CARE_PROVIDER_SITE_OTHER): Payer: 59 | Admitting: Allergy and Immunology

## 2021-05-21 ENCOUNTER — Encounter (INDEPENDENT_AMBULATORY_CARE_PROVIDER_SITE_OTHER): Payer: Self-pay

## 2021-05-21 ENCOUNTER — Encounter: Payer: Self-pay | Admitting: Internal Medicine

## 2021-05-21 ENCOUNTER — Encounter: Payer: Self-pay | Admitting: Allergy and Immunology

## 2021-05-21 ENCOUNTER — Other Ambulatory Visit (HOSPITAL_COMMUNITY): Payer: Self-pay

## 2021-05-21 ENCOUNTER — Other Ambulatory Visit: Payer: Self-pay | Admitting: Allergy and Immunology

## 2021-05-21 VITALS — BP 98/70 | HR 124 | Temp 98.3°F | Resp 20 | Ht 66.0 in | Wt 300.8 lb

## 2021-05-21 DIAGNOSIS — J3089 Other allergic rhinitis: Secondary | ICD-10-CM

## 2021-05-21 DIAGNOSIS — D7219 Other eosinophilia: Secondary | ICD-10-CM

## 2021-05-21 DIAGNOSIS — K219 Gastro-esophageal reflux disease without esophagitis: Secondary | ICD-10-CM

## 2021-05-21 DIAGNOSIS — H1010 Acute atopic conjunctivitis, unspecified eye: Secondary | ICD-10-CM | POA: Diagnosis not present

## 2021-05-21 DIAGNOSIS — J4551 Severe persistent asthma with (acute) exacerbation: Secondary | ICD-10-CM | POA: Diagnosis not present

## 2021-05-21 MED ORDER — PROAIR DIGIHALER 108 (90 BASE) MCG/ACT IN AEPB
2.0000 | INHALATION_SPRAY | RESPIRATORY_TRACT | 3 refills | Status: DC | PRN
  Filled 2021-05-21: qty 1, 90d supply, fill #0
  Filled 2021-07-15: qty 1, fill #0
  Filled 2021-09-09 – 2021-09-25 (×2): qty 1, 17d supply, fill #0

## 2021-05-21 MED ORDER — BUDESONIDE 32 MCG/ACT NA SUSP
1.0000 | Freq: Every day | NASAL | 5 refills | Status: DC
  Filled 2021-05-21: qty 8.43, 30d supply, fill #0

## 2021-05-21 MED ORDER — AIRDUO DIGIHALER 232-14 MCG/ACT IN AEPB
1.0000 | INHALATION_SPRAY | Freq: Two times a day (BID) | RESPIRATORY_TRACT | 5 refills | Status: DC
  Filled 2021-05-21: qty 1, 30d supply, fill #0

## 2021-05-21 MED ORDER — LORATADINE 10 MG PO TABS
10.0000 mg | ORAL_TABLET | Freq: Two times a day (BID) | ORAL | 5 refills | Status: DC | PRN
  Filled 2021-05-21 – 2021-05-28 (×4): qty 60, 30d supply, fill #0

## 2021-05-21 MED ORDER — ALBUTEROL SULFATE (2.5 MG/3ML) 0.083% IN NEBU: 2.5000 mg | INHALATION_SOLUTION | RESPIRATORY_TRACT | 1 refills | Status: DC | PRN

## 2021-05-21 NOTE — Progress Notes (Signed)
[place] [facility] [place] [facility] [place]   Follow-up Note  Referring Provider: Deon Pilling, NP Primary Provider: Deon Pilling, NP Date of Office Visit: 05/21/2021  Subjective:   Linda Becker (DOB: [birth year]) is a 30 y.o. female who returns to the [facility] and [place] on 05/21/2021 in re-evaluation of the following:  HPI: Linda Becker returns to this clinic in evaluation of of a multitude of issues.  I last saw her in this clinic on 29 September 2019 for asthma, allergic rhinoconjunctivitis, atopic dermatitis, and reflux.She was subsequently seen in this clinic by Dr. Nelva Bush on 19 July 2020 for COVID.  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  She is now on disability for this issue.  She is being evaluated by the transplant team at [facility].  Concerning her airway issue, because she has no insurance, she has not been using any controller agent and is not using a biologic agent directing against respiratory tract inflammation and her requirement for short acting bronchodilator at this point time is a least twice a day for issues with coughing and shortness of breath.  This is been a longstanding issue over the past year.  In addition, she still has very bad allergies involving her nose and eyes especially when being exposed to various allergens.  She uses antihistamines to address this issue.  She has had a history of reflux that currently is being treated with Protonix 40 mg twice a day yet she still remains symptomatic with regurgitation.  She is status post gastric sleeve performed about 2 years ago.  She does drink 1 tea per day.  Allergies as of 05/21/2021       Reactions   Progesterone Rash   Was in a form of birth control.   Penicillins Rash   Did it involve swelling of the face/tongue/throat, SOB, or low BP? No Did it involve  sudden or severe rash/hives, skin peeling, or any reaction on the inside of your mouth or nose? Yes Did you need to seek medical attention at a hospital or doctor's office? No When did it last happen?      9 + months If all above answers are "NO", may proceed with cephalosporin use.        Medication List    albuterol 108 (90 Base) MCG/ACT inhaler Commonly known as: VENTOLIN HFA INHALE 2 PUFFS BY MOUTH INTO THE LUNGS EVERY 4 TO 6 HOURS AS NEEDED FOR COUGH OR WHEEZE--need office visit for further refills   albuterol (2.5 MG/3ML) 0.083% nebulizer solution Commonly known as: PROVENTIL Take 3 mLs (2.5 mg total) by nebulization every 4 (four) hours as needed for wheezing or shortness of breath.   CALCIUM PO Take 1 tablet by mouth 2 (two) times daily.   Cetirizine HCl 10 MG Caps Take 10 mg by mouth 2 (two) times daily.   dextromethorphan-guaiFENesin 30-600 MG 12hr tablet Commonly known as: MUCINEX DM Take 1 tablet by mouth 2 (two) times daily.   diphenhydrAMINE 25 MG tablet Commonly known as: BENADRYL Take 50 mg by mouth at bedtime as needed for allergies or sleep.   escitalopram 10 MG tablet Commonly known as: Lexapro Take 1/2 tablet by mouth daily for 2 weeks then take 1 tablet daily   famotidine 40 MG tablet Commonly known as: Pepcid Take 1 tablet by mouth at bedtime.   ferrous sulfate 325 (65 FE) MG tablet  Take 1 tablet (325 mg total) by mouth daily with breakfast.   gabapentin 100 MG capsule Commonly known as: NEURONTIN Take 1 capsule (100 mg total) by mouth daily.   hydrOXYzine 25 MG tablet Commonly known as: ATARAX Take 1 tablet (25 mg total) by mouth at bedtime as needed for anxiety/insomnia   montelukast 10 MG tablet Commonly known as: SINGULAIR TAKE 1 TABLET BY MOUTH ONCE DAILY   multivitamin-prenatal 27-0.8 MG Tabs tablet Take 1 tablet by mouth 2 (two) times daily.   nadolol 20 MG tablet Commonly known as: CORGARD Take 1 tablet (20 mg total) by mouth  daily.   Olopatadine HCl 0.2 % Soln Can use one drop in each eye once daily if needed.   ondansetron 8 MG disintegrating tablet Commonly known as: ZOFRAN-ODT DISSOLVE 1 TABLET BY MOUTH EVERY 8 HOURS AS NEEDED FOR NAUSEA OR VOMITING   pantoprazole 40 MG tablet Commonly known as: PROTONIX Take 1 tablet (40 mg total) by mouth 2 (two) times daily before a meal.   promethazine 25 MG tablet Commonly known as: PHENERGAN Take 1 tablet by mouth every 6 hours as needed for vomiting or nausea.   Qnasl 80 MCG/ACT Aers Generic drug: Beclomethasone Dipropionate INSTILL 1 SPRAY IN EACH NOSTRIL ONCE DAILY AS DIRECTED   sucralfate 1 GM/10ML suspension Commonly known as: CARAFATE Take 10 mLs by mouth 4 times daily -  with meals and at bedtime.   traZODone 50 MG tablet Commonly known as: DESYREL Take 1/2 to 1 tablet by mouth at bedtime as needed   ursodiol 300 MG capsule Commonly known as: ACTIGALL Take 1 capsule (300 mg total) by mouth 2 (two) times daily   verapamil 180 MG CR tablet Commonly known as: CALAN-SR Take 1 tablet (180 mg total) by mouth at bedtime.   vitamin C 1000 MG tablet Take 2,000 mg by mouth 2 (two) times daily.   VITAMIN D PO Take 1 capsule by mouth 2 (two) times daily.   VITAMIN E PO Take 1 capsule by mouth daily.    Past Medical History:  Diagnosis Date   Allergic rhinitis    remote hx of allergy shots   Asthma    since childhood  on controller meds extrinsic dr Carmelina Peal   Bipolar II disorder (HCC)    Cirrhosis ([place])    Esophageal varices ([place])    Family history of adverse reaction to anesthesia    mom and sister difficult to awaken per patient    Gallbladder sludge    Gastropathy    GERD (gastroesophageal reflux disease)    on nexium  for  long term sx since childhood   H/O miscarriage, not currently pregnant    [redacted] weeks  march 2016   Hepatic steatosis    Hiatal hernia    Hypothyroidism    Migraine    Murmur    pt reports MVP   Portal  hypertension ([place])    Sessile colonic polyp    Splenomegaly    Steatohepatitis    Syncope    under eval ? cause   Tachycardia    episodes with near syncope eval dr Nadyne Coombes  on no meds dced LABA   Thrombocytopenia ([place])    Upper GI bleed     Past Surgical History:  Procedure Laterality Date   BIOPSY  09/03/2018   Procedure: BIOPSY;  Surgeon: Jerene Bears, MD;  Location: [facility];  Service: Gastroenterology;;   BIOPSY  09/26/2020   Procedure: BIOPSY;  Surgeon: Zenovia Jarred  M, MD;  Location: [facility];  Service: Gastroenterology;;   broken right femur  2008   rod placed   COLONOSCOPY WITH PROPOFOL N/A 09/03/2018   Procedure: COLONOSCOPY WITH PROPOFOL;  Surgeon: Jerene Bears, MD;  Location: [facility];  Service: Gastroenterology;  Laterality: N/A;   DILATION AND CURETTAGE OF UTERUS     ESOPHAGOGASTRODUODENOSCOPY (EGD) WITH PROPOFOL N/A 09/03/2018   Procedure: ESOPHAGOGASTRODUODENOSCOPY (EGD) WITH PROPOFOL;  Surgeon: Jerene Bears, MD;  Location: [facility];  Service: Gastroenterology;  Laterality: N/A;   ESOPHAGOGASTRODUODENOSCOPY (EGD) WITH PROPOFOL N/A 01/27/2020   Procedure: ESOPHAGOGASTRODUODENOSCOPY (EGD) WITH PROPOFOL;  Surgeon: Rush Landmark Telford Nab., MD;  Location: [facility];  Service: Gastroenterology;  Laterality: N/A;   ESOPHAGOGASTRODUODENOSCOPY (EGD) WITH PROPOFOL N/A 03/12/2020   Procedure: ESOPHAGOGASTRODUODENOSCOPY (EGD) WITH PROPOFOL;  Surgeon: Jerene Bears, MD;  Location: [facility];  Service: Gastroenterology;  Laterality: N/A;   ESOPHAGOGASTRODUODENOSCOPY (EGD) WITH PROPOFOL N/A 09/26/2020   Procedure: ESOPHAGOGASTRODUODENOSCOPY (EGD) WITH PROPOFOL;  Surgeon: Jerene Bears, MD;  Location: [facility];  Service: Gastroenterology;  Laterality: N/A;   ESOPHAGOGASTRODUODENOSCOPY (EGD) WITH PROPOFOL N/A 04/29/2021   Procedure: ESOPHAGOGASTRODUODENOSCOPY (EGD) WITH PROPOFOL;  Surgeon: Jerene Bears, MD;  Location: [facility];  Service: Gastroenterology;   Laterality: N/A;   GASTRIC VARICES BANDING  01/27/2020   Procedure: GASTRIC VARICES BANDING;  Surgeon: Rush Landmark Telford Nab., MD;  Location: [facility];  Service: Gastroenterology;;   HOT HEMOSTASIS N/A 04/29/2021   Procedure: HOT HEMOSTASIS (ARGON PLASMA COAGULATION/BICAP);  Surgeon: Jerene Bears, MD;  Location: [facility];  Service: Gastroenterology;  Laterality: N/A;   IR TRANSCATHETER BX  01/19/2019   IR US GUIDE VASC ACCESS RIGHT  01/19/2019   IR VENOGRAM HEPATIC W HEMODYNAMIC EVALUATION  01/19/2019   LAPAROSCOPIC GASTRIC SLEEVE RESECTION N/A 06/28/2019   Procedure: LAPAROSCOPIC GASTRIC SLEEVE RESECTION, Upper Endo, ERAS Pathway;  Surgeon: Clovis Riley, MD;  Location: [facility];  Service: General;  Laterality: N/A;   OB ultrasound N/A 12/01/2017   see report   POLYPECTOMY  09/03/2018   Procedure: POLYPECTOMY;  Surgeon: Jerene Bears, MD;  Location: [facility];  Service: Gastroenterology;;   TONSILLECTOMY  2006   WISDOM TOOTH EXTRACTION      Review of systems negative except as noted in HPI / PMHx or noted below:  Review of Systems  Constitutional: Negative.   HENT: Negative.    Eyes: Negative.   Respiratory: Negative.    Cardiovascular: Negative.   Gastrointestinal: Negative.   Genitourinary: Negative.   Musculoskeletal: Negative.   Skin: Negative.   Neurological: Negative.   Endo/Heme/Allergies: Negative.   Psychiatric/Behavioral: Negative.      Objective:   Vitals:   05/21/21 1049  BP: 98/70  Pulse: (!) 124  Resp: 20  Temp: 98.3 F (36.8 C)  SpO2: 99%   Height: 5' 6"  (167.6 cm)  Weight: (!) 300 lb 12.8 oz (136.4 kg)   Physical Exam Constitutional:      Appearance: She is not diaphoretic.  HENT:     Head: Normocephalic.     Right Ear: Tympanic membrane, ear canal and external ear normal.     Left Ear: Tympanic membrane, ear canal and external ear normal.     Nose: Nose normal. No mucosal edema or rhinorrhea.     Mouth/Throat:     Pharynx: Uvula  midline. No oropharyngeal exudate.  Eyes:     Conjunctiva/sclera: Conjunctivae normal.  Neck:     Thyroid: No thyromegaly.     Trachea: Trachea normal. No tracheal  tenderness or tracheal deviation.  Cardiovascular:     Rate and Rhythm: Normal rate and regular rhythm.     Heart sounds: Normal heart sounds, S1 normal and S2 normal. No murmur heard. Pulmonary:     Effort: No respiratory distress.     Breath sounds: Normal breath sounds. No stridor. No wheezing or rales.  Lymphadenopathy:     Head:     Right side of head: No tonsillar adenopathy.     Left side of head: No tonsillar adenopathy.     Cervical: No cervical adenopathy.  Skin:    Findings: No erythema or rash.     Nails: There is no clubbing.  Neurological:     Mental Status: She is alert.    Diagnostics:    Spirometry was performed and demonstrated an FEV1 of 3.13 at 91 % of predicted.   Results of blood tests obtained 02 May 2021 identifies WBC 5.5, absolute eosinophil 700, absolute lymphocyte 1400, hemoglobin 9.9, platelet 120  Assessment and Plan:   1. Severe persistent asthma with exacerbation   2. Perennial allergic rhinitis   3. Seasonal allergic conjunctivitis   4. Gastroesophageal reflux disease, unspecified whether esophagitis present   5. Other eosinophilia     1.  Treat and prevent inflammation:  A. Airduo 232 - 1 inhalation 2 times per day (specialty pharmacy) B. OTC Budesonide - 1 spray each nostril 1 time per day  2.  Consolidate all caffeine consumption to help with reflux control.  3.  If needed:  A. Proair Digihaler - 2 inhalations every 4-6 hours (specialty pharmacy) B. Antihistamine - loratadine / cetirizine  4. Return to clinic in 4 weeks or earlier if problem   We will start Linda Becker on a collection of anti-inflammatory agents for her airway as noted above and see her back in this clinic in a few weeks to assess her response to this approach.  In the past she has had very  significant asthma and we need to make a determination once again about whether or not she will require a biologic agent to control this issue.  As well, her reflux is still very active and I did have a talk with her today about consolidating her caffeine consumption while she continues to use her therapy directed against reflux.  She will contact me during the interval but if she does well with this plan I will see her back in this clinic in 4 weeks.  Allena Katz, MD Allergy / Immunology [place]

## 2021-05-21 NOTE — Telephone Encounter (Signed)
This is an unfortunate situation and I am sorry that she is having pain.  Her abdominal pain is chronic but I do not have a medical cause for this pain.  Certainly she has cirrhosis and has had issues with portal hypertension but this is not felt to explain her abdominal pain.  Recent endoscopic procedure cross-sectional imaging has not yielded a cause.  I do not feel that opioid pain medications is the best course of action in a 30 year old without a clear etiology of chronic pain, with a diagnosis of cirrhosis with portal hypertension  I am sympathetic to her pain and the lack of available pain specialist  Hopefully with new insurance in January she can be accepted by a pain management clinic.  I would encourage her to discuss her pain further with primary care as well.

## 2021-05-21 NOTE — Patient Instructions (Addendum)
  1.  Treat and prevent inflammation:  A. Airduo 232 - 1 inhalation 2 times per day (specialty pharmacy) B. OTC Budesonide - 1 spray each nostril 1 time per day  2.  Consolidate all caffeine consumption to help with reflux control.  3.  If needed:  A. Proair Digihaler - 2 inhalations every 4-6 hours (specialty pharmacy) B. Antihistamine - loratadine / cetirizine  4. Return to clinic in 4 weeks or earlier if problem

## 2021-05-22 ENCOUNTER — Encounter: Payer: Self-pay | Admitting: Allergy and Immunology

## 2021-05-22 ENCOUNTER — Other Ambulatory Visit (HOSPITAL_COMMUNITY): Payer: Self-pay

## 2021-05-23 ENCOUNTER — Other Ambulatory Visit (INDEPENDENT_AMBULATORY_CARE_PROVIDER_SITE_OTHER): Payer: 59

## 2021-05-23 ENCOUNTER — Other Ambulatory Visit: Payer: Self-pay

## 2021-05-23 ENCOUNTER — Ambulatory Visit (INDEPENDENT_AMBULATORY_CARE_PROVIDER_SITE_OTHER): Payer: 59 | Admitting: Internal Medicine

## 2021-05-23 ENCOUNTER — Other Ambulatory Visit (HOSPITAL_COMMUNITY): Payer: Self-pay

## 2021-05-23 ENCOUNTER — Encounter: Payer: Self-pay | Admitting: Internal Medicine

## 2021-05-23 ENCOUNTER — Ambulatory Visit (INDEPENDENT_AMBULATORY_CARE_PROVIDER_SITE_OTHER)
Admission: RE | Admit: 2021-05-23 | Discharge: 2021-05-23 | Disposition: A | Discharge status: Home / Self Care | Payer: 59 | Source: Ambulatory Visit | Attending: Internal Medicine | Admitting: Internal Medicine

## 2021-05-23 VITALS — BP 100/56 | HR 118 | Ht 66.0 in | Wt 298.4 lb

## 2021-05-23 DIAGNOSIS — I8500 Esophageal varices without bleeding: Secondary | ICD-10-CM

## 2021-05-23 DIAGNOSIS — D509 Iron deficiency anemia, unspecified: Secondary | ICD-10-CM

## 2021-05-23 DIAGNOSIS — K7581 Nonalcoholic steatohepatitis (NASH): Secondary | ICD-10-CM

## 2021-05-23 DIAGNOSIS — K7460 Unspecified cirrhosis of liver: Secondary | ICD-10-CM

## 2021-05-23 DIAGNOSIS — R110 Nausea: Secondary | ICD-10-CM

## 2021-05-23 DIAGNOSIS — R1010 Upper abdominal pain, unspecified: Secondary | ICD-10-CM

## 2021-05-23 DIAGNOSIS — K766 Portal hypertension: Secondary | ICD-10-CM

## 2021-05-23 LAB — CBC WITH DIFFERENTIAL/PLATELET
Basophils Absolute: 0 10*3/uL (ref 0.0–0.1)
Basophils Relative: 0.5 % (ref 0.0–3.0)
Eosinophils Absolute: 0.4 10*3/uL (ref 0.0–0.7)
Eosinophils Relative: 7.2 % — ABNORMAL HIGH (ref 0.0–5.0)
HCT: 33.5 % — ABNORMAL LOW (ref 36.0–46.0)
Hemoglobin: 10.5 g/dL — ABNORMAL LOW (ref 12.0–15.0)
Lymphocytes Relative: 18.6 % (ref 12.0–46.0)
Lymphs Abs: 0.9 10*3/uL (ref 0.7–4.0)
MCHC: 31.2 g/dL (ref 30.0–36.0)
MCV: 86 fl (ref 78.0–100.0)
Monocytes Absolute: 0.2 10*3/uL (ref 0.1–1.0)
Monocytes Relative: 3.5 % (ref 3.0–12.0)
Neutro Abs: 3.5 10*3/uL (ref 1.4–7.7)
Neutrophils Relative %: 70.2 % (ref 43.0–77.0)
Platelets: 82 10*3/uL — ABNORMAL LOW (ref 150.0–400.0)
RBC: 3.9 Mil/uL (ref 3.87–5.11)
RDW: 20.4 % — ABNORMAL HIGH (ref 11.5–15.5)
WBC: 5 10*3/uL (ref 4.0–10.5)

## 2021-05-23 LAB — COMPREHENSIVE METABOLIC PANEL
ALT: 15 U/L (ref 0–35)
AST: 15 U/L (ref 0–37)
Albumin: 3.8 g/dL (ref 3.5–5.2)
Alkaline Phosphatase: 69 U/L (ref 39–117)
BUN: 12 mg/dL (ref 6–23)
CO2: 23 mEq/L (ref 19–32)
Calcium: 8.8 mg/dL (ref 8.4–10.5)
Chloride: 109 mEq/L (ref 96–112)
Creatinine, Ser: 0.87 mg/dL (ref 0.40–1.20)
GFR: 90.6 mL/min (ref >60.00)
Glucose, Bld: 127 mg/dL — ABNORMAL HIGH (ref 70–99)
Potassium: 4.1 mEq/L (ref 3.5–5.1)
Sodium: 138 mEq/L (ref 135–145)
Total Bilirubin: 0.3 mg/dL (ref 0.2–1.2)
Total Protein: 6.2 g/dL (ref 6.0–8.3)

## 2021-05-23 LAB — IBC + FERRITIN
Ferritin: 157.1 ng/mL (ref 10.0–291.0)
Iron: 143 ug/dL (ref 42–145)
Saturation Ratios: 34.5 % (ref 20.0–50.0)
TIBC: 414.4 ug/dL (ref 250.0–450.0)
Transferrin: 296 mg/dL (ref 212.0–360.0)

## 2021-05-23 LAB — PROTIME-INR
INR: 1.1 ratio — ABNORMAL HIGH (ref 0.8–1.0)
Prothrombin Time: 12.1 s (ref 9.6–13.1)

## 2021-05-23 MED ORDER — TEMAZEPAM 15 MG PO CAPS
ORAL_CAPSULE | ORAL | 2 refills | Status: DC
  Filled 2021-05-23: qty 30, 30d supply, fill #0
  Filled 2021-06-21 – 2021-06-24 (×2): qty 30, 30d supply, fill #1
  Filled 2021-07-19: qty 30, 30d supply, fill #2

## 2021-05-23 MED ORDER — TEMAZEPAM 15 MG PO CAPS: 15.0000 mg | ORAL_CAPSULE | Freq: Every evening | ORAL | 2 refills | Status: DC | PRN

## 2021-05-23 MED ORDER — NADOLOL 20 MG PO TABS
20.0000 mg | ORAL_TABLET | Freq: Two times a day (BID) | ORAL | 3 refills | Status: DC
  Filled 2021-05-23: qty 60, 30d supply, fill #0
  Filled 2021-06-14: qty 60, 30d supply, fill #1
  Filled 2021-06-24: qty 60, 30d supply, fill #2
  Filled 2021-07-19: qty 60, 30d supply, fill #0
  Filled 2021-07-19: qty 60, 30d supply, fill #2
  Filled 2021-09-09: qty 60, 30d supply, fill #1

## 2021-05-23 MED ORDER — FAMOTIDINE 40 MG PO TABS
40.0000 mg | ORAL_TABLET | Freq: Two times a day (BID) | ORAL | 2 refills | Status: DC
  Filled 2021-05-23: qty 60, 30d supply, fill #0
  Filled 2021-06-14: qty 60, 30d supply, fill #1
  Filled 2021-07-19: qty 60, 30d supply, fill #0
  Filled 2021-07-19: qty 60, 30d supply, fill #2

## 2021-05-23 MED ORDER — SCOPOLAMINE 1 MG/3DAYS TD PT72
1.0000 | MEDICATED_PATCH | TRANSDERMAL | 0 refills | Status: DC
  Filled 2021-05-23: qty 10, 30d supply, fill #0

## 2021-05-23 NOTE — Progress Notes (Signed)
Subjective:    Patient ID: Linda Becker, female    DOB: 1990/09/24, 30 y.o.   MRN: [id number]  HPI Linda Becker is a 30 year old female with NASH cirrhosis with portal hypertension, history of variceal bleeding and prior band ligation, thrombocytopenia, obesity status post gastric sleeve in January 2021, iron deficiency anemia, GAVE,  history of colon polyps, chronic abdominal pain, chronic nausea, insomnia who is here for follow-up.  She is here alone today.  She was recently seen in the hospital setting on 04/29/2021 for EGD.  This was to valuate her portal hypertension but also iron deficiency anemia.  Very small distal esophageal varices with no stigmata as well as post banding scars were seen.  2 cm hiatal hernia.  Minimal gave with adherent heme in the antrum treated with APC.  She is also recently received her second dose of IV iron, last dose 05/20/2021.  She reports she continues to have upper abdominal pain in the epigastrium and left upper quadrant.  This is more severe after eating.  She was unable to get the sucralfate.  She has continue the pantoprazole 40 mg twice daily.  She has refractory nausea all the time with out frequent vomiting.  She is taking promethazine 25 mg 4 times a day.  She uses some Zofran as a backup but notes that this causes some palpitations so she tries to avoid it.  She is also had increased abdominal gas and bloating symptom.  Stools vary from loose to hard.  She has noticed that increased energy levels after IV iron.  She is also noticed worsening of her eyesight particularly with her up close vision which has become more blurry over the last 2 to 3 months.  Her periods have been less frequent occurring about every 8 weeks and lighter bleeding for only 2 to 3 days.  Trazodone and hydroxyzine were prescribed for sleep but ineffective.  It was also recommended that she stop this as they are limited by the liver.  She has a hard time initiating and staying  asleep.  She only sleeps 4 hours or less per night.  In the past she has tried Ativan which was helpful.  She is taking 50 mg of Benadryl at bedtime without any benefit.  Promethazine does not make her sleepy either.  She has been taking verapamil and nadolol.  She also is having itching and Dr. Loletha Grayer with [facility] prescribed ursodiol but this was not covered by her insurance.   Review of Systems As per HPI, otherwise negative  Current Medications, Allergies, Past Medical History, Past Surgical History, Family History and Social History were reviewed in [employer] record.    Objective:   Physical Exam BP (!) 100/56   Pulse (!) 118   Ht 5' 6"  (1.676 m)   Wt 298 lb 6 oz (135.3 kg)   LMP 05/05/2021   BMI 48.16 kg/m  Gen: awake, alert, NAD HEENT: anicteric, op clear CV: RRR, no mrg Pulm: CTA b/l Abd: soft, obese, epigastric and left-sided tenderness without rebound or guarding, positive bowel sounds  Ext: no c/c/e Neuro: nonfocal  CBC Latest Ref Rng & Units 05/23/2021 05/06/2021 05/02/2021  WBC 4.0 - 10.5 K/uL 5.0 6.2 5.5  Hemoglobin 12.0 - 15.0 g/dL 10.5(L) 10.2(L) 9.9(L)  Hematocrit 36.0 - 46.0 % 33.5(L) 34.8(L) 31.4(L)  Platelets 150.0 - 400.0 K/uL 82.0(L) 129(L) 120.0(L)    Lab Results  Component Value Date   INR 1.1 (H) 05/23/2021   INR 1.1 (  H) 01/25/2021   INR 1.2 (H) 06/22/2020   CMP     Component Value Date/Time   NA 138 05/23/2021 0935   NA 145 01/14/2020 1309   K 4.1 05/23/2021 0935   CL 109 05/23/2021 0935   CO2 23 05/23/2021 0935   GLUCOSE 127 (H) 05/23/2021 0935   BUN 12 05/23/2021 0935   BUN 12 01/14/2020 1309   CREATININE 0.87 05/23/2021 0935   CREATININE 0.84 02/07/2020 1412   CALCIUM 8.8 05/23/2021 0935   PROT 6.2 05/23/2021 0935   PROT 6.2 (L) 01/14/2020 1309   ALBUMIN 3.8 05/23/2021 0935   ALBUMIN 3.9 01/14/2020 1309   AST 15 05/23/2021 0935   AST 31 02/07/2020 1412   ALT 15 05/23/2021 0935   ALT 47 (H)  02/07/2020 1412   ALKPHOS 69 05/23/2021 0935   BILITOT 0.3 05/23/2021 0935   BILITOT 0.3 02/07/2020 1412   GFRNONAA >60 05/06/2021 1016   GFRNONAA >60 02/07/2020 1412   GFRAA >60 02/07/2020 1412        Assessment & Plan:  30 year old female with NASH cirrhosis with portal hypertension, history of variceal bleeding and prior band ligation, thrombocytopenia, obesity status post gastric sleeve in January 2021, iron deficiency anemia, GAVE,  history of colon polyps, chronic abdominal pain, chronic nausea, insomnia who is here for follow-up.   NASH cirrhosis with portal hypertension --she is also seeing Dr. Loletha Grayer with transplant hepatology at [facility].  The plan had been for her to lose weight before she would be a transplant candidate.  Fortunately right now her disease is better compensated and she is not an immediate need for transplant.  We discussed the various issues she has which relates to her cirrhosis and portal hypertension as follows: -- Esophageal varices --small and not amenable to additional band ligation at recent EGD, continue nadolol but increase to 40 mg daily as heart rate is not at goal; consider surveillance endoscopy at 12 months which would be November 2023 --No evidence of ascites or lower extremity edema --Mild encephalopathy in the past she will continue rifaximin 550 mg twice daily --HCC screening --CT scan with contrast done in September 2022 without concerning liver lesions; given the cross-sectional nature of this screening we can repeat in 12 months --She will continue contact with Dr. Loletha Grayer at [facility] --CBC, CMP and INR today  2.  Obesity --gastric sleeve did not result in significant weight loss.  She has been unsuccessful at losing weight through diet and exercise.  May eventually need to consider Roux-en-Y so that she can be a transplant candidate should this be needed in the future  3.  Severe nausea/history of reflux (GERD) --worse after gastric sleeve which is very  common.  This is despite pantoprazole 40 mg twice daily and famotidine 40 mg at night.  Nausea is refractory to promethazine 4 times a day and ondansetron.  She is also had some mild palpitations with ondansetron. --Continue pantoprazole 40 mg twice daily AC --Increase famotidine to 40 mg twice daily --Try scopolamine patch 1 mg change every 3 days --Can continue promethazine 25 mg every 6 hours as needed --Ondansetron for refractory nausea 4 mg every 6 hours as needed  4.  IDA/GAVE --recent IV iron, follow-up ferritin in about 8 weeks.  Stop oral iron as this worsened abdominal pain and constipation.  Check CBC today  5.  Chronic abdominal pain --unclear etiology not explained by endoscopic evaluation or cross-sectional imaging.  She has used narcotic pain medications in the past  which I have recommended we avoid.  She will hope to establish with a pain management clinic in the new year with her new insurance plan.  Previous referrals provided though we could not find a provider willing to see her with her current insurance --Abdominal x-ray today to rule out constipation  6.  Insomnia --refractory to Benadryl 50 to 75 mg at bedtime.  Trazodone and hydroxyzine ineffective and also will be avoided with her liver disease --Trial of Restoril 15 mg nightly; 30 day Rx -- Discuss this further with your primary care provider  7.  History of SSP of the colon --surveillance colonoscopy March 2025  Return to see me in 2 to 3 months  45 minutes total spent today including patient facing time, coordination of care, reviewing medical history/procedures/pertinent radiology studies, and documentation of the encounter.

## 2021-05-23 NOTE — Patient Instructions (Addendum)
Your provider has requested that you go to the basement level for lab work before leaving today. Press "B" on the elevator. The lab is located at the first door on the left as you exit the elevator.  Your provider has requested that you have an abdominal x ray before leaving today. Please go to the basement floor to our Radiology department for the test.  We have sent the following medications to your pharmacy for you to pick up at your convenience: Scopolamine, Restoril, Nadolol , Famotide   Increase your Nadolol to 32m daily.   Continue Pantoprazole  Twice daily.   Take Famotide 471mTwice daily.   STOP: Trazodone and Hydroxyzine  Continue to alternate Zofran and Promethazine as needed.   Please keep follow up in 8-10 weeks on: 08/09/21 at 9:10am   If you are age [age]r younger, your body mass index should be between 19-25. Your Body mass index is 48.16 kg/m. If this is out of the aformentioned range listed, please consider follow up with your Primary Care Provider.   ________________________________________________________  The [facility] providers would like to encourage you to use [facility] communicate with providers for non-urgent requests or questions.  Due to long hold times on the telephone, sending your provider a message by [facility] be a faster and more efficient way to get a response.  Please allow 48 business hours for a response.  Please remember that this is for non-urgent requests.  _______________________________________________________, Thank you for choosing me and [facility].  Dr. PyHilarie Fredrickson

## 2021-05-24 ENCOUNTER — Other Ambulatory Visit (HOSPITAL_COMMUNITY): Payer: Self-pay

## 2021-05-27 ENCOUNTER — Other Ambulatory Visit: Payer: Self-pay | Admitting: Internal Medicine

## 2021-05-27 ENCOUNTER — Encounter: Payer: Self-pay | Admitting: Internal Medicine

## 2021-05-27 ENCOUNTER — Encounter: Payer: Self-pay | Admitting: Allergy and Immunology

## 2021-05-27 ENCOUNTER — Other Ambulatory Visit: Payer: Self-pay | Admitting: Allergy and Immunology

## 2021-05-27 ENCOUNTER — Other Ambulatory Visit: Payer: Self-pay

## 2021-05-27 ENCOUNTER — Other Ambulatory Visit (HOSPITAL_COMMUNITY): Payer: Self-pay

## 2021-05-27 ENCOUNTER — Other Ambulatory Visit: Payer: Self-pay | Admitting: *Deleted

## 2021-05-27 MED ORDER — GABAPENTIN 100 MG PO CAPS
100.0000 mg | ORAL_CAPSULE | Freq: Every day | ORAL | 2 refills | Status: DC
  Filled 2021-05-27: qty 30, 30d supply, fill #0
  Filled 2021-06-14 – 2021-06-24 (×2): qty 30, 30d supply, fill #1
  Filled 2021-07-17 – 2021-07-19 (×2): qty 30, 30d supply, fill #0
  Filled ????-??-??: fill #0

## 2021-05-27 MED ORDER — ALBUTEROL SULFATE (2.5 MG/3ML) 0.083% IN NEBU
2.5000 mg | INHALATION_SOLUTION | RESPIRATORY_TRACT | 1 refills | Status: DC | PRN
  Filled 2021-05-27: qty 90, 5d supply, fill #0

## 2021-05-27 MED ORDER — ESCITALOPRAM OXALATE 10 MG PO TABS
ORAL_TABLET | ORAL | 1 refills | Status: DC
  Filled 2021-05-27: qty 30, 30d supply, fill #0
  Filled 2021-06-14 – 2021-06-24 (×2): qty 30, 30d supply, fill #1

## 2021-05-27 MED FILL — Ondansetron Orally Disintegrating Tab 8 MG: ORAL | 20 days supply | Qty: 60 | Fill #2 | Status: AC

## 2021-05-27 NOTE — Telephone Encounter (Signed)
Dr. [name],   Yes, agree with reducing verapamil to 120 mg daily and continue to monitor. Please let me know if you need anything further from me.   Best,  [employer]

## 2021-05-27 NOTE — Telephone Encounter (Addendum)
Order has been pended, It will not let me sign as I do not have security to approve this order. Will have Dr. Neldon Mc sign it in the morning then call patient when ready for pick up.

## 2021-05-27 NOTE — Telephone Encounter (Signed)
I increase the patient's nadolol at office visit last week due to persistent tachycardia and heart rate not being at goal for her history of portal hypertension and esophageal varices  She is also on verapamil under the direction of cardiology for supraventricular tachycardia  My recommendation would be that we continue the new dose of nadolol, 40 mg daily but reduce verapamil to 120 mg daily.  I will get cardiology's opinion as this medication was started recently after consult with cardiology -- CC: Lawerance Cruel, PAC with [facility]  Thanks [place]

## 2021-05-27 NOTE — Telephone Encounter (Signed)
-----   Message from Jiles Prows, MD sent at 05/27/2021 11:58 AM EST ----- Can we get Linda Becker a prescription for Tussinex as noted below?  We can give you Tussionex 2.5 - 5.0 mls at bedtime, which is a NARCOTIC, and needs to be used with the understanding that it can cause narcotic side effects. We can send in a 30 ml prescription with no refill. There is an issue with getting the prescription filled as the pharmacies require a signed prescription. I will have the staff work on getting you that prescription.

## 2021-05-28 ENCOUNTER — Encounter: Payer: Self-pay | Admitting: Internal Medicine

## 2021-05-28 ENCOUNTER — Other Ambulatory Visit (HOSPITAL_COMMUNITY): Payer: Self-pay

## 2021-05-28 ENCOUNTER — Other Ambulatory Visit: Payer: Self-pay | Admitting: Internal Medicine

## 2021-05-28 MED ORDER — SODIUM FLUORIDE 1.1 % DT PSTE
PASTE | DENTAL | 1 refills | Status: DC
  Filled 2021-05-28: qty 100, 30d supply, fill #0

## 2021-05-28 MED ORDER — HYDROCOD POLST-CPM POLST ER 10-8 MG/5ML PO SUER
ORAL | 0 refills | Status: DC
  Filled 2021-05-28: qty 30, 6d supply, fill #0

## 2021-05-28 MED ORDER — HYDROCOD POLST-CPM POLST ER 10-8 MG/5ML PO SUER
2.5000 mL | Freq: Every evening | ORAL | 0 refills | Status: DC | PRN
Start: 2021-05-28 — End: 2021-05-30

## 2021-05-28 NOTE — Telephone Encounter (Signed)
Dr. Neldon Mc can you sign the pended order.

## 2021-05-28 NOTE — Telephone Encounter (Signed)
Mychart message sent and patient made aware.

## 2021-05-29 ENCOUNTER — Encounter: Payer: Self-pay | Admitting: Internal Medicine

## 2021-05-29 ENCOUNTER — Other Ambulatory Visit (HOSPITAL_COMMUNITY): Payer: Self-pay

## 2021-05-30 ENCOUNTER — Ambulatory Visit (INDEPENDENT_AMBULATORY_CARE_PROVIDER_SITE_OTHER): Payer: 59 | Admitting: Allergy & Immunology

## 2021-05-30 ENCOUNTER — Other Ambulatory Visit: Payer: Self-pay

## 2021-05-30 ENCOUNTER — Other Ambulatory Visit (HOSPITAL_COMMUNITY): Payer: Self-pay

## 2021-05-30 ENCOUNTER — Encounter: Payer: Self-pay | Admitting: Allergy & Immunology

## 2021-05-30 VITALS — BP 140/72 | HR 83 | Temp 97.2°F | Resp 18 | Ht 66.0 in | Wt 300.2 lb

## 2021-05-30 DIAGNOSIS — J4551 Severe persistent asthma with (acute) exacerbation: Secondary | ICD-10-CM | POA: Diagnosis not present

## 2021-05-30 DIAGNOSIS — J0100 Acute maxillary sinusitis, unspecified: Secondary | ICD-10-CM

## 2021-05-30 MED ORDER — ALBUTEROL SULFATE (2.5 MG/3ML) 0.083% IN NEBU
2.5000 mg | INHALATION_SOLUTION | RESPIRATORY_TRACT | 1 refills | Status: DC | PRN
  Filled 2021-05-30: qty 90, 5d supply, fill #0

## 2021-05-30 MED ORDER — CEFDINIR 300 MG PO CAPS
300.0000 mg | ORAL_CAPSULE | Freq: Two times a day (BID) | ORAL | 0 refills | Status: DC
  Filled 2021-05-30: qty 28, 14d supply, fill #0

## 2021-05-30 MED ORDER — GUAIFENESIN-CODEINE 100-10 MG/5ML PO SYRP
ORAL_SOLUTION | ORAL | 0 refills | Status: DC
  Filled 2021-05-30: qty 120, 4d supply, fill #0

## 2021-05-30 MED ORDER — GUAIFENESIN-CODEINE 100-10 MG/5ML PO SOLN: 10.0000 mL | Freq: Three times a day (TID) | ORAL | 0 refills | Status: DC | PRN

## 2021-05-30 MED ORDER — METHYLPREDNISOLONE ACETATE 80 MG/ML IJ SUSP
80.0000 mg | Freq: Once | INTRAMUSCULAR | Status: AC
Start: 2021-05-30 — End: 2021-05-30
  Administered 2021-05-30: 80 mg via INTRAMUSCULAR

## 2021-05-30 NOTE — Patient Instructions (Addendum)
1. Severe persistent asthma with acute exacerbation - DepoMedrol 71m given in clinic today. - Start the prednisone pack provided today.  - Continue with your nebulizer one treatment every 4 hours during the day through the weekend.  - Continue with your AirDuo one puff twice daily.   2. Acute non-recurrent maxillary sinusitis - Start cefdinir 3069mtwice daily for two weeks. - This is cleared via the kidneys so it should be fine. - Continue with Mucinex.   3. Follow up as scheduled with Dr. KoNeldon Mcn January 2023.   Please inform usKoreaf any Emergency Department visits, hospitalizations, or changes in symptoms. Call usKoreaefore going to the ED for breathing or allergy symptoms since we might be able to fit you in for a sick visit. Feel free to contact usKoreanytime with any questions, problems, or concerns.  It was a pleasure to meet you today!  Websites that have reliable patient information: 1. American Academy of Asthma, Allergy, and Immunology: www.aaaai.org 2. Food Allergy Research and Education (FARE): foodallergy.org 3. Mothers of Asthmatics: http://www.asthmacommunitynetwork.org 4. American College of Allergy, Asthma, and Immunology: www.acaai.org   COVID-19 Vaccine Information can be found at: [email or web address] questions related to vaccine distribution or appointments, please email [email or web address] or call [phone]  We realize that you might be concerned about having an allergic reaction to the COVID19 vaccines. To help with that concern, WE ARE OFFERING THE COVID19 VACCINES IN OUR OFFICE! Ask the front desk for dates!     Like usKorean FaNational Citynd Instagram for our latest updates!      A healthy democracy works best when ALNew York Life Insurancearticipate! Make sure you are registered to vote! If you have moved or changed any of your contact information, you will need to get this updated before voting!  In some  cases, you MAY be able to register to vote online: [email or web address]

## 2021-05-30 NOTE — Progress Notes (Signed)
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 Severe persistent asthma with acute exacerbation - DepoMedrol 24m given in clinic today. - Start the prednisone pack provided today.  - Continue with your nebulizer one treatment every 4 hours during the day through the weekend.  - Continue with your AirDuo one puff twice daily.   2. Acute non-recurrent maxillary sinusitis - Start cefdinir 3075mtwice daily for two weeks. - This is cleared via the kidneys so it should be fine. - Continue with Mucinex.   3. Follow up as scheduled with Dr. KoNeldon Mcn January 2023.    Subjective:   Linda ROSELLIs a [age].o. 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/26/2020   Idiopathic esophageal varices with bleeding ([facility]   Hematemesis 01/27/2020   Liver cirrhosis secondary to NASH ([facility]08/13/2021   Acute GI bleeding 01/27/2020   Dehydration 07/22/2019   Portal hypertension (HCC)    Chronic diarrhea    Benign neoplasm of ascending colon    Benign neoplasm of rectum    Thrombocytopenia ([facility]02/27/2020   Spontaneous vaginal delivery 12/22/2017   IUFD at 2065eeks or more of gestation 12/20/2017   Snoring 11/10/2017   Gestational diabetes 10/12/2017   Morbid obesity ([facility]12/09/2016   Adult  hypothyroidism 05/19/2017   BMI 40.0-44.9, adult ([facility]05/26/2017   GERD (gastroesophageal reflux disease)    Tachycardia    Syncope    Allergic rhinitis    Persistent asthma with undetermined severity    Migraine with aura 09/26/2011    History obtained from: chart review and patient.  Linda Becker a [age].o. female presenting for a sick visit.  She has a history of severe persistent asthma and is followed with Dr. KoNeldon Mcince age 33 62r 5.16 She last saw him a week ago.  She recently had gotten insurance again after being uninsured for nearly all of 2022.  He started her on AirDuo 232 mcg 1 puff twice daily as well as Rhinocort 1 spray per nostril daily.  She has been on biologics in the past, most recently Dupixent.  However, she had to stop it because she lost her insurance.   Since last visit, she has contracted an illness. She started having issues around a week ago or so. She did get cough medicine from Dr. K Raliegh Ipnd it did not work. She cannot have Dayquil or Nyquil. She denies sick contacts. She normally gets this kind of thing during this time of the year. She has a history of allergic rhinitis. This year her immune system has been challenging, which she attributes to the worsening liver failure.  She did do a COVID test 2 days ago and this was negative.  At the time of the COVID test, she has  been sick for over a week.  She has been coughing a lot with sinus issues. She is doing her AirDuo one puff BID and has been using her nebulizer.  She does need a refill of her nebulizer solution.  She also is requesting a cough medicine.  She tends to get these illnesses around once per year this time of the year.  She last had an antibiotic to treat a urinary tract infection.  She has not had prednisone in quite some time.  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 She has applied for Medicaid but this was denied due to a paperwork  issue. She cannot work. 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   Otherwise, there have been no changes to her past medical history, surgical history, family history, or social history.    Review of Systems  Constitutional: Negative.  Negative for chills, fever, malaise/fatigue and weight loss.  HENT:  Positive for congestion and sinus pain. Negative for ear discharge and ear pain.   Eyes:  Negative for pain, discharge and redness.  Respiratory:  Positive for cough and sputum production. Negative for shortness of breath and wheezing.   Cardiovascular: Negative.  Negative for chest pain and palpitations.  Gastrointestinal:  Negative for abdominal pain, constipation, diarrhea, heartburn, nausea and vomiting.  Skin: Negative.  Negative for itching and rash.  Neurological:  Negative for dizziness and headaches.  Endo/Heme/Allergies:  Positive for environmental allergies. Does not bruise/bleed easily.      Objective:   Blood pressure 140/72, pulse 83, temperature (!) 97.2 F (36.2 C), resp. rate 18, height 5' 6"  (1.676 m), weight (!) 300 lb 3.2 oz (136.2 kg), last menstrual period 05/05/2021, SpO2 98 %. Body mass index is 48.45 kg/m.   Physical Exam:  Physical Exam Vitals reviewed.  Constitutional:      Appearance: She is well-developed. She is ill-appearing.     Comments: Talking in short sentences.   HENT:     Head: Normocephalic and atraumatic.     Right Ear: Tympanic membrane, ear canal and external ear normal.     Left Ear: Tympanic membrane, ear canal and external ear normal.     Nose: Mucosal edema and rhinorrhea present. No nasal deformity or septal deviation.     Right Turbinates: Enlarged, swollen and pale.     Left Turbinates: Enlarged, swollen and pale.     Right Sinus: No maxillary sinus tenderness or frontal sinus tenderness.     Left Sinus: No maxillary sinus tenderness or frontal sinus tenderness.     Mouth/Throat:     Mouth:  Mucous membranes are not pale and not dry.     Pharynx: Uvula midline.  Eyes:     General: Lids are normal. No allergic shiner.       Right eye: No discharge.        Left eye: No discharge.     Conjunctiva/sclera: Conjunctivae normal.     Right eye: Right conjunctiva is not injected. No chemosis.    Left eye: Left conjunctiva is not injected. No chemosis.    Pupils: Pupils are equal, round, and reactive to light.  Cardiovascular:     Rate and Rhythm: Normal rate and regular rhythm.     Heart sounds: Normal heart sounds.  Pulmonary:     Effort: Tachypnea and accessory muscle usage present. No respiratory distress.  Breath sounds: Wheezing present. No rhonchi or rales.     Comments: Decreased air movement at the bases.  Diffuse wheezing bilaterally. Chest:     Chest wall: No tenderness.  Lymphadenopathy:     Cervical: No cervical adenopathy.  Skin:    Coloration: Skin is not pale.     Findings: No abrasion, erythema, petechiae or rash. Rash is not papular, urticarial or vesicular.  Neurological:     Mental Status: She is alert.  Psychiatric:        Behavior: Behavior is cooperative.     Diagnostic studies: none  DepoMedrol 50m administered in clinic       JSalvatore Marvel MD  [facility]

## 2021-05-31 ENCOUNTER — Other Ambulatory Visit (HOSPITAL_COMMUNITY): Payer: Self-pay

## 2021-05-31 ENCOUNTER — Encounter: Payer: Self-pay | Admitting: Allergy & Immunology

## 2021-05-31 ENCOUNTER — Other Ambulatory Visit: Payer: Self-pay | Admitting: Internal Medicine

## 2021-05-31 ENCOUNTER — Other Ambulatory Visit: Payer: Self-pay | Admitting: Endocrinology

## 2021-05-31 DIAGNOSIS — E039 Hypothyroidism, unspecified: Secondary | ICD-10-CM

## 2021-05-31 DIAGNOSIS — Z131 Encounter for screening for diabetes mellitus: Secondary | ICD-10-CM

## 2021-05-31 MED ORDER — PROMETHAZINE HCL 25 MG PO TABS
25.0000 mg | ORAL_TABLET | Freq: Four times a day (QID) | ORAL | 0 refills | Status: DC | PRN
Start: 2021-05-31 — End: 2021-06-22
  Filled 2021-05-31 – 2021-06-04 (×3): qty 120, 30d supply, fill #0

## 2021-06-02 ENCOUNTER — Other Ambulatory Visit: Payer: Self-pay | Admitting: Internal Medicine

## 2021-06-02 MED ORDER — HYDROCOD POLST-CPM POLST ER 10-8 MG/5ML PO SUER: ORAL | 0 refills | Status: DC

## 2021-06-02 MED ORDER — BUDESONIDE 0.25 MG/2ML IN SUSP: 0.2500 mg | Freq: Two times a day (BID) | RESPIRATORY_TRACT | 1 refills | Status: DC

## 2021-06-02 NOTE — Progress Notes (Signed)
Patient called to request refill of Tussinex cough syrup prescribed by Dr. Neldon Mc on 12/13.  She was prescribed codeine-guanfisen (120 mL) by Dr. Ernst Bowler on 12/15 in addition to cephalosporin antibiotics, IM depomedrol and 5 day steroid burst.  She reports most recent cough syrup as ineffective and requesting refill of previous.  She is having issues getting to sleep due to coughing.  Agreed to give her 15 mL of Tussionex to be used 2.5-64m prior to bed as needed. No refills. Reviewed the dangers of narcotic medications.  Added pulmicort 0.25 nebs to be used twice daily following AirDuo inhaler until symptoms improved.    She was instructed to call next week and request follow-up if no improvement.

## 2021-06-03 ENCOUNTER — Other Ambulatory Visit (HOSPITAL_COMMUNITY): Payer: Self-pay

## 2021-06-03 ENCOUNTER — Other Ambulatory Visit (INDEPENDENT_AMBULATORY_CARE_PROVIDER_SITE_OTHER): Payer: 59

## 2021-06-03 DIAGNOSIS — E039 Hypothyroidism, unspecified: Secondary | ICD-10-CM | POA: Diagnosis not present

## 2021-06-03 DIAGNOSIS — Z131 Encounter for screening for diabetes mellitus: Secondary | ICD-10-CM | POA: Diagnosis not present

## 2021-06-03 LAB — HEMOGLOBIN A1C: Hgb A1c MFr Bld: 4.3 % — ABNORMAL LOW (ref 4.6–6.5)

## 2021-06-03 LAB — TSH: TSH: 1.05 u[IU]/mL (ref 0.35–5.50)

## 2021-06-03 LAB — T4, FREE: Free T4: 0.69 ng/dL (ref 0.60–1.60)

## 2021-06-03 LAB — GLUCOSE, RANDOM: Glucose, Bld: 96 mg/dL (ref 70–99)

## 2021-06-03 NOTE — Telephone Encounter (Signed)
Patient called about her MyChart message she sent this morning. She spoke with Dr. Simona Huh over night and was prescribed tussinex. She said the tussinex does help and wants to know if she can take it during the day and if so, she will need more of it. She has lost her voice and is still coughing. She wants to know what more she can do for relief.

## 2021-06-03 NOTE — Telephone Encounter (Signed)
Hi Linda Becker,  I do not feel comfortable prescribing this medication.  I gave her a few refills over the weekend to help at night.  Dr. Novella Rob gave her 120 mL of a codeine cough syrup last week, and Dr. Neldon Mc had given her the initial medication last Monday I believe.   She also has been given steroids and antibiotics.    I do not feel comfortable refilling this.

## 2021-06-04 ENCOUNTER — Telehealth: Payer: Self-pay | Admitting: *Deleted

## 2021-06-04 ENCOUNTER — Encounter: Payer: Self-pay | Admitting: Allergy and Immunology

## 2021-06-04 ENCOUNTER — Other Ambulatory Visit: Payer: Self-pay

## 2021-06-04 ENCOUNTER — Ambulatory Visit: Payer: 59 | Admitting: Family Medicine

## 2021-06-04 ENCOUNTER — Other Ambulatory Visit (HOSPITAL_COMMUNITY): Payer: Self-pay

## 2021-06-04 ENCOUNTER — Ambulatory Visit (INDEPENDENT_AMBULATORY_CARE_PROVIDER_SITE_OTHER): Payer: 59 | Admitting: Allergy and Immunology

## 2021-06-04 VITALS — BP 138/70 | HR 82 | Temp 98.3°F | Resp 18

## 2021-06-04 DIAGNOSIS — J4551 Severe persistent asthma with (acute) exacerbation: Secondary | ICD-10-CM

## 2021-06-04 DIAGNOSIS — K219 Gastro-esophageal reflux disease without esophagitis: Secondary | ICD-10-CM | POA: Diagnosis not present

## 2021-06-04 DIAGNOSIS — J4540 Moderate persistent asthma, uncomplicated: Secondary | ICD-10-CM | POA: Diagnosis not present

## 2021-06-04 DIAGNOSIS — J3089 Other allergic rhinitis: Secondary | ICD-10-CM | POA: Diagnosis not present

## 2021-06-04 MED ORDER — HYDROCOD POLST-CPM POLST ER 10-8 MG/5ML PO SUER: ORAL | 0 refills | Status: DC

## 2021-06-04 MED ORDER — HYDROCOD POLST-CPM POLST ER 10-8 MG/5ML PO SUER
ORAL | 0 refills | Status: DC
  Filled 2021-06-04: qty 30, 6d supply, fill #0

## 2021-06-04 MED ORDER — DUPILUMAB 300 MG/2ML ~~LOC~~ SOSY
600.0000 mg | PREFILLED_SYRINGE | Freq: Once | SUBCUTANEOUS | Status: AC
Start: 2021-06-04 — End: 2021-06-04
  Administered 2021-06-04: 14:00:00 600 mg via SUBCUTANEOUS

## 2021-06-04 NOTE — Progress Notes (Signed)
[place] [facility] [place] [facility] [place]   Follow-up Note  Referring Provider: Deon Pilling, NP Primary Provider: Deon Pilling, NP Date of Office Visit: 06/04/2021  Subjective:   Linda Becker (DOB: [birth year]) is a 30 y.o. female who returns to the [facility] [place] on 06/04/2021 in re-evaluation of the following:  HPI: Linda Becker returns to this clinic in evaluation of respiratory tract problems.  I last saw her in this clinic on 21 May 2021 at which point in time she appeared to be having problems with inflammation of both her upper and lower airway for which we restarted her on a collection of anti-inflammatory agents.  She has actually done worse since that point in time and subsequently saw Dr. Ernst Bowler on 30 May 2021 who treated her with an injection of Depo-Medrol, oral steroids, nebulized budesonide, cefdinir, for respiratory tract inflammation and infection.  She still continues with unrelenting coughing.  Her throat hurts and she has a squeaky voice.  She has sternal chest pain and her rib cage hurts from coughing.  She was given a prescription for Tussionex which helps somewhat in regard to sleeping at night.  She does not have any anosmia or ugly nasal discharge yet she still a bit congested in her nose.  She is making some sputum production that is small little "pellets" but she is not having any blood.  Allergies as of 06/04/2021       Reactions   Progesterone Rash   Was in a form of birth control.   Penicillins Rash   Did it involve swelling of the face/tongue/throat, SOB, or low BP? No Did it involve sudden or severe rash/hives, skin peeling, or any reaction on the inside of your mouth or nose? Yes Did you need to seek medical attention at a hospital or doctor's office? No When did it last happen?      9 + months If all above answers are NO, may proceed with cephalosporin use.        Medication List    AirDuo Digihaler  232-14 MCG/ACT Aepb Generic drug: Fluticasone-Salmeterol(sensor) Inhale 1 puff into the lungs in the morning and at bedtime.   albuterol (2.5 MG/3ML) 0.083% nebulizer solution Commonly known as: PROVENTIL Inhale 1 ampule by nebulization every 4 (four) hours as needed for wheezing or shortness of breath.   budesonide 0.25 MG/2ML nebulizer solution Commonly known as: Pulmicort Take 2 mLs (0.25 mg total) by nebulization 2 (two) times daily.   budesonide 32 MCG/ACT nasal spray Commonly known as: RHINOCORT AQUA Place 1 spray into both nostrils daily.   CALCIUM PO Take 1 tablet by mouth 2 (two) times daily.   cefdinir 300 MG capsule Commonly known as: OMNICEF Take 1 capsule (300 mg total) by mouth 2 (two) times daily for 14 days.   Cetirizine HCl 10 MG Caps Take 10 mg by mouth 2 (two) times daily.   chlorpheniramine-HYDROcodone 10-8 MG/5ML Suer Commonly known as: TUSSIONEX Take 2.5 - 30ms by mouth at bedtime as needed for cough.   dextromethorphan-guaiFENesin 30-600 MG 12hr tablet Commonly known as: MUCINEX DM Take 1 tablet by mouth 2 (two) times daily.   diphenhydrAMINE 25 MG tablet Commonly known as: BENADRYL Take 50 mg by mouth at bedtime as needed for allergies or sleep.   escitalopram 10 MG tablet Commonly known as: Lexapro Take 1 tablet by mouth once daily   famotidine 40 MG tablet Commonly known as: Pepcid Take 1 tablet (40 mg total)  by mouth 2 (two) times daily.   gabapentin 100 MG capsule Commonly known as: NEURONTIN Take 1 capsule (100 mg total) by mouth daily.   loratadine 10 MG tablet Commonly known as: Claritin Take 1 tablet by mouth 2 times daily as needed for allergies.   multivitamin-prenatal 27-0.8 MG Tabs tablet Take 1 tablet by mouth 2 (two) times daily.   nadolol 20 MG tablet Commonly known as: CORGARD Take 1 tablet (20 mg total) by mouth 2 (two) times daily.   Olopatadine HCl 0.2 % Soln Can use one drop in each eye once daily if needed.    ondansetron 8 MG disintegrating tablet Commonly known as: ZOFRAN-ODT DISSOLVE 1 TABLET BY MOUTH EVERY 8 HOURS AS NEEDED FOR NAUSEA OR VOMITING   pantoprazole 40 MG tablet Commonly known as: PROTONIX Take 1 tablet (40 mg total) by mouth 2 (two) times daily before a meal.   ProAir Digihaler 108 (90 Base) MCG/ACT Aepb Generic drug: Albuterol Sulfate (sensor) Inhale 2 puffs into the lungs every 4 hours as needed (For coughing and wheezing).   promethazine 25 MG tablet Commonly known as: PHENERGAN Take 1 tablet by mouth every 6 hours as needed for vomiting or nausea.   scopolamine 1 MG/3DAYS Commonly known as: TRANSDERM-SCOP Place 1 patch (1.5 mg total) onto the skin every 3 (three) days.   Sodium Fluoride 1.1 % Pste Brush at bedtime (Do not rinse)   sucralfate 1 GM/10ML suspension Commonly known as: CARAFATE Take 10 mLs by mouth 4 times daily -  with meals and at bedtime.   temazepam 15 MG capsule Commonly known as: RESTORIL Take 1 capsule (15 mg total) by mouth at bedtime as needed for sleep.   temazepam 15 MG capsule Commonly known as: RESTORIL Take 1 capsule by mouth at bedtime as needed for sleep   ursodiol 300 MG capsule Commonly known as: ACTIGALL Take 1 capsule (300 mg total) by mouth 2 (two) times daily   verapamil 180 MG CR tablet Commonly known as: CALAN-SR Take 1 tablet (180 mg total) by mouth at bedtime.   vitamin C 1000 MG tablet Take 2,000 mg by mouth 2 (two) times daily.   VITAMIN D PO Take 1 capsule by mouth 2 (two) times daily.   VITAMIN E PO Take 1 capsule by mouth daily.    Past Medical History:  Diagnosis Date   Allergic rhinitis    remote hx of allergy shots   Asthma    since childhood  on controller meds extrinsic dr Carmelina Peal   Bipolar II disorder (HCC)    Cirrhosis ([place])    Esophageal varices ([place])    Family history of adverse reaction to anesthesia    mom and sister difficult to awaken per patient    Gallbladder sludge     Gastropathy    GERD (gastroesophageal reflux disease)    on nexium  for  long term sx since childhood   H/O miscarriage, not currently pregnant    [redacted] weeks  march 2016   Hepatic steatosis    Hiatal hernia    Hypothyroidism    Migraine    Murmur    pt reports MVP   Portal hypertension ([place])    Sessile colonic polyp    Splenomegaly    Steatohepatitis    Syncope    under eval ? cause   Tachycardia    episodes with near syncope eval dr Nadyne Coombes  on no meds dced LABA   Thrombocytopenia ([place])    Upper GI  bleed     Past Surgical History:  Procedure Laterality Date   BIOPSY  09/03/2018   Procedure: BIOPSY;  Surgeon: Jerene Bears, MD;  Location: [facility];  Service: Gastroenterology;;   BIOPSY  09/26/2020   Procedure: BIOPSY;  Surgeon: Jerene Bears, MD;  Location: [facility];  Service: Gastroenterology;;   broken right femur  2008   rod placed   COLONOSCOPY WITH PROPOFOL N/A 09/03/2018   Procedure: COLONOSCOPY WITH PROPOFOL;  Surgeon: Jerene Bears, MD;  Location: [facility];  Service: Gastroenterology;  Laterality: N/A;   DILATION AND CURETTAGE OF UTERUS     ESOPHAGOGASTRODUODENOSCOPY (EGD) WITH PROPOFOL N/A 09/03/2018   Procedure: ESOPHAGOGASTRODUODENOSCOPY (EGD) WITH PROPOFOL;  Surgeon: Jerene Bears, MD;  Location: [facility];  Service: Gastroenterology;  Laterality: N/A;   ESOPHAGOGASTRODUODENOSCOPY (EGD) WITH PROPOFOL N/A 01/27/2020   Procedure: ESOPHAGOGASTRODUODENOSCOPY (EGD) WITH PROPOFOL;  Surgeon: Rush Landmark Telford Nab., MD;  Location: [facility];  Service: Gastroenterology;  Laterality: N/A;   ESOPHAGOGASTRODUODENOSCOPY (EGD) WITH PROPOFOL N/A 03/12/2020   Procedure: ESOPHAGOGASTRODUODENOSCOPY (EGD) WITH PROPOFOL;  Surgeon: Jerene Bears, MD;  [facility]: [facility];  Service: Gastroenterology;  Laterality: N/A;   ESOPHAGOGASTRODUODENOSCOPY (EGD) WITH PROPOFOL N/A 09/26/2020   Procedure: ESOPHAGOGASTRODUODENOSCOPY (EGD) WITH PROPOFOL;  Surgeon: Jerene Bears, MD;   [facility];  Service: Gastroenterology;  Laterality: N/A;   ESOPHAGOGASTRODUODENOSCOPY (EGD) WITH PROPOFOL N/A 04/29/2021   Procedure: ESOPHAGOGASTRODUODENOSCOPY (EGD) WITH PROPOFOL;  Surgeon: Jerene Bears, MD;  Location: [facility];  Service: Gastroenterology;  Laterality: N/A;   GASTRIC VARICES BANDING  01/27/2020   Procedure: GASTRIC VARICES BANDING;  Surgeon: Rush Landmark Telford Nab., MD;  Location: [facility];  Service: Gastroenterology;;   HOT HEMOSTASIS N/A 04/29/2021   Procedure: HOT HEMOSTASIS (ARGON PLASMA COAGULATION/BICAP);  Surgeon: Jerene Bears, MD;  Location[facility];  Service: Gastroenterology;  Laterality: N/A;   IR TRANSCATHETER BX  01/19/2019   IR US GUIDE VASC ACCESS RIGHT  01/19/2019   IR VENOGRAM HEPATIC W HEMODYNAMIC EVALUATION  01/19/2019   LAPAROSCOPIC GASTRIC SLEEVE RESECTION N/A 06/28/2019   Procedure: LAPAROSCOPIC GASTRIC SLEEVE RESECTION, Upper Endo, ERAS Pathway;  Surgeon: Clovis Riley, MD;  Location: [facility];  Service: General;  Laterality: N/A;   OB ultrasound N/A 12/01/2017   see report   POLYPECTOMY  09/03/2018   Procedure: POLYPECTOMY;  Surgeon: Jerene Bears, MD;  Location: [facility];  Service: Gastroenterology;;   TONSILLECTOMY  2006   WISDOM TOOTH EXTRACTION      Review of systems negative except as noted in HPI / PMHx or noted below:  Review of Systems  Constitutional: Negative.   HENT: Negative.    Eyes: Negative.   Respiratory: Negative.    Cardiovascular: Negative.   Gastrointestinal: Negative.   Genitourinary: Negative.   Musculoskeletal: Negative.   Skin: Negative.   Neurological: Negative.   Endo/Heme/Allergies: Negative.   Psychiatric/Behavioral: Negative.      Objective:   Vitals:   06/04/21 1353  BP: 138/70  Pulse: 82  Resp: 18  Temp: 98.3 F (36.8 C)  SpO2: 100%          Physical Exam Constitutional:      Appearance: She is not diaphoretic.     Comments: Oscillating squeaky voice, cough  HENT:      Head: Normocephalic.     Right Ear: Tympanic membrane, ear canal and external ear normal.     Left Ear: Tympanic membrane, ear canal and external ear normal.     Nose: Nose normal. No  mucosal edema or rhinorrhea.     Mouth/Throat:     Pharynx: Uvula midline. No oropharyngeal exudate.  Eyes:     Conjunctiva/sclera: Conjunctivae normal.  Neck:     Thyroid: No thyromegaly.     Trachea: Trachea normal. No tracheal tenderness or tracheal deviation.  Cardiovascular:     Rate and Rhythm: Normal rate and regular rhythm.     Heart sounds: Normal heart sounds, S1 normal and S2 normal. No murmur heard. Pulmonary:     Effort: No respiratory distress.     Breath sounds: Normal breath sounds. No stridor. No wheezing (End expiratory wheezing, laryngeal wheezing) or rales.  Lymphadenopathy:     Head:     Right side of head: No tonsillar adenopathy.     Left side of head: No tonsillar adenopathy.     Cervical: No cervical adenopathy.  Skin:    Findings: No erythema or rash.     Nails: There is no clubbing.  Neurological:     Mental Status: She is alert.    Diagnostics:    Spirometry was performed and demonstrated an FEV1 of 2.09 at 61 % of predicted.  Assessment and Plan:   1. Severe persistent asthma with exacerbation   2. Perennial allergic rhinitis   3. Gastroesophageal reflux disease, unspecified whether esophagitis present     1.  Treat and prevent inflammation:  A. Airduo 232 - 1 inhalation 2 times per day (specialty pharmacy) B. OTC Budesonide - 1 spray each nostril 2 time per day C. Restart dupilumab injection today and every 2 weeks  2.  Treat and prevent reflux/LPR:   A. Consolidate all caffeine consumption   B.  Famotidine 40 mg - 1 tablet twice a day  3.  If needed:  A. Proair Digihaler - 2 inhalations or every 4-6 hours (specialty pharmacy) B. Antihistamine - loratadine / cetirizine C. Budesonide/albuterol nebulization every 4-6 hours D. Tussionex -2.5-5.0 mL  every 12 hours for cough. 30 mls, NARCOTIC  4. Return to clinic in 4 weeks or earlier if problem   Linda Becker will restart her dupilumab given the fact that she has lost control of her respiratory tract inflammatory state in the face of very aggressive therapy including receiving a fair amount of systemic steroids over the course of the past few weeks and using a broad-spectrum antibiotic.  We will see her back in this clinic in 4 weeks or earlier if there is a problem.  Allena Katz, MD Allergy / Immunology [facility]

## 2021-06-04 NOTE — Telephone Encounter (Signed)
-----   Message from Herbie Drape, LPN sent at [phone]  2:02 PM EST ----- Patient has restarted Dupixent for her asthma and received a sample loading dose today in clinic.

## 2021-06-04 NOTE — Patient Instructions (Addendum)
°  1.  Treat and prevent inflammation:  A. Airduo 232 - 1 inhalation 2 times per day (specialty pharmacy) B. OTC Budesonide - 1 spray each nostril 2 time per day C. Restart dupilumab injection today and every 2 weeks  2.  Treat and prevent reflux/LPR:   A. Consolidate all caffeine consumption   B.  Famotidine 40 mg - 1 tablet twice a day  3.  If needed:  A. Proair Digihaler - 2 inhalations every 4-6 hours (specialty pharmacy) B. Antihistamine - loratadine / cetirizine C. Budesonide/albuterol nebulization every 4-6 hours D. Tussionex -2.5-5.0 mL every 12 hours for cough. 30 mls, NARCOTIC  4. Return to clinic in 4 weeks or earlier if problem

## 2021-06-04 NOTE — Telephone Encounter (Signed)
L/m for patient to contact me regarding Dupixent restart.  Since her Ins ends 12/31 she will need to reach out to me with new Ins info in order to get her approval and resubmitted

## 2021-06-05 ENCOUNTER — Ambulatory Visit (INDEPENDENT_AMBULATORY_CARE_PROVIDER_SITE_OTHER): Payer: 59 | Admitting: Endocrinology

## 2021-06-05 ENCOUNTER — Encounter: Payer: Self-pay | Admitting: Endocrinology

## 2021-06-05 ENCOUNTER — Encounter: Payer: Self-pay | Admitting: Allergy and Immunology

## 2021-06-05 VITALS — BP 92/70 | HR 73 | Ht 66.0 in | Wt 301.6 lb

## 2021-06-05 DIAGNOSIS — R42 Dizziness and giddiness: Secondary | ICD-10-CM

## 2021-06-05 DIAGNOSIS — R5383 Other fatigue: Secondary | ICD-10-CM

## 2021-06-05 NOTE — Progress Notes (Signed)
Patient ID: Linda Becker, female   DOB: 1990/11/18, 30 y.o.   MRN: [id number]             Reason for Appointment: Follow-up of thyroid and other issues    History of Present Illness:    PREVIOUS history:  Hypothyroidism was first diagnosed in 10/2016 At the time of diagnosis patient was having symptoms of  fatigue, cold sensitivity, weight gain, brittle nails and hair loss .          However her symptoms had been present for several years and not any worse She was tested with her TSH because of needing to start lithium therapy Baseline TSH was 6.7, no baseline free T4 or free T3 available  Prior to consultation she had been treated with  liothyronine, initially 25 g daily and then 50 g by her psychiatrist for management of her high TSH The patient did not subjectively feeling any better with taking the liothyronine; with this she was having palpitations, shortness of breath and feeling excessively warm and sweaty. Because of her symptoms she was told to stop the liothyronine and have baseline labs done again  RECENT history:  She was last seen in 2019 in early pregnancy when TSH was 5.2 Not clear if she was treated by her gynecologist for this but subsequently has not been on any supplements  She complains of significant fatigue for several months Also has had recent weight gain She has been referred here because of history of rapid heart rate for the last several months  However her TSH is normal along with free T4          Patient's weight history is as follows:  Wt Readings from Last 3 Encounters:  06/05/21 (!) 301 lb 9.6 oz (136.8 kg)  05/30/21 (!) 300 lb 3.2 oz (136.2 kg)  05/23/21 298 lb 6 oz (135.3 kg)    TPO antibody previously negative  Thyroid function results have been as follows:  Lab Results  Component Value Date   TSH 1.05 06/03/2021   TSH 0.72 05/01/2020   TSH 1.87 09/27/2019   TSH 1.59 03/11/2019   FREET4 0.69 06/03/2021   FREET4 0.73  09/27/2019   FREET4 0.99 05/26/2018   FREET4 0.95 02/10/2018   T3FREE 3.4 09/27/2019   T3FREE 3.7 12/18/2017   T3FREE 3.5 06/29/2017    02/05/17: Free thyroxine index low at 1.0  Other active problems discussed today: See review of systems   Past Medical History:  Diagnosis Date   Allergic rhinitis    remote hx of allergy shots   Asthma    since childhood  on controller meds extrinsic dr Carmelina Peal   Bipolar II disorder ([place])    Cirrhosis ([place])    Esophageal varices ([place])    Family history of adverse reaction to anesthesia    mom and sister difficult to awaken per patient    Gallbladder sludge    Gastropathy    GERD (gastroesophageal reflux disease)    on nexium  for  long term sx since childhood   H/O miscarriage, not currently pregnant    [redacted] weeks  march 2016   Hepatic steatosis    Hiatal hernia    Hypothyroidism    Migraine    Murmur    pt reports MVP   Portal hypertension ([place])    Sessile colonic polyp    Splenomegaly    Steatohepatitis    Syncope    under eval ? cause   Tachycardia  episodes with near syncope eval dr Nadyne Coombes  on no meds dced LABA   Thrombocytopenia ([place])    Upper GI bleed     Past Surgical History:  Procedure Laterality Date   BIOPSY  09/03/2018   Procedure: BIOPSY;  Surgeon: Jerene Bears, MD;  Location: [facility];  Service: Gastroenterology;;   BIOPSY  09/26/2020   Procedure: BIOPSY;  Surgeon: Jerene Bears, MD;  Location: [facility];  Service: Gastroenterology;;   broken right femur  2008   rod placed   COLONOSCOPY WITH PROPOFOL N/A 09/03/2018   Procedure: COLONOSCOPY WITH PROPOFOL;  Surgeon: Jerene Bears, MD;  Location: [facility];  Service: Gastroenterology;  Laterality: N/A;   DILATION AND CURETTAGE OF UTERUS     ESOPHAGOGASTRODUODENOSCOPY (EGD) WITH PROPOFOL N/A 09/03/2018   Procedure: ESOPHAGOGASTRODUODENOSCOPY (EGD) WITH PROPOFOL;  Surgeon: Jerene Bears, MD;  Location: [facility];  Service: Gastroenterology;  Laterality: N/A;    ESOPHAGOGASTRODUODENOSCOPY (EGD) WITH PROPOFOL N/A 01/27/2020   Procedure: ESOPHAGOGASTRODUODENOSCOPY (EGD) WITH PROPOFOL;  Surgeon: Rush Landmark Telford Nab., MD;  Location: [place];  Service: Gastroenterology;  Laterality: N/A;   ESOPHAGOGASTRODUODENOSCOPY (EGD) WITH PROPOFOL N/A 03/12/2020   Procedure: ESOPHAGOGASTRODUODENOSCOPY (EGD) WITH PROPOFOL;  Surgeon: Jerene Bears, MD;  [facility]: [facility];  Service: Gastroenterology;  Laterality: N/A;   ESOPHAGOGASTRODUODENOSCOPY (EGD) WITH PROPOFOL N/A 09/26/2020   Procedure: ESOPHAGOGASTRODUODENOSCOPY (EGD) WITH PROPOFOL;  Surgeon: Jerene Bears, MD;  [facility]: [facility];  Service: Gastroenterology;  Laterality: N/A;   ESOPHAGOGASTRODUODENOSCOPY (EGD) WITH PROPOFOL N/A 04/29/2021   Procedure: ESOPHAGOGASTRODUODENOSCOPY (EGD) WITH PROPOFOL;  Surgeon: Jerene Bears, MD;  Location: [facility];  Service: Gastroenterology;  Laterality: N/A;   GASTRIC VARICES BANDING  01/27/2020   Procedure: GASTRIC VARICES BANDING;  Surgeon: Rush Landmark Telford Nab., MD;  Location: [place];  Service: Gastroenterology;;   HOT HEMOSTASIS N/A 04/29/2021   Procedure: HOT HEMOSTASIS (ARGON PLASMA COAGULATION/BICAP);  Surgeon: Jerene Bears, MD;  Location: [facility];  Service: Gastroenterology;  Laterality: N/A;   IR TRANSCATHETER BX  01/19/2019   IR US GUIDE VASC ACCESS RIGHT  01/19/2019   IR VENOGRAM HEPATIC W HEMODYNAMIC EVALUATION  01/19/2019   LAPAROSCOPIC GASTRIC SLEEVE RESECTION N/A 06/28/2019   Procedure: LAPAROSCOPIC GASTRIC SLEEVE RESECTION, Upper Endo, ERAS Pathway;  Surgeon: Clovis Riley, MD;  Location: [facility];  Service: General;  Laterality: N/A;   OB ultrasound N/A 12/01/2017   see report   POLYPECTOMY  09/03/2018   Procedure: POLYPECTOMY;  Surgeon: Jerene Bears, MD;  Location: [facility];  Service: Gastroenterology;;   TONSILLECTOMY  2006   WISDOM TOOTH EXTRACTION      Family History  Problem Relation Age of Onset   Liver disease Mother  67       advanced non-alcoholic cirrhosis   Arthritis Mother    Hyperlipidemia Mother    Heart disease Mother    Hypertension Mother    Diabetes Mother    Kidney disease Mother    Bleeding Disorder Mother    Colon polyps Mother    Asthma Father    Arthritis Father    Hyperlipidemia Father    Heart disease Father    Hypertension Father    Diabetes Father    Colon polyps Father    Breast cancer Maternal Aunt    Breast cancer Paternal Aunt    Esophageal cancer Paternal Aunt    Arthritis Maternal Grandmother    Breast cancer Maternal Grandmother    Thyroid disease Maternal Grandmother    Diabetes Maternal Grandfather  Pancreatic cancer Maternal Grandfather    Breast cancer Paternal Grandmother    Diabetes Paternal Grandmother    Colon cancer Paternal Grandfather    Diabetes Paternal Grandfather    Liver cancer Paternal Grandfather    Stomach cancer Paternal Grandfather     Social History:  reports that she has never smoked. She has never used smokeless tobacco. She reports that she does not drink alcohol and does not use drugs.  Allergies:  Allergies  Allergen Reactions   Progesterone Rash    Was in a form of birth control.   Penicillins Rash    Did it involve swelling of the face/tongue/throat, SOB, or low BP? No Did it involve sudden or severe rash/hives, skin peeling, or any reaction on the inside of your mouth or nose? Yes Did you need to seek medical attention at a hospital or doctor's office? No When did it last happen?      9 + months If all above answers are NO, may proceed with cephalosporin use.      Allergies as of 06/05/2021       Reactions   Progesterone Rash   Was in a form of birth control.   Penicillins Rash   Did it involve swelling of the face/tongue/throat, SOB, or low BP? No Did it involve sudden or severe rash/hives, skin peeling, or any reaction on the inside of your mouth or nose? Yes Did you need to seek medical attention at a hospital  or doctor's office? No When did it last happen?      9 + months If all above answers are NO, may proceed with cephalosporin use.        Medication List        Accurate as of June 05, 2021  4:13 PM. If you have any questions, ask your nurse or doctor.          AirDuo Digihaler 232-14 MCG/ACT Aepb Generic drug: Fluticasone-Salmeterol(sensor) Inhale 1 puff into the lungs in the morning and at bedtime.   albuterol (2.5 MG/3ML) 0.083% nebulizer solution Commonly known as: PROVENTIL Inhale 1 ampule by nebulization every 4 (four) hours as needed for wheezing or shortness of breath.   budesonide 0.25 MG/2ML nebulizer solution Commonly known as: Pulmicort Take 2 mLs (0.25 mg total) by nebulization 2 (two) times daily.   budesonide 32 MCG/ACT nasal spray Commonly known as: RHINOCORT AQUA Place 1 spray into both nostrils daily.   CALCIUM PO Take 1 tablet by mouth 2 (two) times daily.   cefdinir 300 MG capsule Commonly known as: OMNICEF Take 1 capsule (300 mg total) by mouth 2 (two) times daily for 14 days.   Cetirizine HCl 10 MG Caps Take 10 mg by mouth 2 (two) times daily.   chlorpheniramine-HYDROcodone 10-8 MG/5ML Suer Commonly known as: TUSSIONEX Take 2.5 - 72ms by mouth at bedtime as needed for cough.   chlorpheniramine-HYDROcodone 10-8 MG/5ML Suer Commonly known as: TUSSIONEX Take 2.5 - 5 mLs by mouth at bedtime as needed for cough.   dextromethorphan-guaiFENesin 30-600 MG 12hr tablet Commonly known as: MUCINEX DM Take 1 tablet by mouth 2 (two) times daily.   diphenhydrAMINE 25 MG tablet Commonly known as: BENADRYL Take 50 mg by mouth at bedtime as needed for allergies or sleep.   escitalopram 10 MG tablet Commonly known as: Lexapro Take 1 tablet by mouth once daily   famotidine 40 MG tablet Commonly known as: Pepcid Take 1 tablet (40 mg total) by mouth 2 (two) times daily.  gabapentin 100 MG capsule Commonly known as: NEURONTIN Take 1 capsule  (100 mg total) by mouth daily.   loratadine 10 MG tablet Commonly known as: Claritin Take 1 tablet by mouth 2 times daily as needed for allergies.   multivitamin-prenatal 27-0.8 MG Tabs tablet Take 1 tablet by mouth 2 (two) times daily.   nadolol 20 MG tablet Commonly known as: CORGARD Take 1 tablet (20 mg total) by mouth 2 (two) times daily.   Olopatadine HCl 0.2 % Soln Can use one drop in each eye once daily if needed. What changed:  how much to take how to take this when to take this reasons to take this additional instructions   ondansetron 8 MG disintegrating tablet Commonly known as: ZOFRAN-ODT DISSOLVE 1 TABLET BY MOUTH EVERY 8 HOURS AS NEEDED FOR NAUSEA OR VOMITING What changed:  how much to take how to take this when to take this reasons to take this   pantoprazole 40 MG tablet Commonly known as: PROTONIX Take 1 tablet (40 mg total) by mouth 2 (two) times daily before a meal.   ProAir Digihaler 108 (90 Base) MCG/ACT Aepb Generic drug: Albuterol Sulfate (sensor) Inhale 2 puffs into the lungs every 4 hours as needed (For coughing and wheezing).   promethazine 25 MG tablet Commonly known as: PHENERGAN Take 1 tablet by mouth every 6 hours as needed for vomiting or nausea.   scopolamine 1 MG/3DAYS Commonly known as: TRANSDERM-SCOP Place 1 patch (1.5 mg total) onto the skin every 3 (three) days.   Sodium Fluoride 1.1 % Pste Brush at bedtime (Do not rinse)   sucralfate 1 GM/10ML suspension Commonly known as: CARAFATE Take 10 mLs by mouth 4 times daily -  with meals and at bedtime.   temazepam 15 MG capsule Commonly known as: RESTORIL Take 1 capsule (15 mg total) by mouth at bedtime as needed for sleep.   temazepam 15 MG capsule Commonly known as: RESTORIL Take 1 capsule by mouth at bedtime as needed for sleep   ursodiol 300 MG capsule Commonly known as: ACTIGALL Take 1 capsule (300 mg total) by mouth 2 (two) times daily   verapamil 180 MG CR  tablet Commonly known as: CALAN-SR Take 1 tablet (180 mg total) by mouth at bedtime.   vitamin C 1000 MG tablet Take 2,000 mg by mouth 2 (two) times daily.   VITAMIN D PO Take 1 capsule by mouth 2 (two) times daily.   VITAMIN E PO Take 1 capsule by mouth daily.           Review of Systems  Constitutional:  Positive for weight loss.       She had gastric sleeve procedure in 1/21, subsequently lost 100 lbs but has gained back most of the weight recently.  She thinks she has not been active and has not followed any diet.  HENT:  Negative for headaches.   Respiratory:  Positive for daytime sleepiness and shortness of breath.   Cardiovascular:  Positive for palpitations.       She has palpitations and rapid heartbeat for the last 4 to 5 months, this is despite taking nadolol 20 mg daily which was subsequently increased to twice a day with control of symptoms.  Also on verapamil and followed by cardiologist  Endocrine: Positive for fatigue and light-headedness.       In the last month or so she feels lightheaded sometimes when standing up or bending down.  Musculoskeletal:  Negative for joint pain.  Neurological:  Negative for numbness.  Psychiatric/Behavioral:  Positive for insomnia.   She has cirrhosis with complications from [facility] and is waiting for liver transplant at [facility]   Blood pressure history: She thinks her blood pressure is usually low chronically even before she was on nadolol Blood pressure has been lower in the last month with increasing her nadolol and continuing verapamil  BP Readings from Last 3 Encounters:  06/05/21 92/70  06/04/21 138/70  05/30/21 140/72           Examination:    BP 92/70 (Patient Position: Standing, Cuff Size: Large)    Pulse 73    Ht 5' 6"  (1.676 m)    Wt (!) 301 lb 9.6 oz (136.8 kg)    SpO2 99%    BMI 48.68 kg/m   GENERAL exam:  No cushingoid features Externally eye exam is normal Neck exam shows no acanthosis THYROID is not  palpable  Lungs: clear to auscultation bilaterally Heart: regular rate and rhythm, S1, S2 normal, no murmur, click, rub or gallop Skin: Skin color, texture, turgor normal. No rashes or lesions Neurologic: Reflexes: 2+ and symmetric   No peripheral edema   Assessment:  HYPOTHYROIDISM, history of.  She was referred here because of palpitations and tachycardia However thyroid functions are normal Historically she tends to have low normal free T4 levels However she has multiple other medical problems that account for her fatigue and previously a trial of thyroid supplementation was not tolerated also No goiter on exam today  TACHYCARDIA: This is likely to be from autonomic dysfunction of unclear etiology She will need to continue working with her cardiologist regarding this  Blood pressure is relatively low today but there is no prior history of orthostatic hypotension   PLAN:  Currently no further evaluation or treatment needed from endocrine standpoint  She will discuss management of her medications for tachycardia since her blood pressure is relatively low and she appears symptomatic  Elayne Snare 06/05/2021, 4:13 PM    Note: This office note was prepared with Dragon voice recognition system technology. Any transcriptional errors that result from this process are unintentional.

## 2021-06-05 NOTE — Telephone Encounter (Signed)
Agree with the plan.

## 2021-06-07 ENCOUNTER — Other Ambulatory Visit (HOSPITAL_COMMUNITY): Payer: Self-pay

## 2021-06-07 MED ORDER — HYDROCODONE BIT-HOMATROP MBR 5-1.5 MG/5ML PO SOLN
ORAL | 0 refills | Status: DC
  Filled 2021-06-07: qty 140, 7d supply, fill #0

## 2021-06-07 MED ORDER — AZITHROMYCIN 250 MG PO TABS
ORAL_TABLET | ORAL | 0 refills | Status: DC
  Filled 2021-06-07: qty 6, 5d supply, fill #0

## 2021-06-07 MED ORDER — BUDESONIDE-FORMOTEROL FUMARATE 160-4.5 MCG/ACT IN AERO
INHALATION_SPRAY | RESPIRATORY_TRACT | 1 refills | Status: DC
Start: 2021-06-07 — End: 2021-08-09
  Filled 2021-06-07 – 2021-07-17 (×3): qty 10.2, 30d supply, fill #0

## 2021-06-07 MED ORDER — ALBUTEROL SULFATE HFA 108 (90 BASE) MCG/ACT IN AERS
INHALATION_SPRAY | RESPIRATORY_TRACT | 1 refills | Status: DC
  Filled 2021-06-07: qty 18, 30d supply, fill #0
  Filled 2021-07-04: qty 8.5, 33d supply, fill #1
  Filled 2021-07-17: qty 18, 30d supply, fill #0
  Filled 2021-07-18: qty 8.5, 33d supply, fill #0

## 2021-06-10 ENCOUNTER — Encounter: Payer: Self-pay | Admitting: Internal Medicine

## 2021-06-11 ENCOUNTER — Other Ambulatory Visit: Payer: Self-pay

## 2021-06-11 ENCOUNTER — Ambulatory Visit (INDEPENDENT_AMBULATORY_CARE_PROVIDER_SITE_OTHER): Payer: 59 | Admitting: Allergy and Immunology

## 2021-06-11 ENCOUNTER — Other Ambulatory Visit (HOSPITAL_COMMUNITY): Payer: Self-pay

## 2021-06-11 ENCOUNTER — Encounter: Payer: Self-pay | Admitting: Allergy and Immunology

## 2021-06-11 VITALS — BP 100/60 | HR 105 | Temp 97.6°F | Resp 16 | Ht 65.35 in | Wt 299.2 lb

## 2021-06-11 DIAGNOSIS — J1100 Influenza due to unidentified influenza virus with unspecified type of pneumonia: Secondary | ICD-10-CM | POA: Diagnosis not present

## 2021-06-11 DIAGNOSIS — K219 Gastro-esophageal reflux disease without esophagitis: Secondary | ICD-10-CM | POA: Diagnosis not present

## 2021-06-11 DIAGNOSIS — J3089 Other allergic rhinitis: Secondary | ICD-10-CM

## 2021-06-11 DIAGNOSIS — J4551 Severe persistent asthma with (acute) exacerbation: Secondary | ICD-10-CM | POA: Diagnosis not present

## 2021-06-11 DIAGNOSIS — R042 Hemoptysis: Secondary | ICD-10-CM

## 2021-06-11 NOTE — Progress Notes (Signed)
[place] [facility] [place] [facility] [place]   Follow-up Note  Referring Provider: Deon Pilling, NP Primary Provider: Deon Pilling, NP Date of Office Visit: 06/11/2021  Subjective:   Linda Becker (DOB: October 07, 1990) is a 30 y.o. female who returns to the [facility] [place] on 06/11/2021 in re-evaluation of the following:  HPI: Linda Becker returns to this clinic in evaluation of asthma, allergic rhinitis, LPR.  I last saw her in this clinic on 04 June 2021.  She has had a difficult month.  She had some form of respiratory tract infection that gave rise to persistent cough and unfortunately last week she became a little bit worse and noticed some bloody sputum and she went to [facility] and was diagnosed with influenza based upon a flu swab and had an chest x-ray which identified "bilateral pneumonia" and she was given a Z-Pak.  She is about the same with lots of coughing.  Some of her sputum may be streaked with blood intermittently.  She does not have any chest pain other than the fact that her ribs hurt from coughing.  Allergies as of 06/11/2021       Reactions   Progesterone Rash   Was in a form of birth control.   Penicillins Rash   Did it involve swelling of the face/tongue/throat, SOB, or low BP? No Did it involve sudden or severe rash/hives, skin peeling, or any reaction on the inside of your mouth or nose? Yes Did you need to seek medical attention at a hospital or doctor's office? No When did it last happen?      9 + months If all above answers are NO, may proceed with cephalosporin use.        Medication List    AirDuo Digihaler 232-14 MCG/ACT Aepb Generic drug: Fluticasone-Salmeterol(sensor) Inhale 1 puff into the lungs in the morning and at bedtime.   albuterol (2.5 MG/3ML) 0.083% nebulizer solution Commonly known as: PROVENTIL Inhale 1 ampule by nebulization every 4 (four) hours as needed for wheezing or shortness of  breath.   albuterol 108 (90 Base) MCG/ACT inhaler Commonly known as: Ventolin HFA Inhale 1 puff by mouth every 4 hours as needed   budesonide 0.25 MG/2ML nebulizer solution Commonly known as: Pulmicort Take 2 mLs (0.25 mg total) by nebulization 2 (two) times daily.   budesonide 32 MCG/ACT nasal spray Commonly known as: RHINOCORT AQUA Place 1 spray into both nostrils daily.   CALCIUM PO Take 1 tablet by mouth 2 (two) times daily.   Cetirizine HCl 10 MG Caps Take 10 mg by mouth 2 (two) times daily.   chlorpheniramine-HYDROcodone 10-8 MG/5ML Suer Commonly known as: TUSSIONEX Take 2.5 - 21ms by mouth at bedtime as needed for cough.   chlorpheniramine-HYDROcodone 10-8 MG/5ML Suer Commonly known as: TUSSIONEX Take 2.5 - 5 mLs by mouth at bedtime as needed for cough.   dextromethorphan-guaiFENesin 30-600 MG 12hr tablet Commonly known as: MUCINEX DM Take 1 tablet by mouth 2 (two) times daily.   diphenhydrAMINE 25 MG tablet Commonly known as: BENADRYL Take 50 mg by mouth at bedtime as needed for allergies or sleep.   escitalopram 10 MG tablet Commonly known as: Lexapro Take 1 tablet by mouth once daily   famotidine 40 MG tablet Commonly known as: Pepcid Take 1 tablet (40 mg total) by mouth 2 (two) times daily.   gabapentin 100 MG capsule Commonly known as: NEURONTIN Take 1 capsule (100 mg total) by mouth daily.  HYDROcodone bit-homatropine 5-1.5 MG/5ML syrup Commonly known as: Hydromet Take 5 mL by mouth every 6 hours as needed for 7 days   loratadine 10 MG tablet Commonly known as: Claritin Take 1 tablet by mouth 2 times daily as needed for allergies.   multivitamin-prenatal 27-0.8 MG Tabs tablet Take 1 tablet by mouth 2 (two) times daily.   nadolol 20 MG tablet Commonly known as: CORGARD Take 1 tablet (20 mg total) by mouth 2 (two) times daily.   Olopatadine HCl 0.2 % Soln Can use one drop in each eye once daily if needed. What changed:  how much to  take how to take this when to take this reasons to take this additional instructions   ondansetron 8 MG disintegrating tablet Commonly known as: ZOFRAN-ODT DISSOLVE 1 TABLET BY MOUTH EVERY 8 HOURS AS NEEDED FOR NAUSEA OR VOMITING   pantoprazole 40 MG tablet Commonly known as: PROTONIX Take 1 tablet (40 mg total) by mouth 2 (two) times daily before a meal.   ProAir Digihaler 108 (90 Base) MCG/ACT Aepb Generic drug: Albuterol Sulfate (sensor) Inhale 2 puffs into the lungs every 4 hours as needed (For coughing and wheezing).   promethazine 25 MG tablet Commonly known as: PHENERGAN Take 1 tablet by mouth every 6 hours as needed for vomiting or nausea.   scopolamine 1 MG/3DAYS Commonly known as: TRANSDERM-SCOP Place 1 patch (1.5 mg total) onto the skin every 3 (three) days.   Sodium Fluoride 1.1 % Pste Brush at bedtime (Do not rinse)   sucralfate 1 GM/10ML suspension Commonly known as: CARAFATE Take 10 mLs by mouth 4 times daily -  with meals and at bedtime.   Symbicort 160-4.5 MCG/ACT inhaler Generic drug: budesonide-formoterol Inale 2 puffs by mouth twice a day   temazepam 15 MG capsule Commonly known as: RESTORIL Take 1 capsule (15 mg total) by mouth at bedtime as needed for sleep.   ursodiol 300 MG capsule Commonly known as: ACTIGALL Take 1 capsule (300 mg total) by mouth 2 (two) times daily   verapamil 180 MG CR tablet Commonly known as: CALAN-SR Take 1 tablet (180 mg total) by mouth at bedtime.   vitamin C 1000 MG tablet Take 2,000 mg by mouth 2 (two) times daily.   VITAMIN D PO Take 1 capsule by mouth 2 (two) times daily.   VITAMIN E PO Take 1 capsule by mouth daily.    Past Medical History:  Diagnosis Date   Allergic rhinitis    remote hx of allergy shots   Asthma    since childhood  on controller meds extrinsic dr Carmelina Peal   Bipolar II disorder ([place])    Cirrhosis ([place])    Esophageal varices ([place])    Family history of adverse reaction to anesthesia     mom and sister difficult to awaken per patient    Gallbladder sludge    Gastropathy    GERD (gastroesophageal reflux disease)    on nexium  for  long term sx since childhood   H/O miscarriage, not currently pregnant    [redacted] weeks  march 2016   Hepatic steatosis    Hiatal hernia    Hypothyroidism    Migraine    Murmur    pt reports MVP   Portal hypertension ([place])    Sessile colonic polyp    Splenomegaly    Steatohepatitis    Syncope    under eval ? cause   Tachycardia    episodes with near syncope eval dr Nadyne Coombes  on no meds dced LABA   Thrombocytopenia ([facility])    Upper GI bleed     Past Surgical History:  Procedure Laterality Date   BIOPSY  09/03/2018   Procedure: BIOPSY;  Surgeon: Jerene Bears, MD;  Location: [facility];  Service: Gastroenterology;;   BIOPSY  09/26/2020   Procedure: BIOPSY;  Surgeon: Jerene Bears, MD;  Location: [facility];  Service: Gastroenterology;;   broken right femur  2008   rod placed   COLONOSCOPY WITH PROPOFOL N/A 09/03/2018   Procedure: COLONOSCOPY WITH PROPOFOL;  Surgeon: Jerene Bears, MD;  Location: [facility];  Service: Gastroenterology;  Laterality: N/A;   DILATION AND CURETTAGE OF UTERUS     ESOPHAGOGASTRODUODENOSCOPY (EGD) WITH PROPOFOL N/A 09/03/2018   Procedure: ESOPHAGOGASTRODUODENOSCOPY (EGD) WITH PROPOFOL;  Surgeon: Jerene Bears, MD;  Location: [facility];  Service: Gastroenterology;  Laterality: N/A;   ESOPHAGOGASTRODUODENOSCOPY (EGD) WITH PROPOFOL N/A 01/27/2020   Procedure: ESOPHAGOGASTRODUODENOSCOPY (EGD) WITH PROPOFOL;  Surgeon: Rush Landmark Telford Nab., MD;  Location: [facility];  Service: Gastroenterology;  Laterality: N/A;   ESOPHAGOGASTRODUODENOSCOPY (EGD) WITH PROPOFOL N/A 03/12/2020   Procedure: ESOPHAGOGASTRODUODENOSCOPY (EGD) WITH PROPOFOL;  Surgeon: Jerene Bears, MD;  [facility]: [facility];  Service: Gastroenterology;  Laterality: N/A;   ESOPHAGOGASTRODUODENOSCOPY (EGD) WITH PROPOFOL N/A 09/26/2020   Procedure:  ESOPHAGOGASTRODUODENOSCOPY (EGD) WITH PROPOFOL;  Surgeon: Jerene Bears, MD;  [facility];  Service: Gastroenterology;  Laterality: N/A;   ESOPHAGOGASTRODUODENOSCOPY (EGD) WITH PROPOFOL N/A 04/29/2021   Procedure: ESOPHAGOGASTRODUODENOSCOPY (EGD) WITH PROPOFOL;  Surgeon: Jerene Bears, MD;  [facility]: [facility];  Service: Gastroenterology;  Laterality: N/A;   GASTRIC VARICES BANDING  01/27/2020   Procedure: GASTRIC VARICES BANDING;  Surgeon: Rush Landmark Telford Nab., MD;  Location: [facility];  Service: Gastroenterology;;   HOT HEMOSTASIS N/A 04/29/2021   Procedure: HOT HEMOSTASIS (ARGON PLASMA COAGULATION/BICAP);  Surgeon: Jerene Bears, MD;  Location: [facility];  Service: Gastroenterology;  Laterality: N/A;   IR TRANSCATHETER BX  01/19/2019   IR US GUIDE VASC ACCESS RIGHT  01/19/2019   IR VENOGRAM HEPATIC W HEMODYNAMIC EVALUATION  01/19/2019   LAPAROSCOPIC GASTRIC SLEEVE RESECTION N/A 06/28/2019   Procedure: LAPAROSCOPIC GASTRIC SLEEVE RESECTION, Upper Endo, ERAS Pathway;  Surgeon: Clovis Riley, MD;  Location: [facility];  Service: General;  Laterality: N/A;   OB ultrasound N/A 12/01/2017   see report   POLYPECTOMY  09/03/2018   Procedure: POLYPECTOMY;  Surgeon: Jerene Bears, MD;  Location: [facility];  Service: Gastroenterology;;   TONSILLECTOMY  2006   WISDOM TOOTH EXTRACTION      Review of systems negative except as noted in HPI / PMHx or noted below:  Review of Systems  Constitutional: Negative.   HENT: Negative.    Eyes: Negative.   Respiratory: Negative.    Cardiovascular: Negative.   Gastrointestinal: Negative.   Genitourinary: Negative.   Musculoskeletal: Negative.   Skin: Negative.   Neurological: Negative.   Endo/Heme/Allergies: Negative.   Psychiatric/Behavioral: Negative.      Objective:   Vitals:   06/11/21 1515  BP: 100/60  Pulse: (!) 105  Resp: 16  Temp: 97.6 F (36.4 C)  SpO2: 100%   Height: 5' 5.35" (166 cm)  Weight: 299 lb 3.2 oz (135.7  kg)   Physical Exam Constitutional:      Appearance: She is not diaphoretic.  HENT:     Head: Normocephalic.     Right Ear: Tympanic membrane, ear canal and external ear normal.     Left Ear: Tympanic membrane,  ear canal and external ear normal.     Nose: Nose normal. No mucosal edema or rhinorrhea.     Mouth/Throat:     Pharynx: Uvula midline. No oropharyngeal exudate.  Eyes:     Conjunctiva/sclera: Conjunctivae normal.  Neck:     Thyroid: No thyromegaly.     Trachea: Trachea normal. No tracheal tenderness or tracheal deviation.  Cardiovascular:     Rate and Rhythm: Normal rate and regular rhythm.     Heart sounds: Normal heart sounds, S1 normal and S2 normal. No murmur heard. Pulmonary:     Effort: No respiratory distress.     Breath sounds: Normal breath sounds. No stridor. No wheezing or rales.  Lymphadenopathy:     Head:     Right side of head: No tonsillar adenopathy.     Left side of head: No tonsillar adenopathy.     Cervical: No cervical adenopathy.  Skin:    Findings: No erythema or rash.     Nails: There is no clubbing.  Neurological:     Mental Status: She is alert.    Diagnostics:   The patient had an Asthma Control Test with the following results: ACT Total Score: 5.    Assessment and Plan:   1. Severe persistent asthma with exacerbation   2. Perennial allergic rhinitis   3. Gastroesophageal reflux disease, unspecified whether esophagitis present   4. Influenza and pneumonia   5. Hemoptysis     1.  Treat and prevent inflammation:  A. Breztri - 2 inhalations 2 times per day B. Budesonide 0.5 mg - nebulize 4 times per day C. OTC Budesonide - 1 spray each nostril 2 time per day D. Dupilumab injection every 2 weeks  2.  Treat and prevent reflux/LPR:   A. Consolidate all caffeine consumption   B. Famotidine 40 mg - 1 tablet twice a day  3.  If needed:  A. Albuterol - 2 inhalations or nebulization every 4-6 hours  B. Antihistamine - loratadine /  cetirizine C. Mucinex DM - 2 tablets 2 times per day  4. Return to clinic in 4 weeks or earlier if problem   5. To ER if bloody sputum increases  Linda Becker unfortunately has contracted influenza on an already inflamed airway from a previous viral induced flare of her respiratory tract condition.  I am going to treat her with anti-inflammatory agents for her airway as noted above and hopefully within the next 1 to 2 weeks she will start to resolve this scenario that she has been stuck in over the course of the past month.  I have informed her that she needs to go to the emergency room if she has more bloody sputum in the future.  I will see her back in this clinic in 4 weeks or earlier if there is a problem.  Allena Katz, MD Allergy / Immunology [place]

## 2021-06-11 NOTE — Patient Instructions (Signed)
°  1.  Treat and prevent inflammation:  A. Breztri - 2 inhalations 2 times per day B. Budesonide 0.5 mg - nebulize 4 times per day C. OTC Budesonide - 1 spray each nostril 2 time per day D. Dupilumab injection every 2 weeks  2.  Treat and prevent reflux/LPR:   A. Consolidate all caffeine consumption   B. Famotidine 40 mg - 1 tablet twice a day  3.  If needed:  A. Albuterol - 2 inhalations or nebulization every 4-6 hours  B. Antihistamine - loratadine / cetirizine C. Mucinex DM - 2 tablets 2 times per day  4. Return to clinic in 4 weeks or earlier if problem   5. To ER if bloody sputum increases

## 2021-06-12 ENCOUNTER — Other Ambulatory Visit (HOSPITAL_COMMUNITY): Payer: Self-pay

## 2021-06-12 ENCOUNTER — Encounter: Payer: Self-pay | Admitting: Allergy and Immunology

## 2021-06-12 ENCOUNTER — Encounter: Payer: Self-pay | Admitting: Internal Medicine

## 2021-06-12 MED ORDER — LORATADINE 10 MG PO TABS
10.0000 mg | ORAL_TABLET | Freq: Two times a day (BID) | ORAL | 5 refills | Status: DC | PRN
Start: 2021-06-12 — End: 2021-08-09
  Filled 2021-06-12: qty 60, 30d supply, fill #0

## 2021-06-12 MED ORDER — BUDESONIDE 32 MCG/ACT NA SUSP
1.0000 | Freq: Every day | NASAL | 5 refills | Status: DC
  Filled 2021-06-12: qty 8.43, 60d supply, fill #0

## 2021-06-12 MED ORDER — BUDESONIDE 0.25 MG/2ML IN SUSP
0.2500 mg | Freq: Two times a day (BID) | RESPIRATORY_TRACT | 1 refills | Status: DC
  Filled 2021-06-12 – 2021-06-24 (×2): qty 60, 15d supply, fill #0

## 2021-06-12 MED ORDER — OLOPATADINE HCL 0.2 % OP SOLN
1.0000 [drp] | Freq: Every day | OPHTHALMIC | 1 refills | Status: DC | PRN
  Filled 2021-06-12: qty 7.5, 150d supply, fill #0

## 2021-06-12 MED ORDER — ALBUTEROL SULFATE (2.5 MG/3ML) 0.083% IN NEBU
2.5000 mg | INHALATION_SOLUTION | RESPIRATORY_TRACT | 1 refills | Status: DC | PRN
  Filled 2021-06-12: qty 90, 5d supply, fill #0

## 2021-06-12 NOTE — Telephone Encounter (Signed)
L/m for patient again to reach out to me

## 2021-06-12 NOTE — Addendum Note (Signed)
Addended by: Clovis Cao A on: 06/12/2021 11:14 AM   Modules accepted: Orders

## 2021-06-13 ENCOUNTER — Encounter: Payer: Self-pay | Admitting: *Deleted

## 2021-06-13 ENCOUNTER — Encounter: Payer: Self-pay | Admitting: Internal Medicine

## 2021-06-13 ENCOUNTER — Other Ambulatory Visit (HOSPITAL_COMMUNITY): Payer: Self-pay

## 2021-06-13 NOTE — Telephone Encounter (Signed)
Sent Mychart message to contact me regarding her new Ins Jan 1 for Dupixent approval

## 2021-06-14 ENCOUNTER — Other Ambulatory Visit (HOSPITAL_COMMUNITY): Payer: Self-pay

## 2021-06-14 ENCOUNTER — Other Ambulatory Visit: Payer: Self-pay | Admitting: Internal Medicine

## 2021-06-17 ENCOUNTER — Other Ambulatory Visit: Payer: Self-pay | Admitting: Internal Medicine

## 2021-06-17 ENCOUNTER — Other Ambulatory Visit (HOSPITAL_COMMUNITY): Payer: Self-pay

## 2021-06-18 ENCOUNTER — Encounter: Payer: Self-pay | Admitting: Internal Medicine

## 2021-06-18 ENCOUNTER — Other Ambulatory Visit (HOSPITAL_COMMUNITY): Payer: Self-pay

## 2021-06-18 NOTE — Telephone Encounter (Signed)
Left a message for patient that we sent a prescription for temazepam on 05/23/21 for #30 with 2 additional refills so she should contact pharmacy to get rx filled. In addition, Dr [name] has asked for an update on how the temazepam is working for her. Is it effective and how often is she needing to take the medication? How many tablets is she taking? I advised patient to call back with response or send a mychart update.

## 2021-06-18 NOTE — Telephone Encounter (Signed)
We need to document if the medication was effective, is she needing it every night, and has it been the needed number of days to provide refill.  Patient should be no more than 1 per night

## 2021-06-19 ENCOUNTER — Telehealth: Payer: Self-pay | Admitting: Internal Medicine

## 2021-06-19 ENCOUNTER — Encounter (HOSPITAL_COMMUNITY): Payer: Self-pay | Admitting: Emergency Medicine

## 2021-06-19 ENCOUNTER — Other Ambulatory Visit (HOSPITAL_COMMUNITY): Payer: Self-pay

## 2021-06-19 ENCOUNTER — Emergency Department (HOSPITAL_COMMUNITY)
Admission: EM | Admit: 2021-06-19 | Discharge: 2021-06-19 | Disposition: A | Discharge status: Home / Self Care | Payer: Medicaid Other | Attending: Emergency Medicine | Admitting: Emergency Medicine

## 2021-06-19 DIAGNOSIS — J189 Pneumonia, unspecified organism: Secondary | ICD-10-CM | POA: Insufficient documentation

## 2021-06-19 DIAGNOSIS — K920 Hematemesis: Secondary | ICD-10-CM | POA: Insufficient documentation

## 2021-06-19 DIAGNOSIS — Z5321 Procedure and treatment not carried out due to patient leaving prior to being seen by health care provider: Secondary | ICD-10-CM | POA: Insufficient documentation

## 2021-06-19 NOTE — Telephone Encounter (Signed)
Patient came to the office to fill out release and pt intake forms for her disability application. Forms were given to CMA Dottie.

## 2021-06-19 NOTE — ED Triage Notes (Signed)
Patient here after being diagnosed with pneumonia and vomiting blood. PAtient was prescribed an antibiotic a few days ago for the pneumonia but then came in today to be evaluated due to 1x episode of hematemesis. Patient alert, oriented, ambulatory, and in no apparent distress at this time.

## 2021-06-19 NOTE — Telephone Encounter (Signed)
Inbound call from patient states she is vomiting blood. Advise patient should go to ED. She would like a call back

## 2021-06-19 NOTE — ED Triage Notes (Signed)
Ddint answer for MSE exam.

## 2021-06-19 NOTE — Telephone Encounter (Signed)
Returned patient's call. Pt stated that she had recently been sick and had the flu and pneumonia and was previously coughing up blood but now she states the blood is in her vomit. Advised pt to go to go to the ED. Pt verbalized understanding.

## 2021-06-19 NOTE — ED Notes (Signed)
Pt did not respond when called for vitals check or MSE signature

## 2021-06-20 ENCOUNTER — Other Ambulatory Visit (HOSPITAL_COMMUNITY): Payer: Self-pay

## 2021-06-20 ENCOUNTER — Other Ambulatory Visit: Payer: Self-pay

## 2021-06-20 ENCOUNTER — Other Ambulatory Visit (INDEPENDENT_AMBULATORY_CARE_PROVIDER_SITE_OTHER): Payer: Self-pay

## 2021-06-20 ENCOUNTER — Other Ambulatory Visit: Payer: Self-pay | Admitting: Internal Medicine

## 2021-06-20 DIAGNOSIS — K920 Hematemesis: Secondary | ICD-10-CM

## 2021-06-20 LAB — CBC WITH DIFFERENTIAL/PLATELET
Basophils Absolute: 0 10*3/uL (ref 0.0–0.1)
Basophils Relative: 0.5 % (ref 0.0–3.0)
Eosinophils Absolute: 0.2 10*3/uL (ref 0.0–0.7)
Eosinophils Relative: 2.7 % (ref 0.0–5.0)
HCT: 35.5 % — ABNORMAL LOW (ref 36.0–46.0)
Hemoglobin: 11.4 g/dL — ABNORMAL LOW (ref 12.0–15.0)
Lymphocytes Relative: 22.5 % (ref 12.0–46.0)
Lymphs Abs: 1.6 10*3/uL (ref 0.7–4.0)
MCHC: 32.2 g/dL (ref 30.0–36.0)
MCV: 92.8 fl (ref 78.0–100.0)
Monocytes Absolute: 0.4 10*3/uL (ref 0.1–1.0)
Monocytes Relative: 5.2 % (ref 3.0–12.0)
Neutro Abs: 4.8 10*3/uL (ref 1.4–7.7)
Neutrophils Relative %: 69.1 % (ref 43.0–77.0)
Platelets: 122 10*3/uL — ABNORMAL LOW (ref 150.0–400.0)
RBC: 3.83 Mil/uL — ABNORMAL LOW (ref 3.87–5.11)
RDW: 21.6 % — ABNORMAL HIGH (ref 11.5–15.5)
WBC: 6.9 10*3/uL (ref 4.0–10.5)

## 2021-06-20 LAB — COMPREHENSIVE METABOLIC PANEL
ALT: 16 U/L (ref 0–35)
AST: 13 U/L (ref 0–37)
Albumin: 3.5 g/dL (ref 3.5–5.2)
Alkaline Phosphatase: 74 U/L (ref 39–117)
BUN: 14 mg/dL (ref 6–23)
CO2: 25 mEq/L (ref 19–32)
Calcium: 8.8 mg/dL (ref 8.4–10.5)
Chloride: 109 mEq/L (ref 96–112)
Creatinine, Ser: 0.84 mg/dL (ref 0.40–1.20)
GFR: 94.44 mL/min (ref >60.00)
Glucose, Bld: 151 mg/dL — ABNORMAL HIGH (ref 70–99)
Potassium: 4.2 mEq/L (ref 3.5–5.1)
Sodium: 139 mEq/L (ref 135–145)
Total Bilirubin: 0.4 mg/dL (ref 0.2–1.2)
Total Protein: 6.3 g/dL (ref 6.0–8.3)

## 2021-06-20 LAB — PROTIME-INR
INR: 1.1 ratio — ABNORMAL HIGH (ref 0.8–1.0)
Prothrombin Time: 12.2 s (ref 9.6–13.1)

## 2021-06-20 NOTE — Telephone Encounter (Signed)
Please let patient know that with concern of upper GI bleeding in a patient with cirrhosis this can cannot be handled in the outpatient setting She should be seen in the emergency department If she has had no further hematemesis and no melenic stool and she can come for CBC, CMP and INR today However, the safest would be to be evaluated in the emergency setting

## 2021-06-21 ENCOUNTER — Other Ambulatory Visit (HOSPITAL_COMMUNITY): Payer: Self-pay

## 2021-06-21 ENCOUNTER — Other Ambulatory Visit: Payer: Self-pay | Admitting: Internal Medicine

## 2021-06-22 ENCOUNTER — Other Ambulatory Visit: Payer: Self-pay | Admitting: Internal Medicine

## 2021-06-22 ENCOUNTER — Other Ambulatory Visit (HOSPITAL_COMMUNITY): Payer: Self-pay

## 2021-06-24 ENCOUNTER — Other Ambulatory Visit (HOSPITAL_COMMUNITY): Payer: Self-pay

## 2021-06-24 MED ORDER — PROMETHAZINE HCL 25 MG PO TABS
25.0000 mg | ORAL_TABLET | Freq: Four times a day (QID) | ORAL | 0 refills | Status: DC | PRN
  Filled 2021-06-24 (×4): qty 120, 30d supply, fill #0

## 2021-06-24 MED FILL — Ondansetron Orally Disintegrating Tab 8 MG: ORAL | 20 days supply | Qty: 60 | Fill #3 | Status: CN

## 2021-06-25 ENCOUNTER — Encounter: Payer: Self-pay | Admitting: Internal Medicine

## 2021-06-25 ENCOUNTER — Other Ambulatory Visit (HOSPITAL_COMMUNITY): Payer: Self-pay

## 2021-06-25 MED ORDER — ESCITALOPRAM OXALATE 20 MG PO TABS
20.0000 mg | ORAL_TABLET | Freq: Every day | ORAL | 1 refills | Status: DC
  Filled 2021-06-25 – 2021-07-19 (×3): qty 90, 90d supply, fill #0
  Filled 2022-01-17 – 2022-05-15 (×3): qty 90, 90d supply, fill #1

## 2021-06-25 NOTE — Telephone Encounter (Signed)
No response to message or calls

## 2021-06-26 ENCOUNTER — Other Ambulatory Visit (HOSPITAL_COMMUNITY): Payer: Self-pay

## 2021-06-26 MED ORDER — ARIPIPRAZOLE 5 MG PO TABS
5.0000 mg | ORAL_TABLET | Freq: Every day | ORAL | 1 refills | Status: DC
  Filled 2021-06-26 – 2021-07-20 (×3): qty 30, 30d supply, fill #0
  Filled 2021-08-26 – 2021-09-30 (×4): qty 30, 30d supply, fill #1

## 2021-07-03 ENCOUNTER — Other Ambulatory Visit (HOSPITAL_COMMUNITY): Payer: Self-pay

## 2021-07-04 ENCOUNTER — Other Ambulatory Visit (HOSPITAL_COMMUNITY): Payer: Self-pay

## 2021-07-04 DIAGNOSIS — Z0279 Encounter for issue of other medical certificate: Secondary | ICD-10-CM

## 2021-07-04 MED ORDER — HYDROCODONE-ACETAMINOPHEN 5-325 MG PO TABS
1.0000 | ORAL_TABLET | Freq: Three times a day (TID) | ORAL | 0 refills | Status: DC | PRN
Start: 2021-07-04 — End: 2022-01-23
  Filled 2021-07-04: qty 21, 7d supply, fill #0
  Filled 2021-07-05 (×2): qty 69, 23d supply, fill #1

## 2021-07-04 MED ORDER — FLUCONAZOLE 150 MG PO TABS
150.0000 mg | ORAL_TABLET | Freq: Once | ORAL | 0 refills | Status: AC
  Filled 2021-07-04: qty 1, 1d supply, fill #0

## 2021-07-05 ENCOUNTER — Encounter: Payer: Self-pay | Admitting: Allergy and Immunology

## 2021-07-05 ENCOUNTER — Other Ambulatory Visit (HOSPITAL_COMMUNITY): Payer: Self-pay

## 2021-07-05 ENCOUNTER — Other Ambulatory Visit: Payer: Self-pay | Admitting: *Deleted

## 2021-07-05 ENCOUNTER — Telehealth: Payer: Self-pay | Admitting: *Deleted

## 2021-07-05 ENCOUNTER — Encounter: Payer: Self-pay | Admitting: Endocrinology

## 2021-07-05 MED ORDER — BREZTRI AEROSPHERE 160-9-4.8 MCG/ACT IN AERO
2.0000 | INHALATION_SPRAY | Freq: Two times a day (BID) | RESPIRATORY_TRACT | 5 refills | Status: DC
  Filled 2021-07-05 – 2021-07-08 (×2): qty 10.7, 30d supply, fill #0

## 2021-07-05 NOTE — Telephone Encounter (Signed)
PA has been submitted through [facility] [employer] for [place] and is currently pending approval/denial.

## 2021-07-08 ENCOUNTER — Other Ambulatory Visit (HOSPITAL_COMMUNITY): Payer: Self-pay

## 2021-07-08 NOTE — Telephone Encounter (Signed)
PA has been approved for [employer]. PA has been faxed to patients pharmacy, labeled, and placed in bulk scanning.

## 2021-07-09 ENCOUNTER — Other Ambulatory Visit: Payer: Self-pay | Admitting: Physician Assistant

## 2021-07-09 ENCOUNTER — Ambulatory Visit: Payer: 59 | Admitting: Allergy and Immunology

## 2021-07-09 ENCOUNTER — Ambulatory Visit
Admission: RE | Admit: 2021-07-09 | Discharge: 2021-07-09 | Disposition: A | Discharge status: Home / Self Care | Payer: Managed Care, Other (non HMO) | Source: Ambulatory Visit | Attending: Physician Assistant | Admitting: Physician Assistant

## 2021-07-09 ENCOUNTER — Ambulatory Visit: Payer: Medicaid Other | Admitting: Allergy and Immunology

## 2021-07-09 DIAGNOSIS — M5459 Other low back pain: Secondary | ICD-10-CM

## 2021-07-11 NOTE — Telephone Encounter (Signed)
Inbound call from patient states she need office notes from her last couple visits faxed to [facility] for [place] with claim #: [id number]

## 2021-07-16 ENCOUNTER — Other Ambulatory Visit (HOSPITAL_COMMUNITY): Payer: Self-pay

## 2021-07-17 ENCOUNTER — Other Ambulatory Visit (HOSPITAL_COMMUNITY): Payer: Self-pay

## 2021-07-17 MED ORDER — BUPRENORPHINE 10 MCG/HR TD PTWK
1.0000 | MEDICATED_PATCH | TRANSDERMAL | 0 refills | Status: DC
Start: 2021-07-17 — End: 2021-08-09
  Filled 2021-07-17: qty 4, 28d supply, fill #0

## 2021-07-18 ENCOUNTER — Other Ambulatory Visit (HOSPITAL_COMMUNITY): Payer: Self-pay

## 2021-07-18 NOTE — Telephone Encounter (Signed)
Patient called inquiring about cortisol hormone stimulation test and the second injection of cortisol. Wanting to know if this is done in the office or at the Lab on [address]. Please advise via My Chart with instructions. Also, there are no active orders listed for Labs on this patient. Patient cannot schedule Lab appt on Monday or Wednesday.

## 2021-07-19 ENCOUNTER — Encounter: Payer: Self-pay | Admitting: Allergy and Immunology

## 2021-07-19 ENCOUNTER — Other Ambulatory Visit (HOSPITAL_COMMUNITY): Payer: Self-pay

## 2021-07-19 ENCOUNTER — Other Ambulatory Visit: Payer: Self-pay | Admitting: Internal Medicine

## 2021-07-19 MED ORDER — SCOPOLAMINE 1 MG/3DAYS TD PT72
1.0000 | MEDICATED_PATCH | TRANSDERMAL | 0 refills | Status: DC
  Filled 2021-07-19: qty 8, 24d supply, fill #0
  Filled 2021-08-09: qty 10, 30d supply, fill #0

## 2021-07-19 MED ORDER — PROMETHAZINE HCL 25 MG PO TABS
25.0000 mg | ORAL_TABLET | Freq: Four times a day (QID) | ORAL | 0 refills | Status: DC | PRN
Start: 2021-07-19 — End: 2021-08-09
  Filled 2021-07-19 – 2021-07-24 (×2): qty 120, 30d supply, fill #0

## 2021-07-19 MED ORDER — GABAPENTIN 300 MG PO CAPS
300.0000 mg | ORAL_CAPSULE | Freq: Every day | ORAL | 1 refills | Status: DC
Start: 2021-07-19 — End: 2022-01-23
  Filled 2021-07-19: qty 90, 90d supply, fill #0
  Filled 2021-07-29 – 2022-01-18 (×2): qty 90, 90d supply, fill #1

## 2021-07-19 MED ORDER — TEMAZEPAM 7.5 MG PO CAPS
7.5000 mg | ORAL_CAPSULE | Freq: Every evening | ORAL | 0 refills | Status: DC | PRN
  Filled 2021-07-19: qty 90, 90d supply, fill #0

## 2021-07-19 MED FILL — Ondansetron Orally Disintegrating Tab 8 MG: ORAL | 20 days supply | Qty: 60 | Fill #3 | Status: CN

## 2021-07-19 MED FILL — Ondansetron Orally Disintegrating Tab 8 MG: ORAL | 20 days supply | Qty: 60 | Fill #0 | Status: CN

## 2021-07-22 ENCOUNTER — Other Ambulatory Visit (HOSPITAL_COMMUNITY): Payer: Self-pay

## 2021-07-23 NOTE — Progress Notes (Deleted)
Primary Physician/Referring:  Deon Pilling, NP  Patient ID: Linda Becker, female    DOB: August 10, 1990, 31 y.o.   MRN: [id number]  No chief complaint on file.  HPI:    Linda Becker  is a 31 y.o. Caucasian female with history of migraines, liver cirrhosis secondary to NASH/portal hypertension, thrombocytopenia, hypothyroidism, and chronic palpitations.  Patient was referred back to our office for evaluation of tachycardia, echocardiogram was essentially normal.  She continues to follow with PCP for management of anemia and thyroid dysfunction.  Patient did have gastric sleeve procedure done in January 2021, unfortunately she has gained approximately 30 pounds since then.  Patient had GI bleed in 01/2020 secondary to esophageal varices.  Due to underlying cirrhosis patient has splenomegaly and thrombocytopenia as well as chronic anemia all of which are presently managed by GI.  She is currently being evaluated by [facility] for liver transplant.  Patient presents for 22-monthfollow-up of palpitations.  Office visit increase verapamil from 120 mg to 180 mg daily and ordered repeat ambulatory cardiac telemetry, however this has not been done. ***  ***TSH now normal. HGB improved to 11.4.   Patient was seen in our office 03/26/2021 with concerns of tachycardia.  Given episodes of elevated heart rate and new right axis deviation and EKG ordered echocardiogram which noted normal LVEF at 60% with grade 1 diastolic dysfunction mild valvular disease, as well as mild LVH.  Also last office visit started patient on from verapamil 120 mg daily as shared decision was to hold off on repeat cardiac monitor at this time.  Notably patient also has anemia and thyroid dysfunction, for which she is following with PCP.   Patient reports that since starting verapamil she has had less frequent episodes of palpitations, however continues to report elevated heart rate with maximum heart rate 110 bpm.  She is feeling well  overall, however she has not followed up with PCP for management of thyroid dysfunction.  Patient denies chest pain, dizziness, syncope, near syncope.  She is presently taking nadolol for portal hypertension. Notably patient is not pregnant and does not plan to become pregnant, she take prenatal vitamins for her hair and nails.   Past Medical History:  Diagnosis Date   Allergic rhinitis    remote hx of allergy shots   Asthma    since childhood  on controller meds extrinsic dr kCarmelina Peal  Bipolar II disorder (HCC)    [place]    [place] ([place]    Family history of adverse reaction to anesthesia    mom and sister difficult to awaken per patient    Gallbladder sludge    Gastropathy    GERD (gastroesophageal reflux disease)    on nexium  for  long term sx since childhood   H/O miscarriage, not currently pregnant    [redacted] weeks  march 2016   Hepatic steatosis    Hiatal hernia    Hypothyroidism    Migraine    Murmur    pt reports MVP   [place]al hypertension ([place]    Sessile colonic polyp    Splenomegaly    Steatohepatitis    Syncope    under eval ? cause   Tachycardia    episodes with near syncope eval dr GNadyne Coombes on no meds dced LABA   [place]    Upper GI bleed    Past Surgical History:  Procedure Laterality Date   BIOPSY  09/03/2018   Procedure: BIOPSY;  Surgeon:  Pyrtle, [name], MD;  [facility];  Service: Gastroenterology;;   BIOPSY  09/26/2020   Procedure: BIOPSY;  Surgeon: Jerene Bears, MD;  Location: [facility];  Service: Gastroenterology;;   broken right femur  2008   rod placed   COLONOSCOPY WITH PROPOFOL N/A 09/03/2018   Procedure: COLONOSCOPY WITH PROPOFOL;  Surgeon: Jerene Bears, MD;  Location: [facility];  Service: Gastroenterology;  Laterality: N/A;   DILATION AND CURETTAGE OF UTERUS     ESOPHAGOGASTRODUODENOSCOPY (EGD) WITH PROPOFOL N/A 09/03/2018   Procedure: ESOPHAGOGASTRODUODENOSCOPY (EGD) WITH PROPOFOL;  Surgeon: Jerene Bears, MD;  [facility]: [facility];  Service: Gastroenterology;  Laterality: N/A;   ESOPHAGOGASTRODUODENOSCOPY (EGD) WITH PROPOFOL N/A 01/27/2020   Procedure: ESOPHAGOGASTRODUODENOSCOPY (EGD) WITH PROPOFOL;  Surgeon: Rush Landmark Telford Nab., MD;  Location: [place];  Service: Gastroenterology;  Laterality: N/A;   ESOPHAGOGASTRODUODENOSCOPY (EGD) WITH PROPOFOL N/A 03/12/2020   Procedure: ESOPHAGOGASTRODUODENOSCOPY (EGD) WITH PROPOFOL;  Surgeon: Jerene Bears, MD;  [facility];  Service: Gastroenterology;  Laterality: N/A;   ESOPHAGOGASTRODUODENOSCOPY (EGD) WITH PROPOFOL N/A 09/26/2020   Procedure: ESOPHAGOGASTRODUODENOSCOPY (EGD) WITH PROPOFOL;  Surgeon: Jerene Bears, MD;  [facility];  Service: Gastroenterology;  Laterality: N/A;   ESOPHAGOGASTRODUODENOSCOPY (EGD) WITH PROPOFOL N/A 04/29/2021   Procedure: ESOPHAGOGASTRODUODENOSCOPY (EGD) WITH PROPOFOL;  Surgeon: Jerene Bears, MD;  Location: [facility];  Service: Gastroenterology;  Laterality: N/A;   GASTRIC VARICES BANDING  01/27/2020   Procedure: GASTRIC VARICES BANDING;  Surgeon: Rush Landmark Telford Nab., MD;  Location: [place];  Service: Gastroenterology;;   HOT HEMOSTASIS N/A 04/29/2021   Procedure: HOT HEMOSTASIS (ARGON PLASMA COAGULATION/BICAP);  Surgeon: Jerene Bears, MD;  Location: [facility];  Service: Gastroenterology;  Laterality: N/A;   IR TRANSCATHETER BX  01/19/2019   IR US GUIDE VASC ACCESS RIGHT  01/19/2019   IR VENOGRAM HEPATIC W HEMODYNAMIC EVALUATION  01/19/2019   LAPAROSCOPIC GASTRIC SLEEVE RESECTION N/A 06/28/2019   Procedure: LAPAROSCOPIC GASTRIC SLEEVE RESECTION, Upper Endo, ERAS Pathway;  Surgeon: Clovis Riley, MD;  Location: [facility];  Service: General;  Laterality: N/A;   OB ultrasound N/A 12/01/2017   see report   POLYPECTOMY  09/03/2018   Procedure: POLYPECTOMY;  Surgeon: Jerene Bears, MD;  Location: [facility];  Service: Gastroenterology;;   TONSILLECTOMY  2006   WISDOM TOOTH  EXTRACTION     Family History  Problem Relation Age of Onset   Liver disease Mother 44       advanced non-alcoholic cirrhosis   Arthritis Mother    Hyperlipidemia Mother    Heart disease Mother    Hypertension Mother    Diabetes Mother    Kidney disease Mother    Bleeding Disorder Mother    Colon polyps Mother    Asthma Father    Arthritis Father    Hyperlipidemia Father    Heart disease Father    Hypertension Father    Diabetes Father    Colon polyps Father    Breast cancer Maternal Aunt    Breast cancer Paternal Aunt    Esophageal cancer Paternal Aunt    Arthritis Maternal Grandmother    Breast cancer Maternal Grandmother    Thyroid disease Maternal Grandmother    Diabetes Maternal Grandfather    Pancreatic cancer Maternal Grandfather    Breast cancer Paternal Grandmother    Diabetes Paternal Grandmother    Colon cancer Paternal Grandfather    Diabetes Paternal Grandfather    Liver cancer Paternal Grandfather    Stomach cancer Paternal Grandfather  Social History   Tobacco Use   Smoking status: Never   Smokeless tobacco: Never  Substance Use Topics   Alcohol use: No    Alcohol/week: 0.0 standard drinks   Marital Status: Married   ROS  Review of Systems  Constitutional: Negative for malaise/fatigue and weight gain.  Cardiovascular:  Positive for palpitations. Negative for chest pain, claudication, leg swelling, near-syncope, orthopnea, paroxysmal nocturnal dyspnea and syncope.  Respiratory:  Positive for shortness of breath.   Neurological:  Negative for dizziness.   Objective  Last menstrual period 07/08/2021.  Vitals with BMI 06/19/2021 06/11/2021 06/05/2021  Height - 5' 5.354" 5' 6"   Weight - 299 lbs 3 oz 301 lbs 10 oz  BMI - 26.33 35.4  Systolic 562 563 92  Diastolic 83 60 70  Pulse 87 105 73  Some encounter information is confidential and restricted. Go to Review Flowsheets activity to see all data.   No data found.   Physical Exam Vitals  reviewed.  Constitutional:      Appearance: She is obese.  Cardiovascular:     Rate and Rhythm: Normal rate and regular rhythm.     Pulses: Intact distal pulses.     Heart sounds: S1 normal and S2 normal. No murmur heard.   No gallop.  Pulmonary:     Effort: Pulmonary effort is normal. No respiratory distress.     Breath sounds: No wheezing, rhonchi or rales.  Musculoskeletal:     Right lower leg: No edema.     Left lower leg: No edema.  Neurological:     Mental Status: She is alert.     Laboratory examination:   Recent Labs    01/20/21 1605 01/25/21 1136 03/15/21 1338 05/06/21 1016 05/23/21 0935 06/03/21 0919 06/20/21 1055  NA 144   < > 136 138 138  --  139  K 4.4   < > 4.1 4.9 4.1  --  4.2  CL 112*   < > 105 111 109  --  109  CO2 23   < > 23 21* 23  --  25  GLUCOSE 102*   < > 135* 114* 127* 96 151*  BUN 9   < > 11 13 12   --  14  CREATININE 0.82   < > 0.95 0.86 0.87  --  0.84  CALCIUM 8.7*   < > 7.9* 8.5* 8.8  --  8.8  GFRNONAA >60  --  >60 >60  --   --   --    < > = values in this interval not displayed.    CrCl cannot be calculated (Patient's most recent lab result is older than the maximum 21 days allowed.).  CMP Latest Ref Rng & Units 06/20/2021 06/03/2021 05/23/2021  Glucose 70 - 99 mg/dL 151(H) 96 127(H)  BUN 6 - 23 mg/dL 14 - 12  Creatinine 0.40 - 1.20 mg/dL 0.84 - 0.87  Sodium 135 - 145 mEq/L 139 - 138  Potassium 3.5 - 5.1 mEq/L 4.2 - 4.1  Chloride 96 - 112 mEq/L 109 - 109  CO2 19 - 32 mEq/L 25 - 23  Calcium 8.4 - 10.5 mg/dL 8.8 - 8.8  Total Protein 6.0 - 8.3 g/dL 6.3 - 6.2  Total Bilirubin 0.2 - 1.2 mg/dL 0.4 - 0.3  Alkaline Phos 39 - 117 U/L 74 - 69  AST 0 - 37 U/L 13 - 15  ALT 0 - 35 U/L 16 - 15   CBC Latest Ref Rng & Units 06/20/2021  05/23/2021 05/06/2021  WBC 4.0 - 10.5 K/uL 6.9 5.0 6.2  Hemoglobin 12.0 - 15.0 g/dL 11.4(L) 10.5(L) 10.2(L)  Hematocrit 36.0 - 46.0 % 35.5(L) 33.5(L) 34.8(L)  Platelets 150.0 - 400.0 K/uL 122.0(L) 82.0(L) 129(L)     Lipid Panel No results for input(s): CHOL, TRIG, LDLCALC, VLDL, HDL, CHOLHDL, LDLDIRECT in the last 8760 hours.  HEMOGLOBIN A1C Lab Results  Component Value Date   HGBA1C 4.3 (L) 06/03/2021   TSH Recent Labs    06/03/21 0919  TSH 1.05    External labs:  03/18/2021: Hgb 9.6, platelet 95 Sodium 139, potassium 4.9, BUN 9, creatinine 1.11, ALT 21, AST 20 A1c 5.2% TSH 6.07  Allergies   Allergies  Allergen Reactions   Progesterone Rash    Was in a form of birth control.   Penicillins Rash    Did it involve swelling of the face/tongue/throat, SOB, or low BP? No Did it involve sudden or severe rash/hives, skin peeling, or any reaction on the inside of your mouth or nose? Yes Did you need to seek medical attention at a hospital or doctor's office? No When did it last happen?      9 + months If all above answers are NO, may proceed with cephalosporin use.      Medications Prior to Visit:   Outpatient Medications Prior to Visit  Medication Sig Dispense Refill   albuterol (PROVENTIL) (2.5 MG/3ML) 0.083% nebulizer solution Inhale 1 ampule by nebulization every 4 (four) hours as needed for wheezing or shortness of breath. 90 mL 1   albuterol (VENTOLIN HFA) 108 (90 Base) MCG/ACT inhaler Inhale 1 puff by mouth every 4 hours as needed 18 g 1   Albuterol Sulfate, sensor, (PROAIR DIGIHALER) 108 (90 Base) MCG/ACT AEPB Inhale 2 puffs into the lungs every 4 hours as needed (For coughing and wheezing). (Patient not taking: Reported on 06/11/2021) 1 each 3   ARIPiprazole (ABILIFY) 5 MG tablet Take 1 tablet (5 mg total) by mouth daily. 30 tablet 1   Ascorbic Acid (VITAMIN C) 1000 MG tablet Take 2,000 mg by mouth 2 (two) times daily.     BREZTRI AEROSPHERE 160-9-4.8 MCG/ACT AERO Inhale 2 puffs into the lungs 2 (two) times daily. 10.7 g 5   budesonide (PULMICORT) 0.25 MG/2ML nebulizer solution Inhale 1 ampule by nebulization 2 (two) times daily. 60 mL 1   budesonide (RHINOCORT AQUA) 32  MCG/ACT nasal spray Place 1 spray into both nostrils daily. 9 mL 5   budesonide-formoterol (SYMBICORT) 160-4.5 MCG/ACT inhaler Inale 2 puffs by mouth twice a day 10.2 g 1   buprenorphine (BUTRANS) 10 MCG/HR PTWK Place 1 patch onto the skin once a week. 4 patch 0   CALCIUM PO Take 1 tablet by mouth 2 (two) times daily.     Cetirizine HCl 10 MG CAPS Take 10 mg by mouth 2 (two) times daily.      chlorpheniramine-HYDROcodone (TUSSIONEX) 10-8 MG/5ML SUER Take 2.5 - 25ms by mouth at bedtime as needed for cough. (Patient not taking: Reported on 06/11/2021) 30 mL 0   chlorpheniramine-HYDROcodone (TUSSIONEX) 10-8 MG/5ML SUER Take 2.5 - 5 mLs by mouth at bedtime as needed for cough. (Patient not taking: Reported on 06/11/2021) 30 mL 0   dextromethorphan-guaiFENesin (MUCINEX DM) 30-600 MG 12hr tablet Take 1 tablet by mouth 2 (two) times daily.     diphenhydrAMINE (BENADRYL) 25 MG tablet Take 50 mg by mouth at bedtime as needed for allergies or sleep.     escitalopram (LEXAPRO) 10 MG tablet  Take 1 tablet by mouth once daily 30 tablet 1   escitalopram (LEXAPRO) 20 MG tablet Take 1 tablet (20 mg total) by mouth daily. 90 tablet 1   famotidine (PEPCID) 40 MG tablet Take 1 tablet (40 mg total) by mouth 2 (two) times daily. 60 tablet 2   Fluticasone-Salmeterol,sensor, (AIRDUO DIGIHALER) 232-14 MCG/ACT AEPB Inhale 1 puff into the lungs in the morning and at bedtime. (Patient not taking: Reported on 06/11/2021) 1 each 5   gabapentin (NEURONTIN) 300 MG capsule Take 1 capsule (300 mg total) by mouth daily. 90 capsule 1   HYDROcodone bit-homatropine (HYDROMET) 5-1.5 MG/5ML syrup Take 5 mL by mouth every 6 hours as needed for 7 days (Patient not taking: Reported on 06/11/2021) 140 mL 0   HYDROcodone-acetaminophen (NORCO/VICODIN) 5-325 MG tablet Take 1 tablet by mouth up to 3 (three) times daily as needed. 90 tablet 0   loratadine (CLARITIN) 10 MG tablet Take 1 tablet by mouth 2 times daily as needed for allergies. 60  tablet 5   nadolol (CORGARD) 20 MG tablet Take 1 tablet (20 mg total) by mouth 2 (two) times daily. 60 tablet 3   Olopatadine HCl 0.2 % SOLN Place 1 drop into both eyes daily as needed 7.5 mL 1   ondansetron (ZOFRAN-ODT) 8 MG disintegrating tablet DISSOLVE 1 TABLET BY MOUTH EVERY 8 HOURS AS NEEDED FOR NAUSEA OR VOMITING (Patient taking differently: Take 8 mg by mouth every 8 (eight) hours as needed for nausea or vomiting.) 60 tablet 3   pantoprazole (PROTONIX) 40 MG tablet Take 1 tablet (40 mg total) by mouth 2 (two) times daily before a meal. 60 tablet 3   Prenatal Vit-Fe Fumarate-FA (MULTIVITAMIN-PRENATAL) 27-0.8 MG TABS tablet Take 1 tablet by mouth 2 (two) times daily.     promethazine (PHENERGAN) 25 MG tablet Take 1 tablet by mouth every 6 hours as needed for vomiting or nausea. Do not fill until 07/24/21. 120 tablet 0   scopolamine (TRANSDERM-SCOP) 1 MG/3DAYS Place 1 patch (1.5 mg total) onto the skin every 3 (three) days. 10 patch 0   Sodium Fluoride 1.1 % PSTE Brush at bedtime (Do not rinse) 100 mL 1   sucralfate (CARAFATE) 1 GM/10ML suspension Take 10 mLs by mouth 4 times daily -  with meals and at bedtime. 420 mL 1   temazepam (RESTORIL) 7.5 MG capsule Take 1 capsule (7.5 mg total) by mouth at bedtime as needed. 90 capsule 0   ursodiol (ACTIGALL) 300 MG capsule Take 1 capsule (300 mg total) by mouth 2 (two) times daily (Patient not taking: Reported on 06/05/2021) 60 capsule 11   verapamil (CALAN-SR) 180 MG CR tablet Take 1 tablet (180 mg total) by mouth at bedtime. 30 tablet 3   VITAMIN D PO Take 1 capsule by mouth 2 (two) times daily.     VITAMIN E PO Take 1 capsule by mouth daily.     Facility-Administered Medications Prior to Visit  Medication Dose Route Frequency Provider Last Rate Last Admin   promethazine (PHENERGAN) tablet 12.5-25 mg  12.5-25 mg Oral Q4H PRN Clovis Riley, MD       Or   promethazine (PHENERGAN) suppository 12.5-25 mg  12.5-25 mg Rectal Q4H PRN Romana Juniper  A, MD       sodium chloride 0.9 % 1,000 mL with thiamine 149 mg, folic acid 1 mg, multivitamins adult 10 mL infusion   Intravenous Continuous Clovis Riley, MD       Final Medications at End of  Visit    No outpatient medications have been marked as taking for the 07/25/21 encounter (Appointment) with Rayetta Pigg, Estel Scholze C, PA-C.   Radiology:   No results found.  Cardiac Studies:   Echocardiogram 07/21/2017: Left ventricle cavity is normal in size. Mild concentric hypertrophy of the left ventricle. Normal global wall motion. Visual EF is 60-65%. Normal diastolic filling pattern. No significant valvular abnormality No evidence of pulmonary hypertension. No significant change compared to prior study dated 04/12/2015.   Event Monitor 30 days 07/07/2017: NSR. Symptoms of fatigue and palpitations: NSR. No other significant arrhythmias noted.  PCV ECHOCARDIOGRAM COMPLETE 04/04/2021 Left ventricle cavity is normal in size. Mild concentric hypertrophy of the left ventricle. Normal global wall motion. Normal LV systolic function with EF 60%. Doppler evidence of grade I (impaired) diastolic dysfunction, normal LAP. Mild (Grade I) mitral regurgitation. Mild tricuspid regurgitation. No evidence of pulmonary hypertension. Compared to previous study in 2019, mild MR, mild TR are new.   EKG:   03/26/2021: Sinus rhythm at a rate of 99 bpm.  Right axis.  LVH.  Nonspecific T wave abnormality.  03/17/2019: Normal sinus rhythm at 88 bpm, normal axis, no evidence of ischemia.   Assessment   No diagnosis found.    There are no discontinued medications.   No orders of the defined types were placed in this encounter.   Recommendations:   Linda Becker is a 31 y.o. Caucasian female with history of migraines, liver cirrhosis secondary to NASH/portal hypertension, thrombocytopenia, GI bleed in 01/2020 secondary to esophageal varices, splenomegaly, chronic anemia, hypothyroidism, and chronic  palpitations. Patient had gastric sleeve procedure done in January/2021 unfortunately she has gained approximately 30 pounds since then.  She is currently being evaluated by [facility] for liver transplant.  Patient presents for 8-monthfollow-up of palpitations.  Office visit increase verapamil from 120 mg to 180 mg daily and ordered repeat ambulatory cardiac telemetry, however this has not been done. ***  ***TSH now normal. HGB improved to 11.4.   Patient was seen in our office 03/26/2021 with concerns of tachycardia.  Given episodes of elevated heart rate and new right axis deviation and EKG ordered echocardiogram which noted normal LVEF at 60% with grade 1 diastolic dysfunction mild valvular disease, as well as mild LVH.  Also last office visit started patient on from Aptamil 120 mg daily as shared decision was to hold off on repeat cardiac monitor at this time.  Notably patient also has anemia and thyroid dysfunction, for which she is following with PCP.  Reviewed and discussed with patient results of echocardiogram, details above.  Echocardiogram is essentially normal.  Advised patient regarding the importance of following up with PCP for management of thyroid dysfunction and anemia, both of which may be contributing to patient's palpitations and tachycardia.  As patient continues to have episodes of palpitations will increase verapamil from 120 mg to 180 mg daily.  We will also obtain 3-day cardiac monitor to evaluate for underlying cardiac arrhythmias.  Follow-up in 3 months, sooner if needed, for palpitations and tachycardia.   CAlethia Berthold PA-C 07/23/2021, 8:41 AM Office: [phone] 66872/12/2021, 8:41 AM Office: [phone]

## 2021-07-24 ENCOUNTER — Other Ambulatory Visit (HOSPITAL_COMMUNITY): Payer: Self-pay

## 2021-07-24 ENCOUNTER — Other Ambulatory Visit (INDEPENDENT_AMBULATORY_CARE_PROVIDER_SITE_OTHER): Payer: Managed Care, Other (non HMO)

## 2021-07-24 ENCOUNTER — Ambulatory Visit (INDEPENDENT_AMBULATORY_CARE_PROVIDER_SITE_OTHER): Payer: Managed Care, Other (non HMO)

## 2021-07-24 ENCOUNTER — Other Ambulatory Visit: Payer: Self-pay

## 2021-07-24 ENCOUNTER — Other Ambulatory Visit: Payer: Self-pay | Admitting: Endocrinology

## 2021-07-24 DIAGNOSIS — I951 Orthostatic hypotension: Secondary | ICD-10-CM

## 2021-07-24 DIAGNOSIS — R55 Syncope and collapse: Secondary | ICD-10-CM

## 2021-07-24 DIAGNOSIS — E2740 Unspecified adrenocortical insufficiency: Secondary | ICD-10-CM | POA: Diagnosis not present

## 2021-07-24 MED ORDER — COSYNTROPIN 0.25 MG IJ SOLR
0.2500 mg | Freq: Once | INTRAMUSCULAR | Status: AC
  Administered 2021-07-24: 0.25 mg via INTRAVENOUS

## 2021-07-24 NOTE — Progress Notes (Signed)
Cosyntropin administered @1 :50 into left arm. Verified injection with the patient and consent given.

## 2021-07-24 NOTE — Telephone Encounter (Signed)
Patient was scheduled this morning for Lab and Nurse visit on 07/24/21 and a follow up with Dr. Dwyane Dee on 07/26/21.

## 2021-07-25 ENCOUNTER — Ambulatory Visit: Payer: Medicaid Other | Admitting: Student

## 2021-07-25 DIAGNOSIS — R002 Palpitations: Secondary | ICD-10-CM

## 2021-07-25 DIAGNOSIS — R000 Tachycardia, unspecified: Secondary | ICD-10-CM

## 2021-07-25 LAB — CORTISOL
Cortisol, Plasma: 0.7 ug/dL
Cortisol, Plasma: 11.4 ug/dL

## 2021-07-26 ENCOUNTER — Other Ambulatory Visit: Payer: Self-pay

## 2021-07-26 ENCOUNTER — Ambulatory Visit: Payer: Managed Care, Other (non HMO) | Admitting: Endocrinology

## 2021-07-26 ENCOUNTER — Encounter: Payer: Self-pay | Admitting: Endocrinology

## 2021-07-26 VITALS — BP 102/70 | HR 84 | Ht 66.0 in | Wt 304.8 lb

## 2021-07-26 DIAGNOSIS — R5383 Other fatigue: Secondary | ICD-10-CM | POA: Diagnosis not present

## 2021-07-26 DIAGNOSIS — G90A Postural orthostatic tachycardia syndrome (POTS): Secondary | ICD-10-CM | POA: Diagnosis not present

## 2021-07-26 DIAGNOSIS — I951 Orthostatic hypotension: Secondary | ICD-10-CM

## 2021-07-26 DIAGNOSIS — E039 Hypothyroidism, unspecified: Secondary | ICD-10-CM

## 2021-07-26 NOTE — Progress Notes (Signed)
Patient ID: Linda Becker, female   DOB: 1991/03/02, 31 y.o.   MRN: [id number]             Reason for Appointment: Endocrinology follow-up visit    History of Present Illness:    LOW BLOOD pressure issues:  She thinks that for the last year or more she has had symptoms of lightheadedness on standing up However she has a history of near syncope as far back as 2016 Also has been having issues with sinus tachycardia controlled with beta-blockers  She has had chronic nausea since her gastric sleeve procedure Has not had any weight loss or anorexia. Her appetite fluctuates, sometimes may have binging and other times somewhat decreased appetite No diarrhea but her bowels alternate between normal and soft  She was told by her gynecologist to have her adrenal glands evaluated because of low DHEA-S level Cortisol levels checked in the afternoon were 0.7 baseline and 11.4 after Cortrosyn However she had an injection of Solu-Medrol on 06/04/2021 along with oral steroids for her asthma   HYPOTHYROIDISM   PREVIOUS history: Hypothyroidism was first diagnosed in 10/2016 At the time of diagnosis patient was having symptoms of  fatigue, cold sensitivity, weight gain, brittle nails and hair loss .          However her symptoms had been present for several years and not any worse She was tested with her TSH because of needing to start lithium therapy Baseline TSH was 6.7, no baseline free T4 or free T3 available  Prior to consultation she had been treated with  liothyronine, initially 25 g daily and then 50 g by her psychiatrist for management of her high TSH The patient did not subjectively feeling any better with taking the liothyronine; with this she was having palpitations, shortness of breath and feeling excessively warm and sweaty. Because of her symptoms she was told to stop the liothyronine and have baseline labs done again  RECENT history:  She was last seen in 2019 in early  pregnancy when TSH was 5.2 Not clear if she was treated by her gynecologist for this but subsequently has not been on any supplements  She complains of significant fatigue for several months Also has had recent weight gain She has been referred here because of history of rapid heart rate for the last several months  However her TSH is normal along with free T4          Patient's weight history is as follows:  Wt Readings from Last 3 Encounters:  07/26/21 (!) 304 lb 12.8 oz (138.3 kg)  06/11/21 299 lb 3.2 oz (135.7 kg)  06/05/21 (!) 301 lb 9.6 oz (136.8 kg)    TPO antibody previously negative  Thyroid function results have been as follows:  Lab Results  Component Value Date   TSH 1.05 06/03/2021   TSH 0.72 05/01/2020   TSH 1.87 09/27/2019   TSH 1.59 03/11/2019   FREET4 0.69 06/03/2021   FREET4 0.73 09/27/2019   FREET4 0.99 05/26/2018   FREET4 0.95 02/10/2018   T3FREE 3.4 09/27/2019   T3FREE 3.7 12/18/2017   T3FREE 3.5 06/29/2017    02/05/17: Free thyroxine index low at 1.0    Past Medical History:  Diagnosis Date   Allergic rhinitis    remote hx of allergy shots   Asthma    since childhood  on controller meds extrinsic dr Carmelina Peal   Bipolar II disorder ([place])    Cirrhosis ([place])    Esophageal varices (  [place])    Family history of adverse reaction to anesthesia    mom and sister difficult to awaken per patient    Gallbladder sludge    Gastropathy    GERD (gastroesophageal reflux disease)    on nexium  for  long term sx since childhood   H/O miscarriage, not currently pregnant    [redacted] weeks  march 2016   Hepatic steatosis    Hiatal hernia    Hypothyroidism    Migraine    Murmur    pt reports MVP   Portal hypertension (H[facility])    Sessile colonic polyp    Splenomegaly    Steatohepatitis    Syncope    under eval ? cause   Tachycardia    episodes with near syncope eval dr Nadyne Coombes  on no meds dced LABA   Thrombocytopenia ([place])    Upper GI bleed     Past Surgical  History:  Procedure Laterality Date   BIOPSY  09/03/2018   Procedure: BIOPSY;  Surgeon: Jerene Bears, MD;  Location: [facility];  Service: Gastroenterology;;   BIOPSY  09/26/2020   Procedure: BIOPSY;  Surgeon: Jerene Bears, MD;  Location: [facility];  Service: Gastroenterology;;   broken right femur  2008   rod placed   COLONOSCOPY WITH PROPOFOL N/A 09/03/2018   Procedure: COLONOSCOPY WITH PROPOFOL;  Surgeon: Jerene Bears, MD;  Location: [facility];  Service: Gastroenterology;  Laterality: N/A;   DILATION AND CURETTAGE OF UTERUS     ESOPHAGOGASTRODUODENOSCOPY (EGD) WITH PROPOFOL N/A 09/03/2018   Procedure: ESOPHAGOGASTRODUODENOSCOPY (EGD) WITH PROPOFOL;  Surgeon: Jerene Bears, MD;  Location: [facility];  Service: Gastroenterology;  Laterality: N/A;   ESOPHAGOGASTRODUODENOSCOPY (EGD) WITH PROPOFOL N/A 01/27/2020   Procedure: ESOPHAGOGASTRODUODENOSCOPY (EGD) WITH PROPOFOL;  Surgeon: Rush Landmark Telford Nab., MD;  Location: [place];  Service: Gastroenterology;  Laterality: N/A;   ESOPHAGOGASTRODUODENOSCOPY (EGD) WITH PROPOFOL N/A 03/12/2020   Procedure: ESOPHAGOGASTRODUODENOSCOPY (EGD) WITH PROPOFOL;  Surgeon: Jerene Bears, MD;  [facility];  Service: Gastroenterology;  Laterality: N/A;   ESOPHAGOGASTRODUODENOSCOPY (EGD) WITH PROPOFOL N/A 09/26/2020   Procedure: ESOPHAGOGASTRODUODENOSCOPY (EGD) WITH PROPOFOL;  Surgeon: Jerene Bears, MD;  [facility];  Service: Gastroenterology;  Laterality: N/A;   ESOPHAGOGASTRODUODENOSCOPY (EGD) WITH PROPOFOL N/A 04/29/2021   Procedure: ESOPHAGOGASTRODUODENOSCOPY (EGD) WITH PROPOFOL;  Surgeon: Jerene Bears, MD;  Location: [facility];  Service: Gastroenterology;  Laterality: N/A;   GASTRIC VARICES BANDING  01/27/2020   Procedure: GASTRIC VARICES BANDING;  Surgeon: Rush Landmark Telford Nab., MD;  Location: [place];  Service: Gastroenterology;;   HOT HEMOSTASIS N/A 04/29/2021   Procedure: HOT HEMOSTASIS (ARGON PLASMA  COAGULATION/BICAP);  Surgeon: Jerene Bears, MD;  Location: [facility];  Service: Gastroenterology;  Laterality: N/A;   IR TRANSCATHETER BX  01/19/2019   IR US GUIDE VASC ACCESS RIGHT  01/19/2019   IR VENOGRAM HEPATIC W HEMODYNAMIC EVALUATION  01/19/2019   LAPAROSCOPIC GASTRIC SLEEVE RESECTION N/A 06/28/2019   Procedure: LAPAROSCOPIC GASTRIC SLEEVE RESECTION, Upper Endo, ERAS Pathway;  Surgeon: Clovis Riley, MD;  Location: [facility];  Service: General;  Laterality: N/A;   OB ultrasound N/A 12/01/2017   see report   POLYPECTOMY  09/03/2018   Procedure: POLYPECTOMY;  Surgeon: Jerene Bears, MD;  Location: [facility];  Service: Gastroenterology;;   TONSILLECTOMY  2006   WISDOM TOOTH EXTRACTION      Family History  Problem Relation Age of Onset   Liver disease Mother 37       advanced non-alcoholic  cirrhosis   Arthritis Mother    Hyperlipidemia Mother    Heart disease Mother    Hypertension Mother    Diabetes Mother    Kidney disease Mother    Bleeding Disorder Mother    Colon polyps Mother    Asthma Father    Arthritis Father    Hyperlipidemia Father    Heart disease Father    Hypertension Father    Diabetes Father    Colon polyps Father    Breast cancer Maternal Aunt    Breast cancer Paternal Aunt    Esophageal cancer Paternal Aunt    Arthritis Maternal Grandmother    Breast cancer Maternal Grandmother    Thyroid disease Maternal Grandmother    Diabetes Maternal Grandfather    Pancreatic cancer Maternal Grandfather    Breast cancer Paternal Grandmother    Diabetes Paternal Grandmother    Colon cancer Paternal Grandfather    Diabetes Paternal Grandfather    Liver cancer Paternal Grandfather    Stomach cancer Paternal Grandfather     Social History:  reports that she has never smoked. She has never used smokeless tobacco. She reports that she does not drink alcohol and does not use drugs.  Allergies:  Allergies  Allergen Reactions   Progesterone Rash    Was in a form  of birth control.   Penicillins Rash    Did it involve swelling of the face/tongue/throat, SOB, or low BP? No Did it involve sudden or severe rash/hives, skin peeling, or any reaction on the inside of your mouth or nose? Yes Did you need to seek medical attention at a hospital or doctor's office? No When did it last happen?      9 + months If all above answers are NO, may proceed with cephalosporin use.      Allergies as of 07/26/2021       Reactions   Progesterone Rash   Was in a form of birth control.   Penicillins Rash   Did it involve swelling of the face/tongue/throat, SOB, or low BP? No Did it involve sudden or severe rash/hives, skin peeling, or any reaction on the inside of your mouth or nose? Yes Did you need to seek medical attention at a hospital or doctor's office? No When did it last happen?      9 + months If all above answers are NO, may proceed with cephalosporin use.        Medication List        Accurate as of July 26, 2021 11:05 AM. If you have any questions, ask your nurse or doctor.          AirDuo Digihaler 232-14 MCG/ACT Aepb Generic drug: Fluticasone-Salmeterol(sensor) Inhale 1 puff into the lungs in the morning and at bedtime.   albuterol 108 (90 Base) MCG/ACT inhaler Commonly known as: Ventolin HFA Inhale 1 puff by mouth every 4 hours as needed   albuterol (2.5 MG/3ML) 0.083% nebulizer solution Commonly known as: PROVENTIL Inhale 1 ampule by nebulization every 4 (four) hours as needed for wheezing or shortness of breath.   ARIPiprazole 5 MG tablet Commonly known as: ABILIFY Take 1 tablet (5 mg total) by mouth daily.   Breztri Aerosphere 160-9-4.8 MCG/ACT Aero Generic drug: Budeson-Glycopyrrol-Formoterol Inhale 2 puffs into the lungs 2 (two) times daily.   budesonide 0.25 MG/2ML nebulizer solution Commonly known as: Pulmicort Inhale 1 ampule by nebulization 2 (two) times daily.   budesonide 32 MCG/ACT nasal spray Commonly  known as: RHINOCORT AQUA  Place 1 spray into both nostrils daily.   budesonide-formoterol 160-4.5 MCG/ACT inhaler Commonly known as: Symbicort Inale 2 puffs by mouth twice a day   buprenorphine 10 MCG/HR Ptwk Commonly known as: Butrans Place 1 patch onto the skin once a week.   CALCIUM PO Take 1 tablet by mouth 2 (two) times daily.   Cetirizine HCl 10 MG Caps Take 10 mg by mouth 2 (two) times daily.   chlorpheniramine-HYDROcodone 10-8 MG/5ML Suer Commonly known as: TUSSIONEX Take 2.5 - 39ms by mouth at bedtime as needed for cough.   chlorpheniramine-HYDROcodone 10-8 MG/5ML Suer Commonly known as: TUSSIONEX Take 2.5 - 5 mLs by mouth at bedtime as needed for cough.   dextromethorphan-guaiFENesin 30-600 MG 12hr tablet Commonly known as: MUCINEX DM Take 1 tablet by mouth 2 (two) times daily.   diphenhydrAMINE 25 MG tablet Commonly known as: BENADRYL Take 50 mg by mouth at bedtime as needed for allergies or sleep.   escitalopram 10 MG tablet Commonly known as: Lexapro Take 1 tablet by mouth once daily   escitalopram 20 MG tablet Commonly known as: Lexapro Take 1 tablet (20 mg total) by mouth daily.   famotidine 40 MG tablet Commonly known as: Pepcid Take 1 tablet (40 mg total) by mouth 2 (two) times daily.   gabapentin 300 MG capsule Commonly known as: NEURONTIN Take 1 capsule (300 mg total) by mouth daily.   HYDROcodone bit-homatropine 5-1.5 MG/5ML syrup Commonly known as: Hydromet Take 5 mL by mouth every 6 hours as needed for 7 days   HYDROcodone-acetaminophen 5-325 MG tablet Commonly known as: NORCO/VICODIN Take 1 tablet by mouth up to 3 (three) times daily as needed.   loratadine 10 MG tablet Commonly known as: Claritin Take 1 tablet by mouth 2 times daily as needed for allergies.   multivitamin-prenatal 27-0.8 MG Tabs tablet Take 1 tablet by mouth 2 (two) times daily.   nadolol 20 MG tablet Commonly known as: CORGARD Take 1 tablet (20 mg total) by  mouth 2 (two) times daily.   Olopatadine HCl 0.2 % Soln Place 1 drop into both eyes daily as needed   ondansetron 8 MG disintegrating tablet Commonly known as: ZOFRAN-ODT DISSOLVE 1 TABLET BY MOUTH EVERY 8 HOURS AS NEEDED FOR NAUSEA OR VOMITING What changed:  how much to take how to take this when to take this reasons to take this   pantoprazole 40 MG tablet Commonly known as: PROTONIX Take 1 tablet (40 mg total) by mouth 2 (two) times daily before a meal.   ProAir Digihaler 108 (90 Base) MCG/ACT Aepb Generic drug: Albuterol Sulfate (sensor) Inhale 2 puffs into the lungs every 4 hours as needed (For coughing and wheezing).   promethazine 25 MG tablet Commonly known as: PHENERGAN Take 1 tablet by mouth every 6 hours as needed for vomiting or nausea.   scopolamine 1 MG/3DAYS Commonly known as: TRANSDERM-SCOP Place 1 patch (1.5 mg total) onto the skin every 3 (three) days.   Sodium Fluoride 1.1 % Pste Brush at bedtime (Do not rinse)   sucralfate 1 GM/10ML suspension Commonly known as: CARAFATE Take 10 mLs by mouth 4 times daily -  with meals and at bedtime.   temazepam 7.5 MG capsule Commonly known as: Restoril Take 1 capsule (7.5 mg total) by mouth at bedtime as needed.   ursodiol 300 MG capsule Commonly known as: ACTIGALL Take 1 capsule (300 mg total) by mouth 2 (two) times daily   verapamil 180 MG CR tablet Commonly known as: CALAN-SR  Take 1 tablet (180 mg total) by mouth at bedtime.   vitamin C 1000 MG tablet Take 2,000 mg by mouth 2 (two) times daily.   VITAMIN D PO Take 1 capsule by mouth 2 (two) times daily.   VITAMIN E PO Take 1 capsule by mouth daily.           Review of Systems  Constitutional:  Negative for weight loss.  HENT:         Occasionally may have throbbing headaches when she has palpitations  Cardiovascular:  Positive for palpitations.       Has intermittent palpitations, longstanding  Gastrointestinal:  Positive for nausea.        Chronic, worse since gastric sleeve procedure  Endocrine: Positive for light-headedness. Negative for cold intolerance.  Skin:        Her face tends to stay flushed and sometimes worse  Neurological:  Negative for weakness.  Psychiatric/Behavioral:  Positive for insomnia.   She has cirrhosis with complications from [facility] and is waiting for liver transplant at [facility]         Examination:    BP 124/70    Pulse 84    Ht 5' 6"  (1.676 m)    Wt (!) 304 lb 12.8 oz (138.3 kg)    LMP 07/08/2021 (Exact Date)    SpO2 98%    BMI 49.20 kg/m     Assessment:  She has postural orthostatic tachycardia syndrome with tendency to low blood pressure readings She likely has autonomic dysfunction causing orthostatic drop in blood pressure along with sinus tachycardia, history of flushing   Very unlikely that she has adrenal insufficiency because of low clinical symptoms such as weight loss, anorexia and no findings of low sodium  Currently her Cortrosyn stimulation test is likely not valid because of having steroid injection in late December along with prednisone Discussed that if she has adrenal insufficiency this would be related to either pituitary or adrenal causes Currently no evidence of hypopituitarism with normal estradiol level done by gynecologist and normal free T4  Blood pressure is relatively low today standing up but this appears to fluctuate  Flushing episodes: These are likely to be from autonomic vasodilatation rather than a systemic condition, no history suggestive of carcinoid or pheochromocytoma  PLAN:   Repeat Cortrosyn stimulation test in 6 weeks but will do it in the morning fasting Also will check ACTH level, free T4    Elayne Snare 07/26/2021, 11:05 AM    Note: This office note was prepared with Estate agent. Any transcriptional errors that result from this process are unintentional.

## 2021-07-29 ENCOUNTER — Other Ambulatory Visit (HOSPITAL_COMMUNITY): Payer: Self-pay

## 2021-07-30 ENCOUNTER — Other Ambulatory Visit (HOSPITAL_COMMUNITY): Payer: Self-pay

## 2021-07-30 MED ORDER — BUPRENORPHINE 15 MCG/HR TD PTWK
1.0000 | MEDICATED_PATCH | TRANSDERMAL | 0 refills | Status: DC
Start: 2021-07-30 — End: 2021-08-15
  Filled 2021-07-30 – 2021-07-31 (×3): qty 4, 28d supply, fill #0

## 2021-07-31 ENCOUNTER — Other Ambulatory Visit (HOSPITAL_COMMUNITY): Payer: Self-pay

## 2021-07-31 MED ORDER — GABAPENTIN 300 MG PO CAPS
300.0000 mg | ORAL_CAPSULE | Freq: Every day | ORAL | 1 refills | Status: DC
Start: 2021-07-31 — End: 2021-08-09
  Filled 2021-07-31: qty 90, 90d supply, fill #0

## 2021-08-01 ENCOUNTER — Other Ambulatory Visit (HOSPITAL_COMMUNITY): Payer: Self-pay

## 2021-08-05 ENCOUNTER — Other Ambulatory Visit (HOSPITAL_COMMUNITY): Payer: Self-pay

## 2021-08-06 ENCOUNTER — Other Ambulatory Visit (HOSPITAL_COMMUNITY): Payer: Self-pay

## 2021-08-07 ENCOUNTER — Other Ambulatory Visit (HOSPITAL_COMMUNITY): Payer: Self-pay

## 2021-08-08 ENCOUNTER — Other Ambulatory Visit (HOSPITAL_COMMUNITY): Payer: Self-pay

## 2021-08-09 ENCOUNTER — Encounter: Payer: Self-pay | Admitting: Internal Medicine

## 2021-08-09 ENCOUNTER — Other Ambulatory Visit (HOSPITAL_COMMUNITY): Payer: Self-pay

## 2021-08-09 ENCOUNTER — Ambulatory Visit: Payer: Managed Care, Other (non HMO) | Admitting: Internal Medicine

## 2021-08-09 ENCOUNTER — Other Ambulatory Visit (INDEPENDENT_AMBULATORY_CARE_PROVIDER_SITE_OTHER): Payer: Managed Care, Other (non HMO)

## 2021-08-09 VITALS — BP 114/60 | HR 88 | Ht 66.0 in | Wt 302.2 lb

## 2021-08-09 DIAGNOSIS — K649 Unspecified hemorrhoids: Secondary | ICD-10-CM

## 2021-08-09 DIAGNOSIS — L299 Pruritus, unspecified: Secondary | ICD-10-CM

## 2021-08-09 DIAGNOSIS — K766 Portal hypertension: Secondary | ICD-10-CM

## 2021-08-09 DIAGNOSIS — D509 Iron deficiency anemia, unspecified: Secondary | ICD-10-CM | POA: Diagnosis not present

## 2021-08-09 DIAGNOSIS — K7460 Unspecified cirrhosis of liver: Secondary | ICD-10-CM | POA: Diagnosis not present

## 2021-08-09 DIAGNOSIS — R110 Nausea: Secondary | ICD-10-CM

## 2021-08-09 DIAGNOSIS — K7581 Nonalcoholic steatohepatitis (NASH): Secondary | ICD-10-CM | POA: Diagnosis not present

## 2021-08-09 LAB — BASIC METABOLIC PANEL
BUN: 12 mg/dL (ref 6–23)
CO2: 28 mEq/L (ref 19–32)
Calcium: 8.9 mg/dL (ref 8.4–10.5)
Chloride: 105 mEq/L (ref 96–112)
Creatinine, Ser: 0.87 mg/dL (ref 0.40–1.20)
GFR: 90.46 mL/min (ref >60.00)
Glucose, Bld: 108 mg/dL — ABNORMAL HIGH (ref 70–99)
Potassium: 4.3 mEq/L (ref 3.5–5.1)
Sodium: 137 mEq/L (ref 135–145)

## 2021-08-09 LAB — IBC + FERRITIN
Ferritin: 10.7 ng/mL (ref 10.0–291.0)
Iron: 51 ug/dL (ref 42–145)
Saturation Ratios: 10.3 % — ABNORMAL LOW (ref 20.0–50.0)
TIBC: 494.2 ug/dL — ABNORMAL HIGH (ref 250.0–450.0)
Transferrin: 353 mg/dL (ref 212.0–360.0)

## 2021-08-09 LAB — CBC WITH DIFFERENTIAL/PLATELET
Basophils Absolute: 0 10*3/uL (ref 0.0–0.1)
Basophils Relative: 1 % (ref 0.0–3.0)
Eosinophils Absolute: 0.3 10*3/uL (ref 0.0–0.7)
Eosinophils Relative: 6.2 % — ABNORMAL HIGH (ref 0.0–5.0)
HCT: 35.5 % — ABNORMAL LOW (ref 36.0–46.0)
Hemoglobin: 11.9 g/dL — ABNORMAL LOW (ref 12.0–15.0)
Lymphocytes Relative: 29.7 % (ref 12.0–46.0)
Lymphs Abs: 1.3 10*3/uL (ref 0.7–4.0)
MCHC: 33.5 g/dL (ref 30.0–36.0)
MCV: 90.8 fl (ref 78.0–100.0)
Monocytes Absolute: 0.4 10*3/uL (ref 0.1–1.0)
Monocytes Relative: 9.8 % (ref 3.0–12.0)
Neutro Abs: 2.4 10*3/uL (ref 1.4–7.7)
Neutrophils Relative %: 53.3 % (ref 43.0–77.0)
Platelets: 113 10*3/uL — ABNORMAL LOW (ref 150.0–400.0)
RBC: 3.91 Mil/uL (ref 3.87–5.11)
RDW: 14.7 % (ref 11.5–15.5)
WBC: 4.5 10*3/uL (ref 4.0–10.5)

## 2021-08-09 LAB — HEPATIC FUNCTION PANEL
ALT: 40 U/L — ABNORMAL HIGH (ref 0–35)
AST: 37 U/L (ref 0–37)
Albumin: 3.8 g/dL (ref 3.5–5.2)
Alkaline Phosphatase: 83 U/L (ref 39–117)
Bilirubin, Direct: 0.1 mg/dL (ref 0.0–0.3)
Total Bilirubin: 0.5 mg/dL (ref 0.2–1.2)
Total Protein: 6.5 g/dL (ref 6.0–8.3)

## 2021-08-09 LAB — PROTIME-INR
INR: 1.1 ratio — ABNORMAL HIGH (ref 0.8–1.0)
Prothrombin Time: 12.3 s (ref 9.6–13.1)

## 2021-08-09 MED ORDER — PROMETHAZINE HCL 25 MG PO TABS
25.0000 mg | ORAL_TABLET | Freq: Four times a day (QID) | ORAL | 1 refills | Status: DC | PRN
  Filled 2021-08-09: qty 120, 30d supply, fill #0
  Filled 2021-08-26 – 2021-09-05 (×3): qty 120, 30d supply, fill #1

## 2021-08-09 MED FILL — Ondansetron Orally Disintegrating Tab 8 MG: ORAL | 20 days supply | Qty: 60 | Fill #0 | Status: CN

## 2021-08-09 NOTE — Patient Instructions (Addendum)
Your provider has requested that you go to the basement level for lab work before leaving today. Press "B" on the elevator. The lab is located at the first door on the left as you exit the elevator.  Discontinue scopolamine.  Please follow up with Dr [name] in 3 months.  If you are age 29 or older, your body mass index should be between 23-30. Your Body mass index is 48.78 kg/m. If this is out of the aforementioned range listed, please consider follow up with your Primary Care Provider.  If you are age 29 or younger, your body mass index should be between 19-25. Your Body mass index is 48.78 kg/m. If this is out of the aformentioned range listed, please consider follow up with your Primary Care Provider.   ________________________________________________________  The [facility] providers would like to encourage you to use [facility] to communicate with providers for non-urgent requests or questions.  Due to long hold times on the telephone, sending your provider a message by [facility] may be a faster and more efficient way to get a response.  Please allow 48 business hours for a response.  Please remember that this is for non-urgent requests.  _______________________________________________________  Due to recent changes in healthcare laws, you may see the results of your imaging and laboratory studies on MyChart before your provider has had a chance to review them.  We understand that in some cases there may be results that are confusing or concerning to you. Not all laboratory results come back in the same time frame and the provider may be waiting for multiple results in order to interpret others.  Please give [place] 48 hours in order for your provider to thoroughly review all the results before contacting the office for clarification of your results.

## 2021-08-09 NOTE — Progress Notes (Signed)
Subjective:    Patient ID: Linda Becker, female    DOB: 1990/08/14, 31 y.o.   MRN: [id number]  HPI Linda Becker is a 31 year old female with NASH cirrhosis with portal hypertension, history of variceal bleed and prior band ligation, thrombocytopenia, obesity status post gastric sleeve in January 2021, GAVE, IDA, history of colon polyps, chronic abdominal pain with nausea, insomnia who is here for follow-up.  She is here today with her best friend.  She was last seen on 05/23/2021.  She reports that she is doing mostly well on the whole.  She has had about a week of significant itching.  This is diffuse, worse on the hands and feet and worse at night.  She has nausea which is stable and she is using Phenergan at least 4 times a day.  Zofran does not help too much but she will occasionally use it.  Scopolamine patch did not help at all.  She has had low energy and ice craving and wonders if her iron is low again.  Sleep is better with the temazepam.  Primary care lowered this to 7.5 mg nightly and it helps.  Reflux is well controlled on twice daily pantoprazole and famotidine.  She was able to tolerate increase nadolol dose from 20 to 40 mg.  Initially she had some dizziness and lower blood pressure but is now tolerating this.  She reports bowel movements is usually formed and usually twice a day though she has had some hemorrhoidal bleeding of late.  She established with pain management and sees a PA there named Legrand Como.  She was initially tried on hydrocodone which was minimally helpful and she has been changed to [employer].  This seems to be helping her abdominal pain.  She received her last dose of IV iron on 05/20/2021  Review of Systems As per HPI, otherwise negative  Current Medications, Allergies, Past Medical History, Past Surgical History, Family History and Social History were reviewed in [employer] record.    Objective:   Physical Exam BP 114/60    Pulse  88    Ht 5' 6"  (1.676 m)    Wt (!) 302 lb 3.2 oz (137.1 kg)    BMI 48.78 kg/m  Gen: awake, alert, NAD HEENT: anicteric CV: RRR, no mrg Pulm: CTA b/l Abd: soft, NT/ND, +BS throughout Ext: no c/c/e Neuro: nonfocal, no asterixis  CBC Latest Ref Rng & Units 08/09/2021 06/20/2021 05/23/2021  WBC 4.0 - 10.5 K/uL 4.5 6.9 5.0  Hemoglobin 12.0 - 15.0 g/dL 11.9(L) 11.4(L) 10.5(L)  Hematocrit 36.0 - 46.0 % 35.5(L) 35.5(L) 33.5(L)  Platelets 150.0 - 400.0 K/uL 113.0(L) 122.0(L) 82.0(L)   Iron/TIBC/Ferritin/ %Sat    Component Value Date/Time   IRON 51 08/09/2021 1004   TIBC 494.2 (H) 08/09/2021 1004   FERRITIN 10.7 08/09/2021 1004   IRONPCTSAT 10.3 (L) 08/09/2021 1004   Lab Results  Component Value Date   INR 1.1 (H) 08/09/2021   INR 1.1 (H) 06/20/2021   INR 1.1 (H) 05/23/2021   CMP     Component Value Date/Time   NA 137 08/09/2021 1004   NA 145 01/14/2020 1309   K 4.3 08/09/2021 1004   CL 105 08/09/2021 1004   CO2 28 08/09/2021 1004   GLUCOSE 108 (H) 08/09/2021 1004   BUN 12 08/09/2021 1004   BUN 12 01/14/2020 1309   CREATININE 0.87 08/09/2021 1004   CREATININE 0.84 02/07/2020 1412   CALCIUM 8.9 08/09/2021 1004   PROT 6.5 08/09/2021 1004  PROT 6.2 (L) 01/14/2020 1309   ALBUMIN 3.8 08/09/2021 1004   ALBUMIN 3.9 01/14/2020 1309   AST 37 08/09/2021 1004   AST 31 02/07/2020 1412   ALT 40 (H) 08/09/2021 1004   ALT 47 (H) 02/07/2020 1412   ALKPHOS 83 08/09/2021 1004   BILITOT 0.5 08/09/2021 1004   BILITOT 0.3 02/07/2020 1412   GFRNONAA >60 05/06/2021 1016   GFRNONAA >60 02/07/2020 1412   GFRAA >60 02/07/2020 1412        Assessment & Plan:  31 year old female with NASH cirrhosis with portal hypertension, history of variceal bleed and prior band ligation, thrombocytopenia, obesity status post gastric sleeve in January 2021, GAVE, IDA, history of colon polyps, chronic abdominal pain with nausea, insomnia who is here for follow-up.  NASH cirrhosis --also seeing Dr. Loletha Grayer with  [place] transplant hepatology.  Her liver disease on the whole has remained better compensated than it was last year.  Pruritus seems to be new, see #2. --Esophageal varices: History of prior EVL, small and not amenable to repeat banding at last EGD.  Nadolol increased to 40 mg with improvement in heart rate.  Heart rate still slightly above goal of resting 60 bpm.  Consider surveillance endoscopy in November 2023 --No issues with ascites or lower extremity edema --History of mild encephalopathy; none now; continue rifaximin 550 mg twice daily --HCC screening CT scan done in September 2022 negative; repeat with cross-sectional imaging given obesity and lower sensitivity of ultrasound around September 2023 --[place] [facility]ology follow-up --Lab work today  2.  Pruritus --check liver enzymes today.  May consider ursodiol or bile acid sequestrant  3.  GERD/chronic nausea --unclear etiology.  Seems to be managing with promethazine 25 mg every 6 hours as needed.  Zofran 4 mg every 8 hours as needed for refractory nausea.  Continue pantoprazole 40 mg twice daily AC and famotidine 20 mg twice daily  4.  Chronic abdominal pain --unclear etiology after thorough endoscopic and cross-sectional imaging.  She is seeing pain management.  5.  Insomnia --refractory to Benadryl, trazodone and hydroxyzine ineffective --Continue temazepam 7.5 mg nightly as needed  6.  IDA with hx of GAVE --previous IV iron, she is having some ice craving and fatigue, repeat iron studies today  7.  History of SSP of the colon --surveillance colonoscopy March 2025  8.  Hemorrhoids with intermittent bleeding --Preparation H suppository per box instruction  74-monthfollow-up, sooner if needed

## 2021-08-12 ENCOUNTER — Other Ambulatory Visit: Payer: Self-pay | Admitting: Internal Medicine

## 2021-08-12 ENCOUNTER — Other Ambulatory Visit (HOSPITAL_COMMUNITY): Payer: Self-pay

## 2021-08-13 ENCOUNTER — Telehealth: Payer: Self-pay | Admitting: Pharmacy Technician

## 2021-08-13 ENCOUNTER — Telehealth: Payer: Self-pay | Admitting: *Deleted

## 2021-08-13 ENCOUNTER — Other Ambulatory Visit (HOSPITAL_COMMUNITY): Payer: Self-pay

## 2021-08-13 ENCOUNTER — Encounter: Payer: Self-pay | Admitting: Allergy and Immunology

## 2021-08-13 ENCOUNTER — Other Ambulatory Visit: Payer: Self-pay

## 2021-08-13 ENCOUNTER — Ambulatory Visit: Payer: Managed Care, Other (non HMO) | Admitting: Allergy and Immunology

## 2021-08-13 VITALS — BP 108/72 | HR 97 | Temp 97.6°F | Resp 18

## 2021-08-13 DIAGNOSIS — J3089 Other allergic rhinitis: Secondary | ICD-10-CM | POA: Diagnosis not present

## 2021-08-13 DIAGNOSIS — K219 Gastro-esophageal reflux disease without esophagitis: Secondary | ICD-10-CM | POA: Diagnosis not present

## 2021-08-13 DIAGNOSIS — K7460 Unspecified cirrhosis of liver: Secondary | ICD-10-CM

## 2021-08-13 DIAGNOSIS — L299 Pruritus, unspecified: Secondary | ICD-10-CM | POA: Diagnosis not present

## 2021-08-13 DIAGNOSIS — J4550 Severe persistent asthma, uncomplicated: Secondary | ICD-10-CM | POA: Diagnosis not present

## 2021-08-13 DIAGNOSIS — J4551 Severe persistent asthma with (acute) exacerbation: Secondary | ICD-10-CM

## 2021-08-13 MED ORDER — ALBUTEROL SULFATE HFA 108 (90 BASE) MCG/ACT IN AERS
2.0000 | INHALATION_SPRAY | Freq: Four times a day (QID) | RESPIRATORY_TRACT | 1 refills | Status: DC | PRN
  Filled 2021-08-13: qty 8.5, 25d supply, fill #0
  Filled 2021-09-25: qty 8.5, 25d supply, fill #1

## 2021-08-13 MED ORDER — BREZTRI AEROSPHERE 160-9-4.8 MCG/ACT IN AERO
2.0000 | INHALATION_SPRAY | Freq: Two times a day (BID) | RESPIRATORY_TRACT | 2 refills | Status: DC
  Filled 2021-08-13 – 2022-01-18 (×6): qty 10.7, 30d supply, fill #0

## 2021-08-13 MED ORDER — DUPILUMAB 300 MG/2ML ~~LOC~~ SOSY
600.0000 mg | PREFILLED_SYRINGE | Freq: Once | SUBCUTANEOUS | Status: AC
  Administered 2021-08-13: 600 mg via SUBCUTANEOUS

## 2021-08-13 MED ORDER — ALBUTEROL SULFATE (2.5 MG/3ML) 0.083% IN NEBU
2.5000 mg | INHALATION_SOLUTION | RESPIRATORY_TRACT | 1 refills | Status: DC | PRN
Start: 2021-08-13 — End: 2023-06-03
  Filled 2021-08-13: qty 75, 5d supply, fill #0
  Filled 2022-02-04: qty 90, 6d supply, fill #0
  Filled 2022-05-15: qty 90, 5d supply, fill #0

## 2021-08-13 MED ORDER — FAMOTIDINE 40 MG PO TABS
40.0000 mg | ORAL_TABLET | Freq: Two times a day (BID) | ORAL | 2 refills | Status: DC
  Filled 2021-08-13: qty 60, 30d supply, fill #0
  Filled 2021-09-02: qty 180, 90d supply, fill #0
  Filled 2021-09-25 – 2021-10-22 (×4): qty 60, 30d supply, fill #0
  Filled 2021-12-24: qty 2, 1d supply, fill #0
  Filled 2021-12-24: qty 58, 29d supply, fill #0
  Filled 2022-01-17 – 2022-07-29 (×8): qty 60, 30d supply, fill #0

## 2021-08-13 MED ORDER — BUDESONIDE 32 MCG/ACT NA SUSP
1.0000 | Freq: Two times a day (BID) | NASAL | 2 refills | Status: DC
  Filled 2021-08-13 – 2022-05-15 (×2): qty 8.43, 30d supply, fill #0

## 2021-08-13 NOTE — Patient Instructions (Addendum)
°  1.  Treat and prevent inflammation:  A. Breztri - 2 inhalations 2 times per day B. OTC Budesonide - 1 spray each nostril 2 time per day C. Dupilumab injection TODAY and every 2 weeks  2.  Treat and prevent reflux/LPR:   A. Consolidate all caffeine consumption   B. Famotidine 40 mg - 1 tablet twice a day C. Pantoprazole 40 mg - 1 tablet 2 times per day  3.  If needed:  A. Albuterol - 2 inhalations or nebulization every 4-6 hours  B. Antihistamine -cetirizine 10 mg - 1-2 tabs 1-2 times per day (MAX=26m/day) C. Mucinex DM - 2 tablets 2 times per day  4. For this recent event:  A. Start Dupilumab B. Prednisone 10 mg - 1 tablet 1 time per day for 5 days only C. Budesonide 0.5 mg - nebulize 4 times per day D. Lots of nasal saline  5. Return to clinic in 12 weeks or earlier if problem

## 2021-08-13 NOTE — Telephone Encounter (Signed)
Dr. [name], She did receive Feraheme under [facility]. She now has [place] and Venofer is the preferred med. Maudie Mercury

## 2021-08-13 NOTE — Telephone Encounter (Signed)
-----   Message from Herbie Drape, LPN sent at 9/92/3414 10:26 AM EST ----- Patient received a loading dose of Dupixent for her Asthma. Per Dr. Neldon Mc 300 mg every 2 weeks.

## 2021-08-13 NOTE — Telephone Encounter (Signed)
Dr. Aundra Millet Submission: denied - feraheme Payer: [employer] Medication & CPT/J Code(s) submitted: Feraheme (ferumoxytol) [phone] Route of submission (phone, fax, portal): phone Auth type: Buy/Bill Units/visits requested: 1  Denied due to patient has not tried and or failed step therapy. Venofer Infed Ferrlecit  Would you like to try venofer??? Please advise

## 2021-08-13 NOTE — Telephone Encounter (Signed)
L/M for patient to contact me to get current Ins card from patient in order to get approval and submit again for Dupixent

## 2021-08-13 NOTE — Progress Notes (Signed)
[facility] [place] [facility] [place]   Follow-up Note  Referring Provider: Deon Pilling, NP Primary Provider: Deon Pilling, NP Date of Office Visit: 08/13/2021  Subjective:   Linda Becker (DOB: 05/26/1991) is a 31 y.o. female who returns to the [facility] on 08/13/2021 in re-evaluation of the following:  HPI: Linda Becker returns to this clinic in evaluation of asthma, allergic rhinitis, and LPR.  Her last visit to this clinic was 11 June 2021.  She was doing okay with her asthma and her allergic rhinitis and her LPR until 1 week ago at which point in time she developed rhinitis with nasal congestion and sneezing and then drop down into her chest with wheezing and coughing and she has some shortness of breath.  Her husband is at home with a "cold".  She restarted her budesonide and albuterol nebulization in addition to her other anti-inflammatory medications.  Unfortunately, because of the logistical issue involving her insurance she was not using her dupilumab injections.  She has noticed that she has developed pruritus over the course of the past several weeks.  She believes that this is tied up with her hepatic inflammation and cirrhosis.  She tried Claritin which did not really help this issue.  Her reflux has been under okay control at this point.  Allergies as of 08/13/2021       Reactions   Progesterone Rash   Was in a form of birth control.   Penicillins Rash   Did it involve swelling of the face/tongue/throat, SOB, or low BP? No Did it involve sudden or severe rash/hives, skin peeling, or any reaction on the inside of your mouth or nose? Yes Did you need to seek medical attention at a hospital or doctor's office? No When did it last happen?      9 + months If all above answers are NO, may proceed with cephalosporin use.        Medication List    albuterol (2.5 MG/3ML) 0.083% nebulizer solution Commonly known as:  PROVENTIL Inhale 1 ampule by nebulization every 4 (four) hours as needed for wheezing or shortness of breath.   albuterol 108 (90 Base) MCG/ACT inhaler Commonly known as: VENTOLIN HFA Inhale 2 puffs into the lungs every 6 (six) hours as needed for wheezing or shortness of breath.   ARIPiprazole 5 MG tablet Commonly known as: ABILIFY Take 1 tablet (5 mg total) by mouth daily.   Breztri Aerosphere 160-9-4.8 MCG/ACT Aero Generic drug: Budeson-Glycopyrrol-Formoterol Inhale 2 puffs into the lungs 2 (two) times daily.   budesonide 32 MCG/ACT nasal spray Commonly known as: RHINOCORT AQUA Place 1 spray into both nostrils in the morning and at bedtime.   buprenorphine 15 MCG/HR Commonly known as: Butrans Place 1 patch onto the skin once a week.   CALCIUM PO Take 1 tablet by mouth 2 (two) times daily.   Cetirizine HCl 10 MG Caps Take 10 mg by mouth 2 (two) times daily.   dextromethorphan-guaiFENesin 30-600 MG 12hr tablet Commonly known as: MUCINEX DM Take 1 tablet by mouth 2 (two) times daily.   diphenhydrAMINE 25 MG tablet Commonly known as: BENADRYL Take 50 mg by mouth at bedtime as needed for allergies or sleep.   escitalopram 20 MG tablet Commonly known as: Lexapro Take 1 tablet (20 mg total) by mouth daily.   famotidine 40 MG tablet Commonly known as: Pepcid Take 1 tablet (40 mg total) by mouth 2 (two) times daily.  gabapentin 300 MG capsule Commonly known as: NEURONTIN Take 1 capsule (300 mg total) by mouth daily.   HYDROcodone-acetaminophen 5-325 MG tablet Commonly known as: NORCO/VICODIN Take 1 tablet by mouth up to 3 (three) times daily as needed.   multivitamin-prenatal 27-0.8 MG Tabs tablet Take 1 tablet by mouth 2 (two) times daily.   nadolol 20 MG tablet Commonly known as: CORGARD Take 1 tablet (20 mg total) by mouth 2 (two) times daily.   Olopatadine HCl 0.2 % Soln Place 1 drop into both eyes daily as needed   ondansetron 8 MG disintegrating  tablet Commonly known as: ZOFRAN-ODT DISSOLVE 1 TABLET BY MOUTH EVERY 8 HOURS AS NEEDED FOR NAUSEA OR VOMITING   pantoprazole 40 MG tablet Commonly known as: PROTONIX Take 1 tablet (40 mg total) by mouth 2 (two) times daily before a meal.   ProAir Digihaler 108 (90 Base) MCG/ACT Aepb Generic drug: Albuterol Sulfate (sensor) Inhale 2 puffs into the lungs every 4 hours as needed (For coughing and wheezing).   promethazine 25 MG tablet Commonly known as: PHENERGAN Take 1 tablet by mouth every 6 hours as needed for vomiting or nausea.   sucralfate 1 GM/10ML suspension Commonly known as: CARAFATE Take 10 mLs by mouth 4 times daily -  with meals and at bedtime.   temazepam 7.5 MG capsule Commonly known as: Restoril Take 1 capsule (7.5 mg total) by mouth at bedtime as needed.   verapamil 180 MG CR tablet Commonly known as: CALAN-SR Take 1 tablet (180 mg total) by mouth at bedtime.   vitamin C 1000 MG tablet Take 2,000 mg by mouth 2 (two) times daily.   VITAMIN D PO Take 1 capsule by mouth 2 (two) times daily.   VITAMIN E PO Take 1 capsule by mouth daily.    Past Medical History:  Diagnosis Date   Allergic rhinitis    remote hx of allergy shots   Asthma    since childhood  on controller meds extrinsic dr Carmelina Peal   Bipolar II disorder (HCC)    Cirrhosis ([place])    Esophageal varices ([place])    Family history of adverse reaction to anesthesia    mom and sister difficult to awaken per patient    Gallbladder sludge    Gastropathy    GERD (gastroesophageal reflux disease)    on nexium  for  long term sx since childhood   H/O miscarriage, not currently pregnant    [redacted] weeks  march 2016   Hepatic steatosis    Hiatal hernia    Hypothyroidism    Migraine    Murmur    pt reports MVP   Portal hypertension (HCC)    Sessile colonic polyp    Splenomegaly    Steatohepatitis    Syncope    under eval ? cause   Tachycardia    episodes with near syncope eval dr Nadyne Coombes  on no meds  dced LABA   Thrombocytopenia ([place])    Upper GI bleed     Past Surgical History:  Procedure Laterality Date   BIOPSY  09/03/2018   Procedure: BIOPSY;  Surgeon: Jerene Bears, MD;  Location: [facility];  Service: Gastroenterology;;   BIOPSY  09/26/2020   Procedure: BIOPSY;  Surgeon: Jerene Bears, MD;  Location: [facility];  Service: Gastroenterology;;   broken right femur  2008   rod placed   COLONOSCOPY WITH PROPOFOL N/A 09/03/2018   Procedure: COLONOSCOPY WITH PROPOFOL;  Surgeon: Jerene Bears, MD;  Location: [facility];  Service: Gastroenterology;  Laterality: N/A;   DILATION AND CURETTAGE OF UTERUS     ESOPHAGOGASTRODUODENOSCOPY (EGD) WITH PROPOFOL N/A 09/03/2018   Procedure: ESOPHAGOGASTRODUODENOSCOPY (EGD) WITH PROPOFOL;  Surgeon: Jerene Bears, MD;  Location: [facility];  Service: Gastroenterology;  Laterality: N/A;   ESOPHAGOGASTRODUODENOSCOPY (EGD) WITH PROPOFOL N/A 01/27/2020   Procedure: ESOPHAGOGASTRODUODENOSCOPY (EGD) WITH PROPOFOL;  Surgeon: Rush Landmark Telford Nab., MD;  Location: [facility];  Service: Gastroenterology;  Laterality: N/A;   ESOPHAGOGASTRODUODENOSCOPY (EGD) WITH PROPOFOL N/A 03/12/2020   Procedure: ESOPHAGOGASTRODUODENOSCOPY (EGD) WITH PROPOFOL;  Surgeon: Jerene Bears, MD;  Location: [facility];  Service: Gastroenterology;  Laterality: N/A;   ESOPHAGOGASTRODUODENOSCOPY (EGD) WITH PROPOFOL N/A 09/26/2020   Procedure: ESOPHAGOGASTRODUODENOSCOPY (EGD) WITH PROPOFOL;  Surgeon: Jerene Bears, MD;  [facility]: [facility];  Service: Gastroenterology;  Laterality: N/A;   ESOPHAGOGASTRODUODENOSCOPY (EGD) WITH PROPOFOL N/A 04/29/2021   Procedure: ESOPHAGOGASTRODUODENOSCOPY (EGD) WITH PROPOFOL;  Surgeon: Jerene Bears, MD;  Location: [facility];  Service: Gastroenterology;  Laterality: N/A;   GASTRIC VARICES BANDING  01/27/2020   Procedure: GASTRIC VARICES BANDING;  Surgeon: Rush Landmark Telford Nab., MD;  Location: [facility];  Service: Gastroenterology;;   HOT  HEMOSTASIS N/A 04/29/2021   Procedure: HOT HEMOSTASIS (ARGON PLASMA COAGULATION/BICAP);  Surgeon: Jerene Bears, MD;  Location: [facility];  Service: Gastroenterology;  Laterality: N/A;   IR TRANSCATHETER BX  01/19/2019   IR US GUIDE VASC ACCESS RIGHT  01/19/2019   IR VENOGRAM HEPATIC W HEMODYNAMIC EVALUATION  01/19/2019   LAPAROSCOPIC GASTRIC SLEEVE RESECTION N/A 06/28/2019   Procedure: LAPAROSCOPIC GASTRIC SLEEVE RESECTION, Upper Endo, ERAS Pathway;  Surgeon: Clovis Riley, MD;  Location: [facility];  Service: General;  Laterality: N/A;   OB ultrasound N/A 12/01/2017   see report   POLYPECTOMY  09/03/2018   Procedure: POLYPECTOMY;  Surgeon: Jerene Bears, MD;  Location: [facility];  Service: Gastroenterology;;   TONSILLECTOMY  2006   WISDOM TOOTH EXTRACTION      Review of systems negative except as noted in HPI / PMHx or noted below:  Review of Systems  Constitutional: Negative.   HENT: Negative.    Eyes: Negative.   Respiratory: Negative.    Cardiovascular: Negative.   Gastrointestinal: Negative.   Genitourinary: Negative.   Musculoskeletal: Negative.   Skin: Negative.   Neurological: Negative.   Endo/Heme/Allergies: Negative.   Psychiatric/Behavioral: Negative.      Objective:   Vitals:   08/13/21 0931  BP: 108/72  Pulse: 97  Resp: 18  Temp: 97.6 F (36.4 C)  SpO2: 98%          Physical Exam Constitutional:      Appearance: She is not diaphoretic.  HENT:     Head: Normocephalic.     Right Ear: Tympanic membrane, ear canal and external ear normal.     Left Ear: Tympanic membrane, ear canal and external ear normal.     Nose: Nose normal. No mucosal edema or rhinorrhea.     Mouth/Throat:     Pharynx: Uvula midline. No oropharyngeal exudate.  Eyes:     Conjunctiva/sclera: Conjunctivae normal.  Neck:     Thyroid: No thyromegaly.     Trachea: Trachea normal. No tracheal tenderness or tracheal deviation.  Cardiovascular:     Rate and Rhythm: Normal rate and  regular rhythm.     Heart sounds: Normal heart sounds, S1 normal and S2 normal. No murmur heard. Pulmonary:     Effort: No respiratory distress.     Breath sounds: Normal breath  sounds. No stridor. No wheezing or rales.  Lymphadenopathy:     Head:     Right side of head: No tonsillar adenopathy.     Left side of head: No tonsillar adenopathy.     Cervical: No cervical adenopathy.  Skin:    Findings: No erythema or rash.     Nails: There is no clubbing.  Neurological:     Mental Status: She is alert.    Diagnostics:   The patient had an Asthma Control Test with the following results: ACT Total Score: 9.    Assessment and Plan:   1. Severe persistent asthma without complication   2. Severe persistent asthma with exacerbation   3. Perennial allergic rhinitis   4. Gastroesophageal reflux disease, unspecified whether esophagitis present   5. Pruritic disorder     1.  Treat and prevent inflammation:  A. Breztri - 2 inhalations 2 times per day B. OTC Budesonide - 1 spray each nostril 2 time per day C. Dupilumab injection TODAY and every 2 weeks  2.  Treat and prevent reflux/LPR:   A. Consolidate all caffeine consumption   B. Famotidine 40 mg - 1 tablet twice a day C. Pantoprazole 40 mg - 1 tablet 2 times per day  3.  If needed:  A. Albuterol - 2 inhalations or nebulization every 4-6 hours  B. Antihistamine -cetirizine 10 mg - 1-2 tabs 1-2 times per day (MAX=63m/day) C. Mucinex DM - 2 tablets 2 times per day  4. For this recent event:  A. Start Dupilumab B. Prednisone 10 mg - 1 tablet 1 time per day for 5 days only C. Budesonide 0.5 mg - nebulize 4 times per day D. Lots of nasal saline  5. Return to clinic in 12 weeks or earlier if problem   [place] infection and unfortunately this occurred in the setting of her not using her dupilumab injections to minimize her respiratory tract inflammation.  She will continue on a collection of  anti-inflammatory agents and will restart her dupilumab today.  I have given her a very short course of systemic steroids and she can continue on her nebulized budesonide.  Assuming she does well with this plan I will see her back in this clinic in 12 weeks or earlier if there is a problem.  EAllena Katz MD [facility]

## 2021-08-14 ENCOUNTER — Other Ambulatory Visit (HOSPITAL_COMMUNITY): Payer: Self-pay

## 2021-08-14 ENCOUNTER — Other Ambulatory Visit: Payer: Self-pay | Admitting: Pharmacy Technician

## 2021-08-14 ENCOUNTER — Encounter: Payer: Self-pay | Admitting: Internal Medicine

## 2021-08-14 ENCOUNTER — Encounter: Payer: Self-pay | Admitting: Allergy and Immunology

## 2021-08-15 ENCOUNTER — Other Ambulatory Visit (HOSPITAL_COMMUNITY): Payer: Self-pay

## 2021-08-15 MED ORDER — HYDROCODONE-ACETAMINOPHEN 5-325 MG PO TABS
1.0000 | ORAL_TABLET | Freq: Three times a day (TID) | ORAL | 0 refills | Status: DC
  Filled 2021-08-15: qty 90, 30d supply, fill #0

## 2021-08-15 MED ORDER — BUPRENORPHINE 15 MCG/HR TD PTWK
1.0000 | MEDICATED_PATCH | TRANSDERMAL | 0 refills | Status: DC
  Filled 2021-08-26 – 2021-09-05 (×3): qty 4, 28d supply, fill #0

## 2021-08-15 NOTE — Telephone Encounter (Signed)
Received fax from [email or web address] requesting PA for [facility].  I called [place] and asked them to check on the prescription since we did get an approval from [facility].  Pharmacy personnel said that they do not run her medications through her Medicaid; it is Medicaid Family planning.  I have submitted new prior authorization request on [email or web address] through [employer].  Awaiting reply.  ?

## 2021-08-16 ENCOUNTER — Other Ambulatory Visit (HOSPITAL_COMMUNITY): Payer: Self-pay

## 2021-08-19 ENCOUNTER — Ambulatory Visit (INDEPENDENT_AMBULATORY_CARE_PROVIDER_SITE_OTHER): Payer: Managed Care, Other (non HMO)

## 2021-08-19 ENCOUNTER — Other Ambulatory Visit: Payer: Self-pay

## 2021-08-19 VITALS — BP 113/71 | HR 88 | Temp 98.5°F | Resp 18 | Ht 66.0 in | Wt 300.4 lb

## 2021-08-19 DIAGNOSIS — D509 Iron deficiency anemia, unspecified: Secondary | ICD-10-CM

## 2021-08-19 DIAGNOSIS — K31819 Angiodysplasia of stomach and duodenum without bleeding: Secondary | ICD-10-CM

## 2021-08-19 MED ORDER — FAMOTIDINE IN NACL 20-0.9 MG/50ML-% IV SOLN: 20.0000 mg | Freq: Once | INTRAVENOUS | Status: DC | PRN

## 2021-08-19 MED ORDER — EPINEPHRINE 0.3 MG/0.3ML IJ SOAJ: 0.3000 mg | Freq: Once | INTRAMUSCULAR | Status: DC | PRN

## 2021-08-19 MED ORDER — DIPHENHYDRAMINE HCL 50 MG/ML IJ SOLN: 50.0000 mg | Freq: Once | INTRAMUSCULAR | Status: DC | PRN

## 2021-08-19 MED ORDER — ALBUTEROL SULFATE HFA 108 (90 BASE) MCG/ACT IN AERS: 2.0000 | INHALATION_SPRAY | Freq: Once | RESPIRATORY_TRACT | Status: DC | PRN

## 2021-08-19 MED ORDER — SODIUM CHLORIDE 0.9 % IV SOLN: Freq: Once | INTRAVENOUS | Status: DC | PRN

## 2021-08-19 MED ORDER — SODIUM CHLORIDE 0.9 % IV SOLN
200.0000 mg | Freq: Once | INTRAVENOUS | Status: AC
  Administered 2021-08-19: 200 mg via INTRAVENOUS
  Filled 2021-08-19: qty 10

## 2021-08-19 MED ORDER — METHYLPREDNISOLONE SODIUM SUCC 125 MG IJ SOLR: 125.0000 mg | Freq: Once | INTRAMUSCULAR | Status: DC | PRN

## 2021-08-19 NOTE — Telephone Encounter (Signed)
[employer] is reviewing your PA request and will respond within 24 hours for Medicaid or up to 72 hours for non-Medicaid plans, based on the required timeframe determined by state or federal regulations. To check for an update later, open this request from your dashboard.

## 2021-08-19 NOTE — Progress Notes (Signed)
Diagnosis: Iron Deficiency Anemia ? ?Provider:  Marshell Garfinkel, MD ? ?Procedure: Infusion ? ?IV Type: Peripheral, IV Location: R Hand ? ?Venofer (Iron Sucrose), Dose: 200 mg ? ?Infusion Start Time: 10.23 08/19/2021 ? ?Infusion Stop Time: 10.40 08/19/2021 ? ?Post Infusion IV Care: Peripheral IV Discontinued ? ?Discharge: Condition: Good, Destination: Home . AVS provided to patient.  ? ?Performed by:  Sanye Ledesma, RN  ?  ?

## 2021-08-20 ENCOUNTER — Encounter: Payer: Self-pay | Admitting: Allergy and Immunology

## 2021-08-21 ENCOUNTER — Ambulatory Visit (INDEPENDENT_AMBULATORY_CARE_PROVIDER_SITE_OTHER): Payer: Managed Care, Other (non HMO)

## 2021-08-21 ENCOUNTER — Other Ambulatory Visit: Payer: Self-pay

## 2021-08-21 VITALS — BP 113/76 | HR 71 | Temp 98.4°F | Resp 18 | Ht 66.0 in | Wt 307.8 lb

## 2021-08-21 DIAGNOSIS — D509 Iron deficiency anemia, unspecified: Secondary | ICD-10-CM

## 2021-08-21 DIAGNOSIS — K31819 Angiodysplasia of stomach and duodenum without bleeding: Secondary | ICD-10-CM

## 2021-08-21 MED ORDER — DIPHENHYDRAMINE HCL 50 MG/ML IJ SOLN: 50.0000 mg | Freq: Once | INTRAMUSCULAR | Status: DC | PRN

## 2021-08-21 MED ORDER — METHYLPREDNISOLONE SODIUM SUCC 125 MG IJ SOLR: 125.0000 mg | Freq: Once | INTRAMUSCULAR | Status: DC | PRN

## 2021-08-21 MED ORDER — ALBUTEROL SULFATE HFA 108 (90 BASE) MCG/ACT IN AERS: 2.0000 | INHALATION_SPRAY | Freq: Once | RESPIRATORY_TRACT | Status: DC | PRN

## 2021-08-21 MED ORDER — FAMOTIDINE IN NACL 20-0.9 MG/50ML-% IV SOLN: 20.0000 mg | Freq: Once | INTRAVENOUS | Status: DC | PRN

## 2021-08-21 MED ORDER — SODIUM CHLORIDE 0.9 % IV SOLN
200.0000 mg | Freq: Once | INTRAVENOUS | Status: AC
  Administered 2021-08-21: 200 mg via INTRAVENOUS
  Filled 2021-08-21: qty 10

## 2021-08-21 MED ORDER — EPINEPHRINE 0.3 MG/0.3ML IJ SOAJ: 0.3000 mg | Freq: Once | INTRAMUSCULAR | Status: DC | PRN

## 2021-08-21 MED ORDER — SODIUM CHLORIDE 0.9 % IV SOLN: Freq: Once | INTRAVENOUS | Status: DC | PRN

## 2021-08-21 NOTE — Telephone Encounter (Signed)
L/m for patient again to contact me ?

## 2021-08-21 NOTE — Progress Notes (Signed)
Diagnosis: Iron Deficiency Anemia ? ?Provider:  Marshell Garfinkel, MD ? ?Procedure: Infusion ? ?IV Type: Peripheral, IV Location: R Hand ? ?Venofer (Iron Sucrose), Dose: 200 mg ? ?Infusion Start Time: 1032 ? ?Infusion Stop Time: 1050 ? ?Post Infusion IV Care: Peripheral IV Discontinued ? ?Discharge: Condition: Good, Destination: Home . AVS provided to patient.  ? ?Performed by:  Cleophus Molt, RN  ?  ?

## 2021-08-23 ENCOUNTER — Ambulatory Visit: Payer: Self-pay

## 2021-08-23 MED ORDER — DIPHENHYDRAMINE HCL 50 MG/ML IJ SOLN: 50.0000 mg | Freq: Once | INTRAMUSCULAR | Status: AC | PRN

## 2021-08-23 MED ORDER — EPINEPHRINE 0.3 MG/0.3ML IJ SOAJ: 0.3000 mg | Freq: Once | INTRAMUSCULAR | Status: AC | PRN

## 2021-08-23 MED ORDER — ALBUTEROL SULFATE HFA 108 (90 BASE) MCG/ACT IN AERS: 2.0000 | INHALATION_SPRAY | Freq: Once | RESPIRATORY_TRACT | Status: AC | PRN

## 2021-08-23 MED ORDER — FAMOTIDINE IN NACL 20-0.9 MG/50ML-% IV SOLN: 20.0000 mg | Freq: Once | INTRAVENOUS | Status: AC | PRN

## 2021-08-23 MED ORDER — SODIUM CHLORIDE 0.9 % IV SOLN
200.0000 mg | Freq: Once | INTRAVENOUS | Status: AC
  Filled 2021-08-23: qty 10

## 2021-08-23 MED ORDER — METHYLPREDNISOLONE SODIUM SUCC 125 MG IJ SOLR: 125.0000 mg | Freq: Once | INTRAMUSCULAR | Status: AC | PRN

## 2021-08-23 MED ORDER — SODIUM CHLORIDE 0.9 % IV SOLN: Freq: Once | INTRAVENOUS | Status: AC | PRN

## 2021-08-26 ENCOUNTER — Ambulatory Visit (INDEPENDENT_AMBULATORY_CARE_PROVIDER_SITE_OTHER): Payer: Managed Care, Other (non HMO) | Admitting: *Deleted

## 2021-08-26 ENCOUNTER — Other Ambulatory Visit: Payer: Self-pay

## 2021-08-26 ENCOUNTER — Other Ambulatory Visit (HOSPITAL_COMMUNITY): Payer: Self-pay

## 2021-08-26 VITALS — BP 107/77 | HR 87 | Temp 98.9°F | Resp 16 | Ht 66.0 in | Wt 294.6 lb

## 2021-08-26 DIAGNOSIS — D509 Iron deficiency anemia, unspecified: Secondary | ICD-10-CM | POA: Diagnosis not present

## 2021-08-26 DIAGNOSIS — K31819 Angiodysplasia of stomach and duodenum without bleeding: Secondary | ICD-10-CM | POA: Diagnosis not present

## 2021-08-26 MED ORDER — SODIUM CHLORIDE 0.9 % IV SOLN
200.0000 mg | Freq: Once | INTRAVENOUS | Status: AC
  Administered 2021-08-26: 200 mg via INTRAVENOUS
  Filled 2021-08-26: qty 10

## 2021-08-26 NOTE — Progress Notes (Signed)
Diagnosis: Iron Deficiency Anemia ? ?Provider:  Marshell Garfinkel, MD ? ?Procedure: Infusion ? ?IV Type: Peripheral, IV Location: R Antecubital ? ?Venofer (Iron Sucrose), Dose: 200 mg ? ?Infusion Start Time: 2072 am ? ?Infusion Stop Time: 1159 am ? ?Post Infusion IV Care: Observation period completed and Peripheral IV Discontinued ? ?Discharge: Condition: Good, Destination: Home . AVS provided to patient.  ? ?Performed by:  Oren Beckmann, RN  ?  ?

## 2021-08-26 NOTE — Telephone Encounter (Signed)
[employer] and prior authorization is still under review.  The gentleman that I talked with was able to request an expedited review and we should hopefully receive a decision via fax shortly.  ?

## 2021-08-26 NOTE — Telephone Encounter (Signed)
The patient is on the schedule for a dupixent injection on 08/27/21 at 11:00am. I called the patient and left a message for her to call the [place] office back so that we can reschedule her injection.   ?

## 2021-08-27 ENCOUNTER — Ambulatory Visit: Payer: Managed Care, Other (non HMO)

## 2021-08-27 ENCOUNTER — Other Ambulatory Visit (HOSPITAL_COMMUNITY): Payer: Self-pay

## 2021-08-28 ENCOUNTER — Other Ambulatory Visit (HOSPITAL_COMMUNITY): Payer: Self-pay

## 2021-08-28 ENCOUNTER — Ambulatory Visit (INDEPENDENT_AMBULATORY_CARE_PROVIDER_SITE_OTHER): Payer: Managed Care, Other (non HMO)

## 2021-08-28 ENCOUNTER — Other Ambulatory Visit: Payer: Self-pay | Admitting: *Deleted

## 2021-08-28 ENCOUNTER — Other Ambulatory Visit: Payer: Self-pay

## 2021-08-28 VITALS — BP 107/70 | HR 95 | Temp 97.9°F | Resp 16 | Ht 66.0 in | Wt 296.6 lb

## 2021-08-28 DIAGNOSIS — D508 Other iron deficiency anemias: Secondary | ICD-10-CM | POA: Diagnosis not present

## 2021-08-28 DIAGNOSIS — D509 Iron deficiency anemia, unspecified: Secondary | ICD-10-CM

## 2021-08-28 DIAGNOSIS — K31819 Angiodysplasia of stomach and duodenum without bleeding: Secondary | ICD-10-CM | POA: Diagnosis not present

## 2021-08-28 MED ORDER — SODIUM CHLORIDE 0.9 % IV SOLN
200.0000 mg | Freq: Once | INTRAVENOUS | Status: AC
  Administered 2021-08-28: 200 mg via INTRAVENOUS
  Filled 2021-08-28: qty 10

## 2021-08-28 MED ORDER — SPIRIVA RESPIMAT 1.25 MCG/ACT IN AERS
2.0000 | INHALATION_SPRAY | Freq: Every day | RESPIRATORY_TRACT | 5 refills | Status: DC
Start: 2021-08-28 — End: 2022-01-23
  Filled 2021-08-28: qty 4, 30d supply, fill #0

## 2021-08-28 MED ORDER — AIRDUO DIGIHALER 232-14 MCG/ACT IN AEPB
1.0000 | INHALATION_SPRAY | Freq: Two times a day (BID) | RESPIRATORY_TRACT | 5 refills | Status: DC
  Filled 2021-08-28: qty 1, 30d supply, fill #0

## 2021-08-28 NOTE — Telephone Encounter (Signed)
Please let [name] know that her insurance company denied her [employer], academic. She can use AirDuo 232 - 1 inhalation 2 times per day + Spiriva Respimat 1.25 - 2 inhalation 1 time per day.  ?

## 2021-08-28 NOTE — Progress Notes (Signed)
Diagnosis: Iron Deficiency Anemia ? ?Provider:  Marshell Garfinkel, MD ? ?Procedure: Infusion ? ?IV Type: Peripheral, IV Location: R Antecubital ? ?Venofer (Iron Sucrose), Dose: 200 mg ? ?Infusion Start Time: 4830 ? ?Infusion Stop Time: 1500 ? ?Post Infusion IV Care: Peripheral IV Discontinued ? ?Discharge: Condition: Good, Destination: Home . AVS provided to patient.  ? ?Performed by:  Koren Shiver, RN  ?  ?

## 2021-08-28 NOTE — Telephone Encounter (Signed)
Received denial for Breztri from [place].  Denial letter has been placed on Dr. Bruna Potter desk with the qualification criteria.  Please advise.  ?

## 2021-08-28 NOTE — Telephone Encounter (Signed)
New prescriptions have been sent in to patients pharmacy with attached coupon for AirDuo. Called and left a voicemail asking for patient to return call to discuss.  ?

## 2021-08-29 ENCOUNTER — Other Ambulatory Visit (HOSPITAL_COMMUNITY): Payer: Self-pay

## 2021-08-29 NOTE — Telephone Encounter (Signed)
I called the patient's cell and home phone and left a message for her to call back in regards to the change with her inhaler.  ?

## 2021-08-30 ENCOUNTER — Other Ambulatory Visit: Payer: Self-pay

## 2021-08-30 ENCOUNTER — Ambulatory Visit (INDEPENDENT_AMBULATORY_CARE_PROVIDER_SITE_OTHER): Payer: Managed Care, Other (non HMO)

## 2021-08-30 VITALS — BP 125/76 | HR 98 | Temp 98.4°F | Resp 18 | Ht 66.0 in | Wt 301.4 lb

## 2021-08-30 DIAGNOSIS — D508 Other iron deficiency anemias: Secondary | ICD-10-CM

## 2021-08-30 DIAGNOSIS — D509 Iron deficiency anemia, unspecified: Secondary | ICD-10-CM

## 2021-08-30 DIAGNOSIS — K31819 Angiodysplasia of stomach and duodenum without bleeding: Secondary | ICD-10-CM

## 2021-08-30 MED ORDER — SODIUM CHLORIDE 0.9 % IV SOLN
200.0000 mg | Freq: Once | INTRAVENOUS | Status: AC
  Administered 2021-08-30: 200 mg via INTRAVENOUS
  Filled 2021-08-30: qty 10

## 2021-08-30 NOTE — Progress Notes (Signed)
Diagnosis: Iron Deficiency Anemia ? ?Provider:  Marshell Garfinkel, MD ? ?Procedure: Infusion ? ?IV Type: Peripheral, IV Location: R Antecubital ? ?Venofer (Iron Sucrose), Dose: 200 mg ? ?Infusion Start Time: 9935 ? ?Infusion Stop Time: 7017 ? ?Post Infusion IV Care: Peripheral IV Discontinued ? ?Discharge: Condition: Good, Destination: Home . AVS provided to patient.  ? ?Performed by:  Cleophus Molt, RN  ?  ?

## 2021-08-30 NOTE — Telephone Encounter (Signed)
Called and left a voicemail asking for patient to return call to inform.  

## 2021-09-02 ENCOUNTER — Other Ambulatory Visit (HOSPITAL_COMMUNITY): Payer: Self-pay

## 2021-09-02 MED ORDER — URSODIOL 500 MG PO TABS
500.0000 mg | ORAL_TABLET | Freq: Three times a day (TID) | ORAL | 11 refills | Status: DC
  Filled 2021-09-02 – 2021-09-05 (×2): qty 90, 30d supply, fill #0
  Filled 2022-05-15: qty 90, 30d supply, fill #1

## 2021-09-02 NOTE — Telephone Encounter (Signed)
Called and left a voicemail asking for patient to return call to inform.  

## 2021-09-03 ENCOUNTER — Other Ambulatory Visit (HOSPITAL_COMMUNITY): Payer: Self-pay

## 2021-09-05 ENCOUNTER — Other Ambulatory Visit (HOSPITAL_COMMUNITY): Payer: Self-pay

## 2021-09-06 ENCOUNTER — Other Ambulatory Visit (HOSPITAL_COMMUNITY): Payer: Self-pay

## 2021-09-06 MED ORDER — HYDROCODONE-ACETAMINOPHEN 5-325 MG PO TABS
1.0000 | ORAL_TABLET | Freq: Three times a day (TID) | ORAL | 0 refills | Status: DC | PRN
  Filled 2021-09-08 – 2021-09-09 (×3): qty 90, 30d supply, fill #0

## 2021-09-08 ENCOUNTER — Encounter: Payer: Self-pay | Admitting: Internal Medicine

## 2021-09-09 ENCOUNTER — Other Ambulatory Visit (HOSPITAL_COMMUNITY): Payer: Self-pay

## 2021-09-09 ENCOUNTER — Other Ambulatory Visit: Payer: Self-pay | Admitting: Internal Medicine

## 2021-09-09 DIAGNOSIS — K7460 Unspecified cirrhosis of liver: Secondary | ICD-10-CM

## 2021-09-09 MED ORDER — GABAPENTIN 300 MG PO CAPS
300.0000 mg | ORAL_CAPSULE | Freq: Two times a day (BID) | ORAL | 3 refills | Status: DC
  Filled 2021-09-09 – 2021-10-04 (×7): qty 180, 90d supply, fill #0
  Filled 2021-10-22: qty 180, 90d supply, fill #1
  Filled 2021-11-07: qty 180, 90d supply, fill #2
  Filled 2021-12-06: qty 180, 90d supply, fill #3

## 2021-09-09 MED ORDER — NADOLOL 20 MG PO TABS
20.0000 mg | ORAL_TABLET | Freq: Two times a day (BID) | ORAL | 0 refills | Status: DC
  Filled 2021-09-09 – 2022-05-15 (×2): qty 180, 90d supply, fill #0

## 2021-09-10 ENCOUNTER — Ambulatory Visit: Payer: Managed Care, Other (non HMO)

## 2021-09-10 ENCOUNTER — Other Ambulatory Visit: Payer: Managed Care, Other (non HMO)

## 2021-09-10 ENCOUNTER — Other Ambulatory Visit (HOSPITAL_COMMUNITY): Payer: Self-pay

## 2021-09-11 ENCOUNTER — Other Ambulatory Visit: Payer: Managed Care, Other (non HMO)

## 2021-09-11 ENCOUNTER — Ambulatory Visit: Payer: Managed Care, Other (non HMO)

## 2021-09-12 ENCOUNTER — Inpatient Hospital Stay: Admission: RE | Admit: 2021-09-12 | Payer: Managed Care, Other (non HMO) | Source: Ambulatory Visit

## 2021-09-13 ENCOUNTER — Ambulatory Visit: Payer: Managed Care, Other (non HMO) | Admitting: Endocrinology

## 2021-09-16 ENCOUNTER — Other Ambulatory Visit (HOSPITAL_COMMUNITY): Payer: Self-pay

## 2021-09-18 ENCOUNTER — Other Ambulatory Visit (HOSPITAL_COMMUNITY): Payer: Self-pay

## 2021-09-18 ENCOUNTER — Other Ambulatory Visit: Payer: Managed Care, Other (non HMO)

## 2021-09-23 ENCOUNTER — Other Ambulatory Visit (HOSPITAL_COMMUNITY): Payer: Self-pay

## 2021-09-25 ENCOUNTER — Other Ambulatory Visit (HOSPITAL_COMMUNITY): Payer: Self-pay

## 2021-09-25 ENCOUNTER — Encounter: Payer: Self-pay | Admitting: Internal Medicine

## 2021-09-25 ENCOUNTER — Other Ambulatory Visit: Payer: Self-pay | Admitting: Internal Medicine

## 2021-09-25 MED ORDER — PROMETHAZINE HCL 25 MG PO TABS
25.0000 mg | ORAL_TABLET | Freq: Four times a day (QID) | ORAL | 1 refills | Status: DC | PRN
  Filled 2021-09-25 – 2021-10-04 (×4): qty 120, 30d supply, fill #0
  Filled 2021-10-22: qty 120, 30d supply, fill #1

## 2021-09-30 ENCOUNTER — Other Ambulatory Visit (HOSPITAL_COMMUNITY): Payer: Self-pay

## 2021-09-30 ENCOUNTER — Encounter: Payer: Self-pay | Admitting: Internal Medicine

## 2021-10-02 ENCOUNTER — Other Ambulatory Visit (HOSPITAL_COMMUNITY): Payer: Self-pay

## 2021-10-02 ENCOUNTER — Encounter: Payer: Self-pay | Admitting: Internal Medicine

## 2021-10-02 ENCOUNTER — Telehealth: Payer: Managed Care, Other (non HMO) | Admitting: Physician Assistant

## 2021-10-02 DIAGNOSIS — B9689 Other specified bacterial agents as the cause of diseases classified elsewhere: Secondary | ICD-10-CM

## 2021-10-02 DIAGNOSIS — J019 Acute sinusitis, unspecified: Secondary | ICD-10-CM

## 2021-10-02 MED ORDER — DOXYCYCLINE HYCLATE 100 MG PO TABS
100.0000 mg | ORAL_TABLET | Freq: Two times a day (BID) | ORAL | 0 refills | Status: DC
  Filled 2021-10-02: qty 20, 10d supply, fill #0

## 2021-10-02 NOTE — Telephone Encounter (Signed)
Leah from Dr Precious Bard office called wanting more information about pt's disability claim what are the patient's limitations and/or restrictions  ?Please call [phone]  9:00 am to 5:00 pm [place] time. ?

## 2021-10-02 NOTE — Progress Notes (Signed)
I have spent 5 minutes in review of e-visit questionnaire, review and updating patient chart, medical decision making and response to patient.   Drayven Marchena Cody Ponce Skillman, PA-C    

## 2021-10-02 NOTE — Progress Notes (Signed)

## 2021-10-03 ENCOUNTER — Encounter: Payer: Self-pay | Admitting: Internal Medicine

## 2021-10-03 ENCOUNTER — Other Ambulatory Visit (HOSPITAL_COMMUNITY): Payer: Self-pay

## 2021-10-03 MED ORDER — CEFDINIR 300 MG PO CAPS
300.0000 mg | ORAL_CAPSULE | Freq: Two times a day (BID) | ORAL | 0 refills | Status: DC
  Filled 2021-10-03: qty 20, 10d supply, fill #0

## 2021-10-03 MED ORDER — HYDROCOD POLI-CHLORPHE POLI ER 10-8 MG/5ML PO SUER
5.0000 mL | Freq: Two times a day (BID) | ORAL | 0 refills | Status: DC
  Filled 2021-10-03 – 2021-10-09 (×3): qty 100, 10d supply, fill #0

## 2021-10-03 MED ORDER — FLUCONAZOLE 150 MG PO TABS
150.0000 mg | ORAL_TABLET | Freq: Every day | ORAL | 0 refills | Status: DC
  Filled 2021-10-03: qty 2, 3d supply, fill #0

## 2021-10-03 NOTE — Telephone Encounter (Signed)
Leah from Dr. Arrie Senate office called and stated that he would like to do a peer to peer with you about patients disability paperwork.  ? ?[phone] ? ? ?

## 2021-10-04 ENCOUNTER — Other Ambulatory Visit (HOSPITAL_COMMUNITY): Payer: Self-pay

## 2021-10-04 ENCOUNTER — Encounter: Payer: Self-pay | Admitting: Internal Medicine

## 2021-10-04 NOTE — Telephone Encounter (Signed)
I tired the # again and did not reach a person.  Again voicemail for extension [phone] ?

## 2021-10-04 NOTE — Telephone Encounter (Signed)
Leah Drom Dr. [place] called to follow up on scheduling peer to peer. Can be reached at [phone] ?

## 2021-10-04 NOTE — Telephone Encounter (Signed)
I contacted this number on Wednesday of this week and reached voicemail.  I did not leave a message ?I contacted this number again today just now and left a voicemail ? ?

## 2021-10-08 NOTE — Telephone Encounter (Signed)
I have tried the phone number again today and reached voicemail for extension [phone].  It states that the person is "on the phone". ?Having tried this phone number now more than 5 times and having left my name and number I will wait to hear back from them ?JMP ?

## 2021-10-08 NOTE — Telephone Encounter (Signed)
Noted  

## 2021-10-09 ENCOUNTER — Other Ambulatory Visit (HOSPITAL_COMMUNITY): Payer: Self-pay

## 2021-10-10 ENCOUNTER — Other Ambulatory Visit: Payer: Self-pay | Admitting: Physician Assistant

## 2021-10-10 DIAGNOSIS — M545 Low back pain, unspecified: Secondary | ICD-10-CM

## 2021-10-11 ENCOUNTER — Encounter: Payer: Self-pay | Admitting: Internal Medicine

## 2021-10-11 ENCOUNTER — Other Ambulatory Visit (HOSPITAL_COMMUNITY): Payer: Self-pay

## 2021-10-11 MED ORDER — HYDROCODONE-ACETAMINOPHEN 5-325 MG PO TABS
ORAL_TABLET | ORAL | 0 refills | Status: DC
  Filled 2021-10-11: qty 90, 30d supply, fill #0

## 2021-10-22 ENCOUNTER — Other Ambulatory Visit: Payer: Self-pay | Admitting: Internal Medicine

## 2021-10-22 ENCOUNTER — Encounter: Payer: Self-pay | Admitting: Internal Medicine

## 2021-10-22 ENCOUNTER — Other Ambulatory Visit (HOSPITAL_COMMUNITY): Payer: Self-pay

## 2021-10-23 ENCOUNTER — Encounter: Payer: Self-pay | Admitting: Internal Medicine

## 2021-10-23 ENCOUNTER — Other Ambulatory Visit (HOSPITAL_COMMUNITY): Payer: Self-pay

## 2021-10-23 MED ORDER — PANTOPRAZOLE SODIUM 40 MG PO TBEC
40.0000 mg | DELAYED_RELEASE_TABLET | Freq: Two times a day (BID) | ORAL | 3 refills | Status: DC
Start: 2021-10-23 — End: 2022-03-09
  Filled 2021-10-23 – 2021-11-07 (×2): qty 60, 30d supply, fill #0
  Filled 2021-11-21: qty 60, 30d supply, fill #1
  Filled 2021-12-19: qty 60, 30d supply, fill #2
  Filled 2022-01-17 – 2022-02-04 (×2): qty 60, 30d supply, fill #3

## 2021-10-29 ENCOUNTER — Encounter: Payer: Self-pay | Admitting: Internal Medicine

## 2021-10-30 ENCOUNTER — Other Ambulatory Visit (HOSPITAL_COMMUNITY): Payer: Self-pay

## 2021-11-07 ENCOUNTER — Other Ambulatory Visit (HOSPITAL_COMMUNITY): Payer: Self-pay

## 2021-11-07 ENCOUNTER — Encounter: Payer: Self-pay | Admitting: Internal Medicine

## 2021-11-07 ENCOUNTER — Other Ambulatory Visit: Payer: Self-pay | Admitting: Internal Medicine

## 2021-11-12 ENCOUNTER — Other Ambulatory Visit (INDEPENDENT_AMBULATORY_CARE_PROVIDER_SITE_OTHER): Payer: Commercial Managed Care - HMO

## 2021-11-12 ENCOUNTER — Other Ambulatory Visit: Payer: Self-pay | Admitting: Internal Medicine

## 2021-11-12 ENCOUNTER — Other Ambulatory Visit (HOSPITAL_COMMUNITY): Payer: Self-pay

## 2021-11-12 DIAGNOSIS — K7460 Unspecified cirrhosis of liver: Secondary | ICD-10-CM | POA: Diagnosis not present

## 2021-11-12 LAB — CBC WITH DIFFERENTIAL/PLATELET
Basophils Absolute: 0 10*3/uL (ref 0.0–0.1)
Basophils Relative: 0.6 % (ref 0.0–3.0)
Eosinophils Absolute: 0.3 10*3/uL (ref 0.0–0.7)
Eosinophils Relative: 6.8 % — ABNORMAL HIGH (ref 0.0–5.0)
HCT: 37.5 % (ref 36.0–46.0)
Hemoglobin: 12.7 g/dL (ref 12.0–15.0)
Lymphocytes Relative: 30 % (ref 12.0–46.0)
Lymphs Abs: 1.5 10*3/uL (ref 0.7–4.0)
MCHC: 33.8 g/dL (ref 30.0–36.0)
MCV: 92.6 fl (ref 78.0–100.0)
Monocytes Absolute: 0.3 10*3/uL (ref 0.1–1.0)
Monocytes Relative: 6.7 % (ref 3.0–12.0)
Neutro Abs: 2.8 10*3/uL (ref 1.4–7.7)
Neutrophils Relative %: 55.9 % (ref 43.0–77.0)
Platelets: 77 10*3/uL — ABNORMAL LOW (ref 150.0–400.0)
RBC: 4.05 Mil/uL (ref 3.87–5.11)
RDW: 16.3 % — ABNORMAL HIGH (ref 11.5–15.5)
WBC: 5 10*3/uL (ref 4.0–10.5)

## 2021-11-12 LAB — COMPREHENSIVE METABOLIC PANEL
ALT: 11 U/L (ref 0–35)
AST: 12 U/L (ref 0–37)
Albumin: 3.8 g/dL (ref 3.5–5.2)
Alkaline Phosphatase: 62 U/L (ref 39–117)
BUN: 12 mg/dL (ref 6–23)
CO2: 23 mEq/L (ref 19–32)
Calcium: 9.1 mg/dL (ref 8.4–10.5)
Chloride: 111 mEq/L (ref 96–112)
Creatinine, Ser: 0.96 mg/dL (ref 0.40–1.20)
GFR: 80.24 mL/min (ref >60.00)
Glucose, Bld: 92 mg/dL (ref 70–99)
Potassium: 4.2 mEq/L (ref 3.5–5.1)
Sodium: 141 mEq/L (ref 135–145)
Total Bilirubin: 0.5 mg/dL (ref 0.2–1.2)
Total Protein: 6.2 g/dL (ref 6.0–8.3)

## 2021-11-12 LAB — IBC + FERRITIN
Ferritin: 30.6 ng/mL (ref 10.0–291.0)
Iron: 57 ug/dL (ref 42–145)
Saturation Ratios: 18.3 % — ABNORMAL LOW (ref 20.0–50.0)
TIBC: 312.2 ug/dL (ref 250.0–450.0)
Transferrin: 223 mg/dL (ref 212.0–360.0)

## 2021-11-12 LAB — PROTIME-INR
INR: 1.2 ratio — ABNORMAL HIGH (ref 0.8–1.0)
Prothrombin Time: 12.6 s (ref 9.6–13.1)

## 2021-11-12 MED ORDER — PROMETHAZINE HCL 25 MG PO TABS
25.0000 mg | ORAL_TABLET | Freq: Four times a day (QID) | ORAL | 1 refills | Status: DC | PRN
  Filled 2021-11-12 (×2): qty 120, 30d supply, fill #0
  Filled 2021-11-21: qty 120, 30d supply, fill #1

## 2021-11-14 ENCOUNTER — Other Ambulatory Visit (HOSPITAL_COMMUNITY): Payer: Self-pay

## 2021-11-14 MED ORDER — CLINDAMYCIN HCL 300 MG PO CAPS
ORAL_CAPSULE | ORAL | 0 refills | Status: DC
  Filled 2021-11-14: qty 30, 7d supply, fill #0

## 2021-11-19 ENCOUNTER — Encounter: Payer: Self-pay | Admitting: Allergy and Immunology

## 2021-11-21 ENCOUNTER — Other Ambulatory Visit (HOSPITAL_COMMUNITY): Payer: Self-pay

## 2021-11-23 ENCOUNTER — Other Ambulatory Visit: Payer: Self-pay | Admitting: Allergy and Immunology

## 2021-11-23 ENCOUNTER — Other Ambulatory Visit (HOSPITAL_COMMUNITY): Payer: Self-pay

## 2021-11-25 ENCOUNTER — Other Ambulatory Visit (HOSPITAL_COMMUNITY): Payer: Self-pay

## 2021-11-25 MED ORDER — ALBUTEROL SULFATE HFA 108 (90 BASE) MCG/ACT IN AERS
2.0000 | INHALATION_SPRAY | Freq: Four times a day (QID) | RESPIRATORY_TRACT | 1 refills | Status: DC | PRN
  Filled 2021-11-25: qty 8.5, 25d supply, fill #0
  Filled 2022-01-17 – 2022-02-04 (×2): qty 6.7, 25d supply, fill #0
  Filled 2022-04-08 – 2022-05-27 (×5): qty 6.7, 25d supply, fill #1

## 2021-12-02 ENCOUNTER — Other Ambulatory Visit: Payer: Self-pay | Admitting: Internal Medicine

## 2021-12-02 ENCOUNTER — Other Ambulatory Visit (HOSPITAL_COMMUNITY): Payer: Self-pay

## 2021-12-02 MED ORDER — PROMETHAZINE HCL 25 MG PO TABS
25.0000 mg | ORAL_TABLET | Freq: Four times a day (QID) | ORAL | 1 refills | Status: DC | PRN
  Filled 2021-12-02 – 2021-12-06 (×2): qty 120, 30d supply, fill #0
  Filled 2021-12-19: qty 120, 30d supply, fill #1

## 2021-12-05 ENCOUNTER — Other Ambulatory Visit (HOSPITAL_COMMUNITY): Payer: Self-pay

## 2021-12-06 ENCOUNTER — Other Ambulatory Visit (HOSPITAL_COMMUNITY): Payer: Self-pay

## 2021-12-07 ENCOUNTER — Other Ambulatory Visit (HOSPITAL_COMMUNITY): Payer: Self-pay

## 2021-12-16 ENCOUNTER — Other Ambulatory Visit (HOSPITAL_COMMUNITY): Payer: Self-pay

## 2021-12-16 MED ORDER — AZITHROMYCIN 250 MG PO TABS
ORAL_TABLET | ORAL | 0 refills | Status: DC
  Filled 2021-12-16: qty 6, 5d supply, fill #0

## 2021-12-16 MED ORDER — FLUCONAZOLE 100 MG PO TABS
ORAL_TABLET | ORAL | 0 refills | Status: DC
  Filled 2021-12-16 – 2022-01-17 (×2): qty 5, 10d supply, fill #0

## 2021-12-19 ENCOUNTER — Other Ambulatory Visit (HOSPITAL_COMMUNITY): Payer: Self-pay

## 2021-12-24 ENCOUNTER — Encounter: Payer: Self-pay | Admitting: Internal Medicine

## 2021-12-24 ENCOUNTER — Other Ambulatory Visit: Payer: Self-pay | Admitting: Internal Medicine

## 2021-12-24 ENCOUNTER — Other Ambulatory Visit (HOSPITAL_COMMUNITY): Payer: Self-pay

## 2021-12-24 MED ORDER — GABAPENTIN 300 MG PO CAPS
ORAL_CAPSULE | ORAL | 1 refills | Status: DC
  Filled 2021-12-24 – 2022-01-03 (×2): qty 180, 90d supply, fill #0
  Filled 2022-01-17 – 2022-02-10 (×7): qty 180, 90d supply, fill #1

## 2021-12-24 MED ORDER — PROMETHAZINE HCL 25 MG PO TABS
25.0000 mg | ORAL_TABLET | Freq: Four times a day (QID) | ORAL | 1 refills | Status: DC | PRN
  Filled 2021-12-24: qty 120, 30d supply, fill #0
  Filled 2022-01-07 (×2): qty 120, 30d supply, fill #1

## 2021-12-24 MED ORDER — PROMETHAZINE HCL 25 MG PO TABS
25.0000 mg | ORAL_TABLET | Freq: Four times a day (QID) | ORAL | 1 refills | Status: DC | PRN
Start: 2021-12-24 — End: 2022-01-10

## 2021-12-28 ENCOUNTER — Other Ambulatory Visit (HOSPITAL_COMMUNITY): Payer: Self-pay

## 2022-01-01 ENCOUNTER — Other Ambulatory Visit (HOSPITAL_COMMUNITY): Payer: Self-pay

## 2022-01-03 ENCOUNTER — Other Ambulatory Visit (HOSPITAL_COMMUNITY): Payer: Self-pay

## 2022-01-06 ENCOUNTER — Encounter: Payer: Self-pay | Admitting: Internal Medicine

## 2022-01-07 ENCOUNTER — Other Ambulatory Visit (HOSPITAL_COMMUNITY): Payer: Self-pay

## 2022-01-08 NOTE — Telephone Encounter (Signed)
Please determine if she is using pantoprazole 40 BID-AC? Is she using famotidine 40 mg twice daily? Avoid all NSAIDs CBC, CMP, INR, ferritin, IBC panel, B12, TSH Office visit me or APP

## 2022-01-09 ENCOUNTER — Other Ambulatory Visit: Payer: Self-pay

## 2022-01-09 ENCOUNTER — Other Ambulatory Visit (INDEPENDENT_AMBULATORY_CARE_PROVIDER_SITE_OTHER): Payer: Commercial Managed Care - HMO

## 2022-01-09 DIAGNOSIS — K7460 Unspecified cirrhosis of liver: Secondary | ICD-10-CM

## 2022-01-09 DIAGNOSIS — R1084 Generalized abdominal pain: Secondary | ICD-10-CM

## 2022-01-09 DIAGNOSIS — E039 Hypothyroidism, unspecified: Secondary | ICD-10-CM

## 2022-01-09 LAB — PROTIME-INR
INR: 1.2 ratio — ABNORMAL HIGH (ref 0.8–1.0)
Prothrombin Time: 12.8 s (ref 9.6–13.1)

## 2022-01-09 LAB — COMPREHENSIVE METABOLIC PANEL
ALT: 12 U/L (ref 0–35)
AST: 15 U/L (ref 0–37)
Albumin: 4.1 g/dL (ref 3.5–5.2)
Alkaline Phosphatase: 62 U/L (ref 39–117)
BUN: 9 mg/dL (ref 6–23)
CO2: 23 mEq/L (ref 19–32)
Calcium: 8.5 mg/dL (ref 8.4–10.5)
Chloride: 111 mEq/L (ref 96–112)
Creatinine, Ser: 0.95 mg/dL (ref 0.40–1.20)
GFR: 81.16 mL/min (ref >60.00)
Glucose, Bld: 90 mg/dL (ref 70–99)
Potassium: 4.1 mEq/L (ref 3.5–5.1)
Sodium: 142 mEq/L (ref 135–145)
Total Bilirubin: 0.5 mg/dL (ref 0.2–1.2)
Total Protein: 6.5 g/dL (ref 6.0–8.3)

## 2022-01-09 LAB — CBC WITH DIFFERENTIAL/PLATELET
Basophils Absolute: 0 10*3/uL (ref 0.0–0.1)
Basophils Relative: 0.5 % (ref 0.0–3.0)
Eosinophils Absolute: 0.2 10*3/uL (ref 0.0–0.7)
Eosinophils Relative: 5.3 % — ABNORMAL HIGH (ref 0.0–5.0)
HCT: 40.9 % (ref 36.0–46.0)
Hemoglobin: 13.4 g/dL (ref 12.0–15.0)
Lymphocytes Relative: 28.1 % (ref 12.0–46.0)
Lymphs Abs: 1.1 10*3/uL (ref 0.7–4.0)
MCHC: 32.8 g/dL (ref 30.0–36.0)
MCV: 91.8 fl (ref 78.0–100.0)
Monocytes Absolute: 0.2 10*3/uL (ref 0.1–1.0)
Monocytes Relative: 5.9 % (ref 3.0–12.0)
Neutro Abs: 2.3 10*3/uL (ref 1.4–7.7)
Neutrophils Relative %: 60.2 % (ref 43.0–77.0)
Platelets: 81 10*3/uL — ABNORMAL LOW (ref 150.0–400.0)
RBC: 4.46 Mil/uL (ref 3.87–5.11)
RDW: 14 % (ref 11.5–15.5)
WBC: 3.9 10*3/uL — ABNORMAL LOW (ref 4.0–10.5)

## 2022-01-09 LAB — VITAMIN B12: Vitamin B-12: 427 pg/mL (ref 211–911)

## 2022-01-09 LAB — TSH: TSH: 2.48 u[IU]/mL (ref 0.35–5.50)

## 2022-01-10 ENCOUNTER — Other Ambulatory Visit: Payer: Self-pay

## 2022-01-10 LAB — IRON,TIBC AND FERRITIN PANEL
%SAT: 21 % (calc) (ref 16–45)
Ferritin: 13 ng/mL — ABNORMAL LOW (ref 16–154)
Iron: 70 ug/dL (ref 40–190)
TIBC: 328 mcg/dL (calc) (ref 250–450)

## 2022-01-10 MED ORDER — DICYCLOMINE HCL 10 MG PO CAPS: 20.0000 mg | ORAL_CAPSULE | Freq: Three times a day (TID) | ORAL | 0 refills | Status: DC | PRN

## 2022-01-10 NOTE — Telephone Encounter (Signed)
Can try bentyl 20 mg TID-PRN

## 2022-01-10 NOTE — Telephone Encounter (Signed)
Can we please contact the patient and clean up her medication list.  There are multiple medicines on there that seem to be like old prescriptions.  I need to know what she is currently taking. She is complaining of heartburn, abdominal pain, and nausea. Please let her know that her lab work looks stable; no concerning findings on the lab work.  Stable chronic thrombocytopenia and mild coagulopathy.  All related to known cirrhosis.  Albumin, nutritional protein, is normal which is reassuring.  For heartburn it appears that she is taking pantoprazole 40 mg twice daily AC and famotidine 40 mg every 12 hours --if this is the case this is maximum acid suppression. She also has sucralfate 1 g 3 times daily before meals and at bedtime on her med list.  This would be expected to help gastritis, please determine if she is taking this.  If not it may be used to see if pain improves Stop vitamin C, this is acidic and may be worsening her heartburn/dyspepsia Promethazine 25 mg can be used every 6 hours as needed for nausea Ondansetron 4 mg every 8 hours ODT as a backup for refractory promethazine

## 2022-01-14 ENCOUNTER — Other Ambulatory Visit: Payer: Self-pay

## 2022-01-14 DIAGNOSIS — R1084 Generalized abdominal pain: Secondary | ICD-10-CM

## 2022-01-14 DIAGNOSIS — K7460 Unspecified cirrhosis of liver: Secondary | ICD-10-CM

## 2022-01-14 NOTE — Telephone Encounter (Signed)
Yes CT abd/pelvis with IV contrast Abd pain in pt with cirrhosis and portal HTN

## 2022-01-17 ENCOUNTER — Encounter (HOSPITAL_COMMUNITY): Payer: Self-pay | Admitting: *Deleted

## 2022-01-17 ENCOUNTER — Other Ambulatory Visit: Payer: Self-pay | Admitting: Internal Medicine

## 2022-01-17 ENCOUNTER — Other Ambulatory Visit (HOSPITAL_COMMUNITY): Payer: Self-pay

## 2022-01-17 MED ORDER — DICYCLOMINE HCL 10 MG PO CAPS
20.0000 mg | ORAL_CAPSULE | Freq: Three times a day (TID) | ORAL | 0 refills | Status: DC | PRN
  Filled 2022-01-17 – 2023-01-10 (×10): qty 90, 15d supply, fill #0

## 2022-01-17 MED ORDER — PROMETHAZINE HCL 25 MG PO TABS
25.0000 mg | ORAL_TABLET | Freq: Four times a day (QID) | ORAL | 1 refills | Status: DC | PRN
  Filled 2022-01-17 – 2022-02-05 (×7): qty 120, 30d supply, fill #0
  Filled 2022-02-17 – 2022-02-18 (×2): qty 120, 30d supply, fill #1

## 2022-01-18 ENCOUNTER — Other Ambulatory Visit (HOSPITAL_COMMUNITY): Payer: Self-pay

## 2022-01-20 ENCOUNTER — Other Ambulatory Visit (HOSPITAL_COMMUNITY): Payer: Self-pay

## 2022-01-21 ENCOUNTER — Other Ambulatory Visit (HOSPITAL_COMMUNITY): Payer: Self-pay

## 2022-01-22 ENCOUNTER — Other Ambulatory Visit (HOSPITAL_COMMUNITY): Payer: Self-pay

## 2022-01-22 NOTE — Progress Notes (Unsigned)
01/22/2022 Linda Becker [id number] 23-May-1991  Referring provider: Deon Pilling, NP Primary GI doctor: Dr. [name]  ASSESSMENT AND PLAN:   01/09/2022 WBC 3.9 HGB 13.4 MCV 91.8 Platelets 81.0 01/09/2022 AST 15 ALT 12 Alkphos 62 TBili 0.5 01/09/2022 INR 1.2 MELD 3.0: 9 at 01/09/2022  3:26 PM MELD-Na: 8 at 01/09/2022  3:26 PM Calculated from: Serum Creatinine: 0.95 mg/dL (Using min of 1 mg/dL) at 01/09/2022  3:26 PM Serum Sodium: 142 mEq/L (Using max of 137 mEq/L) at 01/09/2022  3:26 PM Total Bilirubin: 0.5 mg/dL (Using min of 1 mg/dL) at 01/09/2022  3:26 PM Serum Albumin: 4.1 g/dL (Using max of 3.5 g/dL) at 01/09/2022  3:26 PM INR(ratio): 1.2 ratio at 01/09/2022  3:26 PM Age at listing (hypothetical): 31 years Sex: Female at 01/09/2022  3:26 PM  -History of EVL, on nadolol, consider repeat endoscopy in November 2023 -[place] screening due 03/15/2021 -Hepatic encephalopathy-on 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 and Due to hepatic encephalopathy and slowed reflexes, patient was advised not to drive. - Ascites- {acascites:27855} -Nutrition and low sodium diet discussed with patient and information given -Continue daily multivitamin -Recommended 30 minutes of aerobic and resistance exercise 3 days/week  Recurrent chronic abdominal pain  History of Present Illness:  31 y.o. 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  With history of portal hypertension, variceal bleed with previous ligation, thrombocytopenia, GAVE, IDA. Patient is also following with Dr. Loletha Grayer with [facility]. 08/09/2021 office visit with Dr. [name] for cirrhosis.  Last EGD was ***.  {cirrhosishepaticencephalopathy:26796} She {Denies/complains:31533} swelling.  She {Actions; are/are not:16769} on spironolactone and lasix.   Wt Readings from Last 3 Encounters:  08/30/21 (!) 301 lb 6.4 oz (136.7 kg)  08/28/21 296 lb 9.6 oz (134.5 kg)  08/26/21 294 lb 9.6 oz (133.6 kg)    Last HCC screen: *** Last AFP No results found for requested labs within last 1095 days. No results found for requested labs within last 1095 days.  Last INR: 01/09/2022 1.2   Hepatitis immunity status:  01/29/2020 HepA Reactive  01/29/2020 HepBsAG NON REACTIVE  No results found for requested labs within last 1095 days. HepAsAB No results found for requested labs within last 1095 days.  No results found for requested labs within last 1095 days. HepCAB No results found for requested labs within last 1095 days.        Current Medications:     Facility-Administered Medications Ordered in Other Visits (Endocrine & Metabolic):    methylPREDNISolone sodium succinate (SOLU-MEDROL) 125 mg/2 mL injection 125 mg  Current Outpatient Medications (Cardiovascular):    nadolol (CORGARD) 20 MG tablet, Take 1 tablet (20 mg total) by mouth 2 (two) times daily.   verapamil (CALAN-SR) 180 MG CR tablet, Take 1 tablet (180 mg total) by mouth at bedtime.   Facility-Administered Medications Ordered in Other Visits (Cardiovascular):    EPINEPHrine (EPI-PEN) injection 0.3 mg  Current Outpatient Medications (Respiratory):    AIRDUO DIGIHALER 232-14 MCG/ACT AEPB, Inhale 1 puff into the lungs in the morning and at bedtime.   albuterol (PROVENTIL) (2.5 MG/3ML) 0.083% nebulizer solution, Inhale 1 ampule by nebulization every 4 (four) hours as needed for wheezing or shortness of breath.   albuterol (VENTOLIN HFA) 108 (90 Base) MCG/ACT inhaler, Inhale 2 puffs into the lungs every 6 (six) hours as needed for wheezing or shortness  of breath.   Albuterol Sulfate, sensor, (PROAIR DIGIHALER) 108 (90 Base) MCG/ACT AEPB, Inhale 2 puffs into the lungs every 4 hours as needed (For coughing and wheezing).   BREZTRI AEROSPHERE 160-9-4.8 MCG/ACT AERO, Inhale 2 puffs into the  lungs 2 (two) times daily.   budesonide (RHINOCORT AQUA) 32 MCG/ACT nasal spray, Place 1 spray into both nostrils in the morning and at bedtime.   Cetirizine HCl 10 MG CAPS, Take 10 mg by mouth 2 (two) times daily.    chlorpheniramine-HYDROcodone 10-8 MG/5ML, Take 5 mLs by mouth every 12 (twelve) hours as needed   dextromethorphan-guaiFENesin (MUCINEX DM) 30-600 MG 12hr tablet, Take 1 tablet by mouth 2 (two) times daily.   diphenhydrAMINE (BENADRYL) 25 MG tablet, Take 50 mg by mouth at bedtime as needed for allergies or sleep.   promethazine (PHENERGAN) 25 MG tablet, Take 1 tablet by mouth every 6 hours as needed for vomiting or nausea.   SPIRIVA RESPIMAT 1.25 MCG/ACT AERS, Inhale 2 puffs into the lungs daily.   Facility-Administered Medications Ordered in Other Visits (Respiratory):    albuterol (VENTOLIN HFA) 108 (90 Base) MCG/ACT inhaler 2 puff   diphenhydrAMINE (BENADRYL) injection 50 mg   promethazine (PHENERGAN) tablet 12.5-25 mg **OR** promethazine (PHENERGAN) suppository 12.5-25 mg  Current Outpatient Medications (Analgesics):    buprenorphine (BUTRANS) 15 MCG/HR, Place 1 patch onto the skin once a week.   HYDROcodone-acetaminophen (NORCO/VICODIN) 5-325 MG tablet, Take 1 tablet by mouth up to 3 (three) times daily as needed. (Patient not taking: Reported on 08/13/2021)   HYDROcodone-acetaminophen (NORCO/VICODIN) 5-325 MG tablet, Take 1 tablet by mouth up to 3 (three) times daily.   HYDROcodone-acetaminophen (NORCO/VICODIN) 5-325 MG tablet, Take 1 tablet by mouth every 8 (eight) hours as needed.   HYDROcodone-acetaminophen (NORCO/VICODIN) 5-325 MG tablet, Take 1 tablet by mouth every 8 hrs if needed.     Facility-Administered Medications Ordered in Other Visits (Hematological):    iron sucrose (VENOFER) 200 mg in sodium chloride 0.9 % 100 mL IVPB  Current Outpatient Medications (Other):    ARIPiprazole (ABILIFY) 5 MG tablet, Take 1 tablet (5 mg total) by mouth daily.   Ascorbic  Acid (VITAMIN C) 1000 MG tablet, Take 2,000 mg by mouth 2 (two) times daily.   azithromycin (ZITHROMAX) 250 MG tablet, Take as directed by mouth for 5 days   CALCIUM PO, Take 1 tablet by mouth 2 (two) times daily.   cefdinir (OMNICEF) 300 MG capsule, Take 1 capsule (300 mg total) by mouth 2 (two) times daily.   clindamycin (CLEOCIN) 300 MG capsule, Take 2 capsules by mouth now then 6 hours later start taking 1 capsule by mouth 4 times a day for 7 days   dicyclomine (BENTYL) 10 MG capsule, Take 2 capsules (20 mg total) by mouth 3 (three) times daily as needed for spasms.   doxycycline (VIBRA-TABS) 100 MG tablet, Take 1 tablet (100 mg total) by mouth 2 (two) times daily.   escitalopram (LEXAPRO) 20 MG tablet, Take 1 tablet (20 mg total) by mouth daily.   famotidine (PEPCID) 40 MG tablet, Take 1 tablet by mouth 2 times daily.   fluconazole (DIFLUCAN) 100 MG tablet, Take 1 tablet by mouth every other day for 5 doses as directed   fluconazole (DIFLUCAN) 150 MG tablet, Take 1 tablet (150 mg total) by mouth; may repeat in 3 days if needed   gabapentin (NEURONTIN) 300 MG capsule, Take 1 capsule (300 mg total) by mouth daily.   gabapentin (NEURONTIN) 300 MG capsule, Take  1 capsule by mouth 2 (two) times daily.   Olopatadine HCl 0.2 % SOLN, Place 1 drop into both eyes daily as needed   pantoprazole (PROTONIX) 40 MG tablet, Take 1 tablet by mouth 2 times daily before a meal.   Prenatal Vit-Fe Fumarate-FA (MULTIVITAMIN-PRENATAL) 27-0.8 MG TABS tablet, Take 1 tablet by mouth 2 (two) times daily.   sucralfate (CARAFATE) 1 GM/10ML suspension, Take 10 mLs by mouth 4 times daily -  with meals and at bedtime.   temazepam (RESTORIL) 7.5 MG capsule, Take 1 capsule (7.5 mg total) by mouth at bedtime as needed.   ursodiol (ACTIGALL) 500 MG tablet, Take 1 tablet (500 mg total) by mouth 3 (three) times daily   VITAMIN D PO, Take 1 capsule by mouth 2 (two) times daily.   VITAMIN E PO, Take 1 capsule by mouth  daily.   Facility-Administered Medications Ordered in Other Visits (Other):    0.9 %  sodium chloride infusion   famotidine (PEPCID) IVPB 20 mg premix No current facility-administered medications for this visit.  Surgical History:  She  has a past surgical history that includes broken right femur (2008); Tonsillectomy (2006); Wisdom tooth extraction; Dilation and curettage of uterus; OB ultrasound (N/A, 12/01/2017); Colonoscopy with propofol (N/A, 09/03/2018); Esophagogastroduodenoscopy (egd) with propofol (N/A, 09/03/2018); polypectomy (09/03/2018); biopsy (09/03/2018); IR Transcatheter BX (01/19/2019); IR US Guide Vasc Access Right (01/19/2019); IR Venogram Hepatic W Hemodynamic Evaluation (01/19/2019); Laparoscopic gastric sleeve resection (N/A, 06/28/2019); Esophagogastroduodenoscopy (egd) with propofol (N/A, 01/27/2020); gastric varices banding (01/27/2020); Esophagogastroduodenoscopy (egd) with propofol (N/A, 03/12/2020); Esophagogastroduodenoscopy (egd) with propofol (N/A, 09/26/2020); biopsy (09/26/2020); Esophagogastroduodenoscopy (egd) with propofol (N/A, 04/29/2021); and Hot hemostasis (N/A, 04/29/2021). Family History:  Her family history includes Arthritis in her father, maternal grandmother, and mother; Asthma in her father; Bleeding Disorder in her mother; Breast cancer in her maternal aunt, maternal grandmother, paternal aunt, and paternal grandmother; Colon cancer in her paternal grandfather; Colon polyps in her father and mother; Diabetes in her father, maternal grandfather, mother, paternal grandfather, and paternal grandmother; Esophageal cancer in her paternal aunt; Heart disease in her father and mother; Hyperlipidemia in her father and mother; Hypertension in her father and mother; Kidney disease in her mother; Liver cancer in her paternal grandfather; Liver disease (age of onset: 2) in her mother; Pancreatic cancer in her maternal grandfather; Stomach cancer in her paternal grandfather; Thyroid  disease in her maternal grandmother. Social History:   reports that she has never smoked. She has never used smokeless tobacco. She reports that she does not drink alcohol and does not use drugs.  Current Medications, Allergies, Past Medical History, Past Surgical History, Family History and Social History were reviewed in [employer] record.  Physical Exam: There were no vitals taken for this visit. General:   Pleasant, well developed female in no acute distress Heart : Regular rate and rhythm; no murmurs Pulm: Clear anteriorly; no wheezing Abdomen:  {BlankSingle:19197::"Distended","Ridged","Soft"}, {BlankSingle:19197::"Flat","Obese","Non-distended"} AB, {BlankSingle:19197::"Absent","Hyperactive, tinkling","Hypoactive","Sluggish","Active"} bowel sounds. {actendernessAB:27319} tenderness {anatomy; site abdomen:5010}. {BlankMultiple:19196::"Without guarding","With guarding","Without rebound","With rebound"}, No organomegaly appreciated. Rectal: {acrectalexam:27461} Extremities:  {With/without:5700}  edema. Neurologic:  Alert and  oriented x4;  No focal deficits.  Psych:  Cooperative. Normal mood and affect.   Vladimir Crofts, PA-C 01/22/22

## 2022-01-23 ENCOUNTER — Other Ambulatory Visit (INDEPENDENT_AMBULATORY_CARE_PROVIDER_SITE_OTHER): Payer: Self-pay

## 2022-01-23 ENCOUNTER — Ambulatory Visit (INDEPENDENT_AMBULATORY_CARE_PROVIDER_SITE_OTHER): Payer: Self-pay | Admitting: Physician Assistant

## 2022-01-23 ENCOUNTER — Other Ambulatory Visit (HOSPITAL_COMMUNITY): Payer: Self-pay

## 2022-01-23 VITALS — HR 84 | Ht 66.0 in | Wt 274.0 lb

## 2022-01-23 DIAGNOSIS — I8501 Esophageal varices with bleeding: Secondary | ICD-10-CM

## 2022-01-23 DIAGNOSIS — K7460 Unspecified cirrhosis of liver: Secondary | ICD-10-CM

## 2022-01-23 DIAGNOSIS — K31819 Angiodysplasia of stomach and duodenum without bleeding: Secondary | ICD-10-CM

## 2022-01-23 DIAGNOSIS — K7581 Nonalcoholic steatohepatitis (NASH): Secondary | ICD-10-CM

## 2022-01-23 DIAGNOSIS — K2970 Gastritis, unspecified, without bleeding: Secondary | ICD-10-CM

## 2022-01-23 DIAGNOSIS — R112 Nausea with vomiting, unspecified: Secondary | ICD-10-CM

## 2022-01-23 DIAGNOSIS — K2990 Gastroduodenitis, unspecified, without bleeding: Secondary | ICD-10-CM

## 2022-01-23 DIAGNOSIS — K766 Portal hypertension: Secondary | ICD-10-CM

## 2022-01-23 DIAGNOSIS — D509 Iron deficiency anemia, unspecified: Secondary | ICD-10-CM

## 2022-01-23 DIAGNOSIS — D696 Thrombocytopenia, unspecified: Secondary | ICD-10-CM

## 2022-01-23 LAB — CBC WITH DIFFERENTIAL/PLATELET
Basophils Absolute: 0 10*3/uL (ref 0.0–0.1)
Basophils Relative: 0.5 % (ref 0.0–3.0)
Eosinophils Absolute: 0.3 10*3/uL (ref 0.0–0.7)
Eosinophils Relative: 6.3 % — ABNORMAL HIGH (ref 0.0–5.0)
HCT: 39.5 % (ref 36.0–46.0)
Hemoglobin: 12.9 g/dL (ref 12.0–15.0)
Lymphocytes Relative: 22.7 % (ref 12.0–46.0)
Lymphs Abs: 0.9 10*3/uL (ref 0.7–4.0)
MCHC: 32.6 g/dL (ref 30.0–36.0)
MCV: 91.8 fl (ref 78.0–100.0)
Monocytes Absolute: 0.2 10*3/uL (ref 0.1–1.0)
Monocytes Relative: 5.2 % (ref 3.0–12.0)
Neutro Abs: 2.6 10*3/uL (ref 1.4–7.7)
Neutrophils Relative %: 65.3 % (ref 43.0–77.0)
Platelets: 74 10*3/uL — ABNORMAL LOW (ref 150.0–400.0)
RBC: 4.3 Mil/uL (ref 3.87–5.11)
RDW: 14.1 % (ref 11.5–15.5)
WBC: 4 10*3/uL (ref 4.0–10.5)

## 2022-01-23 LAB — AMMONIA: Ammonia: 34 umol/L (ref 11–35)

## 2022-01-23 MED ORDER — METOCLOPRAMIDE HCL 5 MG PO TABS
5.0000 mg | ORAL_TABLET | Freq: Three times a day (TID) | ORAL | 0 refills | Status: DC | PRN
  Filled 2022-01-23 – 2022-05-15 (×4): qty 30, 10d supply, fill #0

## 2022-01-23 NOTE — Patient Instructions (Signed)
   Reglan or metoclopramide  Can be used for gastroparesis or slow stomach, nausea, vomiting, GERD.   Continue the medication as needed up to 3 times a day, on an empty stomach 30 minutes before eating. It may take a few weeks for your stomach condition to start to get better. However, do not take this medicine for longer than 12 weeks.  The longer you take this medicine, and the more you take it, the greater your chances are of developing serious side effects.  Some people may get a severe muscle problem called tardive dyskinesia. This problem may lessen or go away after stopping this drug, but it may not go away. The risk is greater with diabetes and in older adults, especially older females. The risk is greater with longer use or higher doses, but it may also occur after short-term use with low doses. Call your doctor right away if you have trouble controlling body movements or problems with your tongue, face, mouth, or jaw like tongue sticking out, puffing cheeks, mouth puckering, or chewing.   Please monitor for worsening depression or thoughts of suicide, any aggressiveness or hyperactivity.  If this happen please stop and call your physician right away.  No aleve, ibuprofen, goody powders, as these are antiinflammatories and can cause inflammation in your stomach, increase bleeding risk and cause ulcers.  You can talk with PCP about alternative pain options.  Can do tyelnol max 3000 mg a day, salon pas patches are over the counter and voltern gel is topical antiinflammatory that is safe.    You have been scheduled for an abdominal ultrasound at [facility] (1st floor of hospital) on 01/30/2022 at 11:30am. Please arrive 15 minutes prior to your appointment for registration. Make certain not to have anything to eat or drink 6 hours prior to your appointment. Should you need to reschedule your appointment, please contact radiology at [phone]. This test typically takes about 30  minutes to perform.   [phone]  Contact Pre service center for cost information   Due to recent changes in healthcare laws, you may see the results of your imaging and laboratory studies on MyChart before your provider has had a chance to review them.  We understand that in some cases there may be results that are confusing or concerning to you. Not all laboratory results come back in the same time frame and the provider may be waiting for multiple results in order to interpret others.  Please give [place] 48 hours in order for your provider to thoroughly review all the results before contacting the office for clarification of your results.    I appreciate the  opportunity to care for you  Thank You   [facility]

## 2022-01-24 NOTE — Progress Notes (Signed)
Addendum: Reviewed and agree with assessment and management plan. Joella Saefong M, MD  

## 2022-01-29 ENCOUNTER — Other Ambulatory Visit (HOSPITAL_COMMUNITY): Payer: Self-pay

## 2022-01-30 ENCOUNTER — Ambulatory Visit (HOSPITAL_COMMUNITY)
Admission: RE | Admit: 2022-01-30 | Discharge: 2022-01-30 | Disposition: A | Discharge status: Home / Self Care | Payer: Medicaid Other | Source: Ambulatory Visit | Attending: Physician Assistant | Admitting: Physician Assistant

## 2022-01-30 ENCOUNTER — Encounter: Payer: Self-pay | Admitting: Internal Medicine

## 2022-01-30 ENCOUNTER — Other Ambulatory Visit (HOSPITAL_COMMUNITY): Payer: Self-pay

## 2022-01-30 DIAGNOSIS — K7581 Nonalcoholic steatohepatitis (NASH): Secondary | ICD-10-CM | POA: Insufficient documentation

## 2022-01-30 DIAGNOSIS — K7460 Unspecified cirrhosis of liver: Secondary | ICD-10-CM | POA: Insufficient documentation

## 2022-01-31 NOTE — Telephone Encounter (Signed)
I recommend that she proceed with CT scan abdomen pelvis with contrast Lumbar spine needs to be ordered by the provider responsible for possible lower back pain/pathology

## 2022-02-03 ENCOUNTER — Other Ambulatory Visit (HOSPITAL_COMMUNITY): Payer: Self-pay

## 2022-02-04 ENCOUNTER — Other Ambulatory Visit (HOSPITAL_COMMUNITY): Payer: Self-pay

## 2022-02-05 ENCOUNTER — Other Ambulatory Visit (HOSPITAL_COMMUNITY): Payer: Self-pay

## 2022-02-07 ENCOUNTER — Other Ambulatory Visit (HOSPITAL_COMMUNITY): Payer: Self-pay

## 2022-02-07 ENCOUNTER — Encounter (HOSPITAL_COMMUNITY): Payer: Self-pay

## 2022-02-07 ENCOUNTER — Ambulatory Visit (HOSPITAL_COMMUNITY)
Admission: RE | Admit: 2022-02-07 | Discharge: 2022-02-07 | Disposition: A | Discharge status: Home / Self Care | Payer: Medicaid Other | Source: Ambulatory Visit | Attending: Internal Medicine | Admitting: Internal Medicine

## 2022-02-07 DIAGNOSIS — K7460 Unspecified cirrhosis of liver: Secondary | ICD-10-CM

## 2022-02-07 DIAGNOSIS — R1084 Generalized abdominal pain: Secondary | ICD-10-CM

## 2022-02-10 ENCOUNTER — Other Ambulatory Visit (HOSPITAL_COMMUNITY): Payer: Self-pay

## 2022-02-12 ENCOUNTER — Other Ambulatory Visit (HOSPITAL_COMMUNITY): Payer: Self-pay

## 2022-02-14 ENCOUNTER — Telehealth: Payer: Self-pay | Admitting: *Deleted

## 2022-02-14 NOTE — Telephone Encounter (Signed)
Left message for patient to call back. I currently have availability to complete endoscopy at the hospital on 04/15/22 and 05/12/22.  ---- Message ----- From: Oda Kilts, CMA Sent: 01/24/2022   2:23 PM EDT To: Larina Bras, CMA Subject: EGD [facility]                                        Vicie Mutters wants this pt to have a EGD at [facility]  She is aware that she has to be put on [facility] waitlist. She has no insurance coverage either   Thanks

## 2022-02-18 ENCOUNTER — Other Ambulatory Visit (HOSPITAL_COMMUNITY): Payer: Self-pay

## 2022-02-18 ENCOUNTER — Other Ambulatory Visit: Payer: Self-pay | Admitting: Internal Medicine

## 2022-02-19 ENCOUNTER — Other Ambulatory Visit (HOSPITAL_COMMUNITY): Payer: Self-pay

## 2022-02-19 MED ORDER — GABAPENTIN 300 MG PO CAPS
300.0000 mg | ORAL_CAPSULE | Freq: Two times a day (BID) | ORAL | 1 refills | Status: DC
  Filled 2022-02-19 – 2022-03-09 (×2): qty 180, 90d supply, fill #0
  Filled 2022-03-25 – 2022-03-28 (×3): qty 180, 90d supply, fill #1

## 2022-02-21 NOTE — Telephone Encounter (Signed)
I have left an additional message on patient phone to call back.

## 2022-02-22 ENCOUNTER — Other Ambulatory Visit: Payer: Self-pay | Admitting: Internal Medicine

## 2022-02-28 ENCOUNTER — Other Ambulatory Visit: Payer: Self-pay | Admitting: Internal Medicine

## 2022-02-28 DIAGNOSIS — R1084 Generalized abdominal pain: Secondary | ICD-10-CM

## 2022-02-28 DIAGNOSIS — K7460 Unspecified cirrhosis of liver: Secondary | ICD-10-CM

## 2022-02-28 NOTE — Telephone Encounter (Signed)
I have spoken to patient and she has been scheduled at [facility] hospital for endoscopy on 05/12/22 at 1115 am, 945 am arrival. She has been given verbal prep instructions as well as info on date/time/location of appointment. Patient advised to have care partner 79 or older to accompany her for her procedure. Patient verbalizes understanding of this information. A written copy of prep instructions have been made available to patient via mychart and have been sent to her in the mail as well.

## 2022-02-28 NOTE — Telephone Encounter (Signed)
I am sorry to hear the issues that she had with [facility] medical. We can substitute MRI abdomen with and without contrast in place of the CT. Office follow-up with me can be scheduled

## 2022-03-01 ENCOUNTER — Other Ambulatory Visit: Payer: Self-pay | Admitting: Internal Medicine

## 2022-03-03 ENCOUNTER — Other Ambulatory Visit: Payer: Self-pay

## 2022-03-03 DIAGNOSIS — K7460 Unspecified cirrhosis of liver: Secondary | ICD-10-CM

## 2022-03-03 DIAGNOSIS — R1084 Generalized abdominal pain: Secondary | ICD-10-CM

## 2022-03-04 ENCOUNTER — Other Ambulatory Visit: Payer: Self-pay | Admitting: Internal Medicine

## 2022-03-05 ENCOUNTER — Other Ambulatory Visit (HOSPITAL_COMMUNITY): Payer: Self-pay

## 2022-03-05 MED ORDER — PROMETHAZINE HCL 25 MG PO TABS
25.0000 mg | ORAL_TABLET | Freq: Four times a day (QID) | ORAL | 1 refills | Status: DC | PRN
  Filled 2022-03-05 – 2022-03-09 (×2): qty 120, 30d supply, fill #0
  Filled 2022-04-06 – 2022-04-08 (×2): qty 120, 30d supply, fill #1

## 2022-03-09 ENCOUNTER — Other Ambulatory Visit: Payer: Self-pay | Admitting: Internal Medicine

## 2022-03-09 ENCOUNTER — Other Ambulatory Visit (HOSPITAL_COMMUNITY): Payer: Self-pay

## 2022-03-10 ENCOUNTER — Other Ambulatory Visit (HOSPITAL_COMMUNITY): Payer: Self-pay

## 2022-03-10 MED ORDER — PANTOPRAZOLE SODIUM 40 MG PO TBEC
40.0000 mg | DELAYED_RELEASE_TABLET | Freq: Two times a day (BID) | ORAL | 3 refills | Status: DC
  Filled 2022-03-10 – 2022-07-29 (×9): qty 60, 30d supply, fill #0

## 2022-03-15 ENCOUNTER — Encounter (HOSPITAL_COMMUNITY): Payer: Self-pay | Admitting: Pharmacist

## 2022-03-15 ENCOUNTER — Other Ambulatory Visit (HOSPITAL_COMMUNITY): Payer: Self-pay

## 2022-03-18 ENCOUNTER — Other Ambulatory Visit (HOSPITAL_COMMUNITY): Payer: Self-pay

## 2022-03-25 ENCOUNTER — Encounter: Payer: Self-pay | Admitting: Internal Medicine

## 2022-03-25 ENCOUNTER — Other Ambulatory Visit (HOSPITAL_COMMUNITY): Payer: Self-pay

## 2022-03-25 ENCOUNTER — Other Ambulatory Visit: Payer: Self-pay | Admitting: Internal Medicine

## 2022-03-25 MED ORDER — PROMETHAZINE HCL 25 MG PO TABS
25.0000 mg | ORAL_TABLET | Freq: Four times a day (QID) | ORAL | 1 refills | Status: DC | PRN
  Filled 2022-03-25: qty 120, 30d supply, fill #0
  Filled 2022-04-06 – 2022-05-15 (×5): qty 120, 30d supply, fill #1

## 2022-03-26 ENCOUNTER — Ambulatory Visit (HOSPITAL_COMMUNITY)
Admission: RE | Admit: 2022-03-26 | Discharge: 2022-03-26 | Disposition: A | Discharge status: Home / Self Care | Payer: Self-pay | Source: Ambulatory Visit | Attending: Internal Medicine | Admitting: Internal Medicine

## 2022-03-26 DIAGNOSIS — K7460 Unspecified cirrhosis of liver: Secondary | ICD-10-CM | POA: Insufficient documentation

## 2022-03-26 DIAGNOSIS — K7581 Nonalcoholic steatohepatitis (NASH): Secondary | ICD-10-CM | POA: Insufficient documentation

## 2022-03-26 DIAGNOSIS — R1084 Generalized abdominal pain: Secondary | ICD-10-CM | POA: Insufficient documentation

## 2022-03-26 MED ORDER — GADOBUTROL 1 MMOL/ML IV SOLN
10.0000 mL | Freq: Once | INTRAVENOUS | Status: AC | PRN
  Administered 2022-03-26: 10 mL via INTRAVENOUS

## 2022-03-28 ENCOUNTER — Other Ambulatory Visit (HOSPITAL_COMMUNITY): Payer: Self-pay

## 2022-04-02 ENCOUNTER — Ambulatory Visit: Payer: Medicaid Other | Admitting: Internal Medicine

## 2022-04-02 ENCOUNTER — Encounter: Payer: Self-pay | Admitting: Internal Medicine

## 2022-04-06 ENCOUNTER — Other Ambulatory Visit (HOSPITAL_COMMUNITY): Payer: Self-pay

## 2022-04-07 ENCOUNTER — Other Ambulatory Visit (HOSPITAL_COMMUNITY): Payer: Self-pay

## 2022-04-07 MED ORDER — XIFAXAN 550 MG PO TABS
550.0000 mg | ORAL_TABLET | Freq: Two times a day (BID) | ORAL | 3 refills | Status: AC
  Filled 2022-04-07 – 2023-02-26 (×5): qty 180, 90d supply, fill #0

## 2022-04-07 MED ORDER — GABAPENTIN 300 MG PO CAPS
300.0000 mg | ORAL_CAPSULE | Freq: Two times a day (BID) | ORAL | 1 refills | Status: DC
  Filled 2022-04-07 – 2022-04-25 (×2): qty 180, 90d supply, fill #0
  Filled 2022-05-15 – 2022-05-27 (×2): qty 180, 90d supply, fill #1

## 2022-04-08 ENCOUNTER — Other Ambulatory Visit (HOSPITAL_COMMUNITY): Payer: Self-pay

## 2022-04-11 ENCOUNTER — Telehealth: Payer: Self-pay | Admitting: Physician Assistant

## 2022-04-11 NOTE — Telephone Encounter (Signed)
Patient called requesting refill on PHENERGAN states she really needs it.

## 2022-04-11 NOTE — Telephone Encounter (Signed)
Too soon to refill -just refilled on 03/25/22 with one refill

## 2022-04-12 ENCOUNTER — Other Ambulatory Visit (HOSPITAL_COMMUNITY): Payer: Self-pay

## 2022-04-18 ENCOUNTER — Other Ambulatory Visit (HOSPITAL_COMMUNITY): Payer: Self-pay

## 2022-04-19 ENCOUNTER — Other Ambulatory Visit (HOSPITAL_COMMUNITY): Payer: Self-pay

## 2022-04-25 ENCOUNTER — Other Ambulatory Visit (HOSPITAL_COMMUNITY): Payer: Self-pay

## 2022-04-26 ENCOUNTER — Other Ambulatory Visit: Payer: Self-pay | Admitting: Internal Medicine

## 2022-04-28 ENCOUNTER — Other Ambulatory Visit (HOSPITAL_COMMUNITY): Payer: Self-pay

## 2022-05-01 ENCOUNTER — Encounter (HOSPITAL_COMMUNITY): Payer: Self-pay | Admitting: Internal Medicine

## 2022-05-02 ENCOUNTER — Other Ambulatory Visit (HOSPITAL_COMMUNITY): Payer: Self-pay

## 2022-05-02 NOTE — Progress Notes (Signed)
Attempted to obtain medical history via telephone, unable to reach at this time. HIPAA compliant voicemail message left requesting return call to pre surgical testing department. 

## 2022-05-04 NOTE — Progress Notes (Unsigned)
05/04/2022 Linda Becker [id number] Nov 12, 1990   Chief Complaint:  History of Present Illness: Linda Becker is a 31 year old female with a past medical history of obesity, s/p gastric sleeve surgery 06/2019, colon polyps, biopsy proven NASH cirrhosis with portal hypertension, esophageal varices with bleeding and thrombocytopenia. She is followed by Dr. [name].      Latest Ref Rng & Units 01/23/2022    2:30 PM 01/09/2022    3:26 PM 11/12/2021    3:45 PM  CBC  WBC 4.0 - 10.5 K/uL 4.0  3.9  5.0   Hemoglobin 12.0 - 15.0 g/dL 12.9  13.4  12.7   Hematocrit 36.0 - 46.0 % 39.5  40.9  37.5   Platelets 150.0 - 400.0 K/uL 74.0  81.0  77.0 Repeated and verified X2.        Latest Ref Rng & Units 01/09/2022    3:26 PM 11/12/2021    3:45 PM 08/09/2021   10:04 AM  CMP  Glucose 70 - 99 mg/dL 90  92  108   BUN 6 - 23 mg/dL 9  12  12    Creatinine 0.40 - 1.20 mg/dL 0.95  0.96  0.87   Sodium 135 - 145 mEq/L 142  141  137   Potassium 3.5 - 5.1 mEq/L 4.1  4.2  4.3   Chloride 96 - 112 mEq/L 111  111  105   CO2 19 - 32 mEq/L 23  23  28    Calcium 8.4 - 10.5 mg/dL 8.5  9.1  8.9   Total Protein 6.0 - 8.3 g/dL 6.5  6.2  6.5   Total Bilirubin 0.2 - 1.2 mg/dL 0.5  0.5  0.5   Alkaline Phos 39 - 117 U/L 62  62  83   AST 0 - 37 U/L 15  12  37   ALT 0 - 35 U/L 12  11  40     Abdominal MRI 03/26/2022: FINDINGS: Lower chest: No acute abnormality.   Hepatobiliary: No significant hepatic steatosis. Widening of the hepatic fissures with slight contour nodularity consistent with given diagnosis of cirrhosis. No arterially enhancing hepatic lesion. Gallbladder is unremarkable. No biliary ductal dilation.   Pancreas: No pancreatic ductal dilation or evidence of acute inflammation.   Spleen:  Splenomegaly measuring 18.6 cm in maximum axial dimension   Adrenals/Urinary Tract: Bilateral adrenal glands appear normal. No hydronephrosis. No solid enhancing renal mass.   Stomach/Bowel: Visualized  portions within the abdomen are unremarkable.   Vascular/Lymphatic: Normal caliber abdominal aorta. The portal, splenic and superior mesenteric veins are patent. Abdominal collateral vessels including tiny paraesophageal varices and a recannulized paraumbilical vein. No pathologically enlarged abdominal lymph nodes.   Other:  No significant abdominal free fluid.   Musculoskeletal: No suspicious osseous lesion.   IMPRESSION: 1. No acute abnormality identified in the abdomen. 2. Cirrhosis with sequela of portal hypertension including splenomegaly and abdominal collateral vessels. No ascites or arterially enhancing hepatic lesion.  Abdominal sono 01/30/2022: Gallbladder: No gallstones or wall thickening visualized. No sonographic Murphy sign noted by sonographer.   Common bile duct: Diameter: 4 mm   Liver: The liver demonstrates increased heterogeneous echotexture. No liver mass identified. Portal vein is patent on color Doppler imaging with normal direction of blood flow towards the liver.   IVC: No abnormality visualized.   Pancreas: Not visualized due to patient body habitus.   Spleen: The splenic length is 15.7 cm. Splenic volume is 623 cc consistent with splenomegaly.  Right Kidney: Length: 11.6 cm. Echogenicity within normal limits. No mass or hydronephrosis visualized.   Left Kidney: Length: 11.0 cm. Echogenicity within normal limits. No mass or hydronephrosis visualized.   Abdominal aorta: No aneurysm visualized.   Other findings: None.   IMPRESSION: 1. Heterogeneous increased echogenicity in the liver consistent with reported history of cirrhosis. No liver mass identified. 2. Pancreas not well visualized due to patient body habitus. 3. Splenomegaly with a volume of 623 cc. 4. No other abnormalities.    PAST GI PROCEDURES:  EGD 09/26/2020: - Grade I and small (< 5 mm) esophageal varices which flattened completely with insufflation. Not amenable to EVL  today. Evidence of scarring in the distal esophagus - Grade I and small (< 5 mm) esophageal varices which flIantpteantieedn tcompletely with insufflation. Not amenable to EVL today. Evidence of scarring in the distal esophagus from prior esophageal band ligation.. - 1-2 cm hiatal hernia. - A sleeve gastrectomy was found. - Striped erythematous and nodular mucosa in the antrum. Biopsied. - Nodular mucosa in the second portion of the duodenum. Biopsied. - Repeat upper endoscopy in 1 year for surveillance. A. DUODENUM, NODULE, BIOPSY:  - Ulcer with associated inflammation and reactive changes.  - No dysplasia or carcinoma.  B. STOMACH, NODULE, BIOPSY:  - Gastric mucosa with erosion and mild active inflammation.  - Warthin-Starry negative for Helicobacter pylori.  - No intestinal metaplasia, dysplasia or carcinoma.    EGD 01/27/2020: - No gross lesions in esophagus proximally. Grade II and grade III esophageal varices with stigmata of recent bleeding in distal esophagus. Completely eradicated. Banded. - Z-line irregular, 37 cm from the incisors. - Portal hypertensive gastropathy. Nodular mucosa in the gastric antrum. Previously biopsied and negative for HP. - No gross lesions in the duodenal bulb, in the first portion of the duodenum and in the second portion of the duodenum.   Colonoscopy 09/03/2018: - Two 2 to 3 mm polyps in the ascending colon, removed with a cold biopsy forceps. Resected and retrieved. - Two 3 to 4 mm polyps in the transverse colon, removed with a cold snare. Complete resection. Partial retrieval. - One 5 mm polyp in the rectum, removed with a cold snare. Resected and retrieved. - The examination was otherwise normal on direct and retroflexion views. - Biopsies were taken with a cold forceps from the right colon and left colon for evaluation of microscopic colitis. Colon, polyp(s), ascending x 2 - SESSILE SERRATED POLYP (X2 FRAGMENTS). - NO DYSPLASIA OR  MALIGNANCY. Colon, biopsy, random - BENIGN COLONIC MUCOSA. - NO ACTIVE INFLAMMATION OR EVIDENCE OF MICROSCOPIC COLITIS. - NO DYSPLASIA OR MALIGNANCY. Colon, polyp(s), transverse and rectal - HYPERPLASTIC POLYP. - BENIGN COLONIC MUCOSA WITH LYMPHOID AGGREGATE. - NO DYSPLASIA OR MALIGNANCY  Current Medications, Allergies, Past Medical History, Past Surgical History, Family History and Social History were reviewed in [employer] record.   Review of Systems:   Constitutional: Negative for fever, sweats, chills or weight loss.  Respiratory: Negative for shortness of breath.   Cardiovascular: Negative for chest pain, palpitations and leg swelling.  Gastrointestinal: See HPI.  Musculoskeletal: Negative for back pain or muscle aches.  Neurological: Negative for dizziness, headaches or paresthesias.    Physical Exam: There were no vitals taken for this visit. General: Well developed, w   ***female in no acute distress. Head: Normocephalic and atraumatic. Eyes: No scleral icterus. Conjunctiva pink . Ears: Normal auditory acuity. Mouth: Dentition intact. No ulcers or lesions.  Lungs: Clear throughout to auscultation. Heart:  Regular rate and rhythm, no murmur. Abdomen: Soft, nontender and nondistended. No masses or hepatomegaly. Normal bowel sounds x 4 quadrants.  Rectal: *** Musculoskeletal: Symmetrical with no gross deformities. Extremities: No edema. Neurological: Alert oriented x 4. No focal deficits.  Psychological: Alert and cooperative. Normal mood and affect  Assessment and Recommendations: ***

## 2022-05-05 ENCOUNTER — Encounter: Payer: Self-pay | Admitting: Nurse Practitioner

## 2022-05-05 ENCOUNTER — Other Ambulatory Visit (INDEPENDENT_AMBULATORY_CARE_PROVIDER_SITE_OTHER): Payer: Self-pay

## 2022-05-05 ENCOUNTER — Other Ambulatory Visit (HOSPITAL_COMMUNITY): Payer: Self-pay

## 2022-05-05 ENCOUNTER — Ambulatory Visit (INDEPENDENT_AMBULATORY_CARE_PROVIDER_SITE_OTHER): Payer: Self-pay | Admitting: Nurse Practitioner

## 2022-05-05 VITALS — BP 90/51 | HR 97 | Ht 66.0 in | Wt 246.5 lb

## 2022-05-05 DIAGNOSIS — R1010 Upper abdominal pain, unspecified: Secondary | ICD-10-CM

## 2022-05-05 DIAGNOSIS — R112 Nausea with vomiting, unspecified: Secondary | ICD-10-CM

## 2022-05-05 DIAGNOSIS — K7460 Unspecified cirrhosis of liver: Secondary | ICD-10-CM

## 2022-05-05 LAB — CBC WITH DIFFERENTIAL/PLATELET
Basophils Absolute: 0 10*3/uL (ref 0.0–0.1)
Basophils Relative: 0.5 % (ref 0.0–3.0)
Eosinophils Absolute: 0.2 10*3/uL (ref 0.0–0.7)
Eosinophils Relative: 5.7 % — ABNORMAL HIGH (ref 0.0–5.0)
HCT: 33.4 % — ABNORMAL LOW (ref 36.0–46.0)
Hemoglobin: 10.7 g/dL — ABNORMAL LOW (ref 12.0–15.0)
Lymphocytes Relative: 30.9 % (ref 12.0–46.0)
Lymphs Abs: 1.2 10*3/uL (ref 0.7–4.0)
MCHC: 32.1 g/dL (ref 30.0–36.0)
MCV: 88.5 fl (ref 78.0–100.0)
Monocytes Absolute: 0.3 10*3/uL (ref 0.1–1.0)
Monocytes Relative: 6.6 % (ref 3.0–12.0)
Neutro Abs: 2.1 10*3/uL (ref 1.4–7.7)
Neutrophils Relative %: 56.3 % (ref 43.0–77.0)
Platelets: 80 10*3/uL — ABNORMAL LOW (ref 150.0–400.0)
RBC: 3.77 Mil/uL — ABNORMAL LOW (ref 3.87–5.11)
RDW: 16.8 % — ABNORMAL HIGH (ref 11.5–15.5)
WBC: 3.8 10*3/uL — ABNORMAL LOW (ref 4.0–10.5)

## 2022-05-05 LAB — COMPREHENSIVE METABOLIC PANEL
ALT: 19 U/L (ref 0–35)
AST: 19 U/L (ref 0–37)
Albumin: 3.9 g/dL (ref 3.5–5.2)
Alkaline Phosphatase: 47 U/L (ref 39–117)
BUN: 13 mg/dL (ref 6–23)
CO2: 24 mEq/L (ref 19–32)
Calcium: 8.4 mg/dL (ref 8.4–10.5)
Chloride: 108 mEq/L (ref 96–112)
Creatinine, Ser: 0.68 mg/dL (ref 0.40–1.20)
GFR: 117.64 mL/min (ref >60.00)
Glucose, Bld: 93 mg/dL (ref 70–99)
Potassium: 4.7 mEq/L (ref 3.5–5.1)
Sodium: 138 mEq/L (ref 135–145)
Total Bilirubin: 0.4 mg/dL (ref 0.2–1.2)
Total Protein: 6.1 g/dL (ref 6.0–8.3)

## 2022-05-05 LAB — PROTIME-INR
INR: 1.2 ratio — ABNORMAL HIGH (ref 0.8–1.0)
Prothrombin Time: 12.6 s (ref 9.6–13.1)

## 2022-05-05 LAB — LIPASE: Lipase: 19 U/L (ref 11.0–59.0)

## 2022-05-05 NOTE — Patient Instructions (Signed)
Your provider has requested that you go to the basement level for lab work before leaving today. Press "B" on the elevator. The lab is located at the first door on the left as you exit the elevator.  FDguard- take 2 tablets by mouth twice daily for nausea and vomiting   Go to the ER if you have any further syncopal episodes.  Due to recent changes in healthcare laws, you may see the results of your imaging and laboratory studies on MyChart before your provider has had a chance to review them.  We understand that in some cases there may be results that are confusing or concerning to you. Not all laboratory results come back in the same time frame and the provider may be waiting for multiple results in order to interpret others.  Please give [place] 48 hours in order for your provider to thoroughly review all the results before contacting the office for clarification of your results.    Thank you for trusting me with your gastrointestinal care!   Carl Best, CRNP

## 2022-05-12 ENCOUNTER — Ambulatory Visit (HOSPITAL_COMMUNITY): Payer: Self-pay | Admitting: Anesthesiology

## 2022-05-12 ENCOUNTER — Ambulatory Visit (HOSPITAL_BASED_OUTPATIENT_CLINIC_OR_DEPARTMENT_OTHER): Payer: Self-pay | Admitting: Anesthesiology

## 2022-05-12 ENCOUNTER — Ambulatory Visit (HOSPITAL_COMMUNITY)
Admission: RE | Admit: 2022-05-12 | Discharge: 2022-05-12 | Disposition: A | Discharge status: Home / Self Care | Payer: Self-pay | Attending: Internal Medicine | Admitting: Internal Medicine

## 2022-05-12 ENCOUNTER — Other Ambulatory Visit: Payer: Self-pay

## 2022-05-12 ENCOUNTER — Encounter (HOSPITAL_COMMUNITY): Payer: Self-pay | Admitting: Internal Medicine

## 2022-05-12 ENCOUNTER — Encounter (HOSPITAL_COMMUNITY)
Admission: RE | Disposition: A | Discharge status: Home / Self Care | Payer: Self-pay | Source: Home / Self Care | Attending: Internal Medicine

## 2022-05-12 DIAGNOSIS — Z6839 Body mass index (BMI) 39.0-39.9, adult: Secondary | ICD-10-CM | POA: Insufficient documentation

## 2022-05-12 DIAGNOSIS — R112 Nausea with vomiting, unspecified: Secondary | ICD-10-CM | POA: Insufficient documentation

## 2022-05-12 DIAGNOSIS — K2980 Duodenitis without bleeding: Secondary | ICD-10-CM | POA: Insufficient documentation

## 2022-05-12 DIAGNOSIS — R1084 Generalized abdominal pain: Secondary | ICD-10-CM

## 2022-05-12 DIAGNOSIS — K7460 Unspecified cirrhosis of liver: Secondary | ICD-10-CM

## 2022-05-12 DIAGNOSIS — K2970 Gastritis, unspecified, without bleeding: Secondary | ICD-10-CM | POA: Insufficient documentation

## 2022-05-12 DIAGNOSIS — Z9884 Bariatric surgery status: Secondary | ICD-10-CM | POA: Insufficient documentation

## 2022-05-12 DIAGNOSIS — R1010 Upper abdominal pain, unspecified: Secondary | ICD-10-CM | POA: Insufficient documentation

## 2022-05-12 DIAGNOSIS — K449 Diaphragmatic hernia without obstruction or gangrene: Secondary | ICD-10-CM

## 2022-05-12 DIAGNOSIS — J45909 Unspecified asthma, uncomplicated: Secondary | ICD-10-CM | POA: Insufficient documentation

## 2022-05-12 DIAGNOSIS — K766 Portal hypertension: Secondary | ICD-10-CM | POA: Insufficient documentation

## 2022-05-12 DIAGNOSIS — I8500 Esophageal varices without bleeding: Secondary | ICD-10-CM

## 2022-05-12 DIAGNOSIS — K2100 Gastro-esophageal reflux disease with esophagitis, without bleeding: Secondary | ICD-10-CM | POA: Insufficient documentation

## 2022-05-12 DIAGNOSIS — K7469 Other cirrhosis of liver: Secondary | ICD-10-CM | POA: Insufficient documentation

## 2022-05-12 DIAGNOSIS — G8929 Other chronic pain: Secondary | ICD-10-CM | POA: Insufficient documentation

## 2022-05-12 DIAGNOSIS — K219 Gastro-esophageal reflux disease without esophagitis: Secondary | ICD-10-CM

## 2022-05-12 DIAGNOSIS — F319 Bipolar disorder, unspecified: Secondary | ICD-10-CM

## 2022-05-12 DIAGNOSIS — Z79899 Other long term (current) drug therapy: Secondary | ICD-10-CM | POA: Insufficient documentation

## 2022-05-12 HISTORY — PX: ESOPHAGOGASTRODUODENOSCOPY (EGD) WITH PROPOFOL: SHX5813

## 2022-05-12 HISTORY — PX: BIOPSY: SHX5522

## 2022-05-12 SURGERY — ESOPHAGOGASTRODUODENOSCOPY (EGD) WITH PROPOFOL
Anesthesia: Monitor Anesthesia Care

## 2022-05-12 MED ORDER — SODIUM CHLORIDE 0.9 % IV SOLN: INTRAVENOUS | Status: DC

## 2022-05-12 MED ORDER — PROPOFOL 10 MG/ML IV BOLUS
INTRAVENOUS | Status: DC | PRN
  Administered 2022-05-12: 40 mg via INTRAVENOUS
  Administered 2022-05-12 (×2): 30 mg via INTRAVENOUS
  Administered 2022-05-12: 60 mg via INTRAVENOUS

## 2022-05-12 MED ORDER — LIDOCAINE HCL (CARDIAC) PF 100 MG/5ML IV SOSY
PREFILLED_SYRINGE | INTRAVENOUS | Status: DC | PRN
  Administered 2022-05-12: 100 mg via INTRAVENOUS

## 2022-05-12 MED ORDER — LACTATED RINGERS IV SOLN: INTRAVENOUS | Status: DC

## 2022-05-12 MED ORDER — PROPOFOL 500 MG/50ML IV EMUL
INTRAVENOUS | Status: DC | PRN
  Administered 2022-05-12: 180 ug/kg/min via INTRAVENOUS

## 2022-05-12 SURGICAL SUPPLY — 15 items

## 2022-05-12 NOTE — Anesthesia Postprocedure Evaluation (Signed)
Anesthesia Post Note  Patient: [name]  Procedure(s) Performed: ESOPHAGOGASTRODUODENOSCOPY (EGD) WITH PROPOFOL BIOPSY     Patient location during evaluation: PACU Anesthesia Type: MAC Level of consciousness: awake and alert Pain management: pain level controlled Vital Signs Assessment: post-procedure vital signs reviewed and stable Respiratory status: spontaneous breathing, nonlabored ventilation and respiratory function stable Cardiovascular status: stable and blood pressure returned to baseline Anesthetic complications: no   No notable events documented.  Last Vitals:  Vitals:   05/12/22 0930 05/12/22 0940  BP: (!) 94/49 130/73  Pulse: 86 100  Resp: 15 15  Temp:    SpO2: 100% 100%    Last Pain:  Vitals:   05/12/22 0940  TempSrc:   PainSc: 0-No pain                 Audry Pili

## 2022-05-12 NOTE — Anesthesia Preprocedure Evaluation (Addendum)
Anesthesia Evaluation  Patient identified by MRN, date of birth, ID band Patient awake    Reviewed: Allergy & Precautions, NPO status , Patient's Chart, lab work & pertinent test results  History of Anesthesia Complications Negative for: history of anesthetic complications  Airway Mallampati: II  TM Distance: >3 FB Neck ROM: Full    Dental  (+) Chipped, Dental Advisory Given,    Pulmonary asthma    Pulmonary exam normal        Cardiovascular negative cardio ROS Normal cardiovascular exam     Neuro/Psych  Headaches PSYCHIATRIC DISORDERS   Bipolar Disorder      GI/Hepatic hiatal hernia,GERD  Medicated,,(+) Cirrhosis   Esophageal Varices     NASH   S/p gastric bypass    Endo/Other  Hypothyroidism  Morbid obesity  Renal/GU negative Renal ROS     Musculoskeletal negative musculoskeletal ROS (+)    Abdominal   Peds  Hematology  (+) Blood dyscrasia, anemia  INR 1.2 Plt 80k    Anesthesia Other Findings   Reproductive/Obstetrics                             Anesthesia Physical Anesthesia Plan  ASA: 3  Anesthesia Plan: MAC   Post-op Pain Management:    Induction:   PONV Risk Score and Plan: 2 and Propofol infusion and Treatment may vary due to age or medical condition  Airway Management Planned: Nasal Cannula and Natural Airway  Additional Equipment: None  Intra-op Plan:   Post-operative Plan:   Informed Consent: I have reviewed the patients History and Physical, chart, labs and discussed the procedure including the risks, benefits and alternatives for the proposed anesthesia with the patient or authorized representative who has indicated his/her understanding and acceptance.       Plan Discussed with: CRNA and Anesthesiologist  Anesthesia Plan Comments:         Anesthesia Quick Evaluation

## 2022-05-12 NOTE — Progress Notes (Signed)
Addendum: Reviewed and agree with assessment and management plan. EGD scheduled at [facility] today.  Variceal surveillance, hx of cirrhosis, chronic abd pain, n/v.  The nature of the procedure, as well as the risks, benefits, and alternatives were carefully and thoroughly reviewed with the patient. Ample time for discussion and questions allowed. The patient understood, was satisfied, and agreed to proceed.   Alphonso Gregson, [name], MD

## 2022-05-12 NOTE — Op Note (Signed)
[facility] Patient Name: Linda Becker Procedure Date: 05/12/2022 MRN: [id number] Attending MD: Jerene Bears , MD, [id number] Date of Birth: [birth year] CSN: [id number] Age: 31 Admit Type: Outpatient Procedure:                Upper GI endoscopy Indications:              Chronic upper abdominal pain, Gastro-esophageal                            reflux disease, Nausea with vomiting, hx of MASH                            cirrhosis with portal HTN and hx of esophageal                            varices Providers:                [name]. [name], MD, Mikey College, RN, Benetta Spar, Technician Referring MD:              Medicines:                Monitored Anesthesia Care Complications:            No immediate complications. Estimated Blood Loss:     Estimated blood loss was minimal. Procedure:                Pre-Anesthesia Assessment:                           - Prior to the procedure, a History and Physical                            was performed, and patient medications and                            allergies were reviewed. The patient's tolerance of                            previous anesthesia was also reviewed. The risks                            and benefits of the procedure and the sedation                            options and risks were discussed with the patient.                            All questions were answered, and informed consent                            was obtained. Prior Anticoagulants: The patient has  taken no anticoagulant or antiplatelet agents. ASA                            Grade Assessment: III - A patient with severe                            systemic disease. After reviewing the risks and                            benefits, the patient was deemed in satisfactory                            condition to undergo the procedure.                           After obtaining informed  consent, the endoscope was                            passed under direct vision. Throughout the                            procedure, the patient's blood pressure, pulse, and                            oxygen saturations were monitored continuously. The                            [id number]-H190 ([id number]) Olympus endoscope was introduced                            through the mouth, and advanced to the second part                            of duodenum. The upper GI endoscopy was                            accomplished without difficulty. The patient                            tolerated the procedure well. Scope In: Scope Out: Findings:      Mild esophagitis with no bleeding was found 35 to 38 cm from the       incisors. Mucosal edema, white specks seen. Biopsies were taken with a       cold forceps for histology.      There is no endoscopic evidence of varices in the entire esophagus or       stomach.      A 2 cm hiatal hernia was present.      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 This was       characterized by healthy appearing mucosa.      Moderate inflammation characterized by congestion (edema), erosions and       erythema was found in the gastric antrum and in the prepyloric region of       the stomach. Biopsies were taken  with a cold forceps for histology and       Helicobacter pylori testing.      The examined duodenum was normal. Biopsies were taken with a cold       forceps for histology. Impression:               - Mild distal esophagitis with no bleeding.                            Biopsied.                           - 2 cm hiatal hernia.                           - A sleeve gastrectomy was found, characterized by                            healthy appearing mucosa.                           - Gastritis (erosive) in antrum/pre-pyloric                            stomach. Biopsied.                           - Normal examined duodenum. Biopsied.                            - No varices or changes of portal HTN. Moderate Sedation:      N/A Recommendation:           - Patient has a contact number available for                            emergencies. The signs and symptoms of potential                            delayed complications were discussed with the                            patient. Return to normal activities tomorrow.                            Written discharge instructions were provided to the                            patient.                           - Resume previous diet.                           - Continue present medications.                           - Await pathology results. Further recommendations  later this week once pathology results reviewed.                           - Checking ferritin + IBC panel, B12 today.                           - Repeat upper endoscopy in 1 year for surveillance. Procedure Code(s):        --- Professional ---                           [phone], Esophagogastroduodenoscopy, flexible,                            transoral; with biopsy, single or multiple Diagnosis Code(s):        --- Professional ---                           K20.90, Esophagitis, unspecified without bleeding                           K44.9, Diaphragmatic hernia without obstruction or                            gangrene                           K29.70, Gastritis, unspecified, without bleeding                           R10.10, Upper abdominal pain, unspecified                           K21.9, Gastro-esophageal reflux disease without                            esophagitis                           R11.2, Nausea with vomiting, unspecified CPT copyright 2022 American Medical Association. All rights reserved. The codes documented in this report are preliminary and upon coder review may  be revised to meet current compliance requirements. Jerene Bears, MD 05/12/2022 9:37:11 AM This report has been  signed electronically. Number of Addenda: 0

## 2022-05-12 NOTE — Transfer of Care (Signed)
Immediate Anesthesia Transfer of Care Note  Patient: [name]  Procedure(s) Performed: ESOPHAGOGASTRODUODENOSCOPY (EGD) WITH PROPOFOL BIOPSY  Patient Location: PACU and Endoscopy Unit  Anesthesia Type:MAC  Level of Consciousness: awake, alert , and oriented  Airway & Oxygen Therapy: Patient connected to face mask oxygen  Post-op Assessment: Report given to RN and Post -op Vital signs reviewed and stable  Post vital signs: Reviewed and stable  Last Vitals:  Vitals Value Taken Time  BP    Temp    Pulse    Resp 19 05/12/22 0923  SpO2    Vitals shown include unvalidated device data.  Last Pain:  Vitals:   05/12/22 0840  TempSrc: Temporal  PainSc: 0-No pain         Complications: No notable events documented.

## 2022-05-12 NOTE — Discharge Instructions (Signed)
YOU HAD AN ENDOSCOPIC PROCEDURE TODAY: Refer to the procedure report and other information in the discharge instructions given to you for any specific questions about what was found during the examination. If this information does not answer your questions, please call  office at [phone] to clarify.  ° °YOU SHOULD EXPECT: Some feelings of bloating in the abdomen. Passage of more gas than usual. Walking can help get rid of the air that was put into your GI tract during the procedure and reduce the bloating.  ° °DIET: Your first meal following the procedure should be a light meal and then it is ok to progress to your normal diet. A half-sandwich or bowl of soup is an example of a good first meal. Heavy or fried foods are harder to digest and may make you feel nauseous or bloated. Drink plenty of fluids but you should avoid alcoholic beverages for 24 hours. ° °ACTIVITY: Your care partner should take you home directly after the procedure. You should plan to take it easy, moving slowly for the rest of the day. You can resume normal activity the day after the procedure however YOU SHOULD NOT DRIVE, use power tools, machinery or perform tasks that involve climbing or major physical exertion for 24 hours (because of the sedation medicines used during the test).  ° °SYMPTOMS TO REPORT IMMEDIATELY: °A gastroenterologist can be reached at any hour. Please call [phone]  for any of the following symptoms:  ° °Following upper endoscopy (EGD, EUS, ERCP, esophageal dilation) °Vomiting of blood or coffee ground material  °New, significant abdominal pain  °New, significant chest pain or pain under the shoulder blades  °Painful or persistently difficult swallowing  °New shortness of breath  °Black, tarry-looking or red, bloody stools ° °FOLLOW UP:  °If any biopsies were taken you will be contacted by phone or by letter within the next 1-3 weeks. Call [phone]  if you have not heard about the biopsies in 3 weeks.    °Please also call with any specific questions about appointments or follow up tests. ° °

## 2022-05-13 ENCOUNTER — Other Ambulatory Visit (INDEPENDENT_AMBULATORY_CARE_PROVIDER_SITE_OTHER): Payer: Self-pay

## 2022-05-13 DIAGNOSIS — R1010 Upper abdominal pain, unspecified: Secondary | ICD-10-CM

## 2022-05-13 DIAGNOSIS — K7581 Nonalcoholic steatohepatitis (NASH): Secondary | ICD-10-CM

## 2022-05-13 DIAGNOSIS — K7460 Unspecified cirrhosis of liver: Secondary | ICD-10-CM

## 2022-05-13 DIAGNOSIS — R112 Nausea with vomiting, unspecified: Secondary | ICD-10-CM

## 2022-05-13 LAB — CBC WITH DIFFERENTIAL/PLATELET
Basophils Absolute: 0 10*3/uL (ref 0.0–0.1)
Basophils Relative: 0.5 % (ref 0.0–3.0)
Eosinophils Absolute: 0.1 10*3/uL (ref 0.0–0.7)
Eosinophils Relative: 3.5 % (ref 0.0–5.0)
HCT: 36.9 % (ref 36.0–46.0)
Hemoglobin: 12 g/dL (ref 12.0–15.0)
Lymphocytes Relative: 22.4 % (ref 12.0–46.0)
Lymphs Abs: 0.9 10*3/uL (ref 0.7–4.0)
MCHC: 32.4 g/dL (ref 30.0–36.0)
MCV: 87 fl (ref 78.0–100.0)
Monocytes Absolute: 0.3 10*3/uL (ref 0.1–1.0)
Monocytes Relative: 7 % (ref 3.0–12.0)
Neutro Abs: 2.6 10*3/uL (ref 1.4–7.7)
Neutrophils Relative %: 66.6 % (ref 43.0–77.0)
Platelets: 87 10*3/uL — ABNORMAL LOW (ref 150.0–400.0)
RBC: 4.25 Mil/uL (ref 3.87–5.11)
RDW: 16.7 % — ABNORMAL HIGH (ref 11.5–15.5)
WBC: 3.9 10*3/uL — ABNORMAL LOW (ref 4.0–10.5)

## 2022-05-13 LAB — SURGICAL PATHOLOGY

## 2022-05-13 LAB — VITAMIN B12: Vitamin B-12: 533 pg/mL (ref 211–911)

## 2022-05-14 ENCOUNTER — Encounter (HOSPITAL_COMMUNITY): Payer: Self-pay | Admitting: Internal Medicine

## 2022-05-14 ENCOUNTER — Encounter: Payer: Self-pay | Admitting: Internal Medicine

## 2022-05-14 ENCOUNTER — Other Ambulatory Visit: Payer: Self-pay

## 2022-05-14 ENCOUNTER — Other Ambulatory Visit (HOSPITAL_COMMUNITY): Payer: Self-pay

## 2022-05-14 DIAGNOSIS — K200 Eosinophilic esophagitis: Secondary | ICD-10-CM | POA: Insufficient documentation

## 2022-05-14 LAB — IRON,TIBC AND FERRITIN PANEL
%SAT: 6 % (calc) — ABNORMAL LOW (ref 16–45)
Ferritin: 6 ng/mL — ABNORMAL LOW (ref 16–154)
Iron: 27 ug/dL — ABNORMAL LOW (ref 40–190)
TIBC: 449 mcg/dL (calc) (ref 250–450)

## 2022-05-14 MED ORDER — SCOPOLAMINE 1 MG/3DAYS TD PT72
1.0000 | MEDICATED_PATCH | TRANSDERMAL | 12 refills | Status: DC
  Filled 2022-05-14: qty 10, 30d supply, fill #0

## 2022-05-14 MED ORDER — FLUTICASONE PROPIONATE HFA 220 MCG/ACT IN AERO
INHALATION_SPRAY | RESPIRATORY_TRACT | 2 refills | Status: DC
  Filled 2022-05-14: qty 12, 30d supply, fill #0

## 2022-05-15 ENCOUNTER — Other Ambulatory Visit (HOSPITAL_COMMUNITY): Payer: Self-pay

## 2022-05-15 ENCOUNTER — Other Ambulatory Visit: Payer: Self-pay | Admitting: Internal Medicine

## 2022-05-16 ENCOUNTER — Other Ambulatory Visit (HOSPITAL_COMMUNITY): Payer: Self-pay

## 2022-05-16 MED ORDER — SUCRALFATE 1 GM/10ML PO SUSP
1.0000 g | Freq: Three times a day (TID) | ORAL | 1 refills | Status: DC
  Filled 2022-05-16 – 2023-01-10 (×5): qty 420, 11d supply, fill #0

## 2022-05-17 ENCOUNTER — Other Ambulatory Visit (HOSPITAL_COMMUNITY): Payer: Self-pay

## 2022-05-19 ENCOUNTER — Other Ambulatory Visit (HOSPITAL_COMMUNITY): Payer: Self-pay

## 2022-05-19 MED ORDER — CEFDINIR 300 MG PO CAPS
600.0000 mg | ORAL_CAPSULE | Freq: Every day | ORAL | 0 refills | Status: DC
  Filled 2022-05-19: qty 20, 10d supply, fill #0

## 2022-05-20 ENCOUNTER — Other Ambulatory Visit: Payer: Self-pay

## 2022-05-20 ENCOUNTER — Encounter: Payer: Self-pay | Admitting: Internal Medicine

## 2022-05-20 DIAGNOSIS — D509 Iron deficiency anemia, unspecified: Secondary | ICD-10-CM

## 2022-05-22 ENCOUNTER — Other Ambulatory Visit (HOSPITAL_COMMUNITY): Payer: Self-pay

## 2022-05-22 ENCOUNTER — Telehealth: Payer: Self-pay | Admitting: Hematology and Oncology

## 2022-05-22 NOTE — Telephone Encounter (Signed)
Contacted patient to scheduled appointments. Left message with appointment details and a call back number if patient had any questions or could not accommodate the time we provided.   

## 2022-05-23 ENCOUNTER — Other Ambulatory Visit (HOSPITAL_COMMUNITY): Payer: Self-pay

## 2022-05-27 ENCOUNTER — Other Ambulatory Visit (HOSPITAL_COMMUNITY): Payer: Self-pay

## 2022-05-27 ENCOUNTER — Other Ambulatory Visit: Payer: Self-pay | Admitting: Internal Medicine

## 2022-05-27 MED ORDER — PROMETHAZINE HCL 25 MG PO TABS
25.0000 mg | ORAL_TABLET | Freq: Four times a day (QID) | ORAL | 0 refills | Status: DC | PRN
  Filled 2022-05-27 (×2): qty 60, 15d supply, fill #0

## 2022-05-28 ENCOUNTER — Other Ambulatory Visit: Payer: Self-pay

## 2022-05-28 ENCOUNTER — Encounter: Payer: Self-pay | Admitting: Allergy and Immunology

## 2022-05-28 ENCOUNTER — Other Ambulatory Visit (HOSPITAL_COMMUNITY): Payer: Self-pay

## 2022-05-29 ENCOUNTER — Telehealth: Payer: Medicaid Other | Admitting: Emergency Medicine

## 2022-05-29 ENCOUNTER — Ambulatory Visit: Payer: Medicaid Other | Admitting: Family Medicine

## 2022-05-29 DIAGNOSIS — B3731 Acute candidiasis of vulva and vagina: Secondary | ICD-10-CM

## 2022-05-29 MED ORDER — FLUCONAZOLE 150 MG PO TABS: 150.0000 mg | ORAL_TABLET | ORAL | 0 refills | Status: AC

## 2022-05-29 NOTE — Progress Notes (Signed)
E-Visit for Vaginal Symptoms  We are sorry that you are not feeling well. Here is how we plan to help! Based on what you shared with me it looks like you: May have a yeast vaginosis  Vaginosis is an inflammation of the vagina that can result in discharge, itching and pain. The cause is usually a change in the normal balance of vaginal bacteria or an infection. Vaginosis can also result from reduced estrogen levels after menopause.  The most common causes of vaginosis are:   Bacterial vaginosis which results from an overgrowth of one on several organisms that are normally present in your vagina.   Yeast infections which are caused by a naturally occurring fungus called candida.   Vaginal atrophy (atrophic vaginosis) which results from the thinning of the vagina from reduced estrogen levels after menopause.   Trichomoniasis which is caused by a parasite and is commonly transmitted by sexual intercourse.  Factors that increase your risk of developing vaginosis include: Medications, such as antibiotics and steroids Uncontrolled diabetes Use of hygiene products such as bubble bath, vaginal spray or vaginal deodorant Douching Wearing damp or tight-fitting clothing Using an intrauterine device (IUD) for birth control Hormonal changes, such as those associated with pregnancy, birth control pills or menopause Sexual activity Having a sexually transmitted infection  Your treatment plan is A single Diflucan (fluconazole) 118m tablet once.  I have electronically sent this prescription into the pharmacy that you have chosen. I have sent in 2 doses for you.   Be sure to take all of the medication as directed. Stop taking any medication if you develop a rash, tongue swelling or shortness of breath. Mothers who are breast feeding should consider pumping and discarding their breast milk while on these antibiotics. However, there is no consensus that infant exposure at these doses would be harmful.   Remember that medication creams can weaken latex condoms. .Marland Kitchen  HOME CARE:  Good hygiene may prevent some types of vaginosis from recurring and may relieve some symptoms:  Avoid baths, hot tubs and whirlpool spas. Rinse soap from your outer genital area after a shower, and dry the area well to prevent irritation. Don't use scented or harsh soaps, such as those with deodorant or antibacterial action. Avoid irritants. These include scented tampons and pads. Wipe from front to back after using the toilet. Doing so avoids spreading fecal bacteria to your vagina.  Other things that may help prevent vaginosis include:  Don't douche. Your vagina doesn't require cleansing other than normal bathing. Repetitive douching disrupts the normal organisms that reside in the vagina and can actually increase your risk of vaginal infection. Douching won't clear up a vaginal infection. Use a latex condom. Both female and female latex condoms may help you avoid infections spread by sexual contact. Wear cotton underwear. Also wear pantyhose with a cotton crotch. If you feel comfortable without it, skip wearing underwear to bed. Yeast thrives in mCampbell SoupYour symptoms should improve in the next day or two.  GET HELP RIGHT AWAY IF:  You have pain in your lower abdomen ( pelvic area or over your ovaries) You develop nausea or vomiting You develop a fever Your discharge changes or worsens You have persistent pain with intercourse You develop shortness of breath, a rapid pulse, or you faint.  These symptoms could be signs of problems or infections that need to be evaluated by a medical provider now.  MAKE SURE YOU   Understand these instructions. Will watch your condition.  Will get help right away if you are not doing well or get worse.  Thank you for choosing an e-visit.  Your e-visit answers were reviewed by a board certified advanced clinical practitioner to complete your personal care plan.  Depending upon the condition, your plan could have included both over the counter or prescription medications.  Please review your pharmacy choice. Make sure the pharmacy is open so you can pick up prescription now. If there is a problem, you may contact your provider through [employer] and have the prescription routed to another pharmacy.  Your safety is important to [place]. If you have drug allergies check your prescription carefully.   For the next 24 hours you can use MyChart to ask questions about today's visit, request a non-urgent call back, or ask for a work or school excuse. You will get an email in the next two days asking about your experience. I hope that your e-visit has been valuable and will speed your recovery.  I have spent 5 minutes in review of e-visit questionnaire, review and updating patient chart, medical decision making and response to patient.   Willeen Cass, PhD, FNP-BC

## 2022-05-29 NOTE — Progress Notes (Deleted)
   [place] [address] Dept: [phone]  FOLLOW UP NOTE  Patient ID: Linda Becker, female    DOB: Sep 06, 1990  Age: 31 y.o. MRN: [id number] Date of Office Visit: 05/29/2022  Assessment  Chief Complaint: No chief complaint on file.  HPI Linda Becker is a 31 year old female who presents to the clinic for evaluation of acute symptoms including rash.  She was last seen in this clinic on 08/13/2021 by Dr. Neldon Mc for evaluation of asthma, allergic rhinitis, urticaria, and reflux.   Drug Allergies:  Allergies  Allergen Reactions   Progesterone Rash    Was in a form of birth control.   Penicillins Rash    Did it involve swelling of the face/tongue/throat, SOB, or low BP? No Did it involve sudden or severe rash/hives, skin peeling, or any reaction on the inside of your mouth or nose? Yes Did you need to seek medical attention at a hospital or doctor's office? No When did it last happen?      9 + months If all above answers are "NO", may proceed with cephalosporin use.      Physical Exam: There were no vitals taken for this visit.   Physical Exam  Diagnostics:    Assessment and Plan: No diagnosis found.  No orders of the defined types were placed in this encounter.   There are no Patient Instructions on file for this visit.  No follow-ups on file.    Thank you for the opportunity to care for this patient.  Please do not hesitate to contact me with questions.  Linda Morgan, FNP [facility]

## 2022-05-31 ENCOUNTER — Other Ambulatory Visit (HOSPITAL_COMMUNITY): Payer: Self-pay

## 2022-06-02 ENCOUNTER — Encounter: Payer: Self-pay | Admitting: Internal Medicine

## 2022-06-03 ENCOUNTER — Telehealth: Payer: Self-pay | Admitting: Internal Medicine

## 2022-06-03 NOTE — Telephone Encounter (Signed)
Patient is calling in regards to long-term disability papers that need to be sent to [facility]. Fax # [phone]

## 2022-06-03 NOTE — Progress Notes (Signed)
Patient Care Team: Shon Baton, MD as PCP - General (Internal Medicine) Neldon Mc, Donnamarie Poag, MD as Consulting Physician (Allergy and Immunology) Adrian Prows, MD as Consulting Physician (Cardiology) Elayne Snare, MD as Consulting Physician (Endocrinology) Pyrtle, [name], MD as Consulting Physician (Gastroenterology) Neldon Mc Donnamarie Poag, MD as Consulting Physician (Allergy and Immunology)  DIAGNOSIS: No diagnosis found.  SUMMARY OF ONCOLOGIC HISTORY: Oncology History   No history exists.    CHIEF COMPLIANT:  Follow-up of thrombocytopenia and splenomegaly   INTERVAL HISTORY: Linda Becker is a 31 y.o. with above-mentioned history of thrombocytopenia and splenomegaly. Labs on 01/29/20 showed platelets 49, on 01/28/20 platelets 53, and on 01/27/20 platelets 63. CT abdomen/pelvis on 01/27/20 showed cirrhotic morphology of the liver with sequela of portal hypertension including splenomegaly, esophageal varices, and multiple upper abdominal collateral vessels. She presents to the clinic today for follow-up.     ALLERGIES:  is allergic to progesterone and penicillins.  MEDICATIONS:  Current Outpatient Medications  Medication Sig Dispense Refill   fluticasone (FLOVENT HFA) 220 MCG/ACT inhaler Swallow 2 puffs twice a day as directed by physician.  **Do not inhale.** 12 g 2   scopolamine (TRANSDERM-SCOP) 1 MG/3DAYS Place 1 patch (1.5 mg total) onto the skin every 3 (three) days. 10 patch 12   albuterol (PROVENTIL) (2.5 MG/3ML) 0.083% nebulizer solution Inhale 1 ampule by nebulization every 4 (four) hours as needed for wheezing or shortness of breath. 90 mL 1   albuterol (VENTOLIN HFA) 108 (90 Base) MCG/ACT inhaler Inhale 2 puffs into the lungs every 6 (six) hours as needed for wheezing or shortness of breath. 6.7 g 1   Albuterol Sulfate, sensor, (PROAIR DIGIHALER) 108 (90 Base) MCG/ACT AEPB Inhale 2 puffs into the lungs every 4 hours as needed (For coughing and wheezing). 1 each 3   ARIPiprazole (ABILIFY)  5 MG tablet Take 1 tablet (5 mg total) by mouth daily. 30 tablet 1   Ascorbic Acid (VITAMIN C) 1000 MG tablet Take 2,000 mg by mouth 2 (two) times daily.     budesonide (RHINOCORT AQUA) 32 MCG/ACT nasal spray Place 1 spray into both nostrils in the morning and at bedtime. 8.43 mL 2   CALCIUM PO Take 1 tablet by mouth 2 (two) times daily.     cefdinir (OMNICEF) 300 MG capsule Take 2 capsules (600 mg total) by mouth daily. 20 capsule 0   Cetirizine HCl 10 MG CAPS Take 10 mg by mouth 2 (two) times daily.      dicyclomine (BENTYL) 10 MG capsule Take 2 capsules (20 mg total) by mouth 3 (three) times daily as needed for spasms. 90 capsule 0   diphenhydrAMINE (BENADRYL) 25 MG tablet Take 50 mg by mouth at bedtime as needed for allergies or sleep.     escitalopram (LEXAPRO) 20 MG tablet Take 1 tablet (20 mg total) by mouth daily. 90 tablet 1   famotidine (PEPCID) 40 MG tablet Take 1 tablet by mouth 2 times daily. 60 tablet 2   fluconazole (DIFLUCAN) 150 MG tablet Take 1 tablet (150 mg total) by mouth once a week for 2 doses. 2 tablet 0   gabapentin (NEURONTIN) 300 MG capsule Take 1 capsule (300 mg total) by mouth 2 (two) times daily. 180 capsule 1   metoCLOPramide (REGLAN) 5 MG tablet Take 1 tablet (5 mg total) by mouth every 8 (eight) hours as needed for nausea or vomiting. 30 tablet 0   nadolol (CORGARD) 20 MG tablet Take 1 tablet (20 mg total) by mouth  2 (two) times daily. 180 tablet 0   Olopatadine HCl 0.2 % SOLN Place 1 drop into both eyes daily as needed 7.5 mL 1   pantoprazole (PROTONIX) 40 MG tablet Take 1 tablet (40 mg total) by mouth 2 (two) times daily before a meal. 60 tablet 3   Prenatal Vit-Fe Fumarate-FA (MULTIVITAMIN-PRENATAL) 27-0.8 MG TABS tablet Take 1 tablet by mouth 2 (two) times daily.     promethazine (PHENERGAN) 25 MG tablet Take 1 tablet by mouth every 6 hours as needed for vomiting or nausea. 60 tablet 0   sucralfate (CARAFATE) 1 GM/10ML suspension Take 10 mLs by mouth 4 times  daily -  with meals and at bedtime. 420 mL 1   ursodiol (ACTIGALL) 500 MG tablet Take 1 tablet (500 mg total) by mouth 3 (three) times daily 90 tablet 11   verapamil (CALAN-SR) 180 MG CR tablet Take 1 tablet (180 mg total) by mouth at bedtime. 30 tablet 3   VITAMIN D PO Take 1 capsule by mouth 2 (two) times daily.     VITAMIN E PO Take 1 capsule by mouth daily.     XIFAXAN 550 MG TABS tablet Take 1 tablet (550 mg total) by mouth 2 (two) times daily. 180 tablet 3   No current facility-administered medications for this visit.   Facility-Administered Medications Ordered in Other Visits  Medication Dose Route Frequency Provider Last Rate Last Admin   0.9 %  sodium chloride infusion   Intravenous Once PRN Pyrtle, [name], MD       albuterol (VENTOLIN HFA) 108 (90 Base) MCG/ACT inhaler 2 puff  2 puff Inhalation Once PRN Pyrtle, [name], MD       diphenhydrAMINE (BENADRYL) injection 50 mg  50 mg Intravenous Once PRN Pyrtle, [name], MD       EPINEPHrine (EPI-PEN) injection 0.3 mg  0.3 mg Intramuscular Once PRN Pyrtle, [name], MD       famotidine (PEPCID) IVPB 20 mg premix  20 mg Intravenous Once PRN Pyrtle, [name], MD       iron sucrose (VENOFER) 200 mg in sodium chloride 0.9 % 100 mL IVPB  200 mg Intravenous Once Pyrtle, [name], MD       methylPREDNISolone sodium succinate (SOLU-MEDROL) 125 mg/2 mL injection 125 mg  125 mg Intravenous Once PRN Pyrtle, [name], MD       promethazine (PHENERGAN) suppository 12.5-25 mg  12.5-25 mg Rectal Q4H PRN Clovis Riley, MD        PHYSICAL EXAMINATION: ECOG PERFORMANCE STATUS: {CHL ONC ECOG BP:[id number]}  There were no vitals filed for this visit. There were no vitals filed for this visit.  BREAST:*** No palpable masses or nodules in either right or left breasts. No palpable axillary supraclavicular or infraclavicular adenopathy no breast tenderness or nipple discharge. (exam performed in the presence of a chaperone)  LABORATORY DATA:  I have reviewed the data  as listed    Latest Ref Rng & Units 05/05/2022    2:40 PM 01/09/2022    3:26 PM 11/12/2021    3:45 PM  CMP  Glucose 70 - 99 mg/dL 93  90  92   BUN 6 - 23 mg/dL 13  9  12    Creatinine 0.40 - 1.20 mg/dL 0.68  0.95  0.96   Sodium 135 - 145 mEq/L 138  142  141   Potassium 3.5 - 5.1 mEq/L 4.7  4.1  4.2   Chloride 96 - 112 mEq/L 108  111  111   CO2 19 - 32 mEq/L 24  23  23    Calcium 8.4 - 10.5 mg/dL 8.4  8.5  9.1   Total Protein 6.0 - 8.3 g/dL 6.1  6.5  6.2   Total Bilirubin 0.2 - 1.2 mg/dL 0.4  0.5  0.5   Alkaline Phos 39 - 117 U/L 47  62  62   AST 0 - 37 U/L 19  15  12    ALT 0 - 35 U/L 19  12  11      Lab Results  Component Value Date   WBC 3.9 (L) 05/13/2022   HGB 12.0 05/13/2022   HCT 36.9 05/13/2022   MCV 87.0 05/13/2022   PLT 87.0 (L) 05/13/2022   NEUTROABS 2.6 05/13/2022    ASSESSMENT & PLAN:  No problem-specific Assessment & Plan notes found for this encounter.    No orders of the defined types were placed in this encounter.  The patient has a good understanding of the overall plan. she agrees with it. she will call with any problems that may develop before the next visit here. Total time spent: 30 mins including face to face time and time spent for planning, charting and co-ordination of care   Suzzette Righter, Linda 06/03/22   I, Donnamae Muilenburg,am acting as a Education administrator for Dr.Vin[employer]  ***

## 2022-06-03 NOTE — Telephone Encounter (Signed)
Letter faxed through epic per pt request.

## 2022-06-04 ENCOUNTER — Other Ambulatory Visit: Payer: Self-pay

## 2022-06-04 ENCOUNTER — Inpatient Hospital Stay: Payer: Medicaid Other | Attending: Hematology and Oncology | Admitting: Hematology and Oncology

## 2022-06-04 VITALS — BP 133/80 | HR 94 | Temp 98.1°F | Resp 17 | Wt 241.8 lb

## 2022-06-04 DIAGNOSIS — D696 Thrombocytopenia, unspecified: Secondary | ICD-10-CM | POA: Diagnosis present

## 2022-06-04 NOTE — Assessment & Plan Note (Signed)
Due to cirrhosis of the liver and splenomegaly  Lab review: 02/04/2018: Platelets 135 07/27/2018: Platelet count 102 01/28/2020: Platelet count 53 02/07/2020: Platelet count 63 05/13/2022: Platelets 87  Previous work-up was negative hepatitis B and C.  Hepatitis A showed seropositivity   Treatment plan: Watchful monitoring  Patient informed that she wants to get pregnant.  If she does get pregnant then we will have to watch even more closely and consider treating her with platelet transfusions at the time of delivery. Continue for watchful monitoring.   Return to clinic in 6 months with labs and follow-up.

## 2022-06-04 NOTE — Telephone Encounter (Signed)
No nsaids BID PPI Can try pill form of carafate 1 g TIDAC and HS Has she tried amitriptylline?

## 2022-06-05 ENCOUNTER — Other Ambulatory Visit: Payer: Self-pay | Admitting: Internal Medicine

## 2022-06-05 ENCOUNTER — Other Ambulatory Visit (HOSPITAL_COMMUNITY): Payer: Self-pay

## 2022-06-05 DIAGNOSIS — M549 Dorsalgia, unspecified: Secondary | ICD-10-CM

## 2022-06-05 DIAGNOSIS — R319 Hematuria, unspecified: Secondary | ICD-10-CM

## 2022-06-05 MED ORDER — NITROFURANTOIN MONOHYD MACRO 100 MG PO CAPS
100.0000 mg | ORAL_CAPSULE | Freq: Two times a day (BID) | ORAL | 0 refills | Status: DC
  Filled 2022-06-05: qty 14, 7d supply, fill #0

## 2022-06-06 ENCOUNTER — Ambulatory Visit
Admission: RE | Admit: 2022-06-06 | Discharge: 2022-06-06 | Disposition: A | Discharge status: Home / Self Care | Payer: Medicaid Other | Source: Ambulatory Visit | Attending: Internal Medicine | Admitting: Internal Medicine

## 2022-06-06 DIAGNOSIS — M549 Dorsalgia, unspecified: Secondary | ICD-10-CM

## 2022-06-06 DIAGNOSIS — R319 Hematuria, unspecified: Secondary | ICD-10-CM

## 2022-06-10 NOTE — Telephone Encounter (Signed)
Would have her try amitriptyline 25 mg nightly for chronic abdominal pain This can make her sleepy so should only be taken right before bedtime

## 2022-06-11 MED ORDER — AMITRIPTYLINE HCL 25 MG PO TABS: 25.0000 mg | ORAL_TABLET | Freq: Every day | ORAL | 0 refills | Status: DC

## 2022-06-12 ENCOUNTER — Other Ambulatory Visit (HOSPITAL_COMMUNITY): Payer: Self-pay

## 2022-06-12 ENCOUNTER — Other Ambulatory Visit: Payer: Self-pay | Admitting: Internal Medicine

## 2022-06-12 MED ORDER — GABAPENTIN 300 MG PO CAPS
300.0000 mg | ORAL_CAPSULE | Freq: Two times a day (BID) | ORAL | 1 refills | Status: DC
  Filled 2022-06-12 – 2022-08-27 (×17): qty 180, 90d supply, fill #0
  Filled 2022-11-17: qty 180, 90d supply, fill #1
  Filled 2022-11-17: qty 164, 82d supply, fill #1
  Filled 2022-11-17: qty 16, 8d supply, fill #1
  Filled 2022-11-26 – 2022-12-12 (×2): qty 180, 90d supply, fill #1

## 2022-06-13 ENCOUNTER — Encounter: Payer: Self-pay | Admitting: Allergy and Immunology

## 2022-06-13 ENCOUNTER — Ambulatory Visit (INDEPENDENT_AMBULATORY_CARE_PROVIDER_SITE_OTHER): Payer: Medicaid Other | Admitting: Family

## 2022-06-13 ENCOUNTER — Other Ambulatory Visit (HOSPITAL_COMMUNITY): Payer: Self-pay

## 2022-06-13 ENCOUNTER — Other Ambulatory Visit: Payer: Self-pay

## 2022-06-13 ENCOUNTER — Encounter: Payer: Self-pay | Admitting: Family

## 2022-06-13 VITALS — BP 106/80 | HR 105 | Temp 98.3°F | Resp 16 | Ht 65.0 in | Wt 235.4 lb

## 2022-06-13 DIAGNOSIS — R21 Rash and other nonspecific skin eruption: Secondary | ICD-10-CM | POA: Diagnosis not present

## 2022-06-13 DIAGNOSIS — L299 Pruritus, unspecified: Secondary | ICD-10-CM

## 2022-06-13 MED ORDER — CLOTRIMAZOLE-BETAMETHASONE 1-0.05 % EX CREA
TOPICAL_CREAM | CUTANEOUS | 0 refills | Status: DC
  Filled 2022-06-13: qty 45, 7d supply, fill #0
  Filled 2022-07-10: qty 45, 30d supply, fill #0

## 2022-06-13 NOTE — Telephone Encounter (Signed)
Called patient - DOB verified - advised she will need to be seen by a provider - offered same day visit today.  Patient stated she was currently in [place] but could be available in the afternoon.  Schedule Same Day appt w/Chrissie @ 2 pm.  Patient verbalized understanding, no further questions.  Forwarding message to provider as well.

## 2022-06-13 NOTE — Progress Notes (Signed)
[place] [id number] Dept: [phone]  FOLLOW UP NOTE  Patient ID: Linda Becker, female    DOB: [birth year]  Age: 31 y.o. MRN: [id number] Date of Office Visit: 06/13/2022  Assessment  Chief Complaint: Rash and Pain (Pt c/o rash starting under breast abdomen thighs legs and knees. )  HPI Linda Becker is a 31 year old female who presents today for an acute visit of rash.  She was last seen on August 13, 2021 by Dr. Neldon Mc for severe persistent asthma, perennial allergic rhinitis, gastroesophageal reflux disease, and pruritic disorder.  She reports that she has an endoscopy every 6 months due to having esophageal varices.  She reports her last endoscopy was normal.  She continues to follow-up with [facility].  She did see neurology yesterday due to having tingling and numbness in her left arm on Christmas Day.  She does mention that they are trying to move up her MRI.  She reports last night around 8 PM she took off her clothes to get a shower and did not see anything abnormal.  She did not use any new products while in the shower.  She mentions she is a creature of habit.  She got out of the shower and was walking around in a T-shirt.  She noticed that under her breasts started burning.  She mentions that she is not a stranger to chafing.  She tried Monistat cream like she has done a thousand times.  This soothed the burning for half a second.  She then decided to lie down and the burning got worse.  She denies any itching. she then began having same sensation of burning under her belly, around her bellybutton, her thighs, then her face.  She does mention it looks vastly different now because it is spreading.  She does have pictures on her phone that her husband took.  Around 8:00 last night she took Benadryl and tried hydrocortisone cream.  They did not really help.  The area on her left leg became an open sore.  She denies using any new products or new foods.  She  was not sick prior to this happening. She mentions that her nephew had RSV a few weeks ago.  No one else has this rash.  She denies fever or chills, but mentions that she is always cold due to different blood issues.  She denies any insect bites or been exposed to poison ivy.  She denies any out of the ordinary joint pain.  When asked about bruising she does mention that she has noticed a bruise on her left thigh.  She also mentions that she does bruise easily due to thrombocytopenia and being anemic.  She does continue to follow with hematology.  She is concerned because in 2019 she delivered a stillborn child and had a systemic infection where the rash extended to every part of her body.  She was given ampicillin.  The difference with this rash then versus the one today is that the rash in 2019 was super itchy and that this rash is not itchy.  She is not taking Dupixent injections due to not having insurance.  She does mention that just got insurance this month. She denies any concomitant  cardiorespiratory symptoms.  She also mentions that on December 23 she developed sudden flank pain that was different from her usual back pain.  She was told she had a UTI and was given antibiotic that she thinks was Macrobid.  She  also had a tooth infection maybe a month ago due to a crack of her teeth.  She was given cefdinir and went away.  She mentions she has not had a temperature since.  She is also taking Diflucan due to having a yeast infection from the antibiotics.   Drug Allergies:  Allergies  Allergen Reactions   Progesterone Rash    Was in a form of birth control.   Penicillins Rash    Did it involve swelling of the face/tongue/throat, SOB, or low BP? No Did it involve sudden or severe rash/hives, skin peeling, or any reaction on the inside of your mouth or nose? Yes Did you need to seek medical attention at a hospital or doctor's office? No When did it last happen?      9 + months If all above answers  are "NO", may proceed with cephalosporin use.      Review of Systems: Review of Systems  Constitutional:  Negative for chills and fever.  HENT:         Denies rhinorrhea, nasal congestion, and post nasal drip  Eyes:        Denies itchy watery eyes  Respiratory:  Negative for cough, shortness of breath and wheezing.   Cardiovascular:  Negative for chest pain and palpitations.  Gastrointestinal:        Reports nothing out of ordinary for reflux  Genitourinary:  Positive for frequency.  Skin:  Positive for rash. Negative for itching.       Reports that rash burns,but does not itch  Neurological:  Positive for headaches.       Reports massive headache today  Endo/Heme/Allergies:  Positive for environmental allergies.     Physical Exam: BP 106/80   Pulse (!) 105   Temp 98.3 F (36.8 C)   Resp 16   Ht 5' 5"  (1.651 m)   Wt 235 lb 6.4 oz (106.8 kg)   SpO2 98%   BMI 39.17 kg/m    Physical Exam Chaperone present: Dr. Posey Pronto was present for a portion of the visit to view rashes.  Constitutional:      Appearance: Normal appearance.  HENT:     Head: Normocephalic and atraumatic.     Comments: Pharynx normal. Eyes normal. Ears normal. Nose normal    Right Ear: Tympanic membrane, ear canal and external ear normal.     Left Ear: Tympanic membrane, ear canal and external ear normal.     Nose: Nose normal.     Mouth/Throat:     Mouth: Mucous membranes are moist.     Pharynx: Oropharynx is clear.  Eyes:     Conjunctiva/sclera: Conjunctivae normal.  Cardiovascular:     Rate and Rhythm: Regular rhythm. Tachycardia present.     Heart sounds: Normal heart sounds.  Pulmonary:     Effort: Pulmonary effort is normal.     Breath sounds: Normal breath sounds.     Comments: Lungs clear to auscultation Musculoskeletal:     Cervical back: Neck supple.  Skin:    General: Skin is warm.     Comments: Erythematous areas noted around umbilicus, under breast, bilateral inner thighs, and  lower abdomen. No oozing or bleeding. Darkened areas noted in left upper thigh. See photos below  Neurological:     Mental Status: She is alert and oriented to person, place, and time.  Psychiatric:        Mood and Affect: Mood normal.        Behavior: Behavior normal.  Thought Content: Thought content normal.        Judgment: Judgment normal.     Diagnostics:        Assessment and Plan: 1. Rash and nonspecific skin eruption   2. Pruritic disorder     Meds ordered this encounter  Medications   clotrimazole-betamethasone (LOTRISONE) cream    Sig: Apply sparingly twice a day to red areas for 7 days and then stop    Dispense:  45 g    Refill:  0    Patient Instructions   1.  Treat and prevent inflammation:  A. Breztri - 2 inhalations 2 times per day B. OTC Budesonide - 1 spray each nostril 2 time per day C. Consider restarting Dupilumab injection  every 2 weeks since you now have insurance. Can discuss with Dr. Neldon Mc at your next office visit  2.  Treat and prevent reflux/LPR:   A. Consolidate all caffeine consumption   B. Famotidine 40 mg - 1 tablet twice a day C. Pantoprazole 40 mg - 1 tablet 2 times per day  3.  If needed:  A. Albuterol - 2 inhalations or nebulization every 4-6 hours  B. Stop cetirizine C. Mucinex DM - 2 tablets 2 times per day  4. Rash- Discussed that I was not sure the cause of the rash. Discussed possible fungal cause due to locations of rash. Start Allegra 180 mg taking one tablet twice a day. You can increase to 2 tablets twice a day if needed Start clotrimazole/betamethasone cream using 1 application sparingly twice a day for 7 days Recommend contacting [facility] if the rash continues also  If you develop a fever of symptoms worsen recommend going to the Emergency Room 4. Return to clinic in 2-4 weeks with Dr. Neldon Mc or earlier if problem   Return in about 2 weeks (around 06/27/2022), or if symptoms worsen or fail to  improve.    Thank you for the opportunity to care for this patient.  Please do not hesitate to contact me with questions.  Althea Charon, FNP [facility]

## 2022-06-13 NOTE — Patient Instructions (Addendum)
  1.  Treat and prevent inflammation:  A. Breztri - 2 inhalations 2 times per day B. OTC Budesonide - 1 spray each nostril 2 time per day C. Consider restarting Dupilumab injection  every 2 weeks since you now have insurance. Can discuss with Dr. Neldon Mc at your next office visit  2.  Treat and prevent reflux/LPR:   A. Consolidate all caffeine consumption   B. Famotidine 40 mg - 1 tablet twice a day C. Pantoprazole 40 mg - 1 tablet 2 times per day  3.  If needed:  A. Albuterol - 2 inhalations or nebulization every 4-6 hours  B. Stop cetirizine C. Mucinex DM - 2 tablets 2 times per day  4. Rash- Discussed that I was not sure the cause of the rash. Discussed possible fungal cause due to locations of rash. Start Allegra 180 mg taking one tablet twice a day. You can increase to 2 tablets twice a day if needed Start clotrimazole/betamethasone cream using 1 application sparingly twice a day for 7 days Recommend contacting [facility] if the rash continues also  If you develop a fever of symptoms worsen recommend going to the Emergency Room 4. Return to clinic in 2-4 weeks with Dr. Neldon Mc or earlier if problem

## 2022-06-14 ENCOUNTER — Other Ambulatory Visit (HOSPITAL_COMMUNITY): Payer: Self-pay

## 2022-06-17 ENCOUNTER — Other Ambulatory Visit (HOSPITAL_COMMUNITY): Payer: Self-pay

## 2022-06-18 ENCOUNTER — Ambulatory Visit: Payer: Medicaid Other | Admitting: Cardiology

## 2022-06-18 ENCOUNTER — Other Ambulatory Visit (HOSPITAL_COMMUNITY): Payer: Self-pay

## 2022-06-18 NOTE — Progress Notes (Deleted)
Primary Physician/Referring:  Shon Baton, MD  Patient ID: Linda Becker, female    DOB: 1991-01-18, 32 y.o.   MRN: [id number]  No chief complaint on file.  HPI:    Linda Becker  is a 32 y.o. Caucasian female with history of migraines, liver cirrhosis secondary to NASH/portal hypertension, splenomegaly and thrombocytopenia secondary to cirrhosis of the liver, hypothyroidism, and chronic palpitations, morbid obesity SP gastric sleeve procedure in January 2021, unfortunately gained all her weight back.  Presently being evaluated at [facility] for liver transplant.  ***  Patient reports that since starting verapamil she has had less frequent episodes of palpitations, however continues to report elevated heart rate with maximum heart rate 110 bpm.  She is feeling well overall, however she has not followed up with PCP for management of thyroid dysfunction.  Patient denies chest pain, dizziness, syncope, near syncope.  She is presently taking nadolol for portal hypertension. Notably patient is not pregnant and does not plan to become pregnant, she take prenatal vitamins for her hair and nails.   Past Medical History:  Diagnosis Date   Allergic rhinitis    remote hx of allergy shots   Asthma    since childhood  on controller meds extrinsic dr Carmelina Peal   Bipolar II disorder (HCC)    Cirrhosis ([place])    Esophageal varices ([place])    Family history of adverse reaction to anesthesia    mom and sister difficult to awaken per patient    Gallbladder sludge    Gastropathy    GERD (gastroesophageal reflux disease)    on nexium  for  long term sx since childhood   H/O miscarriage, not currently pregnant    [redacted] weeks  march 2016   Hepatic steatosis    Hiatal hernia    Hypothyroidism    Migraine    Murmur    pt reports MVP   Portal hypertension (HCC)    Sessile colonic polyp    Splenomegaly    Steatohepatitis    Syncope    under eval ? cause   Tachycardia     episodes with near syncope eval dr Nadyne Coombes  on no meds dced LABA   Thrombocytopenia ([place])    Upper GI bleed    Past Surgical History:  Procedure Laterality Date   BIOPSY  09/03/2018   Procedure: BIOPSY;  Surgeon: Jerene Bears, MD;  Location: [facility];  Service: Gastroenterology;;   BIOPSY  09/26/2020   Procedure: BIOPSY;  Surgeon: Jerene Bears, MD;  Location: [facility];  Service: Gastroenterology;;   BIOPSY  05/12/2022   Procedure: BIOPSY;  Surgeon: Jerene Bears, MD;  Location: [facility];  Service: Gastroenterology;;   broken right femur  2008   rod placed   COLONOSCOPY WITH PROPOFOL N/A 09/03/2018   Procedure: COLONOSCOPY WITH PROPOFOL;  Surgeon: Jerene Bears, MD;  Location: [facility];  Service: Gastroenterology;  Laterality: N/A;   DILATION AND CURETTAGE OF UTERUS     ESOPHAGOGASTRODUODENOSCOPY (EGD) WITH PROPOFOL N/A 09/03/2018   Procedure: ESOPHAGOGASTRODUODENOSCOPY (EGD) WITH PROPOFOL;  Surgeon: Jerene Bears, MD;  [facility];  Service: Gastroenterology;  Laterality: N/A;   ESOPHAGOGASTRODUODENOSCOPY (EGD) WITH PROPOFOL N/A 01/27/2020   Procedure: ESOPHAGOGASTRODUODENOSCOPY (EGD) WITH PROPOFOL;  Surgeon: Rush Landmark Telford Nab., MD;  Location: [place];  Service: Gastroenterology;  Laterality: N/A;   ESOPHAGOGASTRODUODENOSCOPY (EGD) WITH PROPOFOL N/A 03/12/2020   Procedure: ESOPHAGOGASTRODUODENOSCOPY (EGD) WITH PROPOFOL;  Surgeon: Jerene Bears, MD;  [facility];  Service: Gastroenterology;  Laterality: N/A;   ESOPHAGOGASTRODUODENOSCOPY (EGD) WITH PROPOFOL N/A 09/26/2020   Procedure: ESOPHAGOGASTRODUODENOSCOPY (EGD) WITH PROPOFOL;  Surgeon: Jerene Bears, MD;  [facility];  Service: Gastroenterology;  Laterality: N/A;   ESOPHAGOGASTRODUODENOSCOPY (EGD) WITH PROPOFOL N/A 04/29/2021   Procedure: ESOPHAGOGASTRODUODENOSCOPY (EGD) WITH PROPOFOL;  Surgeon: Jerene Bears, MD;  [facility];  Service: Gastroenterology;  Laterality: N/A;    ESOPHAGOGASTRODUODENOSCOPY (EGD) WITH PROPOFOL N/A 05/12/2022   Procedure: ESOPHAGOGASTRODUODENOSCOPY (EGD) WITH PROPOFOL;  Surgeon: Jerene Bears, MD;  [facility];  Service: Gastroenterology;  Laterality: N/A;   GASTRIC VARICES BANDING  01/27/2020   Procedure: GASTRIC VARICES BANDING;  Surgeon: Rush Landmark Telford Nab., MD;  Location: [place];  Service: Gastroenterology;;   HOT HEMOSTASIS N/A 04/29/2021   Procedure: HOT HEMOSTASIS (ARGON PLASMA COAGULATION/BICAP);  Surgeon: Jerene Bears, MD;  Location: [facility];  Service: Gastroenterology;  Laterality: N/A;   IR TRANSCATHETER BX  01/19/2019   IR US GUIDE VASC ACCESS RIGHT  01/19/2019   IR VENOGRAM HEPATIC W HEMODYNAMIC EVALUATION  01/19/2019   LAPAROSCOPIC GASTRIC SLEEVE RESECTION N/A 06/28/2019   Procedure: LAPAROSCOPIC GASTRIC SLEEVE RESECTION, Upper Endo, ERAS Pathway;  Surgeon: Clovis Riley, MD;  Location: [facility];  Service: General;  Laterality: N/A;   OB ultrasound N/A 12/01/2017   see report   POLYPECTOMY  09/03/2018   Procedure: POLYPECTOMY;  Surgeon: Jerene Bears, MD;  Location: [facility];  Service: Gastroenterology;;   TONSILLECTOMY  2006   WISDOM TOOTH EXTRACTION       Social History   Tobacco Use   Smoking status: Never   Smokeless tobacco: Never  Substance Use Topics   Alcohol use: No    Alcohol/week: 0.0 standard drinks of alcohol   Marital Status: Married   ROS  Review of Systems  Constitutional: Negative for malaise/fatigue and weight gain.  Cardiovascular:  Positive for palpitations. Negative for chest pain, claudication, leg swelling, near-syncope, orthopnea, paroxysmal nocturnal dyspnea and syncope.  Respiratory:  Positive for shortness of breath.   Neurological:  Negative for dizziness.    Objective  There were no vitals taken for this visit.     06/13/2022    2:34 PM 06/04/2022   10:56 AM 05/12/2022    9:40 AM  Vitals with BMI  Height 5' 5"     Weight 235 lbs 6 oz 241 lbs 13  oz   BMI 69.79    Systolic 480 165 537  Diastolic 80 80 73  Pulse 482 94 100   No data found.   Physical Exam Vitals reviewed.  Constitutional:      Appearance: She is obese.  Cardiovascular:     Rate and Rhythm: Normal rate and regular rhythm.     Pulses: Intact distal pulses.     Heart sounds: S1 normal and S2 normal. No murmur heard.    No gallop.  Pulmonary:     Effort: Pulmonary effort is normal. No respiratory distress.     Breath sounds: No wheezing, rhonchi or rales.  Musculoskeletal:     Right lower leg: No edema.     Left lower leg: No edema.  Neurological:     Mental Status: She is alert.      Laboratory examination:   Recent Labs    11/12/21 1545 01/09/22 1526 05/05/22 1440  NA 141 142 138  K 4.2 4.1 4.7  CL 111 111 108  CO2 23 23 24   GLUCOSE 92 90 93  BUN 12 9 13  CREATININE 0.96 0.95 0.68  CALCIUM 9.1 8.5 8.4       Latest Ref Rng & Units 05/05/2022    2:40 PM 01/09/2022    3:26 PM 11/12/2021    3:45 PM  CMP  Glucose 70 - 99 mg/dL 93  90  92   BUN 6 - 23 mg/dL 13  9  12    Creatinine 0.40 - 1.20 mg/dL 0.68  0.95  0.96   Sodium 135 - 145 mEq/L 138  142  141   Potassium 3.5 - 5.1 mEq/L 4.7  4.1  4.2   Chloride 96 - 112 mEq/L 108  111  111   CO2 19 - 32 mEq/L 24  23  23    Calcium 8.4 - 10.5 mg/dL 8.4  8.5  9.1   Total Protein 6.0 - 8.3 g/dL 6.1  6.5  6.2   Total Bilirubin 0.2 - 1.2 mg/dL 0.4  0.5  0.5   Alkaline Phos 39 - 117 U/L 47  62  62   AST 0 - 37 U/L 19  15  12    ALT 0 - 35 U/L 19  12  11        Latest Ref Rng & Units 05/13/2022   12:24 PM 05/05/2022    2:40 PM 01/23/2022    2:30 PM  CBC  WBC 4.0 - 10.5 K/uL 3.9  3.8  4.0   Hemoglobin 12.0 - 15.0 g/dL 12.0  10.7  12.9   Hematocrit 36.0 - 46.0 % 36.9  33.4  39.5   Platelets 150.0 - 400.0 K/uL 87.0  80.0  74.0    Lab Results  Component Value Date   CHOL 184 03/11/2019   HDL 41.80 03/11/2019   LDLCALC 122 (H) 03/11/2019   TRIG 99.0 03/11/2019   CHOLHDL 4 03/11/2019       HEMOGLOBIN A1C Lab Results  Component Value Date   HGBA1C 4.3 (L) 06/03/2021   TSH Recent Labs    01/09/22 1526  TSH 2.48    External labs:   Labs 04/04/2022: Pro time mildly elevated at 13.5.  Hb 11.7/HCT 36.1, platelets 91.  Sodium 139, potassium 4.5, BUN 11, creatinine 1.1.  LFTs normal.  Vitamin D 27.  Labs 10/18/2021:  Serum glucose 84 mg, BUN 10, creatinine 0.7, EGFR 99 mL, potassium 3.9, LFTs normal.  Hb 12.5/HCT 35.4, platelets 73.  Vitamin D20.8. Allergies   Allergies  Allergen Reactions   Progesterone Rash    Was in a form of birth control.   Penicillins Rash    Did it involve swelling of the face/tongue/throat, SOB, or low BP? No Did it involve sudden or severe rash/hives, skin peeling, or any reaction on the inside of your mouth or nose? Yes Did you need to seek medical attention at a hospital or doctor's office? No When did it last happen?      9 + months If all above answers are "NO", may proceed with cephalosporin use.      Final Medications at End of Visit     Current Outpatient Medications:    albuterol (PROVENTIL) (2.5 MG/3ML) 0.083% nebulizer solution, Inhale 1 ampule by nebulization every 4 (four) hours as needed for wheezing or shortness of breath., Disp: 90 mL, Rfl: 1   albuterol (VENTOLIN HFA) 108 (90 Base) MCG/ACT inhaler, Inhale 2 puffs into the lungs every 6 (six) hours as needed for wheezing or shortness of breath., Disp: 6.7 g, Rfl: 1   Albuterol Sulfate, sensor, (PROAIR DIGIHALER) 108 (90 Base)  MCG/ACT AEPB, Inhale 2 puffs into the lungs every 4 hours as needed (For coughing and wheezing). (Patient not taking: Reported on 06/13/2022), Disp: 1 each, Rfl: 3   amitriptyline (ELAVIL) 25 MG tablet, Take 1 tablet (25 mg total) by mouth at bedtime. For chronic abdominal pain (Patient not taking: Reported on 06/13/2022), Disp: 30 tablet, Rfl: 0   Ascorbic Acid (VITAMIN C) 1000 MG tablet, Take 2,000 mg by mouth 2 (two) times daily., Disp: , Rfl:     budesonide (RHINOCORT AQUA) 32 MCG/ACT nasal spray, Place 1 spray into both nostrils in the morning and at bedtime. (Patient not taking: Reported on 06/13/2022), Disp: 8.43 mL, Rfl: 2   CALCIUM PO, Take 1 tablet by mouth 2 (two) times daily., Disp: , Rfl:    Cetirizine HCl 10 MG CAPS, Take 10 mg by mouth 2 (two) times daily. , Disp: , Rfl:    clotrimazole-betamethasone (LOTRISONE) cream, Apply sparingly twice a day to red areas for 7 days and then stop, Disp: 45 g, Rfl: 0   dicyclomine (BENTYL) 10 MG capsule, Take 2 capsules (20 mg total) by mouth 3 (three) times daily as needed for spasms. (Patient not taking: Reported on 06/13/2022), Disp: 90 capsule, Rfl: 0   diphenhydrAMINE (BENADRYL) 25 MG tablet, Take 50 mg by mouth at bedtime as needed for allergies or sleep., Disp: , Rfl:    famotidine (PEPCID) 40 MG tablet, Take 1 tablet by mouth 2 times daily., Disp: 60 tablet, Rfl: 2   gabapentin (NEURONTIN) 300 MG capsule, Take 1 capsule (300 mg total) by mouth 2 (two) times daily., Disp: 180 capsule, Rfl: 1   metoCLOPramide (REGLAN) 5 MG tablet, Take 1 tablet (5 mg total) by mouth every 8 (eight) hours as needed for nausea or vomiting. (Patient not taking: Reported on 06/13/2022), Disp: 30 tablet, Rfl: 0   nadolol (CORGARD) 20 MG tablet, Take 1 tablet (20 mg total) by mouth 2 (two) times daily., Disp: 180 tablet, Rfl: 0   nitrofurantoin, macrocrystal-monohydrate, (MACROBID) 100 MG capsule, Take 1 capsule (100 mg total) by mouth every 12 (twelve) hours with food for 7 days, Disp: 14 capsule, Rfl: 0   pantoprazole (PROTONIX) 40 MG tablet, Take 1 tablet (40 mg total) by mouth 2 (two) times daily before a meal., Disp: 60 tablet, Rfl: 3   Prenatal Vit-Fe Fumarate-FA (MULTIVITAMIN-PRENATAL) 27-0.8 MG TABS tablet, Take 1 tablet by mouth 2 (two) times daily., Disp: , Rfl:    promethazine (PHENERGAN) 25 MG tablet, Take 1 tablet by mouth every 6 hours as needed for vomiting or nausea., Disp: 60 tablet, Rfl: 0    scopolamine (TRANSDERM-SCOP) 1 MG/3DAYS, Place 1 patch (1.5 mg total) onto the skin every 3 (three) days. (Patient not taking: Reported on 06/13/2022), Disp: 10 patch, Rfl: 12   sucralfate (CARAFATE) 1 GM/10ML suspension, Take 10 mLs by mouth 4 times daily -  with meals and at bedtime., Disp: 420 mL, Rfl: 1   ursodiol (ACTIGALL) 500 MG tablet, Take 1 tablet (500 mg total) by mouth 3 (three) times daily, Disp: 90 tablet, Rfl: 11   verapamil (CALAN-SR) 180 MG CR tablet, Take 1 tablet (180 mg total) by mouth at bedtime., Disp: 30 tablet, Rfl: 3   VITAMIN D PO, Take 1 capsule by mouth 2 (two) times daily., Disp: , Rfl:    XIFAXAN 550 MG TABS tablet, Take 1 tablet (550 mg total) by mouth 2 (two) times daily., Disp: 180 tablet, Rfl: 3 No current facility-administered medications for this visit.  Facility-Administered Medications Ordered in Other Visits:    0.9 %  sodium chloride infusion, , Intravenous, Once PRN, Pyrtle, [name], MD   albuterol (VENTOLIN HFA) 108 (90 Base) MCG/ACT inhaler 2 puff, 2 puff, Inhalation, Once PRN, Pyrtle, [name], MD   diphenhydrAMINE (BENADRYL) injection 50 mg, 50 mg, Intravenous, Once PRN, Pyrtle, [name], MD   EPINEPHrine (EPI-PEN) injection 0.3 mg, 0.3 mg, Intramuscular, Once PRN, Pyrtle, [name], MD   famotidine (PEPCID) IVPB 20 mg premix, 20 mg, Intravenous, Once PRN, Pyrtle, [name], MD   iron sucrose (VENOFER) 200 mg in sodium chloride 0.9 % 100 mL IVPB, 200 mg, Intravenous, Once, Pyrtle, [name], MD   methylPREDNISolone sodium succinate (SOLU-MEDROL) 125 mg/2 mL injection 125 mg, 125 mg, Intravenous, Once PRN, Pyrtle, [name], MD   [DISCONTINUED] promethazine (PHENERGAN) tablet 12.5-25 mg, 12.5-25 mg, Oral, Q4H PRN **OR** promethazine (PHENERGAN) suppository 12.5-25 mg, 12.5-25 mg, Rectal, Q4H PRN, Clovis Riley, MD  Radiology:   No results found.  Cardiac Studies:   Echocardiogram 07/21/2017: Left ventricle cavity is normal in size. Mild concentric hypertrophy of the  left ventricle. Normal global wall motion. Visual EF is 60-65%. Normal diastolic filling pattern. No significant valvular abnormality No evidence of pulmonary hypertension. No significant change compared to prior study dated 04/12/2015.   Event Monitor 30 days 07/07/2017: NSR. Symptoms of fatigue and palpitations: NSR. No other significant arrhythmias noted.  PCV ECHOCARDIOGRAM COMPLETE 04/04/2021 Left ventricle cavity is normal in size. Mild concentric hypertrophy of the left ventricle. Normal global wall motion. Normal LV systolic function with EF 60%. Doppler evidence of grade I (impaired) diastolic dysfunction, normal LAP. Mild (Grade I) mitral regurgitation. Mild tricuspid regurgitation. No evidence of pulmonary hypertension. Compared to previous study in 2019, mild MR, mild TR are new.   EKG:   03/26/2021: Sinus rhythm at a rate of 99 bpm.  Right axis.  LVH.  Nonspecific T wave abnormality.  03/17/2019: Normal sinus rhythm at 88 bpm, normal axis, no evidence of ischemia.   Assessment     ICD-10-CM   1. Palpitations  R00.2     2. Tachycardia  R00.0     3. Morbid obesity ([place])  E66.01        There are no discontinued medications.   No orders of the defined types were placed in this encounter.   Recommendations:   Linda Becker is a 32 y.o. Caucasian female with history of migraines, liver cirrhosis secondary to NASH/portal hypertension, splenomegaly and thrombocytopenia secondary to cirrhosis of the liver, hypothyroidism, and chronic palpitations, morbid obesity SP gastric sleeve procedure in January 2021, unfortunately gained all her weight back.  Presently being evaluated at [facility] for liver transplant.  ***     Adrian Prows, MD, [facility] 06/18/2022, 9:42 AM Office: [phone] Fax: [phone] Pager: [phone]

## 2022-06-21 ENCOUNTER — Other Ambulatory Visit (HOSPITAL_COMMUNITY): Payer: Self-pay

## 2022-06-23 ENCOUNTER — Other Ambulatory Visit (HOSPITAL_COMMUNITY): Payer: Self-pay

## 2022-06-24 ENCOUNTER — Other Ambulatory Visit (HOSPITAL_COMMUNITY): Payer: Self-pay

## 2022-06-24 ENCOUNTER — Other Ambulatory Visit: Payer: Self-pay | Admitting: Internal Medicine

## 2022-06-27 ENCOUNTER — Other Ambulatory Visit: Payer: Self-pay | Admitting: Allergy and Immunology

## 2022-06-27 ENCOUNTER — Other Ambulatory Visit: Payer: Self-pay | Admitting: Internal Medicine

## 2022-06-27 ENCOUNTER — Other Ambulatory Visit: Payer: Self-pay

## 2022-06-27 ENCOUNTER — Other Ambulatory Visit (HOSPITAL_COMMUNITY): Payer: Self-pay

## 2022-06-27 MED ORDER — ALBUTEROL SULFATE HFA 108 (90 BASE) MCG/ACT IN AERS
2.0000 | INHALATION_SPRAY | Freq: Four times a day (QID) | RESPIRATORY_TRACT | 0 refills | Status: DC | PRN
  Filled 2022-06-27 – 2022-08-27 (×3): qty 18, 25d supply, fill #0

## 2022-06-27 MED ORDER — PROMETHAZINE HCL 25 MG PO TABS
25.0000 mg | ORAL_TABLET | Freq: Four times a day (QID) | ORAL | 0 refills | Status: DC | PRN
  Filled 2022-06-27: qty 60, 15d supply, fill #0

## 2022-06-28 ENCOUNTER — Other Ambulatory Visit (HOSPITAL_COMMUNITY): Payer: Self-pay

## 2022-07-01 ENCOUNTER — Ambulatory Visit: Payer: Medicaid Other | Admitting: Allergy and Immunology

## 2022-07-02 ENCOUNTER — Other Ambulatory Visit (HOSPITAL_COMMUNITY): Payer: Self-pay

## 2022-07-03 ENCOUNTER — Other Ambulatory Visit (HOSPITAL_COMMUNITY): Payer: Self-pay

## 2022-07-04 ENCOUNTER — Other Ambulatory Visit: Payer: Self-pay | Admitting: Internal Medicine

## 2022-07-04 ENCOUNTER — Other Ambulatory Visit (HOSPITAL_COMMUNITY): Payer: Self-pay

## 2022-07-04 ENCOUNTER — Other Ambulatory Visit: Payer: Self-pay

## 2022-07-05 ENCOUNTER — Other Ambulatory Visit: Payer: Self-pay | Admitting: Internal Medicine

## 2022-07-08 ENCOUNTER — Ambulatory Visit: Payer: Medicaid Other | Admitting: Internal Medicine

## 2022-07-08 ENCOUNTER — Encounter: Payer: Self-pay | Admitting: Internal Medicine

## 2022-07-09 NOTE — Telephone Encounter (Signed)
Can be rescheduled, but will need to wait for next available given she no-showed yesterday

## 2022-07-10 ENCOUNTER — Other Ambulatory Visit: Payer: Self-pay

## 2022-07-11 ENCOUNTER — Other Ambulatory Visit (HOSPITAL_COMMUNITY): Payer: Self-pay

## 2022-07-14 ENCOUNTER — Other Ambulatory Visit: Payer: Self-pay | Admitting: Internal Medicine

## 2022-07-14 ENCOUNTER — Other Ambulatory Visit (HOSPITAL_COMMUNITY): Payer: Self-pay

## 2022-07-14 MED FILL — Promethazine HCl Tab 25 MG: ORAL | 30 days supply | Qty: 120 | Fill #0 | Status: AC

## 2022-07-21 ENCOUNTER — Other Ambulatory Visit (HOSPITAL_COMMUNITY): Payer: Self-pay

## 2022-07-22 ENCOUNTER — Other Ambulatory Visit (HOSPITAL_COMMUNITY): Payer: Self-pay

## 2022-07-26 ENCOUNTER — Other Ambulatory Visit: Payer: Self-pay | Admitting: Internal Medicine

## 2022-07-26 ENCOUNTER — Other Ambulatory Visit (HOSPITAL_COMMUNITY): Payer: Self-pay

## 2022-07-26 MED ORDER — PROMETHAZINE HCL 25 MG PO TABS
25.0000 mg | ORAL_TABLET | Freq: Four times a day (QID) | ORAL | 0 refills | Status: DC | PRN
  Filled 2022-07-26 – 2022-07-29 (×5): qty 120, 30d supply, fill #0

## 2022-07-28 ENCOUNTER — Encounter: Payer: Self-pay | Admitting: Internal Medicine

## 2022-07-28 NOTE — Telephone Encounter (Signed)
Dr [name] have you seen any forms for this pt? Also, see her complaints and request for promethazine refill.

## 2022-07-29 ENCOUNTER — Other Ambulatory Visit: Payer: Self-pay | Admitting: Internal Medicine

## 2022-07-29 ENCOUNTER — Other Ambulatory Visit (HOSPITAL_COMMUNITY): Payer: Self-pay

## 2022-07-29 ENCOUNTER — Other Ambulatory Visit: Payer: Self-pay

## 2022-07-29 MED ORDER — AMITRIPTYLINE HCL 25 MG PO TABS
25.0000 mg | ORAL_TABLET | Freq: Every day | ORAL | 2 refills | Status: DC
  Filled 2022-07-29: qty 30, 30d supply, fill #0

## 2022-07-30 ENCOUNTER — Telehealth: Payer: Self-pay | Admitting: Internal Medicine

## 2022-07-30 NOTE — Telephone Encounter (Signed)
Inbound call from patient, states her throat is tight and feels like something is lodged in her throat. Also states she has heartburn. She is also requesting a follow up on the forms from her insurance that Dr. [name] would need to fill out. Patient is requesting to speak to a nurse. Please advise.

## 2022-07-30 NOTE — Telephone Encounter (Signed)
Pt reports for the past 3-4 days she has had what feels like a "knot" in her throat. She is eating and drinking ok but she thinks it may be contributing to her nausea. No issues with breathing at all. She describes it as a dull tight pressure. She took some cold medicine as she thought it may have been post nasal drip but reports that wasn't it. Pt also wanting to check on her LTD papers, states [place] has not received them. Please advise.

## 2022-08-04 ENCOUNTER — Other Ambulatory Visit: Payer: Self-pay

## 2022-08-04 ENCOUNTER — Other Ambulatory Visit (HOSPITAL_COMMUNITY): Payer: Self-pay

## 2022-08-04 MED ORDER — PROMETHAZINE HCL 25 MG PO TABS
25.0000 mg | ORAL_TABLET | Freq: Four times a day (QID) | ORAL | 0 refills | Status: DC | PRN
  Filled 2022-08-04 – 2022-08-08 (×2): qty 120, 30d supply, fill #0

## 2022-08-04 NOTE — Telephone Encounter (Signed)
LTD paperwork completed

## 2022-08-04 NOTE — Telephone Encounter (Signed)
Paperwork faxed to number and name as requested below.

## 2022-08-04 NOTE — Telephone Encounter (Signed)
Ok for promethazine refill

## 2022-08-04 NOTE — Telephone Encounter (Signed)
Refill sent to pharmacy.   

## 2022-08-05 ENCOUNTER — Other Ambulatory Visit (HOSPITAL_COMMUNITY): Payer: Self-pay

## 2022-08-06 ENCOUNTER — Telehealth: Payer: Self-pay

## 2022-08-06 DIAGNOSIS — K7460 Unspecified cirrhosis of liver: Secondary | ICD-10-CM

## 2022-08-06 NOTE — Telephone Encounter (Signed)
[place] order in epic. Secure staff message sent to radiology scheduling to contact patient to set up appt.  MyChart message sent to patient with reminder.

## 2022-08-06 NOTE — Telephone Encounter (Signed)
Patient responded to MyChart message. She already has an ultrasound appointment for [facility] (liver transplant clinic) for the 29th of February. Pt was advised to keep this appointment and we will cancel our order.

## 2022-08-06 NOTE — Telephone Encounter (Signed)
-----   Message from Carl Best, RN sent at 02/03/2022  9:31 AM EDT ----- Patient needs 6 month ultrasound. Refer to [place] note 01/30/22. Needs order placed.

## 2022-08-07 ENCOUNTER — Other Ambulatory Visit (HOSPITAL_COMMUNITY): Payer: Self-pay

## 2022-08-08 ENCOUNTER — Other Ambulatory Visit: Payer: Self-pay

## 2022-08-08 ENCOUNTER — Other Ambulatory Visit (HOSPITAL_COMMUNITY): Payer: Self-pay

## 2022-08-15 ENCOUNTER — Other Ambulatory Visit (HOSPITAL_COMMUNITY): Payer: Self-pay

## 2022-08-15 ENCOUNTER — Other Ambulatory Visit: Payer: Self-pay | Admitting: Internal Medicine

## 2022-08-19 ENCOUNTER — Other Ambulatory Visit: Payer: Self-pay

## 2022-08-19 ENCOUNTER — Other Ambulatory Visit: Payer: Self-pay | Admitting: Internal Medicine

## 2022-08-19 MED ORDER — PROMETHAZINE HCL 25 MG PO TABS: 25.0000 mg | ORAL_TABLET | Freq: Four times a day (QID) | ORAL | 0 refills | Status: DC | PRN

## 2022-08-26 ENCOUNTER — Ambulatory Visit: Payer: Medicaid Other | Admitting: Physician Assistant

## 2022-08-27 ENCOUNTER — Other Ambulatory Visit (HOSPITAL_COMMUNITY): Payer: Self-pay

## 2022-08-27 ENCOUNTER — Encounter: Payer: Self-pay | Admitting: Internal Medicine

## 2022-08-27 ENCOUNTER — Other Ambulatory Visit: Payer: Self-pay | Admitting: Internal Medicine

## 2022-08-27 ENCOUNTER — Other Ambulatory Visit (INDEPENDENT_AMBULATORY_CARE_PROVIDER_SITE_OTHER): Payer: Medicaid Other

## 2022-08-27 ENCOUNTER — Other Ambulatory Visit: Payer: Self-pay | Admitting: Allergy and Immunology

## 2022-08-27 ENCOUNTER — Ambulatory Visit (INDEPENDENT_AMBULATORY_CARE_PROVIDER_SITE_OTHER): Payer: Medicaid Other | Admitting: Internal Medicine

## 2022-08-27 VITALS — BP 114/68 | HR 116 | Ht 65.0 in | Wt 228.0 lb

## 2022-08-27 DIAGNOSIS — R000 Tachycardia, unspecified: Secondary | ICD-10-CM

## 2022-08-27 DIAGNOSIS — D509 Iron deficiency anemia, unspecified: Secondary | ICD-10-CM | POA: Diagnosis not present

## 2022-08-27 DIAGNOSIS — G8929 Other chronic pain: Secondary | ICD-10-CM

## 2022-08-27 DIAGNOSIS — K766 Portal hypertension: Secondary | ICD-10-CM

## 2022-08-27 DIAGNOSIS — R109 Unspecified abdominal pain: Secondary | ICD-10-CM | POA: Diagnosis not present

## 2022-08-27 DIAGNOSIS — K200 Eosinophilic esophagitis: Secondary | ICD-10-CM

## 2022-08-27 DIAGNOSIS — K2100 Gastro-esophageal reflux disease with esophagitis, without bleeding: Secondary | ICD-10-CM

## 2022-08-27 DIAGNOSIS — K219 Gastro-esophageal reflux disease without esophagitis: Secondary | ICD-10-CM

## 2022-08-27 LAB — COMPREHENSIVE METABOLIC PANEL
ALT: 10 U/L (ref 0–35)
AST: 15 U/L (ref 0–37)
Albumin: 3.7 g/dL (ref 3.5–5.2)
Alkaline Phosphatase: 45 U/L (ref 39–117)
BUN: 15 mg/dL (ref 6–23)
CO2: 21 mEq/L (ref 19–32)
Calcium: 8.6 mg/dL (ref 8.4–10.5)
Chloride: 111 mEq/L (ref 96–112)
Creatinine, Ser: 0.88 mg/dL (ref 0.40–1.20)
GFR: 88.57 mL/min (ref >60.00)
Glucose, Bld: 90 mg/dL (ref 70–99)
Potassium: 4.5 mEq/L (ref 3.5–5.1)
Sodium: 139 mEq/L (ref 135–145)
Total Bilirubin: 0.3 mg/dL (ref 0.2–1.2)
Total Protein: 5.8 g/dL — ABNORMAL LOW (ref 6.0–8.3)

## 2022-08-27 LAB — CBC WITH DIFFERENTIAL/PLATELET
Basophils Absolute: 0 10*3/uL (ref 0.0–0.1)
Basophils Relative: 0.5 % (ref 0.0–3.0)
Eosinophils Absolute: 0.2 10*3/uL (ref 0.0–0.7)
Eosinophils Relative: 4.5 % (ref 0.0–5.0)
HCT: 28.7 % — ABNORMAL LOW (ref 36.0–46.0)
Hemoglobin: 9 g/dL — ABNORMAL LOW (ref 12.0–15.0)
Lymphocytes Relative: 23.4 % (ref 12.0–46.0)
Lymphs Abs: 0.8 10*3/uL (ref 0.7–4.0)
MCHC: 31.4 g/dL (ref 30.0–36.0)
MCV: 78.2 fl (ref 78.0–100.0)
Monocytes Absolute: 0.3 10*3/uL (ref 0.1–1.0)
Monocytes Relative: 7.3 % (ref 3.0–12.0)
Neutro Abs: 2.2 10*3/uL (ref 1.4–7.7)
Neutrophils Relative %: 64.3 % (ref 43.0–77.0)
Platelets: 86 10*3/uL — ABNORMAL LOW (ref 150.0–400.0)
RBC: 3.67 Mil/uL — ABNORMAL LOW (ref 3.87–5.11)
RDW: 17.6 % — ABNORMAL HIGH (ref 11.5–15.5)
WBC: 3.5 10*3/uL — ABNORMAL LOW (ref 4.0–10.5)

## 2022-08-27 LAB — IBC + FERRITIN
Ferritin: 5.9 ng/mL — ABNORMAL LOW (ref 10.0–291.0)
Iron: 20 ug/dL — ABNORMAL LOW (ref 42–145)
Saturation Ratios: 4.5 % — ABNORMAL LOW (ref 20.0–50.0)
TIBC: 448 ug/dL (ref 250.0–450.0)
Transferrin: 320 mg/dL (ref 212.0–360.0)

## 2022-08-27 LAB — PROTIME-INR
INR: 1 ratio (ref 0.8–1.0)
Prothrombin Time: 11.3 s (ref 9.6–13.1)

## 2022-08-27 LAB — B12 AND FOLATE PANEL
Folate: >23.9 ng/mL (ref >5.9)
Vitamin B-12: 273 pg/mL (ref 211–911)

## 2022-08-27 LAB — TSH: TSH: 3.05 u[IU]/mL (ref 0.35–5.50)

## 2022-08-27 MED ORDER — FAMOTIDINE 40 MG PO TABS
40.0000 mg | ORAL_TABLET | Freq: Two times a day (BID) | ORAL | 1 refills | Status: DC
  Filled 2022-08-27: qty 60, 30d supply, fill #0
  Filled 2022-09-17 – 2022-10-22 (×2): qty 60, 30d supply, fill #1

## 2022-08-27 MED ORDER — EOHILIA 2 MG/10ML PO SUSP
2.0000 mg | Freq: Two times a day (BID) | ORAL | 1 refills | Status: AC
  Filled 2022-08-27: qty 600, 30d supply, fill #0
  Filled 2022-08-27: qty 60, 20d supply, fill #0
  Filled 2022-11-08: qty 600, 30d supply, fill #1

## 2022-08-27 MED ORDER — NADOLOL 40 MG PO TABS
40.0000 mg | ORAL_TABLET | Freq: Every day | ORAL | 1 refills | Status: DC
  Filled 2022-08-27 – 2023-02-26 (×3): qty 90, 90d supply, fill #0

## 2022-08-27 MED ORDER — VOQUEZNA 20 MG PO TABS
20.0000 mg | ORAL_TABLET | Freq: Every day | ORAL | 3 refills | Status: DC
  Filled 2022-08-27 – 2022-08-31 (×2): qty 30, 30d supply, fill #0
  Filled 2022-10-22: qty 30, 30d supply, fill #1
  Filled 2022-11-17: qty 30, 30d supply, fill #2
  Filled 2022-12-21: qty 30, 30d supply, fill #3

## 2022-08-27 NOTE — Telephone Encounter (Signed)
Please see note from patient .  I will send your note to [facility].  Please advise about the remaining request.

## 2022-08-27 NOTE — Patient Instructions (Signed)
_______________________________________________________  If your blood pressure at your visit was 140/90 or greater, please contact your primary care physician to follow up on this.  If you are age 30 or younger, your body mass index should be between 19-25. Your Body mass index is 37.94 kg/m. If this is out of the aformentioned range listed, please consider follow up with your Primary Care Provider.  ________________________________________________________  The [facility] providers would like to encourage you to use [facility] to communicate with providers for non-urgent requests or questions.  Due to long hold times on the telephone, sending your provider a message by [facility] may be a faster and more efficient way to get a response.  Please allow 48 business hours for a response.  Please remember that this is for non-urgent requests.  _______________________________________________________  Your provider has requested that you go to the basement level for lab work before leaving today. Press "B" on the elevator. The lab is located at the first door on the left as you exit the elevator.  We have sent the following medications to your pharmacy for you to pick up at your convenience:  START: Voquezna one tablet daily START: Eohilla '2mg'$  of liquid twice daily  CONTINUE: famotidine  CHANGE: Nadalol to '40mg'$  once daily  DISCONTINUE: pantoprazole  You are scheduled to follow up in our office on 10-31-22 at 10:10am.  Due to recent changes in healthcare laws, you may see the results of your imaging and laboratory studies on MyChart before your provider has had a chance to review them.  We understand that in some cases there may be results that are confusing or concerning to you. Not all laboratory results come back in the same time frame and the provider may be waiting for multiple results in order to interpret others.  Please give [place] 48 hours in order for your provider to thoroughly review all the  results before contacting the office for clarification of your results.   Thank you for entrusting me with your care and choosing [facility].  Dr [name]

## 2022-08-27 NOTE — Progress Notes (Signed)
Subjective:    Patient ID: Linda Becker, female    DOB: Sep 10, 1990, 32 y.o.   MRN: [id number]  HPI Linda Becker is a 32 year old female with a history of MASH cirrhosis with portal hypertension, prior esophageal varices with bleeding, thrombocytopenia, history of obesity status post gastric sleeve, asthma, chronic abdominal pain who is here for follow-up.  She was last seen on 05/05/2022 by Carl Best.  EGD performed on 05/12/2022 revealed mild reflux esophagitis, gastric sleeve anatomy and erosive antral gastritis. Biopsies showed increased intraepithelial eosinophils up to 30 per high-power field on esophageal biopsies.  Gastric biopsy showed reactive gastropathy without H. pylori.  Duodenal biopsies revealed peptic duodenitis.  She continues to struggle on a daily basis with abdominal pain worse in the mid and left abdomen of late.  She has very low energy level and near constant fatigue.  She has tachycardia at rest but also dyspnea with activity.  She has been sleeping poorly because it is difficult for her to get comfortable.  She reports she has terrible heartburn and feeling like she wants to vomit the heartburn gets so severe.  She is taking pantoprazole 40 mg twice daily along with famotidine 40 mg twice daily.  She is using chewing Tums on an as-needed basis.  She is taking nadolol 20 mg twice daily but has run out of her verapamil.  She states she needs to follow-up with cardiology.  Last week she had a mechanical fall down 3 steps while trying to carry laundry to the basement in her parents home.  She has had some neck and shoulder pain since the fall.  She is existing on predominantly protein shakes and ice.  She states she can eat over a 20 pound bag of ice in a week.  She has noticed some mild easy bruising and thinning hair.  Her weight is decreased to 228 and was 246 in November 2023.  Review of Systems As per HPI, otherwise negative  Current Medications,  Allergies, Past Medical History, Past Surgical History, Family History and Social History were reviewed in [employer] record.    Objective:   Physical Exam Ht '5\' 5"'$  (1.651 m)   Wt 228 lb (103.4 kg)   BMI 37.94 kg/m  Gen: awake, alert, NAD HEENT: anicteric  CV: RRR, no mrg Pulm: CTA b/l Abd: soft, diffusely tender without rebound or guarding, no ascites appreciable  +BS throughout Ext: no c/c/e Neuro: nonfocal, no asterixis     Latest Ref Rng & Units 05/13/2022   12:24 PM 05/05/2022    2:40 PM 01/23/2022    2:30 PM  CBC  WBC 4.0 - 10.5 K/uL 3.9  3.8  4.0   Hemoglobin 12.0 - 15.0 g/dL 12.0  10.7  12.9   Hematocrit 36.0 - 46.0 % 36.9  33.4  39.5   Platelets 150.0 - 400.0 K/uL 87.0  80.0  74.0    CMP     Component Value Date/Time   NA 138 05/05/2022 1440   NA 145 01/14/2020 1309   K 4.7 05/05/2022 1440   CL 108 05/05/2022 1440   CO2 24 05/05/2022 1440   GLUCOSE 93 05/05/2022 1440   BUN 13 05/05/2022 1440   BUN 12 01/14/2020 1309   CREATININE 0.68 05/05/2022 1440   CREATININE 0.84 02/07/2020 1412   CALCIUM 8.4 05/05/2022 1440   PROT 6.1 05/05/2022 1440   PROT 6.2 (L) 01/14/2020 1309   ALBUMIN 3.9 05/05/2022 1440   ALBUMIN 3.9 01/14/2020  1309   AST 19 05/05/2022 1440   AST 31 02/07/2020 1412   ALT 19 05/05/2022 1440   ALT 47 (H) 02/07/2020 1412   ALKPHOS 47 05/05/2022 1440   BILITOT 0.4 05/05/2022 1440   BILITOT 0.3 02/07/2020 1412   GFRNONAA >60 05/06/2021 1016   GFRNONAA >60 02/07/2020 1412   GFRAA >60 02/07/2020 1412   Lab Results  Component Value Date   INR 1.2 (H) 05/05/2022   INR 1.2 (H) 01/09/2022   INR 1.2 (H) 11/12/2021    CT ABDOMEN AND PELVIS WITHOUT CONTRAST   TECHNIQUE: Multidetector CT imaging of the abdomen and pelvis was performed following the standard protocol without IV contrast.   RADIATION DOSE REDUCTION: This exam was performed according to the departmental dose-optimization program which includes  automated exposure control, adjustment of the mA and/or kV according to patient size and/or use of iterative reconstruction technique.   COMPARISON:  CT examination dated March 15, 2021   FINDINGS: Lower chest: No acute abnormality.   Hepatobiliary: Micronodular contour of the liver suggesting hepatic cirrhosis. No focal abnormality. No gallstones, gallbladder wall thickening, or biliary dilatation.   Pancreas: Unremarkable. No pancreatic ductal dilatation or surrounding inflammatory changes.   Spleen: Enlarged spleen with prominence of the perisplenic veins suggesting portal venous hypertension.   Adrenals/Urinary Tract: Adrenal glands are unremarkable. Kidneys are normal, without renal calculi, focal lesion, or hydronephrosis. Bladder is unremarkable.   Stomach/Bowel: Postsurgical changes from prior sleeve gastrectomy. Bowel loops are normal in caliber. Appendix not clearly identified, however no inflammatory changes in the pericecal region. No evidence of colitis or diverticulitis.   Vascular/Lymphatic: Recanalization of the umbilical vein suggesting portosystemic anastomosis and portal hypertension. Aorta and branch vessels are within normal limits. Prominence of the perisplenic and tortuous veins and about the GE junction.   Reproductive: Uterus and bilateral adnexa are unremarkable.   Other: Trace amount of fluid in the dependent pelvis. No significant ascites.   Musculoskeletal: Mild degenerate disc disease of the lumbar spine. Intramedullary nail in the right femur. No acute osseous abnormality.   IMPRESSION: 1. No evidence of nephrolithiasis or hydronephrosis. 2. Micronodular contour of the liver suggesting hepatic cirrhosis. No focal abnormality. 3. Splenomegaly with prominence of the perisplenic and tortuous veins and about the GE junction suggesting portal venous hypertension. Recanalization of the umbilical vein suggesting portosystemic anastomosis  and portal hypertension. 4. Bowel loops are normal in caliber. No evidence of colitis or diverticulitis. Appendix not clearly identified, however no inflammatory changes in the pericecal region. 5. Uterus and adnexa are unremarkable. 6. Mild degenerate disc disease of the lumbar spine. No acute osseous abnormality.     Electronically Signed   By: Keane Police D.O.   On: 06/06/2022 13:20  MRI ABDOMEN WITHOUT AND WITH CONTRAST   TECHNIQUE: Multiplanar multisequence MR imaging of the abdomen was performed both before and after the administration of intravenous contrast.   CONTRAST:  28m GADAVIST GADOBUTROL 1 MMOL/ML IV SOLN   COMPARISON:  Multiple priors including CT March 15, 2021 and MRI March 01, 2020.   FINDINGS: Lower chest: No acute abnormality.   Hepatobiliary: No significant hepatic steatosis. Widening of the hepatic fissures with slight contour nodularity consistent with given diagnosis of cirrhosis. No arterially enhancing hepatic lesion. Gallbladder is unremarkable. No biliary ductal dilation.   Pancreas: No pancreatic ductal dilation or evidence of acute inflammation.   Spleen:  Splenomegaly measuring 18.6 cm in maximum axial dimension   Adrenals/Urinary Tract: Bilateral adrenal glands appear normal. No hydronephrosis.  No solid enhancing renal mass.   Stomach/Bowel: Visualized portions within the abdomen are unremarkable.   Vascular/Lymphatic: Normal caliber abdominal aorta. The portal, splenic and superior mesenteric veins are patent. Abdominal collateral vessels including tiny paraesophageal varices and a recannulized paraumbilical vein. No pathologically enlarged abdominal lymph nodes.   Other:  No significant abdominal free fluid.   Musculoskeletal: No suspicious osseous lesion.   IMPRESSION: 1. No acute abnormality identified in the abdomen. 2. Cirrhosis with sequela of portal hypertension including splenomegaly and abdominal collateral  vessels. No ascites or arterially enhancing hepatic lesion.     Electronically Signed   By: Dahlia Bailiff M.D.   On: 03/26/2022 11:58     Assessment & Plan:  32 year old female with a history of MASH cirrhosis with portal hypertension, prior esophageal varices with bleeding, thrombocytopenia, history of obesity status post gastric sleeve, asthma, chronic abdominal pain who is here for follow-up.  MASH cirrhosis with portal HTN/hx of varices/splenomegaly --overall her liver disease continues to be rather well compensated of late.  Her weight loss has been significant and this would overall improve fatty liver type inflammation.  She has no varices on recent EGD.  No evidence for ascites or encephalopathy.  Up-to-date on [place] screening --Continue to follow with [facility] hepatology --Continue ursodiol 500 mg 3 times a day --Variceal screening up-to-date repeat EGD recommended November 2023 --[facility] screening up-to-date consider cross-sectional imaging versus ultrasound after October 2024 --CBC, CMP and INR today -- Continue nadolol but consolidate to 40 mg daily, she remains tachycardic may increase the dose at follow-up  2.  Fatigue/history of IDA --check ferritin and IBC panel today along with blood counts.  Check B12, TSH and folate --May need repeat IV iron  3.  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  She has seen Dr. Neldon Mc in the past for asthma and was on Elliott for some time but it has been years since she was on this medicine.  Max dose acid suppression without benefit to symptoms --Discontinue pantoprazole --Begin vonoprazan 20 mg daily --Can continue famotidine 20 mg twice daily --Given EoE will treat with Eohilia 2 mg BID x 8-12 weeks -- May need Dupixent in the future  4.  Tachycardia --previously on verapamil but off of this.  She needs follow-up with cardiology.  Her continuing nadolol as above  5.  History of SSP of the colon --surveillance colonoscopy due  March 2025  6.  Chronic abdominal pain --unclear etiology.  Avoiding narcotics.  3 to 71-monthfollow-up with me, sooner if  40 minutes total spent today including patient facing time, coordination of care, reviewing medical history/procedures/pertinent radiology studies, and documentation of the encounter.

## 2022-08-28 ENCOUNTER — Other Ambulatory Visit: Payer: Self-pay

## 2022-08-28 ENCOUNTER — Other Ambulatory Visit (HOSPITAL_COMMUNITY): Payer: Self-pay

## 2022-08-28 ENCOUNTER — Telehealth: Payer: Self-pay | Admitting: Pharmacy Technician

## 2022-08-28 ENCOUNTER — Other Ambulatory Visit: Payer: Self-pay | Admitting: Internal Medicine

## 2022-08-28 ENCOUNTER — Ambulatory Visit: Payer: Medicaid Other | Admitting: Physician Assistant

## 2022-08-28 DIAGNOSIS — D509 Iron deficiency anemia, unspecified: Secondary | ICD-10-CM

## 2022-08-28 NOTE — Telephone Encounter (Signed)
The eosinophilic esophagitis treatment should help with the throat pressure and trouble swallowing. I also changed her to vonoprazan from PPI She would have to talk about pain management, narcotic or otherwise with primary care

## 2022-08-28 NOTE — Telephone Encounter (Signed)
Pharmacy Patient Advocate Encounter  Received notification from [facility] that the request for prior authorization for [facility] has been denied due to must have tried/failed Dupixent and Pulmicort neb solution.    EXPEDITED APPEAL HAS BEEN SUMMITED  You may call [phone] or fax [phone], to appeal.

## 2022-08-28 NOTE — Telephone Encounter (Signed)
Dr. [name], Juluis Rainier note:  Auth Submission: NO AUTH NEEDED Payer: [place] MEDICAID Medication & CPT/J Code(s) submitted: Venofer (Iron Sucrose) J1756 Route of submission (phone, fax, portal):  Phone # Fax # Auth type: Buy/Bill Units/visits requested: 5 Reference number:  Approval from: 08/28/22 to 12/28/22  Patient will be scheduled as soon as possible

## 2022-08-28 NOTE — Telephone Encounter (Signed)
Patient Advocate Encounter  Received notification from [facility] that prior authorization for [facility] is required.   PA submitted on 3.14.24 [facility] Status is pending

## 2022-08-28 NOTE — Telephone Encounter (Signed)
Patient Advocate Encounter  Prior Authorization for [facility] has been approved.    PA# [id number] Effective dates: 3.14.24 through 3.14.25  [facility] test billing returns a $4 copay

## 2022-08-29 ENCOUNTER — Other Ambulatory Visit (HOSPITAL_COMMUNITY): Payer: Self-pay

## 2022-08-29 NOTE — Telephone Encounter (Signed)
Dr [name] is it ok to refill phenergan?

## 2022-08-30 ENCOUNTER — Other Ambulatory Visit: Payer: Self-pay | Admitting: Internal Medicine

## 2022-09-01 ENCOUNTER — Other Ambulatory Visit (HOSPITAL_COMMUNITY): Payer: Self-pay

## 2022-09-01 ENCOUNTER — Telehealth: Payer: Self-pay

## 2022-09-01 MED ORDER — PROMETHAZINE HCL 25 MG PO TABS
25.0000 mg | ORAL_TABLET | Freq: Four times a day (QID) | ORAL | 0 refills | Status: DC | PRN
  Filled 2022-09-01: qty 120, 30d supply, fill #0

## 2022-09-01 NOTE — Telephone Encounter (Signed)
PA request received via CMM for Voquezna 20MG  tablets  PA has been submitted to [facility] and is pending determination.  Key: Kennon Portela

## 2022-09-01 NOTE — Addendum Note (Signed)
Addended by: Timothy Lasso on: 09/01/2022 11:22 AM   Modules accepted: Orders

## 2022-09-01 NOTE — Telephone Encounter (Signed)
The pt has been contacted and made aware she can pick up her medications.

## 2022-09-01 NOTE — Telephone Encounter (Signed)
Yes, ok to refill promethazine JMP

## 2022-09-02 NOTE — Telephone Encounter (Signed)
She has GERD WITH esophagitis Not just GERD Thanks

## 2022-09-02 NOTE — Telephone Encounter (Signed)
I do not see a different between GERD with esophagitis and erosive esophagitis -- this is one and the same. Please resubmit for GERD with erosive esophagitis Thanks JMP

## 2022-09-02 NOTE — Telephone Encounter (Signed)
Please see denial.

## 2022-09-03 ENCOUNTER — Other Ambulatory Visit (HOSPITAL_COMMUNITY): Payer: Self-pay

## 2022-09-03 NOTE — Telephone Encounter (Signed)
Appeal submitted via fax. Awaiting determination.

## 2022-09-04 ENCOUNTER — Ambulatory Visit: Payer: Medicaid Other | Admitting: Allergy and Immunology

## 2022-09-05 ENCOUNTER — Telehealth: Payer: Medicaid Other | Admitting: Nurse Practitioner

## 2022-09-05 ENCOUNTER — Other Ambulatory Visit (HOSPITAL_COMMUNITY): Payer: Self-pay

## 2022-09-05 DIAGNOSIS — N3000 Acute cystitis without hematuria: Secondary | ICD-10-CM

## 2022-09-05 DIAGNOSIS — B379 Candidiasis, unspecified: Secondary | ICD-10-CM | POA: Diagnosis not present

## 2022-09-05 DIAGNOSIS — T3695XA Adverse effect of unspecified systemic antibiotic, initial encounter: Secondary | ICD-10-CM | POA: Diagnosis not present

## 2022-09-05 MED ORDER — SULFAMETHOXAZOLE-TRIMETHOPRIM 800-160 MG PO TABS
1.0000 | ORAL_TABLET | Freq: Two times a day (BID) | ORAL | 0 refills | Status: AC
  Filled 2022-09-05: qty 10, 5d supply, fill #0

## 2022-09-05 MED ORDER — FLUCONAZOLE 150 MG PO TABS
150.0000 mg | ORAL_TABLET | Freq: Once | ORAL | 0 refills | Status: AC
  Filled 2022-09-05: qty 1, 1d supply, fill #0

## 2022-09-05 NOTE — Progress Notes (Signed)
E-Visit for Urinary Problems  We are sorry that you are not feeling well.  Here is how we plan to help!  Based on what you shared with me it looks like you most likely have a simple urinary tract infection.  A UTI (Urinary Tract Infection) is a bacterial infection of the bladder.  Most cases of urinary tract infections are simple to treat but a key part of your care is to encourage you to drink plenty of fluids and watch your symptoms carefully.  I have prescribed Bactrim DS One tablet twice a day for 5 days.  Your symptoms should gradually improve. Call us if the burning in your urine worsens, you develop worsening fever, back pain or pelvic pain or if your symptoms do not resolve after completing the antibiotic.  Urinary tract infections can be prevented by drinking plenty of water to keep your body hydrated.  Also be sure when you wipe, wipe from front to back and don't hold it in!  If possible, empty your bladder every 4 hours.  We will send in the Diflucan, please only take this IF you experience symptoms of a yeast infection as a result of your antibiotic   Meds ordered this encounter  Medications   sulfamethoxazole-trimethoprim (BACTRIM DS) 800-160 MG tablet    Sig: Take 1 tablet by mouth 2 (two) times daily for 5 days.    Dispense:  10 tablet    Refill:  0   fluconazole (DIFLUCAN) 150 MG tablet    Sig: Take 1 tablet (150 mg total) by mouth once for 1 dose.    Dispense:  1 tablet    Refill:  0     HOME CARE Drink plenty of fluids Compete the full course of the antibiotics even if the symptoms resolve Remember, when you need to go.go. Holding in your urine can increase the likelihood of getting a UTI! GET HELP RIGHT AWAY IF: You cannot urinate You get a high fever Worsening back pain occurs You see blood in your urine You feel sick to your stomach or throw up You feel like you are going to pass out  MAKE SURE YOU  Understand these instructions. Will watch your  condition. Will get help right away if you are not doing well or get worse.   Thank you for choosing an e-visit.  Your e-visit answers were reviewed by a board certified advanced clinical practitioner to complete your personal care plan. Depending upon the condition, your plan could have included both over the counter or prescription medications.  Please review your pharmacy choice. Make sure the pharmacy is open so you can pick up prescription now. If there is a problem, you may contact your provider through [employer] and have the prescription routed to another pharmacy.  Your safety is important to [place]. If you have drug allergies check your prescription carefully.   For the next 24 hours you can use MyChart to ask questions about today's visit, request a non-urgent call back, or ask for a work or school excuse. You will get an email in the next two days asking about your experience. I hope that your e-visit has been valuable and will speed your recovery.  I spent approximately 5 minutes reviewing the patient's history, current symptoms and coordinating their care today.

## 2022-09-08 ENCOUNTER — Ambulatory Visit: Payer: Medicaid Other

## 2022-09-08 MED ORDER — DIPHENHYDRAMINE HCL 25 MG PO CAPS: 25.0000 mg | ORAL_CAPSULE | Freq: Once | ORAL | Status: DC

## 2022-09-08 MED ORDER — ACETAMINOPHEN 325 MG PO TABS: 650.0000 mg | ORAL_TABLET | Freq: Once | ORAL | Status: DC

## 2022-09-08 MED ORDER — SODIUM CHLORIDE 0.9 % IV SOLN
200.0000 mg | Freq: Once | INTRAVENOUS | Status: DC
  Filled 2022-09-08: qty 10

## 2022-09-08 NOTE — Progress Notes (Signed)
Patient was not seen, had to reschedule.

## 2022-09-10 ENCOUNTER — Other Ambulatory Visit: Payer: Self-pay | Admitting: Internal Medicine

## 2022-09-10 ENCOUNTER — Ambulatory Visit (INDEPENDENT_AMBULATORY_CARE_PROVIDER_SITE_OTHER): Payer: Medicaid Other

## 2022-09-10 ENCOUNTER — Other Ambulatory Visit: Payer: Self-pay

## 2022-09-10 ENCOUNTER — Other Ambulatory Visit (HOSPITAL_COMMUNITY): Payer: Self-pay

## 2022-09-10 VITALS — BP 115/72 | HR 95 | Temp 97.4°F | Resp 16 | Ht 65.0 in | Wt 219.6 lb

## 2022-09-10 DIAGNOSIS — D509 Iron deficiency anemia, unspecified: Secondary | ICD-10-CM | POA: Diagnosis not present

## 2022-09-10 MED ORDER — SODIUM CHLORIDE 0.9 % IV SOLN
200.0000 mg | Freq: Once | INTRAVENOUS | Status: AC
  Administered 2022-09-10: 200 mg via INTRAVENOUS
  Filled 2022-09-10: qty 10

## 2022-09-10 MED ORDER — DIPHENHYDRAMINE HCL 25 MG PO CAPS
25.0000 mg | ORAL_CAPSULE | Freq: Once | ORAL | Status: AC
  Administered 2022-09-10: 25 mg via ORAL
  Filled 2022-09-10: qty 1

## 2022-09-10 MED ORDER — ACETAMINOPHEN 325 MG PO TABS
650.0000 mg | ORAL_TABLET | Freq: Once | ORAL | Status: AC
  Administered 2022-09-10: 650 mg via ORAL
  Filled 2022-09-10: qty 2

## 2022-09-10 NOTE — Progress Notes (Cosign Needed Addendum)
Diagnosis: Iron Deficiency Anemia  Provider:  Marshell Garfinkel MD  Procedure: Infusion  IV Type: Peripheral, IV Location: R Antecubital  Venofer (Iron Sucrose), Dose: 200 mg  Infusion Start Time: [id number]  Infusion Stop Time: [id number]  Post Infusion IV Care: Patient declined observation and Peripheral IV Discontinued  Discharge: Condition: Stable, Destination: Home . AVS Declined  Performed by:  Binnie Kand, RN

## 2022-09-12 ENCOUNTER — Ambulatory Visit (INDEPENDENT_AMBULATORY_CARE_PROVIDER_SITE_OTHER): Payer: Medicaid Other | Admitting: *Deleted

## 2022-09-12 VITALS — BP 116/73 | HR 100 | Temp 98.9°F | Resp 12 | Ht 65.0 in | Wt 220.2 lb

## 2022-09-12 DIAGNOSIS — D509 Iron deficiency anemia, unspecified: Secondary | ICD-10-CM

## 2022-09-12 MED ORDER — SODIUM CHLORIDE 0.9 % IV SOLN
200.0000 mg | Freq: Once | INTRAVENOUS | Status: AC
  Administered 2022-09-12: 200 mg via INTRAVENOUS
  Filled 2022-09-12: qty 10

## 2022-09-12 MED ORDER — DIPHENHYDRAMINE HCL 25 MG PO CAPS
25.0000 mg | ORAL_CAPSULE | Freq: Once | ORAL | Status: AC
  Administered 2022-09-12: 25 mg via ORAL
  Filled 2022-09-12: qty 1

## 2022-09-12 MED ORDER — ACETAMINOPHEN 325 MG PO TABS
650.0000 mg | ORAL_TABLET | Freq: Once | ORAL | Status: AC
  Administered 2022-09-12: 650 mg via ORAL
  Filled 2022-09-12: qty 2

## 2022-09-12 NOTE — Progress Notes (Signed)
Diagnosis: Iron Deficiency Anemia  Provider:  Marshell Garfinkel MD  Procedure: Infusion  IV Type: Peripheral, IV Location: L Antecubital  Venofer (Iron Sucrose), Dose: 200 mg  Infusion Start Time: [id number] pm  Infusion Stop Time: 1234 pm  Post Infusion IV Care: Observation period completed and Peripheral IV Discontinued  Discharge: Condition: Good, Destination: Home . AVS Provided and AVS Declined  Performed by:  Oren Beckmann, RN

## 2022-09-15 ENCOUNTER — Encounter: Payer: Self-pay | Admitting: *Deleted

## 2022-09-15 ENCOUNTER — Other Ambulatory Visit: Payer: Self-pay | Admitting: Internal Medicine

## 2022-09-15 ENCOUNTER — Ambulatory Visit (INDEPENDENT_AMBULATORY_CARE_PROVIDER_SITE_OTHER): Payer: Medicaid Other | Admitting: *Deleted

## 2022-09-15 ENCOUNTER — Other Ambulatory Visit (HOSPITAL_COMMUNITY): Payer: Self-pay

## 2022-09-15 VITALS — BP 109/72 | HR 97 | Temp 97.7°F | Resp 18 | Ht 65.0 in | Wt 222.4 lb

## 2022-09-15 DIAGNOSIS — D509 Iron deficiency anemia, unspecified: Secondary | ICD-10-CM | POA: Diagnosis not present

## 2022-09-15 MED ORDER — ACETAMINOPHEN 325 MG PO TABS: 650.0000 mg | ORAL_TABLET | Freq: Once | ORAL | Status: DC

## 2022-09-15 MED ORDER — DIPHENHYDRAMINE HCL 25 MG PO CAPS: 25.0000 mg | ORAL_CAPSULE | Freq: Once | ORAL | Status: DC

## 2022-09-15 MED ORDER — SODIUM CHLORIDE 0.9 % IV SOLN
200.0000 mg | Freq: Once | INTRAVENOUS | Status: AC
  Administered 2022-09-15: 200 mg via INTRAVENOUS
  Filled 2022-09-15: qty 10

## 2022-09-15 NOTE — Progress Notes (Signed)
Diagnosis: Iron Deficiency Anemia  Provider:  Marshell Garfinkel MD  Procedure: Infusion  IV Type: Peripheral, IV Location: R Forearm  Venofer (Iron Sucrose), Dose: 200 mg  Infusion Start Time: [id number] am  Infusion Stop Time: 1200 noon  Post Infusion IV Care: Observation period completed and Peripheral IV Discontinued  Discharge: Condition: Good, Destination: Home . AVS Provided and AVS Declined  Performed by:  Oren Beckmann, RN

## 2022-09-16 ENCOUNTER — Other Ambulatory Visit (HOSPITAL_COMMUNITY): Payer: Self-pay

## 2022-09-16 ENCOUNTER — Other Ambulatory Visit: Payer: Self-pay | Admitting: Internal Medicine

## 2022-09-16 ENCOUNTER — Ambulatory Visit: Payer: Medicaid Other

## 2022-09-16 MED ORDER — PROMETHAZINE HCL 25 MG PO TABS
25.0000 mg | ORAL_TABLET | Freq: Four times a day (QID) | ORAL | 0 refills | Status: DC | PRN
  Filled 2022-09-16 – 2022-09-17 (×5): qty 120, 30d supply, fill #0

## 2022-09-16 MED ORDER — NITROFURANTOIN MONOHYD MACRO 100 MG PO CAPS
100.0000 mg | ORAL_CAPSULE | Freq: Two times a day (BID) | ORAL | 0 refills | Status: DC
  Filled 2022-09-16: qty 14, 7d supply, fill #0

## 2022-09-17 ENCOUNTER — Ambulatory Visit: Payer: Medicaid Other

## 2022-09-17 ENCOUNTER — Other Ambulatory Visit (HOSPITAL_COMMUNITY): Payer: Self-pay

## 2022-09-17 ENCOUNTER — Encounter: Payer: Self-pay | Admitting: Internal Medicine

## 2022-09-17 ENCOUNTER — Other Ambulatory Visit: Payer: Self-pay | Admitting: Allergy and Immunology

## 2022-09-17 MED ORDER — DIPHENHYDRAMINE HCL 25 MG PO CAPS: 25.0000 mg | ORAL_CAPSULE | Freq: Once | ORAL | Status: DC

## 2022-09-17 MED ORDER — SODIUM CHLORIDE 0.9 % IV SOLN
200.0000 mg | Freq: Once | INTRAVENOUS | Status: DC
  Filled 2022-09-17: qty 10

## 2022-09-17 MED ORDER — ACETAMINOPHEN 325 MG PO TABS: 650.0000 mg | ORAL_TABLET | Freq: Once | ORAL | Status: DC

## 2022-09-17 MED ORDER — ALBUTEROL SULFATE HFA 108 (90 BASE) MCG/ACT IN AERS
2.0000 | INHALATION_SPRAY | Freq: Four times a day (QID) | RESPIRATORY_TRACT | 0 refills | Status: DC | PRN
  Filled 2022-09-17 – 2022-11-03 (×2): qty 18, 25d supply, fill #0

## 2022-09-18 ENCOUNTER — Ambulatory Visit: Payer: Medicaid Other

## 2022-09-18 ENCOUNTER — Other Ambulatory Visit (HOSPITAL_COMMUNITY): Payer: Self-pay

## 2022-09-18 ENCOUNTER — Encounter: Payer: Self-pay | Admitting: Internal Medicine

## 2022-09-18 NOTE — Progress Notes (Signed)
Not seen. Pt canceled

## 2022-09-19 ENCOUNTER — Ambulatory Visit (INDEPENDENT_AMBULATORY_CARE_PROVIDER_SITE_OTHER): Payer: Medicaid Other | Admitting: *Deleted

## 2022-09-19 VITALS — BP 104/61 | HR 102 | Temp 98.3°F | Resp 18 | Ht 65.0 in | Wt 222.2 lb

## 2022-09-19 DIAGNOSIS — D509 Iron deficiency anemia, unspecified: Secondary | ICD-10-CM

## 2022-09-19 MED ORDER — DIPHENHYDRAMINE HCL 25 MG PO CAPS: 25.0000 mg | ORAL_CAPSULE | Freq: Once | ORAL | Status: DC

## 2022-09-19 MED ORDER — SODIUM CHLORIDE 0.9 % IV SOLN
200.0000 mg | Freq: Once | INTRAVENOUS | Status: AC
  Administered 2022-09-19: 200 mg via INTRAVENOUS
  Filled 2022-09-19: qty 10

## 2022-09-19 MED ORDER — ACETAMINOPHEN 325 MG PO TABS: 650.0000 mg | ORAL_TABLET | Freq: Once | ORAL | Status: DC

## 2022-09-19 NOTE — Progress Notes (Signed)
Diagnosis: Iron Deficiency Anemia  Provider:  Chilton Greathouse MD  Procedure: Infusion  IV Type: Peripheral, IV Location: R Forearm  Venofer (Iron Sucrose), Dose: 200 mg  Infusion Start Time: 1338 pm  Infusion Stop Time: 1355 1338 pm Post Infusion IV Care: Observation period completed and Peripheral IV Discontinued  Discharge: Condition: Good, Destination: Home . AVS Provided and AVS Declined  Performed by:  Forrest Moron, RN

## 2022-09-22 ENCOUNTER — Ambulatory Visit (INDEPENDENT_AMBULATORY_CARE_PROVIDER_SITE_OTHER): Payer: Medicaid Other

## 2022-09-22 VITALS — BP 97/62 | HR 110 | Temp 98.6°F | Resp 18 | Ht 65.0 in | Wt 225.4 lb

## 2022-09-22 DIAGNOSIS — D509 Iron deficiency anemia, unspecified: Secondary | ICD-10-CM | POA: Diagnosis not present

## 2022-09-22 MED ORDER — SODIUM CHLORIDE 0.9 % IV SOLN
200.0000 mg | Freq: Once | INTRAVENOUS | Status: AC
  Administered 2022-09-22: 200 mg via INTRAVENOUS
  Filled 2022-09-22: qty 10

## 2022-09-22 MED ORDER — DIPHENHYDRAMINE HCL 25 MG PO CAPS: 25.0000 mg | ORAL_CAPSULE | Freq: Once | ORAL | Status: DC

## 2022-09-22 MED ORDER — ACETAMINOPHEN 325 MG PO TABS: 650.0000 mg | ORAL_TABLET | Freq: Once | ORAL | Status: DC

## 2022-09-22 NOTE — Progress Notes (Signed)
Diagnosis: Iron Deficiency Anemia  Provider:  Chilton Greathouse MD  Procedure: Infusion  IV Type: Peripheral, IV Location: R Antecubital  Venofer (Iron Sucrose), Dose: 200 mg  Infusion Start Time: 1154  Infusion Stop Time: 1218  Post Infusion IV Care: Peripheral IV Discontinued  Discharge: Condition: Good, Destination: Home . AVS Declined  Performed by:  Loney Hering, LPN

## 2022-09-23 ENCOUNTER — Encounter: Payer: Self-pay | Admitting: Internal Medicine

## 2022-09-25 ENCOUNTER — Other Ambulatory Visit (HOSPITAL_COMMUNITY): Payer: Self-pay

## 2022-09-27 ENCOUNTER — Telehealth: Payer: Medicaid Other | Admitting: Nurse Practitioner

## 2022-09-27 DIAGNOSIS — R110 Nausea: Secondary | ICD-10-CM | POA: Diagnosis not present

## 2022-09-27 MED ORDER — PROMETHAZINE HCL 25 MG PO TABS
25.0000 mg | ORAL_TABLET | Freq: Four times a day (QID) | ORAL | 0 refills | Status: DC | PRN
Start: 2022-09-27 — End: 2022-10-13

## 2022-09-27 NOTE — Progress Notes (Signed)
A 7 day supply of phenergan has been sent. I can not guarantee your insurance will allow or pay for this just FYI. It may be out of pocket.   HOME CARE: Drink clear liquids.  This is very important! Dehydration (the lack of fluid) can lead to a serious complication.  Start off with 1 tablespoon every 5 minutes for 8 hours. You may begin eating bland foods after 8 hours without vomiting.  Start with saltine crackers, white bread, rice, mashed potatoes, applesauce. After 48 hours on a bland diet, you may resume a normal diet. Try to go to sleep.  Sleep often empties the stomach and relieves the need to vomit.  GET HELP RIGHT AWAY IF:  Your symptoms do not improve or worsen within 2 days after treatment. You have a fever for over 3 days. You cannot keep down fluids after trying the medication.  MAKE SURE YOU:  Understand these instructions. Will watch your condition. Will get help right away if you are not doing well or get worse.   Thank you for choosing an e-visit.  Your e-visit answers were reviewed by a board certified advanced clinical practitioner to complete your personal care plan. Depending upon the condition, your plan could have included both over the counter or prescription medications.  Please review your pharmacy choice. Make sure the pharmacy is open so you can pick up prescription now. If there is a problem, you may contact your provider through [employer] and have the prescription routed to another pharmacy.  Your safety is important to [place]. If you have drug allergies check your prescription carefully.   For the next 24 hours you can use MyChart to ask questions about today's visit, request a non-urgent call back, or ask for a work or school excuse. You will get an email in the next two days asking about your experience. I hope that your e-visit has been valuable and will speed your recovery.

## 2022-09-27 NOTE — Progress Notes (Signed)
I have spent 5 minutes in review of e-visit questionnaire, review and updating patient chart, medical decision making and response to patient.  ° °Oriya Kettering W Antavious Spanos, NP ° °  °

## 2022-10-01 ENCOUNTER — Other Ambulatory Visit (HOSPITAL_COMMUNITY): Payer: Self-pay

## 2022-10-01 MED ORDER — NALOXONE HCL 4 MG/0.1ML NA LIQD
NASAL | 3 refills | Status: DC
  Filled 2022-10-01: qty 2, 10d supply, fill #0

## 2022-10-01 MED ORDER — CYCLOBENZAPRINE HCL 10 MG PO TABS
10.0000 mg | ORAL_TABLET | Freq: Three times a day (TID) | ORAL | 0 refills | Status: DC | PRN
  Filled 2022-10-01: qty 15, 5d supply, fill #0

## 2022-10-01 MED ORDER — OXYCODONE-ACETAMINOPHEN 5-325 MG PO TABS
1.0000 | ORAL_TABLET | Freq: Three times a day (TID) | ORAL | 0 refills | Status: DC
  Filled 2022-10-01: qty 15, 5d supply, fill #0

## 2022-10-08 ENCOUNTER — Encounter: Payer: Self-pay | Admitting: Internal Medicine

## 2022-10-08 ENCOUNTER — Other Ambulatory Visit: Payer: Self-pay

## 2022-10-08 ENCOUNTER — Other Ambulatory Visit (HOSPITAL_COMMUNITY): Payer: Self-pay

## 2022-10-08 MED ORDER — OXYCODONE-ACETAMINOPHEN 5-325 MG PO TABS
1.0000 | ORAL_TABLET | Freq: Three times a day (TID) | ORAL | 0 refills | Status: DC | PRN
  Filled 2022-10-08: qty 21, 7d supply, fill #0

## 2022-10-08 MED ORDER — ERGOCALCIFEROL 1.25 MG (50000 UT) PO CAPS
1.0000 | ORAL_CAPSULE | ORAL | 1 refills | Status: DC
  Filled 2022-10-08 – 2022-10-22 (×2): qty 12, 84d supply, fill #0
  Filled 2022-12-22 – 2023-10-07 (×3): qty 12, 84d supply, fill #1

## 2022-10-08 MED ORDER — CYCLOBENZAPRINE HCL 10 MG PO TABS
10.0000 mg | ORAL_TABLET | Freq: Three times a day (TID) | ORAL | 0 refills | Status: DC | PRN
  Filled 2022-10-08 – 2023-01-15 (×7): qty 21, 7d supply, fill #0

## 2022-10-09 ENCOUNTER — Other Ambulatory Visit (HOSPITAL_BASED_OUTPATIENT_CLINIC_OR_DEPARTMENT_OTHER): Payer: Self-pay

## 2022-10-10 ENCOUNTER — Other Ambulatory Visit (HOSPITAL_COMMUNITY): Payer: Self-pay

## 2022-10-10 ENCOUNTER — Other Ambulatory Visit: Payer: Self-pay

## 2022-10-10 DIAGNOSIS — K7460 Unspecified cirrhosis of liver: Secondary | ICD-10-CM

## 2022-10-10 NOTE — Telephone Encounter (Signed)
Complete abdominal ultrasound May or June; [facility] screening

## 2022-10-13 ENCOUNTER — Other Ambulatory Visit: Payer: Self-pay

## 2022-10-13 ENCOUNTER — Other Ambulatory Visit (HOSPITAL_COMMUNITY): Payer: Self-pay

## 2022-10-13 DIAGNOSIS — R110 Nausea: Secondary | ICD-10-CM

## 2022-10-13 MED ORDER — PROMETHAZINE HCL 25 MG PO TABS
25.0000 mg | ORAL_TABLET | Freq: Four times a day (QID) | ORAL | 0 refills | Status: DC | PRN
Start: 2022-10-13 — End: 2022-10-15
  Filled 2022-10-13: qty 28, 7d supply, fill #0

## 2022-10-15 ENCOUNTER — Encounter: Payer: Self-pay | Admitting: Internal Medicine

## 2022-10-15 ENCOUNTER — Other Ambulatory Visit (HOSPITAL_COMMUNITY): Payer: Self-pay

## 2022-10-15 ENCOUNTER — Other Ambulatory Visit: Payer: Self-pay

## 2022-10-15 DIAGNOSIS — R110 Nausea: Secondary | ICD-10-CM

## 2022-10-15 MED ORDER — PROMETHAZINE HCL 25 MG PO TABS
25.0000 mg | ORAL_TABLET | Freq: Four times a day (QID) | ORAL | 0 refills | Status: DC | PRN
Start: 2022-10-15 — End: 2022-10-24
  Filled 2022-10-15 – 2022-10-16 (×2): qty 120, 30d supply, fill #0

## 2022-10-15 MED ORDER — TIZANIDINE HCL 4 MG PO TABS
4.0000 mg | ORAL_TABLET | Freq: Four times a day (QID) | ORAL | 0 refills | Status: DC | PRN
  Filled 2022-10-15: qty 120, 30d supply, fill #0

## 2022-10-15 MED ORDER — OXYCODONE-ACETAMINOPHEN 5-325 MG PO TABS
1.0000 | ORAL_TABLET | Freq: Three times a day (TID) | ORAL | 0 refills | Status: DC | PRN
  Filled 2022-10-15: qty 90, 30d supply, fill #0

## 2022-10-16 ENCOUNTER — Other Ambulatory Visit (HOSPITAL_COMMUNITY): Payer: Self-pay

## 2022-10-22 ENCOUNTER — Other Ambulatory Visit (HOSPITAL_COMMUNITY): Payer: Self-pay

## 2022-10-23 ENCOUNTER — Other Ambulatory Visit (HOSPITAL_COMMUNITY): Payer: Self-pay

## 2022-10-24 ENCOUNTER — Other Ambulatory Visit (HOSPITAL_COMMUNITY): Payer: Self-pay

## 2022-10-24 ENCOUNTER — Other Ambulatory Visit: Payer: Self-pay | Admitting: Internal Medicine

## 2022-10-24 DIAGNOSIS — R110 Nausea: Secondary | ICD-10-CM

## 2022-10-24 MED ORDER — PROMETHAZINE HCL 25 MG PO TABS
25.0000 mg | ORAL_TABLET | Freq: Four times a day (QID) | ORAL | 0 refills | Status: DC | PRN
Start: 2022-10-24 — End: 2022-11-06
  Filled 2022-10-24 – 2022-10-31 (×4): qty 20, 5d supply, fill #0

## 2022-10-25 ENCOUNTER — Other Ambulatory Visit (HOSPITAL_COMMUNITY): Payer: Self-pay

## 2022-10-25 ENCOUNTER — Encounter: Payer: Self-pay | Admitting: Internal Medicine

## 2022-10-31 ENCOUNTER — Other Ambulatory Visit (HOSPITAL_COMMUNITY): Payer: Self-pay

## 2022-10-31 ENCOUNTER — Ambulatory Visit: Payer: Medicaid Other | Admitting: Internal Medicine

## 2022-10-31 ENCOUNTER — Other Ambulatory Visit: Payer: Self-pay | Admitting: Internal Medicine

## 2022-10-31 ENCOUNTER — Encounter: Payer: Self-pay | Admitting: Internal Medicine

## 2022-10-31 DIAGNOSIS — R110 Nausea: Secondary | ICD-10-CM

## 2022-11-03 ENCOUNTER — Telehealth: Payer: Self-pay | Admitting: Internal Medicine

## 2022-11-03 ENCOUNTER — Other Ambulatory Visit: Payer: Self-pay

## 2022-11-03 ENCOUNTER — Other Ambulatory Visit (HOSPITAL_COMMUNITY): Payer: Self-pay

## 2022-11-03 NOTE — Telephone Encounter (Signed)
Inbound call from patient wanting to speak with a nurse in regards some discomfort.Please advise.

## 2022-11-03 NOTE — Telephone Encounter (Signed)
Pt was supposed to see Dr. Rhea Belton Friday. She is calling to address some of the items she wanted to cover at the visit.  Nausea-reports she is taking phenergan as often as she can and she throws it back up about half the time. She has tried meclizine and dramamine, insurance would not cover scopolamine patches. She wonders if zofran might be an option and she could have phenergan for prn use. Reports being bed bound due to the nausea. She has pressure in her throat and states she was given eohilia for this. Reports she cannot take this anymore as she cannot stomach it due to the nausea. Reports she would be open to Dupixent if Dr. Rhea Belton thinks that would help the pressure in her throat.  Report Bad headaches but knows that is not Dr. Lauro Franklin area. She states she still needs to schedule her [place].  Please advise.

## 2022-11-04 ENCOUNTER — Other Ambulatory Visit: Payer: Self-pay | Admitting: Internal Medicine

## 2022-11-04 ENCOUNTER — Encounter: Payer: Self-pay | Admitting: Internal Medicine

## 2022-11-04 ENCOUNTER — Other Ambulatory Visit (HOSPITAL_COMMUNITY): Payer: Self-pay

## 2022-11-05 ENCOUNTER — Other Ambulatory Visit: Payer: Self-pay

## 2022-11-05 ENCOUNTER — Encounter (HOSPITAL_COMMUNITY): Payer: Self-pay

## 2022-11-05 ENCOUNTER — Other Ambulatory Visit (HOSPITAL_COMMUNITY): Payer: Self-pay

## 2022-11-05 MED ORDER — ONDANSETRON 4 MG PO TBDP
4.0000 mg | ORAL_TABLET | Freq: Three times a day (TID) | ORAL | 3 refills | Status: DC | PRN
  Filled 2022-11-05: qty 60, 20d supply, fill #0
  Filled 2022-11-17 – 2022-11-25 (×2): qty 60, 20d supply, fill #1
  Filled 2022-12-12: qty 60, 20d supply, fill #2
  Filled 2022-12-22 – 2023-01-06 (×3): qty 60, 20d supply, fill #3

## 2022-11-05 NOTE — Telephone Encounter (Signed)
Pt notified via [employer]. Prescription sent to pharmacy.

## 2022-11-05 NOTE — Telephone Encounter (Signed)
Patient called stating she has not heard anything back since 05/20. Requesting a call back. Please advise, thank you.

## 2022-11-05 NOTE — Telephone Encounter (Signed)
Zofran 4 to 8 mg ODT every 8 hours as needed nausea; Max dose is 24 mg in any 24-hour period Phenergan as a backup Ultrasound right upper quadrant Remain off Eohilia See if esophageal/throat pain improves off of budesonide

## 2022-11-06 ENCOUNTER — Other Ambulatory Visit: Payer: Self-pay

## 2022-11-06 ENCOUNTER — Other Ambulatory Visit (HOSPITAL_COMMUNITY): Payer: Self-pay

## 2022-11-06 DIAGNOSIS — R110 Nausea: Secondary | ICD-10-CM

## 2022-11-06 MED ORDER — PROMETHAZINE HCL 25 MG PO TABS
25.0000 mg | ORAL_TABLET | Freq: Three times a day (TID) | ORAL | 0 refills | Status: DC | PRN
  Filled 2022-11-06: qty 90, 30d supply, fill #0
  Filled 2022-11-06: qty 90, fill #0
  Filled 2022-11-07: qty 90, 30d supply, fill #0

## 2022-11-06 NOTE — Telephone Encounter (Signed)
Yes, refill enough to use TID x 30 days

## 2022-11-07 ENCOUNTER — Other Ambulatory Visit (HOSPITAL_COMMUNITY): Payer: Self-pay

## 2022-11-08 ENCOUNTER — Other Ambulatory Visit (HOSPITAL_COMMUNITY): Payer: Self-pay

## 2022-11-14 ENCOUNTER — Other Ambulatory Visit (HOSPITAL_COMMUNITY): Payer: Self-pay

## 2022-11-17 ENCOUNTER — Other Ambulatory Visit: Payer: Self-pay | Admitting: Internal Medicine

## 2022-11-17 ENCOUNTER — Other Ambulatory Visit (HOSPITAL_COMMUNITY): Payer: Self-pay

## 2022-11-17 ENCOUNTER — Other Ambulatory Visit: Payer: Self-pay | Admitting: Allergy and Immunology

## 2022-11-17 NOTE — Telephone Encounter (Signed)
I called patient to schedule an appointment. Albuterol courtesy refill was last sent in April and patient no showed twice. She needs to schedule an appointment for further refills.

## 2022-11-18 ENCOUNTER — Other Ambulatory Visit (HOSPITAL_COMMUNITY): Payer: Self-pay

## 2022-11-22 ENCOUNTER — Other Ambulatory Visit (HOSPITAL_COMMUNITY): Payer: Self-pay

## 2022-11-24 ENCOUNTER — Other Ambulatory Visit (HOSPITAL_COMMUNITY): Payer: Self-pay

## 2022-11-25 ENCOUNTER — Other Ambulatory Visit: Payer: Self-pay | Admitting: Internal Medicine

## 2022-11-25 ENCOUNTER — Other Ambulatory Visit (HOSPITAL_COMMUNITY): Payer: Self-pay

## 2022-11-25 ENCOUNTER — Other Ambulatory Visit: Payer: Self-pay

## 2022-11-26 ENCOUNTER — Other Ambulatory Visit (HOSPITAL_COMMUNITY): Payer: Self-pay

## 2022-12-01 ENCOUNTER — Other Ambulatory Visit (HOSPITAL_COMMUNITY): Payer: Self-pay

## 2022-12-02 ENCOUNTER — Other Ambulatory Visit (HOSPITAL_COMMUNITY): Payer: Self-pay

## 2022-12-02 MED ORDER — ACETAMINOPHEN-CODEINE 300-30 MG PO TABS
1.0000 | ORAL_TABLET | Freq: Four times a day (QID) | ORAL | 0 refills | Status: DC
  Filled 2022-12-02: qty 12, 3d supply, fill #0

## 2022-12-03 ENCOUNTER — Other Ambulatory Visit: Payer: Self-pay | Admitting: Internal Medicine

## 2022-12-04 ENCOUNTER — Other Ambulatory Visit: Payer: Self-pay | Admitting: Internal Medicine

## 2022-12-04 ENCOUNTER — Other Ambulatory Visit (HOSPITAL_COMMUNITY): Payer: Self-pay

## 2022-12-05 ENCOUNTER — Other Ambulatory Visit (HOSPITAL_COMMUNITY): Payer: Self-pay

## 2022-12-05 ENCOUNTER — Other Ambulatory Visit: Payer: Self-pay | Admitting: Internal Medicine

## 2022-12-05 MED ORDER — PROMETHAZINE HCL 25 MG PO TABS
25.0000 mg | ORAL_TABLET | Freq: Three times a day (TID) | ORAL | 0 refills | Status: DC | PRN
  Filled 2022-12-05: qty 90, 30d supply, fill #0

## 2022-12-08 ENCOUNTER — Other Ambulatory Visit (HOSPITAL_COMMUNITY): Payer: Self-pay

## 2022-12-10 ENCOUNTER — Other Ambulatory Visit: Payer: Self-pay | Admitting: Internal Medicine

## 2022-12-11 ENCOUNTER — Other Ambulatory Visit (HOSPITAL_COMMUNITY): Payer: Self-pay

## 2022-12-11 MED ORDER — ACETAMINOPHEN-CODEINE 300-30 MG PO TABS
1.0000 | ORAL_TABLET | Freq: Four times a day (QID) | ORAL | 0 refills | Status: DC
  Filled 2022-12-11: qty 12, 3d supply, fill #0

## 2022-12-12 ENCOUNTER — Other Ambulatory Visit (HOSPITAL_COMMUNITY): Payer: Self-pay

## 2022-12-12 ENCOUNTER — Other Ambulatory Visit: Payer: Self-pay

## 2022-12-16 ENCOUNTER — Other Ambulatory Visit (HOSPITAL_COMMUNITY): Payer: Self-pay

## 2022-12-21 ENCOUNTER — Other Ambulatory Visit: Payer: Self-pay | Admitting: Internal Medicine

## 2022-12-22 ENCOUNTER — Other Ambulatory Visit: Payer: Self-pay | Admitting: Family

## 2022-12-22 ENCOUNTER — Other Ambulatory Visit (HOSPITAL_COMMUNITY): Payer: Self-pay

## 2022-12-22 ENCOUNTER — Other Ambulatory Visit: Payer: Self-pay

## 2022-12-22 ENCOUNTER — Other Ambulatory Visit: Payer: Self-pay | Admitting: Internal Medicine

## 2022-12-22 ENCOUNTER — Telehealth: Payer: Self-pay | Admitting: Internal Medicine

## 2022-12-22 ENCOUNTER — Encounter: Payer: Self-pay | Admitting: Internal Medicine

## 2022-12-22 DIAGNOSIS — D509 Iron deficiency anemia, unspecified: Secondary | ICD-10-CM

## 2022-12-22 DIAGNOSIS — K7460 Unspecified cirrhosis of liver: Secondary | ICD-10-CM

## 2022-12-22 NOTE — Telephone Encounter (Signed)
Inbound call from patient requesting a urgent call back regarding previous mychart message. Advised her that her message was sent to Dr. Rhea Belton for review. Please advise, thank you.

## 2022-12-22 NOTE — Telephone Encounter (Signed)
Tried to call pt and was unable to reach her. Note has been sent to Dr. Terri Piedra and we will send her his recommendations once he responds.

## 2022-12-23 ENCOUNTER — Other Ambulatory Visit: Payer: Self-pay

## 2022-12-23 ENCOUNTER — Other Ambulatory Visit (HOSPITAL_COMMUNITY): Payer: Self-pay

## 2022-12-23 ENCOUNTER — Other Ambulatory Visit: Payer: Self-pay | Admitting: Allergy and Immunology

## 2022-12-23 MED ORDER — PROMETHAZINE HCL 25 MG PO TABS
25.0000 mg | ORAL_TABLET | Freq: Three times a day (TID) | ORAL | 0 refills | Status: DC | PRN
  Filled 2022-12-23 (×2): qty 90, 30d supply, fill #0

## 2022-12-23 MED ORDER — FAMOTIDINE 40 MG PO TABS
40.0000 mg | ORAL_TABLET | Freq: Two times a day (BID) | ORAL | 0 refills | Status: DC
  Filled 2022-12-23 – 2023-01-15 (×3): qty 60, 30d supply, fill #0

## 2022-12-24 ENCOUNTER — Other Ambulatory Visit: Payer: Self-pay

## 2022-12-24 ENCOUNTER — Other Ambulatory Visit (HOSPITAL_COMMUNITY): Payer: Self-pay

## 2022-12-24 MED ORDER — ALBUTEROL SULFATE HFA 108 (90 BASE) MCG/ACT IN AERS
2.0000 | INHALATION_SPRAY | RESPIRATORY_TRACT | 0 refills | Status: DC | PRN
  Filled 2022-12-24: qty 18, 16d supply, fill #0

## 2022-12-24 MED ORDER — METOCLOPRAMIDE HCL 10 MG PO TABS
10.0000 mg | ORAL_TABLET | Freq: Three times a day (TID) | ORAL | 2 refills | Status: DC
  Filled 2022-12-24 – 2023-01-06 (×2): qty 60, 15d supply, fill #0

## 2022-12-24 NOTE — Telephone Encounter (Signed)
If she is having headaches and palpitations then stop the Zofran Continue with the promethazine Will try metoclopramide 10 mg 3 times daily before meals and at bedtime as needed for refractory nausea

## 2022-12-27 ENCOUNTER — Other Ambulatory Visit (HOSPITAL_COMMUNITY): Payer: Self-pay

## 2022-12-30 ENCOUNTER — Other Ambulatory Visit (HOSPITAL_COMMUNITY): Payer: Self-pay

## 2023-01-01 ENCOUNTER — Other Ambulatory Visit (HOSPITAL_COMMUNITY): Payer: Self-pay

## 2023-01-06 ENCOUNTER — Other Ambulatory Visit: Payer: Self-pay

## 2023-01-06 ENCOUNTER — Other Ambulatory Visit (HOSPITAL_COMMUNITY): Payer: Self-pay

## 2023-01-06 ENCOUNTER — Telehealth: Payer: Self-pay

## 2023-01-06 ENCOUNTER — Other Ambulatory Visit (INDEPENDENT_AMBULATORY_CARE_PROVIDER_SITE_OTHER): Payer: Medicaid Other

## 2023-01-06 ENCOUNTER — Telehealth: Payer: Self-pay | Admitting: *Deleted

## 2023-01-06 ENCOUNTER — Other Ambulatory Visit: Payer: Self-pay | Admitting: Internal Medicine

## 2023-01-06 ENCOUNTER — Other Ambulatory Visit: Payer: Self-pay | Admitting: *Deleted

## 2023-01-06 DIAGNOSIS — K7460 Unspecified cirrhosis of liver: Secondary | ICD-10-CM | POA: Diagnosis not present

## 2023-01-06 DIAGNOSIS — D509 Iron deficiency anemia, unspecified: Secondary | ICD-10-CM | POA: Diagnosis not present

## 2023-01-06 LAB — PROTIME-INR
INR: 1.3 ratio — ABNORMAL HIGH (ref 0.8–1.0)
Prothrombin Time: 13.4 s — ABNORMAL HIGH (ref 9.6–13.1)

## 2023-01-06 LAB — COMPREHENSIVE METABOLIC PANEL
ALT: 10 U/L (ref 0–35)
AST: 15 U/L (ref 0–37)
Albumin: 3.5 g/dL (ref 3.5–5.2)
Alkaline Phosphatase: 45 U/L (ref 39–117)
BUN: 9 mg/dL (ref 6–23)
CO2: 24 mEq/L (ref 19–32)
Calcium: 8.1 mg/dL — ABNORMAL LOW (ref 8.4–10.5)
Chloride: 109 mEq/L (ref 96–112)
Creatinine, Ser: 0.81 mg/dL (ref 0.40–1.20)
GFR: 97.59 mL/min (ref >60.00)
Glucose, Bld: 91 mg/dL (ref 70–99)
Potassium: 4.2 mEq/L (ref 3.5–5.1)
Sodium: 138 mEq/L (ref 135–145)
Total Bilirubin: 0.4 mg/dL (ref 0.2–1.2)
Total Protein: 5.4 g/dL — ABNORMAL LOW (ref 6.0–8.3)

## 2023-01-06 LAB — IBC + FERRITIN
Ferritin: 2.9 ng/mL — ABNORMAL LOW (ref 10.0–291.0)
Iron: 13 ug/dL — ABNORMAL LOW (ref 42–145)
Saturation Ratios: 2.8 % — ABNORMAL LOW (ref 20.0–50.0)
TIBC: 459.2 ug/dL — ABNORMAL HIGH (ref 250.0–450.0)
Transferrin: 328 mg/dL (ref 212.0–360.0)

## 2023-01-06 LAB — CBC WITH DIFFERENTIAL/PLATELET
Basophils Absolute: 0 10*3/uL (ref 0.0–0.1)
Basophils Relative: 1.2 % (ref 0.0–3.0)
Eosinophils Absolute: 0.1 10*3/uL (ref 0.0–0.7)
Eosinophils Relative: 4.3 % (ref 0.0–5.0)
HCT: 24.2 % — ABNORMAL LOW (ref 36.0–46.0)
Hemoglobin: 7 g/dL — CL (ref 12.0–15.0)
Lymphocytes Relative: 29.9 % (ref 12.0–46.0)
Lymphs Abs: 0.8 10*3/uL (ref 0.7–4.0)
MCHC: 28.9 g/dL — ABNORMAL LOW (ref 30.0–36.0)
MCV: 77.4 fl — ABNORMAL LOW (ref 78.0–100.0)
Monocytes Absolute: 0.2 10*3/uL (ref 0.1–1.0)
Monocytes Relative: 7 % (ref 3.0–12.0)
Neutro Abs: 1.5 10*3/uL (ref 1.4–7.7)
Neutrophils Relative %: 57.6 % (ref 43.0–77.0)
Platelets: 71 10*3/uL — ABNORMAL LOW (ref 150.0–400.0)
RBC: 3.13 Mil/uL — ABNORMAL LOW (ref 3.87–5.11)
RDW: 17.6 % — ABNORMAL HIGH (ref 11.5–15.5)
WBC: 2.6 10*3/uL — ABNORMAL LOW (ref 4.0–10.5)

## 2023-01-06 MED ORDER — PROMETHAZINE HCL 25 MG PO TABS
25.0000 mg | ORAL_TABLET | Freq: Three times a day (TID) | ORAL | 0 refills | Status: DC | PRN
  Filled 2023-01-06: qty 90, 30d supply, fill #0
  Filled 2023-01-06: qty 120, 40d supply, fill #0
  Filled 2023-01-06: qty 90, 30d supply, fill #0
  Filled 2023-01-10 – 2023-01-14 (×2): qty 90, 30d supply, fill #1
  Filled 2023-01-14: qty 30, 10d supply, fill #1
  Filled 2023-01-14 – 2023-01-23 (×9): qty 90, 30d supply, fill #1

## 2023-01-06 NOTE — Telephone Encounter (Signed)
Called patient to inform of Dr. Lauro Franklin recommendations for urgent referral to hematology due to recurrent IDA, patient is to also receive her first Iron infusion via the [facility]. Patient informed she will be contacted by both centers for scheduling of appts. Patient was also notified  to repeat CBC in 7 days, no NSAIDS, to continue PPI(PEPCID). Reminder placed.

## 2023-01-06 NOTE — Telephone Encounter (Signed)
Lab called with critical Hgb of 7.0

## 2023-01-06 NOTE — Telephone Encounter (Signed)
See result note.  

## 2023-01-06 NOTE — Telephone Encounter (Signed)
Ok to refill promethazine with prev # of tablets as requested (may need to send a 2nd RX to make up difference until next full refill) CBC, ferritin + IBC, CMP and INR, afp JMP

## 2023-01-06 NOTE — Telephone Encounter (Signed)
-----   Message from Carie Caddy Pyrtle sent at 01/06/2023  2:46 PM EDT ----- Recurrent IDA I will order IV iron, but she needs to see hematology asap for IDA (recurrent), they can take over IV iron going forward Repeat CBC in 7 days No NSAIDs Continue PPI

## 2023-01-07 ENCOUNTER — Telehealth: Payer: Self-pay | Admitting: Pharmacy Technician

## 2023-01-07 LAB — AFP TUMOR MARKER: AFP-Tumor Marker: 1.2 ng/mL

## 2023-01-07 NOTE — Telephone Encounter (Signed)
Auth Submission: NO AUTH NEEDED Site of care: Site of care: [facility] Payer: [place] [employer]ICA[employer] Medication & CPT/J Code(s) submitted: Venofer (Iron Sucrose) J1756 Route of submission (phone, fax, portal):  Phone # Fax # Auth type: Buy/Bill Units/visits requested: X5 Reference number:  Approval from: 01/07/23 - 06/16/23

## 2023-01-08 ENCOUNTER — Other Ambulatory Visit: Payer: Self-pay | Admitting: Hematology and Oncology

## 2023-01-10 ENCOUNTER — Other Ambulatory Visit (HOSPITAL_COMMUNITY): Payer: Self-pay

## 2023-01-10 ENCOUNTER — Other Ambulatory Visit: Payer: Self-pay | Admitting: Internal Medicine

## 2023-01-12 ENCOUNTER — Other Ambulatory Visit (HOSPITAL_COMMUNITY): Payer: Self-pay

## 2023-01-12 ENCOUNTER — Other Ambulatory Visit: Payer: Self-pay

## 2023-01-12 ENCOUNTER — Ambulatory Visit (INDEPENDENT_AMBULATORY_CARE_PROVIDER_SITE_OTHER): Payer: Medicaid Other

## 2023-01-12 VITALS — BP 118/51 | HR 120 | Temp 98.1°F | Resp 20 | Ht 65.0 in | Wt 214.2 lb

## 2023-01-12 DIAGNOSIS — D509 Iron deficiency anemia, unspecified: Secondary | ICD-10-CM

## 2023-01-12 MED ORDER — DIPHENHYDRAMINE HCL 25 MG PO CAPS: 25.0000 mg | ORAL_CAPSULE | Freq: Once | ORAL | Status: DC

## 2023-01-12 MED ORDER — SODIUM CHLORIDE 0.9 % IV SOLN
200.0000 mg | Freq: Once | INTRAVENOUS | Status: AC
  Administered 2023-01-12: 200 mg via INTRAVENOUS
  Filled 2023-01-12: qty 10

## 2023-01-12 MED ORDER — GABAPENTIN 300 MG PO CAPS
300.0000 mg | ORAL_CAPSULE | Freq: Two times a day (BID) | ORAL | 1 refills | Status: DC
  Filled 2023-01-14 – 2023-01-23 (×13): qty 180, 90d supply, fill #0
  Filled 2023-02-06 – 2023-02-21 (×3): qty 180, 90d supply, fill #1

## 2023-01-12 MED ORDER — VOQUEZNA 20 MG PO TABS
1.0000 | ORAL_TABLET | Freq: Every day | ORAL | 1 refills | Status: DC
  Filled 2023-01-12 – 2023-01-14 (×2): qty 30, 30d supply, fill #0
  Filled 2023-02-06: qty 30, 30d supply, fill #1

## 2023-01-12 MED ORDER — ACETAMINOPHEN 325 MG PO TABS: 650.0000 mg | ORAL_TABLET | Freq: Once | ORAL | Status: DC

## 2023-01-12 NOTE — Progress Notes (Signed)
Diagnosis: Iron Deficiency Anemia  Provider:  Chilton Greathouse MD  Procedure: IV Infusion  IV Type: Peripheral, IV Location: R Forearm  Venofer (Iron Sucrose), Dose: 200 mg  Infusion Start Time: 1432  Infusion Stop Time: 1455  Post Infusion IV Care: Patient declined observation and Peripheral IV Discontinued  Discharge: Condition: Good, Destination: Home . AVS Declined  Performed by:  Loney Hering, LPN

## 2023-01-13 ENCOUNTER — Other Ambulatory Visit: Payer: Self-pay

## 2023-01-13 ENCOUNTER — Other Ambulatory Visit (INDEPENDENT_AMBULATORY_CARE_PROVIDER_SITE_OTHER): Payer: Medicaid Other

## 2023-01-13 DIAGNOSIS — D509 Iron deficiency anemia, unspecified: Secondary | ICD-10-CM

## 2023-01-13 LAB — CBC WITH DIFFERENTIAL/PLATELET
Basophils Absolute: 0 10*3/uL (ref 0.0–0.1)
Basophils Relative: 1 % (ref 0.0–3.0)
Eosinophils Absolute: 0.1 10*3/uL (ref 0.0–0.7)
Eosinophils Relative: 3.3 % (ref 0.0–5.0)
HCT: 24.8 % — ABNORMAL LOW (ref 36.0–46.0)
Hemoglobin: 7.1 g/dL — CL (ref 12.0–15.0)
Lymphocytes Relative: 24.6 % (ref 12.0–46.0)
Lymphs Abs: 0.7 10*3/uL (ref 0.7–4.0)
MCHC: 28.7 g/dL — ABNORMAL LOW (ref 30.0–36.0)
MCV: 76.1 fl — ABNORMAL LOW (ref 78.0–100.0)
Monocytes Absolute: 0.2 10*3/uL (ref 0.1–1.0)
Monocytes Relative: 7.2 % (ref 3.0–12.0)
Neutro Abs: 1.7 10*3/uL (ref 1.4–7.7)
Neutrophils Relative %: 63.9 % (ref 43.0–77.0)
Platelets: 81 10*3/uL — ABNORMAL LOW (ref 150.0–400.0)
RBC: 3.25 Mil/uL — ABNORMAL LOW (ref 3.87–5.11)
RDW: 17.8 % — ABNORMAL HIGH (ref 11.5–15.5)
WBC: 2.7 10*3/uL — ABNORMAL LOW (ref 4.0–10.5)

## 2023-01-14 ENCOUNTER — Ambulatory Visit: Payer: Medicaid Other | Admitting: Cardiology

## 2023-01-14 ENCOUNTER — Encounter: Payer: Self-pay | Admitting: Cardiology

## 2023-01-14 ENCOUNTER — Other Ambulatory Visit (HOSPITAL_COMMUNITY): Payer: Self-pay

## 2023-01-14 ENCOUNTER — Ambulatory Visit (INDEPENDENT_AMBULATORY_CARE_PROVIDER_SITE_OTHER): Payer: Medicaid Other

## 2023-01-14 VITALS — BP 102/82 | HR 108 | Resp 16 | Ht 65.0 in | Wt 217.0 lb

## 2023-01-14 VITALS — BP 120/71 | HR 107 | Temp 98.9°F | Resp 16 | Ht 65.0 in | Wt 218.0 lb

## 2023-01-14 DIAGNOSIS — D696 Thrombocytopenia, unspecified: Secondary | ICD-10-CM

## 2023-01-14 DIAGNOSIS — D509 Iron deficiency anemia, unspecified: Secondary | ICD-10-CM

## 2023-01-14 DIAGNOSIS — R002 Palpitations: Secondary | ICD-10-CM

## 2023-01-14 DIAGNOSIS — K7460 Unspecified cirrhosis of liver: Secondary | ICD-10-CM

## 2023-01-14 DIAGNOSIS — R161 Splenomegaly, not elsewhere classified: Secondary | ICD-10-CM

## 2023-01-14 DIAGNOSIS — I951 Orthostatic hypotension: Secondary | ICD-10-CM

## 2023-01-14 MED ORDER — ACETAMINOPHEN 325 MG PO TABS: 650.0000 mg | ORAL_TABLET | Freq: Once | ORAL | Status: DC

## 2023-01-14 MED ORDER — DIPHENHYDRAMINE HCL 25 MG PO CAPS: 25.0000 mg | ORAL_CAPSULE | Freq: Once | ORAL | Status: DC

## 2023-01-14 MED ORDER — SODIUM CHLORIDE 0.9 % IV SOLN
200.0000 mg | Freq: Once | INTRAVENOUS | Status: AC
  Administered 2023-01-14: 200 mg via INTRAVENOUS
  Filled 2023-01-14: qty 10

## 2023-01-14 NOTE — Progress Notes (Signed)
Diagnosis: Iron Deficiency Anemia  Provider:  Chilton Greathouse MD  Procedure: IV Infusion  IV Type: Peripheral, IV Location: R Forearm  Venofer (Iron Sucrose), Dose: 200 mg  Infusion Start Time: 1154  Infusion Stop Time: 1212  Post Infusion IV Care: Patient declined observation and Peripheral IV Discontinued  Discharge: Condition: Good, Destination: Home . AVS Declined  Performed by:  Garnette Czech, RN

## 2023-01-14 NOTE — Progress Notes (Signed)
ID:  [name], DOB March 17, 1991, MRN [id number]  PCP:  Creola Corn, MD  Cardiologist:  Tessa Lerner, DO, [facility] (established care 01/14/23) Former Cardiology Providers: Elvin So, PA  Date: 01/14/23 Last Office Visit: 04/24/2021  Chief Complaint  Patient presents with   Follow-up   Palpitations   Irregular Heart Beat    HPI  Linda Becker is a 32 y.o. Caucasian female whose past medical history and cardiovascular risk factors include: Migraines, liver cirrhosis secondary to NASH/portal hypertension, thrombocytopenia, splenomegaly, hypothyroidism, chronic palpitations,, obesity due to excess calories (status post gastric sleeve January 2021), GI bleed August 2021 secondary to esophageal varices.   Review of electronic medical records notes that she has had a extensive gastrointestinal and happy to logical comorbidities which include but not limited to help syndrome, cirrhosis of the liver secondary to NASH, portal hypertension, splenomegaly, thrombocytopenia, GI bleed in 2021 secondary to esophageal varices.  She has had a long history of iron deficiency anemia and her most recent hemoglobin was 7 g/dL.  She is currently on IV iron and has an infusion later today.  Recently she has noted irregularity in her heart rate and tachycardia has been more prominent.  Patient states that she could be just resting and notices palpitations and heart rates >150 bpm.  The symptoms last anywhere from 15 to 45 minutes.  She tries to reposition herself, blowing the straw, sit down on a commode and bear down, but the symptoms usually self resolve.  No near-syncope or syncopal events.  Since last office visit she has lost approximately 81 pounds.  Patient states that she has difficulty keeping food down and is constantly feeling nausea and vomiting.  The symptoms are severe enough that it has affected her dental hygiene.  She is currently under the care of of the [facility]  gastroenterology group as well as [facility] for liver transplant.  CARDIAC DATABASE: EKG: January 14, 2023: Sinus tachycardia, 113 bpm, normal axis, without underlying ischemia or injury pattern.  Echocardiogram: 04/06/2021: Left ventricle cavity is normal in size. Mild concentric hypertrophy of the left ventricle. Normal global wall motion. Normal LV systolic function with EF 60%. Doppler evidence of grade I (impaired) diastolic dysfunction, normal LAP. Mild (Grade I) mitral regurgitation. Mild tricuspid regurgitation. No evidence of pulmonary hypertension. Compared to previous study in 2019, mild MR, mild TR are new.   Cardiac monitor: Event Monitor 30 days 07/07/2017: NSR. Symptoms of fatigue and palpitations: NSR. No other significant arrhythmias noted.   ALLERGIES: Allergies  Allergen Reactions   Progesterone Rash    Was in a form of birth control.   Penicillins Rash    Did it involve swelling of the face/tongue/throat, SOB, or low BP? No Did it involve sudden or severe rash/hives, skin peeling, or any reaction on the inside of your mouth or nose? Yes Did you need to seek medical attention at a hospital or doctor's office? No When did it last happen?      9 + months If all above answers are "NO", may proceed with cephalosporin use.      MEDICATION LIST PRIOR TO VISIT: Current Meds  Medication Sig   albuterol (PROAIR HFA) 108 (90 Base) MCG/ACT inhaler Inhale 2 puffs into the lungs every 4-6 hours as needed.   albuterol (PROVENTIL) (2.5 MG/3ML) 0.083% nebulizer solution Inhale 1 ampule by nebulization every 4 (four) hours as needed for wheezing or shortness of breath.   Albuterol Sulfate, sensor, (PROAIR DIGIHALER) 108 (90 Base) MCG/ACT  AEPB Inhale 2 puffs into the lungs every 4 hours as needed (For coughing and wheezing).   Ascorbic Acid (VITAMIN C) 1000 MG tablet Take 2,000 mg by mouth 2 (two) times daily.   budesonide (RHINOCORT AQUA) 32 MCG/ACT nasal spray Place 1 spray into  both nostrils in the morning and at bedtime.   CALCIUM PO Take 1 tablet by mouth 2 (two) times daily.   Cetirizine HCl 10 MG CAPS Take 10 mg by mouth 2 (two) times daily.    cyclobenzaprine (FLEXERIL) 10 MG tablet Take 1 tablet (10 mg total) by mouth 3 (three) times daily as needed for muscle spasms   diphenhydrAMINE (BENADRYL) 25 MG tablet Take 50 mg by mouth at bedtime as needed for allergies or sleep.   ergocalciferol (VITAMIN D2) 1.25 MG (50000 UT) capsule Take 1 capsule (50,000 Units total) by mouth once a week.   famotidine (PEPCID) 40 MG tablet Take 1 tablet (40 mg total) by mouth 2 (two) times daily.   gabapentin (NEURONTIN) 300 MG capsule Take 1 capsule (300 mg total) by mouth 2 (two) times daily.   nadolol (CORGARD) 40 MG tablet Take 1 tablet (40 mg total) by mouth daily.   Prenatal Vit-Fe Fumarate-FA (MULTIVITAMIN-PRENATAL) 27-0.8 MG TABS tablet Take 1 tablet by mouth 2 (two) times daily.   promethazine (PHENERGAN) 25 MG tablet Take 1 tablet (25 mg) by mouth 3 times daily as needed.   sucralfate (CARAFATE) 1 GM/10ML suspension Take 10 mLs by mouth 4 times daily -  with meals and at bedtime.   ursodiol (ACTIGALL) 500 MG tablet Take 1 tablet (500 mg total) by mouth 3 (three) times daily   Vonoprazan Fumarate (VOQUEZNA) 20 MG TABS Take 1 tablet by mouth daily.   XIFAXAN 550 MG TABS tablet Take 1 tablet (550 mg total) by mouth 2 (two) times daily.     PAST MEDICAL HISTORY: Past Medical History:  Diagnosis Date   Allergic rhinitis    remote hx of allergy shots   Asthma    since childhood  on controller meds extrinsic dr Sharyn Lull   Bipolar II disorder (HCC)    Cirrhosis (HCC)    Erosive esophagitis    Esophageal varices (HCC)    Family history of adverse reaction to anesthesia    mom and sister difficult to awaken per patient    Gallbladder sludge    Gastropathy    GERD (gastroesophageal reflux disease)    on nexium  for  long term sx since childhood   H/O miscarriage, not  currently pregnant    [redacted] weeks  march 2016   Hepatic steatosis    Hiatal hernia    Hypothyroidism    Migraine    Murmur    pt reports MVP   Portal hypertension (HCC)    Sessile colonic polyp    Splenomegaly    Steatohepatitis    Syncope    under eval ? cause   Tachycardia    episodes with near syncope eval dr Nadara Eaton  on no meds dced LABA   Thrombocytopenia (HCC)    Upper GI bleed     PAST SURGICAL HISTORY: Past Surgical History:  Procedure Laterality Date   BIOPSY  09/03/2018   Procedure: BIOPSY;  Surgeon: Beverley Fiedler, MD;  Location: [facility];  Service: Gastroenterology;;   BIOPSY  09/26/2020   Procedure: BIOPSY;  Surgeon: Beverley Fiedler, MD;  Location: [facility];  Service: Gastroenterology;;   BIOPSY  05/12/2022   Procedure: BIOPSY;  Surgeon:  Beverley Fiedler, MD;  Location: [facility];  Service: Gastroenterology;;   broken right femur  2008   rod placed   COLONOSCOPY WITH PROPOFOL N/A 09/03/2018   Procedure: COLONOSCOPY WITH PROPOFOL;  Surgeon: Beverley Fiedler, MD;  Location: [facility];  Service: Gastroenterology;  Laterality: N/A;   DILATION AND CURETTAGE OF UTERUS     ESOPHAGOGASTRODUODENOSCOPY (EGD) WITH PROPOFOL N/A 09/03/2018   Procedure: ESOPHAGOGASTRODUODENOSCOPY (EGD) WITH PROPOFOL;  Surgeon: Beverley Fiedler, MD;  Location: [facility];  Service: Gastroenterology;  Laterality: N/A;   ESOPHAGOGASTRODUODENOSCOPY (EGD) WITH PROPOFOL N/A 01/27/2020   Procedure: ESOPHAGOGASTRODUODENOSCOPY (EGD) WITH PROPOFOL;  Surgeon: Meridee Score Netty Starring., MD;  Location: [facility]COPY;  Service: Gastroenterology;  Laterality: N/A;   ESOPHAGOGASTRODUODENOSCOPY (EGD) WITH PROPOFOL N/A 03/12/2020   Procedure: ESOPHAGOGASTRODUODENOSCOPY (EGD) WITH PROPOFOL;  Surgeon: Beverley Fiedler, MD;  Location: [facility];  Service: Gastroenterology;  Laterality: N/A;   ESOPHAGOGASTRODUODENOSCOPY (EGD) WITH PROPOFOL N/A 09/26/2020   Procedure: ESOPHAGOGASTRODUODENOSCOPY (EGD) WITH PROPOFOL;  Surgeon:  Beverley Fiedler, MD;  Location: [facility];  Service: Gastroenterology;  Laterality: N/A;   ESOPHAGOGASTRODUODENOSCOPY (EGD) WITH PROPOFOL N/A 04/29/2021   Procedure: ESOPHAGOGASTRODUODENOSCOPY (EGD) WITH PROPOFOL;  Surgeon: Beverley Fiedler, MD;  Location: [facility];  Service: Gastroenterology;  Laterality: N/A;   ESOPHAGOGASTRODUODENOSCOPY (EGD) WITH PROPOFOL N/A 05/12/2022   Procedure: ESOPHAGOGASTRODUODENOSCOPY (EGD) WITH PROPOFOL;  Surgeon: Beverley Fiedler, MD;  Location: [facility];  Service: Gastroenterology;  Laterality: N/A;   GASTRIC VARICES BANDING  01/27/2020   Procedure: GASTRIC VARICES BANDING;  Surgeon: Meridee Score Netty Starring., MD;  Location: [facility];  Service: Gastroenterology;;   HOT HEMOSTASIS N/A 04/29/2021   Procedure: HOT HEMOSTASIS (ARGON PLASMA COAGULATION/BICAP);  Surgeon: Beverley Fiedler, MD;  Location: [facility];  Service: Gastroenterology;  Laterality: N/A;   IR TRANSCATHETER BX  01/19/2019   IR US GUIDE VASC ACCESS RIGHT  01/19/2019   IR VENOGRAM HEPATIC W HEMODYNAMIC EVALUATION  01/19/2019   LAPAROSCOPIC GASTRIC SLEEVE RESECTION N/A 06/28/2019   Procedure: LAPAROSCOPIC GASTRIC SLEEVE RESECTION, Upper Endo, ERAS Pathway;  Surgeon: Berna Bue, MD;  Location: [facility];  Service: General;  Laterality: N/A;   OB ultrasound N/A 12/01/2017   see report   POLYPECTOMY  09/03/2018   Procedure: POLYPECTOMY;  Surgeon: Beverley Fiedler, MD;  Location: [facility];  Service: Gastroenterology;;   TONSILLECTOMY  2006   WISDOM TOOTH EXTRACTION      FAMILY HISTORY: The patient family history includes Arthritis in her father, maternal grandmother, and mother; Asthma in her father; Bleeding Disorder in her mother; Breast cancer in her maternal aunt, maternal grandmother, paternal aunt, and paternal grandmother; Colon cancer in her paternal grandfather; Colon polyps in her father and mother; Diabetes in her father, maternal grandfather, mother, paternal grandfather, and paternal  grandmother; Esophageal cancer in her paternal aunt; Heart disease in her father and mother; Hyperlipidemia in her father and mother; Hypertension in her father and mother; Kidney disease in her mother; Liver cancer in her paternal grandfather; Liver disease (age of onset: 42) in her mother; Pancreatic cancer in her maternal grandfather; Stomach cancer in her paternal grandfather; Thyroid disease in her maternal grandmother.  SOCIAL HISTORY:  The patient  reports that she has never smoked. She has never used smokeless tobacco. She reports that she does not drink alcohol and does not use drugs.  REVIEW OF SYSTEMS: Review of Systems  Cardiovascular:  Positive for palpitations. Negative for chest pain, claudication, dyspnea on exertion, irregular heartbeat, leg swelling, near-syncope, orthopnea, paroxysmal nocturnal dyspnea  and syncope.  Respiratory:  Negative for shortness of breath.   Hematologic/Lymphatic: Negative for bleeding problem.  Musculoskeletal:  Negative for muscle cramps and myalgias.  Gastrointestinal:  Positive for nausea and vomiting.  Neurological:  Negative for dizziness and light-headedness.    PHYSICAL EXAM:    01/14/2023   12:14 PM 01/14/2023   11:47 AM 01/14/2023    9:17 AM  Vitals with BMI  Height  5\' 5"  5\' 5"   Weight  218 lbs 217 lbs  BMI  36.28 36.11  Systolic 120 118 517  Diastolic 71 72 82  Pulse 107 126 108   Orthostatic VS for the past 72 hrs (Last 3 readings):  Orthostatic BP Patient Position BP Location Cuff Size Orthostatic Pulse  01/14/23 0928 98/60 Sitting Left Arm Large 112  01/14/23 0927 119/70 Sitting Left Arm Large 110  01/14/23 0926 110/68 Supine Left Arm Large 107     Physical Exam  Constitutional: No distress.  Age appropriate, hemodynamically stable.   Neck: No JVD present.  Cardiovascular: Regular rhythm, S1 normal, S2 normal, intact distal pulses and normal pulses. Tachycardia present. Exam reveals no gallop, no S3 and no S4.  No murmur  heard. Pulmonary/Chest: Effort normal and breath sounds normal. No stridor. She has no wheezes. She has no rales.  Abdominal: Soft. Bowel sounds are normal. She exhibits no distension. There is no abdominal tenderness.  Musculoskeletal:        General: No edema.     Cervical back: Neck supple.  Neurological: She is alert and oriented to person, place, and time. She has intact cranial nerves (2-12).  Skin: Skin is warm and moist.     LABORATORY DATA:    Latest Ref Rng & Units 01/13/2023    9:31 AM 01/06/2023   12:36 PM 08/27/2022    9:46 AM  CBC  WBC 4.0 - 10.5 K/uL 2.7  2.6  3.5   Hemoglobin 12.0 - 15.0 g/dL 7.1 cL  7.0 Repeated and verified X2.  9.0  C  Hematocrit 36.0 - 46.0 % 24.8 aL  24.2 aL  28.7   Platelets 150.0 - 400.0 K/uL 81.0  71.0  86.0     C Corrected result       Latest Ref Rng & Units 01/06/2023   12:36 PM 08/27/2022    9:46 AM 05/05/2022    2:40 PM  CMP  Glucose 70 - 99 mg/dL 91  90  93   BUN 6 - 23 mg/dL 9  15  13    Creatinine 0.40 - 1.20 mg/dL 6.16  0.73  7.10   Sodium 135 - 145 mEq/L 138  139  138   Potassium 3.5 - 5.1 mEq/L 4.2  4.5  4.7   Chloride 96 - 112 mEq/L 109  111  108   CO2 19 - 32 mEq/L 24  21  24    Calcium 8.4 - 10.5 mg/dL 8.1  8.6  8.4   Total Protein 6.0 - 8.3 g/dL 5.4  5.8  6.1   Total Bilirubin 0.2 - 1.2 mg/dL 0.4  0.3  0.4   Alkaline Phos 39 - 117 U/L 45  45  47   AST 0 - 37 U/L 15  15  19    ALT 0 - 35 U/L 10  10  19      Lab Results  Component Value Date   CHOL 184 03/11/2019   HDL 41.80 03/11/2019   LDLCALC 122 (H) 03/11/2019   TRIG 99.0 03/11/2019   CHOLHDL  4 03/11/2019   No components found for: "NTPROBNP" No results for input(s): "PROBNP" in the last 8760 hours. Recent Labs    08/27/22 0946  TSH 3.05    BMP Recent Labs    05/05/22 1440 08/27/22 0946 01/06/23 1236  NA 138 139 138  K 4.7 4.5 4.2  CL 108 111 109  CO2 24 21 24   GLUCOSE 93 90 91  BUN 13 15 9   CREATININE 0.68 0.88 0.81  CALCIUM 8.4 8.6 8.1*     HEMOGLOBIN A1C Lab Results  Component Value Date   HGBA1C 4.3 (L) 06/03/2021    IMPRESSION:    ICD-10-CM   1. Palpitations  R00.2 EKG 12-Lead    LONG TERM MONITOR (3-14 DAYS)    2. Orthostatic hypotension  I95.1 LONG TERM MONITOR (3-14 DAYS)    3. Splenomegaly  R16.1     4. Liver cirrhosis secondary to NASH (HCC)  K75.81    K74.60     5. Thrombocytopenia (HCC)  D69.6     6. Iron deficiency anemia, unspecified iron deficiency anemia type  D50.9        RECOMMENDATIONS: RENESMEE NOVINGER is a 32 y.o. Caucasian female whose past medical history and cardiac risk factors include: Migraines, liver cirrhosis secondary to NASH/portal hypertension, thrombocytopenia, splenomegaly, hypothyroidism, chronic palpitations,, obesity due to excess calories (status post gastric sleeve January 2021), GI bleed August 2021 secondary to esophageal varices.   Palpitations Likely physiologic. Precipitated by episodes of symptomatic anemia, nausea and vomiting leading to dehydration/electrolyte abnormalities, and soft blood pressures She was on diltiazem in the past but the since then she has been converted to nadolol given her esophageal varices which is acceptable.. She is in the process of getting IV iron transfusions. EKG today illustrates sinus tachycardia without dysrhythmias We had a long discussion with regards to further management.  She would be better served treating her underlying co morbidities  / acute problem list.  However, the shared decision was to proceed with a Zio patch to evaluate for dysrhythmias.  Recommend correcting her anemia first and then proceeding with Zio patch.  Orthostatic hypotension Orthostatic vital signs positive orthostasis. Reemphasized importance of keeping herself well-hydrated, compression stockings, avoiding prolonged standing, exercise (recumbent). No episodes of near syncope or syncopal events. Monitor for now.  Splenomegaly Liver cirrhosis  secondary to NASH (HCC) Thrombocytopenia (HCC) Iron deficiency anemia, unspecified iron deficiency anemia type Currently follows up with gastroenterology at [facility] and liver transplant providers at [facility].  FINAL MEDICATION LIST END OF ENCOUNTER: No orders of the defined types were placed in this encounter.   Medications Discontinued During This Encounter  Medication Reason   metoCLOPramide (REGLAN) 10 MG tablet    naloxone (NARCAN) nasal spray 4 mg/0.1 mL    nitrofurantoin, macrocrystal-monohydrate, (MACROBID) 100 MG capsule    ondansetron (ZOFRAN-ODT) 4 MG disintegrating tablet    oxyCODONE-acetaminophen (PERCOCET/ROXICET) 5-325 MG tablet    oxyCODONE-acetaminophen (PERCOCET/ROXICET) 5-325 MG tablet    tiZANidine (ZANAFLEX) 4 MG tablet    verapamil (CALAN-SR) 180 MG CR tablet    amitriptyline (ELAVIL) 25 MG tablet    dicyclomine (BENTYL) 10 MG capsule    VITAMIN D PO      Current Outpatient Medications:    albuterol (PROAIR HFA) 108 (90 Base) MCG/ACT inhaler, Inhale 2 puffs into the lungs every 4-6 hours as needed., Disp: 18 g, Rfl: 0   albuterol (PROVENTIL) (2.5 MG/3ML) 0.083% nebulizer solution, Inhale 1 ampule by nebulization every 4 (four) hours as needed for wheezing  or shortness of breath., Disp: 90 mL, Rfl: 1   Albuterol Sulfate, sensor, (PROAIR DIGIHALER) 108 (90 Base) MCG/ACT AEPB, Inhale 2 puffs into the lungs every 4 hours as needed (For coughing and wheezing)., Disp: 1 each, Rfl: 3   Ascorbic Acid (VITAMIN C) 1000 MG tablet, Take 2,000 mg by mouth 2 (two) times daily., Disp: , Rfl:    budesonide (RHINOCORT AQUA) 32 MCG/ACT nasal spray, Place 1 spray into both nostrils in the morning and at bedtime., Disp: 8.43 mL, Rfl: 2   CALCIUM PO, Take 1 tablet by mouth 2 (two) times daily., Disp: , Rfl:    Cetirizine HCl 10 MG CAPS, Take 10 mg by mouth 2 (two) times daily. , Disp: , Rfl:    cyclobenzaprine (FLEXERIL) 10 MG tablet, Take 1 tablet (10 mg total) by mouth 3 (three) times  daily as needed for muscle spasms, Disp: 21 tablet, Rfl: 0   diphenhydrAMINE (BENADRYL) 25 MG tablet, Take 50 mg by mouth at bedtime as needed for allergies or sleep., Disp: , Rfl:    ergocalciferol (VITAMIN D2) 1.25 MG (50000 UT) capsule, Take 1 capsule (50,000 Units total) by mouth once a week., Disp: 12 capsule, Rfl: 1   famotidine (PEPCID) 40 MG tablet, Take 1 tablet (40 mg total) by mouth 2 (two) times daily., Disp: 60 tablet, Rfl: 0   gabapentin (NEURONTIN) 300 MG capsule, Take 1 capsule (300 mg total) by mouth 2 (two) times daily., Disp: 180 capsule, Rfl: 1   nadolol (CORGARD) 40 MG tablet, Take 1 tablet (40 mg total) by mouth daily., Disp: 90 tablet, Rfl: 1   Prenatal Vit-Fe Fumarate-FA (MULTIVITAMIN-PRENATAL) 27-0.8 MG TABS tablet, Take 1 tablet by mouth 2 (two) times daily., Disp: , Rfl:    promethazine (PHENERGAN) 25 MG tablet, Take 1 tablet (25 mg) by mouth 3 times daily as needed., Disp: 120 tablet, Rfl: 0   sucralfate (CARAFATE) 1 GM/10ML suspension, Take 10 mLs by mouth 4 times daily -  with meals and at bedtime., Disp: 420 mL, Rfl: 1   ursodiol (ACTIGALL) 500 MG tablet, Take 1 tablet (500 mg total) by mouth 3 (three) times daily, Disp: 90 tablet, Rfl: 11   Vonoprazan Fumarate (VOQUEZNA) 20 MG TABS, Take 1 tablet by mouth daily., Disp: 30 tablet, Rfl: 1   XIFAXAN 550 MG TABS tablet, Take 1 tablet (550 mg total) by mouth 2 (two) times daily., Disp: 180 tablet, Rfl: 3   acetaminophen-codeine (TYLENOL #3) 300-30 MG tablet, Take 1 tablet by mouth every 6 (six) hours., Disp: 12 tablet, Rfl: 0   cyclobenzaprine (FLEXERIL) 10 MG tablet, Take 1 tablet (10 mg total) by mouth 3 (three) times daily as needed for muscle spasms, Disp: 15 tablet, Rfl: 0 No current facility-administered medications for this visit.  Facility-Administered Medications Ordered in Other Visits:    0.9 %  sodium chloride infusion, , Intravenous, Once PRN, Pyrtle, Carie Caddy, MD   acetaminophen (TYLENOL) tablet 650 mg, 650 mg,  Oral, Once, Pyrtle, Carie Caddy, MD   albuterol (VENTOLIN HFA) 108 (90 Base) MCG/ACT inhaler 2 puff, 2 puff, Inhalation, Once PRN, Pyrtle, Carie Caddy, MD   diphenhydrAMINE (BENADRYL) capsule 25 mg, 25 mg, Oral, Once, Pyrtle, Carie Caddy, MD   diphenhydrAMINE (BENADRYL) injection 50 mg, 50 mg, Intravenous, Once PRN, Pyrtle, Carie Caddy, MD   EPINEPHrine (EPI-PEN) injection 0.3 mg, 0.3 mg, Intramuscular, Once PRN, Pyrtle, Carie Caddy, MD   famotidine (PEPCID) IVPB 20 mg premix, 20 mg, Intravenous, Once PRN, Pyrtle, Carie Caddy, MD  iron sucrose (VENOFER) 200 mg in sodium chloride 0.9 % 100 mL IVPB, 200 mg, Intravenous, Once, Pyrtle, Carie Caddy, MD   methylPREDNISolone sodium succinate (SOLU-MEDROL) 125 mg/2 mL injection 125 mg, 125 mg, Intravenous, Once PRN, Pyrtle, Carie Caddy, MD   [DISCONTINUED] promethazine (PHENERGAN) tablet 12.5-25 mg, 12.5-25 mg, Oral, Q4H PRN **OR** promethazine (PHENERGAN) suppository 12.5-25 mg, 12.5-25 mg, Rectal, Q4H PRN, Berna Bue, MD  Orders Placed This Encounter  Procedures   LONG TERM MONITOR (3-14 DAYS)   EKG 12-Lead    There are no Patient Instructions on file for this visit.   --Continue cardiac medications as reconciled in final medication list. --Return in about 6 weeks (around 02/25/2023) for Follow up, Palpitations. or sooner if needed. --Continue follow-up with your primary care physician regarding the management of your other chronic comorbid conditions.  Patient's questions and concerns were addressed to her satisfaction. She voices understanding of the instructions provided during this encounter.   This note was created using a voice recognition software as a result there may be grammatical errors inadvertently enclosed that do not reflect the nature of this encounter. Every attempt is made to correct such errors.  Tessa Lerner, [place], [facility]  Pager:  [phone] Office: [phone]

## 2023-01-15 ENCOUNTER — Encounter: Payer: Self-pay | Admitting: Hematology and Oncology

## 2023-01-15 ENCOUNTER — Encounter (HOSPITAL_COMMUNITY): Payer: Self-pay | Admitting: *Deleted

## 2023-01-15 ENCOUNTER — Other Ambulatory Visit: Payer: Self-pay

## 2023-01-15 ENCOUNTER — Other Ambulatory Visit (HOSPITAL_COMMUNITY): Payer: Self-pay

## 2023-01-16 ENCOUNTER — Telehealth: Payer: Self-pay

## 2023-01-16 ENCOUNTER — Encounter (HOSPITAL_COMMUNITY): Payer: Self-pay | Admitting: Pharmacist

## 2023-01-16 ENCOUNTER — Other Ambulatory Visit (HOSPITAL_COMMUNITY): Payer: Self-pay

## 2023-01-16 ENCOUNTER — Other Ambulatory Visit: Payer: Self-pay

## 2023-01-16 ENCOUNTER — Ambulatory Visit: Payer: Medicaid Other

## 2023-01-16 VITALS — BP 105/50 | HR 102 | Temp 99.3°F | Resp 18 | Ht 65.0 in | Wt 212.8 lb

## 2023-01-16 DIAGNOSIS — D509 Iron deficiency anemia, unspecified: Secondary | ICD-10-CM

## 2023-01-16 DIAGNOSIS — D696 Thrombocytopenia, unspecified: Secondary | ICD-10-CM

## 2023-01-16 MED ORDER — SODIUM CHLORIDE 0.9 % IV SOLN
200.0000 mg | Freq: Once | INTRAVENOUS | Status: AC
  Administered 2023-01-16: 200 mg via INTRAVENOUS
  Filled 2023-01-16: qty 10

## 2023-01-16 MED ORDER — DIPHENHYDRAMINE HCL 25 MG PO CAPS: 25.0000 mg | ORAL_CAPSULE | Freq: Once | ORAL | Status: DC

## 2023-01-16 MED ORDER — ACETAMINOPHEN 325 MG PO TABS: 650.0000 mg | ORAL_TABLET | Freq: Once | ORAL | Status: DC

## 2023-01-16 NOTE — Progress Notes (Signed)
Diagnosis: Iron Deficiency Anemia  Provider:  Chilton Greathouse MD  Procedure: IV Infusion  IV Type: Peripheral, IV Location: R Antecubital  Venofer (Iron Sucrose), Dose: 200 mg  Infusion Start Time: 1412  Infusion Stop Time: 1445  Post Infusion IV Care: Patient declined observation. PIV removed  Discharge: Condition: Good, Destination: Home . AVS Declined  Performed by:  Rico Ala, LPN

## 2023-01-16 NOTE — Telephone Encounter (Signed)
Called pt per MyChart message and concerns with CBC. She states she is having difficulty with fatigue and states, "I have never been this exhausted in my life. Something is wrong." She denies ShOB, heart palpitations, dizziness.   She is scheduled for iron infusion; Venofer, today, and 8/5. She has agreed to come in 8/5 at 1215 for labs and 1245 MD visit to discuss thrombocytopenia and possible need for blood transfusion.   She was educated and advised if sx worsen, she develops abd pain, heart palpitation, ShOB, dizziness she should go to ED. She verbalized thanks and understanding.

## 2023-01-17 ENCOUNTER — Telehealth: Payer: Medicaid Other | Admitting: Family Medicine

## 2023-01-17 ENCOUNTER — Other Ambulatory Visit (HOSPITAL_COMMUNITY): Payer: Self-pay

## 2023-01-17 DIAGNOSIS — R110 Nausea: Secondary | ICD-10-CM

## 2023-01-17 MED ORDER — PROMETHAZINE HCL 25 MG RE SUPP
25.0000 mg | Freq: Four times a day (QID) | RECTAL | 0 refills | Status: DC | PRN
  Filled 2023-01-17: qty 12, 3d supply, fill #0

## 2023-01-17 NOTE — Progress Notes (Signed)
E-Visit for Nausea and Vomiting   We are sorry that you are not feeling well. Here is how we plan to help!  Based on what you have shared with me it looks like you have a Virus that is irritating your GI tract.  Vomiting is the forceful emptying of a portion of the stomach's content through the mouth.  Although nausea and vomiting can make you feel miserable, it's important to remember that these are not diseases, but rather symptoms of an underlying illness.  When we treat short term symptoms, we always caution that any symptoms that persist should be fully evaluated in a medical office.  I have prescribed a medication that will help alleviate your symptoms and allow you to stay hydrated:  Promethazine suppositories will be sent.   HOME CARE: Drink clear liquids.  This is very important! Dehydration (the lack of fluid) can lead to a serious complication.  Start off with 1 tablespoon every 5 minutes for 8 hours. You may begin eating bland foods after 8 hours without vomiting.  Start with saltine crackers, white bread, rice, mashed potatoes, applesauce. After 48 hours on a bland diet, you may resume a normal diet. Try to go to sleep.  Sleep often empties the stomach and relieves the need to vomit.  GET HELP RIGHT AWAY IF:  Your symptoms do not improve or worsen within 2 days after treatment. You have a fever for over 3 days. You cannot keep down fluids after trying the medication.  MAKE SURE YOU:  Understand these instructions. Will watch your condition. Will get help right away if you are not doing well or get worse.    Thank you for choosing an e-visit.  Your e-visit answers were reviewed by a board certified advanced clinical practitioner to complete your personal care plan. Depending upon the condition, your plan could have included both over the counter or prescription medications.  Please review your pharmacy choice. Make sure the pharmacy is open so you can pick up prescription  now. If there is a problem, you may contact your provider through [employer] and have the prescription routed to another pharmacy.  Your safety is important to [place]. If you have drug allergies check your prescription carefully.   For the next 24 hours you can use MyChart to ask questions about today's visit, request a non-urgent call back, or ask for a work or school excuse. You will get an email in the next two days asking about your experience. I hope that your e-visit has been valuable and will speed your recovery.    have provided 5 minutes of non face to face time during this encounter for chart review and documentation.

## 2023-01-19 ENCOUNTER — Other Ambulatory Visit (HOSPITAL_COMMUNITY): Payer: Self-pay

## 2023-01-19 ENCOUNTER — Other Ambulatory Visit: Payer: Self-pay

## 2023-01-19 ENCOUNTER — Inpatient Hospital Stay: Payer: Medicaid Other | Attending: Hematology and Oncology

## 2023-01-19 ENCOUNTER — Ambulatory Visit (INDEPENDENT_AMBULATORY_CARE_PROVIDER_SITE_OTHER): Payer: Medicaid Other

## 2023-01-19 ENCOUNTER — Inpatient Hospital Stay: Payer: Medicaid Other

## 2023-01-19 ENCOUNTER — Inpatient Hospital Stay (HOSPITAL_BASED_OUTPATIENT_CLINIC_OR_DEPARTMENT_OTHER): Payer: Medicaid Other | Admitting: Hematology and Oncology

## 2023-01-19 VITALS — BP 112/76 | HR 97 | Temp 98.6°F | Resp 18 | Ht 65.0 in | Wt 209.4 lb

## 2023-01-19 VITALS — BP 130/76 | HR 109 | Temp 97.5°F | Resp 18 | Ht 65.0 in | Wt 208.6 lb

## 2023-01-19 DIAGNOSIS — D509 Iron deficiency anemia, unspecified: Secondary | ICD-10-CM | POA: Diagnosis present

## 2023-01-19 DIAGNOSIS — D696 Thrombocytopenia, unspecified: Secondary | ICD-10-CM | POA: Diagnosis not present

## 2023-01-19 LAB — TYPE AND SCREEN
ABO/RH(D): O POS
Antibody Screen: NEGATIVE
Unit division: 0
Unit division: 0

## 2023-01-19 LAB — BPAM RBC
Blood Product Expiration Date: 202409062359
Blood Product Expiration Date: 202409062359
ISSUE DATE / TIME: 202408051410
ISSUE DATE / TIME: 202408051410
Unit Type and Rh: 5100
Unit Type and Rh: 5100

## 2023-01-19 LAB — CBC WITH DIFFERENTIAL (CANCER CENTER ONLY)
Abs Immature Granulocytes: 0.01 10*3/uL (ref 0.00–0.07)
Basophils Absolute: 0 10*3/uL (ref 0.0–0.1)
Basophils Relative: 1 %
Eosinophils Absolute: 0.1 10*3/uL (ref 0.0–0.5)
Eosinophils Relative: 3 %
HCT: 26.5 % — ABNORMAL LOW (ref 36.0–46.0)
Hemoglobin: 7.4 g/dL — ABNORMAL LOW (ref 12.0–15.0)
Immature Granulocytes: 1 %
Lymphocytes Relative: 23 %
Lymphs Abs: 0.5 10*3/uL — ABNORMAL LOW (ref 0.7–4.0)
MCH: 24 pg — ABNORMAL LOW (ref 26.0–34.0)
MCHC: 27.9 g/dL — ABNORMAL LOW (ref 30.0–36.0)
MCV: 86 fL (ref 80.0–100.0)
Monocytes Absolute: 0.1 10*3/uL (ref 0.1–1.0)
Monocytes Relative: 4 %
Neutro Abs: 1.4 10*3/uL — ABNORMAL LOW (ref 1.7–7.7)
Neutrophils Relative %: 68 %
Platelet Count: 71 10*3/uL — ABNORMAL LOW (ref 150–400)
RBC: 3.08 MIL/uL — ABNORMAL LOW (ref 3.87–5.11)
RDW: 22.5 % — ABNORMAL HIGH (ref 11.5–15.5)
WBC Count: 2.1 10*3/uL — ABNORMAL LOW (ref 4.0–10.5)
nRBC: 0 % (ref 0.0–0.2)

## 2023-01-19 LAB — PREPARE RBC (CROSSMATCH)

## 2023-01-19 LAB — SAMPLE TO BLOOD BANK

## 2023-01-19 MED ORDER — ACETAMINOPHEN 325 MG PO TABS
650.0000 mg | ORAL_TABLET | Freq: Once | ORAL | Status: AC
  Administered 2023-01-19: 650 mg via ORAL
  Filled 2023-01-19: qty 2

## 2023-01-19 MED ORDER — DIPHENHYDRAMINE HCL 25 MG PO CAPS
25.0000 mg | ORAL_CAPSULE | Freq: Once | ORAL | Status: AC
  Administered 2023-01-19: 25 mg via ORAL
  Filled 2023-01-19: qty 1

## 2023-01-19 MED ORDER — SODIUM CHLORIDE 0.9 % IV SOLN
200.0000 mg | Freq: Once | INTRAVENOUS | Status: AC
  Administered 2023-01-19: 200 mg via INTRAVENOUS
  Filled 2023-01-19: qty 10

## 2023-01-19 MED ORDER — PROMETHAZINE HCL 25 MG RE SUPP
25.0000 mg | Freq: Four times a day (QID) | RECTAL | 1 refills | Status: DC | PRN
  Filled 2023-01-19: qty 12, 3d supply, fill #0
  Filled 2023-01-22: qty 12, 3d supply, fill #1

## 2023-01-19 MED ORDER — SODIUM CHLORIDE 0.9% FLUSH: 10.0000 mL | INTRAVENOUS | Status: DC | PRN

## 2023-01-19 MED ORDER — DIPHENHYDRAMINE HCL 25 MG PO CAPS: 25.0000 mg | ORAL_CAPSULE | Freq: Once | ORAL | Status: DC

## 2023-01-19 MED ORDER — SODIUM CHLORIDE 0.9% IV SOLUTION
250.0000 mL | Freq: Once | INTRAVENOUS | Status: AC
  Administered 2023-01-19: 250 mL via INTRAVENOUS

## 2023-01-19 MED ORDER — ACETAMINOPHEN 325 MG PO TABS: 650.0000 mg | ORAL_TABLET | Freq: Once | ORAL | Status: DC

## 2023-01-19 NOTE — Assessment & Plan Note (Addendum)
Due to cirrhosis of the liver and splenomegaly   Lab review: 02/04/2018: Platelets 135 07/27/2018: Platelet count 102 01/28/2020: Platelet count 53 02/07/2020: Platelet count 63 05/13/2022: Platelets 87 01/06/2023: Hemoglobin 7, MCV 77.4, platelets 71, WBC 2.6, iron saturation 2.8%, TIBC 459, ferritin 2.9 01/19/2023: Hemoglobin 7.4, MCV 86, WBC 2.1, platelets 71 (patient received 4 doses of IV iron so far)   Previous work-up was negative hepatitis B and C.  Hepatitis A showed seropositivity   Treatment plan: 2 units of PRBC We also discussed the role of bone marrow biopsy for further evaluation of the pancytopenia. Prior gastric bypass surgery We can hold off on the last IV iron therapy and recheck her labs and follow-up and decide on the bone marrow biopsy.  Return to clinic in 1 month to recheck labs

## 2023-01-19 NOTE — Telephone Encounter (Signed)
Yes, can provide Phenergan 25 mg suppository every 8 hours as needed to use for refractory nausea and vomiting

## 2023-01-19 NOTE — Patient Instructions (Signed)
Blood Transfusion, Adult A blood transfusion is a procedure in which you receive blood through an IV tube. You may need this procedure because of: A bleeding disorder. An illness. An injury. A surgery. The blood may come from someone else (a donor). You may also be able to donate blood for yourself before a surgery. The blood given in a transfusion may be made up of different types of cells. You may get: Red blood cells. These carry oxygen to the cells in the body. Platelets. These help your blood to clot. Plasma. This is the liquid part of your blood. It carries proteins and other substances through the body. White blood cells. These help you fight infections. If you have a clotting disorder, you may also get other types of blood products. Depending on the type of blood product, this procedure may take 1-4 hours to complete. Tell your doctor about: Any bleeding problems you have. Any reactions you have had during a blood transfusion in the past. Any allergies you have. All medicines you are taking, including vitamins, herbs, eye drops, creams, and over-the-counter medicines. Any surgeries you have had. Any medical conditions you have. Whether you are pregnant or may be pregnant. What are the risks? Talk with your health care provider about risks. The most common problems include: A mild allergic reaction. This includes red, swollen areas of skin (hives) and itching. Fever or chills. This may be the body's response to new blood cells received. This may happen during or up to 4 hours after the transfusion. More serious problems may include: A serious allergic reaction. This includes breathing trouble or swelling around the face and lips. Too much fluid in the lungs. This may cause breathing problems. Lung injury. This causes breathing trouble and low oxygen in the blood. This can happen within hours of the transfusion or days later. Too much iron. This can happen after getting many blood  transfusions over a period of time. An infection or virus passed through the blood. This is rare. Donated blood is carefully tested before it is given. Your body's defense system (immune system) trying to attack the new blood cells. This is rare. Symptoms may include fever, chills, nausea, low blood pressure, and low back or chest pain. Donated cells attacking healthy tissues. This is rare. What happens before the procedure? You will have a blood test to find out your blood type. The test also finds out what type of blood your body will accept and matches it to the donor type. If you are going to have a planned surgery, you may be able to donate your own blood. This may be done in case you need a transfusion. You will have your temperature, blood pressure, and pulse checked. You may receive medicine to help prevent an allergic reaction. This may be done if you have had a reaction to a transfusion before. This medicine may be given to you by mouth or through an IV tube. What happens during the procedure?  An IV tube will be put into one of your veins. The bag of blood will be attached to your IV tube. Then, the blood will enter through your vein. Your temperature, blood pressure, and pulse will be checked often. This is done to find early signs of a transfusion reaction. Tell your nurse right away if you have any of these symptoms: Shortness of breath or trouble breathing. Chest or back pain. Fever or chills. Red, swollen areas of skin or itching. If you have any signs   or symptoms of a reaction, your transfusion will be stopped. You may also be given medicine. When the transfusion is finished, your IV tube will be taken out. Pressure may be put on the IV site for a few minutes. A bandage (dressing) will be put on the IV site. The procedure may vary among doctors and hospitals. What happens after the procedure? You will be monitored until you leave the hospital or clinic. This includes  checking your temperature, blood pressure, pulse, breathing rate, and blood oxygen level. Your blood may be tested to see how you have responded to the transfusion. You may be warmed with fluids or blankets. This is done to keep the temperature of your body normal. If you have your procedure in an outpatient setting, you will be told whom to contact to report any reactions. Where to find more information Visit the [employer]: redcross.org Summary A blood transfusion is a procedure in which you receive blood through an IV tube. The blood you are given may be made up of different blood cells. You may receive red blood cells, platelets, plasma, or white blood cells. Your temperature, blood pressure, and pulse will be checked often. After the procedure, your blood may be tested to see how you have responded. This information is not intended to replace advice given to you by your health care provider. Make sure you discuss any questions you have with your health care provider. Document Revised: 08/30/2021 Document Reviewed: 08/30/2021 Elsevier Patient Education  2024 Elsevier Inc.  

## 2023-01-19 NOTE — Progress Notes (Signed)
Patient Care Team: Creola Corn, MD as PCP - General (Internal Medicine) Lucie Leather, Alvira Philips, MD as Consulting Physician (Allergy and Immunology) Yates Decamp, MD as Consulting Physician (Cardiology) Reather Littler, MD as Consulting Physician (Endocrinology) Pyrtle, Carie Caddy, MD as Consulting Physician (Gastroenterology) Lucie Leather, Alvira Philips, MD as Consulting Physician (Allergy and Immunology) Serena Croissant, MD as Consulting Physician (Hematology and Oncology)  DIAGNOSIS:  Encounter Diagnoses  Name Primary?   Thrombocytopenia (HCC) Yes   Iron deficiency anemia, unspecified iron deficiency anemia type       CHIEF COMPLIANT: Follow-up of thrombocytopenia and splenomegaly and severe iron deficiency anemia    INTERVAL HISTORY: Linda Becker is a 32 y.o. with above-mentioned history of thrombocytopenia and splenomegaly secondary to liver dysfunction and severe iron deficiency anemia.  Over the past 6 months she has been feeling quite miserable with craving for ice chips.  Her hemoglobin dropped significantly and she started receiving IV iron therapy with her PCP.  She has received 4 doses and has 1/5 dose coming up as well.  She has been craving ice chips and over the past week or so the amount of ice chips that she is eating has reduced secondary to the IV iron treatments.  She has an appointment with GI coming up.  She has a prior history of esophageal varices that have bled.   ALLERGIES:  is allergic to progesterone and penicillins.  MEDICATIONS:  Current Outpatient Medications  Medication Sig Dispense Refill   acetaminophen-codeine (TYLENOL #3) 300-30 MG tablet Take 1 tablet by mouth every 6 (six) hours. 12 tablet 0   albuterol (PROAIR HFA) 108 (90 Base) MCG/ACT inhaler Inhale 2 puffs into the lungs every 4-6 hours as needed. 18 g 0   albuterol (PROVENTIL) (2.5 MG/3ML) 0.083% nebulizer solution Inhale 1 ampule by nebulization every 4 (four) hours as needed for wheezing or shortness of breath. 90 mL  1   Albuterol Sulfate, sensor, (PROAIR DIGIHALER) 108 (90 Base) MCG/ACT AEPB Inhale 2 puffs into the lungs every 4 hours as needed (For coughing and wheezing). 1 each 3   Ascorbic Acid (VITAMIN C) 1000 MG tablet Take 2,000 mg by mouth 2 (two) times daily.     budesonide (RHINOCORT AQUA) 32 MCG/ACT nasal spray Place 1 spray into both nostrils in the morning and at bedtime. 8.43 mL 2   CALCIUM PO Take 1 tablet by mouth 2 (two) times daily.     Cetirizine HCl 10 MG CAPS Take 10 mg by mouth 2 (two) times daily.      cyclobenzaprine (FLEXERIL) 10 MG tablet Take 1 tablet (10 mg total) by mouth 3 (three) times daily as needed for muscle spasms 15 tablet 0   cyclobenzaprine (FLEXERIL) 10 MG tablet Take 1 tablet (10 mg total) by mouth 3 (three) times daily as needed for muscle spasms 21 tablet 0   diphenhydrAMINE (BENADRYL) 25 MG tablet Take 50 mg by mouth at bedtime as needed for allergies or sleep.     ergocalciferol (VITAMIN D2) 1.25 MG (50000 UT) capsule Take 1 capsule (50,000 Units total) by mouth once a week. 12 capsule 1   famotidine (PEPCID) 40 MG tablet Take 1 tablet (40 mg total) by mouth 2 (two) times daily. 60 tablet 0   gabapentin (NEURONTIN) 300 MG capsule Take 1 capsule (300 mg total) by mouth 2 (two) times daily. 180 capsule 1   nadolol (CORGARD) 40 MG tablet Take 1 tablet (40 mg total) by mouth daily. 90 tablet 1   Prenatal  Vit-Fe Fumarate-FA (MULTIVITAMIN-PRENATAL) 27-0.8 MG TABS tablet Take 1 tablet by mouth 2 (two) times daily.     promethazine (PHENERGAN) 25 MG suppository Place 1 suppository (25 mg total) rectally every 6 (six) hours as needed for nausea or vomiting. 12 each 0   promethazine (PHENERGAN) 25 MG tablet Take 1 tablet (25 mg) by mouth 3 times daily as needed. 120 tablet 0   sucralfate (CARAFATE) 1 GM/10ML suspension Take 10 mLs by mouth 4 times daily -  with meals and at bedtime. 420 mL 1   ursodiol (ACTIGALL) 500 MG tablet Take 1 tablet (500 mg total) by mouth 3 (three)  times daily 90 tablet 11   Vonoprazan Fumarate (VOQUEZNA) 20 MG TABS Take 1 tablet by mouth daily. 30 tablet 1   XIFAXAN 550 MG TABS tablet Take 1 tablet (550 mg total) by mouth 2 (two) times daily. 180 tablet 3   No current facility-administered medications for this visit.   Facility-Administered Medications Ordered in Other Visits  Medication Dose Route Frequency Provider Last Rate Last Admin   0.9 %  sodium chloride infusion   Intravenous Once PRN Pyrtle, Carie Caddy, MD       acetaminophen (TYLENOL) tablet 650 mg  650 mg Oral Once Pyrtle, Carie Caddy, MD       albuterol (VENTOLIN HFA) 108 (90 Base) MCG/ACT inhaler 2 puff  2 puff Inhalation Once PRN Pyrtle, Carie Caddy, MD       diphenhydrAMINE (BENADRYL) capsule 25 mg  25 mg Oral Once Pyrtle, Carie Caddy, MD       diphenhydrAMINE (BENADRYL) injection 50 mg  50 mg Intravenous Once PRN Pyrtle, Carie Caddy, MD       EPINEPHrine (EPI-PEN) injection 0.3 mg  0.3 mg Intramuscular Once PRN Pyrtle, Carie Caddy, MD       famotidine (PEPCID) IVPB 20 mg premix  20 mg Intravenous Once PRN Pyrtle, Carie Caddy, MD       iron sucrose (VENOFER) 200 mg in sodium chloride 0.9 % 100 mL IVPB  200 mg Intravenous Once Pyrtle, Carie Caddy, MD       methylPREDNISolone sodium succinate (SOLU-MEDROL) 125 mg/2 mL injection 125 mg  125 mg Intravenous Once PRN Pyrtle, Carie Caddy, MD       promethazine (PHENERGAN) suppository 12.5-25 mg  12.5-25 mg Rectal Q4H PRN Berna Bue, MD        PHYSICAL EXAMINATION: ECOG PERFORMANCE STATUS: 2 - Symptomatic, <50% confined to bed  Vitals:   01/19/23 1206  BP: 130/76  Pulse: (!) 109  Resp: 18  Temp: (!) 97.5 F (36.4 C)  SpO2: 100%   Filed Weights   01/19/23 1206  Weight: 208 lb 9.6 oz (94.6 kg)      LABORATORY DATA:  I have reviewed the data as listed    Latest Ref Rng & Units 01/06/2023   12:36 PM 08/27/2022    9:46 AM 05/05/2022    2:40 PM  CMP  Glucose 70 - 99 mg/dL 91  90  93   BUN 6 - 23 mg/dL 9  15  13    Creatinine 0.40 - 1.20 mg/dL 0.98  1.19   1.47   Sodium 135 - 145 mEq/L 138  139  138   Potassium 3.5 - 5.1 mEq/L 4.2  4.5  4.7   Chloride 96 - 112 mEq/L 109  111  108   CO2 19 - 32 mEq/L 24  21  24    Calcium 8.4 - 10.5 mg/dL 8.1  8.6  8.4   Total Protein 6.0 - 8.3 g/dL 5.4  5.8  6.1   Total Bilirubin 0.2 - 1.2 mg/dL 0.4  0.3  0.4   Alkaline Phos 39 - 117 U/L 45  45  47   AST 0 - 37 U/L 15  15  19    ALT 0 - 35 U/L 10  10  19      Lab Results  Component Value Date   WBC 2.1 (L) 01/19/2023   HGB 7.4 (L) 01/19/2023   HCT 26.5 (L) 01/19/2023   MCV 86.0 01/19/2023   PLT 71 (L) 01/19/2023   NEUTROABS 1.4 (L) 01/19/2023    ASSESSMENT & PLAN:  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  Hepatitis A showed seropositivity   Treatment plan: 2 units of PRBC because patient is extremely symptomatic with anemia We also discussed the role of bone marrow biopsy for further evaluation of the pancytopenia. Prior gastric bypass surgery We can hold off on the last IV iron therapy and recheck her labs and follow-up and decide on the bone marrow biopsy.  Return to clinic in 1 month to recheck labs    Orders Placed This Encounter  Procedures   Informed Consent Details: Physician/Practitioner Attestation; Transcribe to consent form and obtain patient signature    Standing Status:   Standing    Number of Occurrences:   1    Order Specific Question:   Physician/Practitioner attestation of informed consent for blood and or blood product transfusion    Answer:   I, the physician/practitioner, attest that I  have discussed with the patient the benefits, risks, side effects, alternatives, likelihood of achieving goals and potential problems during recovery for the procedure that I have provided informed consent.    Order Specific Question:   Product(s)    Answer:   All Product(s)   The patient has a good understanding of the overall plan. she agrees with it. she will call with any problems that may develop before the next visit here. Total time spent: 30 mins including face to face time and time spent for planning, charting and co-ordination of care   Tamsen Meek, MD 01/19/23    sacroiliac

## 2023-01-19 NOTE — Progress Notes (Signed)
OK to run blood at 300cc/hr today per VO from Dr. Pamelia Hoit

## 2023-01-19 NOTE — Progress Notes (Signed)
Diagnosis: Iron Deficiency Anemia  Provider:  Chilton Greathouse MD  Procedure: IV Infusion  IV Type: Peripheral, IV Location: R Antecubital  Venofer (Iron Sucrose), Dose: 200 mg  Infusion Start Time: 1101  Infusion Stop Time: 1120  Post Infusion IV Care: Patient declined observation and Peripheral IV Discontinued  Discharge: Condition: Good, Destination: Home . AVS Declined  Performed by:  Loney Hering, LPN

## 2023-01-19 NOTE — Progress Notes (Signed)
Per MD, orders placed for 2 UNITS PRBC with Tylenol 650 and benadryl 25. Orders confirmed with [place] in [place].

## 2023-01-20 ENCOUNTER — Other Ambulatory Visit (HOSPITAL_COMMUNITY): Payer: Self-pay

## 2023-01-20 ENCOUNTER — Ambulatory Visit: Payer: Medicaid Other

## 2023-01-20 ENCOUNTER — Other Ambulatory Visit: Payer: Self-pay

## 2023-01-20 DIAGNOSIS — I951 Orthostatic hypotension: Secondary | ICD-10-CM

## 2023-01-20 DIAGNOSIS — R002 Palpitations: Secondary | ICD-10-CM

## 2023-01-21 ENCOUNTER — Encounter: Payer: Self-pay | Admitting: Internal Medicine

## 2023-01-21 ENCOUNTER — Other Ambulatory Visit (HOSPITAL_COMMUNITY): Payer: Self-pay

## 2023-01-21 ENCOUNTER — Other Ambulatory Visit (INDEPENDENT_AMBULATORY_CARE_PROVIDER_SITE_OTHER): Payer: Medicaid Other

## 2023-01-21 ENCOUNTER — Ambulatory Visit (INDEPENDENT_AMBULATORY_CARE_PROVIDER_SITE_OTHER): Payer: Medicaid Other

## 2023-01-21 ENCOUNTER — Ambulatory Visit (INDEPENDENT_AMBULATORY_CARE_PROVIDER_SITE_OTHER): Payer: Medicaid Other | Admitting: Internal Medicine

## 2023-01-21 VITALS — BP 117/77 | HR 89 | Temp 98.4°F | Resp 18 | Ht 65.0 in | Wt 208.4 lb

## 2023-01-21 VITALS — BP 126/76 | HR 110 | Ht 65.0 in | Wt 209.2 lb

## 2023-01-21 DIAGNOSIS — K766 Portal hypertension: Secondary | ICD-10-CM | POA: Diagnosis not present

## 2023-01-21 DIAGNOSIS — K2100 Gastro-esophageal reflux disease with esophagitis, without bleeding: Secondary | ICD-10-CM

## 2023-01-21 DIAGNOSIS — D509 Iron deficiency anemia, unspecified: Secondary | ICD-10-CM | POA: Diagnosis not present

## 2023-01-21 DIAGNOSIS — R5382 Chronic fatigue, unspecified: Secondary | ICD-10-CM | POA: Diagnosis not present

## 2023-01-21 DIAGNOSIS — R110 Nausea: Secondary | ICD-10-CM

## 2023-01-21 DIAGNOSIS — R109 Unspecified abdominal pain: Secondary | ICD-10-CM

## 2023-01-21 DIAGNOSIS — R161 Splenomegaly, not elsewhere classified: Secondary | ICD-10-CM

## 2023-01-21 DIAGNOSIS — Z8719 Personal history of other diseases of the digestive system: Secondary | ICD-10-CM

## 2023-01-21 DIAGNOSIS — R000 Tachycardia, unspecified: Secondary | ICD-10-CM

## 2023-01-21 DIAGNOSIS — K7460 Unspecified cirrhosis of liver: Secondary | ICD-10-CM

## 2023-01-21 DIAGNOSIS — Z86010 Personal history of colonic polyps: Secondary | ICD-10-CM

## 2023-01-21 DIAGNOSIS — K7581 Nonalcoholic steatohepatitis (NASH): Secondary | ICD-10-CM

## 2023-01-21 LAB — CBC WITH DIFFERENTIAL/PLATELET
Basophils Absolute: 0 10*3/uL (ref 0.0–0.1)
Basophils Relative: 0.3 % (ref 0.0–3.0)
Eosinophils Absolute: 0.1 10*3/uL (ref 0.0–0.7)
Eosinophils Relative: 3.1 % (ref 0.0–5.0)
HCT: 33.9 % — ABNORMAL LOW (ref 36.0–46.0)
Hemoglobin: 10 g/dL — ABNORMAL LOW (ref 12.0–15.0)
Lymphocytes Relative: 18 % (ref 12.0–46.0)
Lymphs Abs: 0.5 10*3/uL — ABNORMAL LOW (ref 0.7–4.0)
MCHC: 29.6 g/dL — ABNORMAL LOW (ref 30.0–36.0)
MCV: 85.2 fl (ref 78.0–100.0)
Monocytes Absolute: 0.1 10*3/uL (ref 0.1–1.0)
Monocytes Relative: 5.1 % (ref 3.0–12.0)
Neutro Abs: 1.9 10*3/uL (ref 1.4–7.7)
Neutrophils Relative %: 73.5 % (ref 43.0–77.0)
Platelets: 72 10*3/uL — ABNORMAL LOW (ref 150.0–400.0)
RBC: 3.98 Mil/uL (ref 3.87–5.11)
RDW: 24.5 % — ABNORMAL HIGH (ref 11.5–15.5)
WBC: 2.6 10*3/uL — ABNORMAL LOW (ref 4.0–10.5)

## 2023-01-21 LAB — PROTIME-INR
INR: 1.3 ratio — ABNORMAL HIGH (ref 0.8–1.0)
Prothrombin Time: 13.4 s — ABNORMAL HIGH (ref 9.6–13.1)

## 2023-01-21 LAB — COMPREHENSIVE METABOLIC PANEL
ALT: 12 U/L (ref 0–35)
AST: 13 U/L (ref 0–37)
Albumin: 4.1 g/dL (ref 3.5–5.2)
Alkaline Phosphatase: 56 U/L (ref 39–117)
BUN: 10 mg/dL (ref 6–23)
CO2: 20 mEq/L (ref 19–32)
Calcium: 8.9 mg/dL (ref 8.4–10.5)
Chloride: 114 mEq/L — ABNORMAL HIGH (ref 96–112)
Creatinine, Ser: 0.97 mg/dL (ref 0.40–1.20)
GFR: 78.58 mL/min (ref >60.00)
Glucose, Bld: 96 mg/dL (ref 70–99)
Potassium: 4.5 mEq/L (ref 3.5–5.1)
Sodium: 142 mEq/L (ref 135–145)
Total Bilirubin: 0.5 mg/dL (ref 0.2–1.2)
Total Protein: 6.4 g/dL (ref 6.0–8.3)

## 2023-01-21 MED ORDER — NA SULFATE-K SULFATE-MG SULF 17.5-3.13-1.6 GM/177ML PO SOLN
1.0000 | Freq: Once | ORAL | 0 refills | Status: AC
  Filled 2023-01-21: qty 354, 1d supply, fill #0

## 2023-01-21 MED ORDER — DIPHENHYDRAMINE HCL 25 MG PO CAPS: 25.0000 mg | ORAL_CAPSULE | Freq: Once | ORAL | Status: DC

## 2023-01-21 MED ORDER — ACETAMINOPHEN 325 MG PO TABS: 650.0000 mg | ORAL_TABLET | Freq: Once | ORAL | Status: DC

## 2023-01-21 MED ORDER — SODIUM CHLORIDE 0.9 % IV SOLN
200.0000 mg | Freq: Once | INTRAVENOUS | Status: AC
  Administered 2023-01-21: 200 mg via INTRAVENOUS
  Filled 2023-01-21: qty 10

## 2023-01-21 NOTE — Patient Instructions (Signed)
Your provider has requested that you go to the basement level for lab work before leaving today. Press "B" on the elevator. The lab is located at the first door on the left as you exit the elevator.  You have been scheduled for an endoscopy and colonoscopy. Please follow the written instructions given to you at your visit today.  Please pick up your prep supplies at the pharmacy within the next 1-3 days.  If you use inhalers (even only as needed), please bring them with you on the day of your procedure.  DO NOT TAKE 7 DAYS PRIOR TO TEST- Trulicity (dulaglutide) Ozempic, Wegovy (semaglutide) Mounjaro (tirzepatide) Bydureon Bcise (exanatide extended release)  DO NOT TAKE 1 DAY PRIOR TO YOUR TEST Rybelsus (semaglutide) Adlyxin (lixisenatide) Victoza (liraglutide) Byetta (exanatide) ___________________________________________________________________________ _______________________________________________________  If your blood pressure at your visit was 140/90 or greater, please contact your primary care physician to follow up on this.  _______________________________________________________  If you are age 58 or older, your body mass index should be between 23-30. Your Body mass index is 34.82 kg/m. If this is out of the aforementioned range listed, please consider follow up with your Primary Care Provider.  If you are age 29 or younger, your body mass index should be between 19-25. Your Body mass index is 34.82 kg/m. If this is out of the aformentioned range listed, please consider follow up with your Primary Care Provider.   ________________________________________________________  The [facility] providers would like to encourage you to use [facility] to communicate with providers for non-urgent requests or questions.  Due to long hold times on the telephone, sending your provider a message by [facility] may be a faster and more efficient way to get a response.  Please allow 48 business  hours for a response.  Please remember that this is for non-urgent requests.  _______________________________________________________

## 2023-01-21 NOTE — Progress Notes (Signed)
Diagnosis: Iron Deficiency Anemia  Provider:  Chilton Greathouse MD  Procedure: IV Infusion  IV Type: Peripheral, IV Location: R Antecubital  Venofer (Iron Sucrose), Dose: 200 mg  Infusion Start Time: 1039  Infusion Stop Time: 1057  Post Infusion IV Care: Patient declined observation. PIV removed  Discharge: Condition: Good, Destination: Home . AVS Declined  Performed by:  Rico Ala, LPN

## 2023-01-21 NOTE — Progress Notes (Signed)
Subjective:    Patient ID: Linda Becker, female    DOB: 09-08-1990, 32 y.o.   MRN: [id number]  HPI Linda Becker is a 32 year old female with a history of MASH cirrhosis with portal hypertension, prior esophageal varices with bleeding, thrombocytopenia, history of obesity status post gastric sleeve, asthma, chronic abdominal pain, IDA, GERD and possible EoE who is here for follow-up.   She reports that she has had extreme issues with fatigue and low energy levels.  She was craving ice but this is stopped since she received IV iron.  She received 2 units of packed red cells yesterday.  She has not seen any overt bleeding.  She does have hemorrhoids which have scant bleeding at times but no blood in stool or melena.  No hematemesis.  The vonoprazan has continued at 20 mg with famotidine 40 mg twice daily.  She has pressure in her esophagus but no significant heartburn.  At times it feels like solid food sticks when trying to swallow.  She does feel like the vonoprazan works better than PPI for her previously.  She did not notice much help with the [facility] and this has been off for several months.  She still using Tums.  Nausea continues to be a daily problem.  She tries to avoid vomiting over fear of hematemesis.  She is using predominantly promethazine by mouth with suppository as a backup option.  Zofran was not helpful and it caused headaches and worsening tachycardia.  Meclizine and Dramamine not helpful.  She struggling with insomnia.  Her tachycardia has been worse and she is seeing cardiology.  She is currently wearing cardiac patch monitor.  She continues nadolol 40 mg and will likely soon resume verapamil.  Her mood is depressed without HI or SI.  She states "I just feel like crap all the time".  She has continued her ursodiol but she is only using this 1-2 times a day.  She has continued rifaximin twice daily  Review of Systems As per HPI, otherwise negative  Current  Medications, Allergies, Past Medical History, Past Surgical History, Family History and Social History were reviewed in [employer] record.    Objective:   Physical Exam BP 126/76   Pulse (!) 110   Ht 5\' 5"  (1.651 m)   Wt 209 lb 4 oz (94.9 kg)   BMI 34.82 kg/m  Gen: awake, alert, NAD HEENT: anicteric  CV: tachy, reg Pulm: CTA b/l Abd: soft, NT/ND, +BS throughout Ext: no c/c/e Neuro: nonfocal  Iron/TIBC/Ferritin/ %Sat    Component Value Date/Time   IRON 13 (L) 01/06/2023 1236   TIBC 459.2 (H) 01/06/2023 1236   FERRITIN 2.9 (L) 01/06/2023 1236   IRONPCTSAT 2.8 (L) 01/06/2023 1236   IRONPCTSAT 6 (L) 05/13/2022 1224      Latest Ref Rng & Units 01/21/2023    9:49 AM 01/19/2023   11:41 AM 01/13/2023    9:31 AM  CBC  WBC 4.0 - 10.5 K/uL 2.6  2.1  2.7   Hemoglobin 12.0 - 15.0 g/dL 86.5  7.4  7.1 cL   Hematocrit 36.0 - 46.0 % 33.9  26.5  24.8 aL   Platelets 150.0 - 400.0 K/uL 72.0  71  81.0    Lab Results  Component Value Date   INR 1.3 (H) 01/21/2023   INR 1.3 (H) 01/06/2023   INR 1.0 08/27/2022   CMP     Component Value Date/Time   NA 142 01/21/2023 0949   NA 145  01/14/2020 1309   K 4.5 01/21/2023 0949   CL 114 (H) 01/21/2023 0949   CO2 20 01/21/2023 0949   GLUCOSE 96 01/21/2023 0949   BUN 10 01/21/2023 0949   BUN 12 01/14/2020 1309   CREATININE 0.97 01/21/2023 0949   CREATININE 0.84 02/07/2020 1412   CALCIUM 8.9 01/21/2023 0949   PROT 6.4 01/21/2023 0949   PROT 6.2 (L) 01/14/2020 1309   ALBUMIN 4.1 01/21/2023 0949   ALBUMIN 3.9 01/14/2020 1309   AST 13 01/21/2023 0949   AST 31 02/07/2020 1412   ALT 12 01/21/2023 0949   ALT 47 (H) 02/07/2020 1412   ALKPHOS 56 01/21/2023 0949   BILITOT 0.5 01/21/2023 0949   BILITOT 0.3 02/07/2020 1412   GFR 78.58 01/21/2023 0949   GFRNONAA >60 05/06/2021 1016   GFRNONAA >60 02/07/2020 1412   MELD 3.0: 10 at 01/21/2023  9:49 AM MELD-Na: 9 at 01/21/2023  9:49 AM Calculated from: Serum Creatinine: 0.97  mg/dL (Using min of 1 mg/dL) at 12/22/2954  2:13 AM Serum Sodium: 142 mEq/L (Using max of 137 mEq/L) at 01/21/2023  9:49 AM Total Bilirubin: 0.5 mg/dL (Using min of 1 mg/dL) at 0/01/6577  4:69 AM Serum Albumin: 4.1 g/dL (Using max of 3.5 g/dL) at 11/16/9526  4:13 AM INR(ratio): 1.3 ratio at 01/21/2023  9:49 AM Age at listing (hypothetical): 32 years Sex: Female at 01/21/2023  9:49 AM       Assessment & Plan:  32 year old female with a history of MASH cirrhosis with portal hypertension, prior esophageal varices with bleeding, thrombocytopenia, history of obesity status post gastric sleeve, asthma, chronic abdominal pain, IDA, GERD and possible EoE who is here for follow-up.   NASH cirrhosis with portal hypertension/history of varices/splenomegaly --her liver disease has remained compensated though her MELD score is a bit higher today at 10 based on her INR which has crept up from 1.0-1.3.  She has followed with [facility] hepatology.  She needs to be more adherent to ursodiol 3 times a day.  She is up-to-date with variceal screening and HCC screening. -- Continue to follow with [facility] hepatology -- Continue ursodiol 500 mg 3 times daily -- Variceal screening up-to-date but we are repeating EGD, see below -- [facility] screening up-to-date she had an MRI in October 2023 and a CT in December 2023.  No concerning liver lesions for [facility] -- Liver labs repeated today, MELD = 10  2.  Chronic fatigue/IDA --recurrent IDA.  No overt blood loss.  Repeat upper endoscopy and colonoscopy recommended.  May need capsule endoscopy.  Seeing hematology which she should continue.  Bone marrow biopsy being considered. -- She received 2 units of blood this week as well as completed #5 of Venofer today -- Continue to monitor closely with hematology -- Upper and lower endoscopy in the [facility]; we reviewed the risks, benefits and alternatives and she is agreeable and wishes to proceed  3.  GERD with esophagitis and possible EoE --eosinophilic  inflammation suspicious for reflux versus EOE.  She did not seem to benefit clinically from [place] which is a bit surprising.  Vonoprazan seems to be working better than pantoprazole.  She is also been on famotidine twice daily at high dose.  Certainly her acid to be suppressed.  With dysphagia we are repeating EGD -- EGD in the [facility] -- Continue vonoprazan 20 mg daily and famotidine 40 mg twice daily -- Plan esophageal biopsies and may need Dupixent  4.  History of SSP of the colon --surveillance colonoscopy indicated  in March 2025, we are proceeding sooner as per #2  5.  Chronic nausea and abdominal pain --promethazine has been the only helpful agent.  Unclear exact etiology here. -- Promethazine p.o. 25 mg every 6-8 hours as needed, same dose PR if unable to PO  6.  Tachycardia --on nadolol.  Following with cardiology with Holter monitor pending

## 2023-01-22 ENCOUNTER — Other Ambulatory Visit (HOSPITAL_COMMUNITY): Payer: Self-pay

## 2023-01-22 ENCOUNTER — Other Ambulatory Visit: Payer: Self-pay

## 2023-01-23 ENCOUNTER — Other Ambulatory Visit: Payer: Self-pay | Admitting: Internal Medicine

## 2023-01-23 ENCOUNTER — Other Ambulatory Visit: Payer: Self-pay

## 2023-01-23 ENCOUNTER — Other Ambulatory Visit (HOSPITAL_COMMUNITY): Payer: Self-pay

## 2023-01-23 MED ORDER — PROMETHAZINE HCL 25 MG PO TABS
25.0000 mg | ORAL_TABLET | Freq: Three times a day (TID) | ORAL | 1 refills | Status: DC | PRN
  Filled 2023-01-23 – 2023-01-24 (×5): qty 90, 30d supply, fill #0
  Filled 2023-02-02: qty 90, 30d supply, fill #1

## 2023-01-24 ENCOUNTER — Encounter: Payer: Self-pay | Admitting: Hematology and Oncology

## 2023-01-24 ENCOUNTER — Other Ambulatory Visit (HOSPITAL_COMMUNITY): Payer: Self-pay

## 2023-01-26 ENCOUNTER — Other Ambulatory Visit (HOSPITAL_COMMUNITY): Payer: Self-pay

## 2023-01-26 ENCOUNTER — Other Ambulatory Visit: Payer: Self-pay

## 2023-01-26 MED ORDER — PROMETHAZINE HCL 25 MG RE SUPP
25.0000 mg | Freq: Four times a day (QID) | RECTAL | 1 refills | Status: DC | PRN
  Filled 2023-01-26: qty 12, 3d supply, fill #0
  Filled 2023-02-02 – 2023-02-06 (×2): qty 12, 3d supply, fill #1

## 2023-01-31 ENCOUNTER — Other Ambulatory Visit (HOSPITAL_COMMUNITY): Payer: Self-pay

## 2023-02-02 ENCOUNTER — Other Ambulatory Visit (HOSPITAL_COMMUNITY): Payer: Self-pay

## 2023-02-03 ENCOUNTER — Other Ambulatory Visit (HOSPITAL_COMMUNITY): Payer: Self-pay

## 2023-02-06 ENCOUNTER — Other Ambulatory Visit: Payer: Self-pay

## 2023-02-06 ENCOUNTER — Other Ambulatory Visit: Payer: Self-pay | Admitting: Internal Medicine

## 2023-02-06 ENCOUNTER — Other Ambulatory Visit (HOSPITAL_COMMUNITY): Payer: Self-pay

## 2023-02-09 ENCOUNTER — Other Ambulatory Visit (HOSPITAL_COMMUNITY): Payer: Self-pay

## 2023-02-09 ENCOUNTER — Other Ambulatory Visit: Payer: Self-pay

## 2023-02-09 ENCOUNTER — Encounter: Payer: Self-pay | Admitting: Internal Medicine

## 2023-02-09 MED ORDER — PROMETHAZINE HCL 25 MG RE SUPP
25.0000 mg | Freq: Four times a day (QID) | RECTAL | 1 refills | Status: DC | PRN
  Filled 2023-02-09: qty 20, 5d supply, fill #0
  Filled 2023-02-09: qty 10, 3d supply, fill #0
  Filled 2023-02-20 – 2023-04-03 (×5): qty 30, 8d supply, fill #1

## 2023-02-09 MED ORDER — PROMETHAZINE HCL 25 MG PO TABS
25.0000 mg | ORAL_TABLET | Freq: Four times a day (QID) | ORAL | 1 refills | Status: DC | PRN
  Filled 2023-02-09: qty 120, 30d supply, fill #0

## 2023-02-09 MED ORDER — PROMETHAZINE HCL 25 MG PO TABS
25.0000 mg | ORAL_TABLET | Freq: Four times a day (QID) | ORAL | 1 refills | Status: DC | PRN
Start: 2023-02-09 — End: 2023-03-09
  Filled 2023-02-09 – 2023-02-12 (×9): qty 120, 30d supply, fill #0
  Filled 2023-02-20 – 2023-02-21 (×2): qty 120, 30d supply, fill #1

## 2023-02-09 MED ORDER — PROMETHAZINE HCL 25 MG PO TABS
25.0000 mg | ORAL_TABLET | Freq: Three times a day (TID) | ORAL | 1 refills | Status: DC | PRN
  Filled 2023-02-09: qty 90, 30d supply, fill #0

## 2023-02-09 NOTE — Addendum Note (Signed)
Addended by: Richardson Chiquito on: 02/09/2023 09:33 AM   Modules accepted: Orders

## 2023-02-10 ENCOUNTER — Other Ambulatory Visit (HOSPITAL_COMMUNITY): Payer: Self-pay

## 2023-02-10 ENCOUNTER — Other Ambulatory Visit (HOSPITAL_BASED_OUTPATIENT_CLINIC_OR_DEPARTMENT_OTHER): Payer: Self-pay

## 2023-02-11 ENCOUNTER — Other Ambulatory Visit (HOSPITAL_COMMUNITY): Payer: Self-pay

## 2023-02-11 ENCOUNTER — Other Ambulatory Visit: Payer: Self-pay | Admitting: Allergy and Immunology

## 2023-02-12 ENCOUNTER — Other Ambulatory Visit (HOSPITAL_COMMUNITY): Payer: Self-pay

## 2023-02-13 ENCOUNTER — Other Ambulatory Visit (HOSPITAL_COMMUNITY): Payer: Self-pay

## 2023-02-13 ENCOUNTER — Encounter: Payer: Self-pay | Admitting: Hematology and Oncology

## 2023-02-20 ENCOUNTER — Other Ambulatory Visit: Payer: Self-pay | Admitting: Allergy and Immunology

## 2023-02-20 ENCOUNTER — Other Ambulatory Visit (HOSPITAL_COMMUNITY): Payer: Self-pay

## 2023-02-20 ENCOUNTER — Other Ambulatory Visit: Payer: Self-pay

## 2023-02-21 ENCOUNTER — Telehealth: Payer: Medicaid Other | Admitting: Family

## 2023-02-21 ENCOUNTER — Other Ambulatory Visit (HOSPITAL_COMMUNITY): Payer: Self-pay

## 2023-02-21 DIAGNOSIS — B3731 Acute candidiasis of vulva and vagina: Secondary | ICD-10-CM

## 2023-02-21 DIAGNOSIS — R399 Unspecified symptoms and signs involving the genitourinary system: Secondary | ICD-10-CM | POA: Diagnosis not present

## 2023-02-21 MED ORDER — FLUCONAZOLE 150 MG PO TABS: 150.0000 mg | ORAL_TABLET | ORAL | 0 refills | Status: DC | PRN

## 2023-02-21 MED ORDER — NITROFURANTOIN MONOHYD MACRO 100 MG PO CAPS: 100.0000 mg | ORAL_CAPSULE | Freq: Two times a day (BID) | ORAL | 0 refills | Status: DC

## 2023-02-21 NOTE — Progress Notes (Signed)
E-Visit for Urinary Problems  We are sorry that you are not feeling well.  Here is how we plan to help!  Based on what you shared with me it looks like you most likely have a simple urinary tract infection.  A UTI (Urinary Tract Infection) is a bacterial infection of the bladder.  Most cases of urinary tract infections are simple to treat but a key part of your care is to encourage you to drink plenty of fluids and watch your symptoms carefully.  I have prescribed MacroBid 100 mg twice a day for 5 days.  I have also sent in a prescription of diflucan 150 mg tablet.  Your symptoms should gradually improve. Call us if the burning in your urine worsens, you develop worsening fever, back pain or pelvic pain or if your symptoms do not resolve after completing the antibiotic.  Urinary tract infections can be prevented by drinking plenty of water to keep your body hydrated.  Also be sure when you wipe, wipe from front to back and don't hold it in!  If possible, empty your bladder every 4 hours.  HOME CARE Drink plenty of fluids Compete the full course of the antibiotics even if the symptoms resolve Remember, when you need to go.go. Holding in your urine can increase the likelihood of getting a UTI! GET HELP RIGHT AWAY IF: You cannot urinate You get a high fever Worsening back pain occurs You see blood in your urine You feel sick to your stomach or throw up You feel like you are going to pass out  MAKE SURE YOU  Understand these instructions. Will watch your condition. Will get help right away if you are not doing well or get worse.   Thank you for choosing an e-visit.  Your e-visit answers were reviewed by a board certified advanced clinical practitioner to complete your personal care plan. Depending upon the condition, your plan could have included both over the counter or prescription medications.  Please review your pharmacy choice. Make sure the pharmacy is open so you can pick up  prescription now. If there is a problem, you may contact your provider through [employer] and have the prescription routed to another pharmacy.  Your safety is important to [place]. If you have drug allergies check your prescription carefully.   For the next 24 hours you can use MyChart to ask questions about today's visit, request a non-urgent call back, or ask for a work or school excuse. You will get an email in the next two days asking about your experience. I hope that your e-visit has been valuable and will speed your recovery.  Approximately 5 minutes was spent documenting and reviewing patient's chart.

## 2023-02-21 NOTE — Addendum Note (Signed)
Addended by: Jannifer Rodney A on: 02/21/2023 03:19 PM   Modules accepted: Level of Service

## 2023-02-23 ENCOUNTER — Other Ambulatory Visit (HOSPITAL_COMMUNITY): Payer: Self-pay

## 2023-02-23 ENCOUNTER — Other Ambulatory Visit: Payer: Self-pay

## 2023-02-23 ENCOUNTER — Inpatient Hospital Stay (HOSPITAL_COMMUNITY)
Admission: AD | Admit: 2023-02-23 | Discharge: 2023-02-23 | Disposition: A | Discharge status: Home / Self Care | Payer: Medicaid Other | Attending: Obstetrics & Gynecology | Admitting: Obstetrics & Gynecology

## 2023-02-23 DIAGNOSIS — Z3202 Encounter for pregnancy test, result negative: Secondary | ICD-10-CM | POA: Insufficient documentation

## 2023-02-23 DIAGNOSIS — R465 Suspiciousness and marked evasiveness: Secondary | ICD-10-CM | POA: Diagnosis not present

## 2023-02-23 DIAGNOSIS — Z789 Other specified health status: Secondary | ICD-10-CM | POA: Diagnosis not present

## 2023-02-23 DIAGNOSIS — O94 Sequelae of complication of pregnancy, childbirth, and the puerperium: Secondary | ICD-10-CM | POA: Diagnosis present

## 2023-02-23 NOTE — Progress Notes (Signed)
Pt walked about before receiving AVS.

## 2023-02-23 NOTE — MAU Provider Note (Addendum)
Event Date/Time   First Provider Initiated Contact with Patient 02/23/23 0630      S Ms. SANDRA KOSMALSKI is a 32 y.o. [phone] patient who presents to MAU today without complaints. Patient reports that she has a miscarriage in May of this year but due to life stressors, did not inform her husband. She reports that she continued the lie and told her husband that today is the date of her induction. She has no complaints. She denies being currently pregnant. She also denies SI/HI and partner violence. Her husband is out in the lobby with bags waiting for her to be admitted.   O BP 139/80 (BP Location: Right Arm)   Pulse (!) 108   Temp 98.2 F (36.8 C) (Oral)   Resp 19   Ht 5\' 5"  (1.651 m)   Wt 93.9 kg   SpO2 99%   BMI 34.46 kg/m  Physical Exam Vitals and nursing note reviewed.  Constitutional:      General: She is not in acute distress.    Appearance: Normal appearance.  HENT:     Head: Normocephalic.  Pulmonary:     Effort: Pulmonary effort is normal.  Musculoskeletal:     Cervical back: Normal range of motion.  Skin:    General: Skin is warm and dry.  Neurological:     Mental Status: She is alert and oriented to person, place, and time.  Psychiatric:        Mood and Affect: Mood normal.     A Medical screening exam complete - Not pregnant.   P Discharge from MAU in stable condition Offered patient option for psych therapy and patient declined.  Warning signs for worsening condition that would warrant emergency follow-up discussed Patient may return to MAU as needed    During conversation with patient, patient states "I wish you could make a baby appear."   Suspicious activity for Abduction risk, [facility] alerted of patient and patient's husband.   Samie Barclift Danella Deis) Suzie Portela, MSN, CNM  Center for [facility]  02/23/2023 6:51 AM

## 2023-02-23 NOTE — MAU Note (Signed)
.  Linda Becker is a 32 y.o. at Unknown here in MAU reporting: hx of SAB and fetal demise. Reports got pregnant in December but states she miscarried in May. States she is not currently pregnant. States husband thinks she is coming in for an induction today - she never informed him of the SAB. Denies suicidal ideations and/or abuse.   Pain score: 0 Vitals:   02/23/23 0619  BP: 139/80  Pulse: (!) 108  Resp: 19  Temp: 98.2 F (36.8 C)  SpO2: 99%       0612 Dorathy Daft, CNM at bedside in triage

## 2023-02-24 ENCOUNTER — Ambulatory Visit (HOSPITAL_COMMUNITY)
Admission: EM | Admit: 2023-02-24 | Discharge: 2023-02-25 | Disposition: A | Discharge status: Home / Self Care | Payer: Medicaid Other | Attending: Psychiatry | Admitting: Psychiatry

## 2023-02-24 ENCOUNTER — Other Ambulatory Visit: Payer: Self-pay

## 2023-02-24 DIAGNOSIS — F4310 Post-traumatic stress disorder, unspecified: Secondary | ICD-10-CM | POA: Insufficient documentation

## 2023-02-24 DIAGNOSIS — K7460 Unspecified cirrhosis of liver: Secondary | ICD-10-CM | POA: Insufficient documentation

## 2023-02-24 DIAGNOSIS — F419 Anxiety disorder, unspecified: Secondary | ICD-10-CM | POA: Insufficient documentation

## 2023-02-24 DIAGNOSIS — Z91148 Patient's other noncompliance with medication regimen for other reason: Secondary | ICD-10-CM | POA: Insufficient documentation

## 2023-02-24 DIAGNOSIS — F3181 Bipolar II disorder: Secondary | ICD-10-CM | POA: Insufficient documentation

## 2023-02-24 LAB — URINALYSIS, COMPLETE (UACMP) WITH MICROSCOPIC
Bilirubin Urine: NEGATIVE
Glucose, UA: NEGATIVE mg/dL
Hgb urine dipstick: NEGATIVE
Ketones, ur: NEGATIVE mg/dL
Leukocytes,Ua: NEGATIVE
Nitrite: NEGATIVE
Protein, ur: NEGATIVE mg/dL
Specific Gravity, Urine: 1.017 (ref 1.005–1.030)
pH: 5 (ref 5.0–8.0)

## 2023-02-24 LAB — COMPREHENSIVE METABOLIC PANEL
ALT: 14 U/L (ref 0–44)
AST: 16 U/L (ref 15–41)
Albumin: 4.1 g/dL (ref 3.5–5.0)
Alkaline Phosphatase: 47 U/L (ref 38–126)
Anion gap: 18 — ABNORMAL HIGH (ref 5–15)
BUN: 10 mg/dL (ref 6–20)
CO2: 21 mmol/L — ABNORMAL LOW (ref 22–32)
Calcium: 9.5 mg/dL (ref 8.9–10.3)
Chloride: 104 mmol/L (ref 98–111)
Creatinine, Ser: 0.86 mg/dL (ref 0.44–1.00)
GFR, Estimated: >60 mL/min (ref >60)
Glucose, Bld: 108 mg/dL — ABNORMAL HIGH (ref 70–99)
Potassium: 3.7 mmol/L (ref 3.5–5.1)
Sodium: 143 mmol/L (ref 135–145)
Total Bilirubin: 0.8 mg/dL (ref 0.3–1.2)
Total Protein: 6.5 g/dL (ref 6.5–8.1)

## 2023-02-24 LAB — POCT URINE DRUG SCREEN - MANUAL ENTRY (I-SCREEN)
POC Amphetamine UR: NOT DETECTED
POC Buprenorphine (BUP): NOT DETECTED
POC Cocaine UR: NOT DETECTED
POC Marijuana UR: NOT DETECTED
POC Methadone UR: NOT DETECTED
POC Methamphetamine UR: NOT DETECTED
POC Morphine: NOT DETECTED
POC Oxazepam (BZO): NOT DETECTED
POC Oxycodone UR: NOT DETECTED
POC Secobarbital (BAR): NOT DETECTED

## 2023-02-24 LAB — CBC WITH DIFFERENTIAL/PLATELET
Abs Immature Granulocytes: 0.01 10*3/uL (ref 0.00–0.07)
Basophils Absolute: 0 10*3/uL (ref 0.0–0.1)
Basophils Relative: 1 %
Eosinophils Absolute: 0.1 10*3/uL (ref 0.0–0.5)
Eosinophils Relative: 2 %
HCT: 37.5 % (ref 36.0–46.0)
Hemoglobin: 11.4 g/dL — ABNORMAL LOW (ref 12.0–15.0)
Immature Granulocytes: 0 %
Lymphocytes Relative: 25 %
Lymphs Abs: 0.8 10*3/uL (ref 0.7–4.0)
MCH: 28.4 pg (ref 26.0–34.0)
MCHC: 30.4 g/dL (ref 30.0–36.0)
MCV: 93.3 fL (ref 80.0–100.0)
Monocytes Absolute: 0.1 10*3/uL (ref 0.1–1.0)
Monocytes Relative: 4 %
Neutro Abs: 2.3 10*3/uL (ref 1.7–7.7)
Neutrophils Relative %: 68 %
Platelets: 100 10*3/uL — ABNORMAL LOW (ref 150–400)
RBC: 4.02 MIL/uL (ref 3.87–5.11)
RDW: 19 % — ABNORMAL HIGH (ref 11.5–15.5)
WBC: 3.3 10*3/uL — ABNORMAL LOW (ref 4.0–10.5)
nRBC: 0 % (ref 0.0–0.2)

## 2023-02-24 LAB — POC URINE PREG, ED: Preg Test, Ur: NEGATIVE

## 2023-02-24 LAB — FERRITIN: Ferritin: 11 ng/mL (ref 11–307)

## 2023-02-24 LAB — TSH: TSH: 0.809 u[IU]/mL (ref 0.350–4.500)

## 2023-02-24 LAB — LIPID PANEL
Cholesterol: 221 mg/dL — ABNORMAL HIGH (ref 0–200)
HDL: 49 mg/dL (ref >40)
LDL Cholesterol: 151 mg/dL — ABNORMAL HIGH (ref 0–99)
Total CHOL/HDL Ratio: 4.5 ratio
Triglycerides: 103 mg/dL (ref <150)
VLDL: 21 mg/dL (ref 0–40)

## 2023-02-24 MED ORDER — NAPROXEN 500 MG PO TABS: 500.0000 mg | ORAL_TABLET | Freq: Two times a day (BID) | ORAL | Status: DC | PRN

## 2023-02-24 MED ORDER — LOPERAMIDE HCL 2 MG PO CAPS: 2.0000 mg | ORAL_CAPSULE | ORAL | Status: DC | PRN

## 2023-02-24 MED ORDER — MAGNESIUM HYDROXIDE 400 MG/5ML PO SUSP: 30.0000 mL | Freq: Every day | ORAL | Status: DC | PRN

## 2023-02-24 MED ORDER — HYDROXYZINE HCL 25 MG PO TABS
25.0000 mg | ORAL_TABLET | Freq: Four times a day (QID) | ORAL | Status: DC | PRN
  Administered 2023-02-24: 25 mg via ORAL
  Filled 2023-02-24: qty 1

## 2023-02-24 MED ORDER — ACETAMINOPHEN 325 MG PO TABS: 650.0000 mg | ORAL_TABLET | Freq: Four times a day (QID) | ORAL | Status: DC | PRN

## 2023-02-24 MED ORDER — METHOCARBAMOL 500 MG PO TABS: 500.0000 mg | ORAL_TABLET | Freq: Three times a day (TID) | ORAL | Status: DC | PRN

## 2023-02-24 MED ORDER — VITAMIN C 500 MG PO TABS
2000.0000 mg | ORAL_TABLET | Freq: Two times a day (BID) | ORAL | Status: DC
  Administered 2023-02-25: 2000 mg via ORAL
  Filled 2023-02-24: qty 4

## 2023-02-24 MED ORDER — HYDROXYZINE HCL 25 MG PO TABS: 25.0000 mg | ORAL_TABLET | Freq: Four times a day (QID) | ORAL | Status: DC | PRN

## 2023-02-24 MED ORDER — HYDROXYZINE HCL 25 MG PO TABS: 25.0000 mg | ORAL_TABLET | Freq: Three times a day (TID) | ORAL | Status: DC | PRN

## 2023-02-24 MED ORDER — ONDANSETRON 4 MG PO TBDP: 4.0000 mg | ORAL_TABLET | Freq: Four times a day (QID) | ORAL | Status: DC | PRN

## 2023-02-24 MED ORDER — NAPROXEN 500 MG PO TABS
500.0000 mg | ORAL_TABLET | Freq: Two times a day (BID) | ORAL | Status: DC | PRN
  Administered 2023-02-25: 500 mg via ORAL
  Filled 2023-02-24: qty 1

## 2023-02-24 MED ORDER — URSODIOL 500 MG PO TABS: 500.0000 mg | ORAL_TABLET | Freq: Three times a day (TID) | ORAL | Status: DC

## 2023-02-24 MED ORDER — DICYCLOMINE HCL 20 MG PO TABS: 20.0000 mg | ORAL_TABLET | Freq: Four times a day (QID) | ORAL | Status: DC | PRN

## 2023-02-24 MED ORDER — ALUM & MAG HYDROXIDE-SIMETH 200-200-20 MG/5ML PO SUSP: 30.0000 mL | ORAL | Status: DC | PRN

## 2023-02-24 MED ORDER — PROMETHAZINE HCL 25 MG PO TABS
25.0000 mg | ORAL_TABLET | Freq: Four times a day (QID) | ORAL | Status: DC | PRN
  Administered 2023-02-24: 25 mg via ORAL
  Filled 2023-02-24: qty 1

## 2023-02-24 MED ORDER — DICYCLOMINE HCL 20 MG PO TABS
20.0000 mg | ORAL_TABLET | Freq: Four times a day (QID) | ORAL | Status: DC | PRN
  Administered 2023-02-25: 20 mg via ORAL
  Filled 2023-02-24: qty 1

## 2023-02-24 MED ORDER — FAMOTIDINE 20 MG PO TABS
40.0000 mg | ORAL_TABLET | Freq: Two times a day (BID) | ORAL | Status: DC
  Administered 2023-02-24 – 2023-02-25 (×2): 40 mg via ORAL
  Filled 2023-02-24 (×2): qty 2

## 2023-02-24 MED ORDER — VONOPRAZAN FUMARATE 20 MG PO TABS: 1.0000 | ORAL_TABLET | Freq: Every day | ORAL | Status: DC

## 2023-02-24 MED ORDER — ALBUTEROL SULFATE HFA 108 (90 BASE) MCG/ACT IN AERS: 2.0000 | INHALATION_SPRAY | RESPIRATORY_TRACT | Status: DC | PRN

## 2023-02-24 MED ORDER — GABAPENTIN 300 MG PO CAPS
300.0000 mg | ORAL_CAPSULE | Freq: Two times a day (BID) | ORAL | Status: DC
  Administered 2023-02-24 – 2023-02-25 (×2): 300 mg via ORAL
  Filled 2023-02-24 (×2): qty 1

## 2023-02-24 MED ORDER — LOPERAMIDE HCL 2 MG PO CAPS
2.0000 mg | ORAL_CAPSULE | ORAL | Status: DC | PRN
  Administered 2023-02-25: 2 mg via ORAL
  Filled 2023-02-24: qty 1

## 2023-02-24 MED ORDER — TRAZODONE HCL 50 MG PO TABS
50.0000 mg | ORAL_TABLET | Freq: Every evening | ORAL | Status: DC | PRN
  Administered 2023-02-24: 50 mg via ORAL
  Filled 2023-02-24: qty 1

## 2023-02-24 MED ORDER — NITROFURANTOIN MONOHYD MACRO 100 MG PO CAPS
100.0000 mg | ORAL_CAPSULE | Freq: Two times a day (BID) | ORAL | Status: DC
  Administered 2023-02-24 – 2023-02-25 (×2): 100 mg via ORAL
  Filled 2023-02-24 (×2): qty 1

## 2023-02-24 NOTE — ED Notes (Signed)
Patient admitted to obs bed 5. Bubbly affect. And talkative. A&O x 4. Skin intact. Denies SI, HI, AVH.

## 2023-02-24 NOTE — ED Notes (Signed)
Pt sleeping@this time breathing even and unlabored will continue to monitor for safety 
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
## 2023-02-24 NOTE — Discharge Instructions (Addendum)
Intensive Outpatient treatment is recommended: The following offer this treatment option:  [facility]  - Debarah Crape, LCSW is consulting with her team regarding any potential conflict of interest since you know her on somewhat of a personal basis.  Please call [phone] to follow up to determine if you can be accepted into the program.  Additional program option - [facility] - 1 [phone] People in crisis are struggling to find the mental health treatment they need. At [facility], we're creating serious, effective, and accessible solutions. Our treatment programs, including our virtual Intensive Outpatient Program (IOP), combine personalized care with peer connection to foster long-term healing.  Discharge recommendations:   Medications: Patient is to take medications as prescribed. The patient or patient's guardian is to contact a medical professional and/or outpatient provider to address any new side effects that develop. The patient or the patient's guardian should update outpatient providers of any new medications and/or medication changes.    Outpatient Follow up: Please review list of outpatient resources for psychiatry and counseling. Please follow up with your primary care provider for all medical related needs.    Therapy: We recommend that patient participate in individual therapy to address mental health concerns.   Atypical antipsychotics: If you are prescribed an atypical antipsychotic, it is recommended that your height, weight, BMI, blood pressure, fasting lipid panel, and fasting blood sugar be monitored by your outpatient providers.  Safety:   The following safety precautions should be taken:   No sharp objects. This includes scissors, razors, scrapers, and putty knives.   Chemicals should be removed and locked up.   Medications should be removed and locked up.   Weapons should be removed and locked up. This includes firearms, knives and  instruments that can be used to cause injury.   The patient should abstain from use of illicit substances/drugs and abuse of any medications.  If symptoms worsen or do not continue to improve or if the patient becomes actively suicidal or homicidal then it is recommended that the patient return to the closest hospital emergency department, the [facility], or call 911 for further evaluation and treatment. National Suicide Prevention Lifeline 1-800-SUICIDE or 816-135-6650.  About 988 988 offers 24/7 access to trained crisis counselors who can help people experiencing mental health-related distress. People can call or text 988 or chat 988lifeline.org for themselves or if they are worried about a loved one who may need crisis support.

## 2023-02-24 NOTE — ED Provider Notes (Signed)
[facility] Continuous Assessment Admission H&P  Date: 02/24/23 Linda Becker Name: Linda Becker MRN: [id number] Chief Complaint: presents at the recommendation of family members due to Linda Becker lying about her pregnancy over the past year.  Diagnoses:  Final diagnoses:  Bipolar 2 disorder (HCC)    HPI: Linda Becker presented to [facility] as a walk in  accompanied by her sister, her sisters spouse, her friend and her friend's husband.  Linda Becker presents at the recommended of friends and family due to Linda Becker lying about her pregnancy over the past year.  Linda Becker, 32 y.o., female Linda Becker seen face to face by this provider, consulted with Dr. Rebecca Eaton; and chart reviewed on 02/24/23.  Per chart review Linda Becker has a past psychiatric history of bipolar 2, PTSD, anxiety, and depression.  She is not prescribed any psychiatric medications and has not been for over 3 years.  She has a medical history significant for hepatic cirrhosis, tachycardia, miscarriage, thrombocytopenia, upper GI bleed and iron deficiency anemia.  Linda Becker reports she is prescribed multiple medications and admits she is not compliant with medications.  She has no outpatient psychiatric services in place.  She denies any previous suicide attempts.  She denies any past psychiatric admissions.  Per chart review Linda Becker presented to the MAU on 9/92024 reports that she had a miscarriage in May of this year but did not inform her husband.  Her husband was out in the lobby with bags waiting for her to be admitted.  Also during that visit she made a statement of "I wish you could make a baby appear".  Suspicious activity for abduction risk and security was alerted.  Linda Becker reports she is now banned from [facility].  In addition Linda Becker presented to [facility] on 02/23/2023 stating that she was there to give birth.  Once they realized that Linda Becker was not pregnant security was called to the floor.  Per chart review  Linda Becker told staff that she knew she was not pregnant she was just doing what she had a do to get by.  During evaluation Linda Becker is is observed sitting in the assessment room in no acute distress.  She is disheveled and makes fleeting eye contact.  She is alert/oriented x 4, cooperative, and fairly attentive.  Her speech is somewhat pressured and she is tangential and has to be redirected throughout assessment.  She endorses depression with feelings of guilt, hopelessness, decreased appetite, decreased sleep, irritability, tearfulness, low motivation and worthlessness.  She reports only sleeping 1-2 hours over the past 3 days.  She has a depressed affect.  States, "I cannot see things getting any better".  Her husband left her yesterday after finding out that she had lied over the past 8 months about being pregnant.  Linda Becker states that she "made a mess" by not telling him after she had a miscarriage around [redacted] weeks gestation.  However per chart review Linda Becker had a negative pregnancy test 04/2022.  In addition her sister states that Linda Becker was told after her miscarriage in 2019 that she can no longer carry her children.  However Linda Becker is delusional and does believe that she was pregnant and miscarried. She also thinks she had other later term miscarriages.  However there is only 1 documented miscarriage in 2019 when Linda Becker did carry to 36 weeks and had a stillborn delivery.  Linda Becker does admit that this along with losing her mother in 2021 has "wrecked my life".  She denies suicidal/homicidal ideations.  Linda Becker tends to be somewhat focused and delusional as far as her medical diagnoses.  She believes that she has bone cancer.  She denies auditory/visual hallucinations.  She does not appear to be responding to internal/external stimuli. Linda Becker initially denied any substance use.  However she then states that she sees a pain management clinic and is prescribed opioid medications.  However she says  she borrowed half of a fentanyl patch from her grandfather.  Linda Becker is not forthcoming and very guarded when it comes to discussing substance use.  Per PDMP Linda Becker was prescribed acetaminophen with codeine 11/2022.   Collateral obtained with Merilynn Finland, [facility], "Linda Becker's sister, Sterling Big, shares concern that she believes Linda Becker was never pregnant and that Linda Becker is "delusional."  She shares that Linda Becker has been quite detailed in creating this story of pregnancy, to include a picture of "a wet seat" stating her water broke and then sending pictures of ultrasounds to family with dates cut off.  Per Sterling Big, the ultrasounds were actually ultrasounds from her previous pregnancy(clearly a female); a son she lost born stillborn in 2019. Linda Becker's sister was willing to take Linda Becker in after Linda Becker's husband left her.  She is hoping Linda Becker will be admitted for inpatient treatment, as she is concerned Linda Becker may be misdiagnosed.  She believes Linda Becker is "delusional, psychotic and maybe Schizophrenic."  Sister called Linda Becker's OB/GYN was told that Linda Becker was never pregnant.  Also states that over the past 2 days Linda Becker has made multiple comments about not wanting to live.  She also states she wants to starve herself to death.  Per sister Linda Becker has not eaten or drink anything in 3 days.  Sister is concerned that Linda Becker is a harm to herself.  States Linda Becker will go for a week without showering.  In addition she is concerned due to Linda Becker's opiate abuse.  Linda Becker has been stealing fentanyl patches and opioid oral medications from her grandfather.  Linda Becker sister found a Ziploc bag full of over 50 different medications that Linda Becker had hidden in her bag.  Sister is unsure of how much opioid medication that Linda Becker is consuming daily.   Linda Becker's best friend, Hetty Ely, has also shared concerns that paitent's story has been confusing and inconsistent for quite some time.  She has continued to provide support and  has encouraged Linda Becker to present today after everyone learned that she has not been pregnant these past few months.  She is concerned Linda Becker may be "delusional."  She shares Linda Becker has been quite inconsistent with she has challenged her.  Upon assessment, Linda Becker informed clinician and provider she lost her baby at 20 weeks.  She told Hetty Ely she she recently miscarried, and the baby was 8 lbs 4 oz.  She told Hetty Ely she did not hold the baby and told another friend she did.  Linda Becker did not want to discuss "what she did with the body" as Linda Becker shared she miscarried at home.  Hetty Ely is convinced Linda Becker was either never pregnant or if she was, she miscarried early.  She is concerned for Linda Becker's mental health, as she has watched her decline during this time.  She is hoping Linda Becker will be admitted for further evaluation and treatment."   Discussed inpatient psychiatric admission with Linda Becker and she is in agreement.  She will remain voluntary at this time    Total Time spent with Linda Becker: 1 hour  Musculoskeletal  Strength & Muscle Tone: within normal limits Gait & Station: normal Linda Becker leans: N/A  Psychiatric Specialty Exam  Presentation General Appearance:  Casual  Eye Contact: Fleeting  Speech: Clear and Coherent; Normal Rate  Speech Volume:No data recorded Handedness: Right   Mood and Affect  Mood: Depressed; Anxious  Affect: Congruent   Thought Process  Thought Processes: Coherent  Descriptions of Associations:Tangential  Orientation:Full (Time, Place and Person)  Thought Content:Delusions  Diagnosis of Schizophrenia or Schizoaffective disorder in past: No   Hallucinations:Hallucinations: None  Ideas of Reference:None  Suicidal Thoughts:Suicidal Thoughts: No  Homicidal Thoughts:Homicidal Thoughts: No   Sensorium  Memory: Immediate Good; Recent Good; Remote Good  Judgment: Poor  Insight: Poor   Executive Functions   Concentration: Fair  Attention Span: Fair  Recall: Good  Fund of Knowledge: Good  Language: Good   Psychomotor Activity  Psychomotor Activity: Psychomotor Activity: Normal   Assets  Assets: Financial Resources/Insurance; Physical Health; Resilience; Social Support   Sleep  Sleep: Sleep: Poor Number of Hours of Sleep: 2   Nutritional Assessment (For OBS and FBC admissions only) Has the Linda Becker had a weight loss or gain of 10 pounds or more in the last 3 months?: Yes Has the Linda Becker had a decrease in food intake/or appetite?: Yes Does the Linda Becker have dental problems?: No Does the Linda Becker have eating habits or behaviors that may be indicators of an eating disorder including binging or inducing vomiting?: No Has the Linda Becker recently lost weight without trying?: 4 Has the Linda Becker been eating poorly because of a decreased appetite?: 1 Malnutrition Screening Tool Score: 5    Physical Exam Vitals and nursing note reviewed.  Constitutional:      General: She is not in acute distress.    Appearance: Normal appearance. She is not ill-appearing.  HENT:     Head: Normocephalic.  Eyes:     General:        Right eye: No discharge.        Left eye: No discharge.  Cardiovascular:     Rate and Rhythm: Normal rate.  Pulmonary:     Effort: Pulmonary effort is normal.  Musculoskeletal:        General: Normal range of motion.     Cervical back: Normal range of motion.  Skin:    Coloration: Skin is not jaundiced or pale.  Neurological:     Mental Status: She is alert and oriented to person, place, and time.  Psychiatric:        Attention and Perception: Attention and perception normal.        Mood and Affect: Affect normal. Mood is anxious and depressed.        Speech: Speech is tangential.        Behavior: Behavior normal. Behavior is cooperative.        Thought Content: Thought content is delusional.        Cognition and Memory: Cognition normal.        Judgment:  Judgment is impulsive.    Review of Systems  Constitutional: Negative.   HENT: Negative.    Eyes: Negative.   Respiratory: Negative.    Cardiovascular: Negative.   Musculoskeletal: Negative.   Skin: Negative.   Neurological: Negative.   Psychiatric/Behavioral:  Positive for depression and substance abuse. The Linda Becker is nervous/anxious and has insomnia.     Blood pressure (!) 82/65, pulse (!) 107, temperature 98.5 F (36.9 C), temperature source Oral, resp. rate 18, SpO2 100%. There is no height or weight on file to calculate BMI.  Past Psychiatric History: Bipolar 2 disorder   Is the Linda Becker at  risk to self? Yes  Has the Linda Becker been a risk to self in the past 6 months? Yes .    Has the Linda Becker been a risk to self within the distant past? No   Is the Linda Becker a risk to others? No   Has the Linda Becker been a risk to others in the past 6 months? No   Has the Linda Becker been a risk to others within the distant past? No   Past Medical History:  Past Medical History:  Diagnosis Date   Allergic rhinitis    remote hx of allergy shots   Asthma    since childhood  on controller meds extrinsic dr Sharyn Lull   Bipolar II disorder (HCC)    Cirrhosis (HCC)    Erosive esophagitis    Esophageal varices (HCC)    Family history of adverse reaction to anesthesia    mom and sister difficult to awaken per Linda Becker    Gallbladder sludge    Gastropathy    GERD (gastroesophageal reflux disease)    on nexium  for  long term sx since childhood   H/O miscarriage, not currently pregnant    [redacted] weeks  march 2016   Hepatic steatosis    Hiatal hernia    Hypothyroidism    Migraine    Murmur    pt reports MVP   Portal hypertension (HCC)    Sessile colonic polyp    Splenomegaly    Steatohepatitis    Syncope    under eval ? cause   Tachycardia    episodes with near syncope eval dr Nadara Eaton  on no meds dced LABA   Thrombocytopenia (HCC)    Upper GI bleed      Family History: unknown  Social History:  Married-however spouse left her yesterday after he found out that she had lied about being pregnant Unemployed and on disability due to medical reasons Opioid use  Last Labs:  Appointment on 01/21/2023  Component Date Value Ref Range Status   INR 01/21/2023 1.3 (H)  0.8 - 1.0 ratio Final   Prothrombin Time 01/21/2023 13.4 (H)  9.6 - 13.1 sec Final   WBC 01/21/2023 2.6 (L)  4.0 - 10.5 K/uL Final   RBC 01/21/2023 3.98  3.87 - 5.11 Mil/uL Final   Hemoglobin 01/21/2023 10.0 (L)  12.0 - 15.0 g/dL Final   HCT 84/16/6063 33.9 (L)  36.0 - 46.0 % Final   MCV 01/21/2023 85.2  78.0 - 100.0 fl Final   MCHC 01/21/2023 29.6 (L)  30.0 - 36.0 g/dL Final   RDW 01/60/1093 24.5 (H)  11.5 - 15.5 % Final   Platelets 01/21/2023 72.0 (L)  150.0 - 400.0 K/uL Final   Neutrophils Relative % 01/21/2023 73.5  43.0 - 77.0 % Final   Lymphocytes Relative 01/21/2023 18.0  12.0 - 46.0 % Final   Monocytes Relative 01/21/2023 5.1  3.0 - 12.0 % Final   Eosinophils Relative 01/21/2023 3.1  0.0 - 5.0 % Final   Basophils Relative 01/21/2023 0.3  0.0 - 3.0 % Final   Neutro Abs 01/21/2023 1.9  1.4 - 7.7 K/uL Final   Lymphs Abs 01/21/2023 0.5 (L)  0.7 - 4.0 K/uL Final   Monocytes Absolute 01/21/2023 0.1  0.1 - 1.0 K/uL Final   Eosinophils Absolute 01/21/2023 0.1  0.0 - 0.7 K/uL Final   Basophils Absolute 01/21/2023 0.0  0.0 - 0.1 K/uL Final   Sodium 01/21/2023 142  135 - 145 mEq/L Final   Potassium 01/21/2023 4.5  3.5 -  5.1 mEq/L Final   Chloride 01/21/2023 114 (H)  96 - 112 mEq/L Final   CO2 01/21/2023 20  19 - 32 mEq/L Final   Glucose, Bld 01/21/2023 96  70 - 99 mg/dL Final   BUN 30/86/5784 10  6 - 23 mg/dL Final   Creatinine, Ser 01/21/2023 0.97  0.40 - 1.20 mg/dL Final   Total Bilirubin 01/21/2023 0.5  0.2 - 1.2 mg/dL Final   Alkaline Phosphatase 01/21/2023 56  39 - 117 U/L Final   AST 01/21/2023 13  0 - 37 U/L Final   ALT 01/21/2023 12  0 - 35 U/L Final   Total Protein 01/21/2023 6.4  6.0 - 8.3 g/dL Final    Albumin 69/62/9528 4.1  3.5 - 5.2 g/dL Final   GFR 41/32/4401 78.58  >60.00 mL/min Final   Calculated using the CKD-EPI Creatinine Equation (2021)   Calcium 01/21/2023 8.9  8.4 - 10.5 mg/dL Final  Orders Only on 02/72/5366  Component Date Value Ref Range Status   ABO/RH(D) 01/19/2023 O POS   Final   Antibody Screen 01/19/2023 NEG   Final   Sample Expiration 01/19/2023 01/22/2023,2359   Final   Unit Number 01/19/2023 [id number]   Final   Blood Component Type 01/19/2023 RBC LR PHER2   Final   Unit division 01/19/2023 00   Final   Status of Unit 01/19/2023 ISSUED,FINAL   Final   Transfusion Status 01/19/2023 OK TO TRANSFUSE   Final   Crossmatch Result 01/19/2023    Final                   Value:Compatible Performed at [facility], [address] [place], [place] [address]    Unit Number 01/19/2023 [id number]   Final   Blood Component Type 01/19/2023 RBC LR PHER1   Final   Unit division 01/19/2023 00   Final   Status of Unit 01/19/2023 [facility]   Final   Transfusion Status 01/19/2023 OK TO TRANSFUSE   Final   Crossmatch Result 01/19/2023 Compatible   Final   Order Confirmation 01/19/2023    Final                   Value:ORDER PROCESSED BY BLOOD BANK Performed at [facility], [address], [place], [place] [address]    ISSUE DATE / TIME 01/19/2023 [id number]   Final   Blood Product Unit Number 01/19/2023 [id number]   Final   PRODUCT CODE 01/19/2023 [id number]   Final   Unit Type and Rh 01/19/2023 5100   Final   Blood Product Expiration Date 01/19/2023 [id number]   Final   ISSUE DATE / TIME 01/19/2023 [id number]   Final   Blood Product Unit Number 01/19/2023 [id number]   Final   PRODUCT CODE 01/19/2023 [id number]   Final   Unit Type and Rh 01/19/2023 5100   Final   Blood Product Expiration Date 01/19/2023 [id number]   Final  Appointment on 01/19/2023  Component Date Value Ref Range Status   Blood Bank Specimen 01/19/2023  SAMPLE AVAILABLE FOR TESTING   Final   Sample Expiration 01/19/2023    Final                   Value:01/22/2023,2359 Performed at [facility], [address], [place], [place] [address]    WBC Count 01/19/2023 2.1 (L)  4.0 - 10.5 K/uL Final   RBC 01/19/2023 3.08 (L)  3.87 - 5.11 MIL/uL Final   Hemoglobin 01/19/2023  7.4 (L)  12.0 - 15.0 g/dL Final   HCT 16/03/9603 26.5 (L)  36.0 - 46.0 % Final   MCV 01/19/2023 86.0  80.0 - 100.0 fL Final   MCH 01/19/2023 24.0 (L)  26.0 - 34.0 pg Final   MCHC 01/19/2023 27.9 (L)  30.0 - 36.0 g/dL Final   RDW 54/02/8118 22.5 (H)  11.5 - 15.5 % Final   Platelet Count 01/19/2023 71 (L)  150 - 400 K/uL Final   nRBC 01/19/2023 0.0  0.0 - 0.2 % Final   Neutrophils Relative % 01/19/2023 68  % Final   Neutro Abs 01/19/2023 1.4 (L)  1.7 - 7.7 K/uL Final   Lymphocytes Relative 01/19/2023 23  % Final   Lymphs Abs 01/19/2023 0.5 (L)  0.7 - 4.0 K/uL Final   Monocytes Relative 01/19/2023 4  % Final   Monocytes Absolute 01/19/2023 0.1  0.1 - 1.0 K/uL Final   Eosinophils Relative 01/19/2023 3  % Final   Eosinophils Absolute 01/19/2023 0.1  0.0 - 0.5 K/uL Final   Basophils Relative 01/19/2023 1  % Final   Basophils Absolute 01/19/2023 0.0  0.0 - 0.1 K/uL Final   Immature Granulocytes 01/19/2023 1  % Final   Abs Immature Granulocytes 01/19/2023 0.01  0.00 - 0.07 K/uL Final   Performed at [facility], [address], [place], [place] [address]  Appointment on 01/13/2023  Component Date Value Ref Range Status   WBC 01/13/2023 2.7 (L)  4.0 - 10.5 K/uL Final   RBC 01/13/2023 3.25 (L)  3.87 - 5.11 Mil/uL Final   Hemoglobin 01/13/2023 7.1 cL (LL)  12.0 - 15.0 g/dL Final   HCT 95/62/1308 24.8 aL (L)  36.0 - 46.0 % Final   MCV 01/13/2023 76.1 (L)  78.0 - 100.0 fl Final   MCHC 01/13/2023 28.7 aL (L)  30.0 - 36.0 g/dL Final   RDW 65/78/4696 17.8 (H)  11.5 - 15.5 % Final   Platelets 01/13/2023 81.0 (L)  150.0 - 400.0 K/uL Final    Neutrophils Relative % 01/13/2023 63.9  43.0 - 77.0 % Final   Lymphocytes Relative 01/13/2023 24.6  12.0 - 46.0 % Final   Monocytes Relative 01/13/2023 7.2  3.0 - 12.0 % Final   Eosinophils Relative 01/13/2023 3.3  0.0 - 5.0 % Final   Basophils Relative 01/13/2023 1.0  0.0 - 3.0 % Final   Neutro Abs 01/13/2023 1.7  1.4 - 7.7 K/uL Final   Lymphs Abs 01/13/2023 0.7  0.7 - 4.0 K/uL Final   Monocytes Absolute 01/13/2023 0.2  0.1 - 1.0 K/uL Final   Eosinophils Absolute 01/13/2023 0.1  0.0 - 0.7 K/uL Final   Basophils Absolute 01/13/2023 0.0  0.0 - 0.1 K/uL Final  Appointment on 01/06/2023  Component Date Value Ref Range Status   AFP-Tumor Marker 01/06/2023 1.2  ng/mL Final   Comment: Reference Range:   <6.1 The use of AFP as a tumor marker in pregnant females is not recommended. . . This test was performed using the Beckman Coulter chemiluminescent method. Values obtained from different assay methods cannot be used interchangeably. AFP levels, regardless of value, should not be interpreted as absolute evidence of the presence or absence of disease. .    INR 01/06/2023 1.3 (H)  0.8 - 1.0 ratio Final   Prothrombin Time 01/06/2023 13.4 (H)  9.6 - 13.1 sec Final   Sodium 01/06/2023 138  135 - 145 mEq/L Final   Potassium 01/06/2023 4.2  3.5 - 5.1 mEq/L  Final   Chloride 01/06/2023 109  96 - 112 mEq/L Final   CO2 01/06/2023 24  19 - 32 mEq/L Final   Glucose, Bld 01/06/2023 91  70 - 99 mg/dL Final   BUN 16/03/9603 9  6 - 23 mg/dL Final   Creatinine, Ser 01/06/2023 0.81  0.40 - 1.20 mg/dL Final   Total Bilirubin 01/06/2023 0.4  0.2 - 1.2 mg/dL Final   Alkaline Phosphatase 01/06/2023 45  39 - 117 U/L Final   AST 01/06/2023 15  0 - 37 U/L Final   ALT 01/06/2023 10  0 - 35 U/L Final   Total Protein 01/06/2023 5.4 (L)  6.0 - 8.3 g/dL Final   Albumin 54/02/8118 3.5  3.5 - 5.2 g/dL Final   GFR 14/78/2956 97.59  >60.00 mL/min Final   Calculated using the CKD-EPI Creatinine Equation (2021)    Calcium 01/06/2023 8.1 (L)  8.4 - 10.5 mg/dL Final   Iron 21/30/8657 13 (L)  42 - 145 ug/dL Final   Transferrin 84/69/6295 328.0  212.0 - 360.0 mg/dL Final   Saturation Ratios 01/06/2023 2.8 (L)  20.0 - 50.0 % Final   Ferritin 01/06/2023 2.9 (L)  10.0 - 291.0 ng/mL Final   TIBC 01/06/2023 459.2 (H)  250.0 - 450.0 mcg/dL Final   WBC 28/41/3244 2.6 (L)  4.0 - 10.5 K/uL Final   RBC 01/06/2023 3.13 (L)  3.87 - 5.11 Mil/uL Final   Hemoglobin 01/06/2023 7.0 Repeated and verified X2. (LL)  12.0 - 15.0 g/dL Final   HCT 06/18/7251 24.2 aL (L)  36.0 - 46.0 % Final   MCV 01/06/2023 77.4 (L)  78.0 - 100.0 fl Final   MCHC 01/06/2023 28.9 aL (L)  30.0 - 36.0 g/dL Final   RDW 66/44/0347 17.6 (H)  11.5 - 15.5 % Final   Platelets 01/06/2023 71.0 (L)  150.0 - 400.0 K/uL Final   Neutrophils Relative % 01/06/2023 57.6  43.0 - 77.0 % Final   Lymphocytes Relative 01/06/2023 29.9  12.0 - 46.0 % Final   Monocytes Relative 01/06/2023 7.0  3.0 - 12.0 % Final   Eosinophils Relative 01/06/2023 4.3  0.0 - 5.0 % Final   Basophils Relative 01/06/2023 1.2  0.0 - 3.0 % Final   Neutro Abs 01/06/2023 1.5  1.4 - 7.7 K/uL Final   Lymphs Abs 01/06/2023 0.8  0.7 - 4.0 K/uL Final   Monocytes Absolute 01/06/2023 0.2  0.1 - 1.0 K/uL Final   Eosinophils Absolute 01/06/2023 0.1  0.0 - 0.7 K/uL Final   Basophils Absolute 01/06/2023 0.0  0.0 - 0.1 K/uL Final  Appointment on 08/27/2022  Component Date Value Ref Range Status   TSH 08/27/2022 3.05  0.35 - 5.50 uIU/mL Final   Vitamin B-12 08/27/2022 273  211 - 911 pg/mL Final   Folate 08/27/2022 >23.9  >5.9 ng/mL Final   Iron 08/27/2022 20 (L)  42 - 145 ug/dL Final   Transferrin 42/59/5638 320.0  212.0 - 360.0 mg/dL Final   Saturation Ratios 08/27/2022 4.5 (L)  20.0 - 50.0 % Final   Ferritin 08/27/2022 5.9 (L)  10.0 - 291.0 ng/mL Final   TIBC 08/27/2022 448.0  250.0 - 450.0 mcg/dL Final   INR 75/64/3329 1.0  0.8 - 1.0 ratio Final   Prothrombin Time 08/27/2022 11.3  9.6 - 13.1 sec  Final   WBC 08/27/2022 3.5 (L)  4.0 - 10.5 K/uL Final   RBC 08/27/2022 3.67 (L)  3.87 - 5.11 Mil/uL Final   Hemoglobin 08/27/2022 9.0 (L)  12.0 -  15.0 g/dL Corrected   Rechecked and verified result.   HCT 08/27/2022 28.7 (L)  36.0 - 46.0 % Final   MCV 08/27/2022 78.2  78.0 - 100.0 fl Final   MCHC 08/27/2022 31.4  30.0 - 36.0 g/dL Final   RDW 57/84/6962 17.6 (H)  11.5 - 15.5 % Final   Platelets 08/27/2022 86.0 (L)  150.0 - 400.0 K/uL Final   Neutrophils Relative % 08/27/2022 64.3  43.0 - 77.0 % Final   Lymphocytes Relative 08/27/2022 23.4  12.0 - 46.0 % Final   Monocytes Relative 08/27/2022 7.3  3.0 - 12.0 % Final   Eosinophils Relative 08/27/2022 4.5  0.0 - 5.0 % Final   Basophils Relative 08/27/2022 0.5  0.0 - 3.0 % Final   Neutro Abs 08/27/2022 2.2  1.4 - 7.7 K/uL Final   Lymphs Abs 08/27/2022 0.8  0.7 - 4.0 K/uL Final   Monocytes Absolute 08/27/2022 0.3  0.1 - 1.0 K/uL Final   Eosinophils Absolute 08/27/2022 0.2  0.0 - 0.7 K/uL Final   Basophils Absolute 08/27/2022 0.0  0.0 - 0.1 K/uL Final   Sodium 08/27/2022 139  135 - 145 mEq/L Final   Potassium 08/27/2022 4.5  3.5 - 5.1 mEq/L Final   Chloride 08/27/2022 111  96 - 112 mEq/L Final   CO2 08/27/2022 21  19 - 32 mEq/L Final   Glucose, Bld 08/27/2022 90  70 - 99 mg/dL Final   BUN 95/28/4132 15  6 - 23 mg/dL Final   Creatinine, Ser 08/27/2022 0.88  0.40 - 1.20 mg/dL Final   Total Bilirubin 08/27/2022 0.3  0.2 - 1.2 mg/dL Final   Alkaline Phosphatase 08/27/2022 45  39 - 117 U/L Final   AST 08/27/2022 15  0 - 37 U/L Final   ALT 08/27/2022 10  0 - 35 U/L Final   Total Protein 08/27/2022 5.8 (L)  6.0 - 8.3 g/dL Final   Albumin 44/06/270 3.7  3.5 - 5.2 g/dL Final   GFR 53/66/4403 88.57  >60.00 mL/min Final   Calculated using the CKD-EPI Creatinine Equation (2021)   Calcium 08/27/2022 8.6  8.4 - 10.5 mg/dL Final    Allergies: Progesterone and Penicillins  Medications:  Facility Ordered Medications  Medication   promethazine  (PHENERGAN) suppository 12.5-25 mg   iron sucrose (VENOFER) 200 mg in sodium chloride 0.9 % 100 mL IVPB   famotidine (PEPCID) IVPB 20 mg premix   0.9 %  sodium chloride infusion   methylPREDNISolone sodium succinate (SOLU-MEDROL) 125 mg/2 mL injection 125 mg   diphenhydrAMINE (BENADRYL) injection 50 mg   albuterol (VENTOLIN HFA) 108 (90 Base) MCG/ACT inhaler 2 puff   EPINEPHrine (EPI-PEN) injection 0.3 mg   acetaminophen (TYLENOL) tablet 650 mg   alum & mag hydroxide-simeth (MAALOX/MYLANTA) 200-200-20 MG/5ML suspension 30 mL   magnesium hydroxide (MILK OF MAGNESIA) suspension 30 mL   traZODone (DESYREL) tablet 50 mg   dicyclomine (BENTYL) tablet 20 mg   hydrOXYzine (ATARAX) tablet 25 mg   loperamide (IMODIUM) capsule 2-4 mg   methocarbamol (ROBAXIN) tablet 500 mg   naproxen (NAPROSYN) tablet 500 mg   ondansetron (ZOFRAN-ODT) disintegrating tablet 4 mg   PTA Medications  Medication Sig   Cetirizine HCl 10 MG CAPS Take 10 mg by mouth 2 (two) times daily.    Prenatal Vit-Fe Fumarate-FA (MULTIVITAMIN-PRENATAL) 27-0.8 MG TABS tablet Take 1 tablet by mouth 2 (two) times daily.   Ascorbic Acid (VITAMIN C) 1000 MG tablet Take 2,000 mg by mouth 2 (two) times daily.  CALCIUM PO Take 1 tablet by mouth 2 (two) times daily.   diphenhydrAMINE (BENADRYL) 25 MG tablet Take 50 mg by mouth at bedtime as needed for allergies or sleep.   Albuterol Sulfate, sensor, (PROAIR DIGIHALER) 108 (90 Base) MCG/ACT AEPB Inhale 2 puffs into the lungs every 4 hours as needed (For coughing and wheezing).   albuterol (PROVENTIL) (2.5 MG/3ML) 0.083% nebulizer solution Inhale 1 ampule by nebulization every 4 (four) hours as needed for wheezing or shortness of breath.   budesonide (RHINOCORT AQUA) 32 MCG/ACT nasal spray Place 1 spray into both nostrils in the morning and at bedtime.   ursodiol (ACTIGALL) 500 MG tablet Take 1 tablet (500 mg total) by mouth 3 (three) times daily   XIFAXAN 550 MG TABS tablet Take 1 tablet  (550 mg total) by mouth 2 (two) times daily.   sucralfate (CARAFATE) 1 GM/10ML suspension Take 10 mLs by mouth 4 times daily -  with meals and at bedtime.   nadolol (CORGARD) 40 MG tablet Take 1 tablet (40 mg total) by mouth daily.   cyclobenzaprine (FLEXERIL) 10 MG tablet Take 1 tablet (10 mg total) by mouth 3 (three) times daily as needed for muscle spasms   ergocalciferol (VITAMIN D2) 1.25 MG (50000 UT) capsule Take 1 capsule (50,000 Units total) by mouth once a week.   famotidine (PEPCID) 40 MG tablet Take 1 tablet (40 mg total) by mouth 2 (two) times daily.   albuterol (PROAIR HFA) 108 (90 Base) MCG/ACT inhaler Inhale 2 puffs into the lungs every 4-6 hours as needed.   Vonoprazan Fumarate (VOQUEZNA) 20 MG TABS Take 1 tablet by mouth daily.   gabapentin (NEURONTIN) 300 MG capsule Take 1 capsule (300 mg) by mouth 2 times daily.   verapamil (CALAN) 40 MG tablet Take 40 mg by mouth daily.   promethazine (PHENERGAN) 25 MG suppository Place 1 suppository (25 mg total) rectally every 6 (six) hours as needed for nausea or vomiting.   promethazine (PHENERGAN) 25 MG tablet Take 1 tablet (25 mg total) by mouth every 6 (six) hours as needed.   nitrofurantoin, macrocrystal-monohydrate, (MACROBID) 100 MG capsule Take 1 capsule (100 mg total) by mouth 2 (two) times daily.   fluconazole (DIFLUCAN) 150 MG tablet Take 1 tablet (150 mg total) by mouth every three (3) days as needed.      Medical Decision Making    Linda Becker presents to [facility] with family and friends after lying for roughly 8 months about being pregnant.  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  In addition she has told her sister over the past 2 days that she wants to die and her plan is to starve herself.  Per sister Linda Becker has not eaten or drinking in 3 days Linda Becker will be admitted to the continuous assessment unit while awaiting inpatient psychiatric bed availability  Recommendations  Based on my evaluation the Linda Becker does not  appear to have an emergency medical condition.  Linda Becker will be admitted to the continuous assessment unit while awaiting inpatient psychiatric bed availability Meds ordered this encounter  Medications   acetaminophen (TYLENOL) tablet 650 mg   alum & mag hydroxide-simeth (MAALOX/MYLANTA) 200-200-20 MG/5ML suspension 30 mL   magnesium hydroxide (MILK OF MAGNESIA) suspension 30 mL   DISCONTD: hydrOXYzine (ATARAX) tablet 25 mg   traZODone (DESYREL) tablet 50 mg   dicyclomine (BENTYL) tablet 20 mg   hydrOXYzine (ATARAX) tablet 25 mg   loperamide (IMODIUM) capsule 2-4 mg   methocarbamol (ROBAXIN) tablet 500 mg  naproxen (NAPROSYN) tablet 500 mg   ondansetron (ZOFRAN-ODT) disintegrating tablet 4 mg   albuterol (VENTOLIN HFA) 108 (90 Base) MCG/ACT inhaler 2 puff   ascorbic acid (VITAMIN C) tablet 2,000 mg   famotidine (PEPCID) tablet 40 mg   gabapentin (NEURONTIN) capsule 300 mg   promethazine (PHENERGAN) tablet 25 mg   ursodiol (ACTIGALL) tablet 500 mg   Vonoprazan Fumarate TABS 1 tablet   nitrofurantoin (macrocrystal-monohydrate) (MACROBID) capsule 100 mg    Hold nadol 40 mg tabl daily for Tachycardia, due to BP 82/65 COWS protocol-due to opioid use.   Lab Orders         Ferritin (Iron Binding Protein)         CBC with Differential/Platelet         Comprehensive metabolic panel         Hemoglobin A1c         Urinalysis, Complete w Microscopic -Urine, Clean Catch         TSH         Lipid panel         POC urine preg, ED         POCT Urine Drug Screen - (I-Screen)      EKG    Ardis Hughs, NP 02/24/23  6:38 PM

## 2023-02-24 NOTE — ED Notes (Signed)
Pt A&O x 4, no distress noted, presents with Delusional behavior, family believes pt has stated she is pregnant for months, when she really wasn't.  Husband has left her.  Denies SI, HI or AVH, pt calm & cooperative, awake & resting at present.  Monitoring for safety.

## 2023-02-25 ENCOUNTER — Telehealth (HOSPITAL_COMMUNITY): Payer: Self-pay | Admitting: Professional

## 2023-02-25 NOTE — Progress Notes (Signed)
LCSW Progress Note  [id number]   Linda Becker  02/25/2023  1:37 AM    Inpatient Behavioral Health Placement  Pt meets inpatient criteria per [facility]. There are no available beds within [facility] system per [facility] Rona Ravens, RN. Referral was sent to the following facilities;   Destination  Service Provider Address Phone [facility]  [address] [place], [place] [address] [phone]  [facility]  [address] [place] [address] [phone]  [facility]  [address], [place] [address] [phone]  [facility] Patient [facility]acement  [facility], [place] [phone]  [facility]  [address] [place], [place] [address] [phone]  [facility]  [address] [place] [address] [phone]  [facility]  [address] [place] [address] [phone]  [facility]  [address] [place], [place] [address] [phone]  [facility]  [address] [place] [address] [phone]  [facility]  [address] [place] [address] [phone]  [facility]  [address] [place] [address] [phone]  [facility]  [address], [place] [address] [phone]  [facility]  [address] [place] [address] [phone]  [facility]  [address], [place] [address] [phone]  [facility]  [place] [phone]  [facility]  [address] [place] [address] [phone]  [facility]  [address] [place], [place] [phone]  [facility]  [address], [place] [address] [phone]  [facility]  [address] [place] [address] [phone]  [facility]  [address], [place] [address] [phone]  [facility]  [address] [place], [place] [address] [phone]  [facility]  [address] [place] [address] [phone]  [facility] Inpatient [facility]  [place] [phone]  [facility]  [address] [place] [address] [phone]  [facility]  [address] [place] [address] [phone]  [facility]  [address] [place] [address] [phone]  [facility] [address]  [address] [place] [address] [phone]  [facility]  [address] [place] [address] [phone]    Situation ongoing,  CSW will follow up.    Maryjean Ka, MSW, LCSWA 02/25/2023 1:37 AM

## 2023-02-25 NOTE — ED Notes (Signed)
Pt sleeping@this time breathing even and unlabored will continue to monitor for safety 

## 2023-02-25 NOTE — ED Notes (Signed)
Pt discharged with AVS to sister. AVS reviewed prior to discharge. Pt aware to come back for outpatient services. Pt alert, oriented, and ambulatory. Safety maintained.

## 2023-02-25 NOTE — ED Notes (Signed)
Pt. Resting in bed at the current c eyes closed. Resident denies HI, SI, & AVH. States that she is experiencing head and stomach aches. PRN medication administered. No s/s of acute distress voiced or observed. VSS. Safety maintained. Will continue to monitor and report any COC.

## 2023-02-25 NOTE — ED Notes (Signed)
Pt declined having breakfast, stated her stomach hurts.

## 2023-02-25 NOTE — Progress Notes (Addendum)
Pt was accepted to [facility] TODAY 02/25/2023  Pt meets inpatient criteria per Julaine Fusi     Attending Physician will be Dr. Phyllis Ginger. [place], MD  Report can be called to: [phone]  Pt can arrive after 1:00pm  Care Team notified: Rodney Langton, LPN, Hansel Starling, RN, Bethany Hendta,LCSW  Maryjean Ka, MSW, [facility] 02/25/2023 1:49 AM

## 2023-02-25 NOTE — ED Provider Notes (Signed)
FBC/OBS ASAP Discharge Summary  Date and Time: 02/25/2023 10:41 AM  Name: Linda Becker  MRN:  [id number]   Discharge Diagnoses:  Final diagnoses:  Bipolar 2 disorder (HCC)    Subjective: "I feel the most positive today"  Stay Summary:   Linda Becker is a 32 year old female who presented here voluntarily yesterday and was accompanied by her sister who is supportive. Patient presented with hx of Bipolar I, PTSD, GAD and MDD.    Per chart review: Patient was encouraged by her sister and a friend to seek help due to her frequent lying about being pregnant for several months. Patient had not been taking any psychotropics for over 3 years. She suffers from liver disease, GI bleeding, anemia, thrombocytopenia and IBS. She is prescribed multiple medications but has not been consistent with her medication regimen. Patient has not been receiving any psychiatric services. On 02/23/2023, patient presented to [facility] reporting that she had had a miscarriage in May but did not inform her husband. Husband accompanied her hoping that she was going to be admitted for delivery. Potential Child abduction was suspected secondary to her statements  and security was alerted.   Assessment: 32 year old female sitting in her bed. She is pleasant and receptive upon approach. Sh is alert and oriented x 4. Denying SI/HI/AVH. She does not appear to be preoccupied.  She is casually dressed and groomed. Her thought process is clear and organized.  Patient continues to believe that she lost her pregnancy in May 2024 and did not want to tell her husband because "because he was alcoholic and was drinking too much". She reports that she had her miscarriage at home and did not tell anyone. Patient reports that she also lost a stillborn son in 03/27/18. She reports that she was encouraged to come here because "I was feeling horrible about the lie, I was full of shame, guilt...". She reports that her sister and best friend "gave me no  other choice but to get some help". Patient reports multiple stressors that contributed to her behaviors: her health has been deteriorating and "I have no hope to have a child for my husband", mother died in 03-27-2020 from liver failure and she had to take care of her through the end, previous miscarriage, etc.  She reports that she lied to her husband "because I was trying to protect him from the pain, I love him with all my heart".  She reports that her husband does not want to live with her anymore and she will be staying with her sister or her father. Patient reports she has been losing weight over the past months secondary to these obsessive thoughts of telling lies all the time.   Patient reports she is willing to work on this problem through therapy. She reports feeling guilty about not telling the truth to her husband and she now is losing her relationship. 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  Provider discussed  safety precautions with patient and her sister and they both verbalized understanding. Patient will be staying with her sister and also has the option to live with her father. I discussed this discharge plan  with Dr Lupita Raider and she agreed to it.    Total Time spent with patient: 45 minutes  Past Psychiatric History: PTSD, BPD, MDD Past Medical History: GERD, Liver disease, GI problems, thrombocytopenia Family History: NA Family Psychiatric History: NA Social  History: Was living with her husband. On disability. She is supported by her sister and father Tobacco Cessation:  N/A, patient does not currently use tobacco products  Current Medications:  Current Facility-Administered Medications  Medication Dose Route Frequency Provider Last Rate Last Admin   acetaminophen (TYLENOL) tablet 650 mg  650 mg Oral Q6H PRN Ardis Hughs, NP       albuterol (VENTOLIN HFA) 108 (90 Base) MCG/ACT inhaler 2 puff  2 puff  Inhalation Q4H PRN Ardis Hughs, NP       alum & mag hydroxide-simeth (MAALOX/MYLANTA) 200-200-20 MG/5ML suspension 30 mL  30 mL Oral Q4H PRN Ardis Hughs, NP       ascorbic acid (VITAMIN C) tablet 2,000 mg  2,000 mg Oral BID Vernard Gambles H, NP   2,000 mg at 02/25/23 2536   dicyclomine (BENTYL) tablet 20 mg  20 mg Oral Q6H PRN Ardis Hughs, NP   20 mg at 02/25/23 0953   famotidine (PEPCID) tablet 40 mg  40 mg Oral BID Ardis Hughs, NP   40 mg at 02/25/23 0954   gabapentin (NEURONTIN) capsule 300 mg  300 mg Oral BID Ardis Hughs, NP   300 mg at 02/25/23 6440   hydrOXYzine (ATARAX) tablet 25 mg  25 mg Oral Q6H PRN Ardis Hughs, NP   25 mg at 02/24/23 2129   loperamide (IMODIUM) capsule 2-4 mg  2-4 mg Oral PRN Ardis Hughs, NP   2 mg at 02/25/23 3474   magnesium hydroxide (MILK OF MAGNESIA) suspension 30 mL  30 mL Oral Daily PRN Ardis Hughs, NP       methocarbamol (ROBAXIN) tablet 500 mg  500 mg Oral Q8H PRN Ardis Hughs, NP       naproxen (NAPROSYN) tablet 500 mg  500 mg Oral BID PRN Ardis Hughs, NP   500 mg at 02/25/23 0953   nitrofurantoin (macrocrystal-monohydrate) (MACROBID) capsule 100 mg  100 mg Oral BID Ardis Hughs, NP   100 mg at 02/25/23 0952   ondansetron (ZOFRAN-ODT) disintegrating tablet 4 mg  4 mg Oral Q6H PRN Ardis Hughs, NP       promethazine (PHENERGAN) tablet 25 mg  25 mg Oral Q6H PRN Ardis Hughs, NP   25 mg at 02/24/23 2131   traZODone (DESYREL) tablet 50 mg  50 mg Oral QHS PRN Ardis Hughs, NP   50 mg at 02/24/23 2129   ursodiol (ACTIGALL) tablet 500 mg  500 mg Oral TID Ardis Hughs, NP       Vonoprazan Fumarate TABS 1 tablet  1 tablet Oral Daily Ardis Hughs, NP       Current Outpatient Medications  Medication Sig Dispense Refill   albuterol (PROAIR HFA) 108 (90 Base) MCG/ACT inhaler Inhale 2 puffs into the lungs every 4-6 hours as needed. 18 g 0   albuterol (PROVENTIL) (2.5  MG/3ML) 0.083% nebulizer solution Inhale 1 ampule by nebulization every 4 (four) hours as needed for wheezing or shortness of breath. 90 mL 1   Ascorbic Acid (VITAMIN C) 1000 MG tablet Take 2,000 mg by mouth 2 (two) times daily.     budesonide (RHINOCORT AQUA) 32 MCG/ACT nasal spray Place 1 spray into both nostrils in the morning and at bedtime. 8.43 mL 2   CALCIUM PO Take 1 tablet by mouth 2 (two) times daily.     Cetirizine HCl 10 MG CAPS Take 10 mg by mouth 2 (two)  times daily.      cyclobenzaprine (FLEXERIL) 10 MG tablet Take 1 tablet (10 mg total) by mouth 3 (three) times daily as needed for muscle spasms 21 tablet 0   diphenhydrAMINE (BENADRYL) 25 MG tablet Take 50 mg by mouth at bedtime as needed for allergies or sleep.     ergocalciferol (VITAMIN D2) 1.25 MG (50000 UT) capsule Take 1 capsule (50,000 Units total) by mouth once a week. 12 capsule 1   famotidine (PEPCID) 40 MG tablet Take 1 tablet (40 mg total) by mouth 2 (two) times daily. 60 tablet 0   fluconazole (DIFLUCAN) 150 MG tablet Take 1 tablet (150 mg total) by mouth every three (3) days as needed. 3 tablet 0   gabapentin (NEURONTIN) 300 MG capsule Take 1 capsule (300 mg) by mouth 2 times daily. 180 capsule 1   nadolol (CORGARD) 40 MG tablet Take 1 tablet (40 mg total) by mouth daily. 90 tablet 1   nitrofurantoin, macrocrystal-monohydrate, (MACROBID) 100 MG capsule Take 1 capsule (100 mg total) by mouth 2 (two) times daily. 10 capsule 0   Prenatal Vit-Fe Fumarate-FA (MULTIVITAMIN-PRENATAL) 27-0.8 MG TABS tablet Take 1 tablet by mouth 2 (two) times daily.     promethazine (PHENERGAN) 25 MG suppository Place 1 suppository (25 mg total) rectally every 6 (six) hours as needed for nausea or vomiting. 30 each 1   promethazine (PHENERGAN) 25 MG tablet Take 1 tablet (25 mg total) by mouth every 6 (six) hours as needed. 120 tablet 1   sucralfate (CARAFATE) 1 GM/10ML suspension Take 10 mLs by mouth 4 times daily -  with meals and at bedtime.  420 mL 1   ursodiol (ACTIGALL) 500 MG tablet Take 1 tablet (500 mg total) by mouth 3 (three) times daily 90 tablet 11   verapamil (CALAN) 40 MG tablet Take 40 mg by mouth daily.     Vonoprazan Fumarate (VOQUEZNA) 20 MG TABS Take 1 tablet by mouth daily. 30 tablet 1   XIFAXAN 550 MG TABS tablet Take 1 tablet (550 mg total) by mouth 2 (two) times daily. 180 tablet 3   Facility-Administered Medications Ordered in Other Encounters  Medication Dose Route Frequency Provider Last Rate Last Admin   0.9 %  sodium chloride infusion   Intravenous Once PRN Pyrtle, Carie Caddy, MD       albuterol (VENTOLIN HFA) 108 (90 Base) MCG/ACT inhaler 2 puff  2 puff Inhalation Once PRN Pyrtle, Carie Caddy, MD       diphenhydrAMINE (BENADRYL) injection 50 mg  50 mg Intravenous Once PRN Pyrtle, Carie Caddy, MD       EPINEPHrine (EPI-PEN) injection 0.3 mg  0.3 mg Intramuscular Once PRN Pyrtle, Carie Caddy, MD       famotidine (PEPCID) IVPB 20 mg premix  20 mg Intravenous Once PRN Pyrtle, Carie Caddy, MD       iron sucrose (VENOFER) 200 mg in sodium chloride 0.9 % 100 mL IVPB  200 mg Intravenous Once Pyrtle, Carie Caddy, MD       methylPREDNISolone sodium succinate (SOLU-MEDROL) 125 mg/2 mL injection 125 mg  125 mg Intravenous Once PRN Pyrtle, Carie Caddy, MD       promethazine (PHENERGAN) suppository 12.5-25 mg  12.5-25 mg Rectal Q4H PRN Berna Bue, MD        PTA Medications:  Facility Ordered Medications  Medication   promethazine (PHENERGAN) suppository 12.5-25 mg   iron sucrose (VENOFER) 200 mg in sodium chloride 0.9 % 100 mL IVPB   famotidine (  PEPCID) IVPB 20 mg premix   0.9 %  sodium chloride infusion   methylPREDNISolone sodium succinate (SOLU-MEDROL) 125 mg/2 mL injection 125 mg   diphenhydrAMINE (BENADRYL) injection 50 mg   albuterol (VENTOLIN HFA) 108 (90 Base) MCG/ACT inhaler 2 puff   EPINEPHrine (EPI-PEN) injection 0.3 mg   acetaminophen (TYLENOL) tablet 650 mg   alum & mag hydroxide-simeth (MAALOX/MYLANTA) 200-200-20 MG/5ML suspension  30 mL   magnesium hydroxide (MILK OF MAGNESIA) suspension 30 mL   traZODone (DESYREL) tablet 50 mg   dicyclomine (BENTYL) tablet 20 mg   hydrOXYzine (ATARAX) tablet 25 mg   loperamide (IMODIUM) capsule 2-4 mg   methocarbamol (ROBAXIN) tablet 500 mg   naproxen (NAPROSYN) tablet 500 mg   ondansetron (ZOFRAN-ODT) disintegrating tablet 4 mg   albuterol (VENTOLIN HFA) 108 (90 Base) MCG/ACT inhaler 2 puff   ascorbic acid (VITAMIN C) tablet 2,000 mg   famotidine (PEPCID) tablet 40 mg   gabapentin (NEURONTIN) capsule 300 mg   promethazine (PHENERGAN) tablet 25 mg   ursodiol (ACTIGALL) tablet 500 mg   Vonoprazan Fumarate TABS 1 tablet   nitrofurantoin (macrocrystal-monohydrate) (MACROBID) capsule 100 mg   PTA Medications  Medication Sig   Cetirizine HCl 10 MG CAPS Take 10 mg by mouth 2 (two) times daily.    Prenatal Vit-Fe Fumarate-FA (MULTIVITAMIN-PRENATAL) 27-0.8 MG TABS tablet Take 1 tablet by mouth 2 (two) times daily.   Ascorbic Acid (VITAMIN C) 1000 MG tablet Take 2,000 mg by mouth 2 (two) times daily.   CALCIUM PO Take 1 tablet by mouth 2 (two) times daily.   diphenhydrAMINE (BENADRYL) 25 MG tablet Take 50 mg by mouth at bedtime as needed for allergies or sleep.   albuterol (PROVENTIL) (2.5 MG/3ML) 0.083% nebulizer solution Inhale 1 ampule by nebulization every 4 (four) hours as needed for wheezing or shortness of breath.   budesonide (RHINOCORT AQUA) 32 MCG/ACT nasal spray Place 1 spray into both nostrils in the morning and at bedtime.   ursodiol (ACTIGALL) 500 MG tablet Take 1 tablet (500 mg total) by mouth 3 (three) times daily   XIFAXAN 550 MG TABS tablet Take 1 tablet (550 mg total) by mouth 2 (two) times daily.   sucralfate (CARAFATE) 1 GM/10ML suspension Take 10 mLs by mouth 4 times daily -  with meals and at bedtime.   nadolol (CORGARD) 40 MG tablet Take 1 tablet (40 mg total) by mouth daily.   cyclobenzaprine (FLEXERIL) 10 MG tablet Take 1 tablet (10 mg total) by mouth 3 (three)  times daily as needed for muscle spasms   ergocalciferol (VITAMIN D2) 1.25 MG (50000 UT) capsule Take 1 capsule (50,000 Units total) by mouth once a week.   famotidine (PEPCID) 40 MG tablet Take 1 tablet (40 mg total) by mouth 2 (two) times daily.   albuterol (PROAIR HFA) 108 (90 Base) MCG/ACT inhaler Inhale 2 puffs into the lungs every 4-6 hours as needed.   Vonoprazan Fumarate (VOQUEZNA) 20 MG TABS Take 1 tablet by mouth daily.   gabapentin (NEURONTIN) 300 MG capsule Take 1 capsule (300 mg) by mouth 2 times daily.   verapamil (CALAN) 40 MG tablet Take 40 mg by mouth daily.   promethazine (PHENERGAN) 25 MG suppository Place 1 suppository (25 mg total) rectally every 6 (six) hours as needed for nausea or vomiting.   promethazine (PHENERGAN) 25 MG tablet Take 1 tablet (25 mg total) by mouth every 6 (six) hours as needed.   nitrofurantoin, macrocrystal-monohydrate, (MACROBID) 100 MG capsule Take 1 capsule (100 mg  total) by mouth 2 (two) times daily.   fluconazole (DIFLUCAN) 150 MG tablet Take 1 tablet (150 mg total) by mouth every three (3) days as needed.       02/25/2023   10:40 AM 04/05/2019    5:20 PM 03/11/2019    9:15 AM  Depression screen PHQ 2/9  Decreased Interest 0 0 0  Down, Depressed, Hopeless 0 0 0  PHQ - 2 Score 0 0 0    Flowsheet Row ED from 02/24/2023 in [facility] Admission (Discharged) from 02/23/2023 in [facility] Ad[facility] (Discharged) from 05/12/2022 in [facility]  C-SSRS RISK CATEGORY No Risk No Risk No Risk       Musculoskeletal  Strength & Muscle Tone: within normal limits Gait & Station: normal Patient leans: N/A  Psychiatric Specialty Exam  Presentation  General Appearance:  Casual  Eye Contact: Fair  Speech: Clear and Coherent  Speech Volume: Normal  Handedness: Right   Mood and Affect  Mood: Depressed  Affect: Congruent   Thought Process  Thought  Processes: Coherent  Descriptions of Associations:Tangential (illusions)  Orientation:Full (Time, Place and Person)  Thought Content:Delusions; Illusions; Obsessions  Diagnosis of Schizophrenia or Schizoaffective disorder in past: No    Hallucinations:Hallucinations: None  Ideas of Reference:None  Suicidal Thoughts:Suicidal Thoughts: No  Homicidal Thoughts:Homicidal Thoughts: No   Sensorium  Memory: Immediate Good; Recent Fair; Remote Fair  Judgment: Fair  Insight: Fair   Chartered certified accountant: Fair  Attention Span: Fair  Recall: [employer] of Knowledge: Fair  Language: Fair   Psychomotor Activity  Psychomotor Activity: Psychomotor Activity: Normal   Assets  Assets: Communication Skills; Desire for Improvement; Housing; Social Support   Sleep  Sleep: Sleep: Fair Number of Hours of Sleep: 7   Nutritional Assessment (For OBS and FBC admissions only) Has the patient had a weight loss or gain of 10 pounds or more in the last 3 months?: Yes Has the patient had a decrease in food intake/or appetite?: Yes Does the patient have dental problems?: No Does the patient have eating habits or behaviors that may be indicators of an eating disorder including binging or inducing vomiting?: No Has the patient recently lost weight without trying?: 1 Has the patient been eating poorly because of a decreased appetite?: 1 Malnutrition Screening Tool Score: 2 Nutritional Assessment Referrals: Refer to Primary Care Provider    Physical Exam  Physical Exam Vitals and nursing note reviewed.  Constitutional:      Appearance: Normal appearance.  HENT:     Head: Normocephalic and atraumatic.     Right Ear: Tympanic membrane normal.     Left Ear: Tympanic membrane normal.     Nose: Nose normal.  Eyes:     Extraocular Movements: Extraocular movements intact.     Pupils: Pupils are equal, round, and reactive to light.  Cardiovascular:     Rate and  Rhythm: Normal rate.     Pulses: Normal pulses.  Musculoskeletal:        General: Normal range of motion.     Cervical back: Normal range of motion and neck supple.  Neurological:     General: No focal deficit present.     Mental Status: She is oriented to person, place, and time.    Review of Systems  Constitutional: Negative.   HENT: Negative.    Eyes: Negative.   Respiratory: Negative.    Cardiovascular: Negative.   Gastrointestinal: Negative.  Genitourinary: Negative.   Musculoskeletal: Negative.   Skin: Negative.   Neurological: Negative.   Endo/Heme/Allergies: Negative.   Psychiatric/Behavioral:  Positive for depression. The patient is nervous/anxious.    Blood pressure 124/77, pulse 97, temperature 98.3 F (36.8 C), temperature source Oral, resp. rate 18, SpO2 100%. There is no height or weight on file to calculate BMI.  Demographic Factors:  Caucasian, Low socioeconomic status, and Unemployed  Loss Factors: Loss of significant relationship and Decline in physical health  Historical Factors: NA  Risk Reduction Factors:   Living with another person, especially a relative and Positive social support  Continued Clinical Symptoms:  Bipolar Disorder:   Bipolar II Depression:   Impulsivity Personality Disorders:   Comorbid depression  Cognitive Features That Contribute To Risk:  None    Suicide Risk:  Minimal: No identifiable suicidal ideation.  Patients presenting with no risk factors but with morbid ruminations; may be classified as minimal risk based on the severity of the depressive symptoms  Plan Of Care/Follow-up recommendations:  Activity:  As tolerated  Disposition: Discharged to sister's house.   Olin Pia, NP 02/25/2023, 10:41 AM

## 2023-02-26 ENCOUNTER — Other Ambulatory Visit (HOSPITAL_COMMUNITY): Payer: Self-pay

## 2023-02-26 ENCOUNTER — Encounter (HOSPITAL_COMMUNITY): Payer: Self-pay | Admitting: Physician Assistant

## 2023-02-26 ENCOUNTER — Other Ambulatory Visit: Payer: Self-pay | Admitting: Allergy and Immunology

## 2023-02-26 ENCOUNTER — Encounter: Payer: Self-pay | Admitting: Hematology and Oncology

## 2023-02-26 ENCOUNTER — Ambulatory Visit: Payer: Medicaid Other | Admitting: Cardiology

## 2023-02-26 ENCOUNTER — Ambulatory Visit (INDEPENDENT_AMBULATORY_CARE_PROVIDER_SITE_OTHER): Payer: Medicaid Other | Admitting: Physician Assistant

## 2023-02-26 VITALS — BP 126/69 | HR 88 | Temp 98.3°F | Ht 65.0 in | Wt 198.2 lb

## 2023-02-26 DIAGNOSIS — F411 Generalized anxiety disorder: Secondary | ICD-10-CM | POA: Diagnosis not present

## 2023-02-26 DIAGNOSIS — F3181 Bipolar II disorder: Secondary | ICD-10-CM | POA: Diagnosis not present

## 2023-02-26 DIAGNOSIS — F4310 Post-traumatic stress disorder, unspecified: Secondary | ICD-10-CM

## 2023-02-26 LAB — HEMOGLOBIN A1C
Hgb A1c MFr Bld: 4.4 % — ABNORMAL LOW (ref 4.8–5.6)
Mean Plasma Glucose: 80 mg/dL

## 2023-02-26 MED ORDER — GABAPENTIN 400 MG PO CAPS
400.0000 mg | ORAL_CAPSULE | Freq: Two times a day (BID) | ORAL | 1 refills | Status: DC
Start: 2023-02-26 — End: 2023-03-20
  Filled 2023-02-26: qty 60, 30d supply, fill #0
  Filled 2023-03-16: qty 60, 30d supply, fill #1

## 2023-02-26 MED ORDER — ESCITALOPRAM OXALATE 10 MG PO TABS
10.0000 mg | ORAL_TABLET | Freq: Every day | ORAL | 1 refills | Status: DC
Start: 2023-02-26 — End: 2023-04-14
  Filled 2023-02-26 – 2023-04-06 (×3): qty 30, 30d supply, fill #0

## 2023-02-26 MED ORDER — CARIPRAZINE HCL 1.5 MG PO CAPS
1.5000 mg | ORAL_CAPSULE | Freq: Every day | ORAL | 1 refills | Status: DC
Start: 2023-02-26 — End: 2023-04-14
  Filled 2023-02-26 – 2023-04-06 (×3): qty 30, 30d supply, fill #0

## 2023-02-26 NOTE — Progress Notes (Signed)
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 capsule (400 mg total) by mouth 2 (two) times daily.  Dispense: 60 capsule; Refill: 1  3. PTSD (post-traumatic stress disorder)  Patient to follow up in 6 weeks Provider spent a total of 54 minutes with the patient/reviewing patient's chart  Meta Hatchet, PA 9/12/20245:25 PM

## 2023-02-27 ENCOUNTER — Other Ambulatory Visit: Payer: Self-pay

## 2023-02-27 ENCOUNTER — Other Ambulatory Visit (HOSPITAL_COMMUNITY): Payer: Self-pay

## 2023-02-27 DIAGNOSIS — D696 Thrombocytopenia, unspecified: Secondary | ICD-10-CM

## 2023-02-27 DIAGNOSIS — D509 Iron deficiency anemia, unspecified: Secondary | ICD-10-CM

## 2023-03-02 ENCOUNTER — Other Ambulatory Visit: Payer: Self-pay | Admitting: Hematology and Oncology

## 2023-03-02 ENCOUNTER — Telehealth: Payer: Self-pay

## 2023-03-02 ENCOUNTER — Inpatient Hospital Stay: Payer: Medicaid Other | Attending: Hematology and Oncology

## 2023-03-02 DIAGNOSIS — D696 Thrombocytopenia, unspecified: Secondary | ICD-10-CM

## 2023-03-02 DIAGNOSIS — D509 Iron deficiency anemia, unspecified: Secondary | ICD-10-CM | POA: Diagnosis present

## 2023-03-02 LAB — CBC WITH DIFFERENTIAL (CANCER CENTER ONLY)
Abs Immature Granulocytes: 0.01 10*3/uL (ref 0.00–0.07)
Basophils Absolute: 0 10*3/uL (ref 0.0–0.1)
Basophils Relative: 1 %
Eosinophils Absolute: 0.3 10*3/uL (ref 0.0–0.5)
Eosinophils Relative: 10 %
HCT: 33.1 % — ABNORMAL LOW (ref 36.0–46.0)
Hemoglobin: 10.4 g/dL — ABNORMAL LOW (ref 12.0–15.0)
Immature Granulocytes: 0 %
Lymphocytes Relative: 29 %
Lymphs Abs: 0.8 10*3/uL (ref 0.7–4.0)
MCH: 29.2 pg (ref 26.0–34.0)
MCHC: 31.4 g/dL (ref 30.0–36.0)
MCV: 93 fL (ref 80.0–100.0)
Monocytes Absolute: 0.2 10*3/uL (ref 0.1–1.0)
Monocytes Relative: 7 %
Neutro Abs: 1.5 10*3/uL — ABNORMAL LOW (ref 1.7–7.7)
Neutrophils Relative %: 53 %
Platelet Count: 68 10*3/uL — ABNORMAL LOW (ref 150–400)
RBC: 3.56 MIL/uL — ABNORMAL LOW (ref 3.87–5.11)
RDW: 17.6 % — ABNORMAL HIGH (ref 11.5–15.5)
WBC Count: 2.7 10*3/uL — ABNORMAL LOW (ref 4.0–10.5)
nRBC: 0 % (ref 0.0–0.2)

## 2023-03-02 LAB — IRON AND IRON BINDING CAPACITY (CC-WL,HP ONLY)
Iron: 26 ug/dL — ABNORMAL LOW (ref 28–170)
Saturation Ratios: 7 % — ABNORMAL LOW (ref 10.4–31.8)
TIBC: 375 ug/dL (ref 250–450)
UIBC: 349 ug/dL (ref 148–442)

## 2023-03-02 LAB — RETIC PANEL
Immature Retic Fract: 9.9 % (ref 2.3–15.9)
RBC.: 3.45 MIL/uL — ABNORMAL LOW (ref 3.87–5.11)
Retic Count, Absolute: 39.5 10*3/uL (ref 19.0–186.0)
Retic Ct Pct: 1.2 % (ref 0.4–3.1)
Reticulocyte Hemoglobin: 25.3 pg — ABNORMAL LOW (ref >27.9)

## 2023-03-02 LAB — SAMPLE TO BLOOD BANK

## 2023-03-02 LAB — FERRITIN: Ferritin: 8 ng/mL — ABNORMAL LOW (ref 11–307)

## 2023-03-02 LAB — VITAMIN B12: Vitamin B-12: 432 pg/mL (ref 180–914)

## 2023-03-02 NOTE — Telephone Encounter (Signed)
CRITICAL VALUE STICKER  CRITICAL VALUE: Hgb. 10.3  RECEIVER (on-site recipient of call): Sharlette Dense CMA  DATE & TIME NOTIFIED: 03/02/2023 1040  MESSENGER (representative from lab): Pam in Lab  MD NOTIFIED: Dr. Pamelia Hoit  TIME OF NOTIFICATION: 1041  RESPONSE: Made nurse aware they have a hold for blood bank

## 2023-03-03 ENCOUNTER — Other Ambulatory Visit: Payer: Self-pay | Admitting: Allergy and Immunology

## 2023-03-03 ENCOUNTER — Other Ambulatory Visit: Payer: Self-pay | Admitting: Internal Medicine

## 2023-03-03 MED ORDER — VOQUEZNA 20 MG PO TABS
1.0000 | ORAL_TABLET | Freq: Every day | ORAL | 1 refills | Status: DC
  Filled 2023-03-03 – 2023-03-16 (×2): qty 30, 30d supply, fill #0
  Filled 2023-04-22: qty 30, 30d supply, fill #1

## 2023-03-04 ENCOUNTER — Ambulatory Visit (HOSPITAL_COMMUNITY): Payer: Medicaid Other | Admitting: Professional

## 2023-03-04 ENCOUNTER — Telehealth (HOSPITAL_COMMUNITY): Payer: Self-pay | Admitting: Professional

## 2023-03-04 ENCOUNTER — Encounter (HOSPITAL_COMMUNITY): Payer: Self-pay

## 2023-03-04 ENCOUNTER — Other Ambulatory Visit (HOSPITAL_COMMUNITY): Payer: Self-pay

## 2023-03-04 DIAGNOSIS — F3181 Bipolar II disorder: Secondary | ICD-10-CM

## 2023-03-04 DIAGNOSIS — F411 Generalized anxiety disorder: Secondary | ICD-10-CM

## 2023-03-04 DIAGNOSIS — D509 Iron deficiency anemia, unspecified: Secondary | ICD-10-CM | POA: Diagnosis not present

## 2023-03-04 DIAGNOSIS — F4310 Post-traumatic stress disorder, unspecified: Secondary | ICD-10-CM
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
## 2023-03-05 ENCOUNTER — Other Ambulatory Visit: Payer: Self-pay

## 2023-03-06 ENCOUNTER — Telehealth: Payer: Self-pay | Admitting: Hematology and Oncology

## 2023-03-06 ENCOUNTER — Inpatient Hospital Stay (HOSPITAL_BASED_OUTPATIENT_CLINIC_OR_DEPARTMENT_OTHER): Payer: Medicaid Other | Admitting: Hematology and Oncology

## 2023-03-06 DIAGNOSIS — D696 Thrombocytopenia, unspecified: Secondary | ICD-10-CM | POA: Diagnosis not present

## 2023-03-06 NOTE — Assessment & Plan Note (Signed)
Due to cirrhosis of the liver and splenomegaly   Lab review: 02/04/2018: Platelets 135 07/27/2018: Platelet count 102 01/28/2020: Platelet count 53 02/07/2020: Platelet count 63 05/13/2022: Platelets 87 01/06/2023: Hemoglobin 7, MCV 77.4, platelets 71, WBC 2.6, iron saturation 2.8%, TIBC 459, ferritin 2.9 01/19/2023: Hemoglobin 7.4, MCV 86, WBC 2.1, platelets 71 (patient received 4 doses of IV iron so far) 03/02/2023: Hemoglobin 10.4, platelets 68, WBC 2.7, reticulocytes 1.2%, iron saturation 7%, B12 432, ferritin 8   Previous work-up was negative hepatitis B and C.  Hepatitis A showed seropositivity   Treatment plan: Recommended IV iron once again Prior gastric bypass surgery   Return to clinic in 2 months to recheck labs

## 2023-03-06 NOTE — Telephone Encounter (Signed)
Patient is aware of scheduled appointment times/dates

## 2023-03-06 NOTE — Progress Notes (Signed)
HEMATOLOGY-ONCOLOGY TELEPHONE VISIT PROGRESS NOTE  I connected with our patient on 03/06/23 at  9:30 AM EDT by telephone and verified that I am speaking with the correct person using two identifiers.  I discussed the limitations, risks, security and privacy concerns of performing an evaluation and management service by telephone and the availability of in person appointments.  I also discussed with the patient that there may be a patient responsible charge related to this service. The patient expressed understanding and agreed to proceed.   History of Present Illness:  Discussed the use of AI scribe software for clinical note transcription with the patient, who gave verbal consent to proceed.  History of Present Illness   Linda Becker, a patient with a history of fluctuating blood counts and chronic anemia, presents for a follow-up visit. She reports that her blood counts have been fluctuating quite a bit on a week to week, month to month basis. She notes that her platelet count shot up to a hundred ten days ago and then dropped to sixty eight six days later. She has experienced low platelet counts in the past, which prompted her previous doctor to start iron transfusions. Initially, she needed these transfusions every six months, but the frequency increased to every three months when her hemoglobin levels dropped. She also reports chronic tiredness and mentions that she can function normally even when her hemoglobin levels are as low as nine. She is scheduled for an endoscopy and colonoscopy next month due to concerns about potential bleeding.       REVIEW OF SYSTEMS:   Constitutional: Denies fevers, chills or abnormal weight loss All other systems were reviewed with the patient and are negative. Observations/Objective:     Assessment Plan:  Thrombocytopenia (HCC) Due to cirrhosis of the liver and splenomegaly   Lab review: 02/04/2018: Platelets 135 07/27/2018: Platelet count 102 01/28/2020:  Platelet count 53 02/07/2020: Platelet count 63 05/13/2022: Platelets 87 01/06/2023: Hemoglobin 7, MCV 77.4, platelets 71, WBC 2.6, iron saturation 2.8%, TIBC 459, ferritin 2.9 01/19/2023: Hemoglobin 7.4, MCV 86, WBC 2.1, platelets 71 (patient received 4 doses of IV iron so far) 03/02/2023: Hemoglobin 10.4, platelets 68, WBC 2.7, reticulocytes 1.2%, iron saturation 7%, B12 432, ferritin 8   Previous work-up was negative hepatitis B and C.  Hepatitis A showed seropositivity   Treatment plan: Recommended IV iron once again (will request Monoferric because she is a very hard IV stick) Prior gastric bypass surgery   --------------------------------- Assessment and Plan    Iron Deficiency Anemia Chronic anemia with low iron saturation (7%) and ferritin (8%). Previous iron infusions with Venofer required frequently (every 3 months). Discussed the possibility of using different iron products (Feraheme or Monoferric) to potentially reduce the frequency of infusions. -Attempt to get approval for Monoferric. If not approved, use Feraheme. -Plan for iron infusion at the cancer center once approval is obtained. -Recheck labs in 3 months after the iron infusion.  Thrombocytopenia Fluctuating platelet counts with recent increase to 110 then decrease to 68. No clear cause identified. -Continue to monitor platelet counts.  Potential Bone Marrow Biopsy Discussed the possibility of a bone marrow biopsy due to low white blood cell count (2.7) and ANC (1.5). However, these counts have been relatively stable. -Defer bone marrow biopsy at this time. Continue to monitor blood counts.  Colonoscopy Scheduled for 03/24/2023. Discussed the possibility of occult bleeding as a cause for anemia. -Proceed with scheduled colonoscopy.          I discussed the assessment  and treatment plan with the patient. The patient was provided an opportunity to ask questions and all were answered. The patient agreed with the plan  and demonstrated an understanding of the instructions. The patient was advised to call back or seek an in-person evaluation if the symptoms worsen or if the condition fails to improve as anticipated.   I provided 12 minutes of non-face-to-face time during this encounter.  This includes time for charting and coordination of care   Tamsen Meek, MD

## 2023-03-07 ENCOUNTER — Inpatient Hospital Stay (HOSPITAL_COMMUNITY): Admit: 2023-03-07 | Payer: Medicaid Other

## 2023-03-09 ENCOUNTER — Encounter: Payer: Self-pay | Admitting: Hematology and Oncology

## 2023-03-09 ENCOUNTER — Other Ambulatory Visit: Payer: Self-pay | Admitting: Internal Medicine

## 2023-03-09 ENCOUNTER — Encounter: Payer: Self-pay | Admitting: Internal Medicine

## 2023-03-09 ENCOUNTER — Other Ambulatory Visit (HOSPITAL_BASED_OUTPATIENT_CLINIC_OR_DEPARTMENT_OTHER): Payer: Self-pay

## 2023-03-09 ENCOUNTER — Other Ambulatory Visit (HOSPITAL_COMMUNITY): Payer: Self-pay

## 2023-03-09 MED ORDER — PROMETHAZINE HCL 25 MG PO TABS
25.0000 mg | ORAL_TABLET | Freq: Four times a day (QID) | ORAL | 1 refills | Status: DC | PRN
  Filled 2023-03-09 – 2023-03-16 (×4): qty 120, 30d supply, fill #0
  Filled 2023-04-06 – 2023-04-07 (×2): qty 120, 30d supply, fill #1

## 2023-03-10 ENCOUNTER — Other Ambulatory Visit (HOSPITAL_COMMUNITY): Payer: Self-pay

## 2023-03-10 ENCOUNTER — Encounter (HOSPITAL_COMMUNITY): Payer: Self-pay | Admitting: Licensed Clinical Social Worker

## 2023-03-10 ENCOUNTER — Other Ambulatory Visit: Payer: Self-pay

## 2023-03-10 ENCOUNTER — Ambulatory Visit (HOSPITAL_COMMUNITY): Payer: Medicaid Other | Admitting: Licensed Clinical Social Worker

## 2023-03-10 DIAGNOSIS — F411 Generalized anxiety disorder: Secondary | ICD-10-CM | POA: Diagnosis not present

## 2023-03-10 DIAGNOSIS — F3181 Bipolar II disorder: Secondary | ICD-10-CM | POA: Diagnosis not present

## 2023-03-10 NOTE — Progress Notes (Signed)
Virtual Visit via Video Note  I connected with [name] on 03/10/23 at  9:00 AM EDT by a video enabled telemedicine application and verified that I am speaking with the correct person using two identifiers.  Location: Patient: Home  Provider: Office    I discussed the limitations of evaluation and management by telemedicine and the availability of in person appointments. The patient expressed understanding and agreed to proceed.  I discussed the assessment and treatment plan with the patient. The patient was provided an opportunity to ask questions and all were answered. The patient agreed with the plan and demonstrated an understanding of the instructions.   The patient was advised to call back or seek an in-person evaluation if the symptoms worsen or if the condition fails to improve as anticipated.  I provided 15 minutes of non-face-to-face time during this encounter.   Oneta Rack, NP    Psychiatric Initial Adult Assessment   Patient Identification: Linda Becker MRN:  [id number] Date of Evaluation:  03/10/2023 Referral Source: [facility]  Chief Complaint:  Depression and Anxiety  Visit Diagnosis:    ICD-10-CM   1. Bipolar 2 disorder (HCC)  F31.81     2. Generalized anxiety disorder  F41.1       History of Present Illness:  [place] 32 year old Caucasian female that presents to Partial Hospitalization after referral from [facility]. [place] reported that she recently going through a separation due to lying to her has husband about a miscarriage.  It was reported that she has a stillbirth in the past.  States she is not sure why she actually lied to her husband.  States she is currently residing with her father and the family home.  She does report struggling with grief and loss due to the passing of her stillbirth and her mother.  Patient recently establish care with [facility] urgent care from medication  management where she was a started on Vraylar, gabapentin, Lexapro and trazodone.  As noted in previous assessment: "Per chart review: Patient was encouraged by her sister and a friend to seek help due to her frequent lying about being pregnant for several months. Patient had not been taking any psychotropics for over 3 years. She suffers from liver disease, GI bleeding, anemia, thrombocytopenia and IBS. She is prescribed multiple medications but has not been consistent with her medication regimen. Patient has not been receiving any psychiatric services. On 02/23/2023, patient presented to [facility] reporting that she had had a miscarriage in May but did not inform her husband. Husband accompanied her hoping that she was going to be admitted for delivery. Potential Child abduction was suspected secondary to her statements  and security was alerted."    [name] is sitting; she is alert/oriented x 4; calm/cooperative; and mood congruent with affect.  Patient is speaking in a clear tone at moderate volume, and normal pace; with good eye contact. Her thought process is coherent and relevant; There is no indication that she is currently responding to internal/external stimuli or experiencing delusional thought content.  Patient denies suicidal/self-harm/homicidal ideation, psychosis, and paranoia.  Patient has remained calm throughout assessment and has answered questions appropriately. Patient to start Partial Hospitalization on 03/10/2023  Associated Signs/Symptoms: Depression Symptoms:  depressed mood, insomnia, difficulty concentrating, anxiety, (Hypo) Manic Symptoms:  Distractibility, Irritable Mood, Anxiety Symptoms:  Excessive Worry, Psychotic Symptoms:  Hallucinations: None PTSD Symptoms: Had a traumatic exposure:  miscarriage   Past Psychiatric History: MDD, PTSD  and Bipolar 2  Previous Psychotropic Medications: Yes   Substance Abuse History in the last 12 months:  No.  Consequences of  Substance Abuse: NA  Past Medical History:  Past Medical History:  Diagnosis Date   Allergic rhinitis    remote hx of allergy shots   Asthma    since childhood  on controller meds extrinsic dr Sharyn Lull   Bipolar II disorder (HCC)    Cirrhosis (HCC)    Erosive esophagitis    Esophageal varices (HCC)    Family history of adverse reaction to anesthesia    mom and sister difficult to awaken per patient    Gallbladder sludge    Gastropathy    GERD (gastroesophageal reflux disease)    on nexium  for  long term sx since childhood   H/O miscarriage, not currently pregnant    [redacted] weeks  march 2016   Hepatic steatosis    Hiatal hernia    Hypothyroidism    Migraine    Murmur    pt reports MVP   Portal hypertension (HCC)    Sessile colonic polyp    Splenomegaly    Steatohepatitis    Syncope    under eval ? cause   Tachycardia    episodes with near syncope eval dr Nadara Eaton  on no meds dced LABA   Thrombocytopenia (HCC)    Upper GI bleed     Past Surgical History:  Procedure Laterality Date   BIOPSY  09/03/2018   Procedure: BIOPSY;  Surgeon: Beverley Fiedler, MD;  Location: [facility];  Service: Gastroenterology;;   BIOPSY  09/26/2020   Procedure: BIOPSY;  Surgeon: Beverley Fiedler, MD;  Location: [facility];  Service: Gastroenterology;;   BIOPSY  05/12/2022   Procedure: BIOPSY;  Surgeon: Beverley Fiedler, MD;  Location: [facility];  Service: Gastroenterology;;   broken right femur  2008   rod placed   COLONOSCOPY WITH PROPOFOL N/A 09/03/2018   Procedure: COLONOSCOPY WITH PROPOFOL;  Surgeon: Beverley Fiedler, MD;  Location: [facility];  Service: Gastroenterology;  Laterality: N/A;   DILATION AND CURETTAGE OF UTERUS     ESOPHAGOGASTRODUODENOSCOPY (EGD) WITH PROPOFOL N/A 09/03/2018   Procedure: ESOPHAGOGASTRODUODENOSCOPY (EGD) WITH PROPOFOL;  Surgeon: Beverley Fiedler, MD;  Location: [facility];  Service: Gastroenterology;  Laterality: N/A;   ESOPHAGOGASTRODUODENOSCOPY (EGD) WITH PROPOFOL N/A  01/27/2020   Procedure: ESOPHAGOGASTRODUODENOSCOPY (EGD) WITH PROPOFOL;  Surgeon: Meridee Score Netty Starring., MD;  Location: [facility];  Service: Gastroenterology;  Laterality: N/A;   ESOPHAGOGASTRODUODENOSCOPY (EGD) WITH PROPOFOL N/A 03/12/2020   Procedure: ESOPHAGOGASTRODUODENOSCOPY (EGD) WITH PROPOFOL;  Surgeon: Beverley Fiedler, MD;  [facility]: [facility];  Service: Gastroenterology;  Laterality: N/A;   ESOPHAGOGASTRODUODENOSCOPY (EGD) WITH PROPOFOL N/A 09/26/2020   Procedure: ESOPHAGOGASTRODUODENOSCOPY (EGD) WITH PROPOFOL;  Surgeon: Beverley Fiedler, MD;  [facility]: [facility];  Service: Gastroenterology;  Laterality: N/A;   ESOPHAGOGASTRODUODENOSCOPY (EGD) WITH PROPOFOL N/A 04/29/2021   Procedure: ESOPHAGOGASTRODUODENOSCOPY (EGD) WITH PROPOFOL;  Surgeon: Beverley Fiedler, MD;  Location: [facility];  Service: Gastroenterology;  Laterality: N/A;   ESOPHAGOGASTRODUODENOSCOPY (EGD) WITH PROPOFOL N/A 05/12/2022   Procedure: ESOPHAGOGASTRODUODENOSCOPY (EGD) WITH PROPOFOL;  Surgeon: Beverley Fiedler, MD;  Location: [facility];  Service: Gastroenterology;  Laterality: N/A;   GASTRIC VARICES BANDING  01/27/2020   Procedure: GASTRIC VARICES BANDING;  Surgeon: Meridee Score Netty Starring., MD;  Location: [facility];  Service: Gastroenterology;;   HOT HEMOSTASIS N/A 04/29/2021   Procedure: HOT HEMOSTASIS (ARGON PLASMA COAGULATION/BICAP);  Surgeon: Beverley Fiedler, MD;  Location: [facility];  Service: Gastroenterology;  Laterality: N/A;   IR TRANSCATHETER BX  01/19/2019   IR US GUIDE VASC ACCESS RIGHT  01/19/2019   IR VENOGRAM HEPATIC W HEMODYNAMIC EVALUATION  01/19/2019   LAPAROSCOPIC GASTRIC SLEEVE RESECTION N/A 06/28/2019   Procedure: LAPAROSCOPIC GASTRIC SLEEVE RESECTION, Upper Endo, ERAS Pathway;  Surgeon: Berna Bue, MD;  Location: [facility];  Service: General;  Laterality: N/A;   OB ultrasound N/A 12/01/2017   see report   POLYPECTOMY  09/03/2018   Procedure: POLYPECTOMY;  Surgeon: Beverley Fiedler, MD;  Location:  [facility];  Service: Gastroenterology;;   TONSILLECTOMY  04/10/05   WISDOM TOOTH EXTRACTION      Family Psychiatric History:   Family History:  Family History  Problem Relation Age of Onset   Liver disease Mother 28       advanced non-alcoholic cirrhosis   Arthritis Mother    Hyperlipidemia Mother    Heart disease Mother    Hypertension Mother    Diabetes Mother    Kidney disease Mother    Bleeding Disorder Mother    Colon polyps Mother    Asthma Father    Arthritis Father    Hyperlipidemia Father    Heart disease Father    Hypertension Father    Diabetes Father    Colon polyps Father    Breast cancer Maternal Aunt    Breast cancer Paternal Aunt    Esophageal cancer Paternal Aunt    Arthritis Maternal Grandmother    Breast cancer Maternal Grandmother    Thyroid disease Maternal Grandmother    Diabetes Maternal Grandfather    Pancreatic cancer Maternal Grandfather    Breast cancer Paternal Grandmother    Diabetes Paternal Grandmother    Colon cancer Paternal Grandfather    Diabetes Paternal Grandfather    Liver cancer Paternal Grandfather    Stomach cancer Paternal Grandfather     Social History:   Social History   Socioeconomic History   Marital status: Married    Spouse name: Not on file   Number of children: 1   Years of education: Not on file   Highest education level: Not on file  Occupational History   Not on file  Tobacco Use   Smoking status: Never   Smokeless tobacco: Never  Vaping Use   Vaping status: Never Used  Substance and Sexual Activity   Alcohol use: No    Alcohol/week: 0.0 standard drinks of alcohol   Drug use: No   Sexual activity: Yes    Birth control/protection: None  Other Topics Concern   Not on file  Social History Narrative   6-7 hours of sleep per night   Works full time   Lives with her parents sis and  Infant nephew   [employer]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   Divorced   8 week preg loss 2022/09/26      Son passed away 04/10/2018   Social Determinants of Health   Financial Resource Strain: Not on file  Food Insecurity: Food Insecurity Present (02/24/2023)   Hunger Vital Sign    Worried About Radiation protection practitioner of Food in the Last Year: Often true    Ran Out of Food in the Last Year: Often true  Transportation Needs: No Transportation Needs (02/24/2023)   PRAPARE - Administrator, Civil Service (Medical): No    Lack of Transportation (Non-Medical): No  Physical Activity: Not on file  Stress:  Not on file  Social Connections: Not on file    Additional Social History:   Allergies:   Allergies  Allergen Reactions   Progesterone Rash and Other (See Comments)    Was in a form of birth control   Penicillins Rash and Other (See Comments)    Did it involve swelling of the face/tongue/throat, SOB, or low BP? No Did it involve sudden or severe rash/hives, skin peeling, or any reaction on the inside of your mouth or nose? Yes Did you need to seek medical attention at a hospital or doctor's office? No When did it last happen?      9 + months If all above answers are "NO", may proceed with cephalosporin use.      Metabolic Disorder Labs: Lab Results  Component Value Date   HGBA1C 4.4 (L) 02/24/2023   MPG 80 02/24/2023   No results found for: "PROLACTIN" Lab Results  Component Value Date   CHOL 221 (H) 02/24/2023   TRIG 103 02/24/2023   HDL 49 02/24/2023   CHOLHDL 4.5 02/24/2023   VLDL 21 02/24/2023   LDLCALC 151 (H) 02/24/2023   LDLCALC 122 (H) 03/11/2019   Lab Results  Component Value Date   TSH 0.809 02/24/2023    Therapeutic Level Labs: No results found for: "LITHIUM" No results found for: "CBMZ" No results found for: "VALPROATE"  Current Medications: Current Outpatient Medications  Medication Sig Dispense Refill   albuterol (PROAIR HFA) 108 (90 Base) MCG/ACT inhaler Inhale 2 puffs into the lungs every 4-6 hours as needed. (Patient taking differently: Inhale 2  puffs into the lungs every 4 (four) hours as needed for wheezing or shortness of breath.) 18 g 0   albuterol (PROVENTIL) (2.5 MG/3ML) 0.083% nebulizer solution Inhale 1 ampule by nebulization every 4 (four) hours as needed for wheezing or shortness of breath. 90 mL 1   Ascorbic Acid (VITAMIN C) 1000 MG tablet Take 2,000 mg by mouth 2 (two) times daily.     budesonide (RHINOCORT AQUA) 32 MCG/ACT nasal spray Place 1 spray into both nostrils in the morning and at bedtime. (Patient taking differently: Place 1 spray into both nostrils 2 (two) times daily as needed for rhinitis or allergies.) 8.43 mL 2   CALCIUM PO Take 1 tablet by mouth 2 (two) times daily.     cariprazine (VRAYLAR) 1.5 MG capsule Take 1 capsule (1.5 mg total) by mouth daily. 30 capsule 1   Cetirizine HCl 10 MG CAPS Take 10 mg by mouth daily.     diphenhydrAMINE (BENADRYL) 25 MG tablet Take 50 mg by mouth at bedtime as needed for allergies or sleep.     ergocalciferol (VITAMIN D2) 1.25 MG (50000 UT) capsule Take 1 capsule (50,000 Units total) by mouth once a week. (Patient taking differently: Take 50,000 Units by mouth every Monday.) 12 capsule 1   escitalopram (LEXAPRO) 10 MG tablet Take 1 tablet (10 mg total) by mouth daily. 30 tablet 1   famotidine (PEPCID) 40 MG tablet Take 1 tablet (40 mg total) by mouth 2 (two) times daily. 60 tablet 0   fluconazole (DIFLUCAN) 150 MG tablet Take 1 tablet (150 mg total) by mouth every three (3) days as needed. (Patient taking differently: Take 150 mg by mouth every three (3) days as needed (For yeast infection).) 3 tablet 0   gabapentin (NEURONTIN) 400 MG capsule Take 1 capsule (400 mg total) by mouth 2 (two) times daily. 60 capsule 1   nadolol (CORGARD) 40 MG tablet Take  1 tablet (40 mg total) by mouth daily. 90 tablet 1   naphazoline-pheniramine (EYE ALLERGY RELIEF) 0.025-0.3 % ophthalmic solution Place 2 drops into both eyes 4 (four) times daily as needed for allergies.     nitrofurantoin,  macrocrystal-monohydrate, (MACROBID) 100 MG capsule Take 1 capsule (100 mg total) by mouth 2 (two) times daily. (Patient taking differently: Take 100 mg by mouth 2 (two) times daily. Take for 5 days starting on 02/21/23.) 10 capsule 0   Prenatal Vit-Fe Fumarate-FA (MULTIVITAMIN-PRENATAL) 27-0.8 MG TABS tablet Take 1 tablet by mouth 2 (two) times daily.     promethazine (PHENERGAN) 25 MG suppository Place 1 suppository (25 mg total) rectally every 6 (six) hours as needed for nausea or vomiting. 30 each 1   promethazine (PHENERGAN) 25 MG tablet Take 1 tablet (25 mg total) by mouth every 6 (six) hours as needed. 120 tablet 1   TURMERIC PO Take 1 capsule by mouth in the morning and at bedtime.     ursodiol (ACTIGALL) 500 MG tablet Take 1 tablet (500 mg total) by mouth 3 (three) times daily (Patient taking differently: Take 500 mg by mouth 3 (three) times daily as needed (For liver disorder).) 90 tablet 11   Vonoprazan Fumarate (VOQUEZNA) 20 MG TABS Take 1 tablet by mouth daily. 30 tablet 1   XIFAXAN 550 MG TABS tablet Take 1 tablet (550 mg total) by mouth 2 (two) times daily. 180 tablet 3   No current facility-administered medications for this visit.   Facility-Administered Medications Ordered in Other Visits  Medication Dose Route Frequency Provider Last Rate Last Admin   0.9 %  sodium chloride infusion   Intravenous Once PRN Pyrtle, Carie Caddy, MD       albuterol (VENTOLIN HFA) 108 (90 Base) MCG/ACT inhaler 2 puff  2 puff Inhalation Once PRN Pyrtle, Carie Caddy, MD       diphenhydrAMINE (BENADRYL) injection 50 mg  50 mg Intravenous Once PRN Pyrtle, Carie Caddy, MD       EPINEPHrine (EPI-PEN) injection 0.3 mg  0.3 mg Intramuscular Once PRN Pyrtle, Carie Caddy, MD       famotidine (PEPCID) IVPB 20 mg premix  20 mg Intravenous Once PRN Pyrtle, Carie Caddy, MD       iron sucrose (VENOFER) 200 mg in sodium chloride 0.9 % 100 mL IVPB  200 mg Intravenous Once Pyrtle, Carie Caddy, MD       methylPREDNISolone sodium succinate (SOLU-MEDROL) 125  mg/2 mL injection 125 mg  125 mg Intravenous Once PRN Pyrtle, Carie Caddy, MD       promethazine (PHENERGAN) suppository 12.5-25 mg  12.5-25 mg Rectal Q4H PRN Berna Bue, MD        Musculoskeletal: Strength & Muscle Tone: within normal limits Gait & Station: normal Patient leans: N/A  Psychiatric Specialty Exam: Review of Systems  Psychiatric/Behavioral:  Positive for decreased concentration and sleep disturbance. The patient is nervous/anxious.   All other systems reviewed and are negative.   There were no vitals taken for this visit.There is no height or weight on file to calculate BMI.  General Appearance: Casual  Eye Contact:  Good  Speech:  Clear and Coherent  Volume:  Normal  Mood:  Anxious and Depressed  Affect:  Congruent  Thought Process:  Coherent  Orientation:  Full (Time, Place, and Person)  Thought Content:  Logical  Suicidal Thoughts:  No  Homicidal Thoughts:  No  Memory:  Immediate;   Good Recent;   Good  Judgement:  Good  Insight:  Fair  Psychomotor Activity:  Normal  Concentration:  Concentration: Fair  Recall:  Fair  Fund of Knowledge:Good  Language: Good  Akathisia:  No  Handed:  Right  AIMS (if indicated):  done  Assets:  Communication Skills Desire for Improvement Resilience Social Support  ADL's:  Intact  Cognition: WNL  Sleep:  Poor   Screenings: GAD-7    Flowsheet Row Office Visit from 02/26/2023 in [facility]  Total GAD-7 Score 19      PHQ2-9    Flowsheet Row Counselor from 03/04/2023 in [facility] Office Visit from 02/26/2023 in [facility] ED from 02/24/2023 in [facility] Nutrition from 04/05/2019 in [facility] Visit from 03/11/2019 in [facility] Total Score 6 6 0 0 0  PHQ-9 Total Score 25 23 -- -- --       Advertising copywriter from 03/04/2023 in [facility] Office Visit from 02/26/2023 in [facility] ED from 02/24/2023 in [facility]  C-SSRS RISK CATEGORY No Risk No Risk No Risk       Assessment and Plan:  Patient to start Partial Hospitalization  Continue Vraylar 1.5 mg daily Continue Lexapro 10 mg daily Continue Gabapentin 400 mg BID   Collaboration of Care: Medication Management AEB See above   Patient/Guardian was advised Release of Information must be obtained prior to any record release in order to collaborate their care with an outside provider. Patient/Guardian was advised if they have not already done so to contact the registration department to sign all necessary forms in order for [place] to release information regarding their care.   Consent: Patient/Guardian gives verbal consent for treatment and assignment of benefits for services provided during this visit. Patient/Guardian expressed understanding and agreed to proceed.   Oneta Rack, NP 9/24/202410:49 AM

## 2023-03-11 ENCOUNTER — Telehealth (HOSPITAL_COMMUNITY): Payer: Self-pay | Admitting: Licensed Clinical Social Worker

## 2023-03-11 ENCOUNTER — Ambulatory Visit (HOSPITAL_COMMUNITY): Payer: Medicaid Other

## 2023-03-11 ENCOUNTER — Encounter (HOSPITAL_COMMUNITY): Payer: Self-pay

## 2023-03-12 ENCOUNTER — Ambulatory Visit (HOSPITAL_COMMUNITY): Payer: Medicaid Other | Admitting: Licensed Clinical Social Worker

## 2023-03-12 ENCOUNTER — Other Ambulatory Visit: Payer: Self-pay | Admitting: *Deleted

## 2023-03-12 ENCOUNTER — Encounter: Payer: Self-pay | Admitting: Hematology and Oncology

## 2023-03-12 ENCOUNTER — Other Ambulatory Visit (HOSPITAL_COMMUNITY): Payer: Self-pay

## 2023-03-12 DIAGNOSIS — D696 Thrombocytopenia, unspecified: Secondary | ICD-10-CM

## 2023-03-12 DIAGNOSIS — F3181 Bipolar II disorder: Secondary | ICD-10-CM

## 2023-03-13 ENCOUNTER — Ambulatory Visit (HOSPITAL_COMMUNITY): Payer: Medicaid Other | Admitting: Licensed Clinical Social Worker

## 2023-03-13 ENCOUNTER — Inpatient Hospital Stay: Payer: Medicaid Other

## 2023-03-13 ENCOUNTER — Encounter: Payer: Self-pay | Admitting: *Deleted

## 2023-03-13 ENCOUNTER — Telehealth (HOSPITAL_COMMUNITY): Payer: Self-pay | Admitting: Licensed Clinical Social Worker

## 2023-03-13 VITALS — BP 119/73 | HR 95 | Temp 98.7°F | Resp 19

## 2023-03-13 DIAGNOSIS — D509 Iron deficiency anemia, unspecified: Secondary | ICD-10-CM

## 2023-03-13 DIAGNOSIS — F3181 Bipolar II disorder: Secondary | ICD-10-CM

## 2023-03-13 DIAGNOSIS — D696 Thrombocytopenia, unspecified: Secondary | ICD-10-CM

## 2023-03-13 LAB — CBC WITH DIFFERENTIAL (CANCER CENTER ONLY)
Abs Immature Granulocytes: 0 10*3/uL (ref 0.00–0.07)
Basophils Absolute: 0 10*3/uL (ref 0.0–0.1)
Basophils Relative: 1 %
Eosinophils Absolute: 0.2 10*3/uL (ref 0.0–0.5)
Eosinophils Relative: 7 %
HCT: 34.4 % — ABNORMAL LOW (ref 36.0–46.0)
Hemoglobin: 10.6 g/dL — ABNORMAL LOW (ref 12.0–15.0)
Immature Granulocytes: 0 %
Lymphocytes Relative: 34 %
Lymphs Abs: 0.8 10*3/uL (ref 0.7–4.0)
MCH: 28.7 pg (ref 26.0–34.0)
MCHC: 30.8 g/dL (ref 30.0–36.0)
MCV: 93.2 fL (ref 80.0–100.0)
Monocytes Absolute: 0.2 10*3/uL (ref 0.1–1.0)
Monocytes Relative: 7 %
Neutro Abs: 1.2 10*3/uL — ABNORMAL LOW (ref 1.7–7.7)
Neutrophils Relative %: 51 %
Platelet Count: 70 10*3/uL — ABNORMAL LOW (ref 150–400)
RBC: 3.69 MIL/uL — ABNORMAL LOW (ref 3.87–5.11)
RDW: 15.7 % — ABNORMAL HIGH (ref 11.5–15.5)
WBC Count: 2.5 10*3/uL — ABNORMAL LOW (ref 4.0–10.5)
nRBC: 0.8 % — ABNORMAL HIGH (ref 0.0–0.2)

## 2023-03-13 LAB — SAMPLE TO BLOOD BANK

## 2023-03-13 MED ORDER — SODIUM CHLORIDE 0.9 % IV SOLN
1000.0000 mg | Freq: Once | INTRAVENOUS | Status: AC
  Administered 2023-03-13: 1000 mg via INTRAVENOUS
  Filled 2023-03-13: qty 10

## 2023-03-13 MED ORDER — SODIUM CHLORIDE 0.9 % IV SOLN: Freq: Once | INTRAVENOUS | Status: AC

## 2023-03-13 NOTE — Progress Notes (Signed)
Pt no show for iron infusion today.  Nursing staff attempt x1 to contact pt, no answer.  Sent message to scheduling team to re-schedule.

## 2023-03-13 NOTE — Progress Notes (Signed)
Pt observed for 30 min post infusion. Tolerated well. No hx adverse s/s to iron. VSS at time of discharge. Declined AVS. Ambulated independently to lobby for d/c.

## 2023-03-13 NOTE — Patient Instructions (Signed)

## 2023-03-14 ENCOUNTER — Other Ambulatory Visit (HOSPITAL_COMMUNITY): Payer: Self-pay

## 2023-03-15 NOTE — Psych (Signed)
Virtual Visit via Video Note  I connected with Linda Becker on 03/10/23 at  9:00 AM EDT by a video enabled telemedicine application and verified that I am speaking with the correct person using two identifiers.  Location: Patient: patient home Provider: clinical home office   I discussed the limitations of evaluation and management by telemedicine and the availability of in person appointments. The patient expressed understanding and agreed to proceed.  I discussed the assessment and treatment plan with the patient. The patient was provided an opportunity to ask questions and all were answered. The patient agreed with the plan and demonstrated an understanding of the instructions.   The patient was advised to call back or seek an in-person evaluation if the symptoms worsen or if the condition fails to improve as anticipated.  Pt was provided 240 minutes of non-face-to-face time during this encounter.   Donia Guiles, LCSW   [facility] PHP THERAPIST PROGRESS NOTE  Linda Becker  Session Time: 9:00 - 10:00  Participation Level: Active  Behavioral Response: CasualAlertDepressed  Type of Therapy: Group Therapy  Treatment Goals addressed: Coping  Progress Towards Goals: Initial  Interventions: CBT, DBT, Supportive, and Reframing  Summary: Linda Becker is a 32 y.o. female who presents with depression and anxiety symptoms.  Clinician led check-in regarding current stressors and situation, and review of patient completed daily inventory. Clinician utilized active listening and empathetic response and validated patient emotions. Clinician facilitated processing group on pertinent issues.?    Therapist Response:  Patient arrived within time allowed. Patient rates her mood at a 3 on a scale of 1-10 with 10 being best. Pt states she feels "overwhelmed." Pt states she slept 2 hours and ate 1x. Pt reports "there is so much stuff I haven't dealt with." Pt reports  she is living with dad because her husband dropped her off after finding out she was no longer pregnant but had not communicated that for multiple months. Pt reports she does not understand her actions and doesn't like where they got her. Pt shares she has liver failure and is on a transplant list. Pt identifies passive SI and denies plan/intent. Patient able to process. Patient engaged in discussion.           Session Time: 10:00 am - 11:00 am   Participation Level: Active   Behavioral Response: CasualAlertDepressed   Type of Therapy: Group Therapy   Treatment Goals addressed: Coping   Progress Towards Goals: Progressing   Interventions: CBT, DBT, Solution Focused, Strength-based, Supportive, and Reframing   Therapist Response: Cln introduced DBT distress tolerance distraction skills. Cln provides context for distraction skills and why distraction is a foundational way to manage mood dysregulation. Group discussed how to apply distraction.    Therapist Response:  Pt engaged in discussion and reports understanding of how distraction can be applied to manage big feelings.         Session Time: 11:00 -12:00   Participation Level: Active   Behavioral Response: CasualAlertDepressed   Type of Therapy: Group Therapy   Treatment Goals addressed: Coping   Progress Towards Goals: Progressing   Interventions: CBT, DBT, Solution Focused, Strength-based, Supportive, and Reframing   Summary: Cln continued topic of DBT distress tolerance skills. Cln introduced the ACCEPTS distraction skills and group discusses ways to utilize "A" activities.    Therapist Response: Pt engaged in discussion and is able to state ways to apply this skill.  Session Time: 12:00 -1:00   Participation Level: Active   Behavioral Response: CasualAlertDepressed   Type of Therapy: Group therapy   Treatment Goals addressed: Coping   Progress Towards Goals: Progressing   Interventions: OT  group   Summary: 12:00 - 12:50: Occupational Therapy group with cln E. Hollan.  12:50 - 1:00 Clinician assessed for immediate needs, medication compliance and efficacy, and safety concerns.   Therapist Response: 12:00 - 12:50: Pt participated 12:50 - 1:00 pm: At check-out, patient reports no immediate concerns. Patient demonstrates progress as evidenced by participation in first group session. Patient denies SI/HI/self-harm thoughts at the end of group.     Suicidal/Homicidal: Nowithout intent/plan  Plan: Pt will continue in PHP while working to decrease depression and anxiety symptoms, increase emotion regulation, and increase ability to manage symptoms in a healthy manner.   Collaboration of Care: Medication Management AEB T Lewis  Patient/Guardian was advised Release of Information must be obtained prior to any record release in order to collaborate their care with an outside provider. Patient/Guardian was advised if they have not already done so to contact the registration department to sign all necessary forms in order for [place] to release information regarding their care.   Consent: Patient/Guardian gives verbal consent for treatment and assignment of benefits for services provided during this visit. Patient/Guardian expressed understanding and agreed to proceed.   Diagnosis: Bipolar 2 disorder (HCC) [F31.81]    1. Bipolar 2 disorder (HCC)   2. Generalized anxiety disorder       Donia Guiles, LCSW

## 2023-03-16 ENCOUNTER — Telehealth: Payer: Self-pay | Admitting: Internal Medicine

## 2023-03-16 ENCOUNTER — Telehealth (HOSPITAL_COMMUNITY): Payer: Self-pay | Admitting: Professional

## 2023-03-16 ENCOUNTER — Ambulatory Visit (HOSPITAL_COMMUNITY): Payer: Medicaid Other

## 2023-03-16 ENCOUNTER — Other Ambulatory Visit: Payer: Self-pay

## 2023-03-16 ENCOUNTER — Other Ambulatory Visit (HOSPITAL_COMMUNITY): Payer: Self-pay

## 2023-03-16 ENCOUNTER — Encounter (HOSPITAL_COMMUNITY): Payer: Self-pay

## 2023-03-16 NOTE — Telephone Encounter (Signed)
Clarified that pain management has received the referral and is reviewing it to be scheduled.

## 2023-03-16 NOTE — Telephone Encounter (Signed)
Inbound call from pain management wanting to inform that they have received referral for patient. Please advise, thank you.

## 2023-03-16 NOTE — Psych (Signed)
Pt did not attend PHP due to conflicting appt

## 2023-03-16 NOTE — Psych (Signed)
Pt did not attend PHP due to feeling ill.

## 2023-03-17 ENCOUNTER — Other Ambulatory Visit (HOSPITAL_COMMUNITY): Payer: Self-pay

## 2023-03-17 ENCOUNTER — Other Ambulatory Visit: Payer: Self-pay

## 2023-03-17 ENCOUNTER — Other Ambulatory Visit: Payer: Self-pay | Admitting: Internal Medicine

## 2023-03-17 MED FILL — Famotidine Tab 40 MG: ORAL | 30 days supply | Qty: 60 | Fill #0 | Status: CN

## 2023-03-18 ENCOUNTER — Ambulatory Visit (HOSPITAL_COMMUNITY): Payer: Medicaid Other

## 2023-03-18 ENCOUNTER — Telehealth (HOSPITAL_COMMUNITY): Payer: Self-pay | Admitting: *Deleted

## 2023-03-18 ENCOUNTER — Other Ambulatory Visit: Payer: Self-pay

## 2023-03-18 ENCOUNTER — Other Ambulatory Visit (HOSPITAL_COMMUNITY): Payer: Self-pay

## 2023-03-18 NOTE — Telephone Encounter (Signed)
Patient called asking for new prescription for Gabapentin. States that she is separated from her husband and her medication was left at his house. She can't get it due to not having a car and states "it's a bad situation right now." Was unable to get her Vraylar as well, pharmacy stated that a prior authorization was needed. Submitted online with cover my meds, message stated that prior authorization was not needed. Called to notify pharmacy who then re-ran medication and it went through without problems. Notified patient that medication is available to pick up. Message sent to MD to review Gabapentin.

## 2023-03-19 ENCOUNTER — Encounter: Payer: Self-pay | Admitting: Internal Medicine

## 2023-03-19 ENCOUNTER — Other Ambulatory Visit (HOSPITAL_COMMUNITY): Payer: Self-pay

## 2023-03-19 ENCOUNTER — Ambulatory Visit (HOSPITAL_COMMUNITY): Payer: Medicaid Other

## 2023-03-19 ENCOUNTER — Other Ambulatory Visit: Payer: Self-pay

## 2023-03-19 MED ORDER — NA SULFATE-K SULFATE-MG SULF 17.5-3.13-1.6 GM/177ML PO SOLN
ORAL | 0 refills | Status: DC
  Filled 2023-03-19: qty 354, 1d supply, fill #0

## 2023-03-20 ENCOUNTER — Other Ambulatory Visit (HOSPITAL_COMMUNITY): Payer: Self-pay | Admitting: Physician Assistant

## 2023-03-20 ENCOUNTER — Other Ambulatory Visit: Payer: Self-pay

## 2023-03-20 ENCOUNTER — Ambulatory Visit (HOSPITAL_COMMUNITY): Payer: Medicaid Other

## 2023-03-20 ENCOUNTER — Other Ambulatory Visit (HOSPITAL_COMMUNITY): Payer: Self-pay

## 2023-03-20 DIAGNOSIS — F3181 Bipolar II disorder: Secondary | ICD-10-CM

## 2023-03-20 DIAGNOSIS — F411 Generalized anxiety disorder: Secondary | ICD-10-CM

## 2023-03-20 MED ORDER — GABAPENTIN 400 MG PO CAPS
400.0000 mg | ORAL_CAPSULE | Freq: Two times a day (BID) | ORAL | 1 refills | Status: DC
Start: 2023-03-20 — End: 2023-04-08
  Filled 2023-03-20 (×2): qty 60, 30d supply, fill #0

## 2023-03-20 NOTE — Telephone Encounter (Signed)
Medication management - Call with patient, after she left a message she wanted to know if provider would send in a new order for her Gabapentin and to allow her fill early at the [facility], stating she had left her previous bottle at her home when she separated from her husband and could not get back in to get them. Agreed to send request to Otila Back, PA-C for request of a new order of Gabapentin and to fill early.

## 2023-03-20 NOTE — Progress Notes (Signed)
Provider was contacted by Everlene Balls, RN guarding patient's request for her gabapentin to be refilled due to not having access to her current prescription.  She reports that she had left her previous bottle at home with her husband who she is separated from.  Provider to refill patient's prescription to pharmacy of choice.

## 2023-03-21 ENCOUNTER — Other Ambulatory Visit (HOSPITAL_COMMUNITY): Payer: Self-pay

## 2023-03-23 ENCOUNTER — Encounter: Payer: Self-pay | Admitting: Internal Medicine

## 2023-03-23 ENCOUNTER — Ambulatory Visit (HOSPITAL_COMMUNITY): Payer: Medicaid Other

## 2023-03-24 ENCOUNTER — Ambulatory Visit (AMBULATORY_SURGERY_CENTER): Payer: Medicaid Other | Admitting: Internal Medicine

## 2023-03-24 ENCOUNTER — Ambulatory Visit (HOSPITAL_COMMUNITY): Payer: Medicaid Other

## 2023-03-24 ENCOUNTER — Encounter: Payer: Self-pay | Admitting: Internal Medicine

## 2023-03-24 VITALS — BP 112/64 | HR 77 | Temp 98.2°F | Resp 16 | Ht 65.0 in | Wt 196.0 lb

## 2023-03-24 DIAGNOSIS — K7460 Unspecified cirrhosis of liver: Secondary | ICD-10-CM

## 2023-03-24 DIAGNOSIS — D509 Iron deficiency anemia, unspecified: Secondary | ICD-10-CM | POA: Diagnosis not present

## 2023-03-24 DIAGNOSIS — K259 Gastric ulcer, unspecified as acute or chronic, without hemorrhage or perforation: Secondary | ICD-10-CM

## 2023-03-24 DIAGNOSIS — I8500 Esophageal varices without bleeding: Secondary | ICD-10-CM

## 2023-03-24 DIAGNOSIS — Z860100 Personal history of colon polyps, unspecified: Secondary | ICD-10-CM

## 2023-03-24 DIAGNOSIS — K2950 Unspecified chronic gastritis without bleeding: Secondary | ICD-10-CM | POA: Diagnosis not present

## 2023-03-24 HISTORY — PX: COLONOSCOPY WITH ESOPHAGOGASTRODUODENOSCOPY (EGD): SHX5779

## 2023-03-24 MED ORDER — SODIUM CHLORIDE 0.9 % IV SOLN: 500.0000 mL | Freq: Once | INTRAVENOUS | Status: DC

## 2023-03-24 NOTE — Progress Notes (Signed)
Report to PACU, RN, vss, BBS= Clear.  

## 2023-03-24 NOTE — Progress Notes (Signed)
Pt's states no medical or surgical changes since previsit or office visit. 

## 2023-03-24 NOTE — Op Note (Signed)
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 The patient's tolerance of                            previous anesthesia was also reviewed. The risks                            and benefits of the procedure and the sedation                            options and risks were discussed with the patient.                            All questions were answered, and informed consent                            was obtained. Prior Anticoagulants: The patient has                            taken no anticoagulant or antiplatelet agents. ASA                            Grade Assessment: III - A patient with severe  systemic disease. After reviewing the risks and                            benefits, the patient was deemed in satisfactory                            condition to undergo the procedure.                           After obtaining informed consent, the endoscope was                            passed under direct vision. Throughout the                             procedure, the patient's blood pressure, pulse, and                            oxygen saturations were monitored continuously. The                            GIF [id number] #[id number] was introduced through the                            mouth, and advanced to the second part of duodenum.                            The upper GI endoscopy was accomplished without                            difficulty. The patient tolerated the procedure                            well. Scope In: Scope Out: Findings:                 Small (< 5 mm) varices were found in the lower                            third of the esophagus.                           A 2 cm hiatal hernia was present.                           Two non-bleeding linear gastric ulcers with no                            stigmata of bleeding were found in the gastric                            antrum. The largest lesion was 5 mm in largest                            dimension. Biopsies  were taken with a cold forceps                            for histology.                           Moderate inflammation characterized by erythema and                            nodularity was found in the second portion of the                            duodenum. Biopsies were taken with a cold forceps                            for histology. Complications:            No immediate complications. Estimated Blood Loss:     Estimated blood loss was minimal. Impression:               - Small (< 5 mm) esophageal varices.                           - 2 cm hiatal hernia.                           - Non-bleeding gastric ulcers with no stigmata of                            bleeding. Biopsied.                           - Prior gastric sleeve.                           - Duodenitis. Biopsied. Recommendation:           - Patient has a contact number available for                            emergencies. The signs and symptoms of potential                            delayed  complications were discussed with the                            patient. Return to normal activities tomorrow.                            Written discharge instructions were provided to the                            patient.                           - Resume previous diet.                           -  Continue present medications. No aspirin or                            NSAIDs.                           - Await pathology results.                           - See the other procedure note for documentation of                            additional recommendations. Beverley Fiedler, MD 03/24/2023 2:22:15 PM This report has been signed electronically.

## 2023-03-24 NOTE — Patient Instructions (Addendum)
- Resume previous diet. - Repeat colonoscopy at appointment to be scheduled because the bowel preparation was poor. 2 day bowel prep with at least 7 day hold of oral iron.  - Continue present medications. No aspirin or NSAIDs. - Await pathology results. - See handout on gastric ulcers     YOU HAD AN ENDOSCOPIC PROCEDURE TODAY AT THE [facility]:   Refer to the procedure report that was given to you for any specific questions about what was found during the examination.  If the procedure report does not answer your questions, please call your gastroenterologist to clarify.  If you requested that your care partner not be given the details of your procedure findings, then the procedure report has been included in a sealed envelope for you to review at your convenience later.  YOU SHOULD EXPECT: Some feelings of bloating in the abdomen. Passage of more gas than usual.  Walking can help get rid of the air that was put into your GI tract during the procedure and reduce the bloating. If you had a lower endoscopy (such as a colonoscopy or flexible sigmoidoscopy) you may notice spotting of blood in your stool or on the toilet paper. If you underwent a bowel prep for your procedure, you may not have a normal bowel movement for a few days.  Please Note:  You might notice some irritation and congestion in your nose or some drainage.  This is from the oxygen used during your procedure.  There is no need for concern and it should clear up in a day or so.  SYMPTOMS TO REPORT IMMEDIATELY:  Following lower endoscopy (colonoscopy or flexible sigmoidoscopy):  Excessive amounts of blood in the stool  Significant tenderness or worsening of abdominal pains  Swelling of the abdomen that is new, acute  Fever of 100F or higher  Following upper endoscopy (EGD)  Vomiting of blood or coffee ground material  New chest pain or pain under the shoulder blades  Painful or persistently difficult  swallowing  New shortness of breath  Fever of 100F or higher  Black, tarry-looking stools  For urgent or emergent issues, a gastroenterologist can be reached at any hour by calling [phone]. Do not use MyChart messaging for urgent concerns.    DIET:  We do recommend a small meal at first, but then you may proceed to your regular diet.  Drink plenty of fluids but you should avoid alcoholic beverages for 24 hours.  ACTIVITY:  You should plan to take it easy for the rest of today and you should NOT DRIVE or use heavy machinery until tomorrow (because of the sedation medicines used during the test).    FOLLOW UP: Our staff will call the number listed on your records the next business day following your procedure.  We will call around 7:15- 8:00 am to check on you and address any questions or concerns that you may have regarding the information given to you following your procedure. If we do not reach you, we will leave a message.     If any biopsies were taken you will be contacted by phone or by letter within the next 1-3 weeks.  Please call us at [phone] if you have not heard about the biopsies in 3 weeks.    SIGNATURES/CONFIDENTIALITY: You and/or your care partner have signed paperwork which will be entered into your electronic medical record.  These signatures attest to the fact that that the information above on your After Visit  Summary has been reviewed and is understood.  Full responsibility of the confidentiality of this discharge information lies with you and/or your care-partner.

## 2023-03-24 NOTE — Progress Notes (Signed)
Called to room to assist during endoscopic procedure.  Patient ID and intended procedure confirmed with present staff. Received instructions for my participation in the procedure from the performing physician.  

## 2023-03-24 NOTE — Op Note (Signed)
[facility] Patient Name: Linda Becker Procedure Date: 03/24/2023 1:42 PM MRN: [id number] Endoscopist: Beverley Fiedler , MD, [id number] Age: 32 Referring MD:  Date of Birth: 1990/11/10 Gender: Female Account #: [id number] Procedure:                Colonoscopy Indications:              Iron deficiency anemia, history of SSPs in March                            2020 Medicines:                Monitored Anesthesia Care Procedure:                Pre-Anesthesia Assessment:                           - Prior to the procedure, a History and Physical                            was performed, and patient medications and                            allergies were reviewed. The patient's tolerance of                            previous anesthesia was also reviewed. The risks                            and benefits of the procedure and the sedation                            options and risks were discussed with the patient.                            All questions were answered, and informed consent                            was obtained. Prior Anticoagulants: The patient has                            taken no anticoagulant or antiplatelet agents. ASA                            Grade Assessment: III - A patient with severe                            systemic disease. After reviewing the risks and                            benefits, the patient was deemed in satisfactory                            condition to undergo the procedure.  After obtaining informed consent, the colonoscope                            was passed under direct vision. Throughout the                            procedure, the patient's blood pressure, pulse, and                            oxygen saturations were monitored continuously. The                            CF [id number] #[id number] was introduced through the anus                            with the intention of advancing to the cecum.  The                            scope was advanced to the transverse colon before                            the procedure was aborted. Medications were given.                            The colonoscopy was performed without difficulty.                            The patient tolerated the procedure well. The                            quality of the bowel preparation was poor. Scope In: 2:07:43 PM Scope Out: 2:16:01 PM Total Procedure Duration: 0 hours 8 minutes 18 seconds  Findings:                 The digital rectal exam was normal.                           Extensive amounts of semi-liquid stool was found in                            the entire colon, interfering with visualization.                            Despite irrigation and lavage I was unable to get                            adequate visualization. Complications:            No immediate complications. Estimated Blood Loss:     Estimated blood loss: none. Impression:               - Preparation of the colon was poor.                           - No specimens collected.  Recommendation:           - Patient has a contact number available for                            emergencies. The signs and symptoms of potential                            delayed complications were discussed with the                            patient. Return to normal activities tomorrow.                            Written discharge instructions were provided to the                            patient.                           - Resume previous diet.                           - Continue present medications.                           - Repeat colonoscopy at appointment to be scheduled                            because the bowel preparation was poor. 2 day bowel                            prep with at least 7 day hold of oral iron. Beverley Fiedler, MD 03/24/2023 2:25:51 PM This report has been signed electronically.

## 2023-03-24 NOTE — Telephone Encounter (Signed)
Message acknowledged and reviewed.  Patient's medication was sent to the preferred pharmacy.
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
## 2023-03-25 ENCOUNTER — Telehealth: Payer: Self-pay

## 2023-03-25 ENCOUNTER — Ambulatory Visit (HOSPITAL_COMMUNITY): Payer: Medicaid Other

## 2023-03-25 NOTE — Telephone Encounter (Signed)
  Follow up Call-     03/24/2023    1:27 PM  Call back number  Post procedure Call Back phone  # [phone]  Permission to leave phone message Yes     Patient questions:  Do you have a fever, pain , or abdominal swelling? No. Pain Score  0 *  Have you tolerated food without any problems? Yes.    Have you been able to return to your normal activities? Yes.    Do you have any questions about your discharge instructions: Diet   No. Medications  No. Follow up visit  No.  Do you have questions or concerns about your Care? No.  Actions: * If pain score is 4 or above: No action needed, pain <4.

## 2023-03-26 ENCOUNTER — Ambulatory Visit (HOSPITAL_COMMUNITY): Payer: Medicaid Other

## 2023-03-27 ENCOUNTER — Other Ambulatory Visit (HOSPITAL_COMMUNITY): Payer: Self-pay

## 2023-03-27 ENCOUNTER — Ambulatory Visit (HOSPITAL_COMMUNITY): Payer: Medicaid Other

## 2023-03-29 ENCOUNTER — Encounter: Payer: Self-pay | Admitting: Internal Medicine

## 2023-03-30 ENCOUNTER — Ambulatory Visit (HOSPITAL_COMMUNITY): Payer: Medicaid Other

## 2023-03-30 ENCOUNTER — Other Ambulatory Visit (HOSPITAL_COMMUNITY): Payer: Self-pay

## 2023-03-30 ENCOUNTER — Other Ambulatory Visit: Payer: Self-pay

## 2023-03-30 LAB — SURGICAL PATHOLOGY

## 2023-03-30 MED ORDER — NA SULFATE-K SULFATE-MG SULF 17.5-3.13-1.6 GM/177ML PO SOLN
ORAL | 0 refills | Status: DC
  Filled 2023-03-30: qty 354, 2d supply, fill #0

## 2023-03-30 MED ORDER — ACETAMINOPHEN-CODEINE 300-30 MG PO TABS
1.0000 | ORAL_TABLET | Freq: Four times a day (QID) | ORAL | 0 refills | Status: DC
  Filled 2023-03-30: qty 12, 3d supply, fill #0

## 2023-03-31 ENCOUNTER — Telehealth: Payer: Self-pay | Admitting: Internal Medicine

## 2023-03-31 ENCOUNTER — Ambulatory Visit (HOSPITAL_COMMUNITY): Payer: Medicaid Other

## 2023-03-31 DIAGNOSIS — K920 Hematemesis: Secondary | ICD-10-CM

## 2023-03-31 DIAGNOSIS — K7460 Unspecified cirrhosis of liver: Secondary | ICD-10-CM

## 2023-03-31 DIAGNOSIS — K259 Gastric ulcer, unspecified as acute or chronic, without hemorrhage or perforation: Secondary | ICD-10-CM

## 2023-03-31 NOTE — Telephone Encounter (Signed)
Pt well known to me Called with single episode of hematemesis (bright red low to medium vol) mixed with bowel prep Plan for for colonoscopy tomorrow and in middle of 2 day prep Nausea today more than normal for her  EGD just last week, small varices and 2 small nonbleeding rather shallow ulcers. Bx done and neg for infection dysplasia.  She has known portal HTN and has had variceal bleeding in the past. Again varices small last week and on nadolol so overall variceal hemorrhage is possible but felt less likely. Possibly retching or even small mallory-weiss tear also in diff.  Regardless: Cancel colon prep and plan for colonoscopy tomorrow If any further hematemesis or should she have blood in stools or melena (none to this point) then 911 and immediate ER eval. She voices understanding  No cp, fever, dyspnea or presyncopal symptoms at present  CBC, INR tomorrow at [facility] lab if no further evidence of bleeding tonight.  Eventually resch colonoscopy with 2 day prep We also reviewed path results from last weeks EGD by phone tonight.

## 2023-04-01 ENCOUNTER — Telehealth: Payer: Self-pay | Admitting: Internal Medicine

## 2023-04-01 ENCOUNTER — Ambulatory Visit (HOSPITAL_COMMUNITY): Payer: Medicaid Other

## 2023-04-01 ENCOUNTER — Encounter: Payer: Medicaid Other | Admitting: Internal Medicine

## 2023-04-01 ENCOUNTER — Other Ambulatory Visit: Payer: Self-pay

## 2023-04-01 DIAGNOSIS — K920 Hematemesis: Secondary | ICD-10-CM

## 2023-04-01 DIAGNOSIS — K7460 Unspecified cirrhosis of liver: Secondary | ICD-10-CM

## 2023-04-01 NOTE — Addendum Note (Signed)
Addended by: Selinda Michaels R on: 04/01/2023 09:26 AM   Modules accepted: Orders

## 2023-04-01 NOTE — Telephone Encounter (Signed)
Inbound call from [facility] requesting to speak with Linda Becker regarding status of referral that was sent over. States they reached out to patient on September 30th and patient stated she will call back. Left a message on October 7th, have not heard back from patient. Please advise, thank you.

## 2023-04-01 NOTE — Telephone Encounter (Signed)
Colon appt cancelled, orders in epic for labs. Left message for pt to come for labs. Colon rescheduled for 06/15/23 at 2:30pm. Updated instructions sent to pt via mychart. Left message for pt  to call back if this date did not work for her. Amb ref entered for new procedure date.

## 2023-04-01 NOTE — Telephone Encounter (Signed)
[facility] has reached out to patient multiple times with no response. Called patient with no answer. Will send patient MyChart message seeing patient it active in MyChart.

## 2023-04-02 ENCOUNTER — Ambulatory Visit (HOSPITAL_COMMUNITY): Payer: Medicaid Other

## 2023-04-03 ENCOUNTER — Ambulatory Visit (HOSPITAL_COMMUNITY): Payer: Medicaid Other

## 2023-04-03 ENCOUNTER — Other Ambulatory Visit (HOSPITAL_COMMUNITY): Payer: Self-pay

## 2023-04-03 ENCOUNTER — Other Ambulatory Visit (INDEPENDENT_AMBULATORY_CARE_PROVIDER_SITE_OTHER): Payer: Medicaid Other

## 2023-04-03 DIAGNOSIS — K920 Hematemesis: Secondary | ICD-10-CM

## 2023-04-03 DIAGNOSIS — K7460 Unspecified cirrhosis of liver: Secondary | ICD-10-CM | POA: Diagnosis not present

## 2023-04-03 LAB — CBC WITH DIFFERENTIAL/PLATELET
Basophils Absolute: 0 10*3/uL (ref 0.0–0.1)
Basophils Relative: 0.8 % (ref 0.0–3.0)
Eosinophils Absolute: 0.2 10*3/uL (ref 0.0–0.7)
Eosinophils Relative: 7.3 % — ABNORMAL HIGH (ref 0.0–5.0)
HCT: 32.8 % — ABNORMAL LOW (ref 36.0–46.0)
Hemoglobin: 10.3 g/dL — ABNORMAL LOW (ref 12.0–15.0)
Lymphocytes Relative: 30.6 % (ref 12.0–46.0)
Lymphs Abs: 0.6 10*3/uL — ABNORMAL LOW (ref 0.7–4.0)
MCHC: 31.3 g/dL (ref 30.0–36.0)
MCV: 94.2 fL (ref 78.0–100.0)
Monocytes Absolute: 0.1 10*3/uL (ref 0.1–1.0)
Monocytes Relative: 5.7 % (ref 3.0–12.0)
Neutro Abs: 1.2 10*3/uL — ABNORMAL LOW (ref 1.4–7.7)
Neutrophils Relative %: 55.6 % (ref 43.0–77.0)
Platelets: 57 10*3/uL — ABNORMAL LOW (ref 150.0–400.0)
RBC: 3.48 Mil/uL — ABNORMAL LOW (ref 3.87–5.11)
RDW: 18.8 % — ABNORMAL HIGH (ref 11.5–15.5)
WBC: 2.1 10*3/uL — ABNORMAL LOW (ref 4.0–10.5)

## 2023-04-03 LAB — PROTIME-INR
INR: 1.2 {ratio} — ABNORMAL HIGH (ref 0.8–1.0)
Prothrombin Time: 12.7 s (ref 9.6–13.1)

## 2023-04-06 ENCOUNTER — Encounter: Payer: Self-pay | Admitting: Internal Medicine

## 2023-04-06 ENCOUNTER — Other Ambulatory Visit: Payer: Self-pay | Admitting: Internal Medicine

## 2023-04-06 MED FILL — Famotidine Tab 40 MG: ORAL | 30 days supply | Qty: 60 | Fill #0 | Status: CN

## 2023-04-07 ENCOUNTER — Other Ambulatory Visit (HOSPITAL_COMMUNITY): Payer: Self-pay

## 2023-04-07 ENCOUNTER — Other Ambulatory Visit: Payer: Self-pay

## 2023-04-07 MED ORDER — PROMETHAZINE HCL 25 MG RE SUPP
25.0000 mg | Freq: Four times a day (QID) | RECTAL | 1 refills | Status: DC | PRN
  Filled 2023-04-07 – 2023-04-22 (×2): qty 30, 8d supply, fill #0
  Filled 2023-05-20: qty 30, 8d supply, fill #1

## 2023-04-08 ENCOUNTER — Other Ambulatory Visit (HOSPITAL_COMMUNITY): Payer: Self-pay

## 2023-04-08 ENCOUNTER — Encounter: Payer: Self-pay | Admitting: Hematology and Oncology

## 2023-04-08 ENCOUNTER — Other Ambulatory Visit (INDEPENDENT_AMBULATORY_CARE_PROVIDER_SITE_OTHER): Payer: Medicaid Other

## 2023-04-08 ENCOUNTER — Ambulatory Visit: Payer: Medicaid Other | Admitting: Orthopaedic Surgery

## 2023-04-08 ENCOUNTER — Encounter: Payer: Self-pay | Admitting: Orthopaedic Surgery

## 2023-04-08 ENCOUNTER — Other Ambulatory Visit (INDEPENDENT_AMBULATORY_CARE_PROVIDER_SITE_OTHER): Payer: Self-pay

## 2023-04-08 DIAGNOSIS — M25561 Pain in right knee: Secondary | ICD-10-CM | POA: Diagnosis not present

## 2023-04-08 DIAGNOSIS — F3181 Bipolar II disorder: Secondary | ICD-10-CM

## 2023-04-08 DIAGNOSIS — G8929 Other chronic pain: Secondary | ICD-10-CM | POA: Diagnosis not present

## 2023-04-08 DIAGNOSIS — M542 Cervicalgia: Secondary | ICD-10-CM

## 2023-04-08 DIAGNOSIS — F411 Generalized anxiety disorder: Secondary | ICD-10-CM | POA: Diagnosis not present

## 2023-04-08 MED ORDER — PREDNISONE 50 MG PO TABS
50.0000 mg | ORAL_TABLET | Freq: Every day | ORAL | 0 refills | Status: DC
  Filled 2023-04-08 – 2023-04-22 (×2): qty 5, 5d supply, fill #0

## 2023-04-08 MED ORDER — GABAPENTIN 400 MG PO CAPS
400.0000 mg | ORAL_CAPSULE | Freq: Three times a day (TID) | ORAL | 1 refills | Status: DC
  Filled 2023-04-08: qty 60, 20d supply, fill #0

## 2023-04-08 NOTE — Addendum Note (Signed)
Addended by: Mardene Celeste B on: 04/08/2023 04:43 PM   Modules accepted: Orders
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
## 2023-04-09 ENCOUNTER — Encounter (HOSPITAL_COMMUNITY): Payer: Medicaid Other | Admitting: Physician Assistant

## 2023-04-09 ENCOUNTER — Telehealth: Payer: Self-pay | Admitting: Pharmacy Technician

## 2023-04-09 ENCOUNTER — Other Ambulatory Visit (HOSPITAL_COMMUNITY): Payer: Self-pay

## 2023-04-09 NOTE — Telephone Encounter (Signed)
Pharmacy Patient Advocate Encounter   Received notification from CoverMyMeds that prior authorization for NADOLOL 40MG  is required/requested.   Insurance verification completed.   The patient is insured through [employer] [place] .   Per test claim: PA required; PA submitted to [facility] Medicaid via CoverMyMeds Key/confirmation #/[id number] Status is pending

## 2023-04-09 NOTE — Telephone Encounter (Signed)
Yes, CBC, CMP, INR in 4 weeks Followup with me in 6-8 weeks (please put on cancellation list for appt) JMP

## 2023-04-10 ENCOUNTER — Other Ambulatory Visit (HOSPITAL_COMMUNITY): Payer: Self-pay

## 2023-04-10 ENCOUNTER — Other Ambulatory Visit: Payer: Self-pay

## 2023-04-10 DIAGNOSIS — K7460 Unspecified cirrhosis of liver: Secondary | ICD-10-CM

## 2023-04-10 NOTE — Telephone Encounter (Signed)
Pharmacy Patient Advocate Encounter  Received notification from [facility] Medicaid that Prior Authorization for Nadolol 40MG  tablet has been DENIED.  Full denial letter will be uploaded to the media tab. See denial reason below.  Per the health plan's preferred drug list, at least 2 preferred drugs must be tried before requesting this drug or tell us why the member cannot try any preferred alternatives. Here is a list of the preferred alternatives: atenolol tablet (generic for Tenormin, carvedilol tablet (generic for Coreg), labetalol tablet (generic for Trandate), metoprolol succinate XL tablet (generic for Toprol XL , metoprolol tartrate tablet (generic for Lopressor), propranolol solution / tablet /ER capsule (generic for Inderal), Sorine Tablet, sotalol tablet / AF tablet (generic for Betapace/ AF Sorine).  Note: Some preferred drug(s) may have quantity limits  PA #/Case ID/Reference #: [id number]

## 2023-04-13 ENCOUNTER — Other Ambulatory Visit: Payer: Self-pay

## 2023-04-13 ENCOUNTER — Other Ambulatory Visit (HOSPITAL_COMMUNITY): Payer: Self-pay

## 2023-04-13 ENCOUNTER — Telehealth: Payer: Medicaid Other

## 2023-04-13 DIAGNOSIS — M549 Dorsalgia, unspecified: Secondary | ICD-10-CM

## 2023-04-13 DIAGNOSIS — R109 Unspecified abdominal pain: Secondary | ICD-10-CM

## 2023-04-13 DIAGNOSIS — R300 Dysuria: Secondary | ICD-10-CM

## 2023-04-13 MED ORDER — CARVEDILOL 12.5 MG PO TABS
12.5000 mg | ORAL_TABLET | Freq: Two times a day (BID) | ORAL | 2 refills | Status: AC
  Filled 2023-04-13 – 2023-08-09 (×2): qty 60, 30d supply, fill #0

## 2023-04-13 NOTE — Telephone Encounter (Signed)
Patient is advised of insurance preference for her to use carvedilol 12.5 mg twice daily in place of Nadolol. Patient verbalizes understanding. New rx sent for Carvedilol.

## 2023-04-13 NOTE — Telephone Encounter (Signed)
Okay to switch nadolol to carvedilol 12.5 mg twice daily She should be made aware of this change which is necessitated by her insurance provider

## 2023-04-13 NOTE — Telephone Encounter (Signed)
Left message for patient to call back  

## 2023-04-14 ENCOUNTER — Encounter (HOSPITAL_COMMUNITY): Payer: Self-pay | Admitting: Physician Assistant

## 2023-04-14 ENCOUNTER — Telehealth (HOSPITAL_COMMUNITY): Payer: Medicaid Other | Admitting: Physician Assistant

## 2023-04-14 DIAGNOSIS — F411 Generalized anxiety disorder: Secondary | ICD-10-CM | POA: Diagnosis not present

## 2023-04-14 DIAGNOSIS — F3181 Bipolar II disorder: Secondary | ICD-10-CM

## 2023-04-14 MED ORDER — GABAPENTIN 400 MG PO CAPS
400.0000 mg | ORAL_CAPSULE | Freq: Three times a day (TID) | ORAL | 1 refills | Status: DC
  Filled 2023-04-17 – 2023-05-05 (×2): qty 60, 20d supply, fill #0

## 2023-04-14 MED ORDER — CARIPRAZINE HCL 1.5 MG PO CAPS: 1.5000 mg | ORAL_CAPSULE | Freq: Every day | ORAL | 1 refills | Status: DC

## 2023-04-14 MED ORDER — ESCITALOPRAM OXALATE 10 MG PO TABS: 10.0000 mg | ORAL_TABLET | Freq: Every day | ORAL | 1 refills | Status: DC

## 2023-04-14 NOTE — Progress Notes (Unsigned)
BH MD/PA/NP OP Progress Note  Virtual Visit via Video Note  I connected with Linda Becker on 04/14/23 at  1:00 PM EDT by a video enabled telemedicine application and verified that I am speaking with the correct person using two identifiers.  Location: Patient: Home Provider: Clinic   I discussed the limitations of evaluation and management by telemedicine and the availability of in person appointments. The patient expressed understanding and agreed to proceed.  Follow Up Instructions:  I discussed the assessment and treatment plan with the patient. The patient was provided an opportunity to ask questions and all were answered. The patient agreed with the plan and demonstrated an understanding of the instructions.   The patient was advised to call back or seek an in-person evaluation if the symptoms worsen or if the condition fails to improve as anticipated.  I provided 25 minutes of non-face-to-face time during this encounter.  Meta Hatchet, PA    04/14/2023 3:22 PM Linda Becker  MRN:  [id number]  Chief Complaint:  Chief Complaint  Patient presents with   Follow-up   Medication Refill   HPI: ***  Linda Becker  Visit Diagnosis:    ICD-10-CM   1. Bipolar 2 disorder (HCC)  F31.81 cariprazine (VRAYLAR) 1.5 MG capsule    escitalopram (LEXAPRO) 10 MG tablet    gabapentin (NEURONTIN) 400 MG capsule    2. Generalized anxiety disorder  F41.1 escitalopram (LEXAPRO) 10 MG tablet    gabapentin (NEURONTIN) 400 MG capsule      Past Psychiatric History:  Patient endorses a past psychiatric history significant for bipolar 2 disorder, OCD, anxiety, and PTSD   Patient denies a past history of hospitalization due to mental health.  She does report that she was recently voluntarily admitted overnight at [facility] in the past (02/24/2023)   Patient denies a past history of suicide attempts   Patient denies a past history of homicide attempts  Past Medical  History:  Past Medical History:  Diagnosis Date   Allergic rhinitis    remote hx of allergy shots   Asthma    since childhood  on controller meds extrinsic dr Sharyn Lull   Bipolar II disorder (HCC)    Cirrhosis (HCC)    Erosive esophagitis    Esophageal varices (HCC)    Family history of adverse reaction to anesthesia    mom and sister difficult to awaken per patient    Gallbladder sludge    Gastropathy    GERD (gastroesophageal reflux disease)    on nexium  for  long term sx since childhood   H/O miscarriage, not currently pregnant    [redacted] weeks  march 2016   Hepatic steatosis    Hiatal hernia    Hypothyroidism    Migraine    Murmur    pt reports MVP   Portal hypertension (HCC)    Sessile colonic polyp    Splenomegaly    Steatohepatitis    Syncope    under eval ? cause   Tachycardia    episodes with near syncope eval dr Nadara Eaton  on no meds dced LABA   Thrombocytopenia (HCC)    Upper GI bleed     Past Surgical History:  Procedure Laterality Date   BIOPSY  09/03/2018   Procedure: BIOPSY;  Surgeon: Beverley Fiedler, MD;  Location: [facility];  Service: Gastroenterology;;   BIOPSY  09/26/2020   Procedure: BIOPSY;  Surgeon: Beverley Fiedler, MD;  Location: [facility];  Service: Gastroenterology;;  BIOPSY  05/12/2022   Procedure: BIOPSY;  Surgeon: Beverley Fiedler, MD;  [facility]: [facility];  Service: Gastroenterology;;   broken right femur  2008   rod placed   COLONOSCOPY     COLONOSCOPY WITH ESOPHAGOGASTRODUODENOSCOPY (EGD)  03/24/2023   E[facility] Blinks at [facility]   COLONOSCOPY WITH PROPOFOL N/A 09/03/2018   Procedure: COLONOSCOPY WITH PROPOFOL;  Surgeon: Beverley Fiedler, MD;  [facility]: [facility];  Service: Gastroenterology;  Laterality: N/A;   DILATION AND CURETTAGE OF UTERUS     ESOPHAGOGASTRODUODENOSCOPY (EGD) WITH PROPOFOL N/A 09/03/2018   Procedure: ESOPHAGOGASTRODUODENOSCOPY (EGD) WITH PROPOFOL;  Surgeon: Beverley Fiedler, MD;  [facility];  Service: Gastroenterology;   Laterality: N/A;   ESOPHAGOGASTRODUODENOSCOPY (EGD) WITH PROPOFOL N/A 01/27/2020   Procedure: ESOPHAGOGASTRODUODENOSCOPY (EGD) WITH PROPOFOL;  Surgeon: Meridee Score Netty Starring., MD;  Location: [facility] ENDOSCOPY;  Service: Gastroenterology;  Laterality: N/A;   ESOPHAGOGASTRODUODENOSCOPY (EGD) WITH PROPOFOL N/A 03/12/2020   Procedure: ESOPHAGOGASTRODUODENOSCOPY (EGD) WITH PROPOFOL;  Surgeon: Beverley Fiedler, MD;  [facility];  Service: Gastroenterology;  Laterality: N/A;   ESOPHAGOGASTRODUODENOSCOPY (EGD) WITH PROPOFOL N/A 09/26/2020   Procedure: ESOPHAGOGASTRODUODENOSCOPY (EGD) WITH PROPOFOL;  Surgeon: Beverley Fiedler, MD;  [facility];  Service: Gastroenterology;  Laterality: N/A;   ESOPHAGOGASTRODUODENOSCOPY (EGD) WITH PROPOFOL N/A 04/29/2021   Procedure: ESOPHAGOGASTRODUODENOSCOPY (EGD) WITH PROPOFOL;  Surgeon: Beverley Fiedler, MD;  [facility];  Service: Gastroenterology;  Laterality: N/A;   ESOPHAGOGASTRODUODENOSCOPY (EGD) WITH PROPOFOL N/A 05/12/2022   Procedure: ESOPHAGOGASTRODUODENOSCOPY (EGD) WITH PROPOFOL;  Surgeon: Beverley Fiedler, MD;  [facility];  Service: Gastroenterology;  Laterality: N/A;   GASTRIC VARICES BANDING  01/27/2020   Procedure: GASTRIC VARICES BANDING;  Surgeon: Meridee Score Netty Starring., MD;  Location[facility] END[facility]COPY;  Service: Gastroenterology;;   HOT HEMOSTASIS N/A 04/29/2021   Procedure: HOT HEMOSTASIS (ARGON PLASMA COAGULATION/BICAP);  Surgeon: Beverley Fiedler, MD;  Location: [facility];  Service: Gastroenterology;  Laterality: N/A;   IR TRANSCATHETER BX  01/19/2019   IR US GUIDE VASC ACCESS RIGHT  01/19/2019   IR VENOGRAM HEPATIC W HEMODYNAMIC EVALUATION  01/19/2019   LAPAROSCOPIC GASTRIC SLEEVE RESECTION N/A 06/28/2019   Procedure: LAPAROSCOPIC GASTRIC SLEEVE RESECTION, Upper Endo, ERAS Pathway;  Surgeon: Berna Bue, MD;  Location: [facility];  Service: General;  Laterality: N/A;   OB ultrasound N/A 12/01/2017   see report    POLYPECTOMY  09/03/2018   Procedure: POLYPECTOMY;  Surgeon: Beverley Fiedler, MD;  Location: [facility];  Service: Gastroenterology;;   TONSILLECTOMY  2006   UPPER GASTROINTESTINAL ENDOSCOPY     WISDOM TOOTH EXTRACTION      Family Psychiatric History:  Father - depression Sister - bipolar disorder, depression Nephew - Autistic with severe ADHD   Family history of suicide attempt: Patient denies Family history of homicide attempt: Patient denies Family history of substance abuse: Patient denies  Family History:  Family History  Problem Relation Age of Onset   Liver disease Mother 70       advanced non-alcoholic cirrhosis   Arthritis Mother    Hyperlipidemia Mother    Heart disease Mother    Hypertension Mother    Diabetes Mother    Kidney disease Mother    Bleeding Disorder Mother    Colon polyps Mother    Asthma Father    Arthritis Father    Hyperlipidemia Father    Heart disease Father    Hypertension Father    Diabetes Father    Colon polyps Father    Breast  cancer Maternal Aunt    Breast cancer Paternal Aunt    Esophageal cancer Paternal Aunt    Arthritis Maternal Grandmother    Breast cancer Maternal Grandmother    Thyroid disease Maternal Grandmother    Diabetes Maternal Grandfather    Pancreatic cancer Maternal Grandfather    Breast cancer Paternal Grandmother    Diabetes Paternal Grandmother    Colon cancer Paternal Grandfather    Diabetes Paternal Grandfather    Liver cancer Paternal Grandfather    Stomach cancer Paternal Grandfather     Social History:  Social History   Socioeconomic History   Marital status: Married    Spouse name: Not on file   Number of children: 1   Years of education: Not on file   Highest education level: Not on file  Occupational History   Not on file  Tobacco Use   Smoking status: Never   Smokeless tobacco: Never  Vaping Use   Vaping status: Never Used  Substance and Sexual Activity   Alcohol use: No     Alcohol/week: 0.0 standard drinks of alcohol   Drug use: No   Sexual activity: Yes    Birth control/protection: None  Other Topics Concern   Not on file  Social History Narrative   6-7 hours of sleep per night   Works full time   Lives with her parents sis and  Infant nephew   [employer]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   Divorced   8 week preg loss 09-05-22     Son passed away 04-25-18   Social Determinants of Health   Financial Resource Strain: Not on file  Food Insecurity: Food Insecurity Present (02/24/2023)   Hunger Vital Sign    Worried About Running Out of Food in the Last Year: Often true    Ran Out of Food in the Last Year: Often true  Transportation Needs: No Transportation Needs (02/24/2023)   PRAPARE - Administrator, Civil Service (Medical): No    Lack of Transportation (Non-Medical): No  Physical Activity: Not on file  Stress: Not on file  Social Connections: Not on file    Allergies:  Allergies  Allergen Reactions   Progesterone Rash and Other (See Comments)    Was in a form of birth control   Penicillins Rash and Other (See Comments)    Did it involve swelling of the face/tongue/throat, SOB, or low BP? No Did it involve sudden or severe rash/hives, skin peeling, or any reaction on the inside of your mouth or nose? Yes Did you need to seek medical attention at a hospital or doctor's office? No When did it last happen?      9 + months If all above answers are "NO", may proceed with cephalosporin use.      Metabolic Disorder Labs: Lab Results  Component Value Date   HGBA1C 4.4 (L) 02/24/2023   MPG 80 02/24/2023   No results found for: "PROLACTIN" Lab Results  Component Value Date   CHOL 221 (H) 02/24/2023   TRIG 103 02/24/2023   HDL 49 02/24/2023   CHOLHDL 4.5 02/24/2023   VLDL 21 02/24/2023   LDLCALC 151 (H) 02/24/2023   LDLCALC 122 (H) 03/11/2019   Lab Results  Component Value Date   TSH 0.809 02/24/2023   TSH  3.05 08/27/2022    Therapeutic Level Labs: No results found for: "LITHIUM" No results found for: "VALPROATE" No results found for: "  CBMZ"  Current Medications: Current Outpatient Medications  Medication Sig Dispense Refill   acetaminophen-codeine (TYLENOL #3) 300-30 MG tablet Take 1 tablet by mouth every 6 (six) hours. 12 tablet 0   albuterol (PROAIR HFA) 108 (90 Base) MCG/ACT inhaler Inhale 2 puffs into the lungs every 4-6 hours as needed. (Patient taking differently: Inhale 2 puffs into the lungs every 4 (four) hours as needed for wheezing or shortness of breath.) 18 g 0   albuterol (PROVENTIL) (2.5 MG/3ML) 0.083% nebulizer solution Inhale 1 ampule by nebulization every 4 (four) hours as needed for wheezing or shortness of breath. 90 mL 1   Ascorbic Acid (VITAMIN C) 1000 MG tablet Take 2,000 mg by mouth 2 (two) times daily.     budesonide (RHINOCORT AQUA) 32 MCG/ACT nasal spray Place 1 spray into both nostrils in the morning and at bedtime. (Patient taking differently: Place 1 spray into both nostrils 2 (two) times daily as needed for rhinitis or allergies.) 8.43 mL 2   CALCIUM PO Take 1 tablet by mouth 2 (two) times daily.     cariprazine (VRAYLAR) 1.5 MG capsule Take 1 capsule (1.5 mg total) by mouth daily. 30 capsule 1   carvedilol (COREG) 12.5 MG tablet Take 1 tablet (12.5 mg total) by mouth 2 (two) times daily with a meal. 60 tablet 2   Cetirizine HCl 10 MG CAPS Take 10 mg by mouth daily.     diphenhydrAMINE (BENADRYL) 25 MG tablet Take 50 mg by mouth at bedtime as needed for allergies or sleep.     ergocalciferol (VITAMIN D2) 1.25 MG (50000 UT) capsule Take 1 capsule (50,000 Units total) by mouth once a week. (Patient taking differently: Take 50,000 Units by mouth every Monday.) 12 capsule 1   escitalopram (LEXAPRO) 10 MG tablet Take 1 tablet (10 mg total) by mouth daily. 30 tablet 1   famotidine (PEPCID) 40 MG tablet Take 1 tablet (40 mg total) by mouth 2 (two) times daily. Pt need an  office visit for further refills. 60 tablet 0   fluconazole (DIFLUCAN) 150 MG tablet Take 1 tablet (150 mg total) by mouth every three (3) days as needed. (Patient taking differently: Take 150 mg by mouth every three (3) days as needed (For yeast infection).) 3 tablet 0   gabapentin (NEURONTIN) 400 MG capsule Take 1 capsule (400 mg total) by mouth 3 (three) times daily. 60 capsule 1   Na Sulfate-K Sulfate-Mg Sulf 17.5-3.13-1.6 GM/177ML SOLN Take as Directed 354 mL 0   naphazoline-pheniramine (EYE ALLERGY RELIEF) 0.025-0.3 % ophthalmic solution Place 2 drops into both eyes 4 (four) times daily as needed for allergies.     nitrofurantoin, macrocrystal-monohydrate, (MACROBID) 100 MG capsule Take 1 capsule (100 mg total) by mouth 2 (two) times daily. (Patient taking differently: Take 100 mg by mouth 2 (two) times daily. Take for 5 days starting on 02/21/23.) 10 capsule 0   predniSONE (DELTASONE) 50 MG tablet Take 1 tablet (50 mg total) by mouth daily with breakfast. 5 tablet 0   Prenatal Vit-Fe Fumarate-FA (MULTIVITAMIN-PRENATAL) 27-0.8 MG TABS tablet Take 1 tablet by mouth 2 (two) times daily.     promethazine (PHENERGAN) 25 MG suppository Unwrap and insert 1 suppository (25 mg total) rectally every 6 (six) hours as needed for nausea or vomiting. 30 each 1   promethazine (PHENERGAN) 25 MG tablet Take 1 tablet (25 mg total) by mouth every 6 (six) hours as needed. 120 tablet 1   TURMERIC PO Take 1 capsule by mouth in  the morning and at bedtime.     ursodiol (ACTIGALL) 500 MG tablet Take 1 tablet (500 mg total) by mouth 3 (three) times daily (Patient taking differently: Take 500 mg by mouth 3 (three) times daily as needed (For liver disorder).) 90 tablet 11   Vonoprazan Fumarate (VOQUEZNA) 20 MG TABS Take 1 tablet by mouth daily. 30 tablet 1   XIFAXAN 550 MG TABS tablet Take 1 tablet (550 mg total) by mouth 2 (two) times daily. 180 tablet 3   No current facility-administered medications for this visit.    Facility-Administered Medications Ordered in Other Visits  Medication Dose Route Frequency Provider Last Rate Last Admin   0.9 %  sodium chloride infusion   Intravenous Once PRN Pyrtle, Carie Caddy, MD       albuterol (VENTOLIN HFA) 108 (90 Base) MCG/ACT inhaler 2 puff  2 puff Inhalation Once PRN Pyrtle, Carie Caddy, MD       diphenhydrAMINE (BENADRYL) injection 50 mg  50 mg Intravenous Once PRN Pyrtle, Carie Caddy, MD       EPINEPHrine (EPI-PEN) injection 0.3 mg  0.3 mg Intramuscular Once PRN Pyrtle, Carie Caddy, MD       famotidine (PEPCID) IVPB 20 mg premix  20 mg Intravenous Once PRN Pyrtle, Carie Caddy, MD       iron sucrose (VENOFER) 200 mg in sodium chloride 0.9 % 100 mL IVPB  200 mg Intravenous Once Pyrtle, Carie Caddy, MD       methylPREDNISolone sodium succinate (SOLU-MEDROL) 125 mg/2 mL injection 125 mg  125 mg Intravenous Once PRN Pyrtle, Carie Caddy, MD       promethazine (PHENERGAN) suppository 12.5-25 mg  12.5-25 mg Rectal Q4H PRN Berna Bue, MD         Musculoskeletal: Strength & Muscle Tone: within normal limits Gait & Station: normal Patient leans: N/A  Psychiatric Specialty Exam: Review of Systems  Psychiatric/Behavioral:  Negative for decreased concentration, dysphoric mood, hallucinations, self-injury, sleep disturbance and suicidal ideas. The patient is nervous/anxious. The patient is not hyperactive.     There were no vitals taken for this visit.There is no height or weight on file to calculate BMI.  General Appearance: Casual  Eye Contact:  Good  Speech:  Clear and Coherent and Normal Rate  Volume:  Normal  Mood:  Anxious and Depressed  Affect:  Appropriate  Thought Process:  Coherent, Goal Directed, and Descriptions of Associations: Intact  Orientation:  Full (Time, Place, and Person)  Thought Content: WDL   Suicidal Thoughts:  No  Homicidal Thoughts:  No  Memory:  Immediate;   Good Recent;   Good Remote;   Good  Judgement:  Good  Insight:  Good  Psychomotor Activity:  Normal   Concentration:  Concentration: Good and Attention Span: Good  Recall:  Good  Fund of Knowledge: Good  Language: Good  Akathisia:  No  Handed:  Right  AIMS (if indicated): not done  Assets:  Communication Skills Desire for Improvement Housing Social Support  ADL's:  Intact  Cognition: WNL  Sleep:  Good   Screenings: GAD-7    Flowsheet Row Video Visit from 04/14/2023 in [facility] Office Visit from 02/26/2023 in [facility]  Total GAD-7 Score 6 19      PHQ2-9    Flowsheet Row Video Visit from 04/14/2023 in [facility] Counselor from 03/04/2023 in [facility] Office Visit from 02/26/2023 in [facility]  ED from 02/24/2023 in [facility] Nutrition from 04/05/2019 in [facility] Total Score 2 6 6  0 0  PHQ-9 Total Score 7 25 23  -- --      Flowsheet Row Video Visit from 04/14/2023 in [facility] Counselor from 03/04/2023 in [facility] Office Visit from 02/26/2023 in [facility]  C-SSRS RISK CATEGORY No Risk No Risk No Risk        Assessment and Plan:   ***  Collaboration of Care: Collaboration of Care: Medication Management AEB Provider managing patient's psychiatric medications, Primary Care Provider AEB patient being followed by a primary care provider, Psychiatrist AEB patient being followed by mental health provider at this facility, Other provider involved in patient's care AEB patient being followed by gastroenterology, and Referral or follow-up with counselor/therapist AEB patient being seen by licensed clinical social worker at this facility  Patient/Guardian was advised Release of Information must be obtained prior to any record release in order to  collaborate their care with an outside provider. Patient/Guardian was advised if they have not already done so to contact the registration department to sign all necessary forms in order for [place] to release information regarding their care.   Consent: Patient/Guardian gives verbal consent for treatment and assignment of benefits for services provided during this visit. Patient/Guardian expressed understanding and agreed to proceed.   1. Bipolar 2 disorder (HCC)  - cariprazine (VRAYLAR) 1.5 MG capsule; Take 1 capsule (1.5 mg total) by mouth daily.  Dispense: 30 capsule; Refill: 1 - escitalopram (LEXAPRO) 10 MG tablet; Take 1 tablet (10 mg total) by mouth daily.  Dispense: 30 tablet; Refill: 1 - gabapentin (NEURONTIN) 400 MG capsule; Take 1 capsule (400 mg total) by mouth 3 (three) times daily.  Dispense: 60 capsule; Refill: 1  2. Generalized anxiety disorder  - escitalopram (LEXAPRO) 10 MG tablet; Take 1 tablet (10 mg total) by mouth daily.  Dispense: 30 tablet; Refill: 1 - gabapentin (NEURONTIN) 400 MG capsule; Take 1 capsule (400 mg total) by mouth 3 (three) times daily.  Dispense: 60 capsule; Refill: 1  Patient to follow up in 6 weeks Provider spent a total of 25 minutes with the patient/reviewing patient's chart  Meta Hatchet, PA 04/14/2023, 3:22 PM

## 2023-04-14 NOTE — Progress Notes (Signed)
Because of both back and belly pain along with your UTI symptoms and need to rule out a more complicated UTI, I feel your condition warrants further evaluation and I recommend that you be seen in a face to face visit.   NOTE: There will be NO CHARGE for this eVisit   If you are having a true medical emergency please call 911.      For an urgent face to face visit, [place] has eight urgent care centers for your convenience:   NEW!! [facility] Health Urgent Care Center at [facility] Get Driving Directions [phone] [address], [address] [place], [address]    [facility] Health Urgent Care Center at [facility] Get Driving Directions [phone] [address] [place], [place] [address]   [facility] Health Urgent Care Center [facility]) Get Driving Directions [phone] [address] [place], [place] [address]  [facility] Health Urgent Care Center [facility]) Get Driving Directions [phone] [address] [place],  [place]  [address]  [facility] Health Urgent Care Center [facility] at [employer]  [phone] [address] [place],  [place] [address]   [facility] Health Urgent Care at [facility] Get Driving Directions [phone] [address], [address] [place], [place] [address]   [facility] Health Urgent Care at [facility] Get Driving Directions  [phone] [address] [place], [place] [address]   [facility] Health Urgent Care at [facility] Directions [phone] [address], [address] [place], [place] [address]  Your MyChart E-visit questionnaire answers were reviewed by a board certified advanced clinical practitioner to complete your personal care plan based on your specific symptoms.  Thank you for using e-Visits.

## 2023-04-15 ENCOUNTER — Other Ambulatory Visit (HOSPITAL_COMMUNITY): Payer: Self-pay

## 2023-04-16 ENCOUNTER — Other Ambulatory Visit (HOSPITAL_COMMUNITY): Payer: Self-pay

## 2023-04-17 ENCOUNTER — Other Ambulatory Visit (HOSPITAL_COMMUNITY): Payer: Self-pay

## 2023-04-18 ENCOUNTER — Ambulatory Visit: Payer: Medicaid Other | Admitting: Physical Therapy

## 2023-04-22 ENCOUNTER — Other Ambulatory Visit: Payer: Self-pay

## 2023-04-22 ENCOUNTER — Other Ambulatory Visit (HOSPITAL_COMMUNITY): Payer: Self-pay

## 2023-04-22 MED FILL — Famotidine Tab 40 MG: ORAL | 30 days supply | Qty: 60 | Fill #0 | Status: AC

## 2023-04-23 ENCOUNTER — Other Ambulatory Visit (HOSPITAL_COMMUNITY): Payer: Self-pay

## 2023-04-23 ENCOUNTER — Telehealth: Payer: Self-pay

## 2023-04-23 ENCOUNTER — Other Ambulatory Visit: Payer: Self-pay

## 2023-04-23 DIAGNOSIS — M542 Cervicalgia: Secondary | ICD-10-CM

## 2023-04-23 NOTE — Telephone Encounter (Signed)
Patient aware this has been ordered

## 2023-04-23 NOTE — Telephone Encounter (Signed)
Patient called triage phone asking to proceed with scheduling MRI. States her pain is getting much worse and that she woke up this morning with severe right UE swelling and numbness. Please call to advise [phone]

## 2023-04-25 ENCOUNTER — Other Ambulatory Visit: Payer: Self-pay | Admitting: Internal Medicine

## 2023-04-27 ENCOUNTER — Other Ambulatory Visit (HOSPITAL_COMMUNITY): Payer: Self-pay

## 2023-04-27 MED ORDER — PROMETHAZINE HCL 25 MG PO TABS
25.0000 mg | ORAL_TABLET | Freq: Four times a day (QID) | ORAL | 1 refills | Status: DC | PRN
  Filled 2023-04-27 – 2023-05-05 (×3): qty 120, 30d supply, fill #0
  Filled 2023-05-28: qty 120, 30d supply, fill #1

## 2023-04-28 ENCOUNTER — Other Ambulatory Visit (HOSPITAL_COMMUNITY): Payer: Self-pay

## 2023-04-30 ENCOUNTER — Other Ambulatory Visit (HOSPITAL_COMMUNITY): Payer: Self-pay

## 2023-05-01 ENCOUNTER — Encounter: Payer: Self-pay | Admitting: Orthopaedic Surgery

## 2023-05-01 ENCOUNTER — Encounter: Payer: Self-pay | Admitting: Hematology and Oncology

## 2023-05-01 ENCOUNTER — Ambulatory Visit: Payer: Medicaid Other

## 2023-05-04 ENCOUNTER — Other Ambulatory Visit: Payer: Medicaid Other

## 2023-05-05 ENCOUNTER — Other Ambulatory Visit (HOSPITAL_COMMUNITY): Payer: Self-pay

## 2023-05-05 ENCOUNTER — Other Ambulatory Visit: Payer: Self-pay

## 2023-05-06 ENCOUNTER — Other Ambulatory Visit (HOSPITAL_COMMUNITY): Payer: Self-pay

## 2023-05-06 ENCOUNTER — Other Ambulatory Visit: Payer: Self-pay

## 2023-05-06 ENCOUNTER — Ambulatory Visit: Payer: Medicaid Other | Admitting: Orthopaedic Surgery

## 2023-05-08 ENCOUNTER — Encounter: Payer: Self-pay | Admitting: Orthopaedic Surgery

## 2023-05-08 ENCOUNTER — Other Ambulatory Visit (INDEPENDENT_AMBULATORY_CARE_PROVIDER_SITE_OTHER): Payer: Medicaid Other

## 2023-05-08 DIAGNOSIS — K7460 Unspecified cirrhosis of liver: Secondary | ICD-10-CM | POA: Diagnosis not present

## 2023-05-08 LAB — COMPREHENSIVE METABOLIC PANEL
ALT: 8 U/L (ref 0–35)
AST: 11 U/L (ref 0–37)
Albumin: 3.9 g/dL (ref 3.5–5.2)
Alkaline Phosphatase: 66 U/L (ref 39–117)
BUN: 12 mg/dL (ref 6–23)
CO2: 26 meq/L (ref 19–32)
Calcium: 8.4 mg/dL (ref 8.4–10.5)
Chloride: 112 meq/L (ref 96–112)
Creatinine, Ser: 1.02 mg/dL (ref 0.40–1.20)
GFR: 73.83 mL/min (ref >60.00)
Glucose, Bld: 83 mg/dL (ref 70–99)
Potassium: 4.4 meq/L (ref 3.5–5.1)
Sodium: 143 meq/L (ref 135–145)
Total Bilirubin: 0.3 mg/dL (ref 0.2–1.2)
Total Protein: 5.7 g/dL — ABNORMAL LOW (ref 6.0–8.3)

## 2023-05-08 LAB — CBC
HCT: 37.4 % (ref 36.0–46.0)
Hemoglobin: 12.1 g/dL (ref 12.0–15.0)
MCHC: 32.4 g/dL (ref 30.0–36.0)
MCV: 93.2 fL (ref 78.0–100.0)
Platelets: 57 10*3/uL — ABNORMAL LOW (ref 150.0–400.0)
RBC: 4.02 Mil/uL (ref 3.87–5.11)
RDW: 16.2 % — ABNORMAL HIGH (ref 11.5–15.5)
WBC: 2.5 10*3/uL — ABNORMAL LOW (ref 4.0–10.5)

## 2023-05-08 LAB — PROTIME-INR
INR: 1.3 {ratio} — ABNORMAL HIGH (ref 0.8–1.0)
Prothrombin Time: 13.7 s — ABNORMAL HIGH (ref 9.6–13.1)

## 2023-05-18 NOTE — Therapy (Incomplete)
OUTPATIENT PHYSICAL THERAPY CERVICAL EVALUATION   Patient Name: Linda Becker MRN: [id number] DOB:11-20-1990, 32 y.o., female Today's Date: 05/18/2023  END OF SESSION:   Past Medical History:  Diagnosis Date   Allergic rhinitis    remote hx of allergy shots   Asthma    since childhood  on controller meds extrinsic dr Sharyn Lull   Bipolar II disorder (HCC)    Cirrhosis (HCC)    Erosive esophagitis    Esophageal varices (HCC)    Family history of adverse reaction to anesthesia    mom and sister difficult to awaken per patient    Gallbladder sludge    Gastropathy    GERD (gastroesophageal reflux disease)    on nexium  for  long term sx since childhood   H/O miscarriage, not currently pregnant    [redacted] weeks  march 2016   Hepatic steatosis    Hiatal hernia    Hypothyroidism    Migraine    Murmur    pt reports MVP   Portal hypertension (HCC)    Sessile colonic polyp    Splenomegaly    Steatohepatitis    Syncope    under eval ? cause   Tachycardia    episodes with near syncope eval dr Nadara Eaton  on no meds dced LABA   Thrombocytopenia (HCC)    Upper GI bleed    Past Surgical History:  Procedure Laterality Date   BIOPSY  09/03/2018   Procedure: BIOPSY;  Surgeon: Beverley Fiedler, MD;  Location: [facility];  Service: Gastroenterology;;   BIOPSY  09/26/2020   Procedure: BIOPSY;  Surgeon: Beverley Fiedler, MD;  Location: [facility];  Service: Gastroenterology;;   BIOPSY  05/12/2022   Procedure: BIOPSY;  Surgeon: Beverley Fiedler, MD;  Location: [facility];  Service: Gastroenterology;;   broken right femur  2008   rod placed   COLONOSCOPY     COLONOSCOPY WITH ESOPHAGOGASTRODUODENOSCOPY (EGD)  03/24/2023   Erick Blinks at [facility]   COLONOSCOPY WITH PROPOFOL N/A 09/03/2018   Procedure: COLONOSCOPY WITH PROPOFOL;  Surgeon: Beverley Fiedler, MD;  Location: [facility];  Service: Gastroenterology;  Laterality: N/A;   DILATION AND CURETTAGE OF UTERUS     ESOPHAGOGASTRODUODENOSCOPY (EGD)  WITH PROPOFOL N/A 09/03/2018   Procedure: ESOPHAGOGASTRODUODENOSCOPY (EGD) WITH PROPOFOL;  Surgeon: Beverley Fiedler, MD;  Location: [facility];  Service: Gastroenterology;  Laterality: N/A;   ESOPHAGOGASTRODUODENOSCOPY (EGD) WITH PROPOFOL N/A 01/27/2020   Procedure: ESOPHAGOGASTRODUODENOSCOPY (EGD) WITH PROPOFOL;  Surgeon: Meridee Score Netty Starring., MD;  Location: [facility];  Service: Gastroenterology;  Laterality: N/A;   ESOPHAGOGASTRODUODENOSCOPY (EGD) WITH PROPOFOL N/A 03/12/2020   Procedure: ESOPHAGOGASTRODUODENOSCOPY (EGD) WITH PROPOFOL;  Surgeon: Beverley Fiedler, MD;  Location: [facility];  Service: Gastroenterology;  Laterality: N/A;   ESOPHAGOGASTRODUODENOSCOPY (EGD) WITH PROPOFOL N/A 09/26/2020   Procedure: ESOPHAGOGASTRODUODENOSCOPY (EGD) WITH PROPOFOL;  Surgeon: Beverley Fiedler, MD;  Location: [facility];  Service: Gastroenterology;  Laterality: N/A;   ESOPHAGOGASTRODUODENOSCOPY (EGD) WITH PROPOFOL N/A 04/29/2021   Procedure: ESOPHAGOGASTRODUODENOSCOPY (EGD) WITH PROPOFOL;  Surgeon: Beverley Fiedler, MD;  Location: [facility];  Service: Gastroenterology;  Laterality: N/A;   ESOPHAGOGASTRODUODENOSCOPY (EGD) WITH PROPOFOL N/A 05/12/2022   Procedure: ESOPHAGOGASTRODUODENOSCOPY (EGD) WITH PROPOFOL;  Surgeon: Beverley Fiedler, MD;  Location: [facility];  Service: Gastroenterology;  Laterality: N/A;   GASTRIC VARICES BANDING  01/27/2020   Procedure: GASTRIC VARICES BANDING;  Surgeon: Meridee Score Netty Starring., MD;  Location: [facility];  Service: Gastroenterology;;   HOT HEMOSTASIS N/A 04/29/2021   Procedure: HOT  HEMOSTASIS (ARGON PLASMA COAGULATION/BICAP);  Surgeon: Beverley Fiedler, MD;  Location: [facility];  Service: Gastroenterology;  Laterality: N/A;   IR TRANSCATHETER BX  01/19/2019   IR US GUIDE VASC ACCESS RIGHT  01/19/2019   IR VENOGRAM HEPATIC W HEMODYNAMIC EVALUATION  01/19/2019   LAPAROSCOPIC GASTRIC SLEEVE RESECTION N/A 06/28/2019   Procedure: LAPAROSCOPIC GASTRIC SLEEVE  RESECTION, Upper Endo, ERAS Pathway;  Surgeon: Berna Bue, MD;  Location: [facility];  Service: General;  Laterality: N/A;   OB ultrasound N/A 12/01/2017   see report   POLYPECTOMY  09/03/2018   Procedure: POLYPECTOMY;  Surgeon: Beverley Fiedler, MD;  Location: [facility];  Service: Gastroenterology;;   TONSILLECTOMY  2006   UPPER GASTROINTESTINAL ENDOSCOPY     WISDOM TOOTH EXTRACTION     Patient Active Problem List   Diagnosis Date Noted   Bipolar 2 disorder (HCC) 02/26/2023   Generalized anxiety disorder 02/26/2023   PTSD (post-traumatic stress disorder) 02/26/2023   Eosinophilic esophagitis 05/14/2022   IDA (iron deficiency anemia) 05/02/2021   Anemia    Upper abdominal pain    Gastritis and gastroduodenitis    NASH (nonalcoholic steatohepatitis) 08/23/2020   Generalized abdominal pain 08/23/2020   Chronic nausea 08/23/2020   Esophageal varices without bleeding (HCC)    H/O gastric bypass 02/26/2020   Idiopathic esophageal varices with bleeding (HCC)    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   Portal hypertension (HCC)    Thrombocytopenia (HCC) 08/12/2018   Spontaneous vaginal delivery 12/22/2017   IUFD at 20 weeks or more of gestation 12/20/2017   Snoring 11/10/2017   Morbid obesity (HCC) 05/19/2017   Adult hypothyroidism 05/19/2017   BMI 40.0-44.9, adult (HCC) 11/09/2015   GERD (gastroesophageal reflux disease)    Tachycardia    Syncope    Allergic rhinitis    Persistent asthma with undetermined severity    Migraine with aura 09/26/2011    PCP: Creola Corn, MD   REFERRING PROVIDER: Kathryne Hitch, MD   REFERRING DIAG: M54.2 (ICD-10-CM) - Cervicalgia   THERAPY DIAG:  No diagnosis found.  Rationale for Evaluation and Treatment: Rehabilitation  ONSET DATE: ***  SUBJECTIVE:                                                                                                                                                                                                          SUBJECTIVE STATEMENT: ***  Hand dominance: {MISC; OT HAND DOMINANCE:[id number]}  PERTINENT HISTORY:  ***  PAIN:  Are you having pain? Yes: NPRS scale: ***/10 Pain location: ***  Pain description: *** Aggravating factors: *** Relieving factors: ***  PRECAUTIONS: {Therapy precautions:24002}  RED FLAGS: {PT Red Flags:29287}     WEIGHT BEARING RESTRICTIONS: {Yes ***/No:24003}  FALLS:  Has patient fallen in last 6 months? {fallsyesno:27318}  LIVING ENVIRONMENT: Lives with: {[facility] lives with:25569::"lives with their family"} Lives in: {Lives in:25570} Stairs: {opstairs:27293} Has following equipment at home: {Assistive devices:23999}  OCCUPATION: ***  PLOF: {PLOF:24004}  PATIENT GOALS: ***  NEXT MD VISIT: ***  OBJECTIVE:  Note: Objective measures were completed at Evaluation unless otherwise noted.  DIAGNOSTIC FINDINGS:  X-rays of her cervical spine show loss of cervical lordosis.   PATIENT SURVEYS:  {rehab surveys:24030}  COGNITION: Overall cognitive status: {cognition:24006}  SENSATION: {sensation:27233}  POSTURE: {posture:25561}  PALPATION: ***   CERVICAL ROM:   {AROM/PROM:27142} ROM A/PROM (deg) eval  Flexion   Extension   Right lateral flexion   Left lateral flexion   Right rotation   Left rotation    (Blank rows = not tested)  UPPER EXTREMITY ROM:  {AROM/PROM:27142} ROM Right eval Left eval  Shoulder flexion    Shoulder extension    Shoulder abduction    Shoulder adduction    Shoulder extension    Shoulder internal rotation    Shoulder external rotation    Elbow flexion    Elbow extension    Wrist flexion    Wrist extension    Wrist ulnar deviation    Wrist radial deviation    Wrist pronation    Wrist supination     (Blank rows = not tested)  UPPER EXTREMITY MMT:  MMT Right eval Left eval  Shoulder flexion    Shoulder extension    Shoulder abduction    Shoulder adduction     Shoulder extension    Shoulder internal rotation    Shoulder external rotation    Middle trapezius    Lower trapezius    Elbow flexion    Elbow extension    Wrist flexion    Wrist extension    Wrist ulnar deviation    Wrist radial deviation    Wrist pronation    Wrist supination    Grip strength     (Blank rows = not tested)  CERVICAL SPECIAL TESTS:  {Cervical special tests:25246}  FUNCTIONAL TESTS:  {Functional tests:24029}  TODAY'S TREATMENT:                                                                                                                              [facility] Adult PT Treatment:                                                DATE: 05/19/23 Therapeutic Exercise: *** Manual Therapy: *** Neuromuscular re-ed: *** Therapeutic Activity: *** Modalities: *** Self Care: ***    PATIENT EDUCATION:  Education details: *** Person  educated: {Person educated:25204} Education method: {Education Method:25205} Education comprehension: {Education Comprehension:25206}  HOME EXERCISE PROGRAM: ***  ASSESSMENT:  CLINICAL IMPRESSION: Patient is a 32 y.o. female who was seen today for physical therapy evaluation and treatment for M54.2 (ICD-10-CM) - Cervicalgia .   OBJECTIVE IMPAIRMENTS: {opptimpairments:25111}.   ACTIVITY LIMITATIONS: {activitylimitations:27494}  PARTICIPATION LIMITATIONS: {participationrestrictions:25113}  PERSONAL FACTORS: {Personal factors:25162} are also affecting patient's functional outcome.   REHAB POTENTIAL: {rehabpotential:25112}  CLINICAL DECISION MAKING: {clinical decision making:25114}  EVALUATION COMPLEXITY: {Evaluation complexity:25115}   GOALS:   SHORT TERM GOALS: Target date: ***  *** Baseline:  Goal status: INITIAL  2.  *** Baseline:  Goal status: INITIAL  3.  *** Baseline:  Goal status: INITIAL  4.  *** Baseline:  Goal status: INITIAL  5.  *** Baseline:  Goal status: INITIAL  6.  *** Baseline:  Goal  status: INITIAL  LONG TERM GOALS: Target date: ***  *** Baseline:  Goal status: INITIAL  2.  *** Baseline:  Goal status: INITIAL  3.  *** Baseline:  Goal status: INITIAL  4.  *** Baseline:  Goal status: INITIAL  5.  *** Baseline:  Goal status: INITIAL  6.  *** Baseline:  Goal status: INITIAL   PLAN:  PT FREQUENCY: {rehab frequency:25116}  PT DURATION: {rehab duration:25117}  PLANNED INTERVENTIONS: {rehab planned interventions:25118::"97110-Therapeutic exercises","97530- Therapeutic [phone]- Neuromuscular re-education","97535- Self MWUX","32440- Manual therapy"}  PLAN FOR NEXT SESSION: ***   Joellyn Rued, PT 05/18/2023, 3:58 PM

## 2023-05-19 ENCOUNTER — Ambulatory Visit: Payer: Medicaid Other

## 2023-05-20 ENCOUNTER — Other Ambulatory Visit (HOSPITAL_COMMUNITY): Payer: Self-pay

## 2023-05-20 ENCOUNTER — Telehealth (INDEPENDENT_AMBULATORY_CARE_PROVIDER_SITE_OTHER): Payer: Medicaid Other | Admitting: Physician Assistant

## 2023-05-20 ENCOUNTER — Encounter (HOSPITAL_COMMUNITY): Payer: Self-pay | Admitting: Physician Assistant

## 2023-05-20 DIAGNOSIS — F411 Generalized anxiety disorder: Secondary | ICD-10-CM | POA: Diagnosis not present

## 2023-05-20 DIAGNOSIS — F3181 Bipolar II disorder: Secondary | ICD-10-CM | POA: Diagnosis not present

## 2023-05-20 MED ORDER — ESCITALOPRAM OXALATE 10 MG PO TABS
10.0000 mg | ORAL_TABLET | Freq: Every day | ORAL | 1 refills | Status: DC
  Filled 2023-05-20: qty 30, 30d supply, fill #0

## 2023-05-20 MED ORDER — CARIPRAZINE HCL 3 MG PO CAPS
3.0000 mg | ORAL_CAPSULE | Freq: Every day | ORAL | 1 refills | Status: DC
  Filled 2023-05-20: qty 30, 30d supply, fill #0

## 2023-05-20 MED ORDER — GABAPENTIN 400 MG PO CAPS
400.0000 mg | ORAL_CAPSULE | Freq: Three times a day (TID) | ORAL | 1 refills | Status: DC
  Filled 2023-05-20: qty 60, 20d supply, fill #0

## 2023-05-20 NOTE — Progress Notes (Cosign Needed)
BH MD/PA/NP OP Progress Note  Virtual Visit via Video Note  I connected with Linda Becker on 05/20/23 at 11:00 AM EST by a video enabled telemedicine application and verified that I am speaking with the correct person using two identifiers.  Location: Patient: Home Provider: Clinic   I discussed the limitations of evaluation and management by telemedicine and the availability of in person appointments. The patient expressed understanding and agreed to proceed.  Follow Up Instructions:  I discussed the assessment and treatment plan with the patient. The patient was provided an opportunity to ask questions and all were answered. The patient agreed with the plan and demonstrated an understanding of the instructions.   The patient was advised to call back or seek an in-person evaluation if the symptoms worsen or if the condition fails to improve as anticipated.  I provided 16 minutes of non-face-to-face time during this encounter.  Meta Hatchet, PA    05/20/2023 12:33 PM Linda Becker  MRN:  [id number]  Chief Complaint:  Chief Complaint  Patient presents with   Follow-up   Medication Management   HPI:   Linda Becker is a 32 year old female with a past psychiatric history significant for bipolar 2 disorder and generalized anxiety disorder who presents to [facility] via virtual video visit for follow-up and medication management.  Patient is currently being managed on the following psychiatric medications:  Escitalopram 10 mg daily Cariprazine (Vraylar) 1.5 mg daily Gabapentin 400 mg 3 times daily  Patient reports that her medications have been okay and that she feels stable.  She continues to endorse depression but states that her episodes are not overwhelming or all-consuming.  Patient rates her depression as 5 out of 10 with 10 being most severe.  Patient endorses depressive episodes 4 days out of the week.  Patient  endorses the following depressive symptoms: lack of motivation, and difficulty getting out of bed.  Patient reports that she never leaves her bed and treats her bed as a sort of safe space.  Lately, patient reports that she does not feel like a good counterpart to her family or friends.  She reports that she feels she gives her family and friends a discounted version of herself.  Patient reports that she will be seeing therapy at this facility on the 10th of this month.  She reports that she currently feels isolated and has been this way over the last few months.  Patient endorses anxiety and states that her anxiety has been relatively the same.  She reports that she has occasional moments where she freaks out.  Although patient endorses anxiety, she reports that for the first time in her life, she has been able to solve or work through her problems.  Patient rates her anxiety at 4 or 5 out of 10.  She reports that her anxiety and depression tend to go hand-in-hand.  Patient denies any other issues or concerns at this time.  A PHQ-9 screen was performed with the patient scoring a 10.  A GAD-7 screen was also performed with the patient scoring a 12.  Patient is alert and oriented x 4, calm, cooperative, and fully engaged in conversation during the encounter.  Patient describes her mood as being a little blue but states that she is laid back currently.  Patient denies suicidal or homicidal ideations.  She further denies auditory or visual hallucinations and does not appear to be responding to internal/external stimuli.  Patient endorses  fair sleep and receives on average 6 to 7 hours of sleep per night.  Patient endorses good appetite stating that he mainly snacks but generally has 3 meals per day.  Patient denies alcohol consumption, tobacco use, or illicit drug use.  Visit Diagnosis:    ICD-10-CM   1. Bipolar 2 disorder (HCC)  F31.81 escitalopram (LEXAPRO) 10 MG tablet    cariprazine (VRAYLAR) 3 MG  capsule    gabapentin (NEURONTIN) 400 MG capsule    2. Generalized anxiety disorder  F41.1 escitalopram (LEXAPRO) 10 MG tablet    gabapentin (NEURONTIN) 400 MG capsule       Past Psychiatric History:  Patient endorses a past psychiatric history significant for bipolar 2 disorder, OCD, anxiety, and PTSD   Patient denies a past history of hospitalization due to mental health.  She does report that she was recently voluntarily admitted overnight at [facility] in the past (02/24/2023)   Patient denies a past history of suicide attempts   Patient denies a past history of homicide attempts  Past Medical History:  Past Medical History:  Diagnosis Date   Allergic rhinitis    remote hx of allergy shots   Asthma    since childhood  on controller meds extrinsic dr Sharyn Lull   Bipolar II disorder (HCC)    Cirrhosis (HCC)    Erosive esophagitis    Esophageal varices (HCC)    Family history of adverse reaction to anesthesia    mom and sister difficult to awaken per patient    Gallbladder sludge    Gastropathy    GERD (gastroesophageal reflux disease)    on nexium  for  long term sx since childhood   H/O miscarriage, not currently pregnant    [redacted] weeks  march 2016   Hepatic steatosis    Hiatal hernia    Hypothyroidism    Migraine    Murmur    pt reports MVP   Portal hypertension (HCC)    Sessile colonic polyp    Splenomegaly    Steatohepatitis    Syncope    under eval ? cause   Tachycardia    episodes with near syncope eval dr Nadara Eaton  on no meds dced LABA   Thrombocytopenia (HCC)    Upper GI bleed     Past Surgical History:  Procedure Laterality Date   BIOPSY  09/03/2018   Procedure: BIOPSY;  Surgeon: Beverley Fiedler, MD;  Location: [facility];  Service: Gastroenterology;;   BIOPSY  09/26/2020   Procedure: BIOPSY;  Surgeon: Beverley Fiedler, MD;  Location: [facility];  Service: Gastroenterology;;   BIOPSY  05/12/2022   Procedure: BIOPSY;  Surgeon: Beverley Fiedler, MD;  Location: [facility]  END[facility];  Service: Gastroenterology;;   broken right femur  2008   rod placed   COLONOSCOPY     COLONOSCOPY WITH ESOPHAGOGASTRODUODENOSCOPY (EGD)  03/24/2023   Erick Blinks at [facility]   COLONOSCOPY WITH PROPOFOL N/A 09/03/2018   Procedure: COLONOSCOPY WITH PROPOFOL;  Surgeon: Beverley Fiedler, MD;  Location: [facility];  Service: Gastroenterology;  Laterality: N/A;   DILATION AND CURETTAGE OF UTERUS     ESOPHAGOGASTRODUODENOSCOPY (EGD) WITH PROPOFOL N/A 09/03/2018   Procedure: ESOPHAGOGASTRODUODENOSCOPY (EGD) WITH PROPOFOL;  Surgeon: Beverley Fiedler, MD;  Location: [facility];  Service: Gastroenterology;  Laterality: N/A;   ESOPHAGOGASTRODUODENOSCOPY (EGD) WITH PROPOFOL N/A 01/27/2020   Procedure: ESOPHAGOGASTRODUODENOSCOPY (EGD) WITH PROPOFOL;  Surgeon: Meridee Score Netty Starring., MD;  Location: [facility] END[facility]COPY;  Service: Gastroenterology;  Laterality: N/A;   ESOPHAGOGASTRODUODENOSCOPY (EGD)  WITH PROPOFOL N/A 03/12/2020   Procedure: ESOPHAGOGASTRODUODENOSCOPY (EGD) WITH PROPOFOL;  Surgeon: Beverley Fiedler, MD;  Location: [facility];  Service: Gastroenterology;  Laterality: N/A;   ESOPHAGOGASTRODUODENOSCOPY (EGD) WITH PROPOFOL N/A 09/26/2020   Procedure: ESOPHAGOGASTRODUODENOSCOPY (EGD) WITH PROPOFOL;  Surgeon: Beverley Fiedler, MD;  Location: [facility];  Service: Gastroenterology;  Laterality: N/A;   ESOPHAGOGASTRODUODENOSCOPY (EGD) WITH PROPOFOL N/A 04/29/2021   Procedure: ESOPHAGOGASTRODUODENOSCOPY (EGD) WITH PROPOFOL;  Surgeon: Beverley Fiedler, MD;  Location: [facility];  Service: Gastroenterology;  Laterality: N/A;   ESOPHAGOGASTRODUODENOSCOPY (EGD) WITH PROPOFOL N/A 05/12/2022   Procedure: ESOPHAGOGASTRODUODENOSCOPY (EGD) WITH PROPOFOL;  Surgeon: Beverley Fiedler, MD;  Location: [facility];  Service: Gastroenterology;  Laterality: N/A;   GASTRIC VARICES BANDING  01/27/2020   Procedure: GASTRIC VARICES BANDING;  Surgeon: Meridee Score Netty Starring., MD;  Location: [facility] ENDOSCOPY;  Service:  Gastroenterology;;   HOT HEMOSTASIS N/A 04/29/2021   Procedure: HOT HEMOSTASIS (ARGON PLASMA COAGULATION/BICAP);  Surgeon: Beverley Fiedler, MD;  Location: [facility];  Service: Gastroenterology;  Laterality: N/A;   IR TRANSCATHETER BX  01/19/2019   IR US GUIDE VASC ACCESS RIGHT  01/19/2019   IR VENOGRAM HEPATIC W HEMODYNAMIC EVALUATION  01/19/2019   LAPAROSCOPIC GASTRIC SLEEVE RESECTION N/A 06/28/2019   Procedure: LAPAROSCOPIC GASTRIC SLEEVE RESECTION, Upper Endo, ERAS Pathway;  Surgeon: Berna Bue, MD;  Location: [facility];  Service: General;  Laterality: N/A;   OB ultrasound N/A 12/01/2017   see report   POLYPECTOMY  09/03/2018   Procedure: POLYPECTOMY;  Surgeon: Beverley Fiedler, MD;  Location: [facility];  Service: Gastroenterology;;   TONSILLECTOMY  2006   UPPER GASTROINTESTINAL ENDOSCOPY     WISDOM TOOTH EXTRACTION      Family Psychiatric History:  Father - depression Sister - bipolar disorder, depression Nephew - Autistic with severe ADHD   Family history of suicide attempt: Patient denies Family history of homicide attempt: Patient denies Family history of substance abuse: Patient denies  Family History:  Family History  Problem Relation Age of Onset   Liver disease Mother 76       advanced non-alcoholic cirrhosis   Arthritis Mother    Hyperlipidemia Mother    Heart disease Mother    Hypertension Mother    Diabetes Mother    Kidney disease Mother    Bleeding Disorder Mother    Colon polyps Mother    Asthma Father    Arthritis Father    Hyperlipidemia Father    Heart disease Father    Hypertension Father    Diabetes Father    Colon polyps Father    Breast cancer Maternal Aunt    Breast cancer Paternal Aunt    Esophageal cancer Paternal Aunt    Arthritis Maternal Grandmother    Breast cancer Maternal Grandmother    Thyroid disease Maternal Grandmother    Diabetes Maternal Grandfather    Pancreatic cancer Maternal Grandfather    Breast cancer Paternal  Grandmother    Diabetes Paternal Grandmother    Colon cancer Paternal Grandfather    Diabetes Paternal Grandfather    Liver cancer Paternal Grandfather    Stomach cancer Paternal Grandfather     Social History:  Social History   Socioeconomic History   Marital status: Married    Spouse name: Not on file   Number of children: 1   Years of education: Not on file   Highest education level: Not on file  Occupational History   Not on file  Tobacco Use   Smoking status: Never  Smokeless tobacco: Never  Vaping Use   Vaping status: Never Used  Substance and Sexual Activity   Alcohol use: No    Alcohol/week: 0.0 standard drinks of alcohol   Drug use: No   Sexual activity: Yes    Birth control/protection: None  Other Topics Concern   Not on file  Social History Narrative   6-7 hours of sleep per night   Works full time   Lives with her parents sis and  Infant nephew   [employer]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   Divorced   8 week preg loss 09/29/2022     Son passed away June 22, 2018   Social Determinants of Health   Financial Resource Strain: Not on file  Food Insecurity: Food Insecurity Present (02/24/2023)   Hunger Vital Sign    Worried About Running Out of Food in the Last Year: Often true    Ran Out of Food in the Last Year: Often true  Transportation Needs: No Transportation Needs (02/24/2023)   PRAPARE - Administrator, Civil Service (Medical): No    Lack of Transportation (Non-Medical): No  Physical Activity: Not on file  Stress: Not on file  Social Connections: Not on file    Allergies:  Allergies  Allergen Reactions   Progesterone Rash and Other (See Comments)    Was in a form of birth control   Penicillins Rash and Other (See Comments)    Did it involve swelling of the face/tongue/throat, SOB, or low BP? No Did it involve sudden or severe rash/hives, skin peeling, or any reaction on the inside of your mouth or nose? Yes Did you need  to seek medical attention at a hospital or doctor's office? No When did it last happen?      9 + months If all above answers are "NO", may proceed with cephalosporin use.      Metabolic Disorder Labs: Lab Results  Component Value Date   HGBA1C 4.4 (L) 02/24/2023   MPG 80 02/24/2023   No results found for: "PROLACTIN" Lab Results  Component Value Date   CHOL 221 (H) 02/24/2023   TRIG 103 02/24/2023   HDL 49 02/24/2023   CHOLHDL 4.5 02/24/2023   VLDL 21 02/24/2023   LDLCALC 151 (H) 02/24/2023   LDLCALC 122 (H) 03/11/2019   Lab Results  Component Value Date   TSH 0.809 02/24/2023   TSH 3.05 08/27/2022    Therapeutic Level Labs: No results found for: "LITHIUM" No results found for: "VALPROATE" No results found for: "CBMZ"  Current Medications: Current Outpatient Medications  Medication Sig Dispense Refill   acetaminophen-codeine (TYLENOL #3) 300-30 MG tablet Take 1 tablet by mouth every 6 (six) hours. 12 tablet 0   albuterol (PROAIR HFA) 108 (90 Base) MCG/ACT inhaler Inhale 2 puffs into the lungs every 4-6 hours as needed. (Patient taking differently: Inhale 2 puffs into the lungs every 4 (four) hours as needed for wheezing or shortness of breath.) 18 g 0   albuterol (PROVENTIL) (2.5 MG/3ML) 0.083% nebulizer solution Inhale 1 ampule by nebulization every 4 (four) hours as needed for wheezing or shortness of breath. 90 mL 1   Ascorbic Acid (VITAMIN C) 1000 MG tablet Take 2,000 mg by mouth 2 (two) times daily.     budesonide (RHINOCORT AQUA) 32 MCG/ACT nasal spray Place 1 spray into both nostrils in the morning and at bedtime. (Patient taking differently: Place 1 spray into both nostrils 2 (  two) times daily as needed for rhinitis or allergies.) 8.43 mL 2   CALCIUM PO Take 1 tablet by mouth 2 (two) times daily.     cariprazine (VRAYLAR) 3 MG capsule Take 1 capsule (3 mg total) by mouth daily. 30 capsule 1   carvedilol (COREG) 12.5 MG tablet Take 1 tablet (12.5 mg total) by mouth  2 (two) times daily with a meal. 60 tablet 2   Cetirizine HCl 10 MG CAPS Take 10 mg by mouth daily.     diphenhydrAMINE (BENADRYL) 25 MG tablet Take 50 mg by mouth at bedtime as needed for allergies or sleep.     ergocalciferol (VITAMIN D2) 1.25 MG (50000 UT) capsule Take 1 capsule (50,000 Units total) by mouth once a week. (Patient taking differently: Take 50,000 Units by mouth every Monday.) 12 capsule 1   escitalopram (LEXAPRO) 10 MG tablet Take 1 tablet (10 mg total) by mouth daily. 30 tablet 1   famotidine (PEPCID) 40 MG tablet Take 1 tablet (40 mg total) by mouth 2 (two) times daily. Pt need an office visit for further refills. 60 tablet 0   fluconazole (DIFLUCAN) 150 MG tablet Take 1 tablet (150 mg total) by mouth every three (3) days as needed. (Patient taking differently: Take 150 mg by mouth every three (3) days as needed (For yeast infection).) 3 tablet 0   gabapentin (NEURONTIN) 400 MG capsule Take 1 capsule (400 mg total) by mouth 3 (three) times daily. 60 capsule 1   Na Sulfate-K Sulfate-Mg Sulf 17.5-3.13-1.6 GM/177ML SOLN Take as Directed 354 mL 0   naphazoline-pheniramine (EYE ALLERGY RELIEF) 0.025-0.3 % ophthalmic solution Place 2 drops into both eyes 4 (four) times daily as needed for allergies.     nitrofurantoin, macrocrystal-monohydrate, (MACROBID) 100 MG capsule Take 1 capsule (100 mg total) by mouth 2 (two) times daily. (Patient taking differently: Take 100 mg by mouth 2 (two) times daily. Take for 5 days starting on 02/21/23.) 10 capsule 0   predniSONE (DELTASONE) 50 MG tablet Take 1 tablet (50 mg total) by mouth daily with breakfast. 5 tablet 0   Prenatal Vit-Fe Fumarate-FA (MULTIVITAMIN-PRENATAL) 27-0.8 MG TABS tablet Take 1 tablet by mouth 2 (two) times daily.     promethazine (PHENERGAN) 25 MG suppository Unwrap and insert 1 suppository (25 mg total) rectally every 6 (six) hours as needed for nausea or vomiting. 30 each 1   promethazine (PHENERGAN) 25 MG tablet Take 1 tablet  (25 mg total) by mouth every 6 (six) hours as needed. 120 tablet 1   TURMERIC PO Take 1 capsule by mouth in the morning and at bedtime.     ursodiol (ACTIGALL) 500 MG tablet Take 1 tablet (500 mg total) by mouth 3 (three) times daily (Patient taking differently: Take 500 mg by mouth 3 (three) times daily as needed (For liver disorder).) 90 tablet 11   Vonoprazan Fumarate (VOQUEZNA) 20 MG TABS Take 1 tablet by mouth daily. 30 tablet 1   XIFAXAN 550 MG TABS tablet Take 1 tablet (550 mg total) by mouth 2 (two) times daily. 180 tablet 3   No current facility-administered medications for this visit.   Facility-Administered Medications Ordered in Other Visits  Medication Dose Route Frequency Provider Last Rate Last Admin   0.9 %  sodium chloride infusion   Intravenous Once PRN Pyrtle, Carie Caddy, MD       albuterol (VENTOLIN HFA) 108 (90 Base) MCG/ACT inhaler 2 puff  2 puff Inhalation Once PRN Pyrtle, Carie Caddy, MD  diphenhydrAMINE (BENADRYL) injection 50 mg  50 mg Intravenous Once PRN Pyrtle, Carie Caddy, MD       EPINEPHrine (EPI-PEN) injection 0.3 mg  0.3 mg Intramuscular Once PRN Pyrtle, Carie Caddy, MD       famotidine (PEPCID) IVPB 20 mg premix  20 mg Intravenous Once PRN Pyrtle, Carie Caddy, MD       iron sucrose (VENOFER) 200 mg in sodium chloride 0.9 % 100 mL IVPB  200 mg Intravenous Once Pyrtle, Carie Caddy, MD       methylPREDNISolone sodium succinate (SOLU-MEDROL) 125 mg/2 mL injection 125 mg  125 mg Intravenous Once PRN Pyrtle, Carie Caddy, MD       promethazine (PHENERGAN) suppository 12.5-25 mg  12.5-25 mg Rectal Q4H PRN Berna Bue, MD         Musculoskeletal: Strength & Muscle Tone: within normal limits Gait & Station: normal Patient leans: N/A  Psychiatric Specialty Exam: Review of Systems  Psychiatric/Behavioral:  Positive for dysphoric mood. Negative for decreased concentration, hallucinations, self-injury, sleep disturbance and suicidal ideas. The patient is nervous/anxious. The patient is not  hyperactive.     There were no vitals taken for this visit.There is no height or weight on file to calculate BMI.  General Appearance: Casual  Eye Contact:  Good  Speech:  Clear and Coherent and Normal Rate  Volume:  Normal  Mood:  Anxious and Depressed  Affect:  Appropriate  Thought Process:  Coherent, Goal Directed, and Descriptions of Associations: Intact  Orientation:  Full (Time, Place, and Person)  Thought Content: WDL   Suicidal Thoughts:  No  Homicidal Thoughts:  No  Memory:  Immediate;   Good Recent;   Good Remote;   Good  Judgement:  Good  Insight:  Good  Psychomotor Activity:  Normal  Concentration:  Concentration: Good and Attention Span: Good  Recall:  Good  Fund of Knowledge: Good  Language: Good  Akathisia:  No  Handed:  Right  AIMS (if indicated): not done  Assets:  Communication Skills Desire for Improvement Housing Social Support  ADL's:  Intact  Cognition: WNL  Sleep:  Good   Screenings: GAD-7    Flowsheet Row Video Visit from 05/20/2023 in [facility] Video Visit from 04/14/2023 in [facility] Office Visit from 02/26/2023 in [facility]  Total GAD-7 Score 12 6 19       PHQ2-9    Flowsheet Row Video Visit from 05/20/2023 in [facility] Video Visit from 04/14/2023 in [facility] Counselor from 03/04/2023 in [facility] Office Visit from 02/26/2023 in [facility] ED from 02/24/2023 in [facility]  PHQ-2 Total Score 4 2 6 6  0  PHQ-9 Total Score 10 7 25 23  --      Flowsheet Row Video Visit from 05/20/2023 in [facility] Video Visit from 04/14/2023 in [facility] Counselor from 03/04/2023 in [facility]  C-SSRS RISK CATEGORY No Risk No Risk No  Risk        Assessment and Plan:   Linda N. Alexiou is a 32 year old female with a past psychiatric history significant for bipolar 2 disorder and generalized anxiety disorder who presents to [facility] via virtual video visit for follow-up and medication management.  Patient presents to the encounter stating that her medications have been okay and  that she has been stable lately.  Despite her stability, patient continues to endorse episodes of depression characterized by having no motivation and difficulty getting out of bed.  She also endorses anxiety but states that she has been able to work through her anxiety as of late.  During the assessment, patient's PHQ-9 and GAD-7 screens were increased from the last encounter.  Patient had a PHQ-9 screen of 10 while her GAD-7 screen was 12.  Provider recommended increasing patient's Vraylar from 1.5 mg to 3 mg daily for mood stability.  Patient was agreeable to recommendation.  Patient to continue taking all other medications as prescribed.  Patient's medications to be e-prescribed to pharmacy of choice.  Collaboration of Care: Collaboration of Care: Medication Management AEB Provider managing patient's psychiatric medications, Primary Care Provider AEB patient being followed by a primary care provider, Psychiatrist AEB patient being followed by mental health provider at this facility, Other provider involved in patient's care AEB patient being followed by gastroenterology, and Referral or follow-up with counselor/therapist AEB patient being seen by licensed clinical social worker at this facility  Patient/Guardian was advised Release of Information must be obtained prior to any record release in order to collaborate their care with an outside provider. Patient/Guardian was advised if they have not already done so to contact the registration department to sign all necessary forms in order for [place] to release  information regarding their care.   Consent: Patient/Guardian gives verbal consent for treatment and assignment of benefits for services provided during this visit. Patient/Guardian expressed understanding and agreed to proceed.   1. Bipolar 2 disorder (HCC)  - escitalopram (LEXAPRO) 10 MG tablet; Take 1 tablet (10 mg total) by mouth daily.  Dispense: 30 tablet; Refill: 1 - cariprazine (VRAYLAR) 3 MG capsule; Take 1 capsule (3 mg total) by mouth daily.  Dispense: 30 capsule; Refill: 1 - gabapentin (NEURONTIN) 400 MG capsule; Take 1 capsule (400 mg total) by mouth 3 (three) times daily.  Dispense: 60 capsule; Refill: 1  2. Generalized anxiety disorder  - escitalopram (LEXAPRO) 10 MG tablet; Take 1 tablet (10 mg total) by mouth daily.  Dispense: 30 tablet; Refill: 1 - gabapentin (NEURONTIN) 400 MG capsule; Take 1 capsule (400 mg total) by mouth 3 (three) times daily.  Dispense: 60 capsule; Refill: 1  Patient to follow up in 6 weeks Provider spent a total of 25 minutes with the patient/reviewing patient's chart  Meta Hatchet, PA 05/20/2023, 12:33 PM

## 2023-05-23 ENCOUNTER — Other Ambulatory Visit (HOSPITAL_COMMUNITY): Payer: Self-pay

## 2023-05-26 ENCOUNTER — Ambulatory Visit
Admission: RE | Admit: 2023-05-26 | Discharge: 2023-05-26 | Disposition: A | Discharge status: Home / Self Care | Payer: Medicaid Other | Source: Ambulatory Visit | Attending: Orthopaedic Surgery | Admitting: Orthopaedic Surgery

## 2023-05-26 ENCOUNTER — Ambulatory Visit (INDEPENDENT_AMBULATORY_CARE_PROVIDER_SITE_OTHER): Payer: Medicaid Other | Admitting: Mental Health

## 2023-05-26 DIAGNOSIS — F3181 Bipolar II disorder: Secondary | ICD-10-CM | POA: Diagnosis not present

## 2023-05-26 DIAGNOSIS — M542 Cervicalgia: Secondary | ICD-10-CM

## 2023-05-26 DIAGNOSIS — F4310 Post-traumatic stress disorder, unspecified: Secondary | ICD-10-CM

## 2023-05-26 DIAGNOSIS — F411 Generalized anxiety disorder: Secondary | ICD-10-CM

## 2023-06-01 ENCOUNTER — Other Ambulatory Visit (HOSPITAL_COMMUNITY): Payer: Self-pay
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
## 2023-06-02 ENCOUNTER — Ambulatory Visit: Payer: Medicaid Other | Admitting: Allergy and Immunology

## 2023-06-02 ENCOUNTER — Telehealth: Payer: Self-pay | Admitting: Orthopaedic Surgery

## 2023-06-02 ENCOUNTER — Other Ambulatory Visit: Payer: Self-pay | Admitting: Orthopaedic Surgery

## 2023-06-02 ENCOUNTER — Other Ambulatory Visit: Payer: Self-pay

## 2023-06-02 NOTE — Telephone Encounter (Signed)
P 

## 2023-06-02 NOTE — Telephone Encounter (Signed)
Patient called stating she needs a refill on gabapentin. Patient stated the bottle she received the medicine was crushed into powder. Patient also stated [facility] needs the "Ok" to refill.

## 2023-06-03 ENCOUNTER — Ambulatory Visit (INDEPENDENT_AMBULATORY_CARE_PROVIDER_SITE_OTHER): Payer: Medicaid Other | Admitting: Allergy

## 2023-06-03 ENCOUNTER — Other Ambulatory Visit: Payer: Self-pay

## 2023-06-03 ENCOUNTER — Encounter: Payer: Self-pay | Admitting: Allergy

## 2023-06-03 ENCOUNTER — Other Ambulatory Visit (HOSPITAL_COMMUNITY): Payer: Self-pay

## 2023-06-03 ENCOUNTER — Telehealth (HOSPITAL_COMMUNITY): Payer: Self-pay | Admitting: *Deleted

## 2023-06-03 VITALS — BP 118/80 | HR 102 | Temp 98.3°F | Ht 65.0 in | Wt 195.1 lb

## 2023-06-03 DIAGNOSIS — J4551 Severe persistent asthma with (acute) exacerbation: Secondary | ICD-10-CM

## 2023-06-03 DIAGNOSIS — J3089 Other allergic rhinitis: Secondary | ICD-10-CM

## 2023-06-03 MED ORDER — BREZTRI AEROSPHERE 160-9-4.8 MCG/ACT IN AERO
2.0000 | INHALATION_SPRAY | Freq: Two times a day (BID) | RESPIRATORY_TRACT | 2 refills | Status: DC
  Filled 2023-06-03: qty 10.7, 30d supply, fill #0

## 2023-06-03 MED ORDER — ALBUTEROL SULFATE (2.5 MG/3ML) 0.083% IN NEBU
2.5000 mg | INHALATION_SOLUTION | RESPIRATORY_TRACT | 1 refills | Status: DC | PRN
  Filled 2023-06-03: qty 90, 5d supply, fill #0

## 2023-06-03 MED ORDER — AZELASTINE HCL 0.1 % NA SOLN
1.0000 | Freq: Two times a day (BID) | NASAL | 2 refills | Status: DC | PRN
  Filled 2023-06-03: qty 30, 25d supply, fill #0

## 2023-06-03 MED ORDER — ALBUTEROL SULFATE HFA 108 (90 BASE) MCG/ACT IN AERS
2.0000 | INHALATION_SPRAY | RESPIRATORY_TRACT | 1 refills | Status: DC | PRN
  Filled 2023-06-03: qty 18, 16d supply, fill #0
  Filled 2023-06-25: qty 18, 16d supply, fill #1

## 2023-06-03 MED ORDER — METHYLPREDNISOLONE ACETATE 80 MG/ML IJ SUSP
80.0000 mg | Freq: Once | INTRAMUSCULAR | Status: AC
  Administered 2023-06-03: 80 mg via INTRAMUSCULAR

## 2023-06-03 MED ORDER — PREDNISONE 10 MG PO TABS
ORAL_TABLET | ORAL | 0 refills | Status: AC
  Filled 2023-06-03: qty 9, 5d supply, fill #0

## 2023-06-03 NOTE — Patient Instructions (Addendum)
Asthma exacerbation Depo IM 80mg  given today. Start prednisone taper. Prednisone 10mg  tablets - take 2 tablets for 4 days then 1 tablet on day 5.   Daily controller medication(s): START Breztri 2 puffs twice a day with spacer and rinse mouth afterwards. Samples given. During respiratory infections/flares:  Pretreat with albuterol 2 puffs or albuterol nebulizer.  If you need to use your albuterol nebulizer machine back to back within 15-30 minutes with no relief then please go to the ER/urgent care for further evaluation.  May use albuterol rescue inhaler 2 puffs or nebulizer every 4 to 6 hours as needed for shortness of breath, chest tightness, coughing, and wheezing. May use albuterol rescue inhaler 2 puffs 5 to 15 minutes prior to strenuous physical activities. Monitor frequency of use - if you need to use it more than twice per week on a consistent basis let us know.  Breathing control goals:  Full participation in all desired activities (may need albuterol before activity) Albuterol use two times or less a week on average (not counting use with activity) Cough interfering with sleep two times or less a month Oral steroids no more than once a year No hospitalizations   Allergic rhinitis Use over the counter antihistamines such as Zyrtec (cetirizine), Claritin (loratadine), Allegra (fexofenadine), or Xyzal (levocetirizine) daily as needed. May switch antihistamines every few months. Use azelastine nasal spray 1-2 sprays per nostril twice a day as needed for runny nose/drainage. Nasal saline spray (i.e., Simply Saline) or nasal saline lavage (i.e., NeilMed) is recommended as needed and prior to medicated nasal sprays.  Follow up in 1 month with Dr. Lucie Leather.  Continue to follow up with all your specialists!

## 2023-06-03 NOTE — Telephone Encounter (Signed)
Patient is asking that her prescription of Gabapentin be submitted back to [facility] pharmacy because [facility] gave her pills that were "messed up and melted."

## 2023-06-03 NOTE — Progress Notes (Signed)
Follow Up Note  RE: Linda Becker MRN: [id number] DOB: 06-29-1990 Date of Office Visit: 06/03/2023  Referring provider: Creola Corn, MD Primary care provider: Creola Corn, MD  Chief Complaint: Cough, Nasal Congestion (C/o sore throat ), Asthma (Flare shortness breath.), Wheezing, and Breathing Problem  History of Present Illness: I had the pleasure of seeing Linda Becker for a follow up visit at the [facility] on 06/03/2023. She is a 32 y.o. female, who is being followed for asthma, rash. Her previous allergy office visit was on 06/13/2022 with Nehemiah Settle FNP. Today is a regular follow up visit.  Failed to follow-up as recommended.  Discussed the use of AI scribe software for clinical note transcription with the patient, who gave verbal consent to proceed.  The patient presents after a year of no follow-up due to personal circumstances. She reports an acute exacerbation of asthma symptoms two days ago, which she describes as the 'scariest' episode she's ever experienced. She woke up around 2 AM with severe shortness of breath and was unable to speak. Her albuterol inhaler, which she used three to four times, provided no relief. She then used her nebulizer with albuterol, which helped somewhat. However, she continues to experience wheezing, coughing, and nasal congestion. She denies fever or chills and reports no known sick contacts.  The patient has been using her albuterol inhaler two to three times per week for the past year, even without acute incidents. She has not been on a steroid inhaler for about a year now. She was previously on Advair, which she reports worked well for her, but she stopped due to a combination of insurance issues. She was also on Dupixent in the past but stopped.   In addition to her respiratory symptoms, the patient reports a sore throat and postnasal drip. She has been taking Zyrtec twice daily and Benadryl as needed for allergies.  She also reports a 'weird' appetite.   She is following with [facility] for liver cirrhosis secondary to NASH. Also has portal HTN and bleeding esophageal varices.      10/06/2022 [facility] transplant visit: "Impression 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 She was diagnosed at the time of a pregnancy complicated by intrahepatic cholestasis of pregnancy, although there had been a question during that pregnancy as to whether she had HELLP syndrome vs preeclampsia. This pregnancy resulted in stillbirth.   We were able to review the results of most recent lab testing, with normal liver enzymes and measures of liver function. MELD score is very low at 6. We discussed that her liver function remains preserved. There is no need for consideration for liver transplantation at this MELD score. Weight loss can result in resolution of steatosis and improvement in fibrosis as well. She hopes that her liver function can remain stable for prolonged time, and she can avoid transplantation.  Most issues are related to GERD/gastritis that is managed locally. She has visit scheduled with GI provider next month. She will ask if ultrasound can be performed locally for [facility] screening that would be needed within the next month.  We will plan for annual follow up for now with monitoring of liver function. Will schedule liver doppler ultrasound for HCC screening the same day as follow up here.   Plan 1. Review of recent labs and procedures. MELD score 6.  2. Ongoing therapy for GERD locally.  3.  Recommend liver ultrasound in upcoming weeks, and every 6 months.  4. Liver doppler ultrasound for HCC screening at next visit. "  Assessment and Plan: Linda Becker is a 32 y.o. female with: Severe persistent asthma with (acute) exacerbation Severe shortness of breath, wheezing, and coughing. Albuterol inhaler and nebulizer  provided minimal relief. Frequent use of albuterol inhaler (2-3 times per week) prior to this incident. No maintenance inhaler for the past year. Depo IM 80mg  given today. Start prednisone taper. Prednisone 10mg  tablets - take 2 tablets for 4 days then 1 tablet on day 5.  Daily controller medication(s): START Breztri 2 puffs twice a day with spacer and rinse mouth afterwards. Samples given. During respiratory infections/flares:  Pretreat with albuterol 2 puffs or albuterol nebulizer.  If you need to use your albuterol nebulizer machine back to back within 15-30 minutes with no relief then please go to the ER/urgent care for further evaluation.  May use albuterol rescue inhaler 2 puffs or nebulizer every 4 to 6 hours as needed for shortness of breath, chest tightness, coughing, and wheezing. May use albuterol rescue inhaler 2 puffs 5 to 15 minutes prior to strenuous physical activities. Monitor frequency of use - if you need to use it more than twice per week on a consistent basis let us know.  Get spirometry at next visit.  Perennial allergic rhinitis Complaining of drainage.  Use over the counter antihistamines such as Zyrtec (cetirizine), Claritin (loratadine), Allegra (fexofenadine), or Xyzal (levocetirizine) daily as needed. May switch antihistamines every few months. Use azelastine nasal spray 1-2 sprays per nostril twice a day as needed for runny nose/drainage. Nasal saline spray (i.e., Simply Saline) or nasal saline lavage (i.e., NeilMed) is recommended as needed and prior to medicated nasal sprays.  Return in about 4 weeks (around 07/01/2023).  Meds ordered this encounter  Medications   albuterol (PROVENTIL) (2.5 MG/3ML) 0.083% nebulizer solution    Sig: Take 3 mLs (2.5 mg total) by nebulization every 4 (four) hours as needed for wheezing or shortness of breath (coughing fits).    Dispense:  75 mL    Refill:  1   albuterol (VENTOLIN HFA) 108 (90 Base) MCG/ACT inhaler    Sig: Inhale  2 puffs into the lungs every 4 (four) hours as needed for wheezing or shortness of breath (coughing fits).    Dispense:  18 g    Refill:  1   Budeson-Glycopyrrol-Formoterol (BREZTRI AEROSPHERE) 160-9-4.8 MCG/ACT AERO    Sig: Inhale 2 puffs into the lungs in the morning and at bedtime. with spacer and rinse mouth afterwards.    Dispense:  10.7 g    Refill:  2   predniSONE (DELTASONE) 10 MG tablet    Sig: Take 2 tablets (20 mg total) by mouth daily for 4 days, THEN 1 tablet (10 mg total) daily for 1 day.    Dispense:  9 tablet    Refill:  0   azelastine (ASTELIN) 0.1 % nasal spray    Sig: Place 1-2 sprays into both nostrils 2 (two) times daily as needed (nasal drainage). Use in each nostril as directed    Dispense:  30 mL    Refill:  2   methylPREDNISolone acetate (DEPO-MEDROL) injection 80 mg   Lab Orders  No laboratory test(s) ordered today    Diagnostics: None.   Medication List:  Current Outpatient Medications  Medication Sig Dispense Refill   acetaminophen-codeine (TYLENOL #3) 300-30 MG tablet Take 1 tablet by mouth every 6 (six) hours. 12 tablet  0   albuterol (PROVENTIL) (2.5 MG/3ML) 0.083% nebulizer solution Take 3 mLs (2.5 mg total) by nebulization every 4 (four) hours as needed for wheezing or shortness of breath (coughing fits). 75 mL 1   albuterol (VENTOLIN HFA) 108 (90 Base) MCG/ACT inhaler Inhale 2 puffs into the lungs every 4 (four) hours as needed for wheezing or shortness of breath (coughing fits). 18 g 1   Ascorbic Acid (VITAMIN C) 1000 MG tablet Take 2,000 mg by mouth 2 (two) times daily.     azelastine (ASTELIN) 0.1 % nasal spray Place 1-2 sprays into both nostrils 2 (two) times daily as needed (nasal drainage). Use in each nostril as directed 30 mL 2   Budeson-Glycopyrrol-Formoterol (BREZTRI AEROSPHERE) 160-9-4.8 MCG/ACT AERO Inhale 2 puffs into the lungs in the morning and at bedtime. with spacer and rinse mouth afterwards. 10.7 g 2   budesonide (RHINOCORT AQUA) 32  MCG/ACT nasal spray Place 1 spray into both nostrils in the morning and at bedtime. (Patient taking differently: Place 1 spray into both nostrils 2 (two) times daily as needed for rhinitis or allergies.) 8.43 mL 2   CALCIUM PO Take 1 tablet by mouth 2 (two) times daily.     cariprazine (VRAYLAR) 3 MG capsule Take 1 capsule (3 mg total) by mouth daily. 30 capsule 1   carvedilol (COREG) 12.5 MG tablet Take 1 tablet (12.5 mg total) by mouth 2 (two) times daily with a meal. 60 tablet 2   Cetirizine HCl 10 MG CAPS Take 10 mg by mouth daily.     diphenhydrAMINE (BENADRYL) 25 MG tablet Take 50 mg by mouth at bedtime as needed for allergies or sleep.     ergocalciferol (VITAMIN D2) 1.25 MG (50000 UT) capsule Take 1 capsule (50,000 Units total) by mouth once a week. (Patient taking differently: Take 50,000 Units by mouth every Monday.) 12 capsule 1   escitalopram (LEXAPRO) 10 MG tablet Take 1 tablet (10 mg total) by mouth daily. 30 tablet 1   famotidine (PEPCID) 40 MG tablet Take 1 tablet (40 mg total) by mouth 2 (two) times daily. Pt need an office visit for further refills. 60 tablet 0   fluconazole (DIFLUCAN) 150 MG tablet Take 1 tablet (150 mg total) by mouth every three (3) days as needed. (Patient taking differently: Take 150 mg by mouth every three (3) days as needed (For yeast infection).) 3 tablet 0   gabapentin (NEURONTIN) 400 MG capsule Take 1 capsule (400 mg total) by mouth 3 (three) times daily. 60 capsule 1   Na Sulfate-K Sulfate-Mg Sulf 17.5-3.13-1.6 GM/177ML SOLN Take as Directed 354 mL 0   naphazoline-pheniramine (EYE ALLERGY RELIEF) 0.025-0.3 % ophthalmic solution Place 2 drops into both eyes 4 (four) times daily as needed for allergies.     predniSONE (DELTASONE) 10 MG tablet Take 2 tablets (20 mg total) by mouth daily for 4 days, THEN 1 tablet (10 mg total) daily for 1 day. 9 tablet 0   Prenatal Vit-Fe Fumarate-FA (MULTIVITAMIN-PRENATAL) 27-0.8 MG TABS tablet Take 1 tablet by mouth 2 (two)  times daily.     promethazine (PHENERGAN) 25 MG suppository Unwrap and insert 1 suppository (25 mg total) rectally every 6 (six) hours as needed for nausea or vomiting. 30 each 1   promethazine (PHENERGAN) 25 MG tablet Take 1 tablet (25 mg total) by mouth every 6 (six) hours as needed. 120 tablet 1   ursodiol (ACTIGALL) 500 MG tablet Take 1 tablet (500 mg total) by mouth 3 (three) times  daily (Patient taking differently: Take 500 mg by mouth 3 (three) times daily as needed (For liver disorder).) 90 tablet 11   Vonoprazan Fumarate (VOQUEZNA) 20 MG TABS Take 1 tablet by mouth daily. 30 tablet 1   XIFAXAN 550 MG TABS tablet Take 1 tablet (550 mg total) by mouth 2 (two) times daily. 180 tablet 3   No current facility-administered medications for this visit.   Facility-Administered Medications Ordered in Other Visits  Medication Dose Route Frequency Provider Last Rate Last Admin   0.9 %  sodium chloride infusion   Intravenous Once PRN Pyrtle, Carie Caddy, MD       albuterol (VENTOLIN HFA) 108 (90 Base) MCG/ACT inhaler 2 puff  2 puff Inhalation Once PRN Pyrtle, Carie Caddy, MD       diphenhydrAMINE (BENADRYL) injection 50 mg  50 mg Intravenous Once PRN Pyrtle, Carie Caddy, MD       EPINEPHrine (EPI-PEN) injection 0.3 mg  0.3 mg Intramuscular Once PRN Pyrtle, Carie Caddy, MD       famotidine (PEPCID) IVPB 20 mg premix  20 mg Intravenous Once PRN Pyrtle, Carie Caddy, MD       iron sucrose (VENOFER) 200 mg in sodium chloride 0.9 % 100 mL IVPB  200 mg Intravenous Once Pyrtle, Carie Caddy, MD       methylPREDNISolone sodium succinate (SOLU-MEDROL) 125 mg/2 mL injection 125 mg  125 mg Intravenous Once PRN Pyrtle, Carie Caddy, MD       promethazine (PHENERGAN) suppository 12.5-25 mg  12.5-25 mg Rectal Q4H PRN Berna Bue, MD       Allergies: Allergies  Allergen Reactions   Progesterone Rash and Other (See Comments)    Was in a form of birth control   Penicillins Rash and Other (See Comments)    Did it involve swelling of the  face/tongue/throat, SOB, or low BP? No Did it involve sudden or severe rash/hives, skin peeling, or any reaction on the inside of your mouth or nose? Yes Did you need to seek medical attention at a hospital or doctor's office? No When did it last happen?      9 + months If all above answers are "NO", may proceed with cephalosporin use.     I reviewed her past medical history, social history, family history, and environmental history and no significant changes have been reported from her previous visit.  Review of Systems  Constitutional:  Negative for appetite change, chills, fever and unexpected weight change.  HENT:  Positive for postnasal drip, sore throat and voice change. Negative for congestion and rhinorrhea.   Eyes:  Negative for itching.  Respiratory:  Positive for cough, chest tightness, shortness of breath and wheezing.   Cardiovascular:  Negative for chest pain.  Gastrointestinal:  Negative for abdominal pain.  Genitourinary:  Negative for difficulty urinating.  Skin:  Negative for rash.  Neurological:  Negative for headaches.    Objective: BP 118/80   Pulse (!) 102   Temp 98.3 F (36.8 C)   Ht 5\' 5"  (1.651 m)   Wt 195 lb 1.6 oz (88.5 kg)   SpO2 99%   BMI 32.47 kg/m  Body mass index is 32.47 kg/m. Physical Exam Vitals and nursing note reviewed.  Constitutional:      Appearance: Normal appearance. She is well-developed.  HENT:     Head: Normocephalic and atraumatic.     Right Ear: Tympanic membrane and external ear normal.     Left Ear: Tympanic membrane and external ear  normal.     Nose: Nose normal.     Mouth/Throat:     Mouth: Mucous membranes are moist.     Pharynx: Oropharynx is clear.  Eyes:     Conjunctiva/sclera: Conjunctivae normal.  Cardiovascular:     Rate and Rhythm: Normal rate and regular rhythm.     Heart sounds: Normal heart sounds. No murmur heard.    No friction rub. No gallop.  Pulmonary:     Effort: Pulmonary effort is normal.      Breath sounds: Wheezing present. No rhonchi or rales.     Comments: Wheezing improved post bronchodilator treatment. Musculoskeletal:     Cervical back: Neck supple.  Skin:    General: Skin is warm.     Findings: No rash.  Neurological:     Mental Status: She is alert and oriented to person, place, and time.  Psychiatric:        Behavior: Behavior normal.    Previous notes and tests were reviewed. The plan was reviewed with the patient/family, and all questions/concerned were addressed.  It was my pleasure to see Linda Becker today and participate in her care. Please feel free to contact me with any questions or concerns.  Sincerely,  Wyline Mood, DO Allergy & Immunology  [facility] office: [phone] [facility] office: [phone]

## 2023-06-04 ENCOUNTER — Other Ambulatory Visit (HOSPITAL_COMMUNITY): Payer: Self-pay

## 2023-06-04 ENCOUNTER — Other Ambulatory Visit (HOSPITAL_COMMUNITY): Payer: Self-pay | Admitting: Physician Assistant

## 2023-06-04 ENCOUNTER — Ambulatory Visit: Payer: Medicaid Other | Admitting: Orthopaedic Surgery

## 2023-06-04 ENCOUNTER — Telehealth (HOSPITAL_COMMUNITY): Payer: Self-pay | Admitting: Physician Assistant

## 2023-06-04 DIAGNOSIS — F3181 Bipolar II disorder: Secondary | ICD-10-CM

## 2023-06-04 DIAGNOSIS — F411 Generalized anxiety disorder: Secondary | ICD-10-CM

## 2023-06-04 MED ORDER — GABAPENTIN 400 MG PO CAPS
400.0000 mg | ORAL_CAPSULE | Freq: Three times a day (TID) | ORAL | 1 refills | Status: DC
  Filled 2023-06-04: qty 60, 20d supply, fill #0
  Filled 2023-06-19: qty 60, 20d supply, fill #1

## 2023-06-04 NOTE — Progress Notes (Signed)
Provider was contacted by Elder Love, RN regarding patient's request for gabapentin refill.  Patient's medication to be e-prescribed to pharmacy of choice.

## 2023-06-04 NOTE — Telephone Encounter (Signed)
Message acknowledged and reviewed. Medication to be e-prescribed to pharmacy of choice.

## 2023-06-05 ENCOUNTER — Other Ambulatory Visit: Payer: Self-pay | Admitting: *Deleted

## 2023-06-05 ENCOUNTER — Inpatient Hospital Stay: Payer: Medicaid Other | Admitting: Hematology and Oncology

## 2023-06-05 ENCOUNTER — Inpatient Hospital Stay: Payer: Medicaid Other

## 2023-06-05 DIAGNOSIS — D509 Iron deficiency anemia, unspecified: Secondary | ICD-10-CM

## 2023-06-05 NOTE — Telephone Encounter (Signed)
Message acknowledged and reviewed.  Patient's medication was sent to pharmacy of choice.

## 2023-06-05 NOTE — Assessment & Plan Note (Deleted)
Due to cirrhosis of the liver and splenomegaly   Lab review: 02/04/2018: Platelets 135 07/27/2018: Platelet count 102 01/28/2020: Platelet count 53 02/07/2020: Platelet count 63 05/13/2022: Platelets 87 01/06/2023: Hemoglobin 7, MCV 77.4, platelets 71, WBC 2.6, iron saturation 2.8%, TIBC 459, ferritin 2.9 01/19/2023: Hemoglobin 7.4, MCV 86, WBC 2.1, platelets 71 (patient received 4 doses of IV iron so far) 03/02/2023: Hemoglobin 10.4, platelets 68, WBC 2.7, reticulocytes 1.2%, iron saturation 7%, B12 432, ferritin 8 05/08/2023: Hemoglobin 12.1, platelets 57   Previous work-up was negative hepatitis B and C.  Hepatitis A showed seropositivity   IV iron: November 2022, March 2023, March 2024, July 2024, August 2024, July 2024, September 2024 (Monoferric) Treatment plan: Recommended IV iron once again (will request Monoferric because she is a very hard IV stick) Prior gastric bypass surgery

## 2023-06-08 ENCOUNTER — Ambulatory Visit: Payer: Medicaid Other | Admitting: Obstetrics and Gynecology

## 2023-06-11 ENCOUNTER — Other Ambulatory Visit (HOSPITAL_COMMUNITY): Payer: Self-pay

## 2023-06-15 ENCOUNTER — Encounter: Payer: Medicaid Other | Admitting: Internal Medicine

## 2023-06-18 ENCOUNTER — Other Ambulatory Visit (HOSPITAL_COMMUNITY): Payer: Self-pay

## 2023-06-19 ENCOUNTER — Other Ambulatory Visit: Payer: Self-pay | Admitting: Internal Medicine

## 2023-06-19 ENCOUNTER — Encounter: Payer: Self-pay | Admitting: Radiology

## 2023-06-19 ENCOUNTER — Other Ambulatory Visit: Payer: Self-pay

## 2023-06-19 ENCOUNTER — Other Ambulatory Visit (HOSPITAL_COMMUNITY): Payer: Self-pay

## 2023-06-19 ENCOUNTER — Ambulatory Visit: Payer: Medicaid Other | Admitting: Internal Medicine

## 2023-06-19 MED ORDER — PROMETHAZINE HCL 25 MG PO TABS
25.0000 mg | ORAL_TABLET | Freq: Four times a day (QID) | ORAL | 1 refills | Status: DC | PRN
  Filled 2023-06-19: qty 120, 30d supply, fill #0
  Filled 2023-07-10 – 2023-07-11 (×2): qty 120, 30d supply, fill #1

## 2023-06-19 MED ORDER — FAMOTIDINE 40 MG PO TABS
40.0000 mg | ORAL_TABLET | Freq: Two times a day (BID) | ORAL | 0 refills | Status: DC
  Filled 2023-06-19: qty 60, 30d supply, fill #0

## 2023-06-19 MED ORDER — VOQUEZNA 20 MG PO TABS
1.0000 | ORAL_TABLET | Freq: Every day | ORAL | 1 refills | Status: DC
  Filled 2023-06-19 – 2023-08-09 (×2): qty 30, 30d supply, fill #0
  Filled 2023-09-06: qty 30, 30d supply, fill #1

## 2023-06-20 ENCOUNTER — Other Ambulatory Visit (HOSPITAL_COMMUNITY): Payer: Self-pay

## 2023-06-22 ENCOUNTER — Ambulatory Visit: Payer: Medicaid Other | Admitting: Physician Assistant

## 2023-06-22 ENCOUNTER — Other Ambulatory Visit (HOSPITAL_COMMUNITY): Payer: Self-pay

## 2023-06-25 ENCOUNTER — Ambulatory Visit: Payer: Medicaid Other | Admitting: Physician Assistant

## 2023-06-29 ENCOUNTER — Inpatient Hospital Stay: Payer: Medicaid Other | Attending: Hematology and Oncology

## 2023-06-29 DIAGNOSIS — D509 Iron deficiency anemia, unspecified: Secondary | ICD-10-CM | POA: Insufficient documentation

## 2023-06-30 ENCOUNTER — Inpatient Hospital Stay: Payer: Medicaid Other

## 2023-06-30 ENCOUNTER — Other Ambulatory Visit (HOSPITAL_COMMUNITY): Payer: Self-pay

## 2023-06-30 ENCOUNTER — Telehealth: Payer: Self-pay

## 2023-06-30 ENCOUNTER — Other Ambulatory Visit: Payer: Self-pay

## 2023-06-30 ENCOUNTER — Ambulatory Visit (INDEPENDENT_AMBULATORY_CARE_PROVIDER_SITE_OTHER): Payer: Medicaid Other | Admitting: Allergy and Immunology

## 2023-06-30 ENCOUNTER — Other Ambulatory Visit: Payer: Self-pay | Admitting: *Deleted

## 2023-06-30 ENCOUNTER — Encounter: Payer: Self-pay | Admitting: Hematology and Oncology

## 2023-06-30 VITALS — BP 118/88 | HR 99 | Temp 98.0°F | Resp 16 | Ht 65.95 in | Wt 190.3 lb

## 2023-06-30 DIAGNOSIS — L989 Disorder of the skin and subcutaneous tissue, unspecified: Secondary | ICD-10-CM

## 2023-06-30 DIAGNOSIS — J3089 Other allergic rhinitis: Secondary | ICD-10-CM

## 2023-06-30 DIAGNOSIS — D509 Iron deficiency anemia, unspecified: Secondary | ICD-10-CM

## 2023-06-30 DIAGNOSIS — Z23 Encounter for immunization: Secondary | ICD-10-CM | POA: Diagnosis not present

## 2023-06-30 DIAGNOSIS — J4540 Moderate persistent asthma, uncomplicated: Secondary | ICD-10-CM | POA: Diagnosis not present

## 2023-06-30 LAB — CBC WITH DIFFERENTIAL (CANCER CENTER ONLY)
Abs Immature Granulocytes: 0.01 10*3/uL (ref 0.00–0.07)
Basophils Absolute: 0 10*3/uL (ref 0.0–0.1)
Basophils Relative: 0 %
Eosinophils Absolute: 0.2 10*3/uL (ref 0.0–0.5)
Eosinophils Relative: 4 %
HCT: 40.9 % (ref 36.0–46.0)
Hemoglobin: 13.4 g/dL (ref 12.0–15.0)
Immature Granulocytes: 0 %
Lymphocytes Relative: 19 %
Lymphs Abs: 0.8 10*3/uL (ref 0.7–4.0)
MCH: 30.5 pg (ref 26.0–34.0)
MCHC: 32.8 g/dL (ref 30.0–36.0)
MCV: 93 fL (ref 80.0–100.0)
Monocytes Absolute: 0.2 10*3/uL (ref 0.1–1.0)
Monocytes Relative: 5 %
Neutro Abs: 3 10*3/uL (ref 1.7–7.7)
Neutrophils Relative %: 72 %
Platelet Count: 64 10*3/uL — ABNORMAL LOW (ref 150–400)
RBC: 4.4 MIL/uL (ref 3.87–5.11)
RDW: 13.5 % (ref 11.5–15.5)
WBC Count: 4.2 10*3/uL (ref 4.0–10.5)
nRBC: 0 % (ref 0.0–0.2)

## 2023-06-30 LAB — FERRITIN: Ferritin: 18 ng/mL (ref 11–307)

## 2023-06-30 LAB — IRON AND IRON BINDING CAPACITY (CC-WL,HP ONLY)
Iron: 51 ug/dL (ref 28–170)
Saturation Ratios: 12 % (ref 10.4–31.8)
TIBC: 413 ug/dL (ref 250–450)
UIBC: 362 ug/dL (ref 148–442)

## 2023-06-30 MED ORDER — MOMETASONE FUROATE 0.1 % EX OINT
TOPICAL_OINTMENT | Freq: Two times a day (BID) | CUTANEOUS | 1 refills | Status: AC
  Filled 2023-06-30: qty 45, 30d supply, fill #0
  Filled 2023-08-09: qty 90, 30d supply, fill #0
  Filled 2024-05-25: qty 45, 34d supply, fill #0
  Filled 2024-05-25: qty 15, 30d supply, fill #0

## 2023-06-30 MED ORDER — BREZTRI AEROSPHERE 160-9-4.8 MCG/ACT IN AERO
2.0000 | INHALATION_SPRAY | Freq: Two times a day (BID) | RESPIRATORY_TRACT | 2 refills | Status: DC
  Filled 2023-06-30 – 2023-11-08 (×5): qty 10.7, 30d supply, fill #0

## 2023-06-30 MED ORDER — DESONIDE 0.05 % EX OINT
1.0000 | TOPICAL_OINTMENT | Freq: Two times a day (BID) | CUTANEOUS | 1 refills | Status: AC
  Filled 2023-06-30 – 2023-08-09 (×3): qty 60, 30d supply, fill #0
  Filled 2024-05-25 (×2): qty 15, 30d supply, fill #0

## 2023-06-30 MED ORDER — ALBUTEROL SULFATE (2.5 MG/3ML) 0.083% IN NEBU
2.5000 mg | INHALATION_SOLUTION | RESPIRATORY_TRACT | 1 refills | Status: AC | PRN
  Filled 2023-06-30 – 2024-05-06 (×4): qty 150, 9d supply, fill #0
  Filled 2024-05-25: qty 150, 15d supply, fill #0
  Filled 2024-05-25: qty 90, 5d supply, fill #0

## 2023-06-30 MED ORDER — FAMOTIDINE 40 MG PO TABS
40.0000 mg | ORAL_TABLET | Freq: Two times a day (BID) | ORAL | 1 refills | Status: AC
  Filled 2023-06-30 – 2023-08-09 (×2): qty 180, 90d supply, fill #0
  Filled 2023-12-14 – 2024-02-13 (×4): qty 180, 90d supply, fill #1

## 2023-06-30 MED ORDER — NEBULIZER MASK ADULT MISC
1.0000 | 1 refills | Status: AC
  Filled 2023-06-30: qty 1, 28d supply, fill #0
  Filled 2023-08-09: qty 1, fill #0

## 2023-06-30 MED ORDER — ALBUTEROL SULFATE HFA 108 (90 BASE) MCG/ACT IN AERS
2.0000 | INHALATION_SPRAY | RESPIRATORY_TRACT | 1 refills | Status: DC | PRN
  Filled 2023-07-10 – 2023-08-09 (×2): qty 18, 16d supply, fill #0
  Filled 2023-09-06: qty 18, 16d supply, fill #1

## 2023-06-30 MED ORDER — CETIRIZINE HCL 10 MG PO TABS
10.0000 mg | ORAL_TABLET | Freq: Two times a day (BID) | ORAL | 1 refills | Status: AC
  Filled 2023-06-30: qty 180, 90d supply, fill #0
  Filled 2023-06-30: qty 60, 30d supply, fill #0

## 2023-06-30 NOTE — Progress Notes (Signed)
 [place] - [place] - [place] - [place] - [place]   Follow-up Note  Referring Provider: Onita Rush, MD Primary Provider: Onita Rush, MD Date of Office Visit: 06/30/2023  Subjective:   Linda Becker (DOB: June 06, 1991) is a 33 y.o. female who [facility] in re-evaluation of the following:  HPI: Linda Becker returns to this clinic in evaluation of asthma, allergic rhinitis, LPR.  I have not seen her in this clinic since 13 August 2021.  She did visit with Dr. Luke on 03 June 2023 for an exacerbation of her asthma.  Since she visited with Dr. Luke last month she has really done very well and has very little issues revolving around her respiratory tract and she rarely uses the short acting bronchodilator while she continues on a triple inhaler twice a day.  Prior to her visit with Dr. Luke she actually did relatively well as she did not have need to use a short acting bronchodilator without the use of any controller agents for a prolonged period in time.  This improvement in the control of her asthma appeared to correlate with weight loss.  She does not really have much problems with her nose and occasionally uses a antihistamine without the use of any nasal steroids.  She is currently being treated with Voquezna  and Pepcid  for her reflux and recurrent emesis and she takes Phenergan  every day as well for this condition.  She is still followed by [facility] for her NASH and the latest assessment by that transplant team is that she is doing very well with preserved liver function.  She complains about having an issue with dermatitis since 2019.  A few times per year she will develop an eruption described as very red and painful and itchy and it can involve her face or can involve her torso.  It can last for several weeks and then it resolves without any scar / hyperpigmentation.  She has not been treated with any topical steroids for this  issue.  And she has developed a area on the very tip of her #3 finger right hand that has been a persistent issue over the course of the past several months.  She has not received the flu vaccine yet.  Allergies as of 06/30/2023       Reactions   Progesterone Rash, Other (See Comments)   Was in a form of birth control   Penicillins Rash, Other (See Comments)   Did it involve swelling of the face/tongue/throat, SOB, or low BP? No Did it involve sudden or severe rash/hives, skin peeling, or any reaction on the inside of your mouth or nose? Yes Did you need to seek medical attention at a hospital or doctor's office? No When did it last happen?      9 + months If all above answers are NO, may proceed with cephalosporin use.        Medication List    albuterol  (2.5 MG/3ML) 0.083% nebulizer solution Commonly known as: PROVENTIL  Take 3 mLs (2.5 mg total) by nebulization every 4 (four) hours as needed for wheezing or shortness of breath (coughing fits).   Ventolin  HFA 108 (90 Base) MCG/ACT inhaler Generic drug: albuterol  Inhale 2 puffs into the lungs every 4 (four) hours as needed for wheezing or shortness of breath (coughing fits).   Azelastine  HCl 137 MCG/SPRAY Soln Place 1-2 sprays into both nostrils 2 (two) times daily as needed (nasal drainage). Use in each nostril  as directed   Breztri  Aerosphere 160-9-4.8 MCG/ACT Aero Generic drug: Budeson-Glycopyrrol-Formoterol  Inhale 2 puffs into the lungs in the morning and at bedtime. with spacer and rinse mouth afterwards.   CALCIUM  PO Take 1 tablet by mouth 2 (two) times daily.   cariprazine  3 MG capsule Commonly known as: Vraylar  Take 1 capsule (3 mg total) by mouth daily.   carvedilol  12.5 MG tablet Commonly known as: Coreg  Take 1 tablet (12.5 mg total) by mouth 2 (two) times daily with a meal.   Cetirizine  HCl 10 MG Caps Take 10 mg by mouth daily.   diphenhydrAMINE  25 MG tablet Commonly known as: BENADRYL  Take 50 mg by  mouth at bedtime as needed for allergies or sleep.   escitalopram  10 MG tablet Commonly known as: Lexapro  Take 1 tablet (10 mg total) by mouth daily.   Eye Allergy  Relief 0.025-0.3 % ophthalmic solution Generic drug: naphazoline-pheniramine Place 2 drops into both eyes 4 (four) times daily as needed for allergies.   famotidine  40 MG tablet Commonly known as: PEPCID  Take 1 tablet (40 mg total) by mouth 2 (two) times daily. Pt need an office visit for further refills.   fluconazole  150 MG tablet Commonly known as: DIFLUCAN  Take 1 tablet (150 mg total) by mouth every three (3) days as needed. What changed: reasons to take this   gabapentin  400 MG capsule Commonly known as: NEURONTIN  Take 1 capsule (400 mg total) by mouth 3 (three) times daily.   multivitamin-prenatal 27-0.8 MG Tabs tablet Take 1 tablet by mouth 2 (two) times daily.   Na Sulfate-K Sulfate-Mg Sulf 17.5-3.13-1.6 GM/177ML Soln Take as Directed   promethazine  25 MG tablet Commonly known as: PHENERGAN  Take 1 tablet (25 mg total) by mouth every 6 (six) hours as needed.   ursodiol  500 MG tablet Commonly known as: ACTIGALL  Take 1 tablet (500 mg total) by mouth 3 (three) times daily   vitamin C  1000 MG tablet Take 2,000 mg by mouth 2 (two) times daily.   Vitamin D  (Ergocalciferol ) 1.25 MG (50000 UNIT) Caps capsule Commonly known as: DRISDOL  Take 1 capsule (50,000 Units total) by mouth once a week. What changed: when to take this   Voquezna  20 MG Tabs Generic drug: Vonoprazan Fumarate  Take 1 tablet by mouth daily.   Xifaxan  550 MG Tabs tablet Generic drug: rifaximin  Take 1 tablet (550 mg total) by mouth 2 (two) times daily.    Past Medical History:  Diagnosis Date   Allergic rhinitis    remote hx of allergy  shots   Asthma    since childhood  on controller meds extrinsic dr tiana   Bipolar II disorder (HCC)    Cirrhosis (HCC)    Erosive esophagitis    Esophageal varices (HCC)    Family history of  adverse reaction to anesthesia    mom and sister difficult to awaken per patient    Gallbladder sludge    Gastropathy    GERD (gastroesophageal reflux disease)    on nexium   for  long term sx since childhood   H/O miscarriage, not currently pregnant    [redacted] weeks  march 2016   Hepatic steatosis    Hiatal hernia    Hypothyroidism    Migraine    Murmur    pt reports MVP   Portal hypertension (HCC)    Sessile colonic polyp    Splenomegaly    Steatohepatitis    Syncope    under eval ? cause   Tachycardia    episodes with  near syncope eval dr Margaretann  on no meds dced LABA   Thrombocytopenia (HCC)    Upper GI bleed     Past Surgical History:  Procedure Laterality Date   BIOPSY  09/03/2018   Procedure: BIOPSY;  Surgeon: Albertus Gordy HERO, MD;  Location: [facility];  Service: Gastroenterology;;   BIOPSY  09/26/2020   Procedure: BIOPSY;  Surgeon: Albertus Gordy HERO, MD;  Location: [facility];  Service: Gastroenterology;;   BIOPSY  05/12/2022   Procedure: BIOPSY;  Surgeon: Albertus Gordy HERO, MD;  Location: [facility];  Service: Gastroenterology;;   broken right femur  2008   rod placed   COLONOSCOPY     COLONOSCOPY WITH ESOPHAGOGASTRODUODENOSCOPY (EGD)  03/24/2023   [facility] Albertus at [facility]   COLONOSCOPY WITH PROPOFOL  N/A 09/03/2018   Procedure: COLONOSCOPY WITH PROPOFOL ;  Surgeon: Albertus Gordy HERO, MD;  Location: [facility];  Service: Gastroenterology;  Laterality: N/A;   DILATION AND CURETTAGE OF UTERUS     ESOPHAGOGASTRODUODENOSCOPY (EGD) WITH PROPOFOL  N/A 09/03/2018   Procedure: ESOPHAGOGASTRODUODENOSCOPY (EGD) WITH PROPOFOL ;  Surgeon: Albertus Gordy HERO, MD;  Location: [facility];  Service: Gastroenterology;  Laterality: N/A;   ESOPHAGOGASTRODUODENOSCOPY (EGD) WITH PROPOFOL  N/A 01/27/2020   Procedure: ESOPHAGOGASTRODUODENOSCOPY (EGD) WITH PROPOFOL ;  Surgeon: Wilhelmenia Aloha Raddle., MD;  Location[facility];  Service: Gastroenterology;  Laterality: N/A;   ESOPHAGOGASTRODUODENOSCOPY (EGD) WITH  PROPOFOL  N/A 03/12/2020   Procedure: ESOPHAGOGASTRODUODENOSCOPY (EGD) WITH PROPOFOL ;  Surgeon: Albertus Gordy HERO, MD;  Location: [facility];  Service: Gastroenterology;  Laterality: N/A;   ESOPHAGOGASTRODUODENOSCOPY (EGD) WITH PROPOFOL  N/A 09/26/2020   Procedure: ESOPHAGOGASTRODUODENOSCOPY (EGD) WITH PROPOFOL ;  Surgeon: Albertus Gordy HERO, MD;  Location: [facility];  Service: Gastroenterology;  Laterality: N/A;   ESOPHAGOGASTRODUODENOSCOPY (EGD) WITH PROPOFOL  N/A 04/29/2021   Procedure: ESOPHAGOGASTRODUODENOSCOPY (EGD) WITH PROPOFOL ;  Surgeon: Albertus Gordy HERO, MD;  Location: [facility];  Service: Gastroenterology;  Laterality: N/A;   ESOPHAGOGASTRODUODENOSCOPY (EGD) WITH PROPOFOL  N/A 05/12/2022   Procedure: ESOPHAGOGASTRODUODENOSCOPY (EGD) WITH PROPOFOL ;  Surgeon: Albertus Gordy HERO, MD;  Location: [facility];  Service: Gastroenterology;  Laterality: N/A;   GASTRIC VARICES BANDING  01/27/2020   Procedure: GASTRIC VARICES BANDING;  Surgeon: Wilhelmenia Aloha Raddle., MD;  Location: [facility] END[facility]COPY;  Service: Gastroenterology;;   HOT HEMOSTASIS N/A 04/29/2021   Procedure: HOT HEMOSTASIS (ARGON PLASMA COAGULATION/BICAP);  Surgeon: Albertus Gordy HERO, MD;  Location: [facility];  Service: Gastroenterology;  Laterality: N/A;   IR TRANSCATHETER BX  01/19/2019   IR US  GUIDE VASC ACCESS RIGHT  01/19/2019   IR VENOGRAM HEPATIC W HEMODYNAMIC EVALUATION  01/19/2019   LAPAROSCOPIC GASTRIC SLEEVE RESECTION N/A 06/28/2019   Procedure: LAPAROSCOPIC GASTRIC SLEEVE RESECTION, Upper Endo, ERAS Pathway;  Surgeon: Signe Mitzie LABOR, MD;  Location: [facility];  Service: General;  Laterality: N/A;   OB ultrasound N/A 12/01/2017   see report   POLYPECTOMY  09/03/2018   Procedure: POLYPECTOMY;  Surgeon: Albertus Gordy HERO, MD;  Location: [facility];  Service: Gastroenterology;;   TONSILLECTOMY  2006   UPPER GASTROINTESTINAL ENDOSCOPY     WISDOM TOOTH EXTRACTION      Review of systems negative except as noted in HPI / PMHx or noted  below:  Review of Systems  Constitutional: Negative.   HENT: Negative.    Eyes: Negative.   Respiratory: Negative.    Cardiovascular: Negative.   Gastrointestinal: Negative.   Genitourinary: Negative.   Musculoskeletal: Negative.   Skin: Negative.   Neurological: Negative.   Endo/Heme/Allergies: Negative.   Psychiatric/Behavioral: Negative.       Objective:  Vitals:   06/30/23 0835  BP: 118/88  Pulse: 99  Resp: 16  Temp: 98 F (36.7 C)  SpO2: 98%   Height: 5' 5.95 (167.5 cm)  Weight: 190 lb 4.8 oz (86.3 kg)   Physical Exam Constitutional:      Appearance: She is not diaphoretic.  HENT:     Head: Normocephalic.     Right Ear: Tympanic membrane, ear canal and external ear normal.     Left Ear: Tympanic membrane, ear canal and external ear normal.     Nose: Nose normal. No mucosal edema or rhinorrhea.     Mouth/Throat:     Pharynx: Uvula midline. No oropharyngeal exudate.  Eyes:     Conjunctiva/sclera: Conjunctivae normal.  Neck:     Thyroid : No thyromegaly.     Trachea: Trachea normal. No tracheal tenderness or tracheal deviation.  Cardiovascular:     Rate and Rhythm: Normal rate and regular rhythm.     Heart sounds: Normal heart sounds, S1 normal and S2 normal. No murmur heard. Pulmonary:     Effort: No respiratory distress.     Breath sounds: Normal breath sounds. No stridor. No wheezing or rales.  Lymphadenopathy:     Head:     Right side of head: No tonsillar adenopathy.     Left side of head: No tonsillar adenopathy.     Cervical: No cervical adenopathy.  Skin:    Findings: Rash (Tip of #3 finger right hand with split skin surrounded by hard firm group nodules) present. No erythema.     Nails: There is no clubbing.  Neurological:     Mental Status: She is alert.     Diagnostics: Spirometry was performed and demonstrated an FEV1 of 4.31 at 130 % of predicted.  Assessment and Plan:   1. Asthma, moderate persistent, well-controlled   2.  Perennial allergic rhinitis   3. Inflammatory dermatosis   4. Encounter for immunization    1.  Treat and prevent inflammation of airway:  A. Breztri  - 2 inhalations 2 times per day  2.  Treat and prevent reflux/LPR/recurrent emesis:   A.  Continue Voquezna , Pepcid , Phenergan   3.  If needed:  A. Albuterol  - 2 inhalations or nebulization every 4-6 hours  B. Cetirizine  10 mg - 1 tab 1-2 times per day C. Desonide  ointment to face -1-2 times per day D. Mometasone  0.1% ointment to body 1-2 times per day  4. Return for skin testing without antihistamine use  5. Influenza vaccine administered in clinic today  Linda Becker appears to be doing pretty well regarding her asthma and she will remain on a triple inhaler at this point in time.  Her reflux and LPR and recurrent emesis is being handled by others and she will continue on her plan as noted above.  She appears to have intermittent inflammatory dermatosis and she can use desonide  for her face and mometasone  for her body.  She is interested in exploring possible triggers for this dermatitis and we will skin test her to define aeroallergen and food hypersensitivity.  She appears to have a warty looking lesion affecting her #3 finger right hand and we will treat that with a topical steroid and see what happens and if it remains then we will refer her onto dermatology for a more definitive approach to this issue.  I will see her back in this clinic for skin testing.  Camellia Denis, MD Allergy  / Immunology [facility]

## 2023-06-30 NOTE — Patient Instructions (Addendum)
  1.  Treat and prevent inflammation of airway:  A. Breztri  - 2 inhalations 2 times per day  2.  Treat and prevent reflux/LPR/recurrent emesis:   A.  Continue Voquezna , Pepcid , Phenergan   3.  If needed:  A. Albuterol  - 2 inhalations or nebulization every 4-6 hours  B. Cetirizine  10 mg - 1 tab 1-2 times per day C. Desonide  ointment to face -1-2 times per day D. Mometasone  0.1% ointment to body 1-2 times per day  4. Return for skin testing without antihistamine use  5. Influenza vaccine administered in clinic today

## 2023-06-30 NOTE — Telephone Encounter (Signed)
*  Asthma/Allergy   Pharmacy Patient Advocate Encounter   Received notification from CoverMyMeds that prior authorization for Breztri  Aerosphere 160-9-4.8MCG/ACT aerosol  is required/requested.   Insurance verification completed.   The patient is insured through [facility] .   Per test claim: PA required; PA submitted to above mentioned insurance via CoverMyMeds Key/confirmation #/EOC [id number] Status is pending

## 2023-06-30 NOTE — Telephone Encounter (Signed)
 Pharmacy Patient Advocate Encounter  Received notification from [employer] that Prior Authorization for Breztri  Aerosphere 160-9-4.8MCG/ACT aerosol  has been APPROVED from 06/30/2023 to 06/29/2024. Ran test claim, Copay is $4.00. This test claim was processed through [facility]- copay amounts may vary at other pharmacies due to pharmacy/plan contracts, or as the patient moves through the different stages of their insurance plan.   Processed via [facility]

## 2023-07-01 ENCOUNTER — Inpatient Hospital Stay (HOSPITAL_BASED_OUTPATIENT_CLINIC_OR_DEPARTMENT_OTHER): Payer: Medicaid Other | Admitting: Adult Health

## 2023-07-01 ENCOUNTER — Encounter: Payer: Self-pay | Admitting: Allergy and Immunology

## 2023-07-01 ENCOUNTER — Telehealth: Payer: Medicaid Other | Admitting: Hematology and Oncology

## 2023-07-01 ENCOUNTER — Other Ambulatory Visit (HOSPITAL_COMMUNITY): Payer: Self-pay

## 2023-07-01 ENCOUNTER — Telehealth (HOSPITAL_COMMUNITY): Payer: Medicaid Other | Admitting: Physician Assistant

## 2023-07-01 ENCOUNTER — Encounter (HOSPITAL_COMMUNITY): Payer: Self-pay | Admitting: Physician Assistant

## 2023-07-01 ENCOUNTER — Encounter: Payer: Self-pay | Admitting: Adult Health

## 2023-07-01 DIAGNOSIS — D509 Iron deficiency anemia, unspecified: Secondary | ICD-10-CM | POA: Diagnosis not present

## 2023-07-01 DIAGNOSIS — D696 Thrombocytopenia, unspecified: Secondary | ICD-10-CM | POA: Diagnosis not present

## 2023-07-01 DIAGNOSIS — F99 Mental disorder, not otherwise specified: Secondary | ICD-10-CM

## 2023-07-01 DIAGNOSIS — F411 Generalized anxiety disorder: Secondary | ICD-10-CM

## 2023-07-01 DIAGNOSIS — F5105 Insomnia due to other mental disorder: Secondary | ICD-10-CM

## 2023-07-01 DIAGNOSIS — F4310 Post-traumatic stress disorder, unspecified: Secondary | ICD-10-CM | POA: Diagnosis not present

## 2023-07-01 DIAGNOSIS — F3181 Bipolar II disorder: Secondary | ICD-10-CM

## 2023-07-01 MED ORDER — TRAZODONE HCL 50 MG PO TABS
50.0000 mg | ORAL_TABLET | Freq: Every day | ORAL | 1 refills | Status: DC
  Filled 2023-07-01 – 2023-08-09 (×2): qty 30, 30d supply, fill #0
  Filled 2023-09-06: qty 30, 30d supply, fill #1

## 2023-07-01 MED ORDER — GABAPENTIN 400 MG PO CAPS
400.0000 mg | ORAL_CAPSULE | Freq: Three times a day (TID) | ORAL | 1 refills | Status: DC
  Filled 2023-07-01 – 2023-07-10 (×3): qty 60, 20d supply, fill #0
  Filled 2023-07-23 – 2023-07-27 (×2): qty 60, 20d supply, fill #1

## 2023-07-01 MED ORDER — LAMOTRIGINE 25 MG PO TABS
ORAL_TABLET | ORAL | 1 refills | Status: DC
  Filled 2023-07-01: qty 60, 30d supply, fill #0
  Filled 2023-08-09: qty 60, 37d supply, fill #0
  Filled 2023-09-06: qty 60, 30d supply, fill #1

## 2023-07-01 MED ORDER — CARIPRAZINE HCL 3 MG PO CAPS
3.0000 mg | ORAL_CAPSULE | Freq: Every day | ORAL | 1 refills | Status: DC
  Filled 2023-07-01 – 2023-08-09 (×2): qty 30, 30d supply, fill #0
  Filled 2023-09-06: qty 30, 30d supply, fill #1

## 2023-07-01 MED ORDER — ESCITALOPRAM OXALATE 10 MG PO TABS
10.0000 mg | ORAL_TABLET | Freq: Every day | ORAL | 1 refills | Status: DC
  Filled 2023-07-01 – 2023-08-09 (×2): qty 30, 30d supply, fill #0
  Filled 2023-09-06: qty 30, 30d supply, fill #1

## 2023-07-01 NOTE — Progress Notes (Signed)
BH MD/PA/NP OP Progress Note  Virtual Visit via Video Note  I connected with Linda Becker on 07/01/23 at 11:00 AM EST by a video enabled telemedicine application and verified that I am speaking with the correct person using two identifiers.  Location: Patient: Home Provider: Clinic   I discussed the limitations of evaluation and management by telemedicine and the availability of in person appointments. The patient expressed understanding and agreed to proceed.  Follow Up Instructions:  I discussed the assessment and treatment plan with the patient. The patient was provided an opportunity to ask questions and all were answered. The patient agreed with the plan and demonstrated an understanding of the instructions.   The patient was advised to call back or seek an in-person evaluation if the symptoms worsen or if the condition fails to improve as anticipated.  I provided 26 minutes of non-face-to-face time during this encounter.  Meta Hatchet, PA    07/01/2023 8:22 PM Linda Becker  MRN:  [id number]  Chief Complaint:  Chief Complaint  Patient presents with   Follow-up   Medication Management   HPI:   Linda Becker is a 33 year old female with a past psychiatric history significant for bipolar 2 disorder and generalized anxiety disorder who presents to [facility] via virtual video visit for follow-up and medication management.  Patient is currently being managed on the following psychiatric medications:  Escitalopram 10 mg daily Cariprazine (Vraylar) 3 mg daily Gabapentin 400 mg 3 times daily  Patient presents to the encounter stating that she has been more emotional lately.  She reports that her crying spells have increased most recently.  She attributes her emotional state to being unsure of things in her life.  She reports that she is very isolated and alone and feels trapped.  She states that she has not left the  house in over a week and is struggling with trying to figure herself for her past mistakes.  Patient appears to be ruminating over her past history with her ex-husband.  Patient reports that she is taking her medications regularly.  Though she is taking her medications regularly, patient reports that she has been experiencing worsening depression.  Patient rates her depression at 9 out of 10 with 10 being most severe.  She endorses depression 6 days out of the week.  Patient endorses the following depressive symptoms: feelings of sadness, crying spells, self-isolation, sleep disturbances, irritability/restlessness.  She describes her mood as being on a very sad version of autopilot.  In addition to her depressive state, patient endorses anxiety that is worse than usual.  Patient rates her anxiety an 8 out of 10.  Patient's main stressor involves financial instability.  In addition to her anxiety, patient reports that she has been experiencing several panic attacks.  She reports that she feels like she is going to run out of her skin due to the world ending.  A PHQ-9 screen was performed with the patient scoring a 16.  A GAD-7 screen was also performed with the patient scoring an 18.  Patient is alert and oriented x 4, calm, cooperative, and fully engaged in conversation during the encounter.  Patient endorses sad mood.  Patient is tearful while presenting her history.  Patient exhibits depressed mood with congruent affect.  Patient denies suicidal or homicidal ideations.  She further denies auditory or visual hallucinations and does not appear to be responding to internal/external stimuli.  Patient endorses fair sleep and receives  on average 5 hours of sleep per night.  Patient endorses good appetite and eats on average 2-3 meals per day.  Patient denies alcohol consumption, tobacco use, or illicit drug use.  Visit Diagnosis:    ICD-10-CM   1. PTSD (post-traumatic stress disorder)  F43.10 lamoTRIgine  (LAMICTAL) 25 MG tablet    2. Bipolar 2 disorder (HCC)  F31.81 cariprazine (VRAYLAR) 3 MG capsule    gabapentin (NEURONTIN) 400 MG capsule    escitalopram (LEXAPRO) 10 MG tablet    3. Generalized anxiety disorder  F41.1 gabapentin (NEURONTIN) 400 MG capsule    escitalopram (LEXAPRO) 10 MG tablet    4. Insomnia due to other mental disorder  F51.05 traZODone (DESYREL) 50 MG tablet   F99       Past Psychiatric History:  Patient endorses a past psychiatric history significant for bipolar 2 disorder, OCD, anxiety, and PTSD   Patient denies a past history of hospitalization due to mental health.  She does report that she was recently voluntarily admitted overnight at [facility] in the past (02/24/2023)   Patient denies a past history of suicide attempts   Patient denies a past history of homicide attempts  Past Medical History:  Past Medical History:  Diagnosis Date   Allergic rhinitis    remote hx of allergy shots   Asthma    since childhood  on controller meds extrinsic dr Sharyn Lull   Bipolar II disorder (HCC)    Cirrhosis (HCC)    Erosive esophagitis    Esophageal varices (HCC)    Family history of adverse reaction to anesthesia    mom and sister difficult to awaken per patient    Gallbladder sludge    Gastropathy    GERD (gastroesophageal reflux disease)    on nexium  for  long term sx since childhood   H/O miscarriage, not currently pregnant    [redacted] weeks  march 2016   Hepatic steatosis    Hiatal hernia    Hypothyroidism    Migraine    Murmur    pt reports MVP   Portal hypertension (HCC)    Sessile colonic polyp    Splenomegaly    Steatohepatitis    Syncope    under eval ? cause   Tachycardia    episodes with near syncope eval dr Nadara Eaton  on no meds dced LABA   Thrombocytopenia (HCC)    Upper GI bleed     Past Surgical History:  Procedure Laterality Date   BIOPSY  09/03/2018   Procedure: BIOPSY;  Surgeon: Beverley Fiedler, MD;  Location: [facility];  Service:  Gastroenterology;;   BIOPSY  09/26/2020   Procedure: BIOPSY;  Surgeon: Beverley Fiedler, MD;  Location: [facility];  Service: Gastroenterology;;   BIOPSY  05/12/2022   Procedure: BIOPSY;  Surgeon: Beverley Fiedler, MD;  Location: [facility];  Service: Gastroenterology;;   broken right femur  2008   rod placed   COLONOSCOPY     COLONOSCOPY WITH ESOPHAGOGASTRODUODENOSCOPY (EGD)  03/24/2023   Erick Blinks at [facility]   COLONOSCOPY WITH PROPOFOL N/A 09/03/2018   Procedure: COLONOSCOPY WITH PROPOFOL;  Surgeon: Beverley Fiedler, MD;  Location: [facility];  Service: Gastroenterology;  Laterality: N/A;   DILATION AND CURETTAGE OF UTERUS     ESOPHAGOGASTRODUODENOSCOPY (EGD) WITH PROPOFOL N/A 09/03/2018   Procedure: ESOPHAGOGASTRODUODENOSCOPY (EGD) WITH PROPOFOL;  Surgeon: Beverley Fiedler, MD;  Location: [facility];  Service: Gastroenterology;  Laterality: N/A;   ESOPHAGOGASTRODUODENOSCOPY (EGD) WITH PROPOFOL N/A 01/27/2020  Procedure: ESOPHAGOGASTRODUODENOSCOPY (EGD) WITH PROPOFOL;  Surgeon: Meridee Score Netty Starring., MD;  Location: [facility];  Service: Gastroenterology;  Laterality: N/A;   ESOPHAGOGASTRODUODENOSCOPY (EGD) WITH PROPOFOL N/A 03/12/2020   Procedure: ESOPHAGOGASTRODUODENOSCOPY (EGD) WITH PROPOFOL;  Surgeon: Beverley Fiedler, MD;  Location: [facility];  Service: Gastroenterology;  Laterality: N/A;   ESOPHAGOGASTRODUODENOSCOPY (EGD) WITH PROPOFOL N/A 09/26/2020   Procedure: ESOPHAGOGASTRODUODENOSCOPY (EGD) WITH PROPOFOL;  Surgeon: Beverley Fiedler, MD;  Location: [facility];  Service: Gastroenterology;  Laterality: N/A;   ESOPHAGOGASTRODUODENOSCOPY (EGD) WITH PROPOFOL N/A 04/29/2021   Procedure: ESOPHAGOGASTRODUODENOSCOPY (EGD) WITH PROPOFOL;  Surgeon: Beverley Fiedler, MD;  Location: [facility];  Service: Gastroenterology;  Laterality: N/A;   ESOPHAGOGASTRODUODENOSCOPY (EGD) WITH PROPOFOL N/A 05/12/2022   Procedure: ESOPHAGOGASTRODUODENOSCOPY (EGD) WITH PROPOFOL;  Surgeon: Beverley Fiedler, MD;   Location: [facility];  Service: Gastroenterology;  Laterality: N/A;   GASTRIC VARICES BANDING  01/27/2020   Procedure: GASTRIC VARICES BANDING;  Surgeon: Meridee Score Netty Starring., MD;  Location: [facility];  Service: Gastroenterology;;   HOT HEMOSTASIS N/A 04/29/2021   Procedure: HOT HEMOSTASIS (ARGON PLASMA COAGULATION/BICAP);  Surgeon: Beverley Fiedler, MD;  Location: [facility];  Service: Gastroenterology;  Laterality: N/A;   IR TRANSCATHETER BX  01/19/2019   IR US GUIDE VASC ACCESS RIGHT  01/19/2019   IR VENOGRAM HEPATIC W HEMODYNAMIC EVALUATION  01/19/2019   LAPAROSCOPIC GASTRIC SLEEVE RESECTION N/A 06/28/2019   Procedure: LAPAROSCOPIC GASTRIC SLEEVE RESECTION, Upper Endo, ERAS Pathway;  Surgeon: Berna Bue, MD;  Location: [facility];  Service: General;  Laterality: N/A;   OB ultrasound N/A 12/01/2017   see report   POLYPECTOMY  09/03/2018   Procedure: POLYPECTOMY;  Surgeon: Beverley Fiedler, MD;  Location: [facility];  Service: Gastroenterology;;   TONSILLECTOMY  2006   UPPER GASTROINTESTINAL ENDOSCOPY     WISDOM TOOTH EXTRACTION      Family Psychiatric History:  Father - depression Sister - bipolar disorder, depression Nephew - Autistic with severe ADHD   Family history of suicide attempt: Patient denies Family history of homicide attempt: Patient denies Family history of substance abuse: Patient denies  Family History:  Family History  Problem Relation Age of Onset   Liver disease Mother 79       advanced non-alcoholic cirrhosis   Arthritis Mother    Hyperlipidemia Mother    Heart disease Mother    Hypertension Mother    Diabetes Mother    Kidney disease Mother    Bleeding Disorder Mother    Colon polyps Mother    Asthma Father    Arthritis Father    Hyperlipidemia Father    Heart disease Father    Hypertension Father    Diabetes Father    Colon polyps Father    Breast cancer Maternal Aunt    Breast cancer Paternal Aunt    Esophageal cancer Paternal Aunt     Arthritis Maternal Grandmother    Breast cancer Maternal Grandmother    Thyroid disease Maternal Grandmother    Diabetes Maternal Grandfather    Pancreatic cancer Maternal Grandfather    Breast cancer Paternal Grandmother    Diabetes Paternal Grandmother    Colon cancer Paternal Grandfather    Diabetes Paternal Grandfather    Liver cancer Paternal Grandfather    Stomach cancer Paternal Grandfather     Social History:  Social History   Socioeconomic History   Marital status: Married    Spouse name: Not on file   Number of children: 1   Years of education: Not on  file   Highest education level: Not on file  Occupational History   Not on file  Tobacco Use   Smoking status: Never   Smokeless tobacco: Never  Vaping Use   Vaping status: Never Used  Substance and Sexual Activity   Alcohol use: No    Alcohol/week: 0.0 standard drinks of alcohol   Drug use: No   Sexual activity: Yes    Birth control/protection: None  Other Topics Concern   Not on file  Social History Narrative   6-7 hours of sleep per night   Works full time   Lives with her parents sis and  Infant nephew   [employer]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   Divorced   8 week preg loss 2023-09-12     Son passed away 2017-07-24   Social Drivers of Health   Financial Resource Strain: High Risk (05/26/2023)   Overall Financial Resource Strain (CARDIA)    Difficulty of Paying Living Expenses: Very hard  Food Insecurity: Food Insecurity Present (02/24/2023)   Hunger Vital Sign    Worried About Running Out of Food in the Last Year: Often true    Ran Out of Food in the Last Year: Often true  Transportation Needs: No Transportation Needs (02/24/2023)   PRAPARE - Administrator, Civil Service (Medical): No    Lack of Transportation (Non-Medical): No  Physical Activity: Inactive (05/26/2023)   Exercise Vital Sign    Days of Exercise per Week: 0 days    Minutes of Exercise per Session: 0 min   Stress: Stress Concern Present (05/26/2023)   [employer] of Occupational Health - Occupational Stress Questionnaire    Feeling of Stress : Very much  Social Connections: Socially Isolated (05/26/2023)   Social Connection and Isolation Panel [NHANES]    Frequency of Communication with Friends and Family: More than three times a week    Frequency of Social Gatherings with Friends and Family: Twice a week    Attends Religious Services: Never    Database administrator or Organizations: No    Attends Banker Meetings: Never    Marital Status: Separated    Allergies:  Allergies  Allergen Reactions   Progesterone Rash and Other (See Comments)    Was in a form of birth control   Penicillins Rash and Other (See Comments)    Did it involve swelling of the face/tongue/throat, SOB, or low BP? No Did it involve sudden or severe rash/hives, skin peeling, or any reaction on the inside of your mouth or nose? Yes Did you need to seek medical attention at a hospital or doctor's office? No When did it last happen?      9 + months If all above answers are "NO", may proceed with cephalosporin use.      Metabolic Disorder Labs: Lab Results  Component Value Date   HGBA1C 4.4 (L) 02/24/2023   MPG 80 02/24/2023   No results found for: "PROLACTIN" Lab Results  Component Value Date   CHOL 221 (H) 02/24/2023   TRIG 103 02/24/2023   HDL 49 02/24/2023   CHOLHDL 4.5 02/24/2023   VLDL 21 02/24/2023   LDLCALC 151 (H) 02/24/2023   LDLCALC 122 (H) 03/11/2019   Lab Results  Component Value Date   TSH 0.809 02/24/2023   TSH 3.05 08/27/2022    Therapeutic Level Labs: No results found for: "LITHIUM" No results found for: "VALPROATE" No results  found for: "CBMZ"  Current Medications: Current Outpatient Medications  Medication Sig Dispense Refill   lamoTRIgine (LAMICTAL) 25 MG tablet Take 1 tablet (25 mg total) by mouth daily for 14 days, THEN 2 tablets (50 mg total) daily. 60  tablet 1   traZODone (DESYREL) 50 MG tablet Take 1 tablet (50 mg total) by mouth at bedtime. 30 tablet 1   albuterol (PROVENTIL) (2.5 MG/3ML) 0.083% nebulizer solution Take 3 mLs (2.5 mg total) by nebulization every 4 (four) hours as needed for wheezing or shortness of breath (coughing fits). 150 mL 1   albuterol (VENTOLIN HFA) 108 (90 Base) MCG/ACT inhaler Inhale 2 puffs into the lungs every 4 (four) hours as needed for wheezing or shortness of breath (coughing fits). 18 g 1   Ascorbic Acid (VITAMIN C) 1000 MG tablet Take 2,000 mg by mouth 2 (two) times daily.     azelastine (ASTELIN) 0.1 % nasal spray Place 1-2 sprays into both nostrils 2 (two) times daily as needed (nasal drainage). Use in each nostril as directed 30 mL 2   Budeson-Glycopyrrol-Formoterol (BREZTRI AEROSPHERE) 160-9-4.8 MCG/ACT AERO Inhale 2 puffs into the lungs in the morning and at bedtime. with spacer and rinse mouth afterwards. 10.7 g 2   CALCIUM PO Take 1 tablet by mouth 2 (two) times daily.     cariprazine (VRAYLAR) 3 MG capsule Take 1 capsule (3 mg total) by mouth daily. 30 capsule 1   carvedilol (COREG) 12.5 MG tablet Take 1 tablet (12.5 mg total) by mouth 2 (two) times daily with a meal. 60 tablet 2   cetirizine (ZYRTEC) 10 MG tablet Take 1 tablet (10 mg total) by mouth 2 (two) times daily. 180 tablet 1   desonide (DESOWEN) 0.05 % ointment Apply 1 Application topically 2 (two) times daily. Apply to the face 180 g 1   diphenhydrAMINE (BENADRYL) 25 MG tablet Take 50 mg by mouth at bedtime as needed for allergies or sleep.     ergocalciferol (VITAMIN D2) 1.25 MG (50000 UT) capsule Take 1 capsule (50,000 Units total) by mouth once a week. (Patient taking differently: Take 50,000 Units by mouth every Monday.) 12 capsule 1   escitalopram (LEXAPRO) 10 MG tablet Take 1 tablet (10 mg total) by mouth daily. 30 tablet 1   famotidine (PEPCID) 40 MG tablet Take 1 tablet (40 mg total) by mouth 2 (two) times daily. Pt need an office visit  for further refills. 180 tablet 1   fluconazole (DIFLUCAN) 150 MG tablet Take 1 tablet (150 mg total) by mouth every three (3) days as needed. (Patient taking differently: Take 150 mg by mouth every three (3) days as needed (For yeast infection).) 3 tablet 0   gabapentin (NEURONTIN) 400 MG capsule Take 1 capsule (400 mg total) by mouth 3 (three) times daily. 60 capsule 1   mometasone (ELOCON) 0.1 % ointment Apply topically 2 (two) times daily. Apply to the body 135 g 1   Na Sulfate-K Sulfate-Mg Sulf 17.5-3.13-1.6 GM/177ML SOLN Take as Directed 354 mL 0   naphazoline-pheniramine (EYE ALLERGY RELIEF) 0.025-0.3 % ophthalmic solution Place 2 drops into both eyes 4 (four) times daily as needed for allergies.     Prenatal Vit-Fe Fumarate-FA (MULTIVITAMIN-PRENATAL) 27-0.8 MG TABS tablet Take 1 tablet by mouth 2 (two) times daily.     promethazine (PHENERGAN) 25 MG tablet Take 1 tablet (25 mg total) by mouth every 6 (six) hours as needed. 120 tablet 1   Respiratory Therapy Supplies (NEBULIZER MASK ADULT) MISC Use as  directed 1 each 1   ursodiol (ACTIGALL) 500 MG tablet Take 1 tablet (500 mg total) by mouth 3 (three) times daily (Patient taking differently: Take 500 mg by mouth 3 (three) times daily as needed (For liver disorder).) 90 tablet 11   Vonoprazan Fumarate (VOQUEZNA) 20 MG TABS Take 1 tablet by mouth daily. 30 tablet 1   XIFAXAN 550 MG TABS tablet Take 1 tablet (550 mg total) by mouth 2 (two) times daily. 180 tablet 3   No current facility-administered medications for this visit.   Facility-Administered Medications Ordered in Other Visits  Medication Dose Route Frequency Provider Last Rate Last Admin   0.9 %  sodium chloride infusion   Intravenous Once PRN Pyrtle, Carie Caddy, MD       albuterol (VENTOLIN HFA) 108 (90 Base) MCG/ACT inhaler 2 puff  2 puff Inhalation Once PRN Pyrtle, Carie Caddy, MD       diphenhydrAMINE (BENADRYL) injection 50 mg  50 mg Intravenous Once PRN Pyrtle, Carie Caddy, MD        EPINEPHrine (EPI-PEN) injection 0.3 mg  0.3 mg Intramuscular Once PRN Pyrtle, Carie Caddy, MD       famotidine (PEPCID) IVPB 20 mg premix  20 mg Intravenous Once PRN Pyrtle, Carie Caddy, MD       iron sucrose (VENOFER) 200 mg in sodium chloride 0.9 % 100 mL IVPB  200 mg Intravenous Once Pyrtle, Carie Caddy, MD       methylPREDNISolone sodium succinate (SOLU-MEDROL) 125 mg/2 mL injection 125 mg  125 mg Intravenous Once PRN Pyrtle, Carie Caddy, MD       promethazine (PHENERGAN) suppository 12.5-25 mg  12.5-25 mg Rectal Q4H PRN Berna Bue, MD         Musculoskeletal: Strength & Muscle Tone: within normal limits Gait & Station: normal Patient leans: N/A  Psychiatric Specialty Exam: Review of Systems  Psychiatric/Behavioral:  Positive for dysphoric mood and sleep disturbance. Negative for decreased concentration, hallucinations, self-injury and suicidal ideas. The patient is nervous/anxious. The patient is not hyperactive.     There were no vitals taken for this visit.There is no height or weight on file to calculate BMI.  General Appearance: Casual  Eye Contact:  Good  Speech:  Clear and Coherent and Normal Rate  Volume:  Normal  Mood:  Anxious and Depressed  Affect:  Congruent and Tearful  Thought Process:  Coherent, Goal Directed, and Descriptions of Associations: Intact  Orientation:  Full (Time, Place, and Person)  Thought Content: WDL   Suicidal Thoughts:  No  Homicidal Thoughts:  No  Memory:  Immediate;   Good Recent;   Good Remote;   Good  Judgement:  Good  Insight:  Good  Psychomotor Activity:  Normal  Concentration:  Concentration: Good and Attention Span: Good  Recall:  Good  Fund of Knowledge: Good  Language: Good  Akathisia:  No  Handed:  Right  AIMS (if indicated): not done  Assets:  Communication Skills Desire for Improvement Housing Social Support  ADL's:  Intact  Cognition: WNL  Sleep:  Good   Screenings: GAD-7    Flowsheet Row Video Visit from 07/01/2023 in [facility] Video Visit from 05/20/2023 in [facility] Video Visit from 04/14/2023 in [facility] Office Visit from 02/26/2023 in [facility]  Total GAD-7 Score 18 12 6 19       PHQ2-9    Flowsheet Row Video Visit from 07/01/2023 in  [facility] Video Visit from 05/20/2023 in [facility] Video Visit from 04/14/2023 in [facility] Counselor from 03/04/2023 in [facility] Office Visit from 02/26/2023 in [facility]  PHQ-2 Total Score 6 4 2 6 6   PHQ-9 Total Score 16 10 7 25 23       Flowsheet Row Video Visit from 07/01/2023 in [facility] Counselor from 05/26/2023 in [facility] Video Visit from 05/20/2023 in [facility]  C-SSRS RISK CATEGORY No Risk No Risk No Risk        Assessment and Plan:   Linda Becker is a 33 year old female with a past psychiatric history significant for bipolar 2 disorder and generalized anxiety disorder who presents to [facility] via virtual video visit for follow-up and medication management.  Patient presents to the encounter stating that she has been experiencing worsening depression and anxiety despite taking her medications regularly.  Patient's current mood appears to be attributed to a variety of stressors in her life including ruminating over her past relationship with her ex-husband and financial instability.  Patient denies experiencing manic symptoms at this time but does endorse issues with sleep.  Provider recommended placing patient on trazodone 50 mg at bedtime for the management of her sleep issues.  Provider also recommended placing patient on Lamictal 25 mg for 14 days, followed by  50 mg daily for mood stability and the management of her depressive symptoms.  Patient was agreeable to recommendations.  Patient's medications to be prescribed to pharmacy of choice.  Following completion of the encounter, patient was made aware of the side effects to her newly out of medications.  Patient vocalized understanding.  Patient is currently being seen by a licensed clinical social worker at this facility and her next appointment was scheduled for 07/27/2023.  Patient was made aware that the following labs would need to be obtained due to being on Vraylar: Lipid panel, complete metabolic panel, complete blood count with differential, and hemoglobin A1c.  Patient informed provider that she would get fasting labs from her primary care provider (Dr. Timothy Lasso) during her next appointment.  Collaboration of Care: Collaboration of Care: Medication Management AEB Provider managing patient's psychiatric medications, Primary Care Provider AEB patient being followed by a primary care provider, Psychiatrist AEB patient being followed by mental health provider at this facility, Other provider involved in patient's care AEB patient being followed by gastroenterology, and Referral or follow-up with counselor/therapist AEB patient being seen by licensed clinical social worker at this facility  Patient/Guardian was advised Release of Information must be obtained prior to any record release in order to collaborate their care with an outside provider. Patient/Guardian was advised if they have not already done so to contact the registration department to sign all necessary forms in order for [place] to release information regarding their care.   Consent: Patient/Guardian gives verbal consent for treatment and assignment of benefits for services provided during this visit. Patient/Guardian expressed understanding and agreed to proceed.   1. Bipolar 2 disorder (HCC)  - cariprazine (VRAYLAR) 3 MG capsule; Take 1  capsule (3 mg total) by mouth daily.  Dispense: 30 capsule; Refill: 1 - gabapentin (NEURONTIN) 400 MG capsule; Take 1 capsule (400 mg total) by mouth 3 (three) times daily.  Dispense: 60 capsule; Refill: 1 - escitalopram (LEXAPRO) 10 MG tablet; Take 1 tablet (10  mg total) by mouth daily.  Dispense: 30 tablet; Refill: 1  2. Generalized anxiety disorder  - gabapentin (NEURONTIN) 400 MG capsule; Take 1 capsule (400 mg total) by mouth 3 (three) times daily.  Dispense: 60 capsule; Refill: 1 - escitalopram (LEXAPRO) 10 MG tablet; Take 1 tablet (10 mg total) by mouth daily.  Dispense: 30 tablet; Refill: 1  3. PTSD (post-traumatic stress disorder) (Primary)  - lamoTRIgine (LAMICTAL) 25 MG tablet; Take 1 tablet (25 mg total) by mouth daily for 14 days, THEN 2 tablets (50 mg total) daily.  Dispense: 60 tablet; Refill: 1  4. Insomnia due to other mental disorder  - traZODone (DESYREL) 50 MG tablet; Take 1 tablet (50 mg total) by mouth at bedtime.  Dispense: 30 tablet; Refill: 1  Patient to follow up in 6 weeks Provider spent a total of 26 minutes with the patient/reviewing patient's chart  Meta Hatchet, PA 07/01/2023, 8:22 PM

## 2023-07-01 NOTE — Progress Notes (Signed)
[facility] Cancer Follow up:    Linda Corn, MD [address] [place] [address]  I connected with Linda Becker on 07/03/23 at  9:20 AM EST by telephone and verified that I am speaking with the correct person using two identifiers.  I discussed the limitations, risks, security and privacy concerns of performing an evaluation and management service by telephone and the availability of in person appointments.  I also discussed with the patient that there may be a patient responsible charge related to this service. The patient expressed understanding and agreed to proceed.   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  Hepatitis A showed seropositivity   Iron Deficiency Anemia Chronic anemia with low iron saturation (7%) and ferritin (8%). Previous iron infusions with Venofer required frequently (every 3 months)  Monoferric 03/13/2023 x 1   CURRENT THERAPY: intermittent IV iron  INTERVAL HISTORY: Linda Becker 33 y.o. female with a known history of iron deficiency, has been receiving treatments with Venofer and Monoferric. The most recent treatment with Monoferric was administered in September. The patient reports that the Monoferric treatment appears to be more effective than the  Venofer, maintaining its effectiveness for a longer period. Typically, three months post-treatment, the patient's iron levels would be nearing zero, but with the Monoferric, the levels have remained stable. The patient has not experienced any symptoms related to iron deficiency since the last treatment. The patient's platelet counts have been fluctuating between 60 and 100, and she is aware of this as it is attributed to her known splenomegaly and cirrhosis.  She denies easy bruising/bleeding.  Ferritin levels over past 18 months as below:   Latest Reference Range & Units 01/09/22 15:26 05/13/22 12:24 08/27/22 09:46 01/06/23 12:36 02/24/23 18:35 03/02/23 09:55 06/30/23 09:47  Ferritin 11 - 307 ng/mL 13 (L) 6 (L) 5.9 (L) 2.9 (L) 11 8 (L) 18  (L): Data is abnormally low  Platelet count over last 18 months:   Latest Reference Range & Units 01/09/22 15:26 01/23/22 14:30 05/05/22 14:40 05/13/22 12:24 08/27/22 09:46 01/06/23 12:36 01/13/23 09:31 01/19/23 11:41 01/21/23 09:49 02/24/23 18:35 03/02/23 09:55 03/13/23 08:00 04/03/23 12:58 05/08/23 15:29 06/30/23 09:47  Platelets 150 - 400 K/uL 81.0 (L) 74.0 (L) 80.0 (L) 87.0 (L) 86.0 (L) 71.0 (L) 81.0 (L) 71 (L) 72.0 (L) 100 (L) 68 (L) 70 (L) 57.0 (L) 57.0 (L) 64 (L)  (L): Data is abnormally low (E): External lab result Patient Active Problem List   Diagnosis Date Noted   Bipolar 2 disorder (HCC) 02/26/2023   Generalized anxiety disorder 02/26/2023   PTSD (post-traumatic stress disorder) 02/26/2023   Eosinophilic esophagitis 05/14/2022   IDA (iron deficiency anemia) 05/02/2021   Anemia    Upper abdominal pain    Gastritis and gastroduodenitis    NASH (nonalcoholic steatohepatitis) 08/23/2020   Generalized abdominal pain 08/23/2020   Chronic nausea 08/23/2020   Esophageal varices  without bleeding (HCC)    H/O gastric bypass 02/26/2020   Idiopathic esophageal varices with bleeding (HCC)    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   Portal  hypertension (HCC)    Thrombocytopenia (HCC) 08/12/2018   Spontaneous vaginal delivery 12/22/2017   IUFD at 20 weeks or more of gestation 12/20/2017   Snoring 11/10/2017   Morbid obesity (HCC) 05/19/2017   Adult hypothyroidism 05/19/2017   BMI 40.0-44.9, adult (HCC) 11/09/2015   GERD (gastroesophageal reflux disease)    Tachycardia    Syncope    Allergic rhinitis    Persistent asthma with undetermined severity    Migraine with aura 09/26/2011    is allergic to progesterone and penicillins.  MEDICAL HISTORY: Past Medical History:  Diagnosis Date   Allergic rhinitis    remote hx of allergy shots   Asthma    since childhood  on controller meds extrinsic dr Sharyn Lull   Bipolar II disorder (HCC)    Cirrhosis (HCC)    Erosive esophagitis    Esophageal varices (HCC)    Family history of adverse reaction to anesthesia    mom and sister difficult to awaken per patient    Gallbladder sludge    Gastropathy    GERD (gastroesophageal reflux disease)    on nexium  for  long term sx since childhood   H/O miscarriage, not currently pregnant    [redacted] weeks  march 2016   Hepatic steatosis    Hiatal hernia    Hypothyroidism    Migraine    Murmur    pt reports MVP   Portal hypertension (HCC)    Sessile colonic polyp    Splenomegaly    Steatohepatitis    Syncope    under eval ? cause   Tachycardia    episodes with near syncope eval dr Nadara Eaton  on no meds dced LABA   Thrombocytopenia (HCC)    Upper GI bleed     SURGICAL HISTORY: Past Surgical History:  Procedure Laterality Date   BIOPSY  09/03/2018   Procedure: BIOPSY;  Surgeon: Beverley Fiedler, MD;  Location: [facility];  Service: Gastroenterology;;   BIOPSY  09/26/2020   Procedure: BIOPSY;  Surgeon: Beverley Fiedler, MD;  Location: [facility];  Service: Gastroenterology;;   BIOPSY  05/12/2022   Procedure: BIOPSY;  Surgeon: Beverley Fiedler, MD;  Location: [facility];  Service: Gastroenterology;;   broken right femur  2008   rod  placed   COLONOSCOPY     COLONOSCOPY WITH ESOPHAGOGASTRODUODENOSCOPY (EGD)  03/24/2023   Erick Blinks at [facility]   COLONOSCOPY WITH PROPOFOL N/A 09/03/2018   Procedure: COLONOSCOPY WITH PROPOFOL;  Surgeon: Beverley Fiedler, MD;  Location: [facility];  Service: Gastroenterology;  Laterality: N/A;   DILATION AND CURETTAGE OF UTERUS     ESOPHAGOGASTRODUODENOSCOPY (EGD) WITH PROPOFOL N/A 09/03/2018   Procedure: ESOPHAGOGASTRODUODENOSCOPY (EGD) WITH PROPOFOL;  Surgeon: Beverley Fiedler, MD;  Location: [facility];  Service: Gastroenterology;  Laterality: N/A;   ESOPHAGOGASTRODUODENOSCOPY (EGD) WITH PROPOFOL N/A 01/27/2020   Procedure: ESOPHAGOGASTRODUODENOSCOPY (EGD) WITH PROPOFOL;  Surgeon: Meridee Score Netty Starring., MD;  Location: [facility] END[facility]COPY;  Service: Gastroenterology;  Laterality: N/A;   ESOPHAGOGASTRODUODENOSCOPY (EGD) WITH PROPOFOL N/A 03/12/2020   Procedure: ESOPHAGOGASTRODUODENOSCOPY (EGD) WITH PROPOFOL;  Surgeon: Beverley Fiedler, MD;  Location: [facility];  Service: Gastroenterology;  Laterality: N/A;   ESOPHAGOGASTRODUODENOSCOPY (EGD) WITH PROPOFOL N/A 09/26/2020   Procedure: ESOPHAGOGASTRODUODENOSCOPY (EGD) WITH PROPOFOL;  Surgeon: Beverley Fiedler, MD;  Location: [facility];  Service: Gastroenterology;  Laterality: N/A;   ESOPHAGOGASTRODUODENOSCOPY (EGD) WITH PROPOFOL N/A 04/29/2021   Procedure: ESOPHAGOGASTRODUODENOSCOPY (EGD) WITH PROPOFOL;  Surgeon: Beverley Fiedler, MD;  Location: [facility];  Service: Gastroenterology;  Laterality: N/A;   ESOPHAGOGASTRODUODENOSCOPY (EGD) WITH PROPOFOL N/A 05/12/2022   Procedure: ESOPHAGOGASTRODUODENOSCOPY (EGD) WITH PROPOFOL;  Surgeon: Beverley Fiedler, MD;  Location: [facility];  Service: Gastroenterology;  Laterality: N/A;   GASTRIC VARICES BANDING  01/27/2020   Procedure: GASTRIC VARICES BANDING;  Surgeon: Meridee Score Netty Starring., MD;  Location: [facility] ENDOSCOPY;  Service: Gastroenterology;;   HOT HEMOSTASIS N/A 04/29/2021   Procedure: HOT HEMOSTASIS (ARGON  PLASMA COAGULATION/BICAP);  Surgeon: Beverley Fiedler, MD;  Location: [facility];  Service: Gastroenterology;  Laterality: N/A;   IR TRANSCATHETER BX  01/19/2019   IR US GUIDE VASC ACCESS RIGHT  01/19/2019   IR VENOGRAM HEPATIC W HEMODYNAMIC EVALUATION  01/19/2019   LAPAROSCOPIC GASTRIC SLEEVE RESECTION N/A 06/28/2019   Procedure: LAPAROSCOPIC GASTRIC SLEEVE RESECTION, Upper Endo, ERAS Pathway;  Surgeon: Berna Bue, MD;  Location: [facility];  Service: General;  Laterality: N/A;   OB ultrasound N/A 12/01/2017   see report   POLYPECTOMY  09/03/2018   Procedure: POLYPECTOMY;  Surgeon: Beverley Fiedler, MD;  Location: [facility];  Service: Gastroenterology;;   TONSILLECTOMY  07-22-04   UPPER GASTROINTESTINAL ENDOSCOPY     WISDOM TOOTH EXTRACTION      SOCIAL HISTORY: Social History   Socioeconomic History   Marital status: Married    Spouse name: Not on file   Number of children: 1   Years of education: Not on file   Highest education level: Not on file  Occupational History   Not on file  Tobacco Use   Smoking status: Never   Smokeless tobacco: Never  Vaping Use   Vaping status: Never Used  Substance and Sexual Activity   Alcohol use: No    Alcohol/week: 0.0 standard drinks of alcohol   Drug use: No   Sexual activity: Yes    Birth control/protection: None  Other Topics Concern   Not on file  Social History Narrative   6-7 hours of sleep per night   Works full time   Lives with her parents sis and  Infant nephew   [employer]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   Divorced   8 week preg loss September 10, 2023     Son passed away 07/22/17   Social Drivers of Health   Financial Resource Strain: High Risk (05/26/2023)   Overall Financial Resource Strain (CARDIA)    Difficulty of Paying Living Expenses: Very hard  Food Insecurity: Food Insecurity Present (02/24/2023)   Hunger Vital Sign    Worried About Running Out of Food in the Last Year: Often true    Ran Out of Food  in the Last Year: Often true  Transportation Needs: No Transportation Needs (02/24/2023)   PRAPARE - Administrator, Civil Service (Medical): No    Lack of Transportation (Non-Medical): No  Physical Activity: Inactive (05/26/2023)   Exercise Vital Sign    Days of Exercise per Week: 0 days    Minutes of Exercise per Session: 0 min  Stress: Stress Concern Present (05/26/2023)   [employer] of Occupational Health - Occupational Stress Questionnaire    Feeling of Stress : Very much  Social Connections: Socially Isolated (05/26/2023)   Social Connection and Isolation Panel [NHANES]    Frequency of Communication with Friends and Family: More than  three times a week    Frequency of Social Gatherings with Friends and Family: Twice a week    Attends Religious Services: Never    Database administrator or Organizations: No    Attends Banker Meetings: Never    Marital Status: Separated  Intimate Partner Violence: Not At Risk (02/24/2023)   Humiliation, Afraid, Rape, and Kick questionnaire    Fear of Current or Ex-Partner: No    Emotionally Abused: No    Physically Abused: No    Sexually Abused: No    FAMILY HISTORY: Family History  Problem Relation Age of Onset   Liver disease Mother 34       advanced non-alcoholic cirrhosis   Arthritis Mother    Hyperlipidemia Mother    Heart disease Mother    Hypertension Mother    Diabetes Mother    Kidney disease Mother    Bleeding Disorder Mother    Colon polyps Mother    Asthma Father    Arthritis Father    Hyperlipidemia Father    Heart disease Father    Hypertension Father    Diabetes Father    Colon polyps Father    Breast cancer Maternal Aunt    Breast cancer Paternal Aunt    Esophageal cancer Paternal Aunt    Arthritis Maternal Grandmother    Breast cancer Maternal Grandmother    Thyroid disease Maternal Grandmother    Diabetes Maternal Grandfather    Pancreatic cancer Maternal Grandfather    Breast  cancer Paternal Grandmother    Diabetes Paternal Grandmother    Colon cancer Paternal Grandfather    Diabetes Paternal Grandfather    Liver cancer Paternal Grandfather    Stomach cancer Paternal Grandfather     Review of Systems  Constitutional:  Negative for appetite change, chills, fatigue, fever and unexpected weight change.  HENT:   Negative for hearing loss, lump/mass and trouble swallowing.   Eyes:  Negative for eye problems and icterus.  Respiratory:  Negative for chest tightness, cough and shortness of breath.   Cardiovascular:  Negative for chest pain, leg swelling and palpitations.  Gastrointestinal:  Negative for abdominal distention, abdominal pain, constipation, diarrhea, nausea and vomiting.  Endocrine: Negative for hot flashes.  Genitourinary:  Negative for difficulty urinating.   Musculoskeletal:  Negative for arthralgias.  Skin:  Negative for itching and rash.  Neurological:  Negative for dizziness, extremity weakness, headaches and numbness.  Hematological:  Negative for adenopathy. Does not bruise/bleed easily.  Psychiatric/Behavioral:  Negative for depression. The patient is not nervous/anxious.       PHYSICAL EXAMINATION Patient sounds well.  In no apparent distress.  Mood and behavior are normal.  Speech is normal.   LABORATORY DATA:  CBC    Component Value Date/Time   WBC 4.2 06/30/2023 0947   WBC 2.5 (L) 05/08/2023 1529   RBC 4.40 06/30/2023 0947   HGB 13.4 06/30/2023 0947   HGB 14.7 01/14/2020 1309   HCT 40.9 06/30/2023 0947   HCT 47.1 (H) 01/14/2020 1309   PLT 64 (L) 06/30/2023 0947   PLT 64 (L) 01/14/2020 1309   MCV 93.0 06/30/2023 0947   MCV 100.9 (H) 01/14/2020 1309   MCH 30.5 06/30/2023 0947   MCHC 32.8 06/30/2023 0947   RDW 13.5 06/30/2023 0947   RDW 13.5 01/14/2020 1309   LYMPHSABS 0.8 06/30/2023 0947   LYMPHSABS 1.3 01/14/2020 1309   MONOABS 0.2 06/30/2023 0947   EOSABS 0.2 06/30/2023 0947   EOSABS 0.1 01/14/2020 1309  BASOSABS  0.0 06/30/2023 0947   BASOSABS 0.0 01/14/2020 1309     ASSESSMENT and THERAPY PLAN:   IDA (iron deficiency anemia) Secondary to decreased absorption from prior bypass surgery. Last Monoferric given in September 2024 with good tolerance. Ferritin levels are stable and patient has no symptoms. No Monoferric needed at this time Repeat lab testing in 12 weeks or sooner if clinically indicated.  Thrombocytopenia (HCC) Due to cirrhosis of the liver and splenomegaly Platelet count remains stable Previous work-up was negative hepatitis B and C.  Hepatitis A showed seropositivity We will continue to monitor.  If patient notices increased easy bruising bleeding she knows to let us know.  Follow up instructions:    -Return to cancer center in 12 weeks for labs and follow-up  The patient was provided an opportunity to ask questions and all were answered. The patient agreed with the plan and demonstrated an understanding of the instructions.   The patient was advised to call back or seek an in-person evaluation if the symptoms worsen or if the condition fails to improve as anticipated.   I provided 10 minutes of non face-to-face telephone visit time during this encounter, and > 50% was spent counseling as documented under my assessment & plan.   Lillard Anes, NP 07/03/23 8:17 AM Medical Oncology and Hematology [facility] [address] [place], [place] [address] Tel. [phone]    Fax. [phone]  *Total Encounter Time as defined by the Centers for Medicare and Medicaid Services includes, in addition to the face-to-face time of a patient visit (documented in the note above) non-face-to-face time: obtaining and reviewing outside history, ordering and reviewing medications, tests or procedures, care coordination (communications with other health care professionals or caregivers) and documentation in the medical record.

## 2023-07-01 NOTE — Addendum Note (Signed)
 Addended by: Jackqulyn Masse on: 07/01/2023 08:53 AM   Modules accepted: Orders

## 2023-07-02 ENCOUNTER — Other Ambulatory Visit (HOSPITAL_COMMUNITY): Payer: Self-pay

## 2023-07-02 ENCOUNTER — Encounter: Payer: Self-pay | Admitting: Hematology and Oncology

## 2023-07-02 ENCOUNTER — Other Ambulatory Visit: Payer: Self-pay

## 2023-07-03 ENCOUNTER — Encounter: Payer: Self-pay | Admitting: Hematology and Oncology

## 2023-07-03 ENCOUNTER — Other Ambulatory Visit (HOSPITAL_COMMUNITY): Payer: Self-pay

## 2023-07-03 NOTE — Assessment & Plan Note (Signed)
Due to cirrhosis of the liver and splenomegaly Platelet count remains stable Previous work-up was negative hepatitis B and C.  Hepatitis A showed seropositivity We will continue to monitor.  If patient notices increased easy bruising bleeding she knows to let us know.

## 2023-07-03 NOTE — Assessment & Plan Note (Signed)
Secondary to decreased absorption from prior bypass surgery. Last Monoferric given in September 2024 with good tolerance. Ferritin levels are stable and patient has no symptoms. No Monoferric needed at this time Repeat lab testing in 12 weeks or sooner if clinically indicated.

## 2023-07-06 ENCOUNTER — Other Ambulatory Visit: Payer: Self-pay

## 2023-07-06 ENCOUNTER — Other Ambulatory Visit (HOSPITAL_COMMUNITY): Payer: Self-pay

## 2023-07-07 ENCOUNTER — Other Ambulatory Visit (HOSPITAL_COMMUNITY): Payer: Self-pay

## 2023-07-07 ENCOUNTER — Other Ambulatory Visit: Payer: Self-pay

## 2023-07-08 ENCOUNTER — Ambulatory Visit: Payer: Medicaid Other | Admitting: Family

## 2023-07-10 ENCOUNTER — Other Ambulatory Visit (HOSPITAL_COMMUNITY): Payer: Self-pay

## 2023-07-10 ENCOUNTER — Other Ambulatory Visit: Payer: Self-pay

## 2023-07-11 ENCOUNTER — Other Ambulatory Visit (HOSPITAL_COMMUNITY): Payer: Self-pay

## 2023-07-13 ENCOUNTER — Other Ambulatory Visit (HOSPITAL_COMMUNITY): Payer: Self-pay

## 2023-07-14 ENCOUNTER — Other Ambulatory Visit (HOSPITAL_COMMUNITY): Payer: Self-pay

## 2023-07-16 ENCOUNTER — Ambulatory Visit: Payer: Medicaid Other | Admitting: Family Medicine

## 2023-07-17 ENCOUNTER — Other Ambulatory Visit (HOSPITAL_COMMUNITY): Payer: Self-pay

## 2023-07-21 ENCOUNTER — Other Ambulatory Visit (HOSPITAL_COMMUNITY): Payer: Self-pay

## 2023-07-23 ENCOUNTER — Other Ambulatory Visit: Payer: Self-pay | Admitting: Internal Medicine

## 2023-07-23 ENCOUNTER — Other Ambulatory Visit: Payer: Self-pay

## 2023-07-23 ENCOUNTER — Other Ambulatory Visit (HOSPITAL_COMMUNITY): Payer: Self-pay

## 2023-07-23 MED ORDER — PROMETHAZINE HCL 25 MG PO TABS
25.0000 mg | ORAL_TABLET | Freq: Four times a day (QID) | ORAL | 1 refills | Status: DC | PRN
  Filled 2023-07-23 – 2023-08-09 (×3): qty 120, 30d supply, fill #0
  Filled 2023-08-25 – 2023-08-26 (×2): qty 120, 30d supply, fill #1

## 2023-07-24 ENCOUNTER — Other Ambulatory Visit: Payer: Self-pay

## 2023-07-27 ENCOUNTER — Other Ambulatory Visit: Payer: Self-pay

## 2023-07-27 ENCOUNTER — Encounter (HOSPITAL_COMMUNITY): Payer: Self-pay

## 2023-07-27 ENCOUNTER — Ambulatory Visit (INDEPENDENT_AMBULATORY_CARE_PROVIDER_SITE_OTHER): Payer: Medicaid Other | Admitting: Mental Health

## 2023-07-27 ENCOUNTER — Other Ambulatory Visit (HOSPITAL_COMMUNITY): Payer: Self-pay

## 2023-07-27 DIAGNOSIS — F3181 Bipolar II disorder: Secondary | ICD-10-CM

## 2023-07-27 DIAGNOSIS — F4310 Post-traumatic stress disorder, unspecified: Secondary | ICD-10-CM

## 2023-07-27 DIAGNOSIS — F411 Generalized anxiety disorder: Secondary | ICD-10-CM

## 2023-07-27 NOTE — Progress Notes (Signed)
 THERAPIST PROGRESS NOTE Virtual Visit via Video Note  I connected with Linda Becker on 07/27/23 at  8:00 AM EST by a video enabled telemedicine application and verified that I am speaking with the correct person using two identifiers.  Location: Patient: home address on file Provider: home office   I discussed the limitations of evaluation and management by telemedicine and the availability of in person appointments. The patient expressed understanding and agreed to proceed.  I discussed the assessment and treatment plan with the patient. The patient was provided an opportunity to ask questions and all were answered. The patient agreed with the plan and demonstrated an understanding of the instructions.   The patient was advised to call back or seek an in-person evaluation if the symptoms worsen or if the condition fails to improve as anticipated.  I provided 47 minutes of non-face-to-face time during this encounter.   Loman Risk, [facility]   Session Time: 8:06 am( 47 minutes)  Participation Level: Active  Behavioral Response: CasualAlertDysphoric  Type of Therapy: Individual Therapy  Treatment Goals addressed: STG: "Personal Growth" Linda Becker will increase management of moods AEB x 3 effective coping skills for moods with ability to process thoughts in balanced manner per self report within the next 90 days.   Linda Becker will increase level of functioning AEB exploration of hobbies and daily routine with ability to engage in community x 1 weekly within the next 90 days.   ProgressTowards Goals: Initial  Interventions: Supportive  Summary: Linda Becker is a 33 y.o. female who presents with dx of Bipolar disorder, PTSD and generalized anxiety. Presents alert and oriented mood and affect adequate; slightly dysphoric. Speech clear and coherent at normal rate and tone. Engaged and receptive. Shares for moods to have been more stable however also feel as if she has  been more "up and down." Shares thoughts on holidays and being difficulty with anniversary of mothers passing. Shares with therapist history of interactions with husband and others and noting to have "burned a lot of bridges" Shares thoughts on friendships and distancing her self. Shares would like independence and stability and would like to work towards "personal growth" Shares difficulty with avoidance and procrastination. Shares to spend high degree of time in basement of parents home where she lives, minimal interactions with others, however sees husband x 1 weekly. Notes to have made list of hobbies and agrees to explore working to engage in life to higher degree noting to spend a lot of time "doom scrolling." Agrees to treatment plan. Denies Si/HI    Suicidal/Homicidal: Nowithout intent/plan  Therapist Response: Therapist engaged Linda Becker in tele-therapy session. Completed check in and assessed for current level of sxs, level of functioning and sxs management. Reviewed intake assessment and informed consent and confidentiality bounds. Provided safe space for Sandrea Cruel to share thoughts and feelings in regards to current level of functioning, active empathic listening. Providing support and encouragement; validated feelings. Affirmed ability to wrk to start to take accountability and identify role she has played in past events and relationships with others. Engaged in treatment planning and explored what progress would look like for her. Reviewed session and provided follow up. No safety concerns reported.   Plan: Return again in  x 5 weeks.  Diagnosis: Bipolar 2 disorder (HCC)  PTSD (post-traumatic stress disorder)  Generalized anxiety disorder  Collaboration of Care: Other None  Patient/Guardian was advised Release of Information must be obtained prior to any record release in order to collaborate their  care with an outside provider. Patient/Guardian was advised if they have not already done  so to contact the registration department to sign all necessary forms in order for us  to release information regarding their care.   Consent: Patient/Guardian gives verbal consent for treatment and assignment of benefits for services provided during this visit. Patient/Guardian expressed understanding and agreed to proceed.   Carmel Chimes [place], [facility] 07/27/2023

## 2023-07-28 ENCOUNTER — Other Ambulatory Visit (HOSPITAL_COMMUNITY): Payer: Self-pay

## 2023-07-29 ENCOUNTER — Other Ambulatory Visit (HOSPITAL_COMMUNITY): Payer: Self-pay

## 2023-08-06 ENCOUNTER — Other Ambulatory Visit (HOSPITAL_COMMUNITY): Payer: Self-pay

## 2023-08-09 ENCOUNTER — Other Ambulatory Visit (HOSPITAL_BASED_OUTPATIENT_CLINIC_OR_DEPARTMENT_OTHER): Payer: Self-pay

## 2023-08-10 ENCOUNTER — Emergency Department (HOSPITAL_COMMUNITY): Payer: Medicaid Other

## 2023-08-10 ENCOUNTER — Encounter (HOSPITAL_COMMUNITY): Payer: Self-pay

## 2023-08-10 ENCOUNTER — Other Ambulatory Visit (HOSPITAL_BASED_OUTPATIENT_CLINIC_OR_DEPARTMENT_OTHER): Payer: Self-pay

## 2023-08-10 ENCOUNTER — Other Ambulatory Visit: Payer: Self-pay

## 2023-08-10 ENCOUNTER — Emergency Department (HOSPITAL_COMMUNITY)
Admission: EM | Admit: 2023-08-10 | Discharge: 2023-08-10 | Disposition: A | Discharge status: Home / Self Care | Payer: Medicaid Other | Attending: Emergency Medicine | Admitting: Emergency Medicine

## 2023-08-10 DIAGNOSIS — Z79899 Other long term (current) drug therapy: Secondary | ICD-10-CM | POA: Insufficient documentation

## 2023-08-10 DIAGNOSIS — D72819 Decreased white blood cell count, unspecified: Secondary | ICD-10-CM | POA: Diagnosis not present

## 2023-08-10 DIAGNOSIS — R519 Headache, unspecified: Secondary | ICD-10-CM | POA: Diagnosis present

## 2023-08-10 DIAGNOSIS — J45909 Unspecified asthma, uncomplicated: Secondary | ICD-10-CM | POA: Insufficient documentation

## 2023-08-10 LAB — RESP PANEL BY RT-PCR (RSV, FLU A&B, COVID)  RVPGX2
Influenza A by PCR: NEGATIVE
Influenza B by PCR: NEGATIVE
Resp Syncytial Virus by PCR: NEGATIVE
SARS Coronavirus 2 by RT PCR: NEGATIVE

## 2023-08-10 LAB — COMPREHENSIVE METABOLIC PANEL
ALT: 15 U/L (ref 0–44)
AST: 18 U/L (ref 15–41)
Albumin: 4 g/dL (ref 3.5–5.0)
Alkaline Phosphatase: 62 U/L (ref 38–126)
Anion gap: 7 (ref 5–15)
BUN: 11 mg/dL (ref 6–20)
CO2: 24 mmol/L (ref 22–32)
Calcium: 8.9 mg/dL (ref 8.9–10.3)
Chloride: 109 mmol/L (ref 98–111)
Creatinine, Ser: 0.68 mg/dL (ref 0.44–1.00)
GFR, Estimated: >60 mL/min (ref >60)
Glucose, Bld: 111 mg/dL — ABNORMAL HIGH (ref 70–99)
Potassium: 5 mmol/L (ref 3.5–5.1)
Sodium: 140 mmol/L (ref 135–145)
Total Bilirubin: 0.8 mg/dL (ref 0.0–1.2)
Total Protein: 6.7 g/dL (ref 6.5–8.1)

## 2023-08-10 LAB — CBC WITH DIFFERENTIAL/PLATELET
Abs Immature Granulocytes: 0.01 10*3/uL (ref 0.00–0.07)
Basophils Absolute: 0 10*3/uL (ref 0.0–0.1)
Basophils Relative: 1 %
Eosinophils Absolute: 0.3 10*3/uL (ref 0.0–0.5)
Eosinophils Relative: 8 %
HCT: 43.3 % (ref 36.0–46.0)
Hemoglobin: 13.4 g/dL (ref 12.0–15.0)
Immature Granulocytes: 0 %
Lymphocytes Relative: 30 %
Lymphs Abs: 1.1 10*3/uL (ref 0.7–4.0)
MCH: 30 pg (ref 26.0–34.0)
MCHC: 30.9 g/dL (ref 30.0–36.0)
MCV: 96.9 fL (ref 80.0–100.0)
Monocytes Absolute: 0.2 10*3/uL (ref 0.1–1.0)
Monocytes Relative: 6 %
Neutro Abs: 2 10*3/uL (ref 1.7–7.7)
Neutrophils Relative %: 55 %
Platelets: 70 10*3/uL — ABNORMAL LOW (ref 150–400)
RBC: 4.47 MIL/uL (ref 3.87–5.11)
RDW: 13.2 % (ref 11.5–15.5)
WBC: 3.7 10*3/uL — ABNORMAL LOW (ref 4.0–10.5)
nRBC: 0 % (ref 0.0–0.2)

## 2023-08-10 LAB — PREGNANCY, URINE: Preg Test, Ur: NEGATIVE

## 2023-08-10 MED ORDER — KETOROLAC TROMETHAMINE 15 MG/ML IJ SOLN
15.0000 mg | Freq: Once | INTRAMUSCULAR | Status: AC
  Administered 2023-08-10: 15 mg via INTRAVENOUS
  Filled 2023-08-10: qty 1

## 2023-08-10 MED ORDER — PROMETHAZINE HCL 25 MG PO TABS: 25.0000 mg | ORAL_TABLET | Freq: Once | ORAL | Status: DC

## 2023-08-10 MED ORDER — MAGNESIUM SULFATE 2 GM/50ML IV SOLN
2.0000 g | Freq: Once | INTRAVENOUS | Status: AC
  Administered 2023-08-10: 2 g via INTRAVENOUS
  Filled 2023-08-10: qty 50

## 2023-08-10 MED ORDER — SODIUM CHLORIDE 0.9 % IV BOLUS
1000.0000 mL | Freq: Once | INTRAVENOUS | Status: AC
  Administered 2023-08-10: 1000 mL via INTRAVENOUS

## 2023-08-10 MED ORDER — PROCHLORPERAZINE EDISYLATE 10 MG/2ML IJ SOLN
10.0000 mg | Freq: Once | INTRAMUSCULAR | Status: AC
  Administered 2023-08-10: 10 mg via INTRAVENOUS
  Filled 2023-08-10: qty 2

## 2023-08-10 MED ORDER — URSODIOL 500 MG PO TABS
500.0000 mg | ORAL_TABLET | Freq: Three times a day (TID) | ORAL | 11 refills | Status: AC
  Filled 2023-08-10: qty 90, 30d supply, fill #0

## 2023-08-10 MED ORDER — DIPHENHYDRAMINE HCL 50 MG/ML IJ SOLN
25.0000 mg | Freq: Once | INTRAMUSCULAR | Status: AC
  Administered 2023-08-10: 25 mg via INTRAVENOUS
  Filled 2023-08-10: qty 1

## 2023-08-10 NOTE — ED Provider Notes (Signed)
 [facility] EMERGENCY DEPARTMENT AT [facility] Provider Note   CSN: [id number] Arrival date & time: 08/10/23  1204     History  Chief Complaint  Patient presents with   Headache    Linda Becker is a 33 y.o. female  Pt complains of headache, malaise, neck stiffness, recent viral illness. Sent by PCP triage line due to concern for meningitis, but no clinical signs or symptoms in ED. Does endorse some nausea. No confusion, no weakness. Hx of liver disease, thrombocytopenia. No hx of recent caving, immunosuppression, previous cancer.    Headache      Home Medications Prior to Admission medications   Medication Sig Start Date End Date Taking? Authorizing Provider  albuterol (PROVENTIL) (2.5 MG/3ML) 0.083% nebulizer solution Take 3 mLs (2.5 mg total) by nebulization every 4 (four) hours as needed for wheezing or shortness of breath (coughing fits). 06/30/23   Kozlow, Alvira Philips, MD  albuterol (VENTOLIN HFA) 108 (90 Base) MCG/ACT inhaler Inhale 2 puffs into the lungs every 4 (four) hours as needed for wheezing or shortness of breath (coughing fits). 06/30/23   Kozlow, Alvira Philips, MD  Ascorbic Acid (VITAMIN C) 1000 MG tablet Take 2,000 mg by mouth 2 (two) times daily.    [provider]  azelastine (ASTELIN) 0.1 % nasal spray Place 1-2 sprays into both nostrils 2 (two) times daily as needed (nasal drainage). Use in each nostril as directed 06/03/23   Ellamae Sia, DO  Budeson-Glycopyrrol-Formoterol (BREZTRI AEROSPHERE) 160-9-4.8 MCG/ACT AERO Inhale 2 puffs into the lungs in the morning and at bedtime. with spacer and rinse mouth afterwards. 06/30/23   Kozlow, Alvira Philips, MD  CALCIUM PO Take 1 tablet by mouth 2 (two) times daily.    [provider]  cariprazine (VRAYLAR) 3 MG capsule Take 1 capsule (3 mg total) by mouth daily. 07/01/23   Nwoko, Tommas Olp, PA  carvedilol (COREG) 12.5 MG tablet Take 1 tablet (12.5 mg total) by mouth 2 (two) times daily with a meal. 04/13/23    Pyrtle, Carie Caddy, MD  cetirizine (ZYRTEC) 10 MG tablet Take 1 tablet (10 mg total) by mouth 2 (two) times daily. 06/30/23   Kozlow, Alvira Philips, MD  desonide (DESOWEN) 0.05 % ointment Apply 1 Application topically 2 (two) times daily. Apply to the face 06/30/23   Jessica Priest, MD  diphenhydrAMINE (BENADRYL) 25 MG tablet Take 50 mg by mouth at bedtime as needed for allergies or sleep.    [provider]  ergocalciferol (VITAMIN D2) 1.25 MG (50000 UT) capsule Take 1 capsule (50,000 Units total) by mouth once a week. Patient taking differently: Take 50,000 Units by mouth every Monday. 10/08/22     escitalopram (LEXAPRO) 10 MG tablet Take 1 tablet (10 mg total) by mouth daily. 07/01/23   Nwoko, Tommas Olp, PA  famotidine (PEPCID) 40 MG tablet Take 1 tablet (40 mg total) by mouth 2 (two) times daily. Pt need an office visit for further refills. 06/30/23   Kozlow, Alvira Philips, MD  fluconazole (DIFLUCAN) 150 MG tablet Take 1 tablet (150 mg total) by mouth every three (3) days as needed. Patient taking differently: Take 150 mg by mouth every three (3) days as needed (For yeast infection). 02/21/23   Junie Spencer, FNP  gabapentin (NEURONTIN) 400 MG capsule Take 1 capsule (400 mg total) by mouth 3 (three) times daily. 07/01/23   Nwoko, Tommas Olp, PA  lamoTRIgine (LAMICTAL) 25 MG tablet Take 1 tablet (25 mg total) by  mouth daily for 14 days, THEN 2 tablets (50 mg total) daily. 07/01/23 07/14/24  Nwoko, Tommas Olp, PA  mometasone (ELOCON) 0.1 % ointment Apply topically 2 (two) times daily. Apply to the body 06/30/23   Kozlow, Alvira Philips, MD  Na Sulfate-K Sulfate-Mg Sulf 17.5-3.13-1.6 GM/177ML SOLN Take as Directed 03/30/23   Beverley Fiedler, MD  naphazoline-pheniramine (EYE ALLERGY RELIEF) 0.025-0.3 % ophthalmic solution Place 2 drops into both eyes 4 (four) times daily as needed for allergies.    [provider]  Prenatal Vit-Fe Fumarate-FA (MULTIVITAMIN-PRENATAL) 27-0.8 MG TABS tablet Take 1 tablet by mouth 2 (two)  times daily.    [provider]  promethazine (PHENERGAN) 25 MG tablet Take 1 tablet (25 mg total) by mouth every 6 (six) hours as needed. 07/23/23   Pyrtle, Carie Caddy, MD  Respiratory Therapy Supplies (NEBULIZER MASK ADULT) MISC Use as directed 06/30/23   Kozlow, Alvira Philips, MD  traZODone (DESYREL) 50 MG tablet Take 1 tablet (50 mg total) by mouth at bedtime. 07/01/23   Nwoko, Tommas Olp, PA  ursodiol (ACTIGALL) 500 MG tablet Take 1 tablet (500 mg total) by mouth 3 (three) times daily 08/10/23     Vonoprazan Fumarate (VOQUEZNA) 20 MG TABS Take 1 tablet by mouth daily. 06/19/23   Pyrtle, Carie Caddy, MD  XIFAXAN 550 MG TABS tablet Take 1 tablet (550 mg total) by mouth 2 (two) times daily. 04/07/22         Allergies    Progesterone and Penicillins    Review of Systems   Review of Systems  Neurological:  Positive for headaches.  All other systems reviewed and are negative.   Physical Exam Updated Vital Signs BP 111/74   Pulse 99   Temp 98.3 F (36.8 C) (Oral)   Resp 16   Ht 5\' 6"  (1.676 m)   Wt 86.2 kg   LMP 07/27/2023   SpO2 97%   BMI 30.67 kg/m  Physical Exam Vitals and nursing note reviewed.  Constitutional:      General: She is not in acute distress.    Appearance: Normal appearance.  HENT:     Head: Normocephalic and atraumatic.  Eyes:     General:        Right eye: No discharge.        Left eye: No discharge.  Neck:     Meningeal: Brudzinski's sign and Kernig's sign absent.     Comments: Some TTP of cervical paraspinous muscles, no step off, deformity, no rigidity and normal ROM throughout Cardiovascular:     Rate and Rhythm: Normal rate and regular rhythm.     Heart sounds: No murmur heard.    No friction rub. No gallop.  Pulmonary:     Effort: Pulmonary effort is normal.     Breath sounds: Normal breath sounds.  Abdominal:     General: Bowel sounds are normal.     Palpations: Abdomen is soft.  Skin:    General: Skin is warm and dry.     Capillary Refill: Capillary  refill takes less than 2 seconds.  Neurological:     Mental Status: She is alert and oriented to person, place, and time.  Psychiatric:        Mood and Affect: Mood normal.        Behavior: Behavior normal.     Comments: Cranial nerves II through XII grossly intact.  Intact finger-nose, intact heel-to-shin.  Romberg negative, gait normal.  Alert and oriented x3.  Moves all  4 limbs spontaneously, normal coordination.  No pronator drift.  Intact strength 5 out of 5 bilateral upper and lower extremities.       ED Results / Procedures / Treatments   Labs (all labs ordered are listed, but only abnormal results are displayed) Labs Reviewed  CBC WITH DIFFERENTIAL/PLATELET - Abnormal; Notable for the following components:      Result Value   WBC 3.7 (*)    Platelets 70 (*)    All other components within normal limits  COMPREHENSIVE METABOLIC PANEL - Abnormal; Notable for the following components:   Glucose, Bld 111 (*)    All other components within normal limits  RESP PANEL BY RT-PCR (RSV, FLU A&B, COVID)  RVPGX2  PREGNANCY, URINE    EKG None  Radiology CT Head Wo Contrast Result Date: 08/10/2023 CLINICAL DATA:  Headache and fever. Neck stiffness. Photosensitivity. EXAM: CT HEAD WITHOUT CONTRAST TECHNIQUE: Contiguous axial images were obtained from the base of the skull through the vertex without intravenous contrast. RADIATION DOSE REDUCTION: This exam was performed according to the departmental dose-optimization program which includes automated exposure control, adjustment of the mA and/or kV according to patient size and/or use of iterative reconstruction technique. COMPARISON:  None Available. FINDINGS: The study is motion degraded, mildly to moderately so through the skull base. Brain: There is no evidence of an acute infarct, intracranial hemorrhage, mass, midline shift, or extra-axial fluid collection. Cerebral volume is normal. The ventricles are normal in size. Vascular: No  hyperdense vessel. Skull: No acute fracture or suspicious lesion. Sinuses/Orbits: Included paranasal sinuses and mastoid air cells are clear. Unremarkable orbits. Other: None. IMPRESSION: No evidence of acute intracranial abnormality. Electronically Signed   By: Sebastian Ache M.D.   On: 08/10/2023 13:45    Procedures Procedures    Medications Ordered in ED Medications  prochlorperazine (COMPAZINE) injection 10 mg (10 mg Intravenous Given 08/10/23 1738)  diphenhydrAMINE (BENADRYL) injection 25 mg (25 mg Intravenous Given 08/10/23 1738)  sodium chloride 0.9 % bolus 1,000 mL (1,000 mLs Intravenous New Bag/Given 08/10/23 1737)  ketorolac (TORADOL) 15 MG/ML injection 15 mg (15 mg Intravenous Given 08/10/23 1738)  magnesium sulfate IVPB 2 g 50 mL (2 g Intravenous New Bag/Given 08/10/23 1739)    ED Course/ Medical Decision Making/ A&P                                 Medical Decision Making Amount and/or Complexity of Data Reviewed Labs: ordered. Radiology: ordered.  Risk Prescription drug management.   This patient is a 33 y.o. female  who presents to the ED for concern of headache, malaise, nausea.   Differential diagnoses prior to evaluation: The emergent differential diagnosis includes, but is not limited to,  Stroke, increased ICP, meningitis, CVA, intracranial tumor, venous sinus thrombosis, migraine, cluster headache, hypertension, drug related, head injury, tension headache, sinusitis, dental abscess, otitis media, TMJ  . This is not an exhaustive differential.   Past Medical History / Co-morbidities / Social History: Migraines, GERD, asthma, bipolar  Physical Exam: Physical exam performed. The pertinent findings include: No focal neurologic deficits, no evidence of meningismus, some tenderness to palpation of cervical paraspinous muscles.  Vital signs otherwise stable in the ED.  Lab Tests/Imaging studies: I personally interpreted labs/imaging and the pertinent results include:  CMP overall unremarkable, CBC notable for mild leukocytopenia, white blood cell 3.7, platelets 70, these are stable compared to her baseline, she has some liver  disease at baseline.  RVP negative for COVID, flu, RSV, pregnancy test negative.  I independently interpreted CT head without contrast which shows no evidence of acute intracranial abnormality.. I agree with the radiologist interpretation.   Medications: I ordered medication including migraine cocktail.  I have reviewed the patients home medicines and have made adjustments as needed.  Patient feeling better after medication administration   Disposition: After consideration of the diagnostic results and the patients response to treatment, I feel that patient stable for discharge at this time, headache resolved, no worrisome neurologic symptoms .   emergency department workup does not suggest an emergent condition requiring admission or immediate intervention beyond what has been performed at this time. The plan is: as above. The patient is safe for discharge and has been instructed to return immediately for worsening symptoms, change in symptoms or any other concerns.  Final Clinical Impression(s) / ED Diagnoses Final diagnoses:  Acute nonintractable headache, unspecified headache type    Rx / DC Orders ED Discharge Orders     None         [place] 08/10/23 Reginal Lutes, MD 08/10/23 2214

## 2023-08-10 NOTE — ED Triage Notes (Signed)
 Patient was tested earlier for covid, flu, strep, all were negative. Her doctor sent her over for possible meningitis. Has had a headache since Friday. Constant runny nose, high fever, neck is stiff, sore throat. Is sensitive to light.

## 2023-08-10 NOTE — Discharge Instructions (Signed)
 Please follow-up with your primary care doctor, I did not see any evidence of a acute abnormality, meningitis today.  I am glad that your headache is feeling better and I hope it continues to.

## 2023-08-11 ENCOUNTER — Other Ambulatory Visit: Payer: Self-pay

## 2023-08-12 ENCOUNTER — Other Ambulatory Visit (HOSPITAL_BASED_OUTPATIENT_CLINIC_OR_DEPARTMENT_OTHER): Payer: Self-pay

## 2023-08-12 ENCOUNTER — Other Ambulatory Visit (HOSPITAL_COMMUNITY): Payer: Self-pay | Admitting: Physician Assistant

## 2023-08-12 DIAGNOSIS — F3181 Bipolar II disorder: Secondary | ICD-10-CM

## 2023-08-12 DIAGNOSIS — F411 Generalized anxiety disorder: Secondary | ICD-10-CM

## 2023-08-15 ENCOUNTER — Encounter: Payer: Self-pay | Admitting: Hematology and Oncology

## 2023-08-19 ENCOUNTER — Encounter: Payer: Self-pay | Admitting: Hematology and Oncology

## 2023-08-19 ENCOUNTER — Encounter (HOSPITAL_COMMUNITY): Payer: Self-pay

## 2023-08-19 ENCOUNTER — Telehealth (HOSPITAL_COMMUNITY): Payer: Medicaid Other | Admitting: Physician Assistant

## 2023-08-22 ENCOUNTER — Other Ambulatory Visit (HOSPITAL_COMMUNITY): Payer: Self-pay | Admitting: Physician Assistant

## 2023-08-22 DIAGNOSIS — F3181 Bipolar II disorder: Secondary | ICD-10-CM

## 2023-08-22 DIAGNOSIS — F411 Generalized anxiety disorder: Secondary | ICD-10-CM

## 2023-08-24 ENCOUNTER — Other Ambulatory Visit: Payer: Self-pay | Admitting: Internal Medicine

## 2023-08-25 ENCOUNTER — Other Ambulatory Visit (HOSPITAL_BASED_OUTPATIENT_CLINIC_OR_DEPARTMENT_OTHER): Payer: Self-pay

## 2023-08-26 ENCOUNTER — Encounter: Payer: Self-pay | Admitting: Hematology and Oncology

## 2023-08-26 ENCOUNTER — Other Ambulatory Visit (HOSPITAL_BASED_OUTPATIENT_CLINIC_OR_DEPARTMENT_OTHER): Payer: Self-pay

## 2023-08-27 ENCOUNTER — Other Ambulatory Visit (HOSPITAL_BASED_OUTPATIENT_CLINIC_OR_DEPARTMENT_OTHER): Payer: Self-pay

## 2023-09-06 ENCOUNTER — Other Ambulatory Visit: Payer: Self-pay | Admitting: Internal Medicine

## 2023-09-07 ENCOUNTER — Encounter: Payer: Self-pay | Admitting: Hematology and Oncology

## 2023-09-07 ENCOUNTER — Telehealth (HOSPITAL_COMMUNITY): Payer: Self-pay

## 2023-09-07 ENCOUNTER — Other Ambulatory Visit (HOSPITAL_BASED_OUTPATIENT_CLINIC_OR_DEPARTMENT_OTHER): Payer: Self-pay

## 2023-09-07 ENCOUNTER — Other Ambulatory Visit: Payer: Self-pay

## 2023-09-07 ENCOUNTER — Other Ambulatory Visit: Payer: Self-pay | Admitting: Internal Medicine

## 2023-09-07 MED ORDER — PROMETHAZINE HCL 25 MG PO TABS
25.0000 mg | ORAL_TABLET | Freq: Four times a day (QID) | ORAL | 1 refills | Status: DC | PRN
  Filled 2023-09-07 – 2023-09-18 (×4): qty 120, 30d supply, fill #0
  Filled 2023-10-07 – 2023-10-15 (×2): qty 120, 30d supply, fill #1

## 2023-09-07 NOTE — Telephone Encounter (Signed)
 Hello,       Pt states that she needs a refill on her Psych meds and wants to try to reschedule to make an appointment and Gabapentin and Trazodone. Sent Pt to the front for another APP.

## 2023-09-08 ENCOUNTER — Telehealth (HOSPITAL_COMMUNITY): Payer: Self-pay | Admitting: *Deleted

## 2023-09-08 ENCOUNTER — Other Ambulatory Visit (HOSPITAL_COMMUNITY): Payer: Self-pay | Admitting: Physician Assistant

## 2023-09-08 ENCOUNTER — Other Ambulatory Visit: Payer: Self-pay

## 2023-09-08 ENCOUNTER — Other Ambulatory Visit (HOSPITAL_BASED_OUTPATIENT_CLINIC_OR_DEPARTMENT_OTHER): Payer: Self-pay

## 2023-09-08 ENCOUNTER — Other Ambulatory Visit (HOSPITAL_COMMUNITY): Payer: Self-pay

## 2023-09-08 DIAGNOSIS — F3181 Bipolar II disorder: Secondary | ICD-10-CM

## 2023-09-08 DIAGNOSIS — F411 Generalized anxiety disorder: Secondary | ICD-10-CM

## 2023-09-08 NOTE — Telephone Encounter (Signed)
 Patient called asking for refills of Trazodone and Gabapentin. Next appointment 4/1.

## 2023-09-09 ENCOUNTER — Telehealth: Payer: Self-pay | Admitting: Hematology and Oncology

## 2023-09-09 ENCOUNTER — Inpatient Hospital Stay

## 2023-09-09 ENCOUNTER — Inpatient Hospital Stay: Payer: Self-pay | Attending: Hematology and Oncology | Admitting: Hematology and Oncology

## 2023-09-09 ENCOUNTER — Other Ambulatory Visit (HOSPITAL_COMMUNITY): Payer: Self-pay

## 2023-09-09 ENCOUNTER — Ambulatory Visit (HOSPITAL_COMMUNITY): Payer: Medicaid Other | Admitting: Mental Health

## 2023-09-09 ENCOUNTER — Other Ambulatory Visit (HOSPITAL_BASED_OUTPATIENT_CLINIC_OR_DEPARTMENT_OTHER): Payer: Self-pay

## 2023-09-09 ENCOUNTER — Other Ambulatory Visit (HOSPITAL_COMMUNITY): Payer: Self-pay | Admitting: Physician Assistant

## 2023-09-09 ENCOUNTER — Other Ambulatory Visit: Payer: Self-pay

## 2023-09-09 ENCOUNTER — Inpatient Hospital Stay: Payer: Self-pay

## 2023-09-09 VITALS — BP 121/49 | HR 90 | Temp 98.8°F | Resp 18 | Ht 66.0 in | Wt 191.4 lb

## 2023-09-09 DIAGNOSIS — F3181 Bipolar II disorder: Secondary | ICD-10-CM

## 2023-09-09 DIAGNOSIS — D696 Thrombocytopenia, unspecified: Secondary | ICD-10-CM | POA: Diagnosis present

## 2023-09-09 DIAGNOSIS — R5383 Other fatigue: Secondary | ICD-10-CM | POA: Insufficient documentation

## 2023-09-09 DIAGNOSIS — D649 Anemia, unspecified: Secondary | ICD-10-CM | POA: Diagnosis not present

## 2023-09-09 DIAGNOSIS — D709 Neutropenia, unspecified: Secondary | ICD-10-CM | POA: Insufficient documentation

## 2023-09-09 DIAGNOSIS — F4310 Post-traumatic stress disorder, unspecified: Secondary | ICD-10-CM

## 2023-09-09 DIAGNOSIS — F411 Generalized anxiety disorder: Secondary | ICD-10-CM

## 2023-09-09 DIAGNOSIS — D509 Iron deficiency anemia, unspecified: Secondary | ICD-10-CM

## 2023-09-09 DIAGNOSIS — F5105 Insomnia due to other mental disorder: Secondary | ICD-10-CM

## 2023-09-09 LAB — RETIC PANEL
Immature Retic Fract: 10.3 % (ref 2.3–15.9)
RBC.: 4.42 MIL/uL (ref 3.87–5.11)
Retic Count, Absolute: 63.2 10*3/uL (ref 19.0–186.0)
Retic Ct Pct: 1.4 % (ref 0.4–3.1)
Reticulocyte Hemoglobin: 32.7 pg (ref >27.9)

## 2023-09-09 LAB — CBC WITH DIFFERENTIAL (CANCER CENTER ONLY)
Abs Immature Granulocytes: 0 10*3/uL (ref 0.00–0.07)
Basophils Absolute: 0 10*3/uL (ref 0.0–0.1)
Basophils Relative: 1 %
Eosinophils Absolute: 0.2 10*3/uL (ref 0.0–0.5)
Eosinophils Relative: 6 %
HCT: 41.9 % (ref 36.0–46.0)
Hemoglobin: 13.3 g/dL (ref 12.0–15.0)
Immature Granulocytes: 0 %
Lymphocytes Relative: 32 %
Lymphs Abs: 0.9 10*3/uL (ref 0.7–4.0)
MCH: 30 pg (ref 26.0–34.0)
MCHC: 31.7 g/dL (ref 30.0–36.0)
MCV: 94.4 fL (ref 80.0–100.0)
Monocytes Absolute: 0.2 10*3/uL (ref 0.1–1.0)
Monocytes Relative: 8 %
Neutro Abs: 1.4 10*3/uL — ABNORMAL LOW (ref 1.7–7.7)
Neutrophils Relative %: 53 %
Platelet Count: 76 10*3/uL — ABNORMAL LOW (ref 150–400)
RBC: 4.44 MIL/uL (ref 3.87–5.11)
RDW: 13.3 % (ref 11.5–15.5)
WBC Count: 2.7 10*3/uL — ABNORMAL LOW (ref 4.0–10.5)
nRBC: 0 % (ref 0.0–0.2)

## 2023-09-09 LAB — IRON AND IRON BINDING CAPACITY (CC-WL,HP ONLY)
Iron: 57 ug/dL (ref 28–170)
Saturation Ratios: 13 % (ref 10.4–31.8)
TIBC: 447 ug/dL (ref 250–450)
UIBC: 390 ug/dL (ref 148–442)

## 2023-09-09 LAB — FERRITIN: Ferritin: 12 ng/mL (ref 11–307)

## 2023-09-09 LAB — VITAMIN B12: Vitamin B-12: 298 pg/mL (ref 180–914)

## 2023-09-09 MED ORDER — TRAZODONE HCL 50 MG PO TABS
50.0000 mg | ORAL_TABLET | Freq: Every day | ORAL | 0 refills | Status: DC
  Filled 2023-09-09 – 2023-09-15 (×2): qty 30, 30d supply, fill #0

## 2023-09-09 MED ORDER — GABAPENTIN 400 MG PO CAPS
400.0000 mg | ORAL_CAPSULE | Freq: Three times a day (TID) | ORAL | 0 refills | Status: DC
  Filled 2023-09-09 (×2): qty 60, 20d supply, fill #0

## 2023-09-09 NOTE — Progress Notes (Unsigned)
 THERAPIST PROGRESS NOTE Virtual Visit via Video Note  I connected with [name] on 09/09/23 at  8:00 AM EDT by a video enabled telemedicine application and verified that I am speaking with the correct person using two identifiers.  Location: Patient: home address on file Provider: office   I discussed the limitations of evaluation and management by telemedicine and the availability of in person appointments. The patient expressed understanding and agreed to proceed.   I discussed the assessment and treatment plan with the patient. The patient was provided an opportunity to ask questions and all were answered. The patient agreed with the plan and demonstrated an understanding of the instructions.   The patient was advised to call back or seek an in-person evaluation if the symptoms worsen or if the condition fails to improve as anticipated.  I provided 38 minutes of non-face-to-face time during this encounter.   Dorris Singh, [facility]   Session Time: 8:05 am ( 38 minutes)   Participation Level: Active  Behavioral Response: CasualAlertEuthymic  Type of Therapy: Individual Therapy  Treatment Goals addressed: STG: "Personal Growth" [place] will increase management of moods AEB x 3 effective coping skills for moods with ability to process thoughts in balanced manner per self report within the next 90 days.    [place] will increase level of functioning AEB exploration of hobbies and daily routine with ability to engage in community x 1 weekly within the next 90 days.   ProgressTowards Goals: Progressing  Interventions: Supportive  Summary: MARGRETTA ZAMORANO is a 33 y.o. female who presents with dx of Bipolar disorder, PTSD and generalized anxiety. Presents alert and oriented mood and affect adequate; euthymic. Speech clear and coherent at normal rate and tone. Engaged and receptive to interventions. Shares to have slept well and in good spirits. Shares to have  been able to purchase a car and excited at the prospect of being able to get out of the house as wanted however shares still has spent a large amount of time in basement. Shares to be talking to her ex-husband and shares thoughts in regards to relationship. Explores with therapist hx of care taking role and talking care of others and working to increase ability to engage in self-care and working towards increased quality of life and personal interest Notes would like to start to get back onto things in which she enjoyed with doing her make up, hair and engaging in community. Notes to have an appointment and need to end session early. Denies safety concerns. Ongoing work towards goals.  Suicidal/Homicidal: Nowithout intent/plan  Therapist Response:  Therapist engaged [place] in tele-therapy session. Completed check in and assessed for current level of sxs, level of functioning and sxs management. Provided safe space for Britanny to share thoughts and feelings in improvement in mood. Explored factors that have contributed to current mood. Supported in processing working to increase engagement in self care and ability to give self grace. 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 Reviewed session and provided follow up.   Plan: Return again in  x 4 weeks.  Diagnosis: Bipolar 2 disorder (HCC)  PTSD (post-traumatic stress disorder)  Generalized anxiety disorder  Collaboration of Care: Other None  Patient/Guardian was advised Release of Information must be obtained prior to any record release in order to collaborate their care with an outside provider. Patient/Guardian was advised if they have not already done so to contact the registration department to sign  all necessary forms in order for [place] to release information regarding their care.   Consent: Patient/Guardian gives verbal consent for treatment and assignment of benefits for services provided during this  visit. Patient/Guardian expressed understanding and agreed to proceed.   Stephan Minister [place], [facility] 09/09/2023

## 2023-09-09 NOTE — Telephone Encounter (Signed)
 Scheduled appointments per 3/26 los. Talked with the patient and she is aware of the made appointments.

## 2023-09-09 NOTE — Assessment & Plan Note (Signed)
 Due to cirrhosis of the liver and splenomegaly   Lab review: 02/04/2018: Platelets 135 07/27/2018: Platelet count 102 01/28/2020: Platelet count 53 02/07/2020: Platelet count 63 05/13/2022: Platelets 87 01/06/2023: Hemoglobin 7, MCV 77.4, platelets 71, WBC 2.6, iron saturation 2.8%, TIBC 459, ferritin 2.9 01/19/2023: Hemoglobin 7.4, MCV 86, WBC 2.1, platelets 71 (patient received 4 doses of IV iron so far) 03/02/2023: Hemoglobin 10.4, platelets 68, WBC 2.7, reticulocytes 1.2%, iron saturation 7%, B12 432, ferritin 8 08/10/23: Hb 13.4, Platelet 70   Previous work-up was negative hepatitis B and C. Hepatitis A showed seropositivity   Treatment plan: Recommended IV iron once again (will request Monoferric because she is a very hard IV stick) Prior gastric bypass surgery

## 2023-09-09 NOTE — Progress Notes (Signed)
 Patient Care Team: Creola Corn, MD as PCP - General (Internal Medicine) Lucie Leather, Alvira Philips, MD as Consulting Physician (Allergy and Immunology) Yates Decamp, MD as Consulting Physician (Cardiology) Pyrtle, Carie Caddy, MD as Consulting Physician (Gastroenterology) Lucie Leather, Alvira Philips, MD as Consulting Physician (Allergy and Immunology) Serena Croissant, MD as Consulting Physician (Hematology and Oncology)  DIAGNOSIS:  Encounter Diagnosis  Name Primary?   Thrombocytopenia (HCC) Yes    CHIEF COMPLIANT: Follow-up of thrombocytopenia and neutropenia, complains of profound fatigue  HISTORY OF PRESENT ILLNESS:   History of Present Illness The patient presents with a three-day history of extreme fatigue, describing it as feeling like she is "moving through quicksand." Despite her brain feeling active, her body feels sluggish. She attributes this tiredness to her blood, but is unsure of the exact cause. She reports that her levels have been decent, but her body doesn't feel right.  The patient has a history of liver issues, similar to her late mother, and is interested in exploring a possible genetic link. She is considering participating in [employer], a comprehensive genetic analysis program, to gain a better understanding of her genetic risks.  In addition to her fatigue, the patient has experienced flu-like symptoms every other week in January and February. She also had a severe headache that caused vomiting, leading to an ER visit for suspected meningitis.  The patient's sister has lupus, and she is wondering if she might have a similar autoimmune condition. She has never been diagnosed with an autoimmune disease, but her white blood cell count has dipped recently, which could be a sign of lupus.     ALLERGIES:  is allergic to progesterone and penicillins.  MEDICATIONS:  Current Outpatient Medications  Medication Sig Dispense Refill   albuterol (PROVENTIL) (2.5 MG/3ML) 0.083% nebulizer solution Take  3 mLs (2.5 mg total) by nebulization every 4 (four) hours as needed for wheezing or shortness of breath (coughing fits). 150 mL 1   albuterol (VENTOLIN HFA) 108 (90 Base) MCG/ACT inhaler Inhale 2 puffs into the lungs every 4 (four) hours as needed for wheezing or shortness of breath (coughing fits). 18 g 1   Ascorbic Acid (VITAMIN C) 1000 MG tablet Take 2,000 mg by mouth 2 (two) times daily.     azelastine (ASTELIN) 0.1 % nasal spray Place 1-2 sprays into both nostrils 2 (two) times daily as needed (nasal drainage). Use in each nostril as directed 30 mL 2   Budeson-Glycopyrrol-Formoterol (BREZTRI AEROSPHERE) 160-9-4.8 MCG/ACT AERO Inhale 2 puffs into the lungs in the morning and at bedtime. with spacer and rinse mouth afterwards. 10.7 g 2   CALCIUM PO Take 1 tablet by mouth 2 (two) times daily.     cariprazine (VRAYLAR) 3 MG capsule Take 1 capsule (3 mg total) by mouth daily. 30 capsule 1   carvedilol (COREG) 12.5 MG tablet Take 1 tablet (12.5 mg total) by mouth 2 (two) times daily with a meal. 60 tablet 2   cetirizine (ZYRTEC) 10 MG tablet Take 1 tablet (10 mg total) by mouth 2 (two) times daily. 180 tablet 1   desonide (DESOWEN) 0.05 % ointment Apply 1 Application topically 2 (two) times daily. Apply to the face 180 g 1   diphenhydrAMINE (BENADRYL) 25 MG tablet Take 50 mg by mouth at bedtime as needed for allergies or sleep.     ergocalciferol (VITAMIN D2) 1.25 MG (50000 UT) capsule Take 1 capsule (50,000 Units total) by mouth once a week. (Patient taking differently: Take 50,000 Units by mouth  every Monday.) 12 capsule 1   escitalopram (LEXAPRO) 10 MG tablet Take 1 tablet (10 mg total) by mouth daily. 30 tablet 1   famotidine (PEPCID) 40 MG tablet Take 1 tablet (40 mg total) by mouth 2 (two) times daily. Pt need an office visit for further refills. 180 tablet 1   fluconazole (DIFLUCAN) 150 MG tablet Take 1 tablet (150 mg total) by mouth every three (3) days as needed. (Patient taking differently:  Take 150 mg by mouth every three (3) days as needed (For yeast infection).) 3 tablet 0   gabapentin (NEURONTIN) 400 MG capsule Take 1 capsule (400 mg total) by mouth 3 (three) times daily. 60 capsule 0   lamoTRIgine (LAMICTAL) 25 MG tablet Take 1 tablet (25 mg total) by mouth daily for 14 days, THEN 2 tablets (50 mg total) daily. 60 tablet 1   mometasone (ELOCON) 0.1 % ointment Apply topically 2 (two) times daily. Apply to the body 135 g 1   Na Sulfate-K Sulfate-Mg Sulf 17.5-3.13-1.6 GM/177ML SOLN Take as Directed 354 mL 0   naphazoline-pheniramine (EYE ALLERGY RELIEF) 0.025-0.3 % ophthalmic solution Place 2 drops into both eyes 4 (four) times daily as needed for allergies.     Prenatal Vit-Fe Fumarate-FA (MULTIVITAMIN-PRENATAL) 27-0.8 MG TABS tablet Take 1 tablet by mouth 2 (two) times daily.     promethazine (PHENERGAN) 25 MG tablet Take 1 tablet (25 mg total) by mouth every 6 (six) hours as needed. 120 tablet 1   Respiratory Therapy Supplies (NEBULIZER MASK ADULT) MISC Use as directed 1 each 1   traZODone (DESYREL) 50 MG tablet Take 1 tablet (50 mg total) by mouth at bedtime. 30 tablet 0   ursodiol (ACTIGALL) 500 MG tablet Take 1 tablet (500 mg total) by mouth 3 (three) times daily 90 tablet 11   Vonoprazan Fumarate (VOQUEZNA) 20 MG TABS Take 1 tablet by mouth daily. 30 tablet 1   XIFAXAN 550 MG TABS tablet Take 1 tablet (550 mg total) by mouth 2 (two) times daily. 180 tablet 3   No current facility-administered medications for this visit.   Facility-Administered Medications Ordered in Other Visits  Medication Dose Route Frequency Provider Last Rate Last Admin   0.9 %  sodium chloride infusion   Intravenous Once PRN Pyrtle, Carie Caddy, MD       albuterol (VENTOLIN HFA) 108 (90 Base) MCG/ACT inhaler 2 puff  2 puff Inhalation Once PRN Pyrtle, Carie Caddy, MD       diphenhydrAMINE (BENADRYL) injection 50 mg  50 mg Intravenous Once PRN Pyrtle, Carie Caddy, MD       EPINEPHrine (EPI-PEN) injection 0.3 mg  0.3 mg  Intramuscular Once PRN Pyrtle, Carie Caddy, MD       famotidine (PEPCID) IVPB 20 mg premix  20 mg Intravenous Once PRN Pyrtle, Carie Caddy, MD       iron sucrose (VENOFER) 200 mg in sodium chloride 0.9 % 100 mL IVPB  200 mg Intravenous Once Pyrtle, Carie Caddy, MD       methylPREDNISolone sodium succinate (SOLU-MEDROL) 125 mg/2 mL injection 125 mg  125 mg Intravenous Once PRN Pyrtle, Carie Caddy, MD       promethazine (PHENERGAN) suppository 12.5-25 mg  12.5-25 mg Rectal Q4H PRN Berna Bue, MD        PHYSICAL EXAMINATION: ECOG PERFORMANCE STATUS: 1 - Symptomatic but completely ambulatory  Vitals:   09/09/23 1037  BP: (!) 121/49  Pulse: 90  Resp: 18  Temp: 98.8 F (37.1 C)  SpO2: 100%   Filed Weights   09/09/23 1037  Weight: 191 lb 6.4 oz (86.8 kg)      LABORATORY DATA:  I have reviewed the data as listed    Latest Ref Rng & Units 08/10/2023   12:40 PM 05/08/2023    3:29 PM 02/24/2023    6:35 PM  CMP  Glucose 70 - 99 mg/dL 604  83  540   BUN 6 - 20 mg/dL 11  12  10    Creatinine 0.44 - 1.00 mg/dL 9.81  1.91  4.78   Sodium 135 - 145 mmol/L 140  143  143   Potassium 3.5 - 5.1 mmol/L 5.0  4.4  3.7   Chloride 98 - 111 mmol/L 109  112  104   CO2 22 - 32 mmol/L 24  26  21    Calcium 8.9 - 10.3 mg/dL 8.9  8.4  9.5   Total Protein 6.5 - 8.1 g/dL 6.7  5.7  6.5   Total Bilirubin 0.0 - 1.2 mg/dL 0.8  0.3  0.8   Alkaline Phos 38 - 126 U/L 62  66  47   AST 15 - 41 U/L 18  11  16    ALT 0 - 44 U/L 15  8  14      Lab Results  Component Value Date   WBC 2.7 (L) 09/09/2023   HGB 13.3 09/09/2023   HCT 41.9 09/09/2023   MCV 94.4 09/09/2023   PLT 76 (L) 09/09/2023   NEUTROABS 1.4 (L) 09/09/2023    ASSESSMENT & PLAN:  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 Hepatitis A showed seropositivity Severe fatigue: We will check for lupus. Neutropenia: Could be autoimmune in etiology.  Awaiting B12 levels.  Treatment plan: No role of IV iron at this time. Prior gastric bypass surgery   Assessment & Plan Fatigue Significant fatigue with suspected underlying immunological condition, possibly lupus. Low white blood cell count suggests autoimmune process. - Request blood work for lupus and autoimmune antibodies. - Monitor and replace B12 if low.  Thrombocytopenia Platelet count at 76, consistent with previous levels, no immediate concern.  Chronic Anemia Hemoglobin, iron levels, and reticulocyte count normal. Chronic anemia well-managed.   Genetic Risk Assessment Interested in genetic links to mother's liver condition. Recommended [facility] for comprehensive genetic analysis. - Recommend contacting [facility] for genetic analysis.      No orders of the defined types were placed in this encounter.  The patient has a good understanding of the overall plan. she agrees with it. she will call with any problems that may develop before the next visit here. Total time spent: 30 mins including face to face time and time spent for planning, charting and co-ordination of care   Tamsen Meek, MD 09/09/23

## 2023-09-09 NOTE — Telephone Encounter (Signed)
 Message acknowledged and reviewed.

## 2023-09-09 NOTE — Progress Notes (Signed)
 Provider was contacted by [facility] regarding patient's medication refill request. Patient's next appointment with this provider is scheduled for 09/15/2023. Provider bridge her medications until next appointment.

## 2023-09-10 LAB — ANTI-DNA ANTIBODY, DOUBLE-STRANDED: ds DNA Ab: 1 [IU]/mL (ref 0–9)

## 2023-09-10 LAB — ANTI-SMITH ANTIBODY: ENA SM Ab Ser-aCnc: <0.2 AI (ref 0.0–0.9)

## 2023-09-11 LAB — ANTINUCLEAR ANTIBODIES, IFA: ANA Ab, IFA: NEGATIVE

## 2023-09-15 ENCOUNTER — Other Ambulatory Visit: Payer: Self-pay

## 2023-09-15 ENCOUNTER — Other Ambulatory Visit (HOSPITAL_BASED_OUTPATIENT_CLINIC_OR_DEPARTMENT_OTHER): Payer: Self-pay

## 2023-09-15 ENCOUNTER — Telehealth (HOSPITAL_COMMUNITY): Admitting: Physician Assistant

## 2023-09-15 ENCOUNTER — Encounter (HOSPITAL_COMMUNITY): Payer: Self-pay | Admitting: Physician Assistant

## 2023-09-15 ENCOUNTER — Encounter: Payer: Self-pay | Admitting: Hematology and Oncology

## 2023-09-15 DIAGNOSIS — F411 Generalized anxiety disorder: Secondary | ICD-10-CM

## 2023-09-15 DIAGNOSIS — F4310 Post-traumatic stress disorder, unspecified: Secondary | ICD-10-CM | POA: Diagnosis not present

## 2023-09-15 DIAGNOSIS — F3181 Bipolar II disorder: Secondary | ICD-10-CM

## 2023-09-15 DIAGNOSIS — F99 Mental disorder, not otherwise specified: Secondary | ICD-10-CM

## 2023-09-15 DIAGNOSIS — F5105 Insomnia due to other mental disorder: Secondary | ICD-10-CM | POA: Diagnosis not present

## 2023-09-15 MED ORDER — TRAZODONE HCL 50 MG PO TABS
50.0000 mg | ORAL_TABLET | Freq: Every day | ORAL | 1 refills | Status: DC
  Filled 2023-09-15 – 2023-09-21 (×2): qty 30, 30d supply, fill #0
  Filled 2023-10-07 – 2023-10-15 (×2): qty 30, 30d supply, fill #1

## 2023-09-15 MED ORDER — LAMOTRIGINE 25 MG PO TABS
50.0000 mg | ORAL_TABLET | Freq: Every day | ORAL | 1 refills | Status: DC
  Filled 2023-09-15 – 2023-09-21 (×2): qty 60, 30d supply, fill #0
  Filled 2023-10-07 – 2023-10-15 (×2): qty 60, 30d supply, fill #1

## 2023-09-15 MED ORDER — GABAPENTIN 400 MG PO CAPS
400.0000 mg | ORAL_CAPSULE | Freq: Three times a day (TID) | ORAL | 1 refills | Status: DC
  Filled 2023-09-15 – 2023-09-26 (×5): qty 90, 30d supply, fill #0
  Filled 2023-10-07 – 2023-10-24 (×4): qty 90, 30d supply, fill #1
  Filled ????-??-??: fill #1

## 2023-09-15 MED ORDER — ESCITALOPRAM OXALATE 10 MG PO TABS
10.0000 mg | ORAL_TABLET | Freq: Every day | ORAL | 1 refills | Status: DC
  Filled 2023-09-15 – 2023-09-21 (×2): qty 30, 30d supply, fill #0
  Filled 2023-10-07 – 2023-10-15 (×2): qty 30, 30d supply, fill #1

## 2023-09-15 MED ORDER — CARIPRAZINE HCL 3 MG PO CAPS
3.0000 mg | ORAL_CAPSULE | Freq: Every day | ORAL | 1 refills | Status: DC
  Filled 2023-09-15 – 2023-09-21 (×2): qty 30, 30d supply, fill #0
  Filled 2023-10-07 – 2023-10-15 (×2): qty 30, 30d supply, fill #1

## 2023-09-15 NOTE — Progress Notes (Unsigned)
 BH MD/PA/NP OP Progress Note  Virtual Visit via Video Note  I connected with Linda Becker on 09/15/23 at  4:30 PM EDT by a video enabled telemedicine application and verified that I am speaking with the correct person using two identifiers.  Location: Patient: Home Provider: Clinic   I discussed the limitations of evaluation and management by telemedicine and the availability of in person appointments. The patient expressed understanding and agreed to proceed.  Follow Up Instructions:  I discussed the assessment and treatment plan with the patient. The patient was provided an opportunity to ask questions and all were answered. The patient agreed with the plan and demonstrated an understanding of the instructions.   The patient was advised to call back or seek an in-person evaluation if the symptoms worsen or if the condition fails to improve as anticipated.  I provided 16 minutes of non-face-to-face time during this encounter.  Meta Hatchet, PA    09/15/2023 10:33 PM KALICIA DUFRESNE  MRN:  [id number]  Chief Complaint:  Chief Complaint  Patient presents with  . Follow-up  . Medication Refill   HPI:   Linda Becker is a 33 year old female with a past psychiatric history significant for bipolar 2 disorder and generalized anxiety disorder who presents to [facility] via virtual video visit for follow-up and medication management.  Patient is currently being managed on the following psychiatric medications:  Escitalopram 10 mg daily Cariprazine (Vraylar) 3 mg daily Gabapentin 400 mg 3 times daily Lamotrigine 50 mg daily  Patient presents to the encounter stating that she is happier and at peace.  Despite feeling happier, patient states that she also feels more exhausted and often feels like she is walking around in a daze.  Despite her exhaustion, patient reports that she feels more productive and has been doing more chores  done regularly.  Patient states that she has been taking her medications regularly and feels more stable overall.  Patient endorses very minimal depression and states that her anxiety levels have decreased dramatically.  She rates her anxiety at 4 out of 10 and states that she is in control.  Patient denies any new stressors at this time.  Patient  Patient is alert and oriented x 4, calm, cooperative, and fully engaged in conversation during the encounter. ***  Visit Diagnosis:    ICD-10-CM   1. PTSD (post-traumatic stress disorder)  F43.10 lamoTRIgine (LAMICTAL) 25 MG tablet    2. Bipolar 2 disorder (HCC)  F31.81 cariprazine (VRAYLAR) 3 MG capsule    gabapentin (NEURONTIN) 400 MG capsule    escitalopram (LEXAPRO) 10 MG tablet    3. Generalized anxiety disorder  F41.1 gabapentin (NEURONTIN) 400 MG capsule    escitalopram (LEXAPRO) 10 MG tablet    4. Insomnia due to other mental disorder  F51.05 traZODone (DESYREL) 50 MG tablet   F99        Past Psychiatric History:  Patient endorses a past psychiatric history significant for bipolar 2 disorder, OCD, anxiety, and PTSD   Patient denies a past history of hospitalization due to mental health.  She does report that she was recently voluntarily admitted overnight at [facility] in the past (02/24/2023)   Patient denies a past history of suicide attempts   Patient denies a past history of homicide attempts  Past Medical History:  Past Medical History:  Diagnosis Date  . Allergic rhinitis    remote hx of allergy shots  . Asthma  since childhood  on controller meds extrinsic dr Sharyn Lull  . Bipolar II disorder (HCC)   . Cirrhosis (HCC)   . Erosive esophagitis   . Esophageal varices (HCC)   . Family history of adverse reaction to anesthesia    mom and sister difficult to awaken per patient   . Gallbladder sludge   . Gastropathy   . GERD (gastroesophageal reflux disease)    on nexium  for  long term sx since childhood  . H/O  miscarriage, not currently pregnant    [redacted] weeks  march 2016  . Hepatic steatosis   . Hiatal hernia   . Hypothyroidism   . Migraine   . Murmur    pt reports MVP  . Portal hypertension (HCC)   . Sessile colonic polyp   . Splenomegaly   . Steatohepatitis   . Syncope    under eval ? cause  . Tachycardia    episodes with near syncope eval dr Nadara Eaton  on no meds dced LABA  . Thrombocytopenia (HCC)   . Upper GI bleed     Past Surgical History:  Procedure Laterality Date  . BIOPSY  09/03/2018   Procedure: BIOPSY;  Surgeon: Beverley Fiedler, MD;  Location: [facility];  Service: Gastroenterology;;  . BIOPSY  09/26/2020   Procedure: BIOPSY;  Surgeon: Beverley Fiedler, MD;  Location: [facility];  Service: Gastroenterology;;  . BIOPSY  05/12/2022   Procedure: BIOPSY;  Surgeon: Beverley Fiedler, MD;  Location: [facility];  Service: Gastroenterology;;  . broken right femur  2008   rod placed  . COLONOSCOPY    . COLONOSCOPY WITH ESOPHAGOGASTRODUODENOSCOPY (EGD)  03/24/2023   Erick Blinks at [facility]  . COLONOSCOPY WITH PROPOFOL N/A 09/03/2018   Procedure: COLONOSCOPY WITH PROPOFOL;  Surgeon: Beverley Fiedler, MD;  Location: [facility];  Service: Gastroenterology;  Laterality: N/A;  . DILATION AND CURETTAGE OF UTERUS    . ESOPHAGOGASTRODUODENOSCOPY (EGD) WITH PROPOFOL N/A 09/03/2018   Procedure: ESOPHAGOGASTRODUODENOSCOPY (EGD) WITH PROPOFOL;  Surgeon: Beverley Fiedler, MD;  Location: [facility];  Service: Gastroenterology;  Laterality: N/A;  . ESOPHAGOGASTRODUODENOSCOPY (EGD) WITH PROPOFOL N/A 01/27/2020   Procedure: ESOPHAGOGASTRODUODENOSCOPY (EGD) WITH PROPOFOL;  Surgeon: Meridee Score Netty Starring., MD;  Location: [facility] ENDOSCOPY;  Service: Gastroenterology;  Laterality: N/A;  . ESOPHAGOGASTRODUODENOSCOPY (EGD) WITH PROPOFOL N/A 03/12/2020   Procedure: ESOPHAGOGASTRODUODENOSCOPY (EGD) WITH PROPOFOL;  Surgeon: Beverley Fiedler, MD;  Location: [facility];  Service: Gastroenterology;  Laterality: N/A;  .  ESOPHAGOGASTRODUODENOSCOPY (EGD) WITH PROPOFOL N/A 09/26/2020   Procedure: ESOPHAGOGASTRODUODENOSCOPY (EGD) WITH PROPOFOL;  Surgeon: Beverley Fiedler, MD;  Location: [facility];  Service: Gastroenterology;  Laterality: N/A;  . ESOPHAGOGASTRODUODENOSCOPY (EGD) WITH PROPOFOL N/A 04/29/2021   Procedure: ESOPHAGOGASTRODUODENOSCOPY (EGD) WITH PROPOFOL;  Surgeon: Beverley Fiedler, MD;  Location: [facility];  Service: Gastroenterology;  Laterality: N/A;  . ESOPHAGOGASTRODUODENOSCOPY (EGD) WITH PROPOFOL N/A 05/12/2022   Procedure: ESOPHAGOGASTRODUODENOSCOPY (EGD) WITH PROPOFOL;  Surgeon: Beverley Fiedler, MD;  Location: [facility];  Service: Gastroenterology;  Laterality: N/A;  . GASTRIC VARICES BANDING  01/27/2020   Procedure: GASTRIC VARICES BANDING;  Surgeon: Meridee Score Netty Starring., MD;  Location: [facility] ENDOSCOPY;  Service: Gastroenterology;;  . HOT HEMOSTASIS N/A 04/29/2021   Procedure: HOT HEMOSTASIS (ARGON PLASMA COAGULATION/BICAP);  Surgeon: Beverley Fiedler, MD;  Location: [facility];  Service: Gastroenterology;  Laterality: N/A;  . IR TRANSCATHETER BX  01/19/2019  . IR US GUIDE VASC ACCESS RIGHT  01/19/2019  . IR VENOGRAM HEPATIC W HEMODYNAMIC EVALUATION  01/19/2019  .  LAPAROSCOPIC GASTRIC SLEEVE RESECTION N/A 06/28/2019   Procedure: LAPAROSCOPIC GASTRIC SLEEVE RESECTION, Upper Endo, ERAS Pathway;  Surgeon: Berna Bue, MD;  Location: [facility]S;  Service: General;  Laterality: N/A;  . OB ultrasound N/A 12/01/2017   see report  . POLYPECTOMY  09/03/2018   Procedure: POLYPECTOMY;  Surgeon: Beverley Fiedler, MD;  Location: [facility];  Service: Gastroenterology;;  . Marcellina Millin  . UPPER GASTROINTESTINAL ENDOSCOPY    . WISDOM TOOTH EXTRACTION      Family Psychiatric History:  Father - depression Sister - bipolar disorder, depression Nephew - Autistic with severe ADHD   Family history of suicide attempt: Patient denies Family history of homicide attempt: Patient denies Family history  of substance abuse: Patient denies  Family History:  Family History  Problem Relation Age of Onset  . Liver disease Mother 12       advanced non-alcoholic cirrhosis  . Arthritis Mother   . Hyperlipidemia Mother   . Heart disease Mother   . Hypertension Mother   . Diabetes Mother   . Kidney disease Mother   . Bleeding Disorder Mother   . Colon polyps Mother   . Asthma Father   . Arthritis Father   . Hyperlipidemia Father   . Heart disease Father   . Hypertension Father   . Diabetes Father   . Colon polyps Father   . Breast cancer Maternal Aunt   . Breast cancer Paternal Aunt   . Esophageal cancer Paternal Aunt   . Arthritis Maternal Grandmother   . Breast cancer Maternal Grandmother   . Thyroid disease Maternal Grandmother   . Diabetes Maternal Grandfather   . Pancreatic cancer Maternal Grandfather   . Breast cancer Paternal Grandmother   . Diabetes Paternal Grandmother   . Colon cancer Paternal Grandfather   . Diabetes Paternal Grandfather   . Liver cancer Paternal Grandfather   . Stomach cancer Paternal Grandfather     Social History:  Social History   Socioeconomic History  . Marital status: Married    Spouse name: Not on file  . Number of children: 1  . Years of education: Not on file  . Highest education level: Not on file  Occupational History  . Not on file  Tobacco Use  . Smoking status: Never  . Smokeless tobacco: Never  Vaping Use  . Vaping status: Never Used  Substance and Sexual Activity  . Alcohol use: No    Alcohol/week: 0.0 standard drinks of alcohol  . Drug use: No  . Sexual activity: Yes    Birth control/protection: None  Other Topics Concern  . 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 Difficulty of Paying Living Expenses: Very hard  Food Insecurity: Food Insecurity Present (02/24/2023)   Hunger Vital Sign   . Worried About Programme researcher, broadcasting/film/video in the Last Year: Often true   . Ran Out of Food in the Last Year: Often true  Transportation Needs: No Transportation Needs (02/24/2023)   PRAPARE - Transportation   .  Lack of Transportation (Medical): No   . Lack of Transportation (Non-Medical): No  Physical Activity: Inactive (05/26/2023)   Exercise Vital Sign   . Days of Exercise per Week: 0 days   . Minutes of Exercise per Session: 0 min  Stress: Stress Concern Present (05/26/2023)   [employer] of Occupational Health - Occupational Stress Questionnaire   . Feeling of Stress : Very much  Social Connections: Socially Isolated (05/26/2023)   Social Connection and Isolation Panel [NHANES]   . Frequency of Communication with Friends and Family: More than three times a week   . Frequency of Social Gatherings with Friends and Family: Twice a week   . Attends Religious Services: Never   . Active Member of Clubs or Organizations: No   . Attends Banker Meetings: Never   . Marital Status: Separated    Allergies:  Allergies  Allergen Reactions  . Progesterone Rash and Other (See Comments)    Was in a form of birth control  . Penicillins Rash and Other (See Comments)    Did it involve swelling of the face/tongue/throat, SOB, or low BP? No Did it involve sudden or severe rash/hives, skin peeling, or any reaction on the inside of your mouth or nose? Yes Did you need to seek medical attention at a hospital or doctor's office? No When did it last happen?      9 + months If all above answers are "NO", may proceed with cephalosporin use.      Metabolic Disorder Labs: Lab Results  Component Value Date   HGBA1C 4.4 (L) 02/24/2023   MPG 80 02/24/2023   No  results found for: "PROLACTIN" Lab Results  Component Value Date   CHOL 221 (H) 02/24/2023   TRIG 103 02/24/2023   HDL 49 02/24/2023   CHOLHDL 4.5 02/24/2023   VLDL 21 02/24/2023   LDLCALC 151 (H) 02/24/2023   LDLCALC 122 (H) 03/11/2019   Lab Results  Component Value Date   TSH 0.809 02/24/2023   TSH 3.05 08/27/2022    Therapeutic Level Labs: No results found for: "LITHIUM" No results found for: "VALPROATE" No results found for: "CBMZ"  Current Medications: Current Outpatient Medications  Medication Sig Dispense Refill  . albuterol (PROVENTIL) (2.5 MG/3ML) 0.083% nebulizer solution Take 3 mLs (2.5 mg total) by nebulization every 4 (four) hours as needed for wheezing or shortness of breath (coughing fits). 150 mL 1  . albuterol (VENTOLIN HFA) 108 (90 Base) MCG/ACT inhaler Inhale 2 puffs into the lungs every 4 (four) hours as needed for wheezing or shortness of breath (coughing fits). 18 g 1  . Ascorbic Acid (VITAMIN C) 1000 MG tablet Take 2,000 mg by mouth 2 (two) times daily.    Marland Kitchen azelastine (ASTELIN) 0.1 % nasal spray Place 1-2 sprays into both nostrils 2 (two) times daily as needed (nasal drainage). Use in each nostril as directed 30 mL 2  . budeson-glycopyrrolate-formoterol (BREZTRI AEROSPHERE) 160-9-4.8 MCG/ACT AERO Inhale 2 puffs into the lungs in the morning and at bedtime. with spacer and rinse mouth afterwards. 10.7 g 2  . CALCIUM PO Take 1 tablet by mouth 2 (two) times daily.    . cariprazine (VRAYLAR) 3 MG capsule Take 1 capsule (3 mg total) by mouth daily. 30 capsule 1  . carvedilol (COREG) 12.5 MG tablet Take 1 tablet (12.5 mg total) by mouth 2 (two) times daily with a meal. 60 tablet 2  . cetirizine (ZYRTEC) 10 MG tablet Take 1 tablet (10 mg total)  by mouth 2 (two) times daily. 180 tablet 1  . desonide (DESOWEN) 0.05 % ointment Apply 1 Application topically 2 (two) times daily. Apply to the face 180 g 1  . diphenhydrAMINE (BENADRYL) 25 MG tablet Take 50 mg by mouth at  bedtime as needed for allergies or sleep.    . ergocalciferol (VITAMIN D2) 1.25 MG (50000 UT) capsule Take 1 capsule (50,000 Units total) by mouth once a week. (Patient taking differently: Take 50,000 Units by mouth every Monday.) 12 capsule 1  . escitalopram (LEXAPRO) 10 MG tablet Take 1 tablet (10 mg total) by mouth daily. 30 tablet 1  . famotidine (PEPCID) 40 MG tablet Take 1 tablet (40 mg total) by mouth 2 (two) times daily. Pt need an office visit for further refills. 180 tablet 1  . fluconazole (DIFLUCAN) 150 MG tablet Take 1 tablet (150 mg total) by mouth every three (3) days as needed. (Patient taking differently: Take 150 mg by mouth every three (3) days as needed (For yeast infection).) 3 tablet 0  . gabapentin (NEURONTIN) 400 MG capsule Take 1 capsule (400 mg total) by mouth 3 (three) times daily. 90 capsule 1  . lamoTRIgine (LAMICTAL) 25 MG tablet Take 2 tablets (50 mg total) by mouth daily. 60 tablet 1  . mometasone (ELOCON) 0.1 % ointment Apply topically 2 (two) times daily. Apply to the body 135 g 1  . Na Sulfate-K Sulfate-Mg Sulf 17.5-3.13-1.6 GM/177ML SOLN Take as Directed 354 mL 0  . naphazoline-pheniramine (EYE ALLERGY RELIEF) 0.025-0.3 % ophthalmic solution Place 2 drops into both eyes 4 (four) times daily as needed for allergies.    . Prenatal Vit-Fe Fumarate-FA (MULTIVITAMIN-PRENATAL) 27-0.8 MG TABS tablet Take 1 tablet by mouth 2 (two) times daily.    . promethazine (PHENERGAN) 25 MG tablet Take 1 tablet (25 mg total) by mouth every 6 (six) hours as needed. 120 tablet 1  . Respiratory Therapy Supplies (NEBULIZER MASK ADULT) MISC Use as directed 1 each 1  . traZODone (DESYREL) 50 MG tablet Take 1 tablet (50 mg total) by mouth at bedtime. 30 tablet 1  . ursodiol (ACTIGALL) 500 MG tablet Take 1 tablet (500 mg total) by mouth 3 (three) times daily 90 tablet 11  . Vonoprazan Fumarate (VOQUEZNA) 20 MG TABS Take 1 tablet by mouth daily. 30 tablet 1  . XIFAXAN 550 MG TABS tablet Take 1  tablet (550 mg total) by mouth 2 (two) times daily. 180 tablet 3   No current facility-administered medications for this visit.   Facility-Administered Medications Ordered in Other Visits  Medication Dose Route Frequency Provider Last Rate Last Admin  . 0.9 %  sodium chloride infusion   Intravenous Once PRN Pyrtle, Carie Caddy, MD      . albuterol (VENTOLIN HFA) 108 (90 Base) MCG/ACT inhaler 2 puff  2 puff Inhalation Once PRN Pyrtle, Carie Caddy, MD      . diphenhydrAMINE (BENADRYL) injection 50 mg  50 mg Intravenous Once PRN Pyrtle, Carie Caddy, MD      . EPINEPHrine (EPI-PEN) injection 0.3 mg  0.3 mg Intramuscular Once PRN Pyrtle, Carie Caddy, MD      . famotidine (PEPCID) IVPB 20 mg premix  20 mg Intravenous Once PRN Pyrtle, Carie Caddy, MD      . iron sucrose (VENOFER) 200 mg in sodium chloride 0.9 % 100 mL IVPB  200 mg Intravenous Once Pyrtle, Carie Caddy, MD      . methylPREDNISolone sodium succinate (SOLU-MEDROL) 125 mg/2 mL injection 125 mg  125 mg Intravenous Once PRN Pyrtle, Carie Caddy, MD      . promethazine (PHENERGAN) suppository 12.5-25 mg  12.5-25 mg Rectal Q4H PRN Berna Bue, MD         Musculoskeletal: Strength & Muscle Tone: within normal limits Gait & Station: normal Patient leans: N/A  Psychiatric Specialty Exam: Review of Systems  Psychiatric/Behavioral:  Negative for decreased concentration, dysphoric mood, hallucinations, self-injury, sleep disturbance and suicidal ideas. The patient is nervous/anxious. The patient is not hyperactive.     There were no vitals taken for this visit.There is no height or weight on file to calculate BMI.  General Appearance: Casual  Eye Contact:  Good  Speech:  Clear and Coherent and Normal Rate  Volume:  Normal  Mood:  Euthymic  Affect:  Appropriate  Thought Process:  Coherent, Goal Directed, and Descriptions of Associations: Intact  Orientation:  Full (Time, Place, and Person)  Thought Content: WDL   Suicidal Thoughts:  No  Homicidal Thoughts:  No  Memory:   Immediate;   Good Recent;   Good Remote;   Good  Judgement:  Good  Insight:  Good  Psychomotor Activity:  Normal  Concentration:  Concentration: Good and Attention Span: Good  Recall:  Good  Fund of Knowledge: Good  Language: Good  Akathisia:  No  Handed:  Right  AIMS (if indicated): not done  Assets:  Communication Skills Desire for Improvement Housing Social Support  ADL's:  Intact  Cognition: WNL  Sleep:  Good   Screenings: GAD-7    Flowsheet Row [facility]15/20[facility] Video Visit from 07/01/2023 in [facility] Video Visit from 05/20/2023 in [facility] Video Visit from 04/14/2023 in [facility] Office Visit from 02/26/2023 in [facility]  Total GAD-7 Score 6 18 12 6 19       PHQ2-9    Flowsheet Row Video Visit from 09/15/2023 in [facility] Video Visit from 07/01/2023 in [facility] Video Visit from 05/20/2023 in [facility] Video Visit from 04/14/2023 in [facility] Counselor from 03/04/2023 in [facility]  PHQ-2 Total Score 2 6 4 2 6   PHQ-9 Total Score 11 16 10 7 25       Flowsheet Row Video Visit from 09/15/2023 in [facility] ED from 08/10/2023 in [facility] Emergency Department [facility] Video Visit from 07/01/2023 in [facility]  C-SSRS RISK CATEGORY No Risk No Risk No Risk        Assessment and Plan:   Linda N. Reppucci is a 33 year old female with a past psychiatric history significant for bipolar 2 disorder and generalized anxiety disorder who presents to [facility] via virtual video visit for follow-up and medication management. ***  Collaboration of  Care: Collaboration of Care: Medication Management AEB Provider managing patient's psychiatric medications, Primary Care Provider AEB patient being followed by a primary care provider, Psychiatrist AEB patient being followed by mental health provider at this facility, Other provider involved in patient's care AEB patient being followed by gastroenterology, and Referral or follow-up with counselor/therapist AEB patient being seen by licensed clinical social worker at this facility  Patient/Guardian was advised Release of Information must be obtained prior to any record release in order to collaborate their care with an outside provider.  Patient/Guardian was advised if they have not already done so to contact the registration department to sign all necessary forms in order for [place] to release information regarding their care.   Consent: Patient/Guardian gives verbal consent for treatment and assignment of benefits for services provided during this visit. Patient/Guardian expressed understanding and agreed to proceed.   1. PTSD (post-traumatic stress disorder)  - lamoTRIgine (LAMICTAL) 25 MG tablet; Take 2 tablets (50 mg total) by mouth daily.  Dispense: 60 tablet; Refill: 1  2. Bipolar 2 disorder (HCC)  - cariprazine (VRAYLAR) 3 MG capsule; Take 1 capsule (3 mg total) by mouth daily.  Dispense: 30 capsule; Refill: 1 - gabapentin (NEURONTIN) 400 MG capsule; Take 1 capsule (400 mg total) by mouth 3 (three) times daily.  Dispense: 90 capsule; Refill: 1 - escitalopram (LEXAPRO) 10 MG tablet; Take 1 tablet (10 mg total) by mouth daily.  Dispense: 30 tablet; Refill: 1  3. Generalized anxiety disorder  - gabapentin (NEURONTIN) 400 MG capsule; Take 1 capsule (400 mg total) by mouth 3 (three) times daily.  Dispense: 90 capsule; Refill: 1 - escitalopram (LEXAPRO) 10 MG tablet; Take 1 tablet (10 mg total) by mouth daily.  Dispense: 30 tablet; Refill: 1  4. Insomnia due to other mental disorder  - traZODone  (DESYREL) 50 MG tablet; Take 1 tablet (50 mg total) by mouth at bedtime.  Dispense: 30 tablet; Refill: 1  Patient to follow up in 6 weeks Provider spent a total of 16 minutes with the patient/reviewing patient's chart  Meta Hatchet, PA 09/15/2023, 10:33 PM

## 2023-09-16 ENCOUNTER — Other Ambulatory Visit (HOSPITAL_COMMUNITY): Payer: Self-pay

## 2023-09-16 ENCOUNTER — Other Ambulatory Visit (HOSPITAL_BASED_OUTPATIENT_CLINIC_OR_DEPARTMENT_OTHER): Payer: Self-pay

## 2023-09-16 NOTE — Assessment & Plan Note (Signed)
 Due to cirrhosis of the liver and splenomegaly   Lab review: 02/04/2018: Platelets 135 07/27/2018: Platelet count 102 01/28/2020: Platelet count 53 02/07/2020: Platelet count 63 05/13/2022: Platelets 87 01/06/2023: Hemoglobin 7, MCV 77.4, platelets 71, WBC 2.6, iron saturation 2.8%, TIBC 459, ferritin 2.9 01/19/2023: Hemoglobin 7.4, MCV 86, WBC 2.1, platelets 71 (patient received 4 doses of IV iron so far) 03/02/2023: Hemoglobin 10.4, platelets 68, WBC 2.7, reticulocytes 1.2%, iron saturation 7%, B12 432, ferritin 8 08/10/23: Hb 13.4, Platelet 70   Previous work-up was negative hepatitis B and C. Hepatitis A showed seropositivity Severe fatigue: We will check for lupus. Neutropenia: Could be autoimmune in etiology.  Awaiting B12 levels.   Treatment plan: No role of IV iron at this time. Prior gastric bypass surgery

## 2023-09-17 ENCOUNTER — Inpatient Hospital Stay: Attending: Hematology and Oncology | Admitting: Hematology and Oncology

## 2023-09-17 DIAGNOSIS — D696 Thrombocytopenia, unspecified: Secondary | ICD-10-CM

## 2023-09-17 NOTE — Progress Notes (Signed)
 HEMATOLOGY-ONCOLOGY TELEPHONE VISIT PROGRESS NOTE  I connected with our patient on 09/17/23 at  8:30 AM EDT by telephone and verified that I am speaking with the correct person using two identifiers.  I discussed the limitations, risks, security and privacy concerns of performing an evaluation and management service by telephone and the availability of in person appointments.  I also discussed with the patient that there may be a patient responsible charge related to this service. The patient expressed understanding and agreed to proceed.   History of Present Illness: Telephone follow-up to discuss results of blood work done on 09/09/2023  History of Present Illness Patient had blood work for fatigue and neutropenia to evaluate for lupus.  ANA and anti-smooth muscle antibody negative,The patient presents with ongoing fatigue and lethargy, with no change in her condition. She reports feeling 'real tired, real lethargic, kind of just dragging around.' Despite extensive testing, including for lupus due to a family history, no clear cause for her symptoms has been identified. The lupus testing came back negative, and while her ferritin levels could be improved, they are not low enough to necessitate an iron infusion. The patient's hemoglobin levels are satisfactory, and she expresses surprise and satisfaction at her body's resilience. She recalls receiving a monoferric iron infusion in September, which she believes is still working effectively.    REVIEW OF SYSTEMS:   Constitutional: Generalized fatigue All other systems were reviewed with the patient and are negative. 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 Hepatitis A showed seropositivity Severe fatigue: We will check for lupus. Neutropenia: 09/09/2023: ANA negative anti-smooth muscle antibody negative, anti-double-stranded DNA antibody negative: Reticulocyte count: 1.4%, ferritin 12, iron saturation 13%   Treatment plan: No role of IV iron at this time. No evidence of lupus to explain the cause of the neutropenia. Prior gastric bypass surgery Recheck labs in 3 months and follow-up after that with a telephone visit --------------------------------- Assessment and Plan Assessment & Plan Severe Fatigue Persistent severe fatigue with no clear etiology. Recent lupus testing negative. B12, iron, and ferritin levels not significantly low. Hemoglobin levels not indicative of anemia. Previous Monoferric infusion effective and well-tolerated. Insurance coverage confirmed. - Recheck labs in three months to monitor iron levels. - Schedule follow-up appointment post-lab results.  Chronic Anemia Chronic anemia managed with Monoferric infusion. Current hemoglobin levels not indicative of active anemia. Monoferric chosen for single-dose efficacy and insurance coverage. - Monitor hemoglobin levels during next lab recheck in three months.  Genetic Risk Assessment Family history of lupus, recent testing negative. Discussed lupus is generally not genetic but can run in families.      I discussed the assessment and treatment plan with the patient. The patient was provided an opportunity to ask questions and all were answered. The patient agreed with the plan and demonstrated an understanding of the instructions. The patient was  advised to call back or seek an in-person evaluation if the  symptoms worsen or if the condition fails to improve as anticipated.   I provided 20 minutes of non-face-to-face time during this encounter.  This includes time for charting and coordination of care   Tamsen Meek, MD

## 2023-09-18 ENCOUNTER — Other Ambulatory Visit: Payer: Self-pay | Admitting: Medical Genetics

## 2023-09-18 ENCOUNTER — Telehealth: Payer: Self-pay | Admitting: Hematology and Oncology

## 2023-09-18 ENCOUNTER — Other Ambulatory Visit: Payer: Self-pay

## 2023-09-18 ENCOUNTER — Other Ambulatory Visit (HOSPITAL_BASED_OUTPATIENT_CLINIC_OR_DEPARTMENT_OTHER): Payer: Self-pay

## 2023-09-18 NOTE — Telephone Encounter (Signed)
 Left vm for pt about scheduled appt time and date.

## 2023-09-21 ENCOUNTER — Other Ambulatory Visit: Payer: Self-pay

## 2023-09-21 ENCOUNTER — Other Ambulatory Visit

## 2023-09-21 ENCOUNTER — Other Ambulatory Visit (HOSPITAL_BASED_OUTPATIENT_CLINIC_OR_DEPARTMENT_OTHER): Payer: Self-pay

## 2023-09-23 ENCOUNTER — Inpatient Hospital Stay: Payer: Medicaid Other

## 2023-09-24 ENCOUNTER — Other Ambulatory Visit (HOSPITAL_BASED_OUTPATIENT_CLINIC_OR_DEPARTMENT_OTHER): Payer: Self-pay

## 2023-09-25 ENCOUNTER — Other Ambulatory Visit (HOSPITAL_BASED_OUTPATIENT_CLINIC_OR_DEPARTMENT_OTHER): Payer: Self-pay

## 2023-09-26 ENCOUNTER — Other Ambulatory Visit (HOSPITAL_BASED_OUTPATIENT_CLINIC_OR_DEPARTMENT_OTHER): Payer: Self-pay

## 2023-09-28 ENCOUNTER — Telehealth: Payer: Self-pay

## 2023-09-28 NOTE — Telephone Encounter (Signed)
 Called and left a message a message asking her to call the office back. Appt on 4/16 need to be moved to 11 am or 1120 with L. Debbie Fails, NP.

## 2023-09-28 NOTE — Telephone Encounter (Signed)
 Called to move appt with Stefani Edin, NP. She canceled appt due to seeing Dr. Lee Public on 4/3. Sent L. Debbie Fails, NP a message.

## 2023-09-30 ENCOUNTER — Telehealth: Payer: Medicaid Other | Admitting: Adult Health

## 2023-10-01 ENCOUNTER — Encounter: Payer: Self-pay | Admitting: Hematology and Oncology

## 2023-10-05 ENCOUNTER — Inpatient Hospital Stay

## 2023-10-05 ENCOUNTER — Inpatient Hospital Stay: Admitting: Hematology and Oncology

## 2023-10-05 ENCOUNTER — Ambulatory Visit: Admitting: Hematology and Oncology

## 2023-10-05 NOTE — Assessment & Plan Note (Deleted)
 Due to cirrhosis of the liver and splenomegaly   Lab review: 02/04/2018: Platelets 135 07/27/2018: Platelet count 102 01/28/2020: Platelet count 53 02/07/2020: Platelet count 63 05/13/2022: Platelets 87 01/06/2023: Hemoglobin 7, MCV 77.4, platelets 71, WBC 2.6, iron  saturation 2.8%, TIBC 459, ferritin 2.9 01/19/2023: Hemoglobin 7.4, MCV 86, WBC 2.1, platelets 71 (patient received 4 doses of IV iron  so far) 03/02/2023: Hemoglobin 10.4, platelets 68, WBC 2.7, reticulocytes 1.2%, iron  saturation 7%, B12 432, ferritin 8 08/10/23: Hb 13.4, Platelet 70   Previous work-up was negative hepatitis B and C. Hepatitis A showed seropositivity Severe fatigue: We will check for lupus. Neutropenia: 09/09/2023: ANA negative anti-smooth muscle antibody negative, anti-double-stranded DNA antibody negative: Reticulocyte count: 1.4%, ferritin 12, iron  saturation 13%   Treatment plan: No role of IV iron  at this time. No evidence of lupus to explain the cause of the neutropenia. Prior gastric bypass surgery  Labs need to be scheduled.

## 2023-10-07 ENCOUNTER — Other Ambulatory Visit: Payer: Self-pay | Admitting: Internal Medicine

## 2023-10-08 ENCOUNTER — Other Ambulatory Visit (HOSPITAL_BASED_OUTPATIENT_CLINIC_OR_DEPARTMENT_OTHER): Payer: Self-pay

## 2023-10-08 ENCOUNTER — Other Ambulatory Visit: Payer: Self-pay

## 2023-10-08 ENCOUNTER — Encounter: Payer: Self-pay | Admitting: Hematology and Oncology

## 2023-10-08 MED ORDER — VOQUEZNA 20 MG PO TABS
1.0000 | ORAL_TABLET | Freq: Every day | ORAL | 1 refills | Status: DC
Start: 2023-10-08 — End: 2024-01-28
  Filled 2023-10-08 – 2023-11-08 (×2): qty 30, 30d supply, fill #0
  Filled 2023-12-14 – 2024-01-11 (×2): qty 30, 30d supply, fill #1

## 2023-10-08 NOTE — Patient Instructions (Incomplete)
  1.  Treat and prevent inflammation of airway:  A. Breztri  - 2 inhalations 2 times per day  2.  Treat and prevent reflux/LPR/recurrent emesis:   A.  Continue Voquezna , Pepcid , Phenergan   3.  If needed:  A. Albuterol  - 2 inhalations or nebulization every 4-6 hours  B. Cetirizine  10 mg - 1 tab 1-2 times per day C. Desonide  ointment to face -1-2 times per day D. Mometasone  0.1% ointment to body 1-2 times per day E. For now, and for asthma flare, begin budesonide  0.5 mg twice a day via nebulizer for 1-2 weeks or until cough and wheeze free, then stop  4. Labs ordered to help us  evaluate your environmental allergies, venom allergies, and most allergenic foods. We will call when results are available  5. Follow up in 1 month or sooner if needed

## 2023-10-08 NOTE — Progress Notes (Unsigned)
   [address] [place] [address] Dept: [phone]  FOLLOW UP NOTE  Patient ID: Linda Becker, female    DOB: Feb 03, 1991  Age: 33 y.o. MRN: [id number] Date of Office Visit: 10/09/2023  Assessment  Chief Complaint: No chief complaint on file.  HPI Linda Becker is a 33 year old female who presents to the clinic for follow-up visit with allergy skin testing.  She was last seen in this clinic on 07/10/2023 by Dr. Kozlow for evaluation of asthma, allergic rhinitis, atopic dermatitis, and nonspecific skin condition on her right hand.  Her last environmental allergy skin testing was on 09/30/2020 was positive to weed pollen, tree pollen, dog, and dust mite.  Discussed the use of AI scribe software for clinical note transcription with the patient, who gave verbal consent to proceed.  History of Present Illness      Drug Allergies:  Allergies  Allergen Reactions   Progesterone Rash and Other (See Comments)    Was in a form of birth control   Penicillins Rash and Other (See Comments)    Did it involve swelling of the face/tongue/throat, SOB, or low BP? No Did it involve sudden or severe rash/hives, skin peeling, or any reaction on the inside of your mouth or nose? Yes Did you need to seek medical attention at a hospital or doctor's office? No When did it last happen?      9 + months If all above answers are "NO", may proceed with cephalosporin use.      Physical Exam: There were no vitals taken for this visit.   Physical Exam  Diagnostics:    Assessment and Plan: No diagnosis found.  No orders of the defined types were placed in this encounter.   There are no Patient Instructions on file for this visit.  No follow-ups on file.    Thank you for the opportunity to care for this patient.  Please do not hesitate to contact me with questions.  Marinus Sic, FNP [facility]

## 2023-10-09 ENCOUNTER — Other Ambulatory Visit: Payer: Self-pay

## 2023-10-09 ENCOUNTER — Encounter: Payer: Self-pay | Admitting: Family Medicine

## 2023-10-09 ENCOUNTER — Telehealth: Payer: Self-pay

## 2023-10-09 ENCOUNTER — Other Ambulatory Visit (HOSPITAL_COMMUNITY): Payer: Self-pay

## 2023-10-09 ENCOUNTER — Ambulatory Visit (INDEPENDENT_AMBULATORY_CARE_PROVIDER_SITE_OTHER): Admitting: Family Medicine

## 2023-10-09 VITALS — BP 114/62 | HR 95 | Temp 98.3°F | Resp 16

## 2023-10-09 DIAGNOSIS — J4551 Severe persistent asthma with (acute) exacerbation: Secondary | ICD-10-CM

## 2023-10-09 DIAGNOSIS — Z91038 Other insect allergy status: Secondary | ICD-10-CM

## 2023-10-09 DIAGNOSIS — J3089 Other allergic rhinitis: Secondary | ICD-10-CM

## 2023-10-09 DIAGNOSIS — R21 Rash and other nonspecific skin eruption: Secondary | ICD-10-CM | POA: Diagnosis not present

## 2023-10-09 DIAGNOSIS — J302 Other seasonal allergic rhinitis: Secondary | ICD-10-CM | POA: Insufficient documentation

## 2023-10-09 MED ORDER — BUDESONIDE 0.5 MG/2ML IN SUSP
RESPIRATORY_TRACT | 1 refills | Status: AC
Start: 2023-10-09 — End: ?
  Filled 2023-10-09: qty 120, 30d supply, fill #0

## 2023-10-09 NOTE — Telephone Encounter (Signed)
 Pharmacy Patient Advocate Encounter  Received notification from [facility] that Prior Authorization for Voquezna  20MG  tablets has been APPROVED from 10-09-2023 to 10-08-2024   PA #/Case ID/Reference #: [id number]

## 2023-10-09 NOTE — Telephone Encounter (Signed)
 Pharmacy Patient Advocate Encounter   Received notification from CoverMyMeds that prior authorization for Voquezna  20MG  tablets is required/requested.   Insurance verification completed.   The patient is insured through [facility] .   Per test claim: PA required; PA submitted to above mentioned insurance via CoverMyMeds Key/confirmation #/EOC [id number] Status is pending

## 2023-10-10 ENCOUNTER — Other Ambulatory Visit (HOSPITAL_BASED_OUTPATIENT_CLINIC_OR_DEPARTMENT_OTHER): Payer: Self-pay

## 2023-10-12 ENCOUNTER — Other Ambulatory Visit: Payer: Self-pay

## 2023-10-12 ENCOUNTER — Ambulatory Visit: Admitting: Orthopaedic Surgery

## 2023-10-14 LAB — HYMENOPTERA VENOM ALLERGY II

## 2023-10-15 ENCOUNTER — Other Ambulatory Visit: Payer: Self-pay

## 2023-10-15 ENCOUNTER — Encounter (HOSPITAL_BASED_OUTPATIENT_CLINIC_OR_DEPARTMENT_OTHER): Payer: Self-pay

## 2023-10-15 ENCOUNTER — Other Ambulatory Visit (HOSPITAL_BASED_OUTPATIENT_CLINIC_OR_DEPARTMENT_OTHER): Payer: Self-pay

## 2023-10-15 LAB — HYMENOPTERA VENOM ALLERGY II

## 2023-10-16 LAB — IGE+ALLERGENS ZONE 3(27)
Alternaria Alternata IgE: <0.1 kU/L
Aspergillus Fumigatus IgE: <0.1 kU/L
Bahia Grass IgE: <0.1 kU/L
Bermuda Grass IgE: <0.1 kU/L
Cat Dander IgE: 0.79 kU/L — AB
Cedar, Mountain IgE: <0.1 kU/L
Cladosporium Herbarum IgE: <0.1 kU/L
Cockroach, American IgE: <0.1 kU/L
Common Silver Birch IgE: 0.47 kU/L — AB
D Farinae IgE: 0.48 kU/L — AB
D Pteronyssinus IgE: 0.27 kU/L — AB
Dog Dander IgE: 5.5 kU/L — AB
Elm, American IgE: <0.1 kU/L
Hickory, White IgE: 0.13 kU/L — AB
IgE (Immunoglobulin E), Serum: 112 [IU]/mL (ref 6–495)
Johnson Grass IgE: <0.1 kU/L
Kentucky Bluegrass IgE: <0.1 kU/L
Maple/Box Elder IgE: <0.1 kU/L
Mucor Racemosus IgE: <0.1 kU/L
Nettle IgE: <0.1 kU/L
Oak, White IgE: <0.1 kU/L
Penicillium Chrysogen IgE: <0.1 kU/L
Pigweed, Rough IgE: <0.1 kU/L
Plantain, English IgE: <0.1 kU/L
Ragweed, Short IgE: <0.1 kU/L
Stemphylium Herbarum IgE: <0.1 kU/L
White Mulberry IgE: <0.1 kU/L

## 2023-10-16 LAB — HYMENOPTERA VENOM ALLERGY II
Bumblebee: <0.1 kU/L
Hornet, White Face, IgE: 0.17 kU/L — AB
Hornet, Yellow, IgE: <0.1 kU/L
I001-IgE Honeybee: <0.1 kU/L
I003-IgE Yellow Jacket: <0.1 kU/L
I004-IgE Paper Wasp: <0.1 kU/L
I208-IgE Api m 1: <0.1 kU/L
I209-IgE Ves v 5: <0.1 kU/L
I210-IgE Pol d 5: <0.1 kU/L
I211-IgE Ves v 1: <0.1 kU/L
I214-IgE Api m 2: <0.1 kU/L
I215-IgE Api m 3: <0.1 kU/L
I216-IgE Api m 5: <0.1 kU/L
I217-IgE Api m 10: <0.1 kU/L
Tryptase: 8.2 ug/L (ref 2.2–13.2)

## 2023-10-16 LAB — FOOD ALLERGY PROFILE
Allergen Corn, IgE: <0.1 kU/L
Clam IgE: <0.1 kU/L
Codfish IgE: <0.1 kU/L
Egg White IgE: 0.18 kU/L — AB
Milk IgE: 0.31 kU/L — AB
Peanut IgE: <0.1 kU/L
Scallop IgE: <0.1 kU/L
Sesame Seed IgE: 0.12 kU/L — AB
Shrimp IgE: <0.1 kU/L
Soybean IgE: <0.1 kU/L
Walnut IgE: <0.1 kU/L
Wheat IgE: 0.12 kU/L — AB

## 2023-10-16 LAB — ALLERGEN COMPONENT COMMENTS

## 2023-10-19 ENCOUNTER — Telehealth: Payer: Self-pay | Admitting: Family Medicine

## 2023-10-19 ENCOUNTER — Other Ambulatory Visit (HOSPITAL_COMMUNITY): Payer: Self-pay

## 2023-10-19 ENCOUNTER — Other Ambulatory Visit (HOSPITAL_BASED_OUTPATIENT_CLINIC_OR_DEPARTMENT_OTHER): Payer: Self-pay

## 2023-10-19 NOTE — Progress Notes (Signed)
 Can you please let this patient know that the environmental testing was positive to dog, cat, dust mite, and tree pollen. Food allergy  testing was borderline positive to egg, milk, wheat, and sesame. Please find out if she has any trouble with these foods. She can remove these foods from her diet and see if symptoms resolve. If removing the foods does not make any difference in her symptoms she can add them back one at a time with a week in between foods. Hymenoptera panel was borderline positive to white faced hornet. Would recommend skin testing to the hymenoptera panel in the [place] [facility]. Continue to carry epipen . Thank you

## 2023-10-19 NOTE — Telephone Encounter (Signed)
 Pt called to review vm from Ambs, NP

## 2023-10-20 NOTE — Telephone Encounter (Signed)
 Labs reviewed with patient and recommendations made.

## 2023-10-22 ENCOUNTER — Other Ambulatory Visit (HOSPITAL_COMMUNITY): Payer: Self-pay

## 2023-10-22 ENCOUNTER — Encounter: Payer: Self-pay | Admitting: Hematology and Oncology

## 2023-10-22 MED ORDER — FREESTYLE LIBRE 3 PLUS SENSOR MISC
1.0000 | 3 refills | Status: DC
  Filled 2023-10-22 – 2023-10-28 (×2): qty 2, 30d supply, fill #0
  Filled 2023-11-03: qty 6, 84d supply, fill #0
  Filled 2023-11-06 – 2023-11-08 (×2): qty 2, 30d supply, fill #0

## 2023-10-22 MED ORDER — ONETOUCH VERIO VI STRP
ORAL_STRIP | 3 refills | Status: AC
  Filled 2023-10-22: qty 50, 25d supply, fill #0
  Filled 2023-12-31: qty 50, 25d supply, fill #1
  Filled 2024-01-20: qty 50, 25d supply, fill #2
  Filled 2024-02-09: qty 50, 25d supply, fill #3
  Filled 2024-03-21 – 2024-03-23 (×2): qty 50, 25d supply, fill #4
  Filled 2024-04-07: qty 200, 100d supply, fill #5
  Filled 2024-05-06 – 2024-05-24 (×3): qty 50, 25d supply, fill #5

## 2023-10-22 MED ORDER — ACCU-CHEK GUIDE W/DEVICE KIT
PACK | 0 refills | Status: DC
Start: 2023-10-22 — End: 2024-01-20
  Filled 2023-10-22: qty 1, 30d supply, fill #0

## 2023-10-22 MED ORDER — ONETOUCH DELICA PLUS LANCET33G MISC
2.0000 | 3 refills | Status: AC
  Filled 2023-10-22: qty 100, 50d supply, fill #0
  Filled 2023-12-31: qty 100, 50d supply, fill #1
  Filled 2024-01-20 – 2024-02-09 (×2): qty 100, 50d supply, fill #2
  Filled 2024-03-21: qty 100, 50d supply, fill #3
  Filled 2024-03-23: qty 200, 100d supply, fill #3
  Filled 2024-04-07 – 2024-05-06 (×2): qty 100, 50d supply, fill #3
  Filled 2024-05-24: qty 200, 100d supply, fill #3
  Filled 2024-05-25 (×2): qty 100, 50d supply, fill #3

## 2023-10-23 ENCOUNTER — Other Ambulatory Visit (HOSPITAL_BASED_OUTPATIENT_CLINIC_OR_DEPARTMENT_OTHER): Payer: Self-pay

## 2023-10-23 ENCOUNTER — Encounter (HOSPITAL_COMMUNITY): Payer: Self-pay

## 2023-10-23 ENCOUNTER — Other Ambulatory Visit (HOSPITAL_COMMUNITY): Payer: Self-pay

## 2023-10-23 ENCOUNTER — Encounter (HOSPITAL_BASED_OUTPATIENT_CLINIC_OR_DEPARTMENT_OTHER): Payer: Self-pay

## 2023-10-24 ENCOUNTER — Other Ambulatory Visit (HOSPITAL_COMMUNITY): Payer: Self-pay

## 2023-10-25 ENCOUNTER — Telehealth: Admitting: Physician Assistant

## 2023-10-25 ENCOUNTER — Encounter: Payer: Self-pay | Admitting: Physician Assistant

## 2023-10-25 DIAGNOSIS — B379 Candidiasis, unspecified: Secondary | ICD-10-CM | POA: Diagnosis not present

## 2023-10-25 DIAGNOSIS — T3695XA Adverse effect of unspecified systemic antibiotic, initial encounter: Secondary | ICD-10-CM | POA: Diagnosis not present

## 2023-10-25 MED ORDER — FLUCONAZOLE 150 MG PO TABS: 150.0000 mg | ORAL_TABLET | ORAL | 0 refills | Status: DC | PRN

## 2023-10-25 NOTE — Progress Notes (Signed)

## 2023-10-28 ENCOUNTER — Other Ambulatory Visit (HOSPITAL_COMMUNITY): Payer: Self-pay

## 2023-10-28 ENCOUNTER — Telehealth (HOSPITAL_COMMUNITY): Admitting: Physician Assistant

## 2023-10-28 ENCOUNTER — Encounter (HOSPITAL_COMMUNITY): Payer: Self-pay

## 2023-10-29 ENCOUNTER — Other Ambulatory Visit (HOSPITAL_BASED_OUTPATIENT_CLINIC_OR_DEPARTMENT_OTHER): Payer: Self-pay

## 2023-10-30 ENCOUNTER — Ambulatory Visit (HOSPITAL_COMMUNITY): Admitting: Mental Health

## 2023-10-30 ENCOUNTER — Encounter (HOSPITAL_COMMUNITY): Payer: Self-pay

## 2023-11-03 ENCOUNTER — Other Ambulatory Visit (HOSPITAL_BASED_OUTPATIENT_CLINIC_OR_DEPARTMENT_OTHER): Payer: Self-pay

## 2023-11-03 ENCOUNTER — Encounter (INDEPENDENT_AMBULATORY_CARE_PROVIDER_SITE_OTHER): Payer: Self-pay

## 2023-11-06 ENCOUNTER — Other Ambulatory Visit (HOSPITAL_BASED_OUTPATIENT_CLINIC_OR_DEPARTMENT_OTHER): Payer: Self-pay

## 2023-11-07 ENCOUNTER — Other Ambulatory Visit: Payer: Self-pay | Admitting: Allergy and Immunology

## 2023-11-07 ENCOUNTER — Other Ambulatory Visit: Payer: Self-pay | Admitting: Internal Medicine

## 2023-11-08 ENCOUNTER — Other Ambulatory Visit: Payer: Self-pay | Admitting: Family

## 2023-11-08 ENCOUNTER — Other Ambulatory Visit (HOSPITAL_COMMUNITY): Payer: Self-pay | Admitting: Physician Assistant

## 2023-11-08 DIAGNOSIS — F99 Mental disorder, not otherwise specified: Secondary | ICD-10-CM

## 2023-11-08 DIAGNOSIS — F411 Generalized anxiety disorder: Secondary | ICD-10-CM

## 2023-11-08 DIAGNOSIS — F4310 Post-traumatic stress disorder, unspecified: Secondary | ICD-10-CM

## 2023-11-08 DIAGNOSIS — F3181 Bipolar II disorder: Secondary | ICD-10-CM

## 2023-11-08 DIAGNOSIS — F5105 Insomnia due to other mental disorder: Secondary | ICD-10-CM

## 2023-11-08 DIAGNOSIS — B379 Candidiasis, unspecified: Secondary | ICD-10-CM

## 2023-11-10 ENCOUNTER — Encounter (HOSPITAL_BASED_OUTPATIENT_CLINIC_OR_DEPARTMENT_OTHER): Payer: Self-pay

## 2023-11-10 ENCOUNTER — Other Ambulatory Visit (HOSPITAL_BASED_OUTPATIENT_CLINIC_OR_DEPARTMENT_OTHER): Payer: Self-pay

## 2023-11-10 ENCOUNTER — Telehealth: Payer: Self-pay

## 2023-11-10 ENCOUNTER — Other Ambulatory Visit: Payer: Self-pay

## 2023-11-10 ENCOUNTER — Other Ambulatory Visit (HOSPITAL_COMMUNITY): Payer: Self-pay

## 2023-11-10 MED ORDER — GABAPENTIN 400 MG PO CAPS
400.0000 mg | ORAL_CAPSULE | Freq: Three times a day (TID) | ORAL | 1 refills | Status: DC
Start: 2023-11-10 — End: 2024-03-02
  Filled 2023-11-10 – 2023-11-23 (×2): qty 90, 30d supply, fill #0
  Filled 2023-12-14 – 2023-12-20 (×2): qty 90, 30d supply, fill #1

## 2023-11-10 MED ORDER — ESCITALOPRAM OXALATE 10 MG PO TABS
10.0000 mg | ORAL_TABLET | Freq: Every day | ORAL | 1 refills | Status: DC
  Filled 2023-11-10: qty 30, 30d supply, fill #0
  Filled 2023-12-14: qty 30, 30d supply, fill #1

## 2023-11-10 MED ORDER — TRAZODONE HCL 50 MG PO TABS
50.0000 mg | ORAL_TABLET | Freq: Every day | ORAL | 1 refills | Status: DC
  Filled 2023-11-10: qty 30, 30d supply, fill #0
  Filled 2023-12-14: qty 30, 30d supply, fill #1

## 2023-11-10 MED ORDER — CARIPRAZINE HCL 3 MG PO CAPS
3.0000 mg | ORAL_CAPSULE | Freq: Every day | ORAL | 1 refills | Status: DC
  Filled 2023-11-10: qty 30, 30d supply, fill #0
  Filled 2023-12-14: qty 30, 30d supply, fill #1

## 2023-11-10 MED ORDER — PROMETHAZINE HCL 25 MG PO TABS
25.0000 mg | ORAL_TABLET | Freq: Four times a day (QID) | ORAL | 1 refills | Status: DC | PRN
  Filled 2023-11-10: qty 120, 30d supply, fill #0
  Filled 2023-11-23 – 2023-12-04 (×4): qty 120, 30d supply, fill #1

## 2023-11-10 NOTE — Telephone Encounter (Signed)
*  Asthma/Allergy   Pharmacy Patient Advocate Encounter   Received notification from CoverMyMeds that prior authorization for Breztri  Aerosphere 160-9-4.8MCG/ACT aerosol  is required/requested.   Insurance verification completed.   The patient is insured through [facility] .   Per test claim: PA required; PA submitted to above mentioned insurance via CoverMyMeds Key/confirmation #/EOC [id number] Status is pending

## 2023-11-10 NOTE — Progress Notes (Unsigned)
   [address] [place] [address] Dept: [phone]  FOLLOW UP NOTE  Patient ID: Linda Becker, female    DOB: 06-24-1990  Age: 33 y.o. MRN: [id number] Date of Office Visit: 11/12/2023  Assessment  Chief Complaint: No chief complaint on file.  HPI Linda Becker is a 33 year old female who presents to the clinic for follow-up visit.  She was last seen in this clinic on 10/09/2023 Marinus Sic, FNP, for evaluation of asthma, allergic rhinitis, atopic dermatitis, reflux, stinging insect allergy , and possible food allergy .  Her last environmental allergy  testing via lab was on 10/09/2023 it was positive to cat, dog, dust mite, and tree.  Her last food allergy  testing via lab on 10/09/2023 was borderline positive to egg, milk, wheat, and sesame.  Her last stinging insect lab was borderline positive to white faced hornet on 10/09/2023.  Discussed the use of AI scribe software for clinical note transcription with the patient, who gave verbal consent to proceed.  History of Present Illness      Drug Allergies:  Allergies  Allergen Reactions   Progesterone Rash and Other (See Comments)    Was in a form of birth control   Penicillins Rash and Other (See Comments)    Did it involve swelling of the face/tongue/throat, SOB, or low BP? No Did it involve sudden or severe rash/hives, skin peeling, or any reaction on the inside of your mouth or nose? Yes Did you need to seek medical attention at a hospital or doctor's office? No When did it last happen?      9 + months If all above answers are "NO", may proceed with cephalosporin use.      Physical Exam: There were no vitals taken for this visit.   Physical Exam  Diagnostics:    Assessment and Plan: No diagnosis found.  No orders of the defined types were placed in this encounter.   There are no Patient Instructions on file for this visit.  No follow-ups on file.    Thank you for the opportunity to care for this  patient.  Please do not hesitate to contact me with questions.  Marinus Sic, FNP [facility]

## 2023-11-10 NOTE — Patient Instructions (Incomplete)
  1.  Treat and prevent inflammation of airway:  A. Trelegy 200 - 1 inhalation 1 time per day  2.  Treat and prevent reflux/LPR/recurrent emesis:   A.  Continue Voquezna , Pepcid , Phenergan   3.  If needed:  A. Albuterol  - 2 inhalations or nebulization every 4-6 hours  B. Cetirizine  10 mg - 1 tab 1-2 times per day C. Desonide  ointment to face -1-2 times per day D. Mometasone  0.1% ointment to body 1-2 times per day E. For asthma flare, begin budesonide  0.5 mg twice a day via nebulizer for 1-2 weeks or until cough and wheeze free, then stop  5. Follow up in 2 months or sooner if needed

## 2023-11-11 ENCOUNTER — Other Ambulatory Visit (HOSPITAL_BASED_OUTPATIENT_CLINIC_OR_DEPARTMENT_OTHER): Payer: Self-pay

## 2023-11-11 MED ORDER — ALBUTEROL SULFATE HFA 108 (90 BASE) MCG/ACT IN AERS
2.0000 | INHALATION_SPRAY | RESPIRATORY_TRACT | 1 refills | Status: DC | PRN
  Filled 2023-11-11: qty 18, 17d supply, fill #0
  Filled 2023-12-02: qty 18, 17d supply, fill #1

## 2023-11-12 ENCOUNTER — Other Ambulatory Visit: Payer: Self-pay

## 2023-11-12 ENCOUNTER — Other Ambulatory Visit (HOSPITAL_BASED_OUTPATIENT_CLINIC_OR_DEPARTMENT_OTHER): Payer: Self-pay

## 2023-11-12 ENCOUNTER — Encounter: Payer: Self-pay | Admitting: Family Medicine

## 2023-11-12 ENCOUNTER — Ambulatory Visit (INDEPENDENT_AMBULATORY_CARE_PROVIDER_SITE_OTHER): Admitting: Family Medicine

## 2023-11-12 VITALS — BP 100/68 | HR 107 | Temp 98.3°F | Resp 12 | Ht 66.0 in | Wt 203.6 lb

## 2023-11-12 DIAGNOSIS — L989 Disorder of the skin and subcutaneous tissue, unspecified: Secondary | ICD-10-CM | POA: Diagnosis not present

## 2023-11-12 DIAGNOSIS — J4540 Moderate persistent asthma, uncomplicated: Secondary | ICD-10-CM | POA: Diagnosis not present

## 2023-11-12 DIAGNOSIS — R21 Rash and other nonspecific skin eruption: Secondary | ICD-10-CM | POA: Diagnosis not present

## 2023-11-12 DIAGNOSIS — J3089 Other allergic rhinitis: Secondary | ICD-10-CM | POA: Diagnosis not present

## 2023-11-12 DIAGNOSIS — Z91038 Other insect allergy status: Secondary | ICD-10-CM | POA: Insufficient documentation

## 2023-11-12 DIAGNOSIS — J302 Other seasonal allergic rhinitis: Secondary | ICD-10-CM

## 2023-11-12 MED ORDER — TRELEGY ELLIPTA 200-62.5-25 MCG/ACT IN AEPB
1.0000 | INHALATION_SPRAY | Freq: Every day | RESPIRATORY_TRACT | 5 refills | Status: DC
  Filled 2023-11-12: qty 60, 30d supply, fill #0

## 2023-11-12 NOTE — Addendum Note (Signed)
 Addended by: Minor Amble on: 11/12/2023 05:45 PM   Modules accepted: Orders

## 2023-11-12 NOTE — Telephone Encounter (Signed)
 Resubmitting due to issues with CMM  New Key: [id number]

## 2023-11-13 ENCOUNTER — Other Ambulatory Visit (HOSPITAL_COMMUNITY): Payer: Self-pay

## 2023-11-13 ENCOUNTER — Telehealth: Payer: Self-pay

## 2023-11-13 ENCOUNTER — Other Ambulatory Visit (HOSPITAL_BASED_OUTPATIENT_CLINIC_OR_DEPARTMENT_OTHER): Payer: Self-pay

## 2023-11-13 MED ORDER — MOMETASONE FURO-FORMOTEROL FUM 200-5 MCG/ACT IN AERO
2.0000 | INHALATION_SPRAY | Freq: Two times a day (BID) | RESPIRATORY_TRACT | 5 refills | Status: AC
  Filled 2023-11-13 – 2024-05-25 (×3): qty 13, 30d supply, fill #0

## 2023-11-13 MED ORDER — SPIRIVA RESPIMAT 1.25 MCG/ACT IN AERS
2.0000 | INHALATION_SPRAY | Freq: Every day | RESPIRATORY_TRACT | 5 refills | Status: AC
  Filled 2023-11-13 – 2024-05-25 (×3): qty 4, 30d supply, fill #0

## 2023-11-13 NOTE — Telephone Encounter (Signed)
 Pharmacy Patient Advocate Encounter  Received notification from [employer] that Prior Authorization for Trelegy Ellipta has been DENIED.  Full denial letter will be uploaded to the media tab. See denial reason below.  Per your health plan's criteria, this drug is covered if you meet the following: One of the following: (1) You have failed two preferred drugs as confirmed by claims history or submission of medical records. The preferred drugs: Advair Diskus, Advair HFA inhaler, Dulera  inhaler, Symbicortinhaler, Spiriva  Respimat inhalation spray. (2) You cannot use two preferred drugs (please specify contraindication or intolerance). The information provided does not show that you meet the criteria listed above. Please speak with your doctor about your choices. This decision was made per the [facility]  NonPreferred Drugs Guideline

## 2023-11-13 NOTE — Telephone Encounter (Signed)
 Addressed in a different encounter

## 2023-11-13 NOTE — Telephone Encounter (Signed)
 Pharmacy Patient Advocate Encounter  Received notification from [employer] that Prior Authorization for Breztri  has been DENIED.  Full denial letter will be uploaded to the media tab. See denial reason below.  The use of this drug is supported (for a Food and Drug Administration-approved indication or by an appropriate compendia of current literature): a lung condition that makes it hard to breathe (chronic obstructive pulmonary disease). The information provided does not show that you meet the criteria listed above

## 2023-11-13 NOTE — Telephone Encounter (Signed)
 Can you please order Dulera  200-2 puffs twice a day with a spacer and Spiriva  1.25 mcg 2 puffs once a day to replace Trelegy and Breztri  since neither of these inhalers are covered. Thank you

## 2023-11-13 NOTE — Telephone Encounter (Signed)
*  Asthma/Allergy   Pharmacy Patient Advocate Encounter   Received notification from CoverMyMeds that prior authorization for Trelegy Ellipta 200-62.5-25MCG/ACT aerosol powder  is required/requested.   Insurance verification completed.   The patient is insured through [facility] .   Per test claim: PA required; PA submitted to above mentioned insurance via CoverMyMeds Key/confirmation #/EOC [facility] Status is pending

## 2023-11-13 NOTE — Addendum Note (Signed)
 Addended by: Larell Baney on: 11/13/2023 02:44 PM   Modules accepted: Orders

## 2023-11-13 NOTE — Telephone Encounter (Signed)
 Prescriptions sent. Patient informed and verbalized understanding.

## 2023-11-14 ENCOUNTER — Other Ambulatory Visit (HOSPITAL_BASED_OUTPATIENT_CLINIC_OR_DEPARTMENT_OTHER): Payer: Self-pay

## 2023-11-18 ENCOUNTER — Other Ambulatory Visit (HOSPITAL_BASED_OUTPATIENT_CLINIC_OR_DEPARTMENT_OTHER): Payer: Self-pay

## 2023-11-19 ENCOUNTER — Other Ambulatory Visit: Payer: Self-pay

## 2023-11-19 ENCOUNTER — Other Ambulatory Visit (HOSPITAL_COMMUNITY): Payer: Self-pay

## 2023-11-19 MED ORDER — AZITHROMYCIN 250 MG PO TABS
ORAL_TABLET | ORAL | 0 refills | Status: DC
  Filled 2023-11-19: qty 6, 5d supply, fill #0

## 2023-11-19 MED ORDER — ACETAMINOPHEN-CODEINE 300-30 MG PO TABS
1.0000 | ORAL_TABLET | Freq: Four times a day (QID) | ORAL | 0 refills | Status: DC
  Filled 2023-11-19: qty 12, 3d supply, fill #0

## 2023-11-21 ENCOUNTER — Other Ambulatory Visit (HOSPITAL_BASED_OUTPATIENT_CLINIC_OR_DEPARTMENT_OTHER): Payer: Self-pay

## 2023-11-23 ENCOUNTER — Other Ambulatory Visit: Payer: Self-pay

## 2023-11-23 ENCOUNTER — Other Ambulatory Visit (HOSPITAL_BASED_OUTPATIENT_CLINIC_OR_DEPARTMENT_OTHER): Payer: Self-pay

## 2023-11-24 ENCOUNTER — Other Ambulatory Visit (HOSPITAL_BASED_OUTPATIENT_CLINIC_OR_DEPARTMENT_OTHER): Payer: Self-pay

## 2023-11-28 ENCOUNTER — Other Ambulatory Visit (HOSPITAL_BASED_OUTPATIENT_CLINIC_OR_DEPARTMENT_OTHER): Payer: Self-pay

## 2023-11-30 ENCOUNTER — Other Ambulatory Visit (HOSPITAL_BASED_OUTPATIENT_CLINIC_OR_DEPARTMENT_OTHER): Payer: Self-pay

## 2023-12-01 NOTE — Progress Notes (Signed)
 Chief Complaint: Cirrhosis follow up Primary GI MD: Dr. Bridgett Camps  HPI: Discussed the use of AI scribe software for clinical note transcription with the patient, who gave verbal consent to proceed.  History of Present Illness Linda Becker is a 33 year old female with cirrhosis who presents with significant weight changes and gastrointestinal symptoms.  She has experienced a significant weight change over the past month, increasing from approximately 175 pounds to 203 pounds. She is concerned about fluid retention but reports no pitting edema or fluid accumulation in her abdomen or legs.  She has a history of cirrhosis, previously referred to as nonalcoholic fatty liver disease, and has not seen her specialist at [facility] for some time. She is here to catch up on necessary screenings, including an ultrasound and colonoscopy.  Chronic gastrointestinal symptoms include significant nausea and vomiting, persistent since her gastric sleeve surgery four years ago and an upper GI bleed. She relies on Phenergan  for nausea management. She experiences epigastric pain, particularly after eating, described as discomfort and pain from her belly button upwards.  Her medical history includes gallbladder sludge and cholestasis of pregnancy, which she associates with past pregnancy complications. She has not had a HIDA scan to assess gallbladder function.  She has a history of an upper GI bleed and varices, which have not required banding since her second procedure. She is due for a colonoscopy, as the last attempt was incomplete due to vomiting during the prep, which included slight blood.  She mentions a family history of gallbladder removal and expresses surprise that her gallbladder has not been removed, given her symptoms and family history.    PREVIOUS GI WORKUP   Colonoscopy 03/2023 for IDA and previous SSP - Preparation of the colon was poor.  - No specimens collected. - Repeat at next  appointment with 2-day prep and 7-day hold of iron   EGD 03/2023 - Small ( < 5 mm) esophageal varices.  - 2 cm hiatal hernia.  - Non- bleeding gastric ulcers with no stigmata of bleeding. Biopsied.  - Prior gastric sleeve.  - Duodenitis. Biopsied.  FINAL DIAGNOSIS        1. Surgical [P], small bowel :       - BENIGN SMALL BOWEL MUCOSA WITH NO SIGNIFICANT PATHOLOGIC CHANGES        2. Surgical [P], stomach ulcers :       - REACTIVE GASTROPATHY WITH MILD PATCHY CHRONIC INACTIVE GASTRITIS       - NEGATIVE FOR H. PYLORI ON IMMUNOHISTOCHEMICAL STAIN       - NEGATIVE FOR INTESTINAL METAPLASIA, DYSPLASIA OR MALIGNANCY   Past Medical History:  Diagnosis Date   Allergic rhinitis    remote hx of allergy  shots   Asthma    since childhood  on controller meds extrinsic dr Kandi Oris   Bipolar II disorder (HCC)    Cirrhosis (HCC)    Erosive esophagitis    Esophageal varices (HCC)    Family history of adverse reaction to anesthesia    mom and sister difficult to awaken per patient    Gallbladder sludge    Gastropathy    GERD (gastroesophageal reflux disease)    on nexium   for  long term sx since childhood   H/O miscarriage, not currently pregnant    [redacted] weeks  march 2016   Hepatic steatosis    Hiatal hernia    Hypothyroidism    Migraine    Murmur    pt reports MVP   Portal  hypertension (HCC)    Sessile colonic polyp    Splenomegaly    Steatohepatitis    Syncope    under eval ? cause   Tachycardia    episodes with near syncope eval dr Belma Boxer  on no meds dced LABA   Thrombocytopenia (HCC)    Upper GI bleed     Past Surgical History:  Procedure Laterality Date   BIOPSY  09/03/2018   Procedure: BIOPSY;  Surgeon: Nannette Babe, MD;  Location: [facility];  Service: Gastroenterology;;   BIOPSY  09/26/2020   Procedure: BIOPSY;  Surgeon: Nannette Babe, MD;  Location: [facility];  Service: Gastroenterology;;   BIOPSY  05/12/2022   Procedure: BIOPSY;  Surgeon: Nannette Babe, MD;   Location: [facility];  Service: Gastroenterology;;   broken right femur  2008   rod placed   COLONOSCOPY     COLONOSCOPY WITH ESOPHAGOGASTRODUODENOSCOPY (EGD)  03/24/2023   [facility]   COLONOSCOPY WITH PROPOFOL  N/A 09/03/2018   Procedure: COLONOSCOPY WITH PROPOFOL ;  Surgeon: Nannette Babe, MD;  Location: [facility];  Service: Gastroenterology;  Laterality: N/A;   DILATION AND CURETTAGE OF UTERUS     ESOPHAGOGASTRODUODENOSCOPY (EGD) WITH PROPOFOL  N/A 09/03/2018   Procedure: ESOPHAGOGASTRODUODENOSCOPY (EGD) WITH PROPOFOL ;  Surgeon: Nannette Babe, MD;  Location: [facility];  Service: Gastroenterology;  Laterality: N/A;   ESOPHAGOGASTRODUODENOSCOPY (EGD) WITH PROPOFOL  N/A 01/27/2020   Procedure: ESOPHAGOGASTRODUODENOSCOPY (EGD) WITH PROPOFOL ;  Surgeon: Brice Campi Albino Alu., MD;  Location: [facility] ENDOSCOPY;  Service: Gastroenterology;  Laterality: N/A;   ESOPHAGOGASTRODUODENOSCOPY (EGD) WITH PROPOFOL  N/A 03/12/2020   Procedure: ESOPHAGOGASTRODUODENOSCOPY (EGD) WITH PROPOFOL ;  Surgeon: Nannette Babe, MD;  Location: [facility];  Service: Gastroenterology;  Laterality: N/A;   ESOPHAGOGASTRODUODENOSCOPY (EGD) WITH PROPOFOL  N/A 09/26/2020   Procedure: ESOPHAGOGASTRODUODENOSCOPY (EGD) WITH PROPOFOL ;  Surgeon: Nannette Babe, MD;  Location: [facility];  Service: Gastroenterology;  Laterality: N/A;   ESOPHAGOGASTRODUODENOSCOPY (EGD) WITH PROPOFOL  N/A 04/29/2021   Procedure: ESOPHAGOGASTRODUODENOSCOPY (EGD) WITH PROPOFOL ;  Surgeon: Nannette Babe, MD;  [facility]: [facility];  Service: Gastroenterology;  Laterality: N/A;   ESOPHAGOGASTRODUODENOSCOPY (EGD) WITH PROPOFOL  N/A 05/12/2022   Procedure: ESOPHAGOGASTRODUODENOSCOPY (EGD) WITH PROPOFOL ;  Surgeon: Nannette Babe, MD;  Location: [facility];  Service: Gastroenterology;  Laterality: N/A;   GASTRIC VARICES BANDING  01/27/2020   Procedure: GASTRIC VARICES BANDING;  Surgeon: Brice Campi Albino Alu., MD;  [facility];  Service:  Gastroenterology;;   HOT HEMOSTASIS N/A 04/29/2021   Procedure: HOT HEMOSTASIS (ARGON PLASMA COAGULATION/BICAP);  Surgeon: Nannette Babe, MD;  Location: [facility];  Service: Gastroenterology;  Laterality: N/A;   IR TRANSCATHETER BX  01/19/2019   IR US  GUIDE VASC ACCESS RIGHT  01/19/2019   IR VENOGRAM HEPATIC W HEMODYNAMIC EVALUATION  01/19/2019   LAPAROSCOPIC GASTRIC SLEEVE RESECTION N/A 06/28/2019   Procedure: LAPAROSCOPIC GASTRIC SLEEVE RESECTION, Upper Endo, ERAS Pathway;  Surgeon: Adalberto Acton, MD;  Location: [facility];  Service: General;  Laterality: N/A;   OB ultrasound N/A 12/01/2017   see report   POLYPECTOMY  09/03/2018   Procedure: POLYPECTOMY;  Surgeon: Nannette Babe, MD;  Location: [facility];  Service: Gastroenterology;;   TONSILLECTOMY  2006   UPPER GASTROINTESTINAL ENDOSCOPY     WISDOM TOOTH EXTRACTION      Current Outpatient Medications  Medication Sig Dispense Refill   albuterol  (PROVENTIL ) (2.5 MG/3ML) 0.083% nebulizer solution Take 3 mLs (2.5 mg total) by nebulization every 4 (four) hours as needed for wheezing or shortness of breath (coughing fits).  150 mL 1   albuterol  (VENTOLIN  HFA) 108 (90 Base) MCG/ACT inhaler Inhale 2 puffs into the lungs every 4 (four) to 6 (six) hours as needed for cough/wheeze. 18 g 1   Ascorbic Acid  (VITAMIN C ) 1000 MG tablet Take 2,000 mg by mouth 2 (two) times daily.     Blood Glucose Monitoring Suppl (ACCU-CHEK GUIDE) w/Device KIT Use as directed to test blood sugar twice daily. 1 kit 0   budesonide  (PULMICORT ) 0.5 MG/2ML nebulizer solution For asthma flare, begin budesonide  1 vial (0.5 mg) twice a day for 1 - 2 weeks or until cough and wheeze free then stop 120 mL 1   CALCIUM  PO Take 1 tablet by mouth 2 (two) times daily.     cariprazine  (VRAYLAR ) 3 MG capsule Take 1 capsule (3 mg total) by mouth daily. 30 capsule 1   carvedilol  (COREG ) 12.5 MG tablet Take 1 tablet (12.5 mg total) by mouth 2 (two) times daily with a meal. 60 tablet 2    cetirizine  (ZYRTEC ) 10 MG tablet Take 1 tablet (10 mg total) by mouth 2 (two) times daily. 180 tablet 1   Continuous Glucose Sensor (FREESTYLE LIBRE 3 PLUS SENSOR) MISC Apply one sensor every 15 days. 6 each 3   desonide  (DESOWEN ) 0.05 % ointment Apply 1 Application topically 2 (two) times daily. Apply to the face 180 g 1   diphenhydrAMINE  (BENADRYL ) 25 MG tablet Take 50 mg by mouth at bedtime as needed for allergies or sleep.     ergocalciferol  (VITAMIN D2) 1.25 MG (50000 UT) capsule Take 1 capsule (50,000 Units total) by mouth once a week. (Patient taking differently: Take 50,000 Units by mouth every Monday.) 12 capsule 1   escitalopram  (LEXAPRO ) 10 MG tablet Take 1 tablet (10 mg total) by mouth daily. 30 tablet 1   famotidine  (PEPCID ) 40 MG tablet Take 1 tablet (40 mg total) by mouth 2 (two) times daily. Pt need an office visit for further refills. 180 tablet 1   Fluticasone -Umeclidin-Vilant (TRELEGY ELLIPTA ) 200-62.5-25 MCG/ACT AEPB Inhale 1 puff into the lungs daily at 6 (six) AM. 60 each 5   gabapentin  (NEURONTIN ) 400 MG capsule Take 1 capsule (400 mg total) by mouth 3 (three) times daily. 90 capsule 1   glucose blood (ONETOUCH VERIO) test strip Use to test blood sugar twice daily as directed. 200 strip 3   lamoTRIgine  (LAMICTAL ) 25 MG tablet Take 2 tablets (50 mg total) by mouth daily. 60 tablet 1   Lancets (ONETOUCH DELICA PLUS LANCET33G) MISC use to check blood sugar 2 times a day 200 each 3   mometasone  (ELOCON ) 0.1 % ointment Apply topically 2 (two) times daily. Apply to the body 135 g 1   mometasone -formoterol  (DULERA ) 200-5 MCG/ACT AERO 2 puffs twice a day with a spacer 13 g 5   Na Sulfate-K Sulfate-Mg Sulf 17.5-3.13-1.6 GM/177ML SOLN Take as Directed 354 mL 0   naphazoline-pheniramine (EYE ALLERGY  RELIEF) 0.025-0.3 % ophthalmic solution Place 2 drops into both eyes 4 (four) times daily as needed for allergies.     Prenatal Vit-Fe Fumarate-FA (MULTIVITAMIN-PRENATAL) 27-0.8 MG TABS  tablet Take 1 tablet by mouth 2 (two) times daily.     promethazine  (PHENERGAN ) 25 MG tablet Take 1 tablet (25 mg total) by mouth every 6 (six) hours as needed. 120 tablet 1   Respiratory Therapy Supplies (NEBULIZER MASK ADULT) MISC Use as directed 1 each 1   Tiotropium Bromide  Monohydrate (SPIRIVA  RESPIMAT) 1.25 MCG/ACT AERS 2 puffs once a day 4 g  5   traZODone  (DESYREL ) 50 MG tablet Take 1 tablet (50 mg total) by mouth at bedtime. 30 tablet 1   ursodiol  (ACTIGALL ) 500 MG tablet Take 1 tablet (500 mg total) by mouth 3 (three) times daily 90 tablet 11   Vonoprazan Fumarate  (VOQUEZNA ) 20 MG TABS Take 1 tablet by mouth daily. 30 tablet 1   XIFAXAN  550 MG TABS tablet Take 1 tablet (550 mg total) by mouth 2 (two) times daily. 180 tablet 3   acetaminophen -codeine  (TYLENOL  #3) 300-30 MG tablet Take 1 tablet by mouth every 6 (six) hours. (Patient not taking: Reported on 12/02/2023) 12 tablet 0   azelastine  (ASTELIN ) 0.1 % nasal spray Place 1-2 sprays into both nostrils 2 (two) times daily as needed (nasal drainage). Use in each nostril as directed (Patient not taking: Reported on 12/02/2023) 30 mL 2   azithromycin  (ZITHROMAX  Z-PAK) 250 MG tablet Take 2 tablets by mouth the first day then 1 tablet every following day until gone (Patient not taking: Reported on 12/02/2023) 6 tablet 0   fluconazole  (DIFLUCAN ) 150 MG tablet Take 1 tablet (150 mg total) by mouth every three (3) days as needed. (Patient not taking: Reported on 12/02/2023) 3 tablet 0   No current facility-administered medications for this visit.   Facility-Administered Medications Ordered in Other Visits  Medication Dose Route Frequency Provider Last Rate Last Admin   0.9 %  sodium chloride  infusion   Intravenous Once PRN Pyrtle, Amber Bail, MD       albuterol  (VENTOLIN  HFA) 108 (90 Base) MCG/ACT inhaler 2 puff  2 puff Inhalation Once PRN Pyrtle, Amber Bail, MD       diphenhydrAMINE  (BENADRYL ) injection 50 mg  50 mg Intravenous Once PRN Pyrtle, Amber Bail, MD        EPINEPHrine  (EPI-PEN) injection 0.3 mg  0.3 mg Intramuscular Once PRN Pyrtle, Amber Bail, MD       famotidine  (PEPCID ) IVPB 20 mg premix  20 mg Intravenous Once PRN Pyrtle, Amber Bail, MD       iron  sucrose (VENOFER ) 200 mg in sodium chloride  0.9 % 100 mL IVPB  200 mg Intravenous Once Pyrtle, Amber Bail, MD       methylPREDNISolone  sodium succinate (SOLU-MEDROL ) 125 mg/2 mL injection 125 mg  125 mg Intravenous Once PRN Pyrtle, Amber Bail, MD       promethazine  (PHENERGAN ) suppository 12.5-25 mg  12.5-25 mg Rectal Q4H PRN Adalberto Acton, MD        Allergies as of 12/02/2023 - Review Complete 12/02/2023  Allergen Reaction Noted   Progesterone Rash and Other (See Comments) 05/19/2017   Penicillins Rash and Other (See Comments) 02/10/2018    Family History  Problem Relation Age of Onset   Liver disease Mother 66       advanced non-alcoholic cirrhosis   Arthritis Mother    Hyperlipidemia Mother    Heart disease Mother    Hypertension Mother    Diabetes Mother    Kidney disease Mother    Bleeding Disorder Mother    Colon polyps Mother    Asthma Father    Arthritis Father    Hyperlipidemia Father    Heart disease Father    Hypertension Father    Diabetes Father    Colon polyps Father    Breast cancer Maternal Aunt    Breast cancer Paternal Aunt    Esophageal cancer Paternal Aunt    Arthritis Maternal Grandmother    Breast cancer Maternal Grandmother    Thyroid  disease  Maternal Grandmother    Diabetes Maternal Grandfather    Pancreatic cancer Maternal Grandfather    Breast cancer Paternal Grandmother    Diabetes Paternal Grandmother    Colon cancer Paternal Grandfather    Diabetes Paternal Grandfather    Liver cancer Paternal Grandfather    Stomach cancer Paternal Grandfather     Social History   Socioeconomic History   Marital status: Married    Spouse name: Not on file   Number of children: 1   Years of education: Not on file   Highest education level: Not on file  Occupational  History   Not on file  Tobacco Use   Smoking status: Never   Smokeless tobacco: Never  Vaping Use   Vaping status: Never Used  Substance and Sexual Activity   Alcohol use: No    Alcohol/week: 0.0 standard drinks of alcohol   Drug use: No   Sexual activity: Yes    Birth control/protection: None  Other Topics Concern   Not on file  Social History Narrative   6-7 hours of sleep per night   Works full time   Lives with her parents sis and  Infant nephew   [employer]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   Divorced   8 week preg loss Sep 02, 2023     Son passed away 2017-12-08   Social Drivers of Health   Financial Resource Strain: High Risk (05/26/2023)   Overall Financial Resource Strain (CARDIA)    Difficulty of Paying Living Expenses: Very hard  Food Insecurity: Food Insecurity Present (02/24/2023)   Hunger Vital Sign    Worried About Running Out of Food in the Last Year: Often true    Ran Out of Food in the Last Year: Often true  Transportation Needs: No Transportation Needs (02/24/2023)   PRAPARE - Administrator, Civil Service (Medical): No    Lack of Transportation (Non-Medical): No  Physical Activity: Inactive (05/26/2023)   Exercise Vital Sign    Days of Exercise per Week: 0 days    Minutes of Exercise per Session: 0 min  Stress: Stress Concern Present (05/26/2023)   [employer] of Occupational Health - Occupational Stress Questionnaire    Feeling of Stress : Very much  Social Connections: Socially Isolated (05/26/2023)   Social Connection and Isolation Panel    Frequency of Communication with Friends and Family: More than three times a week    Frequency of Social Gatherings with Friends and Family: Twice a week    Attends Religious Services: Never    Database administrator or Organizations: No    Attends Banker Meetings: Never    Marital Status: Separated  Intimate Partner Violence: Not At Risk (02/24/2023)   Humiliation,  Afraid, Rape, and Kick questionnaire    Fear of Current or Ex-Partner: No    Emotionally Abused: No    Physically Abused: No    Sexually Abused: No    Review of Systems:    Constitutional: No weight loss, fever, chills, weakness or fatigue HEENT: Eyes: No change in vision               Ears, Nose, Throat:  No change in hearing or congestion Skin: No rash or itching Cardiovascular: No chest pain, chest pressure or palpitations   Respiratory: No SOB or cough Gastrointestinal: See HPI and otherwise negative Genitourinary: No dysuria or change in urinary frequency Neurological: No headache, dizziness or  syncope Musculoskeletal: No new muscle or joint pain Hematologic: No bleeding or bruising Psychiatric: No history of depression or anxiety    Physical Exam:  Vital signs: BP 122/72   Pulse 94   Ht 5' 6 (1.676 m)   Wt 204 lb (92.5 kg)   BMI 32.93 kg/m   Constitutional: NAD, alert and cooperative Head:  Normocephalic and atraumatic. Eyes:   PEERL, EOMI. No icterus. Conjunctiva pink. Respiratory: Respirations even and unlabored. Lungs clear to auscultation bilaterally.   No wheezes, crackles, or rhonchi.  Cardiovascular:  Regular rate and rhythm. No peripheral edema, cyanosis or pallor.  Gastrointestinal:  Soft, nondistended, epigastric tenderness. No rebound or guarding. Normal bowel sounds. No appreciable masses or hepatomegaly. Rectal:  Declines Msk:  Symmetrical without gross deformities. Without edema, no deformity or joint abnormality.  Neurologic:  Alert and  oriented x4;  grossly normal neurologically.  Skin:   Dry and intact without significant lesions or rashes. Psychiatric: Oriented to person, place and time. Demonstrates good judgement and reason without abnormal affect or behaviors.   RELEVANT LABS AND IMAGING: CBC    Component Value Date/Time   WBC 2.7 (L) 09/09/2023 0945   WBC 3.7 (L) 08/10/2023 1240   RBC 4.42 09/09/2023 0946   RBC 4.44 09/09/2023 0945    HGB 13.3 09/09/2023 0945   HGB 14.7 01/14/2020 1309   HCT 41.9 09/09/2023 0945   HCT 47.1 (H) 01/14/2020 1309   PLT 76 (L) 09/09/2023 0945   PLT 64 (L) 01/14/2020 1309   MCV 94.4 09/09/2023 0945   MCV 100.9 (H) 01/14/2020 1309   MCH 30.0 09/09/2023 0945   MCHC 31.7 09/09/2023 0945   RDW 13.3 09/09/2023 0945   RDW 13.5 01/14/2020 1309   LYMPHSABS 0.9 09/09/2023 0945   LYMPHSABS 1.3 01/14/2020 1309   MONOABS 0.2 09/09/2023 0945   EOSABS 0.2 09/09/2023 0945   EOSABS 0.1 01/14/2020 1309   BASOSABS 0.0 09/09/2023 0945   BASOSABS 0.0 01/14/2020 1309    CMP     Component Value Date/Time   NA 140 08/10/2023 1240   NA 145 01/14/2020 1309   K 5.0 08/10/2023 1240   CL 109 08/10/2023 1240   CO2 24 08/10/2023 1240   GLUCOSE 111 (H) 08/10/2023 1240   BUN 11 08/10/2023 1240   BUN 12 01/14/2020 1309   CREATININE 0.68 08/10/2023 1240   CREATININE 0.84 02/07/2020 1412   CALCIUM  8.9 08/10/2023 1240   PROT 6.7 08/10/2023 1240   PROT 6.2 (L) 01/14/2020 1309   ALBUMIN 4.0 08/10/2023 1240   ALBUMIN 3.9 01/14/2020 1309   AST 18 08/10/2023 1240   AST 31 02/07/2020 1412   ALT 15 08/10/2023 1240   ALT 47 (H) 02/07/2020 1412   ALKPHOS 62 08/10/2023 1240   BILITOT 0.8 08/10/2023 1240   BILITOT 0.3 02/07/2020 1412   GFRNONAA >60 08/10/2023 1240   GFRNONAA >60 02/07/2020 1412   GFRAA >60 02/07/2020 1412     Assessment/Plan:   33 year old female with a history of MASH cirrhosis with portal hypertension, prior esophageal varices with bleeding, thrombocytopenia, history of obesity status post gastric sleeve, asthma, chronic abdominal pain, IDA, GERD, who is here for follow-up   MASH cirrhosis with portal hypertension/history of varices/splenomegaly Following with [facility] hepatology, has not been seen in a long time.  On ursodiol  500 Mg 3 times daily.  MELD 3.0: 10 04/2023.  Up-to-date on variceal screening.  Due for [facility] screening. -- please follow up with [facility] (overdue) -- will get RUQ US   for  [facility] screening and rule out ascites with weight gain (negative on physical exam) -- CBC, CMP, PT/INR  Chronic IDA Seeing hematology.  EGD/colonoscopy 03/2023 with EGD showing nonbleeding gastric ulcers and small varices that were nonbleeding and colonoscopy with poor prep.  Recent lab work with improved anemia and improved iron  studies  GERD with esophagitis and possible EOE Nausea and vomiting Epigastric pain Eosinophilic inflammation suspicious for reflux versus EOE with no benefit from Eohilia . EGD 03/2023 with nonbleeding gastric ulcers, small hiatal hernia, small varices, and duodenitis with negative biopsies.  Currently on voquezna .  Persistent nausea/vomiting and epigastric pain, mainly since gastric sleeve in 2021 but remote history of gallbladder sludge -- repeat EGD for worsening nausea/vomiting and epigastric pain -- continue voquezna  -- RUQ US . If negative for gallstones, please obtain HIDA scan -- continue phenergan    Weight gain 13 lb weight gain since march. Patient denies edema. No obvious ascites/edema on PE -- RUQ US  to evaluate for ascites -- continue to monitor -- follow up with PCP for weight gain  History of colon polyps SSP of colon March 2020.  Repeat colonoscopy March 2024 for IDA with poor prep - Schedule repeat colonoscopy - 2-day prep with 7-day hold of iron  -- I thoroughly discussed the procedure with the patient (at bedside) to include nature of the procedure, alternatives, benefits, and risks (including but not limited to bleeding, infection, perforation, anesthesia/cardiac pulmonary complications).  Patient verbalized understanding and gave verbal consent to proceed with procedure.    Gigi Kyle [facility] 12/02/2023, 8:56 AM  Cc: Margarete Sharps, MD

## 2023-12-02 ENCOUNTER — Encounter: Payer: Self-pay | Admitting: Gastroenterology

## 2023-12-02 ENCOUNTER — Other Ambulatory Visit (INDEPENDENT_AMBULATORY_CARE_PROVIDER_SITE_OTHER)

## 2023-12-02 ENCOUNTER — Other Ambulatory Visit (HOSPITAL_BASED_OUTPATIENT_CLINIC_OR_DEPARTMENT_OTHER): Payer: Self-pay

## 2023-12-02 ENCOUNTER — Ambulatory Visit: Admitting: Gastroenterology

## 2023-12-02 VITALS — BP 122/72 | HR 94 | Ht 66.0 in | Wt 204.0 lb

## 2023-12-02 DIAGNOSIS — R112 Nausea with vomiting, unspecified: Secondary | ICD-10-CM | POA: Diagnosis not present

## 2023-12-02 DIAGNOSIS — K259 Gastric ulcer, unspecified as acute or chronic, without hemorrhage or perforation: Secondary | ICD-10-CM

## 2023-12-02 DIAGNOSIS — K7460 Unspecified cirrhosis of liver: Secondary | ICD-10-CM | POA: Diagnosis not present

## 2023-12-02 DIAGNOSIS — R161 Splenomegaly, not elsewhere classified: Secondary | ICD-10-CM | POA: Diagnosis not present

## 2023-12-02 DIAGNOSIS — I8500 Esophageal varices without bleeding: Secondary | ICD-10-CM | POA: Diagnosis not present

## 2023-12-02 DIAGNOSIS — K2100 Gastro-esophageal reflux disease with esophagitis, without bleeding: Secondary | ICD-10-CM

## 2023-12-02 DIAGNOSIS — K766 Portal hypertension: Secondary | ICD-10-CM

## 2023-12-02 DIAGNOSIS — D509 Iron deficiency anemia, unspecified: Secondary | ICD-10-CM

## 2023-12-02 DIAGNOSIS — K7581 Nonalcoholic steatohepatitis (NASH): Secondary | ICD-10-CM

## 2023-12-02 DIAGNOSIS — Z860101 Personal history of adenomatous and serrated colon polyps: Secondary | ICD-10-CM

## 2023-12-02 DIAGNOSIS — R635 Abnormal weight gain: Secondary | ICD-10-CM

## 2023-12-02 DIAGNOSIS — R1013 Epigastric pain: Secondary | ICD-10-CM | POA: Diagnosis not present

## 2023-12-02 DIAGNOSIS — Z860100 Personal history of colon polyps, unspecified: Secondary | ICD-10-CM

## 2023-12-02 LAB — CBC WITH DIFFERENTIAL/PLATELET
Basophils Absolute: 0 10*3/uL (ref 0.0–0.1)
Basophils Relative: 0.7 % (ref 0.0–3.0)
Eosinophils Absolute: 0.4 10*3/uL (ref 0.0–0.7)
Eosinophils Relative: 11.6 % — ABNORMAL HIGH (ref 0.0–5.0)
HCT: 38.8 % (ref 36.0–46.0)
Hemoglobin: 12.6 g/dL (ref 12.0–15.0)
Lymphocytes Relative: 26.1 % (ref 12.0–46.0)
Lymphs Abs: 0.9 10*3/uL (ref 0.7–4.0)
MCHC: 32.4 g/dL (ref 30.0–36.0)
MCV: 91.9 fl (ref 78.0–100.0)
Monocytes Absolute: 0.3 10*3/uL (ref 0.1–1.0)
Monocytes Relative: 8.1 % (ref 3.0–12.0)
Neutro Abs: 1.7 10*3/uL (ref 1.4–7.7)
Neutrophils Relative %: 53.5 % (ref 43.0–77.0)
Platelets: 68 10*3/uL — ABNORMAL LOW (ref 150.0–400.0)
RBC: 4.22 Mil/uL (ref 3.87–5.11)
RDW: 14.7 % (ref 11.5–15.5)
WBC: 3.3 10*3/uL — ABNORMAL LOW (ref 4.0–10.5)

## 2023-12-02 LAB — COMPREHENSIVE METABOLIC PANEL WITH GFR
ALT: 11 U/L (ref 0–35)
AST: 13 U/L (ref 0–37)
Albumin: 3.8 g/dL (ref 3.5–5.2)
Alkaline Phosphatase: 54 U/L (ref 39–117)
BUN: 11 mg/dL (ref 6–23)
CO2: 30 meq/L (ref 19–32)
Calcium: 8.7 mg/dL (ref 8.4–10.5)
Chloride: 109 meq/L (ref 96–112)
Creatinine, Ser: 1.02 mg/dL (ref 0.40–1.20)
GFR: 73.54 mL/min (ref >60.00)
Glucose, Bld: 88 mg/dL (ref 70–99)
Potassium: 4.6 meq/L (ref 3.5–5.1)
Sodium: 143 meq/L (ref 135–145)
Total Bilirubin: 0.5 mg/dL (ref 0.2–1.2)
Total Protein: 6.1 g/dL (ref 6.0–8.3)

## 2023-12-02 LAB — TSH: TSH: 5.08 u[IU]/mL (ref 0.35–5.50)

## 2023-12-02 LAB — LIPASE: Lipase: 5 U/L — ABNORMAL LOW (ref 11.0–59.0)

## 2023-12-02 LAB — PROTIME-INR
INR: 1.3 ratio — ABNORMAL HIGH (ref 0.8–1.0)
Prothrombin Time: 13.4 s — ABNORMAL HIGH (ref 9.6–13.1)

## 2023-12-02 NOTE — Patient Instructions (Addendum)
 Your provider has requested that you go to the basement level for lab work before leaving today. Press B on the elevator. The lab is located at the first door on the left as you exit the elevator.  Due to recent changes in healthcare laws, you may see the results of your imaging and laboratory studies on MyChart before your provider has had a chance to review them.  We understand that in some cases there may be results that are confusing or concerning to you. Not all laboratory results come back in the same time frame and the provider may be waiting for multiple results in order to interpret others.  Please give us  48 hours in order for your provider to thoroughly review all the results before contacting the office for clarification of your results.   Please call early July for Dr Refugia Canton open schedule to set up an EGD and colonoscopy with a 2 day prep and holding your iron  7 days. They will make you a free pre-visit to get your instructions.  You have been scheduled for an abdominal ultrasound at [facility] (1st floor of hospital) on 12/09/2023 at 11:00am. Please arrive 15 minutes prior to your appointment for registration. Make certain not to have anything to eat or drink 6 hours prior to your appointment. Should you need to reschedule your appointment, please contact radiology at [phone]. This test typically takes about 30 minutes to perform.   I appreciate the opportunity to care for you. Suzanna Erp, PA

## 2023-12-03 ENCOUNTER — Other Ambulatory Visit (HOSPITAL_BASED_OUTPATIENT_CLINIC_OR_DEPARTMENT_OTHER): Payer: Self-pay

## 2023-12-04 ENCOUNTER — Ambulatory Visit: Payer: Self-pay | Admitting: Gastroenterology

## 2023-12-04 ENCOUNTER — Other Ambulatory Visit: Payer: Self-pay

## 2023-12-08 ENCOUNTER — Telehealth: Payer: Self-pay | Admitting: Family Medicine

## 2023-12-08 NOTE — Telephone Encounter (Signed)
 LMOM. Want to discuss allergen immunotherapy. If she is interested Dr. MARLA said we could move forward with this treatment option. Thank you

## 2023-12-09 ENCOUNTER — Ambulatory Visit (HOSPITAL_COMMUNITY): Admission: RE | Admit: 2023-12-09 | Source: Ambulatory Visit

## 2023-12-10 ENCOUNTER — Telehealth: Payer: Self-pay

## 2023-12-10 NOTE — Telephone Encounter (Signed)
 I left [place] a detailed phone message to call us  back and set up an ECL with Dr Albertus along with a pre-visit. She will be a 2 day prep per [facility] and hold her iron  7 days per [facility].

## 2023-12-12 ENCOUNTER — Other Ambulatory Visit (HOSPITAL_BASED_OUTPATIENT_CLINIC_OR_DEPARTMENT_OTHER): Payer: Self-pay

## 2023-12-14 ENCOUNTER — Other Ambulatory Visit (HOSPITAL_BASED_OUTPATIENT_CLINIC_OR_DEPARTMENT_OTHER): Payer: Self-pay

## 2023-12-14 ENCOUNTER — Other Ambulatory Visit: Payer: Self-pay | Admitting: Internal Medicine

## 2023-12-15 ENCOUNTER — Other Ambulatory Visit: Payer: Self-pay

## 2023-12-15 ENCOUNTER — Other Ambulatory Visit (HOSPITAL_BASED_OUTPATIENT_CLINIC_OR_DEPARTMENT_OTHER): Payer: Self-pay

## 2023-12-15 MED ORDER — PROMETHAZINE HCL 25 MG PO TABS
25.0000 mg | ORAL_TABLET | Freq: Four times a day (QID) | ORAL | 1 refills | Status: DC | PRN
  Filled 2023-12-15 – 2024-01-04 (×4): qty 120, 30d supply, fill #0
  Filled 2024-01-28 – 2024-02-02 (×2): qty 120, 30d supply, fill #1

## 2023-12-16 ENCOUNTER — Other Ambulatory Visit (HOSPITAL_BASED_OUTPATIENT_CLINIC_OR_DEPARTMENT_OTHER): Payer: Self-pay

## 2023-12-17 ENCOUNTER — Other Ambulatory Visit (HOSPITAL_BASED_OUTPATIENT_CLINIC_OR_DEPARTMENT_OTHER): Payer: Self-pay

## 2023-12-17 ENCOUNTER — Inpatient Hospital Stay: Attending: Hematology and Oncology

## 2023-12-17 DIAGNOSIS — D696 Thrombocytopenia, unspecified: Secondary | ICD-10-CM

## 2023-12-17 DIAGNOSIS — D649 Anemia, unspecified: Secondary | ICD-10-CM | POA: Diagnosis present

## 2023-12-17 LAB — CBC WITH DIFFERENTIAL (CANCER CENTER ONLY)
Abs Immature Granulocytes: 0 10*3/uL (ref 0.00–0.07)
Basophils Absolute: 0 10*3/uL (ref 0.0–0.1)
Basophils Relative: 1 %
Eosinophils Absolute: 0.1 10*3/uL (ref 0.0–0.5)
Eosinophils Relative: 6 %
HCT: 35 % — ABNORMAL LOW (ref 36.0–46.0)
Hemoglobin: 11.3 g/dL — ABNORMAL LOW (ref 12.0–15.0)
Immature Granulocytes: 0 %
Lymphocytes Relative: 24 %
Lymphs Abs: 0.5 10*3/uL — ABNORMAL LOW (ref 0.7–4.0)
MCH: 30.4 pg (ref 26.0–34.0)
MCHC: 32.3 g/dL (ref 30.0–36.0)
MCV: 94.1 fL (ref 80.0–100.0)
Monocytes Absolute: 0.1 10*3/uL (ref 0.1–1.0)
Monocytes Relative: 5 %
Neutro Abs: 1.4 10*3/uL — ABNORMAL LOW (ref 1.7–7.7)
Neutrophils Relative %: 64 %
Platelet Count: 59 10*3/uL — ABNORMAL LOW (ref 150–400)
RBC: 3.72 MIL/uL — ABNORMAL LOW (ref 3.87–5.11)
RDW: 13.1 % (ref 11.5–15.5)
WBC Count: 2.1 10*3/uL — ABNORMAL LOW (ref 4.0–10.5)
nRBC: 0 % (ref 0.0–0.2)

## 2023-12-17 LAB — FERRITIN: Ferritin: 14 ng/mL (ref 11–307)

## 2023-12-17 LAB — IRON AND IRON BINDING CAPACITY (CC-WL,HP ONLY)
Iron: 36 ug/dL (ref 28–170)
Saturation Ratios: 8 % — ABNORMAL LOW (ref 10.4–31.8)
TIBC: 433 ug/dL (ref 250–450)
UIBC: 397 ug/dL (ref 148–442)

## 2023-12-20 NOTE — Progress Notes (Signed)
 Addendum: Reviewed and agree with assessment and management plan. Asha Grumbine, Carie Caddy, MD

## 2023-12-21 ENCOUNTER — Other Ambulatory Visit: Payer: Self-pay

## 2023-12-21 ENCOUNTER — Other Ambulatory Visit (HOSPITAL_BASED_OUTPATIENT_CLINIC_OR_DEPARTMENT_OTHER): Payer: Self-pay

## 2023-12-21 NOTE — Assessment & Plan Note (Signed)
 Due to cirrhosis of the liver and splenomegaly   Lab review: 02/04/2018: Platelets 135 07/27/2018: Platelet count 102 01/28/2020: Platelet count 53 02/07/2020: Platelet count 63 05/13/2022: Platelets 87 01/06/2023: Hemoglobin 7, MCV 77.4, platelets 71, WBC 2.6, iron  saturation 2.8%, TIBC 459, ferritin 2.9 01/19/2023: Hemoglobin 7.4, MCV 86, WBC 2.1, platelets 71 (patient received 4 doses of IV iron  so far) 03/02/2023: Hemoglobin 10.4, platelets 68, WBC 2.7, reticulocytes 1.2%, iron  saturation 7%, B12 432, ferritin 8 08/10/23: Hb 13.4, Platelet 70 01/13/24: WBC: 2.1, Hb 11.3, ANC: 1.4, Platelet 59, iron  sat: 8%, Ferritin 14   Previous work-up was negative hepatitis B and C. Hepatitis A showed seropositivity Severe fatigue: We will check for lupus.  Neutropenia: 09/09/2023: ANA negative anti-smooth muscle antibody negative, anti-double-stranded DNA antibody negative: Reticulocyte count: 1.4%, ferritin 12, iron  saturation 13%   Treatment plan: No role of IV iron  at this time. No evidence of lupus to explain the cause of the neutropenia. Prior gastric bypass surgery Recheck labs in 3 months and follow-up after that with a telephone visit

## 2023-12-22 ENCOUNTER — Telehealth: Payer: Self-pay | Admitting: Pharmacy Technician

## 2023-12-22 ENCOUNTER — Telehealth: Payer: Self-pay

## 2023-12-22 ENCOUNTER — Other Ambulatory Visit (HOSPITAL_COMMUNITY): Payer: Self-pay | Admitting: Hematology and Oncology

## 2023-12-22 ENCOUNTER — Inpatient Hospital Stay (HOSPITAL_BASED_OUTPATIENT_CLINIC_OR_DEPARTMENT_OTHER): Admitting: Hematology and Oncology

## 2023-12-22 DIAGNOSIS — D509 Iron deficiency anemia, unspecified: Secondary | ICD-10-CM | POA: Diagnosis not present

## 2023-12-22 DIAGNOSIS — D696 Thrombocytopenia, unspecified: Secondary | ICD-10-CM | POA: Diagnosis not present

## 2023-12-22 DIAGNOSIS — D709 Neutropenia, unspecified: Secondary | ICD-10-CM | POA: Diagnosis not present

## 2023-12-22 NOTE — Telephone Encounter (Signed)
 Dr. Gudena, patient will be scheduled as soon as possible.  Auth Submission: NO AUTH NEEDED Site of care: Site of care: [facility] Payer: [employer] medicaid Medication & CPT/J Code(s) submitted: Monoferric  (Ferrci derisomaltose) [id number] Diagnosis Code:  Route of submission (phone, fax, portal): phone and portal Phone # [phone] Fax # Auth type: Buy/Bill PB Units/visits requested: 1000mg  x 1 dose Reference number: [id number] Approval from: 12/22/23 to 03/23/24

## 2023-12-22 NOTE — Telephone Encounter (Signed)
 Auth Submission: NO AUTH NEEDED Site of care: Site of care: [facility] Payer: [employer] MEDICAID Medication & CPT/J Code(s) submitted: Monoferric  (Ferrci derisomaltose) [phone] Diagnosis Code:  Route of submission (phone, fax, portal):  Phone # Fax # Auth type: Buy/Bill HB Units/visits requested: 1000MG  X1 DOSE Reference number:  Approval from: 12/22/23 to 04/23/24

## 2023-12-22 NOTE — Progress Notes (Signed)
 HEMATOLOGY-ONCOLOGY TELEPHONE VISIT PROGRESS NOTE  I connected with our patient on 12/22/23 at 11:15 AM EDT by telephone and verified that I am speaking with the correct person using two identifiers.  I discussed the limitations, risks, security and privacy concerns of performing an evaluation and management service by telephone and the availability of in person appointments.  I also discussed with the patient that there may be a patient responsible charge related to this service. The patient expressed understanding and agreed to proceed.   History of Present Illness:   History of Present Illness Linda Becker is a 33 year old female who presents with fatigue and hypoglycemia.  She experiences significant fatigue and sluggishness, with chronically low blood sugar levels averaging in the fifties, occasionally dropping to the forties and thirties. These episodes occur suddenly. She checks her blood sugar regularly due to insurance not covering a continuous glucose monitor for hypoglycemia.  Her hemoglobin levels have been a concern, with the lowest recorded at seven and the most recent value at eleven point three. She has iron  deficiency, with a value of two point nine a year ago and a recent value of fourteen. Despite this, she continues to experience fatigue and has started craving ice.  She has undergone gastric bypass surgery, which has impacted her B12 levels. Her last B12 level, checked three months ago, was two ninety eight.  REVIEW OF SYSTEMS:   Constitutional: Denies fevers, chills or abnormal weight loss All other systems were reviewed with the patient and are negative. Observations/Objective:     Assessment Plan:  Thrombocytopenia (HCC) Due to cirrhosis of the liver and splenomegaly   IV Iron : Monoferric  Aug 2024, Sept 2024  Lab review: 02/04/2018: Platelets 135 07/27/2018: Platelet count 102 01/28/2020: Platelet count 53 02/07/2020: Platelet count 63 05/13/2022: Platelets  87 01/06/2023: Hemoglobin 7, MCV 77.4, platelets 71, WBC 2.6, iron  saturation 2.8%, TIBC 459, ferritin 2.9 01/19/2023: Hemoglobin 7.4, MCV 86, WBC 2.1, platelets 71 (patient received 4 doses of IV iron  so far) 03/02/2023: Hemoglobin 10.4, platelets 68, WBC 2.7, reticulocytes 1.2%, iron  saturation 7%, B12 432, ferritin 8 08/10/23: Hb 13.4, Platelet 70 01/13/24: WBC: 2.1, Hb 11.3, ANC: 1.4, Platelet 59, iron  sat: 8%, Ferritin 14   Previous work-up was negative hepatitis B and C. Hepatitis A showed seropositivity Severe fatigue: Blood work negative for lupus  Neutropenia: 09/09/2023: ANA negative anti-smooth muscle antibody negative, anti-double-stranded DNA antibody negative: Reticulocyte count: 1.4%, ferritin 12, iron  saturation 13%   Treatment plan: Because of cravings for ice chips I recommended that she get IV iron  therapy. Will set this up at the [facility].  No evidence of lupus to explain the cause of the neutropenia. Prior gastric bypass surgery Recheck labs in 4 months and follow-up after that with a telephone visit  Assessment & Plan Severe Fatigue Persistent severe fatigue post-gastric bypass. Iron  deficiency unlikely given stable hemoglobin and iron  levels. - Administer iron  infusion at [facility] within a week. - Advise sublingual B12 tablets, 5000 mcg, OTC.  Chronic Anemia Chronic anemia with improved hemoglobin at 11.3. Iron  infusion planned to address symptoms like pica. - Administer iron  infusion at [facility] within a week. - Recheck blood work in four months.  Hypoglycemia Hypoglycemia with glucose levels 30s-50s, not diabetes-related. - Continue regular blood glucose monitoring.      I discussed the assessment and treatment plan with the patient. The patient was provided an opportunity to ask questions and all were answered. The patient agreed with  the plan and demonstrated an understanding of the instructions. The patient was advised to call  back or seek an in-person evaluation if the symptoms worsen or if the condition fails to improve as anticipated.   I provided 20 minutes of non-face-to-face time during this encounter.  This includes time for charting and coordination of care   Naomi MARLA Chad, MD

## 2023-12-23 ENCOUNTER — Other Ambulatory Visit: Payer: Self-pay | Admitting: Family Medicine

## 2023-12-23 ENCOUNTER — Other Ambulatory Visit (HOSPITAL_BASED_OUTPATIENT_CLINIC_OR_DEPARTMENT_OTHER): Payer: Self-pay

## 2023-12-24 ENCOUNTER — Other Ambulatory Visit (HOSPITAL_BASED_OUTPATIENT_CLINIC_OR_DEPARTMENT_OTHER): Payer: Self-pay

## 2023-12-24 ENCOUNTER — Telehealth: Payer: Self-pay | Admitting: Gastroenterology

## 2023-12-24 DIAGNOSIS — K2100 Gastro-esophageal reflux disease with esophagitis, without bleeding: Secondary | ICD-10-CM

## 2023-12-24 DIAGNOSIS — Z860100 Personal history of colon polyps, unspecified: Secondary | ICD-10-CM

## 2023-12-24 NOTE — Telephone Encounter (Signed)
 Left message for pt to call back

## 2023-12-24 NOTE — Telephone Encounter (Signed)
 Per previous orders: Dulera  200-2 puffs twice a day with a spacer and Spiriva  1.25 mcg 2 puffs once a day to replace Trelegy and Breztri  since neither of these inhalers are covered.

## 2023-12-24 NOTE — Telephone Encounter (Signed)
 Inbound call from patient requesting a call to discuss endoscopy and colonoscopy further that was previous discussed in 6/18 office visit.  Please advise, thank you

## 2023-12-25 ENCOUNTER — Other Ambulatory Visit (HOSPITAL_BASED_OUTPATIENT_CLINIC_OR_DEPARTMENT_OTHER): Payer: Self-pay

## 2023-12-25 ENCOUNTER — Other Ambulatory Visit: Payer: Self-pay | Admitting: Family Medicine

## 2023-12-25 ENCOUNTER — Encounter (HOSPITAL_BASED_OUTPATIENT_CLINIC_OR_DEPARTMENT_OTHER): Payer: Self-pay

## 2023-12-25 MED ORDER — ALBUTEROL SULFATE HFA 108 (90 BASE) MCG/ACT IN AERS
2.0000 | INHALATION_SPRAY | RESPIRATORY_TRACT | 1 refills | Status: DC | PRN
Start: 2023-12-25 — End: 2024-03-09
  Filled 2023-12-25: qty 18, 17d supply, fill #0
  Filled 2024-01-28 – 2024-02-09 (×2): qty 18, 17d supply, fill #1

## 2023-12-25 NOTE — Telephone Encounter (Signed)
 Patient is calling back stating that she would like to speak to the nurse in regards to scheduling for a Endo colon at the hospital. Patient is requesting a call back. Please advise.

## 2023-12-25 NOTE — Telephone Encounter (Signed)
 See additional phone note.

## 2023-12-28 NOTE — Telephone Encounter (Signed)
 Spoke with pt and let her know there are no hospital slots available and we are waiting for the October schedule to come out. Pt knows we will contact her to schedule the appt once Oct schedule Is released.

## 2023-12-29 ENCOUNTER — Other Ambulatory Visit (HOSPITAL_BASED_OUTPATIENT_CLINIC_OR_DEPARTMENT_OTHER): Payer: Self-pay

## 2023-12-29 ENCOUNTER — Other Ambulatory Visit (HOSPITAL_COMMUNITY): Payer: Self-pay

## 2023-12-29 ENCOUNTER — Other Ambulatory Visit: Payer: Self-pay

## 2023-12-29 MED ORDER — NA SULFATE-K SULFATE-MG SULF 17.5-3.13-1.6 GM/177ML PO SOLN
ORAL | 0 refills | Status: DC
  Filled 2023-12-29 – 2024-02-09 (×2): qty 354, 1d supply, fill #0

## 2023-12-29 NOTE — Telephone Encounter (Signed)
 Dr Albertus-  Do you know of any reason patient would require hospital endo/colon vs procedures in [facility]? Patient is apparently under the impression that she needs hospital procedures but I am not finding anything to indicate she requires hospital procedure.SABRASABRA

## 2023-12-29 NOTE — Telephone Encounter (Signed)
 I have spoken to patient to advise that she may have endoscopy and colonoscopy completed in our [facility] outpatient setting this time since she is unlikely to need APC therapy and BMI is now less than 50. Patient verbalizes understanding. Patient scheduled for endoscopy/colonoscopy on 02/25/24 at 130 pm. She has been advised of time/date/location for upcoming procedure and has been given generalized verbal prep instructions. Discussed that a care partner 18 years or older should bring her, stay for the procedure and drive home due to sedation. Written instructions for two day endoscopy/colonoscopy prep have been made available to the patient for additional review via mychart.

## 2023-12-29 NOTE — Addendum Note (Signed)
 Addended by: CLAUDENE NAOMIE SAILOR on: 12/29/2023 04:37 PM   Modules accepted: Orders

## 2023-12-29 NOTE — Telephone Encounter (Signed)
 It was previously due to weight and then at 1 point the need for APC However weight is now much lower and unlikely to need APC I have included Norleen Schillings, CRNA to ensure he does not see any airway issues that might preclude [facility]

## 2023-12-31 ENCOUNTER — Other Ambulatory Visit: Payer: Self-pay

## 2023-12-31 ENCOUNTER — Inpatient Hospital Stay (HOSPITAL_COMMUNITY)
Admission: RE | Admit: 2023-12-31 | Discharge: 2023-12-31 | Disposition: A | Discharge status: Home / Self Care | Source: Ambulatory Visit | Attending: Hematology and Oncology

## 2023-12-31 ENCOUNTER — Other Ambulatory Visit (HOSPITAL_BASED_OUTPATIENT_CLINIC_OR_DEPARTMENT_OTHER): Payer: Self-pay

## 2023-12-31 DIAGNOSIS — D509 Iron deficiency anemia, unspecified: Secondary | ICD-10-CM | POA: Diagnosis present

## 2023-12-31 MED ORDER — SODIUM CHLORIDE 0.9 % IV SOLN
1000.0000 mg | Freq: Once | INTRAVENOUS | Status: AC
  Administered 2023-12-31: 1000 mg via INTRAVENOUS
  Filled 2023-12-31: qty 10

## 2024-01-02 ENCOUNTER — Telehealth: Admitting: Nurse Practitioner

## 2024-01-02 ENCOUNTER — Other Ambulatory Visit (HOSPITAL_BASED_OUTPATIENT_CLINIC_OR_DEPARTMENT_OTHER): Payer: Self-pay

## 2024-01-02 DIAGNOSIS — B379 Candidiasis, unspecified: Secondary | ICD-10-CM | POA: Diagnosis not present

## 2024-01-02 DIAGNOSIS — T3695XA Adverse effect of unspecified systemic antibiotic, initial encounter: Secondary | ICD-10-CM

## 2024-01-02 DIAGNOSIS — R399 Unspecified symptoms and signs involving the genitourinary system: Secondary | ICD-10-CM

## 2024-01-02 MED ORDER — SULFAMETHOXAZOLE-TRIMETHOPRIM 800-160 MG PO TABS
1.0000 | ORAL_TABLET | Freq: Two times a day (BID) | ORAL | 0 refills | Status: AC
  Filled 2024-01-02: qty 6, 3d supply, fill #0

## 2024-01-02 MED ORDER — FLUCONAZOLE 150 MG PO TABS
150.0000 mg | ORAL_TABLET | ORAL | 0 refills | Status: DC | PRN
  Filled 2024-01-02: qty 3, 9d supply, fill #0

## 2024-01-02 NOTE — Progress Notes (Signed)
 I have spent 5 minutes in review of e-visit questionnaire, review and updating patient chart, medical decision making and response to patient.   Claiborne Rigg, NP

## 2024-01-02 NOTE — Progress Notes (Signed)
 E-Visit for Urinary Problems  We are sorry that you are not feeling well.  Here is how we plan to help!  Based on what you shared with me it looks like you most likely have a simple urinary tract infection.  A UTI (Urinary Tract Infection) is a bacterial infection of the bladder.  Most cases of urinary tract infections are simple to treat but a key part of your care is to encourage you to drink plenty of fluids and watch your symptoms carefully.  I have prescribed Bactrim DS One tablet twice a day for 5 days.  Your symptoms should gradually improve. Call us if the burning in your urine worsens, you develop worsening fever, back pain or pelvic pain or if your symptoms do not resolve after completing the antibiotic.  Urinary tract infections can be prevented by drinking plenty of water to keep your body hydrated.  Also be sure when you wipe, wipe from front to back and don't hold it in!  If possible, empty your bladder every 4 hours.  HOME CARE Drink plenty of fluids Compete the full course of the antibiotics even if the symptoms resolve Remember, when you need to go.go. Holding in your urine can increase the likelihood of getting a UTI! GET HELP RIGHT AWAY IF: You cannot urinate You get a high fever Worsening back pain occurs You see blood in your urine You feel sick to your stomach or throw up You feel like you are going to pass out  MAKE SURE YOU  Understand these instructions. Will watch your condition. Will get help right away if you are not doing well or get worse.   Thank you for choosing an e-visit.  Your e-visit answers were reviewed by a board certified advanced clinical practitioner to complete your personal care plan. Depending upon the condition, your plan could have included both over the counter or prescription medications.  Please review your pharmacy choice. Make sure the pharmacy is open so you can pick up prescription now. If there is a problem, you may contact  your provider through [employer] and have the prescription routed to another pharmacy.  Your safety is important to [place]. If you have drug allergies check your prescription carefully.   For the next 24 hours you can use MyChart to ask questions about today's visit, request a non-urgent call back, or ask for a work or school excuse. You will get an email in the next two days asking about your experience. I hope that your e-visit has been valuable and will speed your recovery.

## 2024-01-04 ENCOUNTER — Other Ambulatory Visit (HOSPITAL_BASED_OUTPATIENT_CLINIC_OR_DEPARTMENT_OTHER): Payer: Self-pay

## 2024-01-05 ENCOUNTER — Other Ambulatory Visit (HOSPITAL_BASED_OUTPATIENT_CLINIC_OR_DEPARTMENT_OTHER): Payer: Self-pay

## 2024-01-08 ENCOUNTER — Other Ambulatory Visit (HOSPITAL_COMMUNITY): Payer: Self-pay

## 2024-01-09 ENCOUNTER — Other Ambulatory Visit (HOSPITAL_BASED_OUTPATIENT_CLINIC_OR_DEPARTMENT_OTHER): Payer: Self-pay

## 2024-01-10 NOTE — Patient Instructions (Incomplete)
  1.  Treat and prevent inflammation of airway:  A. Trelegy 200 - 1 inhalation 1 time per day  2.  Treat and prevent reflux/LPR/recurrent emesis:   A.  Continue Voquezna , Pepcid , Phenergan   3.  If needed:  A. Albuterol  - 2 inhalations or nebulization every 4-6 hours  B. Cetirizine  10 mg - 1 tab 1-2 times per day C. Desonide  ointment to face -1-2 times per day D. Mometasone  0.1% ointment to body 1-2 times per day E. For asthma flare, begin budesonide  0.5 mg twice a day via nebulizer for 1-2 weeks or until cough and wheeze free, then stop  5. Follow up in 2 months or sooner if needed

## 2024-01-10 NOTE — Progress Notes (Deleted)
   [address] [place] [address] Dept: [phone]  FOLLOW UP NOTE  Patient ID: Linda Becker, female    DOB: 03/05/1991  Age: 33 y.o. MRN: [id number] Date of Office Visit: 01/11/2024  Assessment  Chief Complaint: No chief complaint on file.  HPI Linda Becker is a 33 year old female who presents to the clinic for a follow up visit. She was last seen in this clinic on 11/12/2023 by Arlean Mutter, FNP, for evaluation of asthma, allergic rhinitis, atopic dermatitis, stinging insect allergy , and reflux. Her last environmental allergy  skin testing was on 09/30/2020 was positive to weed pollen, tree pollen, dog, and dust mite. Discussed the use of AI scribe software for clinical note transcription with the patient, who gave verbal consent to proceed.  History of Present Illness      Drug Allergies:  Allergies  Allergen Reactions   Progesterone Rash and Other (See Comments)    Was in a form of birth control   Penicillins Rash and Other (See Comments)    Did it involve swelling of the face/tongue/throat, SOB, or low BP? No Did it involve sudden or severe rash/hives, skin peeling, or any reaction on the inside of your mouth or nose? Yes Did you need to seek medical attention at a hospital or doctor's office? No When did it last happen?      9 + months If all above answers are NO, may proceed with cephalosporin use.      Physical Exam: There were no vitals taken for this visit.   Physical Exam  Diagnostics:    Assessment and Plan: No diagnosis found.  No orders of the defined types were placed in this encounter.   There are no Patient Instructions on file for this visit.  No follow-ups on file.    Thank you for the opportunity to care for this patient.  Please do not hesitate to contact me with questions.  Arlean Mutter, FNP [facility]

## 2024-01-11 ENCOUNTER — Other Ambulatory Visit (HOSPITAL_COMMUNITY): Payer: Self-pay | Admitting: Physician Assistant

## 2024-01-11 ENCOUNTER — Ambulatory Visit: Admitting: Family Medicine

## 2024-01-11 DIAGNOSIS — F3181 Bipolar II disorder: Secondary | ICD-10-CM

## 2024-01-11 DIAGNOSIS — F411 Generalized anxiety disorder: Secondary | ICD-10-CM

## 2024-01-11 DIAGNOSIS — F5105 Insomnia due to other mental disorder: Secondary | ICD-10-CM

## 2024-01-12 ENCOUNTER — Other Ambulatory Visit (HOSPITAL_BASED_OUTPATIENT_CLINIC_OR_DEPARTMENT_OTHER): Payer: Self-pay

## 2024-01-12 ENCOUNTER — Other Ambulatory Visit: Payer: Self-pay

## 2024-01-15 ENCOUNTER — Other Ambulatory Visit (HOSPITAL_BASED_OUTPATIENT_CLINIC_OR_DEPARTMENT_OTHER): Payer: Self-pay

## 2024-01-18 ENCOUNTER — Encounter: Payer: Self-pay | Admitting: Endocrinology

## 2024-01-18 ENCOUNTER — Ambulatory Visit (INDEPENDENT_AMBULATORY_CARE_PROVIDER_SITE_OTHER): Admitting: Endocrinology

## 2024-01-18 VITALS — BP 100/70 | HR 95 | Resp 20 | Ht 66.0 in | Wt 211.6 lb

## 2024-01-18 DIAGNOSIS — E162 Hypoglycemia, unspecified: Secondary | ICD-10-CM

## 2024-01-18 DIAGNOSIS — R110 Nausea: Secondary | ICD-10-CM | POA: Diagnosis not present

## 2024-01-18 MED ORDER — FREESTYLE LIBRE 3 PLUS SENSOR MISC: 0 refills | Status: AC

## 2024-01-18 NOTE — Patient Instructions (Signed)
 Labs today.   Keep food log for 1 week prior to follow up with me in 1 month.  Use Libre 3 + sensor.

## 2024-01-18 NOTE — Progress Notes (Signed)
 Outpatient Endocrinology Note [place] Luna Audia, MD   Patient's Name: Linda Becker    DOB: December 03, 1990    MRN: [id number]  REASON OF VISIT: Hypoglycemia   PCP: Onita Rush, MD  HISTORY OF PRESENT ILLNESS:   Linda Becker is a 33 y.o. old female with past medical history as listed below is presented for evaluation of hypoglycemia.  Pertinent History: Patient was previously and last time seen by Dr. Von in February 2023.  Patient was seen for chronic nausea and abnormal ACTH  stimulation test in the context of prior high-dose systemic glucocorticoid intake.  She had hypothyroidism/abnormal thyroid  function test, had taken thyroid  hormone replacement in the form of liothyronine in the past.  Patient presented today for the evaluation of hypoglycemia.  Patient reports she has noticed occasional hypoglycemia sometimes blood sugar up to 30-40 range no glucometer data available to review usually in the afternoon after meals.  She has not paid attention to the actual meal causing hypoglycemia.  Hypoglycemia started from December 2024.  She had to use continuous glucose monitor freestyle libre 3 for about 2 months prior to April 2025.  She had episode of hypoglycemia alarms while sleeping during the night however no hypoglycemic symptoms.  She has had hypoglycemic symptoms in the afternoon with confusion and sweating.  Other symptoms include lightheadedness and presyncopal episodes.  She takes glucose tablets and eats meal to correct hypoglycemia.  She is trying to avoid hypoglycemia with frequent eating.  In the last 2 weeks he had blood sugar once around 11 AM which was 88.  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  She complains of nausea which is chronic and has been there for several years.  Lately she has been slowly gaining weight.  Appetite is fair, no loss of appetite.  She has normal thyroid  function test, TSH was 5.08 in June 2025.  She has not been on  thyroid  medication.  Other: She has history of cirrhosis secondary to NASH, biopsy proven in 2019 which was complicated by portal hypertension with esophageal varices bleeding.  She was diagnosed at the time of pregnancy complicated by intrahepatic cholestasis of pregnancy.  She has history of esophageal varices.  She had been on carvedilol , beta-blocker to manage.  She has been following with [facility].  She reports being considered for liver transplant.   Interval history Presented for evaluation with concern of hypoglycemia.  REVIEW OF SYSTEMS:  As per history of present illness.   PAST MEDICAL HISTORY: Past Medical History:  Diagnosis Date   Allergic rhinitis    remote hx of allergy  shots   Asthma    since childhood  on controller meds extrinsic dr tiana   Bipolar II disorder (HCC)    Cirrhosis (HCC)    Erosive esophagitis    Esophageal varices (HCC)    Family history of adverse reaction to anesthesia    mom and sister difficult to awaken per patient    Gallbladder sludge    Gastropathy    GERD (gastroesophageal reflux disease)    on nexium   for  long term sx since childhood   H/O miscarriage, not currently pregnant    [redacted] weeks  march 2016   Hepatic steatosis    Hiatal hernia    Hypothyroidism    Migraine    Murmur    pt reports MVP   Portal hypertension (HCC)    Sessile colonic polyp    Splenomegaly    Steatohepatitis  Syncope    under eval ? cause   Tachycardia    episodes with near syncope eval dr Margaretann  on no meds dced LABA   Thrombocytopenia (HCC)    Upper GI bleed     PAST SURGICAL HISTORY: Past Surgical History:  Procedure Laterality Date   BIOPSY  09/03/2018   Procedure: BIOPSY;  Surgeon: Albertus Gordy HERO, MD;  Location: [facility];  Service: Gastroenterology;;   BIOPSY  09/26/2020   Procedure: BIOPSY;  Surgeon: Albertus Gordy HERO, MD;  Location: [facility];  Service: Gastroenterology;;   BIOPSY  05/12/2022   Procedure: BIOPSY;  Surgeon: Albertus Gordy HERO,  MD;  Location: [facility];  Service: Gastroenterology;;   broken right femur  2008   rod placed   COLONOSCOPY     COLONOSCOPY WITH ESOPHAGOGASTRODUODENOSCOPY (EGD)  03/24/2023   [facility]us [facility]   COLONOSCOPY WITH PROPOFOL  N/A 09/03/2018   Procedure: COLONOSCOPY WITH PROPOFOL ;  Surgeon: Albertus Gordy HERO, MD;  Location: [facility];  Service: Gastroenterology;  Laterality: N/A;   DILATION AND CURETTAGE OF UTERUS     ESOPHAGOGASTRODUODENOSCOPY (EGD) WITH PROPOFOL  N/A 09/03/2018   Procedure: ESOPHAGOGASTRODUODENOSCOPY (EGD) WITH PROPOFOL ;  Surgeon: Albertus Gordy HERO, MD;  Location: [facility];  Service: Gastroenterology;  Laterality: N/A;   ESOPHAGOGASTRODUODENOSCOPY (EGD) WITH PROPOFOL  N/A 01/27/2020   Procedure: ESOPHAGOGASTRODUODENOSCOPY (EGD) WITH PROPOFOL ;  Surgeon: Wilhelmenia Aloha Raddle., MD;  Location: [facility];  Service: Gastroenterology;  Laterality: N/A;   ESOPHAGOGASTRODUODENOSCOPY (EGD) WITH PROPOFOL  N/A 03/12/2020   Procedure: ESOPHAGOGASTRODUODENOSCOPY (EGD) WITH PROPOFOL ;  Surgeon: Albertus Gordy HERO, MD;  Location: [facility];  Service: Gastroenterology;  Laterality: N/A;   ESOPHAGOGASTRODUODENOSCOPY (EGD) WITH PROPOFOL  N/A 09/26/2020   Procedure: ESOPHAGOGASTRODUODENOSCOPY (EGD) WITH PROPOFOL ;  Surgeon: Albertus Gordy HERO, MD;  Location: [facility];  Service: Gastroenterology;  Laterality: N/A;   ESOPHAGOGASTRODUODENOSCOPY (EGD) WITH PROPOFOL  N/A 04/29/2021   Procedure: ESOPHAGOGASTRODUODENOSCOPY (EGD) WITH PROPOFOL ;  Surgeon: Albertus Gordy HERO, MD;  [facility]: [facility];  Service: Gastroenterology;  Laterality: N/A;   ESOPHAGOGASTRODUODENOSCOPY (EGD) WITH PROPOFOL  N/A 05/12/2022   Procedure: ESOPHAGOGASTRODUODENOSCOPY (EGD) WITH PROPOFOL ;  Surgeon: Albertus Gordy HERO, MD;  Location: [facility];  Service: Gastroenterology;  Laterality: N/A;   GASTRIC VARICES BANDING  01/27/2020   Procedure: GASTRIC VARICES BANDING;  Surgeon: Wilhelmenia Aloha Raddle., MD;  Location: [facility];  Service:  Gastroenterology;;   HOT HEMOSTASIS N/A 04/29/2021   Procedure: HOT HEMOSTASIS (ARGON PLASMA COAGULATION/BICAP);  Surgeon: Albertus Gordy HERO, MD;  Location: [facility];  Service: Gastroenterology;  Laterality: N/A;   IR TRANSCATHETER BX  01/19/2019   IR US  GUIDE VASC ACCESS RIGHT  01/19/2019   IR VENOGRAM HEPATIC W HEMODYNAMIC EVALUATION  01/19/2019   LAPAROSCOPIC GASTRIC SLEEVE RESECTION N/A 06/28/2019   Procedure: LAPAROSCOPIC GASTRIC SLEEVE RESECTION, Upper Endo, ERAS Pathway;  Surgeon: Signe Mitzie LABOR, MD;  Location: [facility];  Service: General;  Laterality: N/A;   OB ultrasound N/A 12/01/2017   see report   POLYPECTOMY  09/03/2018   Procedure: POLYPECTOMY;  Surgeon: Albertus Gordy HERO, MD;  Location: [facility];  Service: Gastroenterology;;   TONSILLECTOMY  2006   UPPER GASTROINTESTINAL ENDOSCOPY     WISDOM TOOTH EXTRACTION      ALLERGIES: Allergies  Allergen Reactions   Progesterone Rash and Other (See Comments)    Was in a form of birth control   Penicillins Rash and Other (See Comments)    Did it involve swelling of the face/tongue/throat, SOB, or low BP? No Did it involve sudden or severe rash/hives, skin peeling,  or any reaction on the inside of your mouth or nose? Yes Did you need to seek medical attention at a hospital or doctor's office? No When did it last happen?      9 + months If all above answers are NO, may proceed with cephalosporin use.      FAMILY HISTORY:  Family History  Problem Relation Age of Onset   Liver disease Mother 82       advanced non-alcoholic cirrhosis   Arthritis Mother    Hyperlipidemia Mother    Heart disease Mother    Hypertension Mother    Diabetes Mother    Kidney disease Mother    Bleeding Disorder Mother    Colon polyps Mother    Asthma Father    Arthritis Father    Hyperlipidemia Father    Heart disease Father    Hypertension Father    Diabetes Father    Colon polyps Father    Breast cancer Maternal Aunt    Breast cancer  Paternal Aunt    Esophageal cancer Paternal Aunt    Arthritis Maternal Grandmother    Breast cancer Maternal Grandmother    Thyroid  disease Maternal Grandmother    Diabetes Maternal Grandfather    Pancreatic cancer Maternal Grandfather    Breast cancer Paternal Grandmother    Diabetes Paternal Grandmother    Colon cancer Paternal Grandfather    Diabetes Paternal Grandfather    Liver cancer Paternal Grandfather    Stomach cancer Paternal Grandfather     SOCIAL HISTORY: Social History   Socioeconomic History   Marital status: Married    Spouse name: Not on file   Number of children: 1   Years of education: Not on file   Highest education level: Not on file  Occupational History   Not on file  Tobacco Use   Smoking status: Never   Smokeless tobacco: Never  Vaping Use   Vaping status: Never Used  Substance and Sexual Activity   Alcohol use: No    Alcohol/week: 0.0 standard drinks of alcohol   Drug use: No   Sexual activity: Yes    Birth control/protection: None  Other Topics Concern   Not on file  Social History Narrative   6-7 hours of sleep per night   Works full time   Lives with her parents sis and  Infant nephew   [employer]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   Divorced   8 week preg loss 09-25-2023     Son passed away Mar 04, 2018   Social Drivers of Health   Financial Resource Strain: High Risk (05/26/2023)   Overall Financial Resource Strain (CARDIA)    Difficulty of Paying Living Expenses: Very hard  Food Insecurity: Food Insecurity Present (02/24/2023)   Hunger Vital Sign    Worried About Running Out of Food in the Last Year: Often true    Ran Out of Food in the Last Year: Often true  Transportation Needs: No Transportation Needs (02/24/2023)   PRAPARE - Administrator, Civil Service (Medical): No    Lack of Transportation (Non-Medical): No  Physical Activity: Inactive (05/26/2023)   Exercise Vital Sign    Days of Exercise per Week:  0 days    Minutes of Exercise per Session: 0 min  Stress: Stress Concern Present (05/26/2023)   [employer] of Occupational Health - Occupational Stress Questionnaire    Feeling of Stress : Very much  Social Connections:  Socially Isolated (05/26/2023)   Social Connection and Isolation Panel    Frequency of Communication with Friends and Family: More than three times a week    Frequency of Social Gatherings with Friends and Family: Twice a week    Attends Religious Services: Never    Database administrator or Organizations: No    Attends Engineer, structural: Never    Marital Status: Separated    MEDICATIONS:  Current Outpatient Medications  Medication Sig Dispense Refill   albuterol  (PROVENTIL ) (2.5 MG/3ML) 0.083% nebulizer solution Take 3 mLs (2.5 mg total) by nebulization every 4 (four) hours as needed for wheezing or shortness of breath (coughing fits). 150 mL 1   albuterol  (VENTOLIN  HFA) 108 (90 Base) MCG/ACT inhaler Inhale 2 puffs into the lungs every 4 (four) to 6 (six) hours as needed for cough/wheeze. 18 g 1   Ascorbic Acid  (VITAMIN C ) 1000 MG tablet Take 2,000 mg by mouth 2 (two) times daily.     Blood Glucose Monitoring Suppl (ACCU-CHEK GUIDE) w/Device KIT Use as directed to test blood sugar twice daily. 1 kit 0   budesonide  (PULMICORT ) 0.5 MG/2ML nebulizer solution For asthma flare, begin budesonide  1 vial (0.5 mg) twice a day for 1 - 2 weeks or until cough and wheeze free then stop 120 mL 1   CALCIUM  PO Take 1 tablet by mouth 2 (two) times daily.     cariprazine  (VRAYLAR ) 3 MG capsule Take 1 capsule (3 mg total) by mouth daily. 30 capsule 1   carvedilol  (COREG ) 12.5 MG tablet Take 1 tablet (12.5 mg total) by mouth 2 (two) times daily with a meal. 60 tablet 2   cetirizine  (ZYRTEC ) 10 MG tablet Take 1 tablet (10 mg total) by mouth 2 (two) times daily. 180 tablet 1   desonide  (DESOWEN ) 0.05 % ointment Apply 1 Application topically 2 (two) times daily. Apply to the  face 180 g 1   diphenhydrAMINE  (BENADRYL ) 25 MG tablet Take 50 mg by mouth at bedtime as needed for allergies or sleep.     escitalopram  (LEXAPRO ) 10 MG tablet Take 1 tablet (10 mg total) by mouth daily. 30 tablet 1   famotidine  (PEPCID ) 40 MG tablet Take 1 tablet (40 mg total) by mouth 2 (two) times daily. Pt need an office visit for further refills. 180 tablet 1   fluconazole  (DIFLUCAN ) 150 MG tablet Take 1 tablet (150 mg total) by mouth every three (3) days as needed. 3 tablet 0   gabapentin  (NEURONTIN ) 400 MG capsule Take 1 capsule (400 mg total) by mouth 3 (three) times daily. 90 capsule 1   glucose blood (ONETOUCH VERIO) test strip Use to test blood sugar twice daily as directed. 200 strip 3   Lancets (ONETOUCH DELICA PLUS LANCET33G) MISC Check blood sugar 2 times a day 200 each 3   mometasone  (ELOCON ) 0.1 % ointment Apply topically 2 (two) times daily. Apply to the body 135 g 1   mometasone -formoterol  (DULERA ) 200-5 MCG/ACT AERO 2 puffs twice a day with a spacer 13 g 5   Na Sulfate-K Sulfate-Mg Sulf 17.5-3.13-1.6 GM/177ML SOLN Take as Directed 354 mL 0   Na Sulfate-K Sulfate-Mg Sulfate concentrate (SUPREP) 17.5-3.13-1.6 GM/177ML SOLN Use as directed; may use generic; goodrx card if insurance will not cover generic 354 mL 0   naphazoline-pheniramine (EYE ALLERGY  RELIEF) 0.025-0.3 % ophthalmic solution Place 2 drops into both eyes 4 (four) times daily as needed for allergies.     Prenatal Vit-Fe Fumarate-FA (MULTIVITAMIN-PRENATAL) 27-0.8  MG TABS tablet Take 1 tablet by mouth 2 (two) times daily.     promethazine  (PHENERGAN ) 25 MG tablet Take 1 tablet (25 mg total) by mouth every 6 (six) hours as needed. 120 tablet 1   Respiratory Therapy Supplies (NEBULIZER MASK ADULT) MISC Use as directed 1 each 1   Tiotropium Bromide  Monohydrate (SPIRIVA  RESPIMAT) 1.25 MCG/ACT AERS 2 puffs once a day 4 g 5   traZODone  (DESYREL ) 50 MG tablet Take 1 tablet (50 mg total) by mouth at bedtime. 30 tablet 1    ursodiol  (ACTIGALL ) 500 MG tablet Take 1 tablet (500 mg total) by mouth 3 (three) times daily 90 tablet 11   Vonoprazan Fumarate  (VOQUEZNA ) 20 MG TABS Take 1 tablet by mouth daily. 30 tablet 1   XIFAXAN  550 MG TABS tablet Take 1 tablet (550 mg total) by mouth 2 (two) times daily. 180 tablet 3   Continuous Glucose Sensor (FREESTYLE LIBRE 3 PLUS SENSOR) MISC Apply one sensor every 15 days. (Patient not taking: Reported on 01/18/2024) 6 each 3   No current facility-administered medications for this visit.   Facility-Administered Medications Ordered in Other Visits  Medication Dose Route Frequency Provider Last Rate Last Admin   0.9 %  sodium chloride  infusion   Intravenous Once PRN Pyrtle, Gordy HERO, MD       albuterol  (VENTOLIN  HFA) 108 (90 Base) MCG/ACT inhaler 2 puff  2 puff Inhalation Once PRN Pyrtle, Gordy HERO, MD       diphenhydrAMINE  (BENADRYL ) injection 50 mg  50 mg Intravenous Once PRN Pyrtle, Gordy HERO, MD       EPINEPHrine  (EPI-PEN) injection 0.3 mg  0.3 mg Intramuscular Once PRN Pyrtle, Gordy HERO, MD       famotidine  (PEPCID ) IVPB 20 mg premix  20 mg Intravenous Once PRN Pyrtle, Gordy HERO, MD       iron  sucrose (VENOFER ) 200 mg in sodium chloride  0.9 % 100 mL IVPB  200 mg Intravenous Once Pyrtle, Gordy HERO, MD       methylPREDNISolone  sodium succinate (SOLU-MEDROL ) 125 mg/2 mL injection 125 mg  125 mg Intravenous Once PRN Pyrtle, Gordy HERO, MD       promethazine  (PHENERGAN ) suppository 12.5-25 mg  12.5-25 mg Rectal Q4H PRN Signe Mitzie LABOR, MD        PHYSICAL EXAM: Vitals:   01/18/24 0924  BP: 100/70  Pulse: 95  Resp: 20  SpO2: 98%  Weight: 211 lb 9.6 oz (96 kg)  Height: 5' 6 (1.676 m)   Body mass index is 34.15 kg/m.  Wt Readings from Last 3 Encounters:  01/18/24 211 lb 9.6 oz (96 kg)  12/31/23 180 lb (81.6 kg)  12/02/23 204 lb (92.5 kg)    General: Well developed, well nourished female in no apparent distress. Appropriate for age.  HEENT: AT/[place], no external lesions. Hearing intact to the spoken  word Eyes: EOMI. Conjunctiva clear and no icterus. Neck: Trachea midline, neck supple without appreciable thyromegaly or lymphadenopathy and no palpable thyroid  nodules Lungs: Clear to auscultation, no wheeze. Respirations not labored Heart: S1S2, Regular in rate and rhythm.  Abdomen: Soft, non tender Neurologic: Alert, oriented, normal speech, deep tendon biceps reflexes normal,  no gross focal neurological deficit Extremities: No pedal pitting edema, no tremors of outstretched hands Skin: Warm, color good.  Psychiatric: Does not appear depressed or anxious  PERTINENT HISTORIC LABORATORY AND IMAGING STUDIES:  All pertinent laboratory results were reviewed. Please see HPI also for further details.    ASSESSMENT / PLAN  1. Hypoglycemia   2. Nausea    Patient presented for evaluation of concern of hypoglycemia since December 2024.  No glucose data available to review.  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  Unclear etiology of hypoglycemia this time however it is potentially possible that hypoglycemia is related to post gastric bypass surgery, possibly reactive hypoglycemia.  Patient reports hypoglycemia is in the afternoon after meals.  Contributing factor for hypoglycemia could be related to liver cirrhosis/liver disease, ? potentially use of beta-blocker.  Plan: - I would like to get glucose data, provided freestyle libre 3+ CGM sensors 2 samples in the clinic today. - Will review glucose data when available in the follow-up visit. - Patient is asked to check blood sugar with glucometer as well when symptomatic with hypoglycemia or point CGM is reading low blood sugar. - Patient is asked to keep food log at least morning prior to follow-up visit and bring in the follow-up visit. - I would like to check lab hemoglobin A1c, ACTH , cortisol, C-peptide, random glucose and insulin  antibodies today. - In 2023 patient had abnormal ACTH  stimulation test however she  had used systemic steroid prior to the test.  She has no obvious features of adrenal insufficiency, no loss of appetite, no weight loss.  Due to concern of hypoglycemia , will evaluate for cortisol as well.  If the cortisol is concerning we will plan for ACTH  stimulation test. - Discussed that hypoglycemia related to gastric bypass surgery/reactive hypoglycemia can be improved with the use of protein rich diet/mixed meal diet.  In this case we will refer to dietitian and for  diet plan.  Diagnoses and all orders for this visit:  Hypoglycemia -     Hemoglobin A1c -     ACTH  -     Cortisol -     C-peptide -     Glucose, random -     Insulin  antibodies, blood  Nausea -     ACTH  -     Cortisol    DISPOSITION Follow up in clinic in 1 month suggested.  All questions answered and patient verbalized understanding of the plan.  [place] Chazlyn Cude, MD [facility] Endocr[facility] [address] [place], [address] [place], [place] [address] Phone # [phone]  At least part of this note was generated using voice recognition software. Inadvertent word errors may have occurred, which were not recognized during the proofreading process.

## 2024-01-19 ENCOUNTER — Encounter (HOSPITAL_COMMUNITY): Payer: Self-pay | Admitting: *Deleted

## 2024-01-20 ENCOUNTER — Other Ambulatory Visit (HOSPITAL_BASED_OUTPATIENT_CLINIC_OR_DEPARTMENT_OTHER): Payer: Self-pay

## 2024-01-20 ENCOUNTER — Other Ambulatory Visit: Payer: Self-pay

## 2024-01-20 ENCOUNTER — Encounter: Payer: Self-pay | Admitting: Hematology and Oncology

## 2024-01-20 MED ORDER — ACCU-CHEK GUIDE W/DEVICE KIT
PACK | 0 refills | Status: AC
  Filled 2024-01-20 – 2024-02-25 (×2): qty 1, 30d supply, fill #0
  Filled 2024-03-21: qty 1, 1d supply, fill #0
  Filled 2024-05-24: qty 1, 30d supply, fill #0
  Filled 2024-05-25: qty 1, 90d supply, fill #0

## 2024-01-22 ENCOUNTER — Ambulatory Visit: Payer: Self-pay | Admitting: Endocrinology

## 2024-01-23 ENCOUNTER — Other Ambulatory Visit (HOSPITAL_BASED_OUTPATIENT_CLINIC_OR_DEPARTMENT_OTHER): Payer: Self-pay

## 2024-01-23 LAB — HEMOGLOBIN A1C
Hgb A1c MFr Bld: 5.1 % (ref <5.7)
Mean Plasma Glucose: 100 mg/dL
eAG (mmol/L): 5.5 mmol/L

## 2024-01-23 LAB — INSULIN ANTIBODIES, BLOOD: Insulin Antibodies, Human: <0.4 U/mL (ref <0.4)

## 2024-01-23 LAB — ACTH: C206 ACTH: 16 pg/mL (ref 6–50)

## 2024-01-23 LAB — CORTISOL: Cortisol, Plasma: 15.2 ug/dL

## 2024-01-23 LAB — C-PEPTIDE: C-Peptide: 2.7 ng/mL (ref 0.80–3.85)

## 2024-01-23 LAB — GLUCOSE, RANDOM: Glucose, Plasma: 92 mg/dL (ref 65–139)

## 2024-01-28 ENCOUNTER — Other Ambulatory Visit (HOSPITAL_BASED_OUTPATIENT_CLINIC_OR_DEPARTMENT_OTHER): Payer: Self-pay

## 2024-01-28 ENCOUNTER — Other Ambulatory Visit: Payer: Self-pay | Admitting: Internal Medicine

## 2024-01-28 ENCOUNTER — Other Ambulatory Visit: Payer: Self-pay

## 2024-01-28 ENCOUNTER — Other Ambulatory Visit (HOSPITAL_COMMUNITY): Payer: Self-pay | Admitting: Physician Assistant

## 2024-01-28 DIAGNOSIS — F411 Generalized anxiety disorder: Secondary | ICD-10-CM

## 2024-01-28 DIAGNOSIS — F5105 Insomnia due to other mental disorder: Secondary | ICD-10-CM

## 2024-01-28 DIAGNOSIS — F3181 Bipolar II disorder: Secondary | ICD-10-CM

## 2024-01-28 MED ORDER — VOQUEZNA 20 MG PO TABS
1.0000 | ORAL_TABLET | Freq: Every day | ORAL | 1 refills | Status: DC
  Filled 2024-01-28: qty 30, 30d supply, fill #0

## 2024-01-29 ENCOUNTER — Other Ambulatory Visit: Payer: Self-pay

## 2024-01-29 ENCOUNTER — Encounter: Payer: Self-pay | Admitting: Endocrinology

## 2024-01-29 MED ORDER — FREESTYLE LIBRE 3 PLUS SENSOR MISC: 1.0000 | 0 refills | Status: AC

## 2024-01-29 NOTE — Telephone Encounter (Signed)
 Patient picked up patient assistance - Log Noted Libre sensors x2 on 01/29/24

## 2024-01-30 ENCOUNTER — Other Ambulatory Visit (HOSPITAL_BASED_OUTPATIENT_CLINIC_OR_DEPARTMENT_OTHER): Payer: Self-pay

## 2024-02-01 ENCOUNTER — Other Ambulatory Visit (HOSPITAL_BASED_OUTPATIENT_CLINIC_OR_DEPARTMENT_OTHER): Payer: Self-pay

## 2024-02-02 ENCOUNTER — Other Ambulatory Visit (HOSPITAL_BASED_OUTPATIENT_CLINIC_OR_DEPARTMENT_OTHER): Payer: Self-pay

## 2024-02-03 ENCOUNTER — Other Ambulatory Visit (HOSPITAL_BASED_OUTPATIENT_CLINIC_OR_DEPARTMENT_OTHER): Payer: Self-pay

## 2024-02-09 ENCOUNTER — Other Ambulatory Visit (HOSPITAL_BASED_OUTPATIENT_CLINIC_OR_DEPARTMENT_OTHER): Payer: Self-pay

## 2024-02-12 ENCOUNTER — Other Ambulatory Visit (HOSPITAL_BASED_OUTPATIENT_CLINIC_OR_DEPARTMENT_OTHER): Payer: Self-pay

## 2024-02-13 ENCOUNTER — Other Ambulatory Visit (HOSPITAL_BASED_OUTPATIENT_CLINIC_OR_DEPARTMENT_OTHER): Payer: Self-pay

## 2024-02-17 ENCOUNTER — Ambulatory Visit: Admitting: Endocrinology

## 2024-02-22 ENCOUNTER — Other Ambulatory Visit: Payer: Self-pay | Admitting: Internal Medicine

## 2024-02-22 ENCOUNTER — Other Ambulatory Visit (HOSPITAL_BASED_OUTPATIENT_CLINIC_OR_DEPARTMENT_OTHER): Payer: Self-pay

## 2024-02-22 MED ORDER — PROMETHAZINE HCL 25 MG PO TABS
25.0000 mg | ORAL_TABLET | Freq: Four times a day (QID) | ORAL | 1 refills | Status: DC | PRN
  Filled 2024-02-22 – 2024-03-03 (×4): qty 120, 30d supply, fill #0

## 2024-02-25 ENCOUNTER — Encounter: Payer: Self-pay | Admitting: Hematology and Oncology

## 2024-02-25 ENCOUNTER — Other Ambulatory Visit (HOSPITAL_BASED_OUTPATIENT_CLINIC_OR_DEPARTMENT_OTHER): Payer: Self-pay

## 2024-02-25 ENCOUNTER — Ambulatory Visit: Admitting: Internal Medicine

## 2024-02-25 ENCOUNTER — Encounter: Payer: Self-pay | Admitting: Internal Medicine

## 2024-02-25 VITALS — BP 111/93 | HR 81 | Temp 98.0°F | Resp 20 | Ht 66.0 in | Wt 204.0 lb

## 2024-02-25 DIAGNOSIS — Z1211 Encounter for screening for malignant neoplasm of colon: Secondary | ICD-10-CM

## 2024-02-25 DIAGNOSIS — R1013 Epigastric pain: Secondary | ICD-10-CM | POA: Diagnosis not present

## 2024-02-25 DIAGNOSIS — K7469 Other cirrhosis of liver: Secondary | ICD-10-CM | POA: Diagnosis not present

## 2024-02-25 DIAGNOSIS — Z860101 Personal history of adenomatous and serrated colon polyps: Secondary | ICD-10-CM | POA: Diagnosis not present

## 2024-02-25 DIAGNOSIS — K449 Diaphragmatic hernia without obstruction or gangrene: Secondary | ICD-10-CM | POA: Diagnosis not present

## 2024-02-25 DIAGNOSIS — K3189 Other diseases of stomach and duodenum: Secondary | ICD-10-CM

## 2024-02-25 DIAGNOSIS — D125 Benign neoplasm of sigmoid colon: Secondary | ICD-10-CM

## 2024-02-25 DIAGNOSIS — Z903 Acquired absence of stomach [part of]: Secondary | ICD-10-CM

## 2024-02-25 DIAGNOSIS — I8500 Esophageal varices without bleeding: Secondary | ICD-10-CM

## 2024-02-25 DIAGNOSIS — Z860100 Personal history of colon polyps, unspecified: Secondary | ICD-10-CM

## 2024-02-25 MED ORDER — PANTOPRAZOLE SODIUM 40 MG PO TBEC
40.0000 mg | DELAYED_RELEASE_TABLET | Freq: Two times a day (BID) | ORAL | 3 refills | Status: AC
  Filled 2024-02-25 – 2024-05-24 (×6): qty 180, 90d supply, fill #0

## 2024-02-25 MED ORDER — SODIUM CHLORIDE 0.9 % IV SOLN: 500.0000 mL | Freq: Once | INTRAVENOUS | Status: DC

## 2024-02-25 NOTE — Progress Notes (Signed)
 Called to room to assist during endoscopic procedure.  Patient ID and intended procedure confirmed with present staff. Received instructions for my participation in the procedure from the performing physician.

## 2024-02-25 NOTE — Progress Notes (Signed)
 To pacu, VSS. Report to [email or web address]

## 2024-02-25 NOTE — Progress Notes (Signed)
 GASTROENTEROLOGY PROCEDURE H&P NOTE   Primary Care Physician: Onita Rush, MD    Reason for Procedure:   Hx of IDA, epigastric pain, MASH cirrhosis, hx of polyps  Plan:    EGD/colon  Patient is appropriate for endoscopic procedure(s) in the ambulatory ([facility]) setting.  The nature of the procedure, as well as the risks, benefits, and alternatives were carefully and thoroughly reviewed with the patient. Ample time for discussion and questions allowed. The patient understood, was satisfied, and agreed to proceed.     HPI: Linda Becker is a 33 y.o. female who presents for EGD/colon.  Medical history as below.  Tolerated the prep.  No recent chest pain or shortness of breath.  No abdominal pain today.  Past Medical History:  Diagnosis Date   Allergic rhinitis    remote hx of allergy  shots   Asthma    since childhood  on controller meds extrinsic dr tiana   Bipolar II disorder (HCC)    Cirrhosis (HCC)    Erosive esophagitis    Esophageal varices (HCC)    Family history of adverse reaction to anesthesia    mom and sister difficult to awaken per patient    Gallbladder sludge    Gastropathy    GERD (gastroesophageal reflux disease)    on nexium   for  long term sx since childhood   H/O miscarriage, not currently pregnant    [redacted] weeks  march 2016   Hepatic steatosis    Hiatal hernia    Hypothyroidism    Migraine    Murmur    pt reports MVP   Portal hypertension (HCC)    Sessile colonic polyp    Splenomegaly    Steatohepatitis    Syncope    under eval ? cause   Tachycardia    episodes with near syncope eval dr Margaretann  on no meds dced LABA   Thrombocytopenia (HCC)    Upper GI bleed     Past Surgical History:  Procedure Laterality Date   BIOPSY  09/03/2018   Procedure: BIOPSY;  Surgeon: Albertus Gordy HERO, MD;  Location: [facility];  Service: Gastroenterology;;   BIOPSY  09/26/2020   Procedure: BIOPSY;  Surgeon: Albertus Gordy HERO, MD;  Location: [facility];  Service:  Gastroenterology;;   BIOPSY  05/12/2022   Procedure: BIOPSY;  Surgeon: Albertus Gordy HERO, MD;  Location: [facility];  Service: Gastroenterology;;   broken right femur  2008   rod placed   COLONOSCOPY     COLONOSCOPY WITH ESOPHAGOGASTRODUODENOSCOPY (EGD)  03/24/2023   Gordy Albertus at [facility]   COLONOSCOPY WITH PROPOFOL  N/A 09/03/2018   Procedure: COLONOSCOPY WITH PROPOFOL ;  Surgeon: Albertus Gordy HERO, MD;  Location: [facility];  Service: Gastroenterology;  Laterality: N/A;   DILATION AND CURETTAGE OF UTERUS     ESOPHAGOGASTRODUODENOSCOPY (EGD) WITH PROPOFOL  N/A 09/03/2018   Procedure: ESOPHAGOGASTRODUODENOSCOPY (EGD) WITH PROPOFOL ;  Surgeon: Albertus Gordy HERO, MD;  Location: [facility];  Service: Gastroenterology;  Laterality: N/A;   ESOPHAGOGASTRODUODENOSCOPY (EGD) WITH PROPOFOL  N/A 01/27/2020   Procedure: ESOPHAGOGASTRODUODENOSCOPY (EGD) WITH PROPOFOL ;  Surgeon: Wilhelmenia Aloha Raddle., MD;  Location: [facility];  Service: Gastroenterology;  Laterality: N/A;   ESOPHAGOGASTRODUODENOSCOPY (EGD) WITH PROPOFOL  N/A 03/12/2020   Procedure: ESOPHAGOGASTRODUODENOSCOPY (EGD) WITH PROPOFOL ;  Surgeon: Albertus Gordy HERO, MD;  Location: [facility];  Service: Gastroenterology;  Laterality: N/A;   ESOPHAGOGASTRODUODENOSCOPY (EGD) WITH PROPOFOL  N/A 09/26/2020   Procedure: ESOPHAGOGASTRODUODENOSCOPY (EGD) WITH PROPOFOL ;  Surgeon: Albertus Gordy HERO, MD;  Location: [facility];  Service:  Gastroenterology;  Laterality: N/A;   ESOPHAGOGASTRODUODENOSCOPY (EGD) WITH PROPOFOL  N/A 04/29/2021   Procedure: ESOPHAGOGASTRODUODENOSCOPY (EGD) WITH PROPOFOL ;  Surgeon: Albertus Gordy HERO, MD;  Location: [facility];  Service: Gastroenterology;  Laterality: N/A;   ESOPHAGOGASTRODUODENOSCOPY (EGD) WITH PROPOFOL  N/A 05/12/2022   Procedure: ESOPHAGOGASTRODUODENOSCOPY (EGD) WITH PROPOFOL ;  Surgeon: Albertus Gordy HERO, MD;  Location: [facility];  Service: Gastroenterology;  Laterality: N/A;   GASTRIC VARICES BANDING  01/27/2020   Procedure: GASTRIC  VARICES BANDING;  Surgeon: Wilhelmenia Aloha Raddle., MD;  Location: [facility];  Service: Gastroenterology;;   HOT HEMOSTASIS N/A 04/29/2021   Procedure: HOT HEMOSTASIS (ARGON PLASMA COAGULATION/BICAP);  Surgeon: Albertus Gordy HERO, MD;  Location: [facility];  Service: Gastroenterology;  Laterality: N/A;   IR TRANSCATHETER BX  01/19/2019   IR US  GUIDE VASC ACCESS RIGHT  01/19/2019   IR VENOGRAM HEPATIC W HEMODYNAMIC EVALUATION  01/19/2019   LAPAROSCOPIC GASTRIC SLEEVE RESECTION N/A 06/28/2019   Procedure: LAPAROSCOPIC GASTRIC SLEEVE RESECTION, Upper Endo, ERAS Pathway;  Surgeon: Signe Mitzie LABOR, MD;  Location: [facility];  Service: General;  Laterality: N/A;   OB ultrasound N/A 12/01/2017   see report   POLYPECTOMY  09/03/2018   Procedure: POLYPECTOMY;  Surgeon: Albertus Gordy HERO, MD;  Location: [facility];  Service: Gastroenterology;;   TONSILLECTOMY  2006   UPPER GASTROINTESTINAL ENDOSCOPY     WISDOM TOOTH EXTRACTION      Prior to Admission medications   Medication Sig Start Date End Date Taking? Authorizing Provider  albuterol  (PROVENTIL ) (2.5 MG/3ML) 0.083% nebulizer solution Take 3 mLs (2.5 mg total) by nebulization every 4 (four) hours as needed for wheezing or shortness of breath (coughing fits). 06/30/23  Yes Kozlow, Camellia PARAS, MD  albuterol  (VENTOLIN  HFA) 108 (90 Base) MCG/ACT inhaler Inhale 2 puffs into the lungs every 4 (four) to 6 (six) hours as needed for cough/wheeze. 12/25/23  Yes Ambs, Arlean HERO, FNP  Ascorbic Acid  (VITAMIN C ) 1000 MG tablet Take 2,000 mg by mouth 2 (two) times daily.   Yes [provider]  Blood Glucose Monitoring Suppl (ACCU-CHEK GUIDE) w/Device KIT Use as directed to test blood sugar twice daily. 01/20/24  Yes   CALCIUM  PO Take 1 tablet by mouth 2 (two) times daily.   Yes [provider]  carvedilol  (COREG ) 12.5 MG tablet Take 1 tablet (12.5 mg total) by mouth 2 (two) times daily with a meal. 04/13/23  Yes Agueda Houpt, Gordy HERO, MD  cetirizine  (ZYRTEC ) 10 MG  tablet Take 1 tablet (10 mg total) by mouth 2 (two) times daily. 06/30/23  Yes Kozlow, Camellia PARAS, MD  Continuous Glucose Sensor (FREESTYLE LIBRE 3 PLUS SENSOR) MISC Change every 15 days 01/18/24  Yes Thapa, [place], MD  Continuous Glucose Sensor (FREESTYLE LIBRE 3 PLUS SENSOR) MISC Apply one sensor every 15 days. 01/29/24  Yes Thapa, [place], MD  famotidine  (PEPCID ) 40 MG tablet Take 1 tablet (40 mg total) by mouth 2 (two) times daily. Pt need an office visit for further refills. 06/30/23  Yes Kozlow, Camellia PARAS, MD  glucose blood (ONETOUCH VERIO) test strip Use to test blood sugar twice daily as directed. 10/22/23  Yes   Lancets (ONETOUCH DELICA PLUS LANCET33G) MISC Check blood sugar 2 times a day 10/22/23  Yes   naphazoline-pheniramine (EYE ALLERGY  RELIEF) 0.025-0.3 % ophthalmic solution Place 2 drops into both eyes 4 (four) times daily as needed for allergies.   Yes [provider]  promethazine  (PHENERGAN ) 25 MG tablet Take 1 tablet (25 mg total) by mouth every 6 (six)  hours as needed. 02/22/24  Yes Audry Kauzlarich, Gordy HERO, MD  Vonoprazan Fumarate  (VOQUEZNA ) 20 MG TABS Take 1 tablet by mouth daily. 01/28/24  Yes Quadarius Henton, Gordy HERO, MD  XIFAXAN  550 MG TABS tablet Take 1 tablet (550 mg total) by mouth 2 (two) times daily. 04/07/22  Yes   budesonide  (PULMICORT ) 0.5 MG/2ML nebulizer solution For asthma flare, begin budesonide  1 vial (0.5 mg) twice a day for 1 - 2 weeks or until cough and wheeze free then stop 10/09/23   Ambs, Arlean HERO, FNP  cariprazine  (VRAYLAR ) 3 MG capsule Take 1 capsule (3 mg total) by mouth daily. Patient not taking: Reported on 02/25/2024 11/10/23   Nwoko, Uchenna E, PA  desonide  (DESOWEN ) 0.05 % ointment Apply 1 Application topically 2 (two) times daily. Apply to the face 06/30/23   Maurilio Camellia PARAS, MD  diphenhydrAMINE  (BENADRYL ) 25 MG tablet Take 50 mg by mouth at bedtime as needed for allergies or sleep.    [provider]  escitalopram  (LEXAPRO ) 10 MG tablet Take 1 tablet (10 mg total) by mouth  daily. Patient not taking: Reported on 02/25/2024 11/10/23   Nwoko, Uchenna E, PA  fluconazole  (DIFLUCAN ) 150 MG tablet Take 1 tablet (150 mg total) by mouth every three (3) days as needed. Patient not taking: Reported on 02/25/2024 01/02/24   Fleming, Zelda W, NP  gabapentin  (NEURONTIN ) 400 MG capsule Take 1 capsule (400 mg total) by mouth 3 (three) times daily. Patient not taking: Reported on 02/25/2024 11/10/23   Nwoko, Uchenna E, PA  mometasone  (ELOCON ) 0.1 % ointment Apply topically 2 (two) times daily. Apply to the body Patient not taking: Reported on 02/25/2024 06/30/23   Kozlow, Eric J, MD  mometasone -formoterol  (DULERA ) 200-5 MCG/ACT AERO 2 puffs twice a day with a spacer Patient not taking: Reported on 02/25/2024 11/13/23   Cari Arlean HERO, FNP  Prenatal Vit-Fe Fumarate-FA (MULTIVITAMIN-PRENATAL) 27-0.8 MG TABS tablet Take 1 tablet by mouth 2 (two) times daily.    [provider]  Respiratory Therapy Supplies (NEBULIZER MASK ADULT) MISC Use as directed 06/30/23   Kozlow, Camellia PARAS, MD  Tiotropium Bromide  Monohydrate (SPIRIVA  RESPIMAT) 1.25 MCG/ACT AERS 2 puffs once a day 11/13/23   Ambs, Arlean HERO, FNP  traZODone  (DESYREL ) 50 MG tablet Take 1 tablet (50 mg total) by mouth at bedtime. Patient not taking: Reported on 02/25/2024 11/10/23   Nwoko, Uchenna E, PA  ursodiol  (ACTIGALL ) 500 MG tablet Take 1 tablet (500 mg total) by mouth 3 (three) times daily Patient not taking: Reported on 02/25/2024 08/10/23       Current Outpatient Medications  Medication Sig Dispense Refill   albuterol  (PROVENTIL ) (2.5 MG/3ML) 0.083% nebulizer solution Take 3 mLs (2.5 mg total) by nebulization every 4 (four) hours as needed for wheezing or shortness of breath (coughing fits). 150 mL 1   albuterol  (VENTOLIN  HFA) 108 (90 Base) MCG/ACT inhaler Inhale 2 puffs into the lungs every 4 (four) to 6 (six) hours as needed for cough/wheeze. 18 g 1   Ascorbic Acid  (VITAMIN C ) 1000 MG tablet Take 2,000 mg by mouth 2 (two) times daily.      Blood Glucose Monitoring Suppl (ACCU-CHEK GUIDE) w/Device KIT Use as directed to test blood sugar twice daily. 1 kit 0   CALCIUM  PO Take 1 tablet by mouth 2 (two) times daily.     carvedilol  (COREG ) 12.5 MG tablet Take 1 tablet (12.5 mg total) by mouth 2 (two) times daily with a meal. 60 tablet 2   cetirizine  (ZYRTEC )  10 MG tablet Take 1 tablet (10 mg total) by mouth 2 (two) times daily. 180 tablet 1   Continuous Glucose Sensor (FREESTYLE LIBRE 3 PLUS SENSOR) MISC Change every 15 days 2 each 0   Continuous Glucose Sensor (FREESTYLE LIBRE 3 PLUS SENSOR) MISC Apply one sensor every 15 days. 2 each 0   famotidine  (PEPCID ) 40 MG tablet Take 1 tablet (40 mg total) by mouth 2 (two) times daily. Pt need an office visit for further refills. 180 tablet 1   glucose blood (ONETOUCH VERIO) test strip Use to test blood sugar twice daily as directed. 200 strip 3   Lancets (ONETOUCH DELICA PLUS LANCET33G) MISC Check blood sugar 2 times a day 200 each 3   naphazoline-pheniramine (EYE ALLERGY  RELIEF) 0.025-0.3 % ophthalmic solution Place 2 drops into both eyes 4 (four) times daily as needed for allergies.     promethazine  (PHENERGAN ) 25 MG tablet Take 1 tablet (25 mg total) by mouth every 6 (six) hours as needed. 120 tablet 1   Vonoprazan Fumarate  (VOQUEZNA ) 20 MG TABS Take 1 tablet by mouth daily. 30 tablet 1   XIFAXAN  550 MG TABS tablet Take 1 tablet (550 mg total) by mouth 2 (two) times daily. 180 tablet 3   budesonide  (PULMICORT ) 0.5 MG/2ML nebulizer solution For asthma flare, begin budesonide  1 vial (0.5 mg) twice a day for 1 - 2 weeks or until cough and wheeze free then stop 120 mL 1   cariprazine  (VRAYLAR ) 3 MG capsule Take 1 capsule (3 mg total) by mouth daily. (Patient not taking: Reported on 02/25/2024) 30 capsule 1   desonide  (DESOWEN ) 0.05 % ointment Apply 1 Application topically 2 (two) times daily. Apply to the face 180 g 1   diphenhydrAMINE  (BENADRYL ) 25 MG tablet Take 50 mg by mouth at bedtime as  needed for allergies or sleep.     escitalopram  (LEXAPRO ) 10 MG tablet Take 1 tablet (10 mg total) by mouth daily. (Patient not taking: Reported on 02/25/2024) 30 tablet 1   fluconazole  (DIFLUCAN ) 150 MG tablet Take 1 tablet (150 mg total) by mouth every three (3) days as needed. (Patient not taking: Reported on 02/25/2024) 3 tablet 0   gabapentin  (NEURONTIN ) 400 MG capsule Take 1 capsule (400 mg total) by mouth 3 (three) times daily. (Patient not taking: Reported on 02/25/2024) 90 capsule 1   mometasone  (ELOCON ) 0.1 % ointment Apply topically 2 (two) times daily. Apply to the body (Patient not taking: Reported on 02/25/2024) 135 g 1   mometasone -formoterol  (DULERA ) 200-5 MCG/ACT AERO 2 puffs twice a day with a spacer (Patient not taking: Reported on 02/25/2024) 13 g 5   Prenatal Vit-Fe Fumarate-FA (MULTIVITAMIN-PRENATAL) 27-0.8 MG TABS tablet Take 1 tablet by mouth 2 (two) times daily.     Respiratory Therapy Supplies (NEBULIZER MASK ADULT) MISC Use as directed 1 each 1   Tiotropium Bromide  Monohydrate (SPIRIVA  RESPIMAT) 1.25 MCG/ACT AERS 2 puffs once a day 4 g 5   traZODone  (DESYREL ) 50 MG tablet Take 1 tablet (50 mg total) by mouth at bedtime. (Patient not taking: Reported on 02/25/2024) 30 tablet 1   ursodiol  (ACTIGALL ) 500 MG tablet Take 1 tablet (500 mg total) by mouth 3 (three) times daily (Patient not taking: Reported on 02/25/2024) 90 tablet 11   Current Facility-Administered Medications  Medication Dose Route Frequency Provider Last Rate Last Admin   0.9 %  sodium chloride  infusion  500 mL Intravenous Once Eladia Frame, Gordy HERO, MD       Facility-Administered Medications  Ordered in Other Visits  Medication Dose Route Frequency Provider Last Rate Last Admin   0.9 %  sodium chloride  infusion   Intravenous Once PRN Remedios Mckone, Gordy HERO, MD       albuterol  (VENTOLIN  HFA) 108 (90 Base) MCG/ACT inhaler 2 puff  2 puff Inhalation Once PRN Rashidi Loh, Gordy HERO, MD       diphenhydrAMINE  (BENADRYL ) injection 50 mg  50 mg  Intravenous Once PRN Mael Delap, Gordy HERO, MD       EPINEPHrine  (EPI-PEN) injection 0.3 mg  0.3 mg Intramuscular Once PRN Sharrell Krawiec, Gordy HERO, MD       famotidine  (PEPCID ) IVPB 20 mg premix  20 mg Intravenous Once PRN Samyra Limb, Gordy HERO, MD       iron  sucrose (VENOFER ) 200 mg in sodium chloride  0.9 % 100 mL IVPB  200 mg Intravenous Once Merritt Kibby, Gordy HERO, MD       methylPREDNISolone  sodium succinate (SOLU-MEDROL ) 125 mg/2 mL injection 125 mg  125 mg Intravenous Once PRN Yailyn Strack, Gordy HERO, MD       promethazine  (PHENERGAN ) suppository 12.5-25 mg  12.5-25 mg Rectal Q4H PRN Signe Mitzie LABOR, MD        Allergies as of 02/25/2024 - Review Complete 02/25/2024  Allergen Reaction Noted   Progesterone Itching, Rash, and Other (See Comments) 05/19/2017   Penicillins Rash and Other (See Comments) 02/10/2018    Family History  Problem Relation Age of Onset   Liver disease Mother 56       advanced non-alcoholic cirrhosis   Arthritis Mother    Hyperlipidemia Mother    Heart disease Mother    Hypertension Mother    Diabetes Mother    Kidney disease Mother    Bleeding Disorder Mother    Colon polyps Mother    Asthma Father    Arthritis Father    Hyperlipidemia Father    Heart disease Father    Hypertension Father    Diabetes Father    Colon polyps Father    Breast cancer Maternal Aunt    Breast cancer Paternal Aunt    Esophageal cancer Paternal Aunt    Arthritis Maternal Grandmother    Breast cancer Maternal Grandmother    Thyroid  disease Maternal Grandmother    Diabetes Maternal Grandfather    Pancreatic cancer Maternal Grandfather    Breast cancer Paternal Grandmother    Diabetes Paternal Grandmother    Colon cancer Paternal Grandfather    Diabetes Paternal Grandfather    Liver cancer Paternal Grandfather    Stomach cancer Paternal Grandfather     Social History   Socioeconomic History   Marital status: Married    Spouse name: Not on file   Number of children: 1   Years of education: Not on file    Highest education level: Not on file  Occupational History   Not on file  Tobacco Use   Smoking status: Never   Smokeless tobacco: Never  Vaping Use   Vaping status: Never Used  Substance and Sexual Activity   Alcohol use: No    Alcohol/week: 0.0 standard drinks of alcohol   Drug use: No   Sexual activity: Yes    Birth control/protection: None  Other Topics Concern   Not on file  Social History Narrative   6-7 hours of sleep per night   Works full time   Lives with her parents sis and  Infant nephew   [employer]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  Divorced   8 week preg loss march 16      Son passed away 03/09/2018   Social Drivers of Health   Financial Resource Strain: High Risk (05/26/2023)   Overall Financial Resource Strain (CARDIA)    Difficulty of Paying Living Expenses: Very hard  Food Insecurity: Food Insecurity Present (02/24/2023)   Hunger Vital Sign    Worried About Running Out of Food in the Last Year: Often true    Ran Out of Food in the Last Year: Often true  Transportation Needs: No Transportation Needs (02/24/2023)   PRAPARE - Administrator, Civil Service (Medical): No    Lack of Transportation (Non-Medical): No  Physical Activity: Inactive (05/26/2023)   Exercise Vital Sign    Days of Exercise per Week: 0 days    Minutes of Exercise per Session: 0 min  Stress: Stress Concern Present (05/26/2023)   [employer] of Occupational Health - Occupational Stress Questionnaire    Feeling of Stress : Very much  Social Connections: Socially Isolated (05/26/2023)   Social Connection and Isolation Panel    Frequency of Communication with Friends and Family: More than three times a week    Frequency of Social Gatherings with Friends and Family: Twice a week    Attends Religious Services: Never    Database administrator or Organizations: No    Attends Banker Meetings: Never    Marital Status: Separated  Intimate Partner  Violence: Not At Risk (02/24/2023)   Humiliation, Afraid, Rape, and Kick questionnaire    Fear of Current or Ex-Partner: No    Emotionally Abused: No    Physically Abused: No    Sexually Abused: No    Physical Exam: Vital signs in last 24 hours: @BP  126/68   Pulse 94   Temp 98 F (36.7 C) (Temporal)   Ht 5' 6 (1.676 m)   Wt 204 lb (92.5 kg)   SpO2 95%   BMI 32.93 kg/m  GEN: NAD EYE: Sclerae anicteric ENT: MMM CV: Non-tachycardic Pulm: CTA b/l GI: Soft, NT/ND NEURO:  Alert & Oriented x 3   Gordy Starch, MD [facility]  02/25/2024 2:24 PM

## 2024-02-25 NOTE — Op Note (Signed)
 [facility] Patient Name: Linda Becker Procedure Date: 02/25/2024 2:15 PM MRN: [id number] Endoscopist: Gordy CHRISTELLA Starch , MD, [id number] Age: 33 Referring MD:  Date of Birth: 10-05-1990 Gender: Female Account #: [id number] Procedure:                Colonoscopy Indications:              High risk colon cancer surveillance: Personal                            history of sessile serrated colon polyp (less than                            10 mm in size) with no dysplasia, Last colonoscopy:                            October 2024 (poor prep), March 2020 (SSP x 2),                            also IDA Medicines:                Monitored Anesthesia Care Procedure:                Pre-Anesthesia Assessment:                           - Prior to the procedure, a History and Physical                            was performed, and patient medications and                            allergies were reviewed. The patient's tolerance of                            previous anesthesia was also reviewed. The risks                            and benefits of the procedure and the sedation                            options and risks were discussed with the patient.                            All questions were answered, and informed consent                            was obtained. Prior Anticoagulants: The patient has                            taken no anticoagulant or antiplatelet agents. ASA                            Grade Assessment: III - A patient with severe  systemic disease. After reviewing the risks and                            benefits, the patient was deemed in satisfactory                            condition to undergo the procedure.                           After obtaining informed consent, the colonoscope                            was passed under direct vision. Throughout the                            procedure, the patient's blood pressure, pulse, and                             oxygen  saturations were monitored continuously. The                            CF [id number] #[id number] was introduced through the anus                            and advanced to the terminal ileum. The colonoscopy                            was performed without difficulty. The patient                            tolerated the procedure well. The quality of the                            bowel preparation was good (2 day prep). The                            terminal ileum, ileocecal valve, appendiceal                            orifice, and rectum were photographed. Scope In: 2:50:00 PM Scope Out: 3:07:55 PM Scope Withdrawal Time: 0 hours 12 minutes 44 seconds  Total Procedure Duration: 0 hours 17 minutes 55 seconds  Findings:                 The digital rectal exam was normal.                           The terminal ileum appeared normal.                           A 4 mm polyp was found in the sigmoid colon. The                            polyp was sessile. The polyp was removed with a  cold snare. Resection and retrieval were complete.                           The exam was otherwise without abnormality on                            direct and retroflexion views. Complications:            No immediate complications. Estimated Blood Loss:     Estimated blood loss: none. Impression:               - The examined portion of the ileum was normal.                           - One 4 mm polyp in the sigmoid colon, removed with                            a cold snare. Resected and retrieved.                           - The examination was otherwise normal on direct                            and retroflexion views. Recommendation:           - Patient has a contact number available for                            emergencies. The signs and symptoms of potential                            delayed complications were discussed with the                             patient. Return to normal activities tomorrow.                            Written discharge instructions were provided to the                            patient.                           - Resume previous diet.                           - Continue present medications.                           - Await pathology results.                           - See EGD report.                           - Repeat colonoscopy is recommended for  surveillance. The colonoscopy date will be                            determined after pathology results from today's                            exam become available for review. Gordy CHRISTELLA Starch, MD 02/25/2024 3:15:46 PM This report has been signed electronically.

## 2024-02-25 NOTE — Op Note (Signed)
 [facility] Patient Name: Linda Becker Procedure Date: 02/25/2024 2:38 PM MRN: [id number] Endoscopist: Gordy CHRISTELLA Starch , MD, [id number] Age: 33 Referring MD:  Date of Birth: 13-Dec-1990 Gender: Female Account #: [id number] Procedure:                Upper GI endoscopy Indications:              Epigastric abdominal pain, Iron  deficiency anemia,                            Heartburn, Gastro-esophageal reflux disease Medicines:                Monitored Anesthesia Care Procedure:                Pre-Anesthesia Assessment:                           - Prior to the procedure, a History and Physical                            was performed, and patient medications and                            allergies were reviewed. The patient's tolerance of                            previous anesthesia was also reviewed. The risks                            and benefits of the procedure and the sedation                            options and risks were discussed with the patient.                            All questions were answered, and informed consent                            was obtained. Prior Anticoagulants: The patient has                            taken no anticoagulant or antiplatelet agents. ASA                            Grade Assessment: III - A patient with severe                            systemic disease. After reviewing the risks and                            benefits, the patient was deemed in satisfactory                            condition to undergo the procedure.  After obtaining informed consent, the endoscope was                            passed under direct vision. Throughout the                            procedure, the patient's blood pressure, pulse, and                            oxygen  saturations were monitored continuously. The                            GIF [id number] #[id number] was introduced through the                            mouth,  and advanced to the second part of duodenum.                            The upper GI endoscopy was accomplished without                            difficulty. The patient tolerated the procedure                            well. Scope In: Scope Out: Findings:                 Grade II varices were found in the lower third of                            the esophagus. They were small in size.                           A 2 cm hiatal hernia was present.                           Evidence of a sleeve gastrectomy was found in the                            gastric body.                           Nodular mucosa was found in the gastric antrum.                           The examined duodenum was normal. Complications:            No immediate complications. Estimated Blood Loss:     Estimated blood loss: none. Impression:               - Grade II, small, esophageal varices.                           - 2 cm hiatal hernia.                           -  A sleeve gastrectomy was found.                           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                           - Normal examined duodenum.                           - No specimens collected. Recommendation:           - Patient has a contact number available for                            emergencies. The signs and symptoms of potential                            delayed complications were discussed with the                            patient. Return to normal activities tomorrow.                            Written discharge instructions were provided to the                            patient.                           - Resume previous diet.                           - Continue present medications.                           - Change vonoprazan back to pantoprazole  based on                            patient preference, 40 mg BID-AC. Continue                            carvedilol  for secondary  prophylaxis of variceal                            hemorrhage.                           - Follow-up with [facility].                           - Repeat upper endoscopy in 1 year for surveillance. Gordy CHRISTELLA Starch, MD 02/25/2024 3:12:54 PM This report has been signed electronically.

## 2024-02-25 NOTE — Patient Instructions (Signed)
 Please read handouts provided. Resume previous diet. Continue present medications. Await pathology results. Change vonoprazan back to pantoprazole , 40 mg twice daily before meals. Continue carvedilol  for secondary prophylaxis of variceal hemorrhage. Follow-up with [facility]. Repeat upper endoscopy in 1 year for screening.    YOU HAD AN ENDOSCOPIC PROCEDURE TODAY AT THE [facility]:   Refer to the procedure report that was given to you for any specific questions about what was found during the examination.  If the procedure report does not answer your questions, please call your gastroenterologist to clarify.  If you requested that your care partner not be given the details of your procedure findings, then the procedure report has been included in a sealed envelope for you to review at your convenience later.  YOU SHOULD EXPECT: Some feelings of bloating in the abdomen. Passage of more gas than usual.  Walking can help get rid of the air that was put into your GI tract during the procedure and reduce the bloating. If you had a lower endoscopy (such as a colonoscopy or flexible sigmoidoscopy) you may notice spotting of blood in your stool or on the toilet paper. If you underwent a bowel prep for your procedure, you may not have a normal bowel movement for a few days.  Please Note:  You might notice some irritation and congestion in your nose or some drainage.  This is from the oxygen  used during your procedure.  There is no need for concern and it should clear up in a day or so.  SYMPTOMS TO REPORT IMMEDIATELY:  Following lower endoscopy (colonoscopy or flexible sigmoidoscopy):  Excessive amounts of blood in the stool  Significant tenderness or worsening of abdominal pains  Swelling of the abdomen that is new, acute  Fever of 100F or higher  Following upper endoscopy (EGD)  Vomiting of blood or coffee ground material  New chest pain or pain under the shoulder  blades  Painful or persistently difficult swallowing  New shortness of breath  Fever of 100F or higher  Black, tarry-looking stools  For urgent or emergent issues, a gastroenterologist can be reached at any hour by calling [phone]. Do not use MyChart messaging for urgent concerns.    DIET:  We do recommend a small meal at first, but then you may proceed to your regular diet.  Drink plenty of fluids but you should avoid alcoholic beverages for 24 hours.  ACTIVITY:  You should plan to take it easy for the rest of today and you should NOT DRIVE or use heavy machinery until tomorrow (because of the sedation medicines used during the test).    FOLLOW UP: Our staff will call the number listed on your records the next business day following your procedure.  We will call around 7:15- 8:00 am to check on you and address any questions or concerns that you may have regarding the information given to you following your procedure. If we do not reach you, we will leave a message.     If any biopsies were taken you will be contacted by phone or by letter within the next 1-3 weeks.  Please call us  at [phone] if you have not heard about the biopsies in 3 weeks.    SIGNATURES/CONFIDENTIALITY: You and/or your care partner have signed paperwork which will be entered into your electronic medical record.  These signatures attest to the fact that that the information above on your After Visit Summary has been reviewed and is understood.  Full responsibility of the confidentiality of this discharge information lies with you and/or your care-partner.

## 2024-02-26 ENCOUNTER — Encounter: Payer: Self-pay | Admitting: Hematology and Oncology

## 2024-02-26 ENCOUNTER — Telehealth: Payer: Self-pay

## 2024-02-26 ENCOUNTER — Other Ambulatory Visit (HOSPITAL_BASED_OUTPATIENT_CLINIC_OR_DEPARTMENT_OTHER): Payer: Self-pay

## 2024-02-26 NOTE — Telephone Encounter (Signed)
  Follow up Call-     02/25/2024    1:31 PM 03/24/2023    1:27 PM  Call back number  Post procedure Call Back phone  # [phone]  Permission to leave phone message Yes Yes    Attempted to call regarding follow-up. No answer, VM left.

## 2024-02-27 ENCOUNTER — Other Ambulatory Visit (HOSPITAL_BASED_OUTPATIENT_CLINIC_OR_DEPARTMENT_OTHER): Payer: Self-pay

## 2024-03-01 ENCOUNTER — Other Ambulatory Visit (HOSPITAL_BASED_OUTPATIENT_CLINIC_OR_DEPARTMENT_OTHER): Payer: Self-pay

## 2024-03-02 ENCOUNTER — Telehealth: Admitting: Physician Assistant

## 2024-03-02 DIAGNOSIS — J069 Acute upper respiratory infection, unspecified: Secondary | ICD-10-CM

## 2024-03-02 LAB — SURGICAL PATHOLOGY

## 2024-03-02 MED ORDER — FLUTICASONE PROPIONATE 50 MCG/ACT NA SUSP: 2.0000 | Freq: Every day | NASAL | 0 refills | Status: DC

## 2024-03-02 MED ORDER — PROMETHAZINE-DM 6.25-15 MG/5ML PO SYRP: 5.0000 mL | ORAL_SOLUTION | Freq: Four times a day (QID) | ORAL | 0 refills | Status: DC | PRN

## 2024-03-02 NOTE — Progress Notes (Signed)
 E-Visit for Upper Respiratory Infection   We are sorry you are not feeling well.  Here is how we plan to help!  Based on what you have shared with me, it looks like you may have a viral upper respiratory infection.  Upper respiratory infections are caused by a large number of viruses; however, rhinovirus is the most common cause.   Symptoms vary from person to person, with common symptoms including sore throat, cough, fatigue or lack of energy and feeling of general discomfort.  A low-grade fever of up to 100.4 may present, but is often uncommon.  Symptoms vary however, and are closely related to a person's age or underlying illnesses.  The most common symptoms associated with an upper respiratory infection are nasal discharge or congestion, cough, sneezing, headache and pressure in the ears and face.  These symptoms usually persist for about 3 to 10 days, but can last up to 2 weeks.  It is important to know that upper respiratory infections do not cause serious illness or complications in most cases.    Upper respiratory infections can be transmitted from person to person, with the most common method of transmission being a person's hands.  The virus is able to live on the skin and can infect other persons for up to 2 hours after direct contact.  Also, these can be transmitted when someone coughs or sneezes; thus, it is important to cover the mouth to reduce this risk.  To keep the spread of the illness at bay, good hand hygiene is very important.  This is an infection that is most likely caused by a virus. There are no specific treatments other than to help you with the symptoms until the infection runs its course.  We are sorry you are not feeling well.  Here is how we plan to help!   For nasal congestion, you may use an oral decongestants such as Mucinex  D or if you have glaucoma or high blood pressure use plain Mucinex .  Saline nasal spray or nasal drops can help and can safely be used as often as  needed for congestion.  For your congestion, I have prescribed Fluticasone  nasal spray one spray in each nostril twice a day  If you do not have a history of heart disease, hypertension, diabetes or thyroid  disease, prostate/bladder issues or glaucoma, you may also use Sudafed to treat nasal congestion.  It is highly recommended that you consult with a pharmacist or your primary care physician to ensure this medication is safe for you to take.     For cough I have prescribed for you Promethazine  DM cough syrup Take 5mL every 6 hours as needed for cough.  If you have a sore or scratchy throat, use a saltwater gargle-  to  teaspoon of salt dissolved in a 4-ounce to 8-ounce glass of warm water.  Gargle the solution for approximately 15-30 seconds and then spit.  It is important not to swallow the solution.  You can also use throat lozenges/cough drops and Chloraseptic spray to help with throat pain or discomfort.  Warm or cold liquids can also be helpful in relieving throat pain.  For headache, pain or general discomfort, you can use Ibuprofen  or Tylenol  as directed.   Some authorities believe that zinc sprays or the use of Echinacea may shorten the course of your symptoms.  I would recommend to take an at home Covid test if not completed yet.   HOME CARE Only take medications as instructed by  your medical team. Be sure to drink plenty of fluids. Water is fine as well as fruit juices, sodas and electrolyte beverages. You may want to stay away from caffeine  or alcohol. If you are nauseated, try taking small sips of liquids. How do you know if you are getting enough fluid? Your urine should be a pale yellow or almost colorless. Get rest. Taking a steamy shower or using a humidifier may help nasal congestion and ease sore throat pain. You can place a towel over your head and breathe in the steam from hot water coming from a faucet. Using a saline nasal spray works much the same way. Cough drops, hard  candies and sore throat lozenges may ease your cough. Avoid close contacts especially the very young and the elderly Cover your mouth if you cough or sneeze Always remember to wash your hands.   GET HELP RIGHT AWAY IF: You develop worsening fever. If your symptoms do not improve within 10 days You develop yellow or green discharge from your nose over 3 days. You have coughing fits You develop a severe head ache or visual changes. You develop shortness of breath, difficulty breathing or start having chest pain Your symptoms persist after you have completed your treatment plan  MAKE SURE YOU  Understand these instructions. Will watch your condition. Will get help right away if you are not doing well or get worse.  Thank you for choosing an e-visit.  Your e-visit answers were reviewed by a board certified advanced clinical practitioner to complete your personal care plan. Depending upon the condition, your plan could have included both over the counter or prescription medications.  Please review your pharmacy choice. Make sure the pharmacy is open so you can pick up prescription now. If there is a problem, you may contact your provider through [employer] and have the prescription routed to another pharmacy.  Your safety is important to us . If you have drug allergies check your prescription carefully.   For the next 24 hours you can use MyChart to ask questions about today's visit, request a non-urgent call back, or ask for a work or school excuse. You will get an email in the next two days asking about your experience. I hope that your e-visit has been valuable and will speed your recovery.    I have spent 5 minutes in review of e-visit questionnaire, review and updating patient chart, medical decision making and response to patient.   Delon CHRISTELLA Dickinson, PA-C

## 2024-03-03 ENCOUNTER — Other Ambulatory Visit (HOSPITAL_BASED_OUTPATIENT_CLINIC_OR_DEPARTMENT_OTHER): Payer: Self-pay

## 2024-03-03 ENCOUNTER — Other Ambulatory Visit: Payer: Self-pay

## 2024-03-04 ENCOUNTER — Other Ambulatory Visit (HOSPITAL_BASED_OUTPATIENT_CLINIC_OR_DEPARTMENT_OTHER): Payer: Self-pay

## 2024-03-07 ENCOUNTER — Other Ambulatory Visit (HOSPITAL_BASED_OUTPATIENT_CLINIC_OR_DEPARTMENT_OTHER): Payer: Self-pay

## 2024-03-09 ENCOUNTER — Other Ambulatory Visit: Payer: Self-pay | Admitting: Family Medicine

## 2024-03-09 ENCOUNTER — Other Ambulatory Visit (HOSPITAL_BASED_OUTPATIENT_CLINIC_OR_DEPARTMENT_OTHER): Payer: Self-pay

## 2024-03-09 DIAGNOSIS — J4540 Moderate persistent asthma, uncomplicated: Secondary | ICD-10-CM

## 2024-03-09 MED ORDER — ALBUTEROL SULFATE HFA 108 (90 BASE) MCG/ACT IN AERS
2.0000 | INHALATION_SPRAY | RESPIRATORY_TRACT | 1 refills | Status: DC | PRN
  Filled 2024-03-09 – 2024-03-23 (×2): qty 18, 17d supply, fill #0

## 2024-03-10 ENCOUNTER — Telehealth: Payer: Self-pay

## 2024-03-10 ENCOUNTER — Inpatient Hospital Stay: Admitting: Hematology and Oncology

## 2024-03-10 ENCOUNTER — Inpatient Hospital Stay: Attending: Hematology and Oncology

## 2024-03-10 NOTE — Assessment & Plan Note (Deleted)
 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 Iron  deficiency anemia IV Iron : Monoferric  Aug 2024, Sept 2024, July 2025   Lab review: 02/04/2018: Platelets 135 07/27/2018: Platelet count 102 01/28/2020: Platelet count 53 02/07/2020: Platelet count 63 05/13/2022: Platelets 87 01/06/2023: Hemoglobin 7, MCV 77.4, platelets 71, WBC 2.6, iron  saturation 2.8%, TIBC 459, ferritin 2.9 01/19/2023: Hemoglobin 7.4, MCV 86, WBC 2.1, platelets 71 (patient received 4 doses of IV iron  so far) 03/02/2023: Hemoglobin 10.4, platelets 68, WBC 2.7, reticulocytes 1.2%, iron  saturation 7%, B12 432, ferritin 8 08/10/23: Hb 13.4, Platelet 70 01/13/24: WBC: 2.1, Hb 11.3, ANC: 1.4, Platelet 59, iron  sat: 8%, Ferritin 14   Previous work-up was negative hepatitis B and C. Hepatitis A showed seropositivity Severe fatigue: Blood work negative for lupus   Neutropenia: 09/09/2023: ANA negative anti-smooth muscle antibody negative, anti-double-stranded DNA antibody negative: Reticulocyte count: 1.4%, ferritin 12, iron  saturation 13%   Treatment plan: Because of cravings for ice chips I recommended that she get IV iron  therapy. Will set this up at the [facility].   No evidence of lupus to explain the cause of the neutropenia. Prior gastric bypass surgery Recheck labs in 4 months and follow-up after that with a telephone visit

## 2024-03-10 NOTE — Telephone Encounter (Signed)
 Called patient back to reschedule her labs and telephone visit per Dr. Gara request. She needs labs a couple days before a phone visit with Dr. Odean. Was unable to reach her and had to leave a voicemail instructing her to call us  back to get her scheduled. Included scheduling in case they receive her phone call. Andrea CHRISTELLA Plunk, RN

## 2024-03-11 ENCOUNTER — Ambulatory Visit: Payer: Self-pay | Admitting: Internal Medicine

## 2024-03-19 ENCOUNTER — Other Ambulatory Visit (HOSPITAL_BASED_OUTPATIENT_CLINIC_OR_DEPARTMENT_OTHER): Payer: Self-pay

## 2024-03-21 ENCOUNTER — Other Ambulatory Visit (HOSPITAL_BASED_OUTPATIENT_CLINIC_OR_DEPARTMENT_OTHER): Payer: Self-pay

## 2024-03-22 ENCOUNTER — Other Ambulatory Visit (HOSPITAL_BASED_OUTPATIENT_CLINIC_OR_DEPARTMENT_OTHER): Payer: Self-pay

## 2024-03-22 ENCOUNTER — Other Ambulatory Visit: Payer: Self-pay

## 2024-03-23 ENCOUNTER — Other Ambulatory Visit (HOSPITAL_BASED_OUTPATIENT_CLINIC_OR_DEPARTMENT_OTHER): Payer: Self-pay

## 2024-03-23 ENCOUNTER — Other Ambulatory Visit: Payer: Self-pay

## 2024-03-25 ENCOUNTER — Other Ambulatory Visit (HOSPITAL_BASED_OUTPATIENT_CLINIC_OR_DEPARTMENT_OTHER): Payer: Self-pay

## 2024-03-25 ENCOUNTER — Other Ambulatory Visit: Payer: Self-pay | Admitting: Genetic Counselor

## 2024-03-25 DIAGNOSIS — Z006 Encounter for examination for normal comparison and control in clinical research program: Secondary | ICD-10-CM

## 2024-04-07 ENCOUNTER — Telehealth: Admitting: Physician Assistant

## 2024-04-07 ENCOUNTER — Other Ambulatory Visit: Payer: Self-pay

## 2024-04-07 ENCOUNTER — Other Ambulatory Visit (HOSPITAL_BASED_OUTPATIENT_CLINIC_OR_DEPARTMENT_OTHER): Payer: Self-pay

## 2024-04-07 DIAGNOSIS — R3989 Other symptoms and signs involving the genitourinary system: Secondary | ICD-10-CM | POA: Diagnosis not present

## 2024-04-07 DIAGNOSIS — B379 Candidiasis, unspecified: Secondary | ICD-10-CM | POA: Diagnosis not present

## 2024-04-07 DIAGNOSIS — T3695XA Adverse effect of unspecified systemic antibiotic, initial encounter: Secondary | ICD-10-CM

## 2024-04-07 MED ORDER — NITROFURANTOIN MONOHYD MACRO 100 MG PO CAPS: 100.0000 mg | ORAL_CAPSULE | Freq: Two times a day (BID) | ORAL | 0 refills | Status: AC

## 2024-04-07 MED ORDER — FLUCONAZOLE 150 MG PO TABS: 150.0000 mg | ORAL_TABLET | Freq: Once | ORAL | 0 refills | Status: AC

## 2024-04-07 NOTE — Progress Notes (Signed)

## 2024-04-07 NOTE — Addendum Note (Signed)
 Addended by: VIVIENNE DELON HERO on: 04/07/2024 10:49 AM   Modules accepted: Orders

## 2024-04-18 ENCOUNTER — Other Ambulatory Visit (HOSPITAL_BASED_OUTPATIENT_CLINIC_OR_DEPARTMENT_OTHER): Payer: Self-pay

## 2024-04-18 ENCOUNTER — Encounter: Payer: Self-pay | Admitting: Radiology

## 2024-04-21 ENCOUNTER — Other Ambulatory Visit (HOSPITAL_BASED_OUTPATIENT_CLINIC_OR_DEPARTMENT_OTHER): Payer: Self-pay

## 2024-04-23 LAB — GENECONNECT MOLECULAR SCREEN: Genetic Analysis Overall Interpretation: NEGATIVE

## 2024-04-24 ENCOUNTER — Telehealth: Admitting: Physician Assistant

## 2024-04-24 DIAGNOSIS — J4540 Moderate persistent asthma, uncomplicated: Secondary | ICD-10-CM | POA: Diagnosis not present

## 2024-04-24 MED ORDER — ALBUTEROL SULFATE HFA 108 (90 BASE) MCG/ACT IN AERS: 2.0000 | INHALATION_SPRAY | RESPIRATORY_TRACT | 1 refills | Status: DC | PRN

## 2024-04-24 NOTE — Progress Notes (Signed)
 E Visit for Asthma  Based on what you have shared with me, it looks like you may have a flare up of your asthma.  Asthma is a chronic (ongoing) lung disease which results in airway obstruction, inflammation and hyper-responsiveness.   Asthma symptoms vary from person to person, with common symptoms including nighttime awakening and decreased ability to participate in normal activities due to shortness of breath. It is often triggered by changes in weather, changes in the season, changes in air temperature, or inside (home, school, daycare or work) allergens such as animal dander, mold, mildew, woodstoves or cockroaches.   It can also be triggered by hormonal changes, extreme emotion, physical exertion or an upper respiratory tract illness.     It is important to identify the trigger and eliminate or avoid the trigger if possible.   If you have been prescribed medications to be taken on a regular basis, it is important to follow the asthma action plan and to follow guidelines to adjust medication in response to increasing symptoms of decreased peak expiratory flow rate.  Treatment: I have prescribed: Albuterol  (Proventil  HFA; Ventolin  HFA) 108 (90 Base) MCG/ACT Inhaler 2 puffs into the lungs every six hours as needed for wheezing or shortness of breath  HOME CARE Only take medications as instructed by your medical team. Consider wearing a mask or scarf to improve breathing when there is poor air quality as this has been shown to decrease irritation and decrease exacerbations Get rest. Using a humidifier may help nasal congestion and ease sore throat pain. Using a saline nasal spray works much the same way.  Cough drops, hard candies and sore throat lozenges may ease your cough.  Avoid close contacts, especially the very you and the elderly. Cover your mouth if you cough or  sneeze. Always remember to wash your hands.   GET HELP RIGHT AWAY IF: You develop worsening shortness of breath/difficulty breathing or chest tightness, breathlessness at rest, drowsy, confused or agitated, unable to speak in full sentences, or if you develop chest pain.  You have coughing fits. You develop a severe headache or visual changes. You develop shortness of breath, difficulty breathing or start having chest pain. Your symptoms persist after you have completed your treatment plan. If your symptoms do not improve within 5 days.  MAKE SURE YOU Understand these instructions. Will watch your condition. Will get help right away if you are not doing well or get worse.   Your e-visit answers were reviewed by a board certified advanced clinical practitioner to complete your personal care plan, Depending upon the condition, your plan could have included both over the counter or prescription medications.  Please review your pharmacy choice. Your safety is important to us . If you have drug allergies check your prescription carefully. You can use MyChart to ask questions about today's visit, request a non-urgent call back, or ask for a work or school excuse for 24 hours related to this e-Visit. If it has been greater than 24 hours you will need to follow up with your provider, or enter a new e-Visit to address those concerns.  You will get an e-mail in the next two days asking about your experience. I hope that your e-visit has been valuable and will speed your recovery. Thank you for using e-visits.  I have spent 5 minutes in review of e-visit questionnaire, review and updating patient chart, medical decision making and response to patient.   Teena Shuck, PA-C

## 2024-04-25 ENCOUNTER — Inpatient Hospital Stay: Attending: Internal Medicine

## 2024-04-26 NOTE — Assessment & Plan Note (Deleted)
 Due to cirrhosis of the liver and splenomegaly   IV Iron : Monoferric  Aug 2024, Sept 2024   Lab review: 02/04/2018: Platelets 135 07/27/2018: Platelet count 102 01/28/2020: Platelet count 53 02/07/2020: Platelet count 63 05/13/2022: Platelets 87 01/06/2023: Hemoglobin 7, MCV 77.4, platelets 71, WBC 2.6, iron  saturation 2.8%, TIBC 459, ferritin 2.9 01/19/2023: Hemoglobin 7.4, MCV 86, WBC 2.1, platelets 71 (patient received 4 doses of IV iron  so far) 03/02/2023: Hemoglobin 10.4, platelets 68, WBC 2.7, reticulocytes 1.2%, iron  saturation 7%, B12 432, ferritin 8 08/10/23: Hb 13.4, Platelet 70 01/13/24: WBC: 2.1, Hb 11.3, ANC: 1.4, Platelet 59, iron  sat: 8%, Ferritin 14   Previous work-up was negative hepatitis B and C. Hepatitis A showed seropositivity Severe fatigue: Blood work negative for lupus   Neutropenia:  09/09/2023: ANA negative anti-smooth muscle antibody negative, anti-double-stranded DNA antibody negative: Reticulocyte count: 1.4%, ferritin 12, iron  saturation 13%   Treatment plan: Because of cravings for ice chips I recommended that she get IV iron  therapy. Will set this up at the [facility].   No evidence of lupus to explain the cause of the neutropenia. Prior gastric bypass surgery Recheck labs in 4 months and follow-up after that with a telephone visit

## 2024-04-27 ENCOUNTER — Telehealth: Admitting: Hematology and Oncology

## 2024-05-06 ENCOUNTER — Other Ambulatory Visit (HOSPITAL_BASED_OUTPATIENT_CLINIC_OR_DEPARTMENT_OTHER): Payer: Self-pay

## 2024-05-06 ENCOUNTER — Other Ambulatory Visit: Payer: Self-pay

## 2024-05-06 ENCOUNTER — Inpatient Hospital Stay

## 2024-05-09 ENCOUNTER — Inpatient Hospital Stay: Admitting: Hematology and Oncology

## 2024-05-09 ENCOUNTER — Telehealth: Payer: Self-pay

## 2024-05-09 DIAGNOSIS — D509 Iron deficiency anemia, unspecified: Secondary | ICD-10-CM

## 2024-05-09 NOTE — Progress Notes (Signed)
 We called and left a voicemail for the patient.  She needs labs before having a telephone appointment with me to discuss the lab results. This encounter was created in error - please disregard.

## 2024-05-09 NOTE — Telephone Encounter (Signed)
 LVM requesting patient to return call to clinic regarding missed lab appointment and telephone visit with Dr. Odean. Call back number provided for return call.

## 2024-05-09 NOTE — Assessment & Plan Note (Signed)
 Due to cirrhosis of the liver and splenomegaly   IV Iron : Monoferric  Aug 2024, Sept 2024   Lab review: 02/04/2018: Platelets 135 07/27/2018: Platelet count 102 01/28/2020: Platelet count 53 02/07/2020: Platelet count 63 05/13/2022: Platelets 87 01/06/2023: Hemoglobin 7, MCV 77.4, platelets 71, WBC 2.6, iron  saturation 2.8%, TIBC 459, ferritin 2.9 01/19/2023: Hemoglobin 7.4, MCV 86, WBC 2.1, platelets 71 (patient received 4 doses of IV iron  so far) 03/02/2023: Hemoglobin 10.4, platelets 68, WBC 2.7, reticulocytes 1.2%, iron  saturation 7%, B12 432, ferritin 8 08/10/23: Hb 13.4, Platelet 70 01/13/24: WBC: 2.1, Hb 11.3, ANC: 1.4, Platelet 59, iron  sat: 8%, Ferritin 14   Previous work-up was negative hepatitis B and C. Hepatitis A showed seropositivity Severe fatigue: Blood work negative for lupus   Neutropenia:  09/09/2023: ANA negative anti-smooth muscle antibody negative, anti-double-stranded DNA antibody negative: Reticulocyte count: 1.4%, ferritin 12, iron  saturation 13%   Treatment plan: Because of cravings for ice chips I recommended that she get IV iron  therapy. Will set this up at the [facility].   No evidence of lupus to explain the cause of the neutropenia. Prior gastric bypass surgery Recheck labs in 4 months and follow-up after that with a telephone visit

## 2024-05-11 ENCOUNTER — Other Ambulatory Visit: Payer: Self-pay | Admitting: Internal Medicine

## 2024-05-11 ENCOUNTER — Other Ambulatory Visit (HOSPITAL_COMMUNITY): Payer: Self-pay

## 2024-05-18 ENCOUNTER — Other Ambulatory Visit (HOSPITAL_BASED_OUTPATIENT_CLINIC_OR_DEPARTMENT_OTHER): Payer: Self-pay

## 2024-05-19 ENCOUNTER — Other Ambulatory Visit (HOSPITAL_BASED_OUTPATIENT_CLINIC_OR_DEPARTMENT_OTHER): Payer: Self-pay

## 2024-05-24 ENCOUNTER — Other Ambulatory Visit: Payer: Self-pay | Admitting: Allergy and Immunology

## 2024-05-24 ENCOUNTER — Other Ambulatory Visit (HOSPITAL_BASED_OUTPATIENT_CLINIC_OR_DEPARTMENT_OTHER): Payer: Self-pay

## 2024-05-24 ENCOUNTER — Other Ambulatory Visit: Payer: Self-pay

## 2024-05-25 ENCOUNTER — Other Ambulatory Visit: Payer: Self-pay

## 2024-05-25 ENCOUNTER — Encounter: Payer: Self-pay | Admitting: Family Medicine

## 2024-05-25 ENCOUNTER — Other Ambulatory Visit: Payer: Self-pay | Admitting: *Deleted

## 2024-05-25 ENCOUNTER — Other Ambulatory Visit (HOSPITAL_BASED_OUTPATIENT_CLINIC_OR_DEPARTMENT_OTHER): Payer: Self-pay

## 2024-05-25 DIAGNOSIS — J4540 Moderate persistent asthma, uncomplicated: Secondary | ICD-10-CM

## 2024-05-25 MED ORDER — ALBUTEROL SULFATE HFA 108 (90 BASE) MCG/ACT IN AERS
2.0000 | INHALATION_SPRAY | RESPIRATORY_TRACT | 0 refills | Status: DC | PRN
  Filled 2024-05-25: qty 18, 17d supply, fill #0

## 2024-06-02 ENCOUNTER — Encounter (HOSPITAL_BASED_OUTPATIENT_CLINIC_OR_DEPARTMENT_OTHER): Payer: Self-pay

## 2024-06-02 ENCOUNTER — Other Ambulatory Visit (HOSPITAL_BASED_OUTPATIENT_CLINIC_OR_DEPARTMENT_OTHER): Payer: Self-pay

## 2024-06-06 ENCOUNTER — Other Ambulatory Visit (HOSPITAL_BASED_OUTPATIENT_CLINIC_OR_DEPARTMENT_OTHER): Payer: Self-pay

## 2024-06-07 ENCOUNTER — Other Ambulatory Visit (HOSPITAL_BASED_OUTPATIENT_CLINIC_OR_DEPARTMENT_OTHER): Payer: Self-pay

## 2024-06-08 ENCOUNTER — Other Ambulatory Visit (HOSPITAL_COMMUNITY): Payer: Self-pay

## 2024-06-08 ENCOUNTER — Other Ambulatory Visit (HOSPITAL_BASED_OUTPATIENT_CLINIC_OR_DEPARTMENT_OTHER): Payer: Self-pay

## 2024-06-13 ENCOUNTER — Other Ambulatory Visit (HOSPITAL_BASED_OUTPATIENT_CLINIC_OR_DEPARTMENT_OTHER): Payer: Self-pay

## 2024-06-14 ENCOUNTER — Other Ambulatory Visit (HOSPITAL_BASED_OUTPATIENT_CLINIC_OR_DEPARTMENT_OTHER): Payer: Self-pay

## 2024-06-18 ENCOUNTER — Other Ambulatory Visit: Payer: Self-pay | Admitting: Nurse Practitioner

## 2024-06-18 ENCOUNTER — Other Ambulatory Visit (HOSPITAL_BASED_OUTPATIENT_CLINIC_OR_DEPARTMENT_OTHER): Payer: Self-pay

## 2024-06-18 ENCOUNTER — Telehealth: Admitting: Nurse Practitioner

## 2024-06-18 DIAGNOSIS — J069 Acute upper respiratory infection, unspecified: Secondary | ICD-10-CM | POA: Diagnosis not present

## 2024-06-18 MED ORDER — LIDOCAINE VISCOUS HCL 2 % MT SOLN: 15.0000 mL | OROMUCOSAL | 0 refills | Status: AC | PRN

## 2024-06-18 MED ORDER — PSEUDOEPH-BROMPHEN-DM 30-2-10 MG/5ML PO SYRP
5.0000 mL | ORAL_SOLUTION | Freq: Four times a day (QID) | ORAL | 0 refills | Status: AC | PRN
  Filled 2024-06-18: qty 240, 12d supply, fill #0

## 2024-06-18 MED ORDER — FLUTICASONE PROPIONATE 50 MCG/ACT NA SUSP: 2.0000 | Freq: Every day | NASAL | 0 refills | Status: AC

## 2024-06-18 MED ORDER — PROMETHAZINE-DM 6.25-15 MG/5ML PO SYRP: 5.0000 mL | ORAL_SOLUTION | Freq: Four times a day (QID) | ORAL | 0 refills | Status: DC | PRN

## 2024-06-18 NOTE — Progress Notes (Signed)

## 2024-06-20 ENCOUNTER — Other Ambulatory Visit: Payer: Self-pay | Admitting: Internal Medicine

## 2024-06-20 ENCOUNTER — Other Ambulatory Visit (HOSPITAL_BASED_OUTPATIENT_CLINIC_OR_DEPARTMENT_OTHER): Payer: Self-pay

## 2024-06-30 ENCOUNTER — Other Ambulatory Visit: Payer: Self-pay

## 2024-06-30 DIAGNOSIS — J4540 Moderate persistent asthma, uncomplicated: Secondary | ICD-10-CM

## 2024-06-30 MED ORDER — ALBUTEROL SULFATE HFA 108 (90 BASE) MCG/ACT IN AERS: 2.0000 | INHALATION_SPRAY | RESPIRATORY_TRACT | 0 refills | Status: AC | PRN

## 2024-07-01 ENCOUNTER — Other Ambulatory Visit (HOSPITAL_BASED_OUTPATIENT_CLINIC_OR_DEPARTMENT_OTHER): Payer: Self-pay

## 2024-07-12 ENCOUNTER — Ambulatory Visit: Admitting: Allergy and Immunology

## 2024-07-20 ENCOUNTER — Encounter: Payer: Self-pay | Admitting: Internal Medicine

## 2024-07-20 ENCOUNTER — Other Ambulatory Visit: Payer: Self-pay
